# Patient Record
Sex: Female | Born: 1950
Health system: Southern US, Community
[De-identification: ages and names within clinical notes are randomized; demographics above are authoritative.]

## PROBLEM LIST (undated history)

## (undated) DIAGNOSIS — Z801 Family history of malignant neoplasm of trachea, bronchus and lung: Secondary | ICD-10-CM

## (undated) DIAGNOSIS — I509 Heart failure, unspecified: Secondary | ICD-10-CM

## (undated) DIAGNOSIS — J189 Pneumonia, unspecified organism: Secondary | ICD-10-CM

## (undated) DIAGNOSIS — J479 Bronchiectasis, uncomplicated: Secondary | ICD-10-CM

## (undated) DIAGNOSIS — Z803 Family history of malignant neoplasm of breast: Secondary | ICD-10-CM

## (undated) DIAGNOSIS — Z1371 Encounter for nonprocreative screening for genetic disease carrier status: Secondary | ICD-10-CM

## (undated) DIAGNOSIS — K5900 Constipation, unspecified: Secondary | ICD-10-CM

## (undated) DIAGNOSIS — Z800 Family history of malignant neoplasm of digestive organs: Secondary | ICD-10-CM

## (undated) DIAGNOSIS — K219 Gastro-esophageal reflux disease without esophagitis: Secondary | ICD-10-CM

## (undated) DIAGNOSIS — Z8489 Family history of other specified conditions: Secondary | ICD-10-CM

## (undated) DIAGNOSIS — C50919 Malignant neoplasm of unspecified site of unspecified female breast: Secondary | ICD-10-CM

## (undated) DIAGNOSIS — D649 Anemia, unspecified: Secondary | ICD-10-CM

## (undated) DIAGNOSIS — A64 Unspecified sexually transmitted disease: Secondary | ICD-10-CM

## (undated) DIAGNOSIS — M810 Age-related osteoporosis without current pathological fracture: Secondary | ICD-10-CM

## (undated) DIAGNOSIS — J439 Emphysema, unspecified: Secondary | ICD-10-CM

## (undated) DIAGNOSIS — J449 Chronic obstructive pulmonary disease, unspecified: Secondary | ICD-10-CM

## (undated) DIAGNOSIS — A310 Pulmonary mycobacterial infection: Secondary | ICD-10-CM

## (undated) DIAGNOSIS — I10 Essential (primary) hypertension: Secondary | ICD-10-CM

## (undated) DIAGNOSIS — C3490 Malignant neoplasm of unspecified part of unspecified bronchus or lung: Secondary | ICD-10-CM

## (undated) DIAGNOSIS — Z85118 Personal history of other malignant neoplasm of bronchus and lung: Secondary | ICD-10-CM

## (undated) HISTORY — DX: Encounter for nonprocreative screening for genetic disease carrier status: Z13.71

## (undated) HISTORY — PX: TONSILLECTOMY: SUR1361

## (undated) HISTORY — DX: Personal history of other malignant neoplasm of bronchus and lung: Z85.118

## (undated) HISTORY — DX: Anemia, unspecified: D64.9

## (undated) HISTORY — DX: Malignant neoplasm of unspecified part of unspecified bronchus or lung: C34.90

## (undated) HISTORY — DX: Pulmonary mycobacterial infection: A31.0

## (undated) HISTORY — PX: BUNIONECTOMY: SHX129

## (undated) HISTORY — DX: Gastro-esophageal reflux disease without esophagitis: K21.9

## (undated) HISTORY — DX: Malignant neoplasm of unspecified site of unspecified female breast: C50.919

## (undated) HISTORY — DX: Chronic obstructive pulmonary disease, unspecified: J44.9

## (undated) HISTORY — DX: Emphysema, unspecified: J43.9

## (undated) HISTORY — DX: Family history of malignant neoplasm of digestive organs: Z80.0

## (undated) HISTORY — PX: BREAST BIOPSY: SHX20

## (undated) HISTORY — DX: Unspecified sexually transmitted disease: A64

## (undated) HISTORY — DX: Family history of malignant neoplasm of trachea, bronchus and lung: Z80.1

## (undated) HISTORY — DX: Family history of malignant neoplasm of breast: Z80.3

## (undated) HISTORY — DX: Age-related osteoporosis without current pathological fracture: M81.0

## (undated) HISTORY — PX: COLONOSCOPY: SHX174

---
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
## 1982-07-11 HISTORY — PX: TUBAL LIGATION: SHX77

## 1999-06-07 ENCOUNTER — Other Ambulatory Visit: Admission: RE | Admit: 1999-06-07 | Discharge: 1999-06-07 | Payer: Self-pay | Admitting: Obstetrics and Gynecology

## 2000-06-08 ENCOUNTER — Other Ambulatory Visit: Admission: RE | Admit: 2000-06-08 | Discharge: 2000-06-08 | Payer: Self-pay | Admitting: Obstetrics and Gynecology

## 2001-06-04 ENCOUNTER — Other Ambulatory Visit: Admission: RE | Admit: 2001-06-04 | Discharge: 2001-06-04 | Payer: Self-pay | Admitting: Obstetrics and Gynecology

## 2002-05-06 ENCOUNTER — Encounter: Admission: RE | Admit: 2002-05-06 | Discharge: 2002-05-06 | Payer: Self-pay | Admitting: Internal Medicine

## 2002-05-06 ENCOUNTER — Encounter: Payer: Self-pay | Admitting: Internal Medicine

## 2002-06-10 ENCOUNTER — Other Ambulatory Visit: Admission: RE | Admit: 2002-06-10 | Discharge: 2002-06-10 | Payer: Self-pay | Admitting: Obstetrics and Gynecology

## 2002-09-13 ENCOUNTER — Encounter: Payer: Self-pay | Admitting: Internal Medicine

## 2002-09-13 ENCOUNTER — Encounter: Admission: RE | Admit: 2002-09-13 | Discharge: 2002-09-13 | Payer: Self-pay | Admitting: Internal Medicine

## 2002-09-19 ENCOUNTER — Encounter: Payer: Self-pay | Admitting: Internal Medicine

## 2002-09-19 ENCOUNTER — Encounter (HOSPITAL_COMMUNITY): Admission: RE | Admit: 2002-09-19 | Discharge: 2002-12-18 | Payer: Self-pay | Admitting: Internal Medicine

## 2002-09-20 ENCOUNTER — Encounter: Payer: Self-pay | Admitting: Internal Medicine

## 2003-03-19 ENCOUNTER — Encounter: Admission: RE | Admit: 2003-03-19 | Discharge: 2003-03-19 | Payer: Self-pay | Admitting: Internal Medicine

## 2003-03-19 ENCOUNTER — Encounter: Payer: Self-pay | Admitting: Internal Medicine

## 2003-04-08 ENCOUNTER — Encounter: Admission: RE | Admit: 2003-04-08 | Discharge: 2003-04-08 | Payer: Self-pay | Admitting: Internal Medicine

## 2003-04-08 ENCOUNTER — Encounter: Payer: Self-pay | Admitting: Internal Medicine

## 2003-06-12 ENCOUNTER — Other Ambulatory Visit: Admission: RE | Admit: 2003-06-12 | Discharge: 2003-06-12 | Payer: Self-pay | Admitting: Obstetrics and Gynecology

## 2003-09-10 ENCOUNTER — Encounter: Admission: RE | Admit: 2003-09-10 | Discharge: 2003-09-10 | Payer: Self-pay | Admitting: Internal Medicine

## 2004-06-17 ENCOUNTER — Other Ambulatory Visit: Admission: RE | Admit: 2004-06-17 | Discharge: 2004-06-17 | Payer: Self-pay | Admitting: *Deleted

## 2004-09-17 ENCOUNTER — Encounter: Admission: RE | Admit: 2004-09-17 | Discharge: 2004-09-17 | Payer: Self-pay | Admitting: Internal Medicine

## 2005-04-25 ENCOUNTER — Encounter: Admission: RE | Admit: 2005-04-25 | Discharge: 2005-04-25 | Payer: Self-pay | Admitting: Internal Medicine

## 2005-04-27 ENCOUNTER — Encounter: Admission: RE | Admit: 2005-04-27 | Discharge: 2005-04-27 | Payer: Self-pay | Admitting: Internal Medicine

## 2005-05-06 ENCOUNTER — Ambulatory Visit (HOSPITAL_COMMUNITY): Admission: RE | Admit: 2005-05-06 | Discharge: 2005-05-06 | Payer: Self-pay | Admitting: Internal Medicine

## 2005-06-06 DIAGNOSIS — C3490 Malignant neoplasm of unspecified part of unspecified bronchus or lung: Secondary | ICD-10-CM

## 2005-06-06 HISTORY — DX: Malignant neoplasm of unspecified part of unspecified bronchus or lung: C34.90

## 2005-06-06 HISTORY — PX: LOBECTOMY: SHX5089

## 2005-07-01 ENCOUNTER — Encounter: Admission: RE | Admit: 2005-07-01 | Discharge: 2005-07-01 | Payer: Self-pay | Admitting: Internal Medicine

## 2005-07-06 ENCOUNTER — Other Ambulatory Visit: Admission: RE | Admit: 2005-07-06 | Discharge: 2005-07-06 | Payer: Self-pay | Admitting: Obstetrics and Gynecology

## 2005-07-22 ENCOUNTER — Ambulatory Visit: Payer: Self-pay | Admitting: Oncology

## 2005-07-27 ENCOUNTER — Encounter: Admission: RE | Admit: 2005-07-27 | Discharge: 2005-07-27 | Payer: Self-pay | Admitting: Oncology

## 2005-08-23 ENCOUNTER — Encounter: Admission: RE | Admit: 2005-08-23 | Discharge: 2005-08-23 | Payer: Self-pay | Admitting: Internal Medicine

## 2005-09-20 ENCOUNTER — Encounter: Admission: RE | Admit: 2005-09-20 | Discharge: 2005-09-20 | Payer: Self-pay | Admitting: Internal Medicine

## 2005-10-31 ENCOUNTER — Ambulatory Visit: Payer: Self-pay | Admitting: Oncology

## 2005-11-03 ENCOUNTER — Ambulatory Visit (HOSPITAL_COMMUNITY): Admission: RE | Admit: 2005-11-03 | Discharge: 2005-11-03 | Payer: Self-pay | Admitting: Oncology

## 2006-01-25 ENCOUNTER — Ambulatory Visit: Payer: Self-pay | Admitting: Oncology

## 2006-01-26 LAB — CBC WITH DIFFERENTIAL (CANCER CENTER ONLY)
BASO#: 0 10*3/uL (ref 0.0–0.2)
BASO%: 0.4 % (ref 0.0–2.0)
EOS%: 1.3 % (ref 0.0–7.0)
HCT: 38.3 % (ref 34.8–46.6)
HGB: 12.9 g/dL (ref 11.6–15.9)
LYMPH#: 1 10*3/uL (ref 0.9–3.3)
LYMPH%: 22.1 % (ref 14.0–48.0)
MCH: 30.3 pg (ref 26.0–34.0)
MCHC: 33.6 g/dL (ref 32.0–36.0)
MCV: 90 fL (ref 81–101)
MONO%: 5.8 % (ref 0.0–13.0)
NEUT%: 70.4 % (ref 39.6–80.0)
RDW: 12.1 % (ref 10.5–14.6)

## 2006-01-26 LAB — COMPREHENSIVE METABOLIC PANEL WITH GFR
ALT: 17 U/L (ref 0–40)
AST: 22 U/L (ref 0–37)
Albumin: 4.4 g/dL (ref 3.5–5.2)
Alkaline Phosphatase: 50 U/L (ref 39–117)
BUN: 14 mg/dL (ref 6–23)
CO2: 26 meq/L (ref 19–32)
Calcium: 9.8 mg/dL (ref 8.4–10.5)
Chloride: 101 meq/L (ref 96–112)
Creatinine, Ser: 0.69 mg/dL (ref 0.40–1.20)
Glucose, Bld: 100 mg/dL — ABNORMAL HIGH (ref 70–99)
Potassium: 4.3 meq/L (ref 3.5–5.3)
Sodium: 136 meq/L (ref 135–145)
Total Bilirubin: 0.5 mg/dL (ref 0.3–1.2)
Total Protein: 6.8 g/dL (ref 6.0–8.3)

## 2006-01-26 LAB — LACTATE DEHYDROGENASE: LDH: 144 U/L (ref 94–250)

## 2006-04-20 ENCOUNTER — Encounter: Admission: RE | Admit: 2006-04-20 | Discharge: 2006-04-20 | Payer: Self-pay | Admitting: Oncology

## 2006-06-22 ENCOUNTER — Encounter: Admission: RE | Admit: 2006-06-22 | Discharge: 2006-06-22 | Payer: Self-pay | Admitting: Internal Medicine

## 2006-07-06 ENCOUNTER — Encounter: Admission: RE | Admit: 2006-07-06 | Discharge: 2006-07-06 | Payer: Self-pay | Admitting: Internal Medicine

## 2006-07-14 ENCOUNTER — Other Ambulatory Visit: Admission: RE | Admit: 2006-07-14 | Discharge: 2006-07-14 | Payer: Self-pay | Admitting: Obstetrics & Gynecology

## 2006-07-20 ENCOUNTER — Encounter: Admission: RE | Admit: 2006-07-20 | Discharge: 2006-07-20 | Payer: Self-pay | Admitting: Internal Medicine

## 2006-07-25 ENCOUNTER — Ambulatory Visit: Payer: Self-pay | Admitting: Oncology

## 2006-08-17 ENCOUNTER — Encounter: Admission: RE | Admit: 2006-08-17 | Discharge: 2006-08-17 | Payer: Self-pay | Admitting: Internal Medicine

## 2006-09-08 ENCOUNTER — Ambulatory Visit: Payer: Self-pay | Admitting: Oncology

## 2007-02-23 ENCOUNTER — Encounter: Admission: RE | Admit: 2007-02-23 | Discharge: 2007-02-23 | Payer: Self-pay | Admitting: Internal Medicine

## 2007-03-05 ENCOUNTER — Ambulatory Visit: Payer: Self-pay | Admitting: Oncology

## 2007-03-06 LAB — COMPREHENSIVE METABOLIC PANEL
ALT: 15 U/L (ref 0–35)
AST: 23 U/L (ref 0–37)
BUN: 15 mg/dL (ref 6–23)
CO2: 26 mEq/L (ref 19–32)
Creatinine, Ser: 0.68 mg/dL (ref 0.40–1.20)
Total Bilirubin: 0.6 mg/dL (ref 0.3–1.2)

## 2007-03-06 LAB — CBC WITH DIFFERENTIAL (CANCER CENTER ONLY)
BASO#: 0 10*3/uL (ref 0.0–0.2)
BASO%: 0.4 % (ref 0.0–2.0)
EOS%: 6.7 % (ref 0.0–7.0)
LYMPH#: 1.5 10*3/uL (ref 0.9–3.3)
MCH: 30.9 pg (ref 26.0–34.0)
MCHC: 34.5 g/dL (ref 32.0–36.0)
MONO%: 8.2 % (ref 0.0–13.0)
NEUT#: 1.6 10*3/uL (ref 1.5–6.5)
Platelets: 246 10*3/uL (ref 145–400)
RDW: 11.3 % (ref 10.5–14.6)

## 2007-03-08 ENCOUNTER — Ambulatory Visit (HOSPITAL_COMMUNITY): Admission: RE | Admit: 2007-03-08 | Discharge: 2007-03-08 | Payer: Self-pay | Admitting: Oncology

## 2007-03-16 ENCOUNTER — Ambulatory Visit (HOSPITAL_COMMUNITY): Admission: RE | Admit: 2007-03-16 | Discharge: 2007-03-16 | Payer: Self-pay | Admitting: Oncology

## 2007-04-26 ENCOUNTER — Encounter: Admission: RE | Admit: 2007-04-26 | Discharge: 2007-04-26 | Payer: Self-pay | Admitting: Obstetrics and Gynecology

## 2007-07-17 ENCOUNTER — Other Ambulatory Visit: Admission: RE | Admit: 2007-07-17 | Discharge: 2007-07-17 | Payer: Self-pay | Admitting: Obstetrics and Gynecology

## 2008-03-13 ENCOUNTER — Ambulatory Visit: Payer: Self-pay | Admitting: Oncology

## 2008-03-18 LAB — CBC WITH DIFFERENTIAL (CANCER CENTER ONLY)
BASO#: 0 10*3/uL (ref 0.0–0.2)
BASO%: 0.2 % (ref 0.0–2.0)
HCT: 35.7 % (ref 34.8–46.6)
HGB: 12.3 g/dL (ref 11.6–15.9)
LYMPH%: 31.9 % (ref 14.0–48.0)
MCV: 91 fL (ref 81–101)
MONO#: 0.3 10*3/uL (ref 0.1–0.9)
NEUT%: 51.7 % (ref 39.6–80.0)
RDW: 11.5 % (ref 10.5–14.6)
WBC: 3.6 10*3/uL — ABNORMAL LOW (ref 3.9–10.0)

## 2008-03-18 LAB — COMPREHENSIVE METABOLIC PANEL
BUN: 18 mg/dL (ref 6–23)
CO2: 29 mEq/L (ref 19–32)
Calcium: 9.9 mg/dL (ref 8.4–10.5)
Chloride: 101 mEq/L (ref 96–112)
Creatinine, Ser: 0.66 mg/dL (ref 0.40–1.20)
Glucose, Bld: 97 mg/dL (ref 70–99)
Total Bilirubin: 0.5 mg/dL (ref 0.3–1.2)

## 2008-03-18 LAB — LACTATE DEHYDROGENASE: LDH: 154 U/L (ref 94–250)

## 2008-03-20 ENCOUNTER — Ambulatory Visit (HOSPITAL_COMMUNITY): Admission: RE | Admit: 2008-03-20 | Discharge: 2008-03-20 | Payer: Self-pay | Admitting: Oncology

## 2008-04-28 ENCOUNTER — Encounter: Admission: RE | Admit: 2008-04-28 | Discharge: 2008-04-28 | Payer: Self-pay | Admitting: Obstetrics and Gynecology

## 2008-05-08 ENCOUNTER — Ambulatory Visit: Payer: Self-pay | Admitting: Internal Medicine

## 2008-06-26 ENCOUNTER — Ambulatory Visit: Payer: Self-pay | Admitting: Internal Medicine

## 2008-08-05 ENCOUNTER — Other Ambulatory Visit: Admission: RE | Admit: 2008-08-05 | Discharge: 2008-08-05 | Payer: Self-pay | Admitting: Obstetrics & Gynecology

## 2008-09-18 ENCOUNTER — Ambulatory Visit: Payer: Self-pay | Admitting: Internal Medicine

## 2009-04-29 ENCOUNTER — Encounter: Admission: RE | Admit: 2009-04-29 | Discharge: 2009-04-29 | Payer: Self-pay | Admitting: Obstetrics and Gynecology

## 2009-05-06 ENCOUNTER — Ambulatory Visit: Payer: Self-pay | Admitting: Internal Medicine

## 2009-06-22 ENCOUNTER — Encounter: Admission: RE | Admit: 2009-06-22 | Discharge: 2009-06-22 | Payer: Self-pay | Admitting: Internal Medicine

## 2009-07-06 ENCOUNTER — Encounter: Admission: RE | Admit: 2009-07-06 | Discharge: 2009-07-06 | Payer: Self-pay | Admitting: Internal Medicine

## 2009-07-06 ENCOUNTER — Ambulatory Visit: Payer: Self-pay | Admitting: Internal Medicine

## 2009-07-11 DIAGNOSIS — Z1371 Encounter for nonprocreative screening for genetic disease carrier status: Secondary | ICD-10-CM

## 2009-07-11 HISTORY — DX: Encounter for nonprocreative screening for genetic disease carrier status: Z13.71

## 2009-08-11 DIAGNOSIS — C50919 Malignant neoplasm of unspecified site of unspecified female breast: Secondary | ICD-10-CM

## 2009-08-11 HISTORY — DX: Malignant neoplasm of unspecified site of unspecified female breast: C50.919

## 2009-09-02 ENCOUNTER — Encounter: Admission: RE | Admit: 2009-09-02 | Discharge: 2009-09-02 | Payer: Self-pay | Admitting: Obstetrics & Gynecology

## 2009-09-09 ENCOUNTER — Encounter: Admission: RE | Admit: 2009-09-09 | Discharge: 2009-09-09 | Payer: Self-pay | Admitting: Obstetrics & Gynecology

## 2009-09-17 ENCOUNTER — Encounter: Admission: RE | Admit: 2009-09-17 | Discharge: 2009-09-17 | Payer: Self-pay | Admitting: Surgery

## 2009-09-25 ENCOUNTER — Ambulatory Visit: Payer: Self-pay | Admitting: Oncology

## 2009-09-29 ENCOUNTER — Ambulatory Visit: Payer: Self-pay | Admitting: Internal Medicine

## 2009-09-30 LAB — CBC WITH DIFFERENTIAL (CANCER CENTER ONLY)
BASO#: 0 10*3/uL (ref 0.0–0.2)
BASO%: 0.3 % (ref 0.0–2.0)
EOS%: 5.8 % (ref 0.0–7.0)
Eosinophils Absolute: 0.2 10*3/uL (ref 0.0–0.5)
MCH: 31.3 pg (ref 26.0–34.0)
MCV: 90 fL (ref 81–101)
Platelets: 228 10*3/uL (ref 145–400)
RDW: 11.8 % (ref 10.5–14.6)

## 2009-09-30 LAB — CMP (CANCER CENTER ONLY)
AST: 26 U/L (ref 11–38)
BUN, Bld: 12 mg/dL (ref 7–22)
CO2: 28 mEq/L (ref 18–33)
Calcium: 9.8 mg/dL (ref 8.0–10.3)
Chloride: 100 mEq/L (ref 98–108)
Sodium: 135 mEq/L (ref 128–145)

## 2009-10-12 ENCOUNTER — Encounter: Admission: RE | Admit: 2009-10-12 | Discharge: 2009-10-12 | Payer: Self-pay | Admitting: Surgery

## 2009-10-12 ENCOUNTER — Ambulatory Visit (HOSPITAL_BASED_OUTPATIENT_CLINIC_OR_DEPARTMENT_OTHER): Admission: RE | Admit: 2009-10-12 | Discharge: 2009-10-12 | Payer: Self-pay | Admitting: Surgery

## 2009-10-12 HISTORY — PX: BREAST LUMPECTOMY: SHX2

## 2009-10-16 ENCOUNTER — Ambulatory Visit: Admission: RE | Admit: 2009-10-16 | Discharge: 2010-01-14 | Payer: Self-pay | Admitting: Radiation Oncology

## 2009-10-28 ENCOUNTER — Ambulatory Visit: Payer: Self-pay | Admitting: Oncology

## 2009-11-03 LAB — CMP (CANCER CENTER ONLY)
Calcium: 9.6 mg/dL (ref 8.0–10.3)
Creat: 0.7 mg/dl (ref 0.6–1.2)
Potassium: 4.6 mEq/L (ref 3.3–4.7)
Sodium: 137 mEq/L (ref 128–145)
Total Bilirubin: 0.7 mg/dl (ref 0.20–1.60)

## 2009-11-03 LAB — CBC WITH DIFFERENTIAL (CANCER CENTER ONLY)
BASO#: 0 10*3/uL (ref 0.0–0.2)
BASO%: 0.5 % (ref 0.0–2.0)
HGB: 12.1 g/dL (ref 11.6–15.9)
LYMPH#: 1.3 10*3/uL (ref 0.9–3.3)
LYMPH%: 34.2 % (ref 14.0–48.0)
MCHC: 34.8 g/dL (ref 32.0–36.0)
MCV: 90 fL (ref 81–101)
MONO#: 0.2 10*3/uL (ref 0.1–0.9)
MONO%: 5.8 % (ref 0.0–13.0)
NEUT#: 1.9 10*3/uL (ref 1.5–6.5)
NEUT%: 52.5 % (ref 39.6–80.0)
RDW: 12.2 % (ref 10.5–14.6)

## 2009-11-20 ENCOUNTER — Ambulatory Visit: Payer: Self-pay | Admitting: Internal Medicine

## 2010-01-14 ENCOUNTER — Ambulatory Visit: Admission: RE | Admit: 2010-01-14 | Discharge: 2010-01-31 | Payer: Self-pay | Admitting: Radiation Oncology

## 2010-01-29 ENCOUNTER — Ambulatory Visit: Payer: Self-pay | Admitting: Oncology

## 2010-02-03 LAB — CMP (CANCER CENTER ONLY)
ALT(SGPT): 22 U/L (ref 10–47)
AST: 25 U/L (ref 11–38)
Alkaline Phosphatase: 67 U/L (ref 26–84)
BUN, Bld: 14 mg/dL (ref 7–22)
Creat: 0.8 mg/dl (ref 0.6–1.2)
Potassium: 4.7 mEq/L (ref 3.3–4.7)

## 2010-02-03 LAB — CBC WITH DIFFERENTIAL (CANCER CENTER ONLY)
BASO%: 0.6 % (ref 0.0–2.0)
EOS%: 2.9 % (ref 0.0–7.0)
HCT: 35.1 % (ref 34.8–46.6)
HGB: 12.2 g/dL (ref 11.6–15.9)
LYMPH#: 0.7 10*3/uL — ABNORMAL LOW (ref 0.9–3.3)
MCH: 31.8 pg (ref 26.0–34.0)
MCV: 91 fL (ref 81–101)
MONO%: 9.6 % (ref 0.0–13.0)
NEUT#: 1.9 10*3/uL (ref 1.5–6.5)
NEUT%: 62.6 % (ref 39.6–80.0)
RBC: 3.85 10*6/uL (ref 3.70–5.32)
RDW: 12.1 % (ref 10.5–14.6)
WBC: 3 10*3/uL — ABNORMAL LOW (ref 3.9–10.0)

## 2010-02-22 ENCOUNTER — Ambulatory Visit: Payer: Self-pay | Admitting: Internal Medicine

## 2010-03-17 ENCOUNTER — Ambulatory Visit: Payer: Self-pay | Admitting: Oncology

## 2010-03-23 LAB — MANUAL DIFFERENTIAL (CHCC SATELLITE)
ALC: 0.7 10*3/uL (ref 0.6–2.2)
ANC (CHCC HP manual diff): 1.3 10*3/uL — ABNORMAL LOW (ref 1.5–6.7)
Band Neutrophils: 2 % (ref 0–10)
LYMPH: 26 % (ref 14–48)
Metamyelocytes: 1 % — ABNORMAL HIGH (ref 0–0)
SEG: 48 % (ref 40–75)

## 2010-03-23 LAB — CBC WITH DIFFERENTIAL (CANCER CENTER ONLY)
HCT: 38.3 % (ref 34.8–46.6)
HGB: 12.9 g/dL (ref 11.6–15.9)
MCH: 31.4 pg (ref 26.0–34.0)
MCHC: 33.8 g/dL (ref 32.0–36.0)
MCV: 93 fL (ref 81–101)
Platelets: 223 10*3/uL (ref 145–400)
WBC: 2.6 10*3/uL — ABNORMAL LOW (ref 3.9–10.0)

## 2010-03-23 LAB — CMP (CANCER CENTER ONLY)
ALT(SGPT): 26 U/L (ref 10–47)
Albumin: 4 g/dL (ref 3.3–5.5)
CO2: 29 mEq/L (ref 18–33)
Calcium: 9.7 mg/dL (ref 8.0–10.3)
Chloride: 99 mEq/L (ref 98–108)
Creat: 0.7 mg/dl (ref 0.6–1.2)
Glucose, Bld: 95 mg/dL (ref 73–118)
Sodium: 137 mEq/L (ref 128–145)

## 2010-04-15 ENCOUNTER — Ambulatory Visit: Payer: Self-pay | Admitting: Internal Medicine

## 2010-06-10 ENCOUNTER — Ambulatory Visit: Payer: Self-pay | Admitting: Internal Medicine

## 2010-06-10 ENCOUNTER — Encounter: Admission: RE | Admit: 2010-06-10 | Discharge: 2010-06-10 | Payer: Self-pay | Admitting: Internal Medicine

## 2010-06-28 ENCOUNTER — Encounter
Admission: RE | Admit: 2010-06-28 | Discharge: 2010-06-28 | Payer: Self-pay | Source: Home / Self Care | Attending: Internal Medicine | Admitting: Internal Medicine

## 2010-07-21 ENCOUNTER — Encounter: Payer: Self-pay | Admitting: Internal Medicine

## 2010-07-31 ENCOUNTER — Other Ambulatory Visit: Payer: Self-pay | Admitting: Internal Medicine

## 2010-07-31 DIAGNOSIS — Z9889 Other specified postprocedural states: Secondary | ICD-10-CM

## 2010-08-01 ENCOUNTER — Encounter: Payer: Self-pay | Admitting: Oncology

## 2010-08-01 ENCOUNTER — Encounter: Payer: Self-pay | Admitting: Internal Medicine

## 2010-08-12 NOTE — Letter (Signed)
Summary: Colonoscopy Letter   Gastroenterology  [address] [place], [place] [address]   Phone: [phone]  Fax: [phone]      July 21, 2010 MRN: [id number]   [facility] [address] [place], [place]  [address]   Dear Anna Valentine,   According to your medical record, it is time for you to schedule a Colonoscopy. The American Cancer Society recommends this procedure as a method to detect early colon cancer. Patients with a family history of colon cancer, or a personal history of colon polyps or inflammatory bowel disease are at increased risk.  This letter has been generated based on the recommendations made at the time of your procedure. If you feel that in your particular situation this may no longer apply, please contact our office.  Please call our office at [phone] to schedule this appointment or to update your records at your earliest convenience.  Thank you for cooperating with [place] to provide you with the very best care possible.   Sincerely,  Hedwig Morton. Juanda Chance, M.D.  [facility]astro[facility]ology [facility] [phone]

## 2010-08-26 ENCOUNTER — Encounter (INDEPENDENT_AMBULATORY_CARE_PROVIDER_SITE_OTHER): Payer: Self-pay | Admitting: *Deleted

## 2010-08-26 ENCOUNTER — Other Ambulatory Visit: Payer: Self-pay | Admitting: Internal Medicine

## 2010-08-26 ENCOUNTER — Ambulatory Visit: Payer: BC Managed Care – PPO | Attending: Radiation Oncology | Admitting: Radiation Oncology

## 2010-08-27 ENCOUNTER — Ambulatory Visit: Payer: Self-pay | Admitting: Internal Medicine

## 2010-08-27 ENCOUNTER — Encounter: Payer: Self-pay | Admitting: Internal Medicine

## 2010-08-30 ENCOUNTER — Ambulatory Visit (INDEPENDENT_AMBULATORY_CARE_PROVIDER_SITE_OTHER): Payer: BC Managed Care – PPO | Admitting: Internal Medicine

## 2010-08-30 DIAGNOSIS — E785 Hyperlipidemia, unspecified: Secondary | ICD-10-CM

## 2010-08-30 DIAGNOSIS — J069 Acute upper respiratory infection, unspecified: Secondary | ICD-10-CM

## 2010-09-01 NOTE — Letter (Signed)
Summary: Miralax Instructions  [facility]  [address]   [place], [place] [address]   Phone: [phone]  Fax: [phone]       [facility]    Nov 15, 1950    MRN: [id number]       Procedure Day Dorna Bloom: Friday, 09-10-10     Arrival Time: 7:30 a.m.     Procedure Time: 8:00 a.m.     Location of Procedure:                    x   Endoscopy Center (4th Floor)   PREPARATION FOR COLONOSCOPY WITH MIRALAX  Starting 5 days prior to your procedure 09-05-10 do not eat nuts, seeds, popcorn, corn, beans, peas,  salads, or any raw vegetables.  Do not take any fiber supplements (e.g. Metamucil, Citrucel, and Benefiber). ____________________________________________________________________________________________________   THE DAY BEFORE YOUR PROCEDURE         DATE: 09-09-10 DAY: Thursday  1   Drink clear liquids the entire day-NO SOLID FOOD  2   Do not drink anything colored red or purple.  Avoid juices with pulp.  No orange juice.  3   Drink at least 64 oz. (8 glasses) of fluid/clear liquids during the day to prevent dehydration and help the prep work efficiently.  CLEAR LIQUIDS INCLUDE: Water Jello Ice Popsicles Tea (sugar ok, no milk/cream) Powdered fruit flavored drinks Coffee (sugar ok, no milk/cream) Gatorade Juice: apple, white grape, white cranberry  Lemonade Clear bullion, consomm, broth Carbonated beverages (any kind) Strained chicken noodle soup Hard Candy  4   Mix the entire bottle of Miralax with 64 oz. of Gatorade/Powerade in the morning and put in the refrigerator to chill.  5   At 3:00 pm take 2 Dulcolax/Bisacodyl tablets.  6   At 4:30 pm take one Reglan/Metoclopramide tablet.  7  Starting at 5:00 pm drink one 8 oz glass of the Miralax mixture every 15-20 minutes until you have finished drinking the entire 64 oz.  You should finish drinking prep around 7:30 or 8:00 pm.  8   If you are nauseated, you may take the 2nd Reglan/Metoclopramide  tablet at 6:30 pm.        9    At 8:00 pm take 2 more DULCOLAX/Bisacodyl tablets.   THE DAY OF YOUR PROCEDURE      DATE:  09-10-10 DAY: Friday  You may drink clear liquids until 6:00 a.m. (2 HOURS BEFORE PROCEDURE).   MEDICATION INSTRUCTIONS  Unless otherwise instructed, you should take regular prescription medications with a small sip of water as early as possible the morning of your procedure.         OTHER INSTRUCTIONS  You will need a responsible adult at least 59 years of age to accompany you and drive you home.   This person must remain in the waiting room during your procedure.  Wear loose fitting clothing that is easily removed.  Leave jewelry and other valuables at home.  However, you may wish to bring a book to read or an iPod/MP3 player to listen to music as you wait for your procedure to start.  Remove all body piercing jewelry and leave at home.  Total time from sign-in until discharge is approximately 2-3 hours.  You should go home directly after your procedure and rest.  You can resume normal activities the day after your procedure.  The day of your procedure you should not:   Drive   Make legal decisions   Operate  machinery   Drink alcohol   Return to work  You will receive specific instructions about eating, activities and medications before you leave.   The above instructions have been reviewed and explained to me by   Durwin Glaze RN  August 27, 2010 2:23 PM     I fully understand and can verbalize these instructions _____________________________ Date _______

## 2010-09-01 NOTE — Miscellaneous (Signed)
Summary: lec pv/kco  Clinical Lists Changes  Medications: Added new medication of MIRALAX   POWD (POLYETHYLENE GLYCOL 3350) As per prep  instructions. - Signed Added new medication of REGLAN 10 MG  TABS (METOCLOPRAMIDE HCL) As per prep instructions. - Signed Added new medication of DULCOLAX 5 MG  TBEC (BISACODYL) Day before procedure take 2 at 3pm and 2 at 8pm. - Signed Rx of MIRALAX   POWD (POLYETHYLENE GLYCOL 3350) As per prep  instructions.;  #255gm x 0;  Signed;  Entered by: Durwin Glaze RN;  Authorized by: Hart Carwin MD;  Method used: Electronically to [facility] [address] [place], [place], [place]  [address], Ph: [phone], Fax: [phone] Rx of REGLAN 10 MG  TABS (METOCLOPRAMIDE HCL) As per prep instructions.;  #2 x 0;  Signed;  Entered by: Durwin Glaze RN;  Authorized by: Hart Carwin MD;  Method used: Electronically to [facility] [address] [place], [place], [place]  [address], Ph: [phone], Fax: [phone] Rx of DULCOLAX 5 MG  TBEC (BISACODYL) Day before procedure take 2 at 3pm and 2 at 8pm.;  #4 x 0;  Signed;  Entered by: Durwin Glaze RN;  Authorized by: Hart Carwin MD;  Method used: Electronically to [facility] [address], [place], [place], [place]  [address], Ph: [phone], Fax: [phone] Allergies: Added new allergy or adverse reaction of * CLARITHROMYCIN Observations: Added new observation of NKA: F (08/27/2010 14:03)    Prescriptions: DULCOLAX 5 MG  TBEC (BISACODYL) Day before procedure take 2 at 3pm and 2 at 8pm.  #4 x 0   Entered by:   Durwin Glaze RN   Authorized by:   Hart Carwin MD   Signed by:   Durwin Glaze RN on 08/27/2010   Method used:   Electronically to        [facility] ([facility])       [address]       [place], [place]  [address]       Ph: [phone]       Fax: [phone]   RxID:   [id number] REGLAN 10 MG  TABS  (METOCLOPRAMIDE HCL) As per prep instructions.  #2 x 0   Entered by:   Durwin Glaze RN   Authorized by:   Hart Carwin MD   Signed by:   Durwin Glaze RN on 08/27/2010   Method used:   Electronically to        [facility] ([facility])       [address]       [place], [place]  [address]       Ph: [phone]       Fax: [phone]   RxID:   [id number] MIRALAX   POWD (POLYETHYLENE GLYCOL 3350) As per prep  instructions.  #255gm x 0   Entered by:   Durwin Glaze RN   Authorized by:   Hart Carwin MD   Signed by:   Durwin Glaze RN on 08/27/2010   Method used:   Electronically to        [facility] ([facility])       [address]       [place], [place]  [address]       Ph: [phone]  Fax: [phone]   RxID:   [id number]

## 2010-09-06 ENCOUNTER — Ambulatory Visit
Admission: RE | Admit: 2010-09-06 | Discharge: 2010-09-06 | Disposition: A | Discharge status: Home / Self Care | Payer: BC Managed Care – PPO | Source: Ambulatory Visit | Attending: Internal Medicine | Admitting: Internal Medicine

## 2010-09-06 ENCOUNTER — Other Ambulatory Visit: Payer: Self-pay | Admitting: Internal Medicine

## 2010-09-06 DIAGNOSIS — Z9889 Other specified postprocedural states: Secondary | ICD-10-CM

## 2010-09-10 ENCOUNTER — Other Ambulatory Visit (AMBULATORY_SURGERY_CENTER): Payer: BC Managed Care – PPO | Admitting: Internal Medicine

## 2010-09-10 ENCOUNTER — Encounter: Payer: Self-pay | Admitting: Internal Medicine

## 2010-09-10 DIAGNOSIS — Z1211 Encounter for screening for malignant neoplasm of colon: Secondary | ICD-10-CM

## 2010-09-10 DIAGNOSIS — K5730 Diverticulosis of large intestine without perforation or abscess without bleeding: Secondary | ICD-10-CM

## 2010-09-10 DIAGNOSIS — Z800 Family history of malignant neoplasm of digestive organs: Secondary | ICD-10-CM

## 2010-09-16 NOTE — Procedures (Addendum)
Summary: Colonoscopy  Patient: [name] Note: All result statuses are Final unless otherwise noted.  Tests: (1) Colonoscopy (COL)   COL Colonoscopy           DONE      Endoscopy Center     [address]     [place], [place]  [address]          COLONOSCOPY PROCEDURE REPORT          PATIENT:  Anna Valentine, Anna Valentine  MR#:  [id number]     BIRTHDATE:  13-Jul-1950, 60 yrs. old  GENDER:  female     ENDOSCOPIST:  Hedwig Morton. Juanda Chance, MD     REF. BY:  Sharlet Salina, M.D.     PROCEDURE DATE:  09/10/2010     PROCEDURE:  Colonoscopy 78469     ASA CLASS:  Class II     INDICATIONS:  family history of colon cancer father with colon     cancer,     personal hx of lung cancer and breast cancer     last colon 2005 ( 1995, 1999)     MEDICATIONS:   Versed 10 mg, Fentanyl 100 mcg, Benadryl 12.5 mg          DESCRIPTION OF PROCEDURE:   After the risks benefits and     alternatives of the procedure were thoroughly explained, informed     consent was obtained.  Digital rectal exam was performed and     revealed no rectal masses.   The LB [id number]-[id number] endoscope     was introduced through the anus and advanced to the cecum, which     was identified by both the appendix and ileocecal valve, without     limitations.  The quality of the prep was good, using MiraLax.     The instrument was then slowly withdrawn as the colon was fully     examined.     <<PROCEDUREIMAGES>>          FINDINGS:  Mild diverticulosis was found. minimal diverticulosis     This was otherwise a normal examination of the colon (see image4,     image3, image2, and image1).   Retroflexed views in the rectum     revealed no abnormalities.    The scope was then withdrawn from     the patient and the procedure completed.          COMPLICATIONS:  None     ENDOSCOPIC IMPRESSION:     1) Mild diverticulosis     2) Otherwise normal examination     3) Normal colonoscopy     RECOMMENDATIONS:     1) high fiber diet     REPEAT  EXAM:  In 5 year(s) for.          ______________________________     Hedwig Morton. Juanda Chance, MD          CC:          n.     eSIGNED:   Hedwig Morton. Pantelis Elgersma at 09/10/2010 08:44 AM          Clarisa Fling, [id number]  Note: An exclamation mark (!) indicates a result that was not dispersed into the flowsheet. Document Creation Date: 09/10/2010 8:44 AM _______________________________________________________________________  (1) Order result status: Final Collection or observation date-time: 09/10/2010 08:36 Requested date-time:  Receipt date-time:  Reported date-time:  Referring Physician:   Ordering Physician: Lina Sar [phone]) Specimen Source:  Source: Launa Grill Order Number: [id number] Lab site:  Appended Document: Colonoscopy    Clinical Lists Changes  Observations: Added new observation of COLONNXTDUE: 09/2015 (09/10/2010 12:16)

## 2010-09-21 ENCOUNTER — Other Ambulatory Visit: Payer: Self-pay | Admitting: Surgery

## 2010-09-21 DIAGNOSIS — R922 Inconclusive mammogram: Secondary | ICD-10-CM

## 2010-09-21 DIAGNOSIS — Z9889 Other specified postprocedural states: Secondary | ICD-10-CM

## 2010-09-29 LAB — POCT HEMOGLOBIN-HEMACUE: Hemoglobin: 13.4 g/dL (ref 12.0–15.0)

## 2010-10-01 ENCOUNTER — Other Ambulatory Visit: Payer: BC Managed Care – PPO

## 2010-10-02 ENCOUNTER — Ambulatory Visit
Admission: RE | Admit: 2010-10-02 | Discharge: 2010-10-02 | Disposition: A | Discharge status: Home / Self Care | Payer: BC Managed Care – PPO | Source: Ambulatory Visit | Attending: Surgery | Admitting: Surgery

## 2010-10-02 DIAGNOSIS — R922 Inconclusive mammogram: Secondary | ICD-10-CM

## 2010-10-02 DIAGNOSIS — Z9889 Other specified postprocedural states: Secondary | ICD-10-CM

## 2010-10-02 MED ORDER — GADOBENATE DIMEGLUMINE 529 MG/ML IV SOLN
11.0000 mL | Freq: Once | INTRAVENOUS | Status: AC | PRN
  Administered 2010-10-02: 11 mL via INTRAVENOUS

## 2010-10-04 LAB — DIFFERENTIAL
Eosinophils Absolute: 0.3 10*3/uL (ref 0.0–0.7)
Lymphs Abs: 1.3 10*3/uL (ref 0.7–4.0)
Monocytes Relative: 10 % (ref 3–12)
Neutrophils Relative %: 50 % (ref 43–77)

## 2010-10-04 LAB — BASIC METABOLIC PANEL
Chloride: 102 mEq/L (ref 96–112)
GFR calc Af Amer: >60 mL/min (ref >60)
Potassium: 3.7 mEq/L (ref 3.5–5.1)
Sodium: 136 mEq/L (ref 135–145)

## 2010-10-04 LAB — CBC
HCT: 36.9 % (ref 36.0–46.0)
Hemoglobin: 12.4 g/dL (ref 12.0–15.0)
MCV: 94.3 fL (ref 78.0–100.0)
RBC: 3.91 MIL/uL (ref 3.87–5.11)
WBC: 4.1 10*3/uL (ref 4.0–10.5)

## 2010-10-22 ENCOUNTER — Ambulatory Visit (INDEPENDENT_AMBULATORY_CARE_PROVIDER_SITE_OTHER): Payer: BC Managed Care – PPO | Admitting: Internal Medicine

## 2010-10-22 DIAGNOSIS — Z85118 Personal history of other malignant neoplasm of bronchus and lung: Secondary | ICD-10-CM

## 2010-10-22 DIAGNOSIS — J479 Bronchiectasis, uncomplicated: Secondary | ICD-10-CM

## 2010-11-15 ENCOUNTER — Encounter: Payer: Self-pay | Admitting: Pulmonary Disease

## 2010-11-15 ENCOUNTER — Ambulatory Visit (INDEPENDENT_AMBULATORY_CARE_PROVIDER_SITE_OTHER): Payer: BC Managed Care – PPO | Admitting: Pulmonary Disease

## 2010-11-15 ENCOUNTER — Other Ambulatory Visit: Payer: BC Managed Care – PPO

## 2010-11-15 DIAGNOSIS — R05 Cough: Secondary | ICD-10-CM | POA: Insufficient documentation

## 2010-11-15 DIAGNOSIS — J479 Bronchiectasis, uncomplicated: Secondary | ICD-10-CM

## 2010-11-15 DIAGNOSIS — R059 Cough, unspecified: Secondary | ICD-10-CM

## 2010-11-15 DIAGNOSIS — C50919 Malignant neoplasm of unspecified site of unspecified female breast: Secondary | ICD-10-CM

## 2010-11-15 NOTE — Progress Notes (Signed)
  Subjective:    Patient ID: Anna Valentine, female    DOB: 04/04/1951, 60 y.o.   MRN: [id number]  HPI The pt is a 60 y/o female who I have been asked to see for an abnormal ct chest.  She has a h/o stage 1 lung cancer that has been followed at [facility], and a recent ct chest showed bronchiectasis on right with scarring and pleural thickening bilat.  There were also centrilobular nodules and tree in bud opacities suggestive of bacterial or atypical colonization.  The pt describes a cough that started last fall after a "cold".  She has been treated with multiple rounds of abx, with only some improvement.  The cough is primarily dry, with occasionally discolored mucus.  She describes "rattling in throat" at times.  She has been having a lot of postnasal drip, but denies reflux symptoms.  She denies dyspnea issues, stating that she has no problem with ADL's, and has stable tolerance with her running.  It is interesting that she lived in [place], and her lung biopsy in 2006 at the time of her lobectomy also showed necrotizing granulomatous inflammation.  She denies any h/o histoplamosis or TB exposure.  Her PPD has been negative in the distant past.  She denies any early family h/o emphysema.    Review of Systems  Constitutional: Negative for fever and unexpected weight change.  HENT: Positive for sneezing. Negative for ear pain, nosebleeds, congestion, sore throat, rhinorrhea, trouble swallowing, dental problem, postnasal drip and sinus pressure.   Eyes: Negative for redness and itching.  Respiratory: Positive for cough and shortness of breath. Negative for chest tightness and wheezing.   Cardiovascular: Negative for palpitations and leg swelling.  Gastrointestinal: Negative for nausea and vomiting.  Genitourinary: Negative for dysuria.  Musculoskeletal: Negative for joint swelling.  Skin: Negative for rash.  Neurological: Negative for headaches.  Hematological: Does not bruise/bleed easily.    Psychiatric/Behavioral: Negative for dysphoric mood. The patient is not nervous/anxious.        Objective:   Physical Exam Constitutional:  Well developed, no acute distress  HENT:  Nares patent without discharge  Oropharynx without exudate, palate and uvula are normal  Eyes:  Perrla, eomi, no scleral icterus  Neck:  No JVD, no TMG  Cardiovascular:  Normal rate, regular rhythm, no rubs or gallops.  No murmurs        Intact distal pulses  Pulmonary :  Decreased bs on right, no stridor or respiratory distress   No rales, rhonchi, or wheezing  Abdominal:  Soft, nondistended, bowel sounds present.  No tenderness noted.   Musculoskeletal:  No lower extremity edema noted.  Lymph Nodes:  No cervical lymphadenopathy noted  Skin:  No cyanosis noted  Neurologic:  Alert, appropriate, moves all 4 extremities without obvious deficit.         Assessment & Plan:

## 2010-11-15 NOTE — Patient Instructions (Signed)
Will schedule for breathing studies, and will see you back same day for review Take chlorpheniramine 8mg  otc each night at bedtime Qnasal 2 sprays each nostril each am Avoid throat clearing and overuse of voice

## 2010-11-16 ENCOUNTER — Encounter (INDEPENDENT_AMBULATORY_CARE_PROVIDER_SITE_OTHER): Payer: Self-pay | Admitting: Surgery

## 2010-11-21 ENCOUNTER — Encounter: Payer: Self-pay | Admitting: Pulmonary Disease

## 2010-11-21 DIAGNOSIS — Z853 Personal history of malignant neoplasm of breast: Secondary | ICD-10-CM | POA: Insufficient documentation

## 2010-11-21 NOTE — Assessment & Plan Note (Signed)
The pt has bronchiectasis on her recent and past ct chest, but really does not have any chronic cough or mucus/congestion until most recently.  She denies recurrent pulmonary infections.  It is unclear if her current cough is even related to this finding, but will need to keep in mind.  The tree in bud nodularity is usually associated with chronic bacterial or atypical mycobacterial colonization.   I think she needs to have pfts to r/o airflow obstruction that is frequently associated with bronchiectasis, and will also check AAT given her chronic lung disease.  Will treat her cough as an upper airway issue for now, but will readdress once her pfts are done.

## 2010-11-21 NOTE — Assessment & Plan Note (Signed)
A lot of her symptoms currently suggest an upper airway cough > lower airway source.  She is having a lot of postnasal drip, and would like to treat for this aggressively while we are waiting on her to get full pft's.  She is agreeable to this approach.

## 2010-11-23 ENCOUNTER — Other Ambulatory Visit: Payer: Self-pay | Admitting: Oncology

## 2010-11-23 ENCOUNTER — Encounter (HOSPITAL_BASED_OUTPATIENT_CLINIC_OR_DEPARTMENT_OTHER): Payer: BC Managed Care – PPO | Admitting: Oncology

## 2010-11-23 DIAGNOSIS — C50219 Malignant neoplasm of upper-inner quadrant of unspecified female breast: Secondary | ICD-10-CM

## 2010-11-23 DIAGNOSIS — C3490 Malignant neoplasm of unspecified part of unspecified bronchus or lung: Secondary | ICD-10-CM

## 2010-11-23 DIAGNOSIS — Z170 Estrogen receptor positive status [ER+]: Secondary | ICD-10-CM

## 2010-11-23 LAB — CBC WITH DIFFERENTIAL/PLATELET
BASO%: 0.4 % (ref 0.0–2.0)
HCT: 34.9 % (ref 34.8–46.6)
MCHC: 34 g/dL (ref 31.5–36.0)
MONO#: 0.4 10*3/uL (ref 0.1–0.9)
RBC: 3.88 10*6/uL (ref 3.70–5.45)
RDW: 13.6 % (ref 11.2–14.5)
WBC: 4.2 10*3/uL (ref 3.9–10.3)
lymph#: 0.7 10*3/uL — ABNORMAL LOW (ref 0.9–3.3)

## 2010-11-23 LAB — COMPREHENSIVE METABOLIC PANEL
ALT: 12 U/L (ref 0–35)
CO2: 25 mEq/L (ref 19–32)
Calcium: 9.5 mg/dL (ref 8.4–10.5)
Chloride: 100 mEq/L (ref 96–112)
Creatinine, Ser: 0.64 mg/dL (ref 0.40–1.20)
Total Protein: 6.5 g/dL (ref 6.0–8.3)

## 2010-11-24 ENCOUNTER — Telehealth: Payer: Self-pay | Admitting: Pulmonary Disease

## 2010-11-24 LAB — ALPHA-1 ANTITRYPSIN PHENOTYPE

## 2010-11-24 NOTE — Telephone Encounter (Signed)
Called and spoke with Brad from [facility] and was informed that the Alpha 1 is still pending. The results are not ready yet.

## 2010-11-24 NOTE — Telephone Encounter (Signed)
Pt aware results are not back yet and we would call her as soon as they are and KC reviews them. Pt states the technician advised her it would only take the most was 5 days to get results back. I advised her it can take up to 2 weeks to get these back. She verbalized understanding and nothing further was needed

## 2010-11-24 NOTE — Telephone Encounter (Signed)
Called, spoke with pt.  She is requesting results of alpha 1 test done on 11/15/10.  Looks like the results are still pending in epic.  Aundra Millet, will you pls call [facility] to see if the results are available yet?  Thank you!

## 2010-11-25 ENCOUNTER — Encounter: Payer: Self-pay | Admitting: Pulmonary Disease

## 2010-11-30 ENCOUNTER — Encounter: Payer: Self-pay | Admitting: Pulmonary Disease

## 2010-12-08 ENCOUNTER — Ambulatory Visit (INDEPENDENT_AMBULATORY_CARE_PROVIDER_SITE_OTHER): Payer: BC Managed Care – PPO | Admitting: Pulmonary Disease

## 2010-12-08 ENCOUNTER — Encounter: Payer: Self-pay | Admitting: Pulmonary Disease

## 2010-12-08 DIAGNOSIS — J479 Bronchiectasis, uncomplicated: Secondary | ICD-10-CM

## 2010-12-08 DIAGNOSIS — R05 Cough: Secondary | ICD-10-CM

## 2010-12-08 LAB — PULMONARY FUNCTION TEST

## 2010-12-08 NOTE — Assessment & Plan Note (Signed)
The pt continues to have a cough that sounds more upper airway in nature, and she is still noting ongoing postnasal drip despite nasal ICS and an antihistamine.  Will try her on astelin for a few weeks and see if she sees a difference.  I cannot exclude the possibility of MAC colonization contributing to her symptoms, but she is not coughing up any significant mucus or noting chest congestion.

## 2010-12-08 NOTE — Patient Instructions (Signed)
Stop Qnase nasal spray Start astepro 2 sprays each nostril am and afternoon for first week, then only in am thereafter Continue chlorpheniramine at bedtime, but the 4mg  tab. Please call me in 2 weeks to report back on response to med changes Don't forget to avoid throat clearing and to use hard candy if needed.

## 2010-12-08 NOTE — Progress Notes (Signed)
  Subjective:    Patient ID: Anna Valentine, female    DOB: April 03, 1951, 60 y.o.   MRN: [id number]  HPI The pt comes in today for review of her PFT's.  She was found to have no obstruction, no restriction, and her DLCO was normal as well.  She has known bronchiectasis, but denies any chest congestion or purulent mucus.  She does still have a dry cough that sounds more upper airway than anything else, and she is continuing to have postnasal drip despite a trial of nasal ICS.     Review of Systems  Constitutional: Negative for fever and unexpected weight change.  HENT: Negative for ear pain, nosebleeds, congestion, sore throat, rhinorrhea, sneezing, trouble swallowing, dental problem, postnasal drip and sinus pressure.   Eyes: Negative for redness and itching.  Respiratory: Positive for cough and shortness of breath. Negative for chest tightness and wheezing.   Cardiovascular: Negative for palpitations and leg swelling.  Gastrointestinal: Negative for nausea and vomiting.  Genitourinary: Negative for dysuria.  Musculoskeletal: Negative for joint swelling.  Skin: Negative for rash.  Neurological: Negative for headaches.  Hematological: Does not bruise/bleed easily.  Psychiatric/Behavioral: Negative for dysphoric mood. The patient is not nervous/anxious.        Objective:   Physical Exam Thin female in nad Chest totally clear to auscultation, no wheezing Cor with rrr LE without edema, no cyanosis Alert, oriented, moves all 4        Assessment & Plan:

## 2010-12-08 NOTE — Progress Notes (Signed)
PFT done today. 

## 2010-12-11 ENCOUNTER — Encounter: Payer: Self-pay | Admitting: Pulmonary Disease

## 2010-12-22 ENCOUNTER — Telehealth: Payer: Self-pay | Admitting: Pulmonary Disease

## 2010-12-22 NOTE — Telephone Encounter (Signed)
Spoke with pt. She is calling to give update on cough and drainage. She states that her symptoms are "slightly improved" but states not much difference at all. She states that is still taking chlortrimeton at hs and astepro in the am.  Would like further recs from [facility]. She is aware he is out of the office until 12/27/10. KC, pls advise thanks!

## 2010-12-27 NOTE — Telephone Encounter (Signed)
Pt needs ov to evaluate this.

## 2010-12-28 NOTE — Telephone Encounter (Signed)
Pt set to see KC on 01-10-11 at 3:15 pt aware. Carron Curie, CMA

## 2011-01-03 ENCOUNTER — Telehealth: Payer: Self-pay | Admitting: Internal Medicine

## 2011-01-03 NOTE — Telephone Encounter (Signed)
If she has had a recent tdap vaccine, then she is covered. Please check her chart.

## 2011-01-03 NOTE — Telephone Encounter (Signed)
Patient is covered.  Let patient know.

## 2011-01-06 ENCOUNTER — Encounter: Payer: Self-pay | Admitting: Pulmonary Disease

## 2011-01-10 ENCOUNTER — Ambulatory Visit (INDEPENDENT_AMBULATORY_CARE_PROVIDER_SITE_OTHER): Payer: BC Managed Care – PPO | Admitting: Pulmonary Disease

## 2011-01-10 ENCOUNTER — Encounter: Payer: Self-pay | Admitting: Pulmonary Disease

## 2011-01-10 DIAGNOSIS — R05 Cough: Secondary | ICD-10-CM

## 2011-01-10 DIAGNOSIS — R059 Cough, unspecified: Secondary | ICD-10-CM

## 2011-01-10 DIAGNOSIS — J479 Bronchiectasis, uncomplicated: Secondary | ICD-10-CM

## 2011-01-10 NOTE — Progress Notes (Signed)
  Subjective:    Patient ID: Anna Valentine, female    DOB: 02-14-1951, 60 y.o.   MRN: [id number]  HPI The pt comes in today for f/u of her ongoing cough.  She has known bronchiectasis, but has also been having significant postnasal drip.  She has not improved with nasal ICS or astepro, and continues to have postnasal drip.  Her cough is now productive of purulent appearing mucus.  She denies any purulence from her nares, or significant sinus pressure.  She denies chest congestion or worsening sob.    Review of Systems  Constitutional: Negative for fever and unexpected weight change.  HENT: Positive for postnasal drip. Negative for ear pain, nosebleeds, congestion, sore throat, rhinorrhea, sneezing, trouble swallowing, dental problem and sinus pressure.   Eyes: Negative for redness and itching.  Respiratory: Positive for cough. Negative for chest tightness, shortness of breath and wheezing.   Cardiovascular: Negative for palpitations and leg swelling.  Gastrointestinal: Negative for nausea and vomiting.  Genitourinary: Negative for dysuria.  Musculoskeletal: Negative for joint swelling.  Skin: Negative for rash.  Neurological: Negative for headaches.  Hematological: Does not bruise/bleed easily.  Psychiatric/Behavioral: Negative for dysphoric mood. The patient is not nervous/anxious.        Objective:   Physical Exam Thin female in nad Nares patent without discharge or purulence.  Deviated septum to left Op clear Chest totally clear to auscultation Cor with rrr LE without edema, no cyanosis  Alert and oriented, moves all 4        Assessment & Plan:

## 2011-01-10 NOTE — Patient Instructions (Signed)
Will set up for scan of your sinuses to evaluate for chronic sinusitis.  Will call you with results Please cough up sputum specimen in sterile cup, and bring to lab downstairs asap.

## 2011-01-13 NOTE — Assessment & Plan Note (Signed)
The pt has continued to have cough with purulent mucus, and it is unclear if this is coming from her upper or lower airway.  Pts with bronchiectasis often have chronic sinus disease, so will check scan of her sinuses.  Will also check sputum CS and AFB smear and culture (to evaluate for MAC).

## 2011-01-19 ENCOUNTER — Ambulatory Visit (INDEPENDENT_AMBULATORY_CARE_PROVIDER_SITE_OTHER)
Admission: RE | Admit: 2011-01-19 | Discharge: 2011-01-19 | Disposition: A | Discharge status: Home / Self Care | Payer: BC Managed Care – PPO | Source: Ambulatory Visit | Attending: Pulmonary Disease | Admitting: Pulmonary Disease

## 2011-01-19 DIAGNOSIS — R05 Cough: Secondary | ICD-10-CM

## 2011-01-31 ENCOUNTER — Encounter: Payer: Self-pay | Admitting: Internal Medicine

## 2011-01-31 ENCOUNTER — Ambulatory Visit (INDEPENDENT_AMBULATORY_CARE_PROVIDER_SITE_OTHER): Payer: BC Managed Care – PPO | Admitting: Internal Medicine

## 2011-01-31 VITALS — BP 136/84 | HR 76 | Temp 98.4°F | Ht 66.25 in | Wt 122.0 lb

## 2011-01-31 DIAGNOSIS — L237 Allergic contact dermatitis due to plants, except food: Secondary | ICD-10-CM

## 2011-01-31 DIAGNOSIS — L255 Unspecified contact dermatitis due to plants, except food: Secondary | ICD-10-CM

## 2011-01-31 NOTE — Patient Instructions (Signed)
Take Sterapred DS 10 mg 12  dosepak as directed

## 2011-01-31 NOTE — Progress Notes (Signed)
  Subjective:    Patient ID: Anna Valentine, female    DOB: 1951/02/09, 60 y.o.   MRN: [id number]  HPI patient in today with several day history of itchy vesicular lesions on the arms and legs after doing yard work consistent with contact dermatitis. Says lesions seem to be spreading more of the past couple of days.    Review of Systems     Objective:   Physical Exam linear erythematous vesicular papular lesions on arms and legs consistent with poison ivy        Assessment & Plan:  Poison ivy  Plan: Prescribed Sterapred DS 10 mg 12 day dosepak. Take with food as directed.

## 2011-02-02 ENCOUNTER — Telehealth: Payer: Self-pay | Admitting: Pulmonary Disease

## 2011-02-02 NOTE — Telephone Encounter (Signed)
Called and spoke with pt.  Pt wanted to give [facility] an FYI.  Pt states her cough has "quieted down" and she is unable to get sputum up for culture.  Pt states she wishes to just hold off for now since her cough has improved and she is feeling better.  Pt states she will call us back if her cough returns and she is able to get up mucus.  Will forward message to [facility] as an [place]

## 2011-02-02 NOTE — Telephone Encounter (Signed)
LM with family member for pt TCB 

## 2011-02-02 NOTE — Telephone Encounter (Signed)
Returning call.

## 2011-02-08 NOTE — Telephone Encounter (Signed)
Noted  

## 2011-02-28 ENCOUNTER — Other Ambulatory Visit: Payer: BC Managed Care – PPO | Admitting: Internal Medicine

## 2011-02-28 DIAGNOSIS — Z0000 Encounter for general adult medical examination without abnormal findings: Secondary | ICD-10-CM

## 2011-02-28 LAB — COMPREHENSIVE METABOLIC PANEL
AST: 23 U/L (ref 0–37)
Alkaline Phosphatase: 73 U/L (ref 39–117)
BUN: 21 mg/dL (ref 6–23)
Calcium: 9.3 mg/dL (ref 8.4–10.5)
Chloride: 106 mEq/L (ref 96–112)
Creat: 0.65 mg/dL (ref 0.50–1.10)
Glucose, Bld: 82 mg/dL (ref 70–99)

## 2011-02-28 LAB — CBC WITH DIFFERENTIAL/PLATELET
Basophils Relative: 0 % (ref 0–1)
Eosinophils Absolute: 0.1 10*3/uL (ref 0.0–0.7)
MCH: 30.6 pg (ref 26.0–34.0)
MCHC: 32.5 g/dL (ref 30.0–36.0)
Neutrophils Relative %: 62 % (ref 43–77)
Platelets: 220 10*3/uL (ref 150–400)

## 2011-03-01 ENCOUNTER — Ambulatory Visit (INDEPENDENT_AMBULATORY_CARE_PROVIDER_SITE_OTHER): Payer: BC Managed Care – PPO | Admitting: Internal Medicine

## 2011-03-01 ENCOUNTER — Encounter: Payer: Self-pay | Admitting: Internal Medicine

## 2011-03-01 VITALS — BP 140/82 | HR 76 | Temp 98.3°F | Ht 66.25 in | Wt 122.0 lb

## 2011-03-01 DIAGNOSIS — F411 Generalized anxiety disorder: Secondary | ICD-10-CM

## 2011-03-01 DIAGNOSIS — Z862 Personal history of diseases of the blood and blood-forming organs and certain disorders involving the immune mechanism: Secondary | ICD-10-CM

## 2011-03-01 DIAGNOSIS — Z800 Family history of malignant neoplasm of digestive organs: Secondary | ICD-10-CM

## 2011-03-01 DIAGNOSIS — Z85118 Personal history of other malignant neoplasm of bronchus and lung: Secondary | ICD-10-CM

## 2011-03-01 DIAGNOSIS — Z853 Personal history of malignant neoplasm of breast: Secondary | ICD-10-CM

## 2011-03-01 DIAGNOSIS — F419 Anxiety disorder, unspecified: Secondary | ICD-10-CM

## 2011-03-01 DIAGNOSIS — J479 Bronchiectasis, uncomplicated: Secondary | ICD-10-CM

## 2011-03-01 DIAGNOSIS — Z0000 Encounter for general adult medical examination without abnormal findings: Secondary | ICD-10-CM

## 2011-03-01 LAB — LIPID PANEL
HDL: 80 mg/dL (ref >39)
Total CHOL/HDL Ratio: 2.6 Ratio
Triglycerides: 47 mg/dL (ref <150)

## 2011-03-01 LAB — POCT URINALYSIS DIPSTICK
Blood, UA: NEGATIVE
Ketones, UA: NEGATIVE
Leukocytes, UA: NEGATIVE
Protein, UA: NEGATIVE
Spec Grav, UA: 1.005
pH, UA: 7

## 2011-03-01 LAB — VITAMIN D 25 HYDROXY (VIT D DEFICIENCY, FRACTURES): Vit D, 25-Hydroxy: 42 ng/mL (ref 30–89)

## 2011-03-11 DIAGNOSIS — Z800 Family history of malignant neoplasm of digestive organs: Secondary | ICD-10-CM | POA: Insufficient documentation

## 2011-03-11 DIAGNOSIS — Z862 Personal history of diseases of the blood and blood-forming organs and certain disorders involving the immune mechanism: Secondary | ICD-10-CM | POA: Insufficient documentation

## 2011-03-11 DIAGNOSIS — Z85118 Personal history of other malignant neoplasm of bronchus and lung: Secondary | ICD-10-CM | POA: Insufficient documentation

## 2011-03-11 DIAGNOSIS — F419 Anxiety disorder, unspecified: Secondary | ICD-10-CM | POA: Insufficient documentation

## 2011-03-11 NOTE — Progress Notes (Signed)
Subjective:    Patient ID: Anna Valentine, female    DOB: 05-17-1951, 60 y.o.   MRN: [id number]  HPI and pleasant 60 year old white female with complicated past medical history. In November 2006 she was diagnosed with a 1.2 cm poorly differentiated adenocarcinoma of the lung with negative lymph nodes. In February 2011 she was diagnosed with breast cancer and underwent lumpectomy of the left breast with a diagnosis of lobular carcinoma. Sentinel node biopsy showed one of 4 lymph nodes positive for invasive lobular carcinoma. She was clinical stage II. Tumor was ER positive, PR positive, HER-2/ neu negative. Was placed on Arava dextra five-year starting July 2011. She had radiation therapy completed July 2011. History of osteopenia. 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 History of anal fissure 1996, history of right lower lobe pneumonia December 2007. Last mammogram February 2012.  Bilateral tubal ligation 1984, left bunionectomy 1990, right bunionectomy 1992. Right lower lobectomy at [facility] November 2006. Pneumovax immunization 2008, influenza immunization October 2011, tetanus immunization 2004. Last colonoscopy 2012. GYN is Shirlyn Goltz, nurse practitioner. Has taken Actonel, Fosamax, and Evista in the past but has discontinued all of those. Biaxin causes hives. There is a family history of breast cancer in her mother and sister and family history of colorectal cancer. Father died of colon cancer at age 68. Mother died of metastatic lung cancer at age 77. One brother with died of prostate cancer. One brother in good health. Patient is married. 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 No children.  She smoked 1-1/2-2 packs of cigarettes a day for some 15 years but quit around 1991. No alcohol consumption. Completed 4 years of college.  Interestingly, mother had double mastectomies in her 53s but in December 1992 was found to have  lung cancer presumably a new cancer not related to breast cancer. Mother developed metastatic disease in her brain and passed away. One brother and one sister have hypertension. Mother also had hypertension    Review of Systems  Constitutional: Negative.   HENT: Negative.   Eyes: Negative.   Respiratory: Positive for chest tightness.   Cardiovascular: Negative.   Gastrointestinal: Negative.   Genitourinary: Negative.   Musculoskeletal: Negative.   Neurological: Negative.   Hematological: Negative.   Psychiatric/Behavioral:       Anxiety       Objective:   Physical Exam  Vitals reviewed. Constitutional: She is oriented to person, place, and time. She appears well-nourished.  HENT:  Head: Normocephalic.  Right Ear: External ear normal.  Left Ear: External ear normal.  Mouth/Throat: Oropharynx is clear and moist.  Eyes: Conjunctivae and EOM are normal. Pupils are equal, round, and reactive to light. No scleral icterus.  Neck: No JVD present. No thyromegaly present.  Cardiovascular: Normal rate, regular rhythm, normal heart sounds and intact distal pulses.   No murmur heard. Pulmonary/Chest: Effort normal and breath sounds normal. She has no wheezes. She has no rales.  Abdominal: Soft. Bowel sounds are normal. She exhibits no mass. There is no tenderness. There is no rebound.  Genitourinary:       deferred to GYN  Musculoskeletal: She exhibits no edema.  Lymphadenopathy:    She has no cervical adenopathy.  Neurological: She is alert and oriented to person, place, and time. She has normal reflexes. No cranial nerve deficit.  Skin: Skin is warm and dry. She is not diaphoretic.  Psychiatric: Her behavior is normal. Judgment and thought content  normal.          Assessment & Plan:  History of right lower lobe lobectomy 2006 secondary to 1.2 cm poorly differentiated adenocarcinoma T1N0  History of stage II invasive lobular breast cancer left breast 2011.  History of  osteopenia  Remote history of mature cell line neutropenia 1990s.  Family history of colon cancer with negative colonoscopy 2012  History of bronchiectasis based on CT March 2012  Anxiety  History of HSV infection on buttock  Plan return 1 year or as needed. Continue followup at [facility] for lung cancer. Sees a local oncologist for breast cancer followup.

## 2011-04-13 ENCOUNTER — Ambulatory Visit (INDEPENDENT_AMBULATORY_CARE_PROVIDER_SITE_OTHER): Payer: BC Managed Care – PPO | Admitting: Internal Medicine

## 2011-04-13 DIAGNOSIS — Z23 Encounter for immunization: Secondary | ICD-10-CM

## 2011-04-30 ENCOUNTER — Other Ambulatory Visit: Payer: Self-pay | Admitting: Oncology

## 2011-04-30 DIAGNOSIS — C50919 Malignant neoplasm of unspecified site of unspecified female breast: Secondary | ICD-10-CM

## 2011-05-17 ENCOUNTER — Other Ambulatory Visit (HOSPITAL_BASED_OUTPATIENT_CLINIC_OR_DEPARTMENT_OTHER): Payer: BC Managed Care – PPO | Admitting: Lab

## 2011-05-17 ENCOUNTER — Ambulatory Visit (HOSPITAL_BASED_OUTPATIENT_CLINIC_OR_DEPARTMENT_OTHER): Payer: BC Managed Care – PPO | Admitting: Oncology

## 2011-05-17 ENCOUNTER — Other Ambulatory Visit: Payer: Self-pay | Admitting: Oncology

## 2011-05-17 VITALS — BP 148/90 | HR 64 | Temp 97.7°F | Ht 66.0 in | Wt 124.8 lb

## 2011-05-17 DIAGNOSIS — C50919 Malignant neoplasm of unspecified site of unspecified female breast: Secondary | ICD-10-CM

## 2011-05-17 DIAGNOSIS — C50019 Malignant neoplasm of nipple and areola, unspecified female breast: Secondary | ICD-10-CM

## 2011-05-17 DIAGNOSIS — R5381 Other malaise: Secondary | ICD-10-CM

## 2011-05-17 DIAGNOSIS — Z170 Estrogen receptor positive status [ER+]: Secondary | ICD-10-CM

## 2011-05-17 DIAGNOSIS — M810 Age-related osteoporosis without current pathological fracture: Secondary | ICD-10-CM

## 2011-05-17 LAB — CBC WITH DIFFERENTIAL/PLATELET
Eosinophils Absolute: 0.1 10*3/uL (ref 0.0–0.5)
HGB: 12.4 g/dL (ref 11.6–15.9)
MONO#: 0.3 10*3/uL (ref 0.1–0.9)
NEUT#: 2.1 10*3/uL (ref 1.5–6.5)
RBC: 3.9 10*6/uL (ref 3.70–5.45)
RDW: 12.7 % (ref 11.2–14.5)
WBC: 3.5 10*3/uL — ABNORMAL LOW (ref 3.9–10.3)
lymph#: 1 10*3/uL (ref 0.9–3.3)

## 2011-05-17 LAB — COMPREHENSIVE METABOLIC PANEL
ALT: 15 U/L (ref 0–35)
AST: 24 U/L (ref 0–37)
Chloride: 101 mEq/L (ref 96–112)
Creatinine, Ser: 0.63 mg/dL (ref 0.50–1.10)
Sodium: 138 mEq/L (ref 135–145)
Total Bilirubin: 0.5 mg/dL (ref 0.3–1.2)
Total Protein: 6.8 g/dL (ref 6.0–8.3)

## 2011-05-17 NOTE — Progress Notes (Signed)
Hematology and Oncology Follow Up Visit  Anna Valentine [id number] 10/08/1950 60 y.o. 05/17/2011 11:50 AM  CC: Dr. Jamey Ripa, MJ Baxley  DIAGNOSIS: 60 year old female with #1 stage I lung cancer poorly differentiated grade 2 status post right lower lobe wedge resection with complete lobectomy and mediastinal node biopsy in November 2006 at [facility]. No evidence of recurrent disease.  #2 left invasive lobular carcinoma status post lumpectomy and sentinel node biopsy on 10/12/2009. The final pathology revealed a 0.8 cm invasive lobular carcinoma with one of 4 lymph nodes positive for metastatic disease. Clinical stage II. The tumor was ER positive PR +2 6714% and HER-2/neu negative.  #3 Oncotype DX score was low score and therefore the patient was received radiation therapy followed by adjuvant hormonal therapy consisting of Arimidex 1 mg daily.   PAST THERAPY:  #1 status post lumpectomy for stage II invasive lobular carcinoma in April 2000 leptin.  #2 status post radiation therapy completed on 01/28/2010.  #3 currently on Arimidex 1 mg daily starting on 02/01/2010. Patient is planned for 5 years of therapy  Current therapy: Arimidex 1 mg daily.  Interim History:  The patient is seen in followup today overall she seems to be doing well. She continues to see Dr. Eden Emms Baxley for her ongoing medical care. She was last seen by her and was complaining of some chest tightness. However patient has also seen of pulmonary medicine. Who are initially recommended doing some sputum cytologies for possibly atypical infections. However patient has not had coughing since and has of the time being not followed up with pulmonary. Today she does complain of having some fatigue she does not have cough. She does think that her cough and could possibly be due to allergies. Especially environmental. She does complain of some achiness since being on Arimidex. But this is very much tolerable. She denies  having any nausea vomiting fevers chills night sweats headaches she has noted chest pains no palpitations. She does complain of having some tenderness in the right breast. She is due for a mammogram in February 2013. She has not noticed any masses in this breast. She does complain of having some tenderness in the left breast as well this is the affected breast. She has also noted some increasing palpable masses around the scar of site. She however  has not noticed any dominant masses appear. Or nipple discharge. Remainder of the template review of systems as below.  Medications: I have reviewed the patient's current medications.  Allergies:  Allergies  Allergen Reactions  . Clarithromycin     REACTION: rash    Past Medical History, Surgical history, Social history, and Family History were reviewed and updated.  Review of Systems: Constitutional:  Negative for fever, chills, night sweats, anorexia, weight loss, pain. Cardiovascular: no chest pain or dyspnea on exertion Respiratory: positive for - cough and shortness of breath Neurological: no TIA or stroke symptoms Dermatological: negative ENT: negative Skin Gastrointestinal: no abdominal pain, change in bowel habits, or black or bloody stools Genito-Urinary: no dysuria, trouble voiding, or hematuria Hematological and Lymphatic: negative Breast: negative for breast lumps Musculoskeletal: negative Remaining ROS negative.  Physical Exam:  Blood pressure 148/90, pulse 64, temperature 97.7 F (36.5 C), temperature source Oral, height 5\' 6"  (1.676 m), weight 124 lb 12.8 oz (56.609 kg).  ECOG: 1   General appearance: alert, cooperative and appears stated age Head: Normocephalic, without obvious abnormality, atraumatic Neck: no adenopathy, no carotid bruit, no JVD, supple, symmetrical, trachea midline and  thyroid not enlarged, symmetric, no tenderness/mass/nodules Back: symmetric, no curvature. ROM normal. No CVA tenderness. Resp:  clear to auscultation bilaterally and normal percussion bilaterally Chest wall: no tenderness Breasts: normal appearance, no masses or tenderness, No nipple retraction or dimpling, No nipple discharge or bleeding, No axillary or supraclavicular adenopathy, Normal to palpation without dominant masses Cardio: regular rate and rhythm, S1, S2 normal, no murmur, click, rub or gallop GI: soft, non-tender; bowel sounds normal; no masses,  no organomegaly Extremities: extremities normal, atraumatic, no cyanosis or edema Lymph nodes: Cervical, supraclavicular, and axillary nodes normal. Neurologic: Alert and oriented X 3, normal strength and tone. Normal symmetric reflexes. Normal coordination and gait Sensory: normal Motor: grossly normal   Lab Results: Lab Results  Component Value Date   WBC 3.2* 02/28/2011   HGB 12.4 05/17/2011   HCT 36.4 05/17/2011   MCV 93.4 05/17/2011   PLT 238 05/17/2011     Chemistry      Component Value Date/Time   NA 143 02/28/2011 0910   NA 137 03/23/2010 1019   K 4.3 02/28/2011 0910   K 4.6 03/23/2010 1019   CL 106 02/28/2011 0910   CL 99 03/23/2010 1019   CO2 26 02/28/2011 0910   CO2 29 03/23/2010 1019   BUN 21 02/28/2011 0910   BUN 16 03/23/2010 1019   CREATININE 0.65 02/28/2011 0910   CREATININE 0.64 11/23/2010 1132      Component Value Date/Time   CALCIUM 9.3 02/28/2011 0910   CALCIUM 9.7 03/23/2010 1019   ALKPHOS 73 02/28/2011 0910   ALKPHOS 72 03/23/2010 1019   AST 23 02/28/2011 0910   AST 33 03/23/2010 1019   ALT 14 02/28/2011 0910   BILITOT 0.3 02/28/2011 0910   BILITOT 0.60 03/23/2010 1019       Radiological Studies:  No results found.   IMPRESSIONS AND PLAN: A 60 y.o. female with   #1 stage II invasive lobular carcinoma that was ER/PR positive HER-2/neu negative. Patient is status post radiation therapy. She went on to receive adjuvant hormonal therapy consisting of Arimidex 1 mg daily and overall she is tolerating it well. She is having some achiness.  We did discuss exercise including swimming and walking and yoga. And she continues to be very active and I do think that this is in her favor.  #2 history of lung cancer she is followed at [facility]. She is seeing them on a yearly basis. Overall she has done remarkably well.  #3 shortness of breath patient at this time does not have any shortness of breath although this is intermittent. She is trying to limit her exercise. She will continue to followup with pulmonary on a as needed basis.  #4 osteoporosis patient continues to be on Fosamax and tolerating it well.  #5 patient will be seen back in 6 months time for followup of course she can see [place] sooner if need arises.    Spent more than half the time coordinating care.    Drue Second, MD Medical/Oncology [facility] [phone] (beeper) [phone] (Office)  05/17/2011, 11:50 AM 11/6/201211:50 AM

## 2011-05-23 ENCOUNTER — Other Ambulatory Visit: Payer: Self-pay | Admitting: Oncology

## 2011-06-15 ENCOUNTER — Other Ambulatory Visit: Payer: Self-pay | Admitting: Dermatology

## 2011-08-23 ENCOUNTER — Other Ambulatory Visit: Payer: Self-pay | Admitting: Internal Medicine

## 2011-08-23 DIAGNOSIS — Z853 Personal history of malignant neoplasm of breast: Secondary | ICD-10-CM

## 2011-09-08 ENCOUNTER — Ambulatory Visit
Admission: RE | Admit: 2011-09-08 | Discharge: 2011-09-08 | Disposition: A | Discharge status: Home / Self Care | Payer: BC Managed Care – PPO | Source: Ambulatory Visit | Attending: Internal Medicine | Admitting: Internal Medicine

## 2011-09-08 DIAGNOSIS — Z853 Personal history of malignant neoplasm of breast: Secondary | ICD-10-CM

## 2011-09-29 ENCOUNTER — Encounter (INDEPENDENT_AMBULATORY_CARE_PROVIDER_SITE_OTHER): Payer: Self-pay | Admitting: General Surgery

## 2011-09-30 ENCOUNTER — Encounter (INDEPENDENT_AMBULATORY_CARE_PROVIDER_SITE_OTHER): Payer: Self-pay | Admitting: Surgery

## 2011-09-30 ENCOUNTER — Ambulatory Visit (INDEPENDENT_AMBULATORY_CARE_PROVIDER_SITE_OTHER): Payer: BC Managed Care – PPO | Admitting: Surgery

## 2011-09-30 VITALS — BP 138/86 | HR 76 | Temp 99.2°F | Resp 16 | Ht 66.0 in | Wt 123.0 lb

## 2011-09-30 DIAGNOSIS — Z853 Personal history of malignant neoplasm of breast: Secondary | ICD-10-CM

## 2011-09-30 NOTE — Progress Notes (Signed)
[name]       DOB: 1951/01/01           DATE: 09/30/2011       MRN: [id number]   [name] is a 61 y.o.Anna Kitchenfemale who presents for routine followup of her ILC, Stage II, left breast diagnosed in 2011 and treated with lumpectomy, SLN and radiation. She has no problems or concerns on either side.  PFSH: She has had no significant changes since the last visit here.  ROS: There have been no significant changes since the last visit here  EXAM: General: The patient is alert, oriented, generally healty appearing, NAD. Mood and affect are normal.  Breasts:  Right breast has some post surgical deformity and loss of tissue around the areola from prior biospy. The left shows usual post radiation changes, no dominant mass. Lumpectomy site firm, mild tender.   Lymphatics: She has no axillary or supraclavicular adenopathy on either side.  Extremities: Full ROM of the surgical side with no lymphedema noted.  Data Reviewed: Mammogram showed some new Ca++, likely fat necrosis, 6 month f/u films planned  Impression: Doing well, with no evidence of recurrent cancer or new cancer  Plan: Will continue to follow up on an annual basis here. I think she should have an MRI next year as she has very dense breasts and had a lobular ca.

## 2011-09-30 NOTE — Patient Instructions (Signed)
See me again next year. You should have an MRI next year after your mammogram

## 2011-10-14 ENCOUNTER — Ambulatory Visit (INDEPENDENT_AMBULATORY_CARE_PROVIDER_SITE_OTHER): Payer: BC Managed Care – PPO | Admitting: Internal Medicine

## 2011-10-14 ENCOUNTER — Encounter: Payer: Self-pay | Admitting: Internal Medicine

## 2011-10-14 VITALS — BP 134/88 | Temp 99.3°F | Wt 123.0 lb

## 2011-10-14 DIAGNOSIS — Z853 Personal history of malignant neoplasm of breast: Secondary | ICD-10-CM

## 2011-10-14 DIAGNOSIS — J069 Acute upper respiratory infection, unspecified: Secondary | ICD-10-CM

## 2011-10-14 DIAGNOSIS — Z85118 Personal history of other malignant neoplasm of bronchus and lung: Secondary | ICD-10-CM

## 2011-10-14 NOTE — Patient Instructions (Signed)
Take Levaquin daily for 10 days. Call if not better in 10 days or sooner if worse.

## 2011-10-14 NOTE — Progress Notes (Signed)
  Subjective:    Patient ID: Anna Valentine, female    DOB: [birth year], 61 y.o.   MRN: [id number]  HPI One-week history of URI symptoms. No fever. Cough and congestion. Sounds nasally congested. History of lung cancer. Was recently seen at [facility] and got a good report on CT scan.    Review of Systems     Objective:   Physical Exam HEENT exam: Pharynx is clear; TMs are slightly full but not red; neck is supple without significant adenopathy; chest clear        Assessment & Plan:  URI  History of lung cancer  History of breast cancer  Plan: Levaquin 500 milligrams daily for 10 days. Patient declines offer for cough medication.

## 2011-11-14 ENCOUNTER — Encounter: Payer: Self-pay | Admitting: Oncology

## 2011-11-14 ENCOUNTER — Other Ambulatory Visit: Payer: Self-pay | Admitting: Medical Oncology

## 2011-11-14 ENCOUNTER — Ambulatory Visit (HOSPITAL_BASED_OUTPATIENT_CLINIC_OR_DEPARTMENT_OTHER): Payer: BC Managed Care – PPO | Admitting: Oncology

## 2011-11-14 ENCOUNTER — Other Ambulatory Visit (HOSPITAL_BASED_OUTPATIENT_CLINIC_OR_DEPARTMENT_OTHER): Payer: BC Managed Care – PPO | Admitting: Lab

## 2011-11-14 ENCOUNTER — Telehealth: Payer: Self-pay | Admitting: *Deleted

## 2011-11-14 VITALS — BP 133/81 | HR 88 | Temp 98.8°F | Ht 66.0 in | Wt 124.0 lb

## 2011-11-14 DIAGNOSIS — C50919 Malignant neoplasm of unspecified site of unspecified female breast: Secondary | ICD-10-CM

## 2011-11-14 DIAGNOSIS — Z853 Personal history of malignant neoplasm of breast: Secondary | ICD-10-CM

## 2011-11-14 DIAGNOSIS — Z170 Estrogen receptor positive status [ER+]: Secondary | ICD-10-CM

## 2011-11-14 DIAGNOSIS — Z85118 Personal history of other malignant neoplasm of bronchus and lung: Secondary | ICD-10-CM

## 2011-11-14 DIAGNOSIS — M810 Age-related osteoporosis without current pathological fracture: Secondary | ICD-10-CM

## 2011-11-14 LAB — CBC WITH DIFFERENTIAL/PLATELET
Basophils Absolute: 0 10*3/uL (ref 0.0–0.1)
Eosinophils Absolute: 0.1 10*3/uL (ref 0.0–0.5)
HCT: 36.2 % (ref 34.8–46.6)
HGB: 12.1 g/dL (ref 11.6–15.9)
MCV: 92.8 fL (ref 79.5–101.0)
MONO%: 10.9 % (ref 0.0–14.0)
NEUT#: 3 10*3/uL (ref 1.5–6.5)
NEUT%: 69 % (ref 38.4–76.8)
RDW: 12.6 % (ref 11.2–14.5)
lymph#: 0.8 10*3/uL — ABNORMAL LOW (ref 0.9–3.3)

## 2011-11-14 LAB — COMPREHENSIVE METABOLIC PANEL
Albumin: 4.2 g/dL (ref 3.5–5.2)
CO2: 32 mEq/L (ref 19–32)
Calcium: 9.6 mg/dL (ref 8.4–10.5)
Glucose, Bld: 116 mg/dL — ABNORMAL HIGH (ref 70–99)
Potassium: 4.1 mEq/L (ref 3.5–5.3)
Sodium: 138 mEq/L (ref 135–145)
Total Protein: 7 g/dL (ref 6.0–8.3)

## 2011-11-14 NOTE — Progress Notes (Signed)
Hematology and Oncology Follow Up Visit  Anna Valentine [id number] 1951/01/15 61 y.o. 11/14/2011 12:09 PM  CC: Dr. Jamey Ripa, MJ Baxley  DIAGNOSIS: 61 year old female with #1 stage I lung cancer poorly differentiated grade 2 status post right lower lobe wedge resection with complete lobectomy and mediastinal node biopsy in November 2006 at [facility]. No evidence of recurrent disease.  #2 left invasive lobular carcinoma status post lumpectomy and sentinel node biopsy on 10/12/2009. The final pathology revealed a 0.8 cm invasive lobular carcinoma with one of 4 lymph nodes positive for metastatic disease. Clinical stage II. The tumor was ER positive PR +2 6714% and HER-2/neu negative.  #3 Oncotype DX score was low score and therefore the patient was received radiation therapy followed by adjuvant hormonal therapy consisting of Arimidex 1 mg daily.   PAST THERAPY:  #1 status post lumpectomy for stage II invasive lobular carcinoma in April 2000 leptin.  #2 status post radiation therapy completed on 01/28/2010.  #3 currently on Arimidex 1 mg daily starting on 02/01/2010. Patient is planned for 5 years of therapy  Current therapy: Arimidex 1 mg daily.  Interim History:  The patient is seen in followup today overall she seems to be doing well.Overall she seems to continue to do well. She was recently seen at [facility]. Her breathing is significantly improved and apparently her lung function is getting better. She denies any nausea vomiting fevers chills night sweats headaches shortness of breath chest pains palpitations. She occasionally has aches and pains. She is still running but not as much as she used to. She has no peripheral paresthesias. She continues to work full-time. She has no vaginal discharge no double vision or blurring vision no dizziness. Remainder of the 10 point review of systems is negative.  Medications: I have reviewed the patient's current medications.  Allergies:  Allergies    Allergen Reactions  . Clarithromycin     REACTION: rash    Past Medical History, Surgical history, Social history, and Family History were reviewed and updated.  Review of Systems: Constitutional:  Negative for fever, chills, night sweats, anorexia, weight loss, pain. Cardiovascular: no chest pain or dyspnea on exertion Respiratory: positive for - cough and shortness of breath Neurological: no TIA or stroke symptoms Dermatological: negative ENT: negative Skin Gastrointestinal: no abdominal pain, change in bowel habits, or black or bloody stools Genito-Urinary: no dysuria, trouble voiding, or hematuria Hematological and Lymphatic: negative Breast: negative for breast lumps Musculoskeletal: negative Remaining ROS negative.  Physical Exam:  Blood pressure 133/81, pulse 88, temperature 98.8 F (37.1 C), temperature source Oral, height 5\' 6"  (1.676 m), weight 124 lb (56.246 kg).  ECOG: 1   General appearance: alert, cooperative and appears stated age Head: Normocephalic, without obvious abnormality, atraumatic Neck: no adenopathy, no carotid bruit, no JVD, supple, symmetrical, trachea midline and thyroid not enlarged, symmetric, no tenderness/mass/nodules Back: symmetric, no curvature. ROM normal. No CVA tenderness. Resp: clear to auscultation bilaterally and normal percussion bilaterally Chest wall: no tenderness Breasts: normal appearance, no masses or tenderness, No nipple retraction or dimpling, No nipple discharge or bleeding, No axillary or supraclavicular adenopathy, Normal to palpation without dominant masses Cardio: regular rate and rhythm, S1, S2 normal, no murmur, click, rub or gallop GI: soft, non-tender; bowel sounds normal; no masses,  no organomegaly Extremities: extremities normal, atraumatic, no cyanosis or edema Lymph nodes: Cervical, supraclavicular, and axillary nodes normal. Neurologic: Alert and oriented X 3, normal strength and tone. Normal symmetric  reflexes. Normal coordination and gait Sensory:  normal Motor: grossly normal   Lab Results: Lab Results  Component Value Date   WBC 4.3 11/14/2011   HGB 12.1 11/14/2011   HCT 36.2 11/14/2011   MCV 92.8 11/14/2011   PLT 237 11/14/2011     Chemistry      Component Value Date/Time   NA 138 05/17/2011 1049   NA 138 05/17/2011 1049   NA 137 03/23/2010 1019   K 4.5 05/17/2011 1049   K 4.5 05/17/2011 1049   K 4.6 03/23/2010 1019   CL 101 05/17/2011 1049   CL 101 05/17/2011 1049   CL 99 03/23/2010 1019   CO2 29 05/17/2011 1049   CO2 29 05/17/2011 1049   CO2 29 03/23/2010 1019   BUN 13 05/17/2011 1049   BUN 13 05/17/2011 1049   BUN 16 03/23/2010 1019   CREATININE 0.63 05/17/2011 1049   CREATININE 0.63 05/17/2011 1049   CREATININE 0.65 02/28/2011 0910      Component Value Date/Time   CALCIUM 9.7 05/17/2011 1049   CALCIUM 9.7 05/17/2011 1049   CALCIUM 9.7 03/23/2010 1019   ALKPHOS 58 05/17/2011 1049   ALKPHOS 58 05/17/2011 1049   ALKPHOS 72 03/23/2010 1019   AST 24 05/17/2011 1049   AST 24 05/17/2011 1049   AST 33 03/23/2010 1019   ALT 15 05/17/2011 1049   ALT 15 05/17/2011 1049   BILITOT 0.5 05/17/2011 1049   BILITOT 0.5 05/17/2011 1049   BILITOT 0.60 03/23/2010 1019       Radiological Studies:  No results found.   IMPRESSIONS AND PLAN: A 61 y.o. female with   #1 stage II invasive lobular carcinoma that was ER/PR positive HER-2/neu negative. Patient is status post radiation therapy. She went on to receive adjuvant hormonal therapy consisting of Arimidex 1 mg daily and overall she is tolerating it well.  #2 history of lung cancer she is followed at [facility]. She is seeing them on a yearly basis. Overall she has done remarkably well.   #3 osteoporosis patient continues to be on Fosamax and tolerating it well.  #5 patient will be seen back in 6 months time for followup of course she can see [place] sooner if need arises.  Total visit time 30 min, >50 face to face encounter.   Drue Second,  MD Medical/Oncology [facility] [phone] (beeper) [phone] (Office)  11/14/2011, 12:09 PM 5/6/201312:09 PM

## 2011-11-14 NOTE — Telephone Encounter (Signed)
gave patient appointment for 05-14-2012 starting at 10:30am printed out calendar and gave to the patient

## 2011-11-14 NOTE — Patient Instructions (Signed)
1. Doing well.  2. I will see you back in 6 months. Please call with any problems or questions

## 2011-11-16 ENCOUNTER — Telehealth: Payer: Self-pay | Admitting: Medical Oncology

## 2011-11-16 NOTE — Telephone Encounter (Signed)
Message copied by Tylene Fantasia on Wed Nov 16, 2011 11:05 AM ------      Message from: Victorino December      Created: Wed Nov 16, 2011  8:14 AM       Please call patient: labs good

## 2011-11-16 NOTE — Telephone Encounter (Signed)
Per Dr. Welton Flakes, notified patient that labs looked good.  Patient expressed understanding.

## 2011-12-20 ENCOUNTER — Other Ambulatory Visit: Payer: Self-pay | Admitting: *Deleted

## 2011-12-20 DIAGNOSIS — C50219 Malignant neoplasm of upper-inner quadrant of unspecified female breast: Secondary | ICD-10-CM

## 2011-12-20 MED ORDER — ANASTROZOLE 1 MG PO TABS: 1.0000 mg | ORAL_TABLET | Freq: Every day | ORAL | Status: DC

## 2012-01-30 ENCOUNTER — Other Ambulatory Visit: Payer: Self-pay | Admitting: Internal Medicine

## 2012-01-30 DIAGNOSIS — R921 Mammographic calcification found on diagnostic imaging of breast: Secondary | ICD-10-CM

## 2012-02-23 ENCOUNTER — Ambulatory Visit
Admission: RE | Admit: 2012-02-23 | Discharge: 2012-02-23 | Disposition: A | Discharge status: Home / Self Care | Payer: BC Managed Care – PPO | Source: Ambulatory Visit | Attending: Internal Medicine | Admitting: Internal Medicine

## 2012-02-23 DIAGNOSIS — R921 Mammographic calcification found on diagnostic imaging of breast: Secondary | ICD-10-CM

## 2012-05-02 ENCOUNTER — Ambulatory Visit (INDEPENDENT_AMBULATORY_CARE_PROVIDER_SITE_OTHER): Payer: BC Managed Care – PPO | Admitting: Internal Medicine

## 2012-05-02 DIAGNOSIS — Z23 Encounter for immunization: Secondary | ICD-10-CM

## 2012-05-14 ENCOUNTER — Encounter: Payer: Self-pay | Admitting: Oncology

## 2012-05-14 ENCOUNTER — Ambulatory Visit (HOSPITAL_BASED_OUTPATIENT_CLINIC_OR_DEPARTMENT_OTHER): Payer: BC Managed Care – PPO | Admitting: Oncology

## 2012-05-14 ENCOUNTER — Other Ambulatory Visit (HOSPITAL_BASED_OUTPATIENT_CLINIC_OR_DEPARTMENT_OTHER): Payer: BC Managed Care – PPO | Admitting: Lab

## 2012-05-14 ENCOUNTER — Telehealth: Payer: Self-pay | Admitting: Oncology

## 2012-05-14 VITALS — BP 143/82 | HR 73 | Temp 98.2°F | Resp 20 | Ht 66.0 in | Wt 122.8 lb

## 2012-05-14 DIAGNOSIS — Z85118 Personal history of other malignant neoplasm of bronchus and lung: Secondary | ICD-10-CM

## 2012-05-14 DIAGNOSIS — C50919 Malignant neoplasm of unspecified site of unspecified female breast: Secondary | ICD-10-CM

## 2012-05-14 DIAGNOSIS — Z853 Personal history of malignant neoplasm of breast: Secondary | ICD-10-CM

## 2012-05-14 DIAGNOSIS — M810 Age-related osteoporosis without current pathological fracture: Secondary | ICD-10-CM

## 2012-05-14 DIAGNOSIS — Z170 Estrogen receptor positive status [ER+]: Secondary | ICD-10-CM

## 2012-05-14 LAB — CBC WITH DIFFERENTIAL/PLATELET
Eosinophils Absolute: 0.1 10*3/uL (ref 0.0–0.5)
MONO#: 0.4 10*3/uL (ref 0.1–0.9)
MONO%: 10.4 % (ref 0.0–14.0)
NEUT#: 2.5 10*3/uL (ref 1.5–6.5)
RBC: 4.08 10*6/uL (ref 3.70–5.45)
RDW: 13 % (ref 11.2–14.5)
WBC: 4.2 10*3/uL (ref 3.9–10.3)
lymph#: 1.2 10*3/uL (ref 0.9–3.3)

## 2012-05-14 NOTE — Progress Notes (Signed)
Hematology and Oncology Follow Up Visit  Anna Valentine [id number] 1950/12/19 61 y.o. 05/14/2012 11:45 AM  CC: Dr. Jamey Ripa,  MJ Baxley  DIAGNOSIS: 61 year old female with   #1 stage I lung cancer poorly differentiated grade 2 status post right lower lobe wedge resection with complete lobectomy and mediastinal node biopsy in November 2006 at [facility]. No evidence of recurrent disease.  #2 left invasive lobular carcinoma status post lumpectomy and sentinel node biopsy on 10/12/2009. The final pathology revealed a 0.8 cm invasive lobular carcinoma with one of 4 lymph nodes positive for metastatic disease. Clinical stage II. The tumor was ER positive PR +2 6714% and HER-2/neu negative.  #3 Oncotype DX score was low score and therefore the patient was received radiation therapy followed by adjuvant hormonal therapy consisting of Arimidex 1 mg daily.   PAST THERAPY:  #1 status post lumpectomy for stage II invasive lobular carcinoma in April 2000 leptin.  #2 status post radiation therapy completed on 01/28/2010.  #3 currently on Arimidex 1 mg daily starting on 02/01/2010. Patient is planned for 5 years of therapy  Current therapy: Arimidex 1 mg daily.  Interim History:  The patient is seen in followup today overall she seems to be doing well.Overall she seems to continue to do well. She was recently seen at [facility]. Her breathing is significantly improved and apparently her lung function is getting better. She denies any nausea vomiting fevers chills night sweats headaches shortness of breath chest pains palpitations. She occasionally has aches and pains. She is still running but not as much as she used to. She has no peripheral paresthesias. She continues to work full-time. She has no vaginal discharge no double vision or blurring vision no dizziness. Remainder of the 10 point review of systems is negative.  Medications: I have reviewed the patient's current medications.  Allergies:    Allergies  Allergen Reactions  . Clarithromycin     REACTION: rash    Past Medical History, Surgical history, Social history, and Family History were reviewed and updated.   Physical Exam:  Blood pressure 143/82, pulse 73, temperature 98.2 F (36.8 C), temperature source Oral, resp. rate 20, height 5\' 6"  (1.676 m), weight 122 lb 12.8 oz (55.702 kg).  ECOG: 1  HEENT exam EOMI PERRLA sclerae anicteric no conjunctival pallor oral mucosa is moist neck is supple lungs are clear bilaterally well-healed surgical scar on the right side from her surgery. Cardiovascular is regular rate rhythm abdomen is soft nontender nondistended bowel sounds are present no HSM extremities no edema neuro patient's alert oriented otherwise nonfocal right breast no masses or nipple discharge left breast well-healed excisional scar no masses no nodularity no nipple discharge.  Lab Results: Lab Results  Component Value Date   WBC 4.2 05/14/2012   HGB 12.8 05/14/2012   HCT 38.2 05/14/2012   MCV 93.6 05/14/2012   PLT 233 05/14/2012     Chemistry      Component Value Date/Time   NA 138 11/14/2011 1031   NA 137 03/23/2010 1019   K 4.1 11/14/2011 1031   K 4.6 03/23/2010 1019   CL 101 11/14/2011 1031   CL 99 03/23/2010 1019   CO2 32 11/14/2011 1031   CO2 29 03/23/2010 1019   BUN 13 11/14/2011 1031   BUN 16 03/23/2010 1019   CREATININE 0.70 11/14/2011 1031   CREATININE 0.65 02/28/2011 0910      Component Value Date/Time   CALCIUM 9.6 11/14/2011 1031   CALCIUM 9.7 03/23/2010 1019  ALKPHOS 74 11/14/2011 1031   ALKPHOS 72 03/23/2010 1019   AST 19 11/14/2011 1031   AST 33 03/23/2010 1019   ALT 9 11/14/2011 1031   BILITOT 0.5 11/14/2011 1031   BILITOT 0.60 03/23/2010 1019       Radiological Studies:  No results found.   IMPRESSIONS AND PLAN: A 61 y.o. female with   #1 stage II invasive lobular carcinoma that was ER/PR positive HER-2/neu negative. Patient is status post radiation therapy. She went on to receive adjuvant  hormonal therapy consisting of Arimidex 1 mg daily and overall she is tolerating it well.  #2 history of lung cancer she is followed at [facility]. She is seeing them on a yearly basis. Overall she has done remarkably well.   #3 osteoporosis patient continues to be on Fosamax and tolerating it well.  #5 patient will be seen back in 6 months time for followup of course she can see [place] sooner if need arises.  The length of time of the face-to-face encounter was 30   minutes. More than 50% of time was spent counseling and coordination of care.    Drue Second, MD Medical/Oncology [facility] [phone] (beeper) [phone] (Office)  05/14/2012, 11:45 AM 11/4/201311:45 AM

## 2012-05-14 NOTE — Telephone Encounter (Signed)
gve the pt her jan 2014 appt calendar °

## 2012-05-14 NOTE — Patient Instructions (Addendum)
Hold arimidex for now to see if the elbow pain improves. Call me with the results.  If you are better off the arimidex then we will get on tamoxifen  Tamoxifen oral tablet What is this medicine? TAMOXIFEN (ta MOX i fen) blocks the effects of estrogen. It is commonly used to treat breast cancer. It is also used to decrease the chance of breast cancer coming back in women who have received treatment for the disease. It may also help prevent breast cancer in women who have a high risk of developing breast cancer. This medicine may be used for other purposes; ask your health care provider or pharmacist if you have questions. What should I tell my health care provider before I take this medicine? They need to know if you have any of these conditions: -blood clots -blood disease -cataracts or impaired eyesight -endometriosis -high calcium levels -high cholesterol -irregular menstrual cycles -liver disease -stroke -uterine fibroids -an unusual or allergic reaction to tamoxifen, other medicines, foods, dyes, or preservatives -pregnant or trying to get pregnant -breast-feeding How should I use this medicine? Take this medicine by mouth with a glass of water. Follow the directions on the prescription label. You can take it with or without food. Take your medicine at regular intervals. Do not take your medicine more often than directed. Do not stop taking except on your doctor's advice. A special MedGuide will be given to you by the pharmacist with each prescription and refill. Be sure to read this information carefully each time. Talk to your pediatrician regarding the use of this medicine in children. While this drug may be prescribed for selected conditions, precautions do apply. Overdosage: If you think you have taken too much of this medicine contact a poison control center or emergency room at once. NOTE: This medicine is only for you. Do not share this medicine with others. What if I miss a  dose? If you miss a dose, take it as soon as you can. If it is almost time for your next dose, take only that dose. Do not take double or extra doses. What may interact with this medicine? -aminoglutethimide -bromocriptine -chemotherapy drugs -female hormones, like estrogens and birth control pills -letrozole -medroxyprogesterone -phenobarbital -rifampin -warfarin This list may not describe all possible interactions. Give your health care provider a list of all the medicines, herbs, non-prescription drugs, or dietary supplements you use. Also tell them if you smoke, drink alcohol, or use illegal drugs. Some items may interact with your medicine. What should I watch for while using this medicine? Visit your doctor or health care professional for regular checks on your progress. You will need regular pelvic exams, breast exams, and mammograms. If you are taking this medicine to reduce your risk of getting breast cancer, you should know that this medicine does not prevent all types of breast cancer. If breast cancer or other problems occur, there is no guarantee that it will be found at an early stage. Do not become pregnant while taking this medicine or for 2 months after stopping this medicine. Stop taking this medicine if you get pregnant or think you are pregnant and contact your doctor. This medicine may harm your unborn baby. Women who can possibly become pregnant should use birth control methods that do not use hormones during tamoxifen treatment and for 2 months after therapy has stopped. Talk with your health care provider for birth control advice. Do not breast feed while taking this medicine. What side effects may I notice  from receiving this medicine? Side effects that you should report to your doctor or health care professional as soon as possible: -changes in vision (blurred vision) -changes in your menstrual cycle -difficulty breathing or shortness of breath -difficulty walking or  talking -new breast lumps -numbness -pelvic pain or pressure -redness, blistering, peeling or loosening of the skin, including inside the mouth -skin rash or itching (hives) -sudden chest pain -swelling of lips, face, or tongue -swelling, pain or tenderness in your calf or leg -unusual bruising or bleeding -vaginal discharge that is bloody, brown, or rust -weakness -yellowing of the whites of the eyes or skin Side effects that usually do not require medical attention (report to your doctor or health care professional if they continue or are bothersome): -fatigue -hair loss, although uncommon and is usually mild -headache -hot flashes -impotence (in men) -nausea, vomiting (mild) -vaginal discharge (white or clear) This list may not describe all possible side effects. Call your doctor for medical advice about side effects. You may report side effects to FDA at 1-800-FDA-1088. Where should I keep my medicine? Keep out of the reach of children. Store at room temperature between 20 and 25 degrees C (68 and 77 degrees F). Protect from light. Keep container tightly closed. Throw away any unused medicine after the expiration date. NOTE: This sheet is a summary. It may not cover all possible information. If you have questions about this medicine, talk to your doctor, pharmacist, or health care provider.  2012, [employer]vier/[employer] Standard. (03/13/2008 12:01:56 PM)

## 2012-06-22 ENCOUNTER — Telehealth: Payer: Self-pay | Admitting: Medical Oncology

## 2012-06-22 DIAGNOSIS — C50219 Malignant neoplasm of upper-inner quadrant of unspecified female breast: Secondary | ICD-10-CM

## 2012-06-22 MED ORDER — ANASTROZOLE 1 MG PO TABS: 1.0000 mg | ORAL_TABLET | Freq: Every day | ORAL | Status: DC

## 2012-06-22 NOTE — Telephone Encounter (Signed)
Per MD, prescription ordered for Arimidex 1 mg daily #90/12 refills. Patient notified.

## 2012-06-22 NOTE — Telephone Encounter (Signed)
Restart on arimidex 1 mg daily #90/12 refills

## 2012-06-22 NOTE — Telephone Encounter (Signed)
Patient LVMOM stating she saw Dr. Welton Flakes 05/14/12 and was taken off of Arimidex because of elbow pain. Patient states "still having elbow pain, but not as much. If it's all the same to her I want to go back on Arimidex, and I would need a refill then as well." Will review with MD. F/U appt with lab/MD 07/26/12.

## 2012-06-29 ENCOUNTER — Ambulatory Visit (INDEPENDENT_AMBULATORY_CARE_PROVIDER_SITE_OTHER): Payer: BC Managed Care – PPO | Admitting: Internal Medicine

## 2012-06-29 ENCOUNTER — Encounter: Payer: Self-pay | Admitting: Internal Medicine

## 2012-06-29 VITALS — BP 136/82 | HR 80 | Temp 98.9°F | Wt 123.0 lb

## 2012-06-29 DIAGNOSIS — Z85118 Personal history of other malignant neoplasm of bronchus and lung: Secondary | ICD-10-CM

## 2012-06-29 DIAGNOSIS — H6690 Otitis media, unspecified, unspecified ear: Secondary | ICD-10-CM

## 2012-06-29 DIAGNOSIS — F419 Anxiety disorder, unspecified: Secondary | ICD-10-CM

## 2012-06-29 DIAGNOSIS — K219 Gastro-esophageal reflux disease without esophagitis: Secondary | ICD-10-CM

## 2012-06-29 DIAGNOSIS — F411 Generalized anxiety disorder: Secondary | ICD-10-CM

## 2012-06-29 DIAGNOSIS — H6592 Unspecified nonsuppurative otitis media, left ear: Secondary | ICD-10-CM

## 2012-06-29 DIAGNOSIS — H6590 Unspecified nonsuppurative otitis media, unspecified ear: Secondary | ICD-10-CM

## 2012-06-29 MED ORDER — METHYLPREDNISOLONE ACETATE 80 MG/ML IJ SUSP
80.0000 mg | Freq: Once | INTRAMUSCULAR | Status: AC
  Administered 2012-06-29: 80 mg via INTRAMUSCULAR

## 2012-06-29 NOTE — Progress Notes (Signed)
  Subjective:    Patient ID: Anna Valentine, female    DOB: 10/16/1950, 61 y.o.   MRN: [id number]  HPI For a couple of weeks, patient has had left ear pain when lying down at night. During the day she is fine. Has not really had a respiratory infection that she is aware of. No cough. She also has had some intermittent chest tightness. No wheezing. One episode occurred before eating on the Sunday around lunchtime and lasted several hours. Not associated with diaphoresis. No water brash. She thought it might be reflux. She took TUMS without relief. Had another episode a few days later. History of anxiety. History of lung cancer.    Review of Systems     Objective:   Physical Exam HEENT exam: Pharynx is clear. Left TM is full but not red. Right TM is clear. Neck is supple without adenopathy. Chest clear to auscultation.        Assessment & Plan:  Left serous otitis media  GE reflux-could be aggravated by Fosamax  History of anxiety  History of lung cancer  Plan: Patient should try H2 blocker or over-the-counter Prilosec for reflux instead of TUMS. Levaquin 500 milligrams daily for 7 days for serous otitis media.

## 2012-07-13 ENCOUNTER — Telehealth: Payer: Self-pay | Admitting: Medical Oncology

## 2012-07-13 NOTE — Telephone Encounter (Signed)
Pt LVMOM regarding need to keep MD appt scheduled 07/17/11. Pt states appt was originally made because of possible change of medication from arimidex to tamoxifen and since that did not happen does she need this appt? Per MD, pt can reschedule appt for May as follow up does not need to keep this appt. Pt expressed gratitude. Mssg sent to scheduling to reschedule patient for May office visit.

## 2012-07-14 ENCOUNTER — Telehealth: Payer: Self-pay | Admitting: Oncology

## 2012-07-14 NOTE — Telephone Encounter (Signed)
Advised the pt of her cancelled jan appts that have been moved out to may 2014

## 2012-07-14 NOTE — Patient Instructions (Addendum)
Take Levaquin 500 milligrams daily for 7 days. Take Zantac or Prilosec for reflux symptoms

## 2012-07-23 ENCOUNTER — Telehealth: Payer: Self-pay | Admitting: Internal Medicine

## 2012-07-23 ENCOUNTER — Telehealth (INDEPENDENT_AMBULATORY_CARE_PROVIDER_SITE_OTHER): Payer: Self-pay | Admitting: General Surgery

## 2012-07-23 NOTE — Telephone Encounter (Signed)
I think doing a 3-D mammogram will be appropriate instead of an MRI this time

## 2012-07-23 NOTE — Telephone Encounter (Signed)
Patient is due for her mammogram in February and she has been doing an MR every two years. She is asking if she should continue doing this or do the 3D Mammogram and that would be enough? I told her she should definitely consider the 3D Mammogram and I would ask Dr Jamey Ripa if we could do that in lieu of the MR of breast. Will contact patient back after speaking with Dr Jamey Ripa.

## 2012-07-23 NOTE — Telephone Encounter (Signed)
Message copied by Liliana Cline on Mon Jul 23, 2012 12:42 PM ------      Message from: Zacarias Pontes      Created: Mon Jul 23, 2012 10:56 AM      Contact: [phone]       pls give pt a call concerning MRI that needs to be scheduled please...thanks

## 2012-07-24 DIAGNOSIS — C3431 Malignant neoplasm of lower lobe, right bronchus or lung: Secondary | ICD-10-CM | POA: Insufficient documentation

## 2012-07-24 NOTE — Telephone Encounter (Signed)
Patient expressing concerns over swallowing issues. Appointment made for Friday 07/27/2012

## 2012-07-26 ENCOUNTER — Ambulatory Visit: Payer: BC Managed Care – PPO | Admitting: Oncology

## 2012-07-26 ENCOUNTER — Other Ambulatory Visit: Payer: BC Managed Care – PPO | Admitting: Lab

## 2012-07-27 ENCOUNTER — Ambulatory Visit (INDEPENDENT_AMBULATORY_CARE_PROVIDER_SITE_OTHER): Payer: BC Managed Care – PPO | Admitting: Internal Medicine

## 2012-07-27 ENCOUNTER — Encounter: Payer: Self-pay | Admitting: Internal Medicine

## 2012-07-27 VITALS — BP 130/92 | HR 80 | Temp 98.3°F | Wt 123.0 lb

## 2012-07-27 DIAGNOSIS — Z85118 Personal history of other malignant neoplasm of bronchus and lung: Secondary | ICD-10-CM

## 2012-07-27 DIAGNOSIS — R1310 Dysphagia, unspecified: Secondary | ICD-10-CM

## 2012-07-27 DIAGNOSIS — R0789 Other chest pain: Secondary | ICD-10-CM

## 2012-07-27 DIAGNOSIS — Z853 Personal history of malignant neoplasm of breast: Secondary | ICD-10-CM

## 2012-07-31 NOTE — Progress Notes (Signed)
  Subjective:    Patient ID: Anna Valentine, female    DOB: Jan 07, 1951, 62 y.o.   MRN: [id number]  HPI 62 year old white female with history of lung cancer and breast cancer. She had right lower lobe wedge resection with complete lobectomy and mediastinal node biopsy for stage I poorly differentiated lung cancer at [facility] November 2006 with no evidence of recurrent disease. Just had recent checkup at [facility] and CT scan was normal. History of invasive left lobular carcinoma of the breast status post lumpectomy and sentinel node biopsy April 2011. One of 4 nodes was positive for metastatic disease. Patient was treated with radiation therapy and is currently on Arimidex.  Patient says now for several weeks she has had episodes of dysphagia that is somewhat vague. She discussed it with physician at [facility] and thought it might be globus hystericus. He recommended a barium swallow. This will be done on Friday, January 24 at [facility]. She is a runner and runs 2 or 3 miles fairly easily. She does this without distress. Chest tightness comes on without exercise. No diaphoresis nausea or vomiting associated with chest tightness. She does have a history of anxiety and worries a great deal.  Nonsmoker. Social alcohol consumption. History of osteoporosis on Fosamax.    Review of Systems     Objective:   Physical Exam skin is warm and dry. Nodes none. HEENT exam unremarkable. Neck is supple without JVD thyromegaly or carotid bruits. Chest is clear to auscultation without rales or wheezing. Cardiac exam regular rate and rhythm normal S1 and S2. Abdomen no hepatosplenomegaly masses or tenderness. Extremities without edema.        Assessment & Plan:  Vague history of dysphagia. Food is not getting stuck in her esophagus. Feels like there is some pressure or constriction there. Could possibly be globus hystericus. Need to rule out esophageal ring. Does not appear to have active reflux symptoms.  Vague  history of chest tightness although she is able to run 2-3 miles without symptoms  History of primary lung cancer followed at [facility] with recent CT scan negative  History of primary breast cancer currently on Arimidex followed by Dr.Khan.  Plan: EKG shows no acute changes. She will have barium swallow on Friday, January 24 on Friday, January 24. Cardiology consultation. It is possible that Fosamax is causing esophagitis symptoms. Will have her stop Fosamax and see if symptoms improve.

## 2012-07-31 NOTE — Patient Instructions (Addendum)
Barium swallow has been ordered for Friday, January 24. Cardiology consultation will be obtained. Stop Fosamax and see if symptoms improve

## 2012-08-01 ENCOUNTER — Telehealth: Payer: Self-pay

## 2012-08-01 NOTE — Telephone Encounter (Signed)
Per Dr. Lenord Fellers, left message for patient at home to discontinue her Fosamax to see if symptoms improve.

## 2012-08-03 ENCOUNTER — Ambulatory Visit
Admission: RE | Admit: 2012-08-03 | Discharge: 2012-08-03 | Disposition: A | Discharge status: Home / Self Care | Payer: BC Managed Care – PPO | Source: Ambulatory Visit | Attending: Internal Medicine | Admitting: Internal Medicine

## 2012-08-03 ENCOUNTER — Telehealth: Payer: Self-pay

## 2012-08-03 DIAGNOSIS — R1310 Dysphagia, unspecified: Secondary | ICD-10-CM

## 2012-08-03 NOTE — Telephone Encounter (Signed)
Patient had Barium swallow today which showed moderate Gerd. Calling to tell her this, and she states the radiologist already reviewed it with her while she was there. She already has a rx for Protonix 40 mg  Which Dr. Lenord Fellers gave her in December.

## 2012-08-07 ENCOUNTER — Other Ambulatory Visit: Payer: Self-pay | Admitting: Internal Medicine

## 2012-08-07 DIAGNOSIS — Z853 Personal history of malignant neoplasm of breast: Secondary | ICD-10-CM

## 2012-08-07 NOTE — Progress Notes (Signed)
Patient informed, appointment scheduled for 09/03/2012

## 2012-08-08 ENCOUNTER — Telehealth (INDEPENDENT_AMBULATORY_CARE_PROVIDER_SITE_OTHER): Payer: Self-pay | Admitting: General Surgery

## 2012-08-08 NOTE — Telephone Encounter (Signed)
Spoke with patient and made her aware Dr Jamey Ripa recommends her just having the 3 D Mammogram. Patient will have that done and will keep follow up appt.

## 2012-08-08 NOTE — Telephone Encounter (Signed)
Message copied by Liliana Cline on Wed Aug 08, 2012  3:24 PM ------      Message from: Marin Shutter      Created: Wed Aug 08, 2012  3:00 PM      Regarding: Dr. Tomasita Crumble: [phone]       Pt is seeing Dr. Jamey Ripa on 3/25 and would need orders sent for an MRI.  She already has a mammo scheduled before this date.  Thx

## 2012-09-03 ENCOUNTER — Encounter: Payer: Self-pay | Admitting: Internal Medicine

## 2012-09-03 ENCOUNTER — Other Ambulatory Visit: Payer: Self-pay | Admitting: Internal Medicine

## 2012-09-03 ENCOUNTER — Ambulatory Visit (INDEPENDENT_AMBULATORY_CARE_PROVIDER_SITE_OTHER): Payer: BC Managed Care – PPO | Admitting: Internal Medicine

## 2012-09-03 VITALS — BP 136/84 | Temp 98.4°F | Wt 123.0 lb

## 2012-09-03 DIAGNOSIS — Z853 Personal history of malignant neoplasm of breast: Secondary | ICD-10-CM

## 2012-09-03 DIAGNOSIS — R1310 Dysphagia, unspecified: Secondary | ICD-10-CM

## 2012-09-03 DIAGNOSIS — F411 Generalized anxiety disorder: Secondary | ICD-10-CM

## 2012-09-03 DIAGNOSIS — K219 Gastro-esophageal reflux disease without esophagitis: Secondary | ICD-10-CM

## 2012-09-03 DIAGNOSIS — Z85118 Personal history of other malignant neoplasm of bronchus and lung: Secondary | ICD-10-CM

## 2012-09-03 LAB — CBC WITH DIFFERENTIAL/PLATELET
Eosinophils Relative: 2 % (ref 0–5)
HCT: 35.5 % — ABNORMAL LOW (ref 36.0–46.0)
Lymphocytes Relative: 25 % (ref 12–46)
Lymphs Abs: 1 10*3/uL (ref 0.7–4.0)
MCV: 89 fL (ref 78.0–100.0)
Platelets: 294 10*3/uL (ref 150–400)
RBC: 3.99 MIL/uL (ref 3.87–5.11)
WBC: 4.1 10*3/uL (ref 4.0–10.5)

## 2012-09-03 LAB — COMPREHENSIVE METABOLIC PANEL
ALT: 10 U/L (ref 0–35)
Albumin: 4.1 g/dL (ref 3.5–5.2)
CO2: 29 mEq/L (ref 19–32)
Calcium: 9.9 mg/dL (ref 8.4–10.5)
Chloride: 98 mEq/L (ref 96–112)
Creat: 0.68 mg/dL (ref 0.50–1.10)
Total Protein: 6.8 g/dL (ref 6.0–8.3)

## 2012-09-03 LAB — TSH: TSH: 1.792 u[IU]/mL (ref 0.350–4.500)

## 2012-09-03 NOTE — Progress Notes (Signed)
  Subjective:    Patient ID: Anna Valentine, female    DOB: 1951-03-15, 62 y.o.   MRN: [id number]  HPI Intermittent  swallowing issues  persistent since early December. History of lung cancer treated at [facility] without recurrence in 2006. Sense of something being in her throat. Sensation seems to come and go. Recent Barium swallow did not show stricture. However there was delay in the tablet passing from the esophagus into the stomach. There was also deviation of the esophagus and trachea to the right. This was thought to be due to scarring from cancer surgery based on previous CT scans. Mild to moderate reflux noted on barium swallow. Hx GERD. Had her stop Fosamax .  Stopping Fosamax did not help. Protonix does not seem to be helping. Dentist told her recently that left neck felt full. Had CT scan of chest at [facility]  which looked OK per report scanned into EPIC. She made staff at [facility] aware of the discomfort,  and they thought it might be globus hystericus. One or two episodes of chest pain since December. However is  running 2.5- 3 miles 2- 3 times a week. History of anxiety. Worries a lot about her health. Also is had breast cancer but strong family history of breast cancer. Says she cannot tolerate tight turtleneck around her neck at this point.    Review of Systems     Objective:   Physical Exam possible left thyroid nodule. No adenopathy. Chest clear to auscultation.        Assessment & Plan:  GE reflux-stopping Fosamax did not help. Change from Protonix 40 mg daily to Nexium 40 mg daily  Dysphagia-reflux is certainly a possibility for this feeling but also globus is a possibility. No stricture identified on recent barium swallow.  History of lung cancer  History of breast cancer  Anxiety  Possible left thyroid nodule-order ultrasound of thyroid.  Will have patient seen by ENT for further evaluation. We will have her larynx examined and her neck examined by ENT. Wonder if   tracheal deviation to right seen on CT be causing symptoms? May also need gastroenterology evaluation.

## 2012-09-04 ENCOUNTER — Telehealth: Payer: Self-pay

## 2012-09-04 NOTE — Patient Instructions (Addendum)
ENT appointment will be arranged. Lab work has been drawn including thyroid functions. Change from Protonix to Nexium daily. Ultrasound of thyroid to be done

## 2012-09-04 NOTE — Telephone Encounter (Signed)
Patient scheduled for an [place] of thyroid on 09/06/2012 at 10:30 am, and an appointment with Dr. Lucina Mellow on 09/11/2012 at 9:30 am. She has been informed of both of these

## 2012-09-06 ENCOUNTER — Ambulatory Visit
Admission: RE | Admit: 2012-09-06 | Discharge: 2012-09-06 | Disposition: A | Discharge status: Home / Self Care | Payer: BC Managed Care – PPO | Source: Ambulatory Visit | Attending: Internal Medicine | Admitting: Internal Medicine

## 2012-09-13 ENCOUNTER — Ambulatory Visit
Admission: RE | Admit: 2012-09-13 | Discharge: 2012-09-13 | Disposition: A | Discharge status: Home / Self Care | Payer: BC Managed Care – PPO | Source: Ambulatory Visit | Attending: Internal Medicine | Admitting: Internal Medicine

## 2012-09-13 DIAGNOSIS — Z853 Personal history of malignant neoplasm of breast: Secondary | ICD-10-CM

## 2012-10-02 ENCOUNTER — Ambulatory Visit (INDEPENDENT_AMBULATORY_CARE_PROVIDER_SITE_OTHER): Payer: BC Managed Care – PPO | Admitting: Surgery

## 2012-10-02 ENCOUNTER — Encounter (INDEPENDENT_AMBULATORY_CARE_PROVIDER_SITE_OTHER): Payer: Self-pay | Admitting: Surgery

## 2012-10-02 VITALS — BP 154/86 | HR 77 | Temp 97.0°F | Ht 66.0 in | Wt 121.2 lb

## 2012-10-02 DIAGNOSIS — Z853 Personal history of malignant neoplasm of breast: Secondary | ICD-10-CM

## 2012-10-02 NOTE — Progress Notes (Signed)
[name]       DOB: [birth year]           DATE: 10/02/2012       MRN: [id number]   Anna Valentine is a 62 y.o.Marland Kitchenfemale who presents for routine followup of her ILC, Stage II, left breast diagnosed in 2011 and treated with lumpectomy, SLN and radiation. She has no problems or concerns on either side.  PFSH: She has had no significant changes since the last visit here.  ROS: There have been no significant changes since the last visit here  EXAM: General: The patient is alert, oriented, generally healty appearing, NAD. Mood and affect are normal.  Breasts:  Right breast has some post surgical deformity and loss of tissue around the areola from prior biospy. The left shows usual post radiation changes, some telangiectasias, no dominant mass. Lumpectomy site firm, mild tender.   Lymphatics: She has no axillary or supraclavicular adenopathy on either side.  Extremities: Full ROM of the surgical side with no lymphedema noted.  Data Reviewed: Mammo 03-14 IMPRESSION:  Stable post lumpectomy changes of the left breast.  RECOMMENDATION:  Recommend continued annual bilateral diagnostic mammographic  evaluation.  I have discussed the findings and recommendations with the patient.  Results were also provided in writing at the conclusion of the  visit.  BI-RADS CATEGORY 2: Benign finding(s).  Original Report Authenticated By: Elberta Fortis, M.D.   Impression: Doing well, with no evidence of recurrent cancer or new cancer  Plan: She sees Dr Welton Flakes q 6 months and wants to do all F/U there which should be OK

## 2012-10-02 NOTE — Patient Instructions (Signed)
Continue annual mammograms and follow up with Dr Welton Flakes

## 2012-11-22 ENCOUNTER — Other Ambulatory Visit (HOSPITAL_BASED_OUTPATIENT_CLINIC_OR_DEPARTMENT_OTHER): Payer: BC Managed Care – PPO | Admitting: Lab

## 2012-11-22 ENCOUNTER — Ambulatory Visit (HOSPITAL_BASED_OUTPATIENT_CLINIC_OR_DEPARTMENT_OTHER): Payer: BC Managed Care – PPO | Admitting: Oncology

## 2012-11-22 ENCOUNTER — Telehealth: Payer: Self-pay | Admitting: Oncology

## 2012-11-22 ENCOUNTER — Encounter: Payer: Self-pay | Admitting: Oncology

## 2012-11-22 VITALS — BP 143/80 | HR 76 | Temp 98.0°F | Resp 20 | Ht 66.0 in | Wt 119.6 lb

## 2012-11-22 DIAGNOSIS — C50919 Malignant neoplasm of unspecified site of unspecified female breast: Secondary | ICD-10-CM

## 2012-11-22 DIAGNOSIS — Z853 Personal history of malignant neoplasm of breast: Secondary | ICD-10-CM

## 2012-11-22 DIAGNOSIS — Z170 Estrogen receptor positive status [ER+]: Secondary | ICD-10-CM

## 2012-11-22 DIAGNOSIS — M810 Age-related osteoporosis without current pathological fracture: Secondary | ICD-10-CM

## 2012-11-22 DIAGNOSIS — Z85118 Personal history of other malignant neoplasm of bronchus and lung: Secondary | ICD-10-CM

## 2012-11-22 LAB — COMPREHENSIVE METABOLIC PANEL (CC13)
ALT: 13 U/L (ref 0–55)
Alkaline Phosphatase: 73 U/L (ref 40–150)
Sodium: 141 mEq/L (ref 136–145)
Total Bilirubin: 0.33 mg/dL (ref 0.20–1.20)
Total Protein: 7.4 g/dL (ref 6.4–8.3)

## 2012-11-22 LAB — CBC WITH DIFFERENTIAL/PLATELET
BASO%: 0.4 % (ref 0.0–2.0)
EOS%: 2.6 % (ref 0.0–7.0)
LYMPH%: 23.2 % (ref 14.0–49.7)
MCH: 30.4 pg (ref 25.1–34.0)
MCHC: 33.6 g/dL (ref 31.5–36.0)
MCV: 90.4 fL (ref 79.5–101.0)
MONO%: 8 % (ref 0.0–14.0)
Platelets: 262 10*3/uL (ref 145–400)
RBC: 4.27 10*6/uL (ref 3.70–5.45)
WBC: 4.1 10*3/uL (ref 3.9–10.3)

## 2012-11-22 NOTE — Progress Notes (Signed)
Hematology and Oncology Follow Up Visit  Anna Valentine [id number] [birth year] 62 y.o. 11/22/2012 11:30 AM  CC: Dr. Jamey Ripa,  MJ Baxley  DIAGNOSIS: 62 year old female with   #1 stage I lung cancer poorly differentiated grade 2 status post right lower lobe wedge resection with complete lobectomy and mediastinal node biopsy in November 2006 at [facility]. No evidence of recurrent disease.  #2 left invasive lobular carcinoma status post lumpectomy and sentinel node biopsy on 10/12/2009. The final pathology revealed a 0.8 cm invasive lobular carcinoma with one of 4 lymph nodes positive for metastatic disease. Clinical stage II. The tumor was ER positive PR +2 6714% and HER-2/neu negative.  #3 Oncotype DX score was low score and therefore the patient was received radiation therapy followed by adjuvant hormonal therapy consisting of Arimidex 1 mg daily.   PAST THERAPY:  #1 status post lumpectomy for stage II invasive lobular carcinoma in April 2000 leptin.  #2 status post radiation therapy completed on 01/28/2010.  #3 currently on Arimidex 1 mg daily starting on 02/01/2010. Patient is planned for 5 years of therapy  Current therapy: Arimidex 1 mg daily.  Interim History:  The patient is seen in followup today overall she seems to be doing well. Her breathing is significantly improved and apparently her lung function is getting better. She denies any nausea vomiting fevers chills night sweats headaches shortness of breath chest pains palpitations. She occasionally has aches and pains. She is still running but not as much as she used to. She has no peripheral paresthesias. She continues to work full-time. She has no vaginal discharge no double vision or blurring vision no dizziness. Remainder of the 10 point review of systems is negative.  Medications: I have reviewed the patient's current medications.  Allergies:  Allergies  Allergen Reactions  . Clarithromycin     REACTION: rash     Past Medical History, Surgical history, Social history, and Family History were reviewed and updated.   Physical Exam:  Blood pressure 143/80, pulse 76, temperature 98 F (36.7 C), temperature source Oral, resp. rate 20, height 5\' 6"  (1.676 m), weight 119 lb 9.6 oz (54.25 kg).  ECOG: 1  HEENT exam EOMI PERRLA sclerae anicteric no conjunctival pallor oral mucosa is moist neck is supple lungs are clear bilaterally well-healed surgical scar on the right side from her surgery. Cardiovascular is regular rate rhythm abdomen is soft nontender nondistended bowel sounds are present no HSM extremities no edema neuro patient's alert oriented otherwise nonfocal right breast no masses or nipple discharge left breast well-healed excisional scar no masses no nodularity no nipple discharge.  Lab Results: Lab Results  Component Value Date   WBC 4.1 11/22/2012   HGB 13.0 11/22/2012   HCT 38.6 11/22/2012   MCV 90.4 11/22/2012   PLT 262 11/22/2012     Chemistry      Component Value Date/Time   NA 141 11/22/2012 1025   NA 134* 09/03/2012 1552   NA 137 03/23/2010 1019   K 4.5 11/22/2012 1025   K 4.6 09/03/2012 1552   K 4.6 03/23/2010 1019   CL 103 11/22/2012 1025   CL 98 09/03/2012 1552   CL 99 03/23/2010 1019   CO2 28 11/22/2012 1025   CO2 29 09/03/2012 1552   CO2 29 03/23/2010 1019   BUN 13.6 11/22/2012 1025   BUN 18 09/03/2012 1552   BUN 16 03/23/2010 1019   CREATININE 0.8 11/22/2012 1025   CREATININE 0.68 09/03/2012 1552   CREATININE 0.70  11/14/2011 1031      Component Value Date/Time   CALCIUM 10.0 11/22/2012 1025   CALCIUM 9.9 09/03/2012 1552   CALCIUM 9.7 03/23/2010 1019   ALKPHOS 73 11/22/2012 1025   ALKPHOS 65 09/03/2012 1552   ALKPHOS 72 03/23/2010 1019   AST 22 11/22/2012 1025   AST 18 09/03/2012 1552   AST 33 03/23/2010 1019   ALT 13 11/22/2012 1025   ALT 10 09/03/2012 1552   BILITOT 0.33 11/22/2012 1025   BILITOT 0.5 09/03/2012 1552   BILITOT 0.60 03/23/2010 1019       Radiological  Studies:  No results found.   IMPRESSIONS AND PLAN: A 62 y.o. female with   #1 stage II invasive lobular carcinoma that was ER/PR positive HER-2/neu negative. Patient is status post radiation therapy. She went on to receive adjuvant hormonal therapy consisting of Arimidex 1 mg daily and overall she is tolerating it well.  #2 history of lung cancer she is followed at [facility]. She is seeing them on a yearly basis. Overall she has done remarkably well.   #3 osteoporosis patient continues to be on Fosamax and tolerating it well.  #5 patient will be seen back in 6 months time for followup of course she can see [place] sooner if need arises.  The length of time of the face-to-face encounter was 30   minutes. More than 50% of time was spent counseling and coordination of care.    Drue Second, MD Medical/Oncology [facility] [phone] (beeper) [phone] (Office)  11/22/2012, 11:30 AM 5/15/201411:30 AM

## 2012-11-22 NOTE — Patient Instructions (Addendum)
You are doing terrific without any evidence of recurrent breast cancer  Continue Arimidex 1 mg daily  I will see you back in 6 months time.

## 2013-03-07 ENCOUNTER — Telehealth: Payer: Self-pay | Admitting: *Deleted

## 2013-03-07 NOTE — Telephone Encounter (Signed)
sw pt informed the pt that KK will be in [facility] on 05/22/13. gv appt for 05/20/13 w/labs@ 11:45am and ov@ 12:15p. Pt is aware...td

## 2013-04-10 ENCOUNTER — Ambulatory Visit (INDEPENDENT_AMBULATORY_CARE_PROVIDER_SITE_OTHER): Payer: BC Managed Care – PPO | Admitting: Internal Medicine

## 2013-04-10 DIAGNOSIS — Z23 Encounter for immunization: Secondary | ICD-10-CM

## 2013-04-11 ENCOUNTER — Ambulatory Visit: Payer: BC Managed Care – PPO | Admitting: Internal Medicine

## 2013-05-01 ENCOUNTER — Ambulatory Visit (INDEPENDENT_AMBULATORY_CARE_PROVIDER_SITE_OTHER): Payer: BC Managed Care – PPO | Admitting: Internal Medicine

## 2013-05-01 ENCOUNTER — Encounter: Payer: Self-pay | Admitting: Internal Medicine

## 2013-05-01 VITALS — BP 144/84 | HR 76 | Temp 97.9°F | Ht 66.0 in | Wt 122.0 lb

## 2013-05-01 DIAGNOSIS — Z112 Encounter for screening for other bacterial diseases: Secondary | ICD-10-CM

## 2013-05-01 DIAGNOSIS — J020 Streptococcal pharyngitis: Secondary | ICD-10-CM

## 2013-05-01 MED ORDER — LEVOFLOXACIN 500 MG PO TABS: 500.0000 mg | ORAL_TABLET | Freq: Every day | ORAL | Status: DC

## 2013-05-01 NOTE — Patient Instructions (Addendum)
Take Levaquin 500 mg daily for 7 days with one refill. If not better in 7 days, have prescription refilled.

## 2013-05-01 NOTE — Progress Notes (Signed)
  Subjective:    Patient ID: Anna Valentine, female    DOB: 29-Jan-1951, 62 y.o.   MRN: [id number]  HPI 5 day history of URI symptoms. Slight sore throat. A lot of postnasal drip. No fever or shaking chills.    Review of Systems     Objective:   Physical Exam Neck is supple without adenopathy. Pharynx is clear. Rapid strep screen is negative. TMs are clear. Skin is warm and dry. Chest clear without rales or wheezing.       Assessment & Plan:  Acute URI  History of lung cancer  Plan: Levaquin 500 milligrams daily for 7 days with one refill and if not better in 7 days have prescription refilled.

## 2013-05-14 ENCOUNTER — Ambulatory Visit (INDEPENDENT_AMBULATORY_CARE_PROVIDER_SITE_OTHER): Payer: BC Managed Care – PPO | Admitting: Internal Medicine

## 2013-05-14 ENCOUNTER — Encounter: Payer: Self-pay | Admitting: Internal Medicine

## 2013-05-14 VITALS — BP 142/82 | HR 76 | Temp 98.1°F | Ht 66.0 in | Wt 122.0 lb

## 2013-05-14 DIAGNOSIS — Z23 Encounter for immunization: Secondary | ICD-10-CM

## 2013-05-19 ENCOUNTER — Encounter: Payer: Self-pay | Admitting: Internal Medicine

## 2013-05-20 ENCOUNTER — Encounter: Payer: Self-pay | Admitting: Oncology

## 2013-05-20 ENCOUNTER — Ambulatory Visit (HOSPITAL_BASED_OUTPATIENT_CLINIC_OR_DEPARTMENT_OTHER): Payer: BC Managed Care – PPO | Admitting: Oncology

## 2013-05-20 ENCOUNTER — Other Ambulatory Visit (HOSPITAL_BASED_OUTPATIENT_CLINIC_OR_DEPARTMENT_OTHER): Payer: BC Managed Care – PPO

## 2013-05-20 ENCOUNTER — Telehealth: Payer: Self-pay | Admitting: *Deleted

## 2013-05-20 VITALS — BP 125/75 | HR 76 | Temp 98.3°F | Resp 18 | Ht 66.0 in | Wt 121.4 lb

## 2013-05-20 DIAGNOSIS — Z853 Personal history of malignant neoplasm of breast: Secondary | ICD-10-CM

## 2013-05-20 DIAGNOSIS — C50919 Malignant neoplasm of unspecified site of unspecified female breast: Secondary | ICD-10-CM

## 2013-05-20 DIAGNOSIS — Z85118 Personal history of other malignant neoplasm of bronchus and lung: Secondary | ICD-10-CM

## 2013-05-20 DIAGNOSIS — Z170 Estrogen receptor positive status [ER+]: Secondary | ICD-10-CM

## 2013-05-20 LAB — CBC WITH DIFFERENTIAL/PLATELET
BASO%: 0.5 % (ref 0.0–2.0)
Basophils Absolute: 0 10*3/uL (ref 0.0–0.1)
HCT: 34.3 % — ABNORMAL LOW (ref 34.8–46.6)
HGB: 11.3 g/dL — ABNORMAL LOW (ref 11.6–15.9)
LYMPH%: 16.8 % (ref 14.0–49.7)
MCH: 30.3 pg (ref 25.1–34.0)
MCHC: 33 g/dL (ref 31.5–36.0)
MONO#: 0.5 10*3/uL (ref 0.1–0.9)
NEUT%: 70.7 % (ref 38.4–76.8)
Platelets: 253 10*3/uL (ref 145–400)
WBC: 5 10*3/uL (ref 3.9–10.3)

## 2013-05-20 LAB — COMPREHENSIVE METABOLIC PANEL (CC13)
ALT: 16 U/L (ref 0–55)
Anion Gap: 10 mEq/L (ref 3–11)
BUN: 9.2 mg/dL (ref 7.0–26.0)
CO2: 25 mEq/L (ref 22–29)
Calcium: 9.8 mg/dL (ref 8.4–10.4)
Creatinine: 0.6 mg/dL (ref 0.6–1.1)
Total Bilirubin: 0.74 mg/dL (ref 0.20–1.20)

## 2013-05-20 NOTE — Progress Notes (Signed)
Hematology and Oncology Follow Up Visit  Anna Valentine [id number] [birth year] 62 y.o. 05/20/2013 12:54 PM  CC: Dr. Jamey Valentine,  Anna Valentine  DIAGNOSIS: 62 year old female with   #1 stage I lung cancer poorly differentiated grade 2 status post right lower lobe wedge resection with complete lobectomy and mediastinal node biopsy in November 2006 at [facility]. No evidence of recurrent disease.  #2 left invasive lobular carcinoma status post lumpectomy and sentinel node biopsy on 10/12/2009. The final pathology revealed a 0.8 cm invasive lobular carcinoma with one of 4 lymph nodes positive for metastatic disease. Clinical stage II. The tumor was ER positive PR +2 6714% and HER-2/neu negative.  #3 Oncotype DX score was low score and therefore the patient was received radiation therapy followed by adjuvant hormonal therapy consisting of Arimidex 1 mg daily.   PAST THERAPY:  #1 status post lumpectomy for stage II invasive lobular carcinoma in April 2000 leptin.  #2 status post radiation therapy completed on 01/28/2010.  #3 currently on Arimidex 1 mg daily starting on 02/01/2010. Patient is planned for 5 years of therapy  Current therapy: Arimidex 1 mg daily.  Interim History:  The patient is seen in followup today overall she seems to be doing well. She denies any nausea vomiting fevers chills night sweats headaches shortness of breath chest pains palpitations. She occasionally has aches and pains. She is still running but not as much as she used to. She has no peripheral paresthesias. She continues to work full-time. She has no vaginal discharge no double vision or blurring vision no dizziness. Remainder of the 10 point review of systems is negative.  Medications: I have reviewed the patient's current medications.  Allergies:  Allergies  Allergen Reactions  . Clarithromycin     REACTION: rash    Past Medical History, Surgical history, Social history, and Family History were reviewed  and updated.   Physical Exam:  Blood pressure 125/75, pulse 76, temperature 98.3 F (36.8 C), temperature source Oral, resp. rate 18, height 5\' 6"  (1.676 m), weight 121 lb 6.4 oz (55.067 kg).  ECOG: 1  HEENT exam EOMI PERRLA sclerae anicteric no conjunctival pallor oral mucosa is moist neck is supple lungs are clear bilaterally well-healed surgical scar on the right side from her surgery. Cardiovascular is regular rate rhythm abdomen is soft nontender nondistended bowel sounds are present no HSM extremities no edema neuro patient's alert oriented otherwise nonfocal right breast no masses or nipple discharge left breast well-healed excisional scar no masses no nodularity no nipple discharge.  Lab Results: Lab Results  Component Value Date   WBC 5.0 05/20/2013   HGB 11.3* 05/20/2013   HCT 34.3* 05/20/2013   MCV 91.7 05/20/2013   PLT 253 05/20/2013     Chemistry      Component Value Date/Time   NA 141 11/22/2012 1025   NA 134* 09/03/2012 1552   NA 137 03/23/2010 1019   K 4.5 11/22/2012 1025   K 4.6 09/03/2012 1552   K 4.6 03/23/2010 1019   CL 103 11/22/2012 1025   CL 98 09/03/2012 1552   CL 99 03/23/2010 1019   CO2 28 11/22/2012 1025   CO2 29 09/03/2012 1552   CO2 29 03/23/2010 1019   BUN 13.6 11/22/2012 1025   BUN 18 09/03/2012 1552   BUN 16 03/23/2010 1019   CREATININE 0.8 11/22/2012 1025   CREATININE 0.68 09/03/2012 1552   CREATININE 0.70 11/14/2011 1031      Component Value Date/Time   CALCIUM  10.0 11/22/2012 1025   CALCIUM 9.9 09/03/2012 1552   CALCIUM 9.7 03/23/2010 1019   ALKPHOS 73 11/22/2012 1025   ALKPHOS 65 09/03/2012 1552   ALKPHOS 72 03/23/2010 1019   AST 22 11/22/2012 1025   AST 18 09/03/2012 1552   AST 33 03/23/2010 1019   ALT 13 11/22/2012 1025   ALT 10 09/03/2012 1552   ALT 26 03/23/2010 1019   BILITOT 0.33 11/22/2012 1025   BILITOT 0.5 09/03/2012 1552   BILITOT 0.60 03/23/2010 1019       Radiological Studies:  No results found.   IMPRESSIONS AND PLAN: A 62 y.o. female  with   #1 stage II invasive lobular carcinoma that was ER/PR positive HER-2/neu negative. Patient is status post radiation therapy. She went on to receive adjuvant hormonal therapy consisting of Arimidex 1 mg daily and overall she is tolerating it well.  #2 history of lung cancer she is followed at [facility]. She is seeing them on a yearly basis. Overall she has done remarkably well.  #3 patient will be seen back in 6 months time for followup of course she can see [place] sooner if need arises.  The length of time of the face-to-face encounter was 30   minutes. More than 50% of time was spent counseling and coordination of care.    Anna Second, MD Medical/Oncology [facility] [phone] (beeper) [phone] (Office)  05/20/2013, 12:54 PM 11/10/201412:54 PM

## 2013-05-20 NOTE — Telephone Encounter (Signed)
appts made and printed...td 

## 2013-05-22 ENCOUNTER — Other Ambulatory Visit: Payer: BC Managed Care – PPO | Admitting: Lab

## 2013-05-22 ENCOUNTER — Ambulatory Visit: Payer: BC Managed Care – PPO | Admitting: Oncology

## 2013-05-26 NOTE — Progress Notes (Signed)
  Subjective:    Patient ID: [name], female    DOB: January 11, 1951, 62 y.o.   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 Patient has history of breast and lung cancer but is currently free of disease. No communicable diseases. Vision  screen : Left eye 20/40; Right eye 20/30. Hearing screen is normal.  History of GE reflux. Had swallowing issues with sense of something being in her throat early 2014. Had her stop Fosamax and take Nexium 40 mg daily in February 2014. Symptoms improved. Barium swallow did not show stricture.    Review of Systems     Objective:   Physical Exam Neck is supple without JVD, thyromegaly or carotid bruits. Chest clear to auscultation. Cardiac exam: regular rate and rhythm normal S1 and S2. Abdomen no hepatosplenomegaly masses or tenderness. Extremities without edema. Neuro no focal deficits.        Assessment & Plan:  Normal employment physical exam  Plan: Return as needed. Is followed at [facility] for lung cancer and at the [facility] for breast cancer

## 2013-05-26 NOTE — Patient Instructions (Signed)
Employment form signed. Return as needed.

## 2013-07-03 ENCOUNTER — Ambulatory Visit (INDEPENDENT_AMBULATORY_CARE_PROVIDER_SITE_OTHER): Payer: BC Managed Care – PPO | Admitting: Internal Medicine

## 2013-07-03 VITALS — BP 164/84 | HR 80 | Temp 98.4°F | Wt 121.0 lb

## 2013-07-03 DIAGNOSIS — J069 Acute upper respiratory infection, unspecified: Secondary | ICD-10-CM

## 2013-07-03 MED ORDER — HYDROCODONE-HOMATROPINE 5-1.5 MG/5ML PO SYRP: 5.0000 mL | ORAL_SOLUTION | Freq: Three times a day (TID) | ORAL | Status: DC | PRN

## 2013-07-03 MED ORDER — PROMETHAZINE HCL 25 MG PO TABS: 25.0000 mg | ORAL_TABLET | Freq: Three times a day (TID) | ORAL | Status: DC | PRN

## 2013-07-03 NOTE — Progress Notes (Signed)
   Subjective:    Patient ID: Anna Valentine, female    DOB: Apr 15, 1951, 62 y.o.   MRN: [id number]  HPI Patient has come down with an upper respiratory infection. Has started taking some leftover Levaquin from a previous prescription. She is concerned about possible pneumonia. Thinks she caught this at church. Remote history of lung cancer. Remote history of breast cancer. No fever or shaking chills. Has cough.    Review of Systems     Objective:   Physical Exam HEENT exam: Slightly injected pharynx without exudate. TMs are clear. Neck is supple without significant adenopathy. Chest clear.dictation without rales or wheezing.       Assessment & Plan:  Acute URI  Plan: Levaquin 500 milligrams daily for 7 days. Call if not better in 7-10 days. Hycodan 8 ounces 1 teaspoon by mouth Q8 hours when necessary cough.

## 2013-07-05 ENCOUNTER — Encounter: Payer: Self-pay | Admitting: Internal Medicine

## 2013-07-05 NOTE — Patient Instructions (Addendum)
Take Levaquin and Hycodan as directed. Call if not better in 7-10 days.

## 2013-07-16 ENCOUNTER — Ambulatory Visit
Admission: RE | Admit: 2013-07-16 | Discharge: 2013-07-16 | Disposition: A | Discharge status: Home / Self Care | Payer: BC Managed Care – PPO | Source: Ambulatory Visit | Attending: Internal Medicine | Admitting: Internal Medicine

## 2013-07-16 ENCOUNTER — Other Ambulatory Visit: Payer: Self-pay

## 2013-07-16 ENCOUNTER — Telehealth: Payer: Self-pay | Admitting: Internal Medicine

## 2013-07-16 DIAGNOSIS — IMO0001 Reserved for inherently not codable concepts without codable children: Secondary | ICD-10-CM

## 2013-07-16 DIAGNOSIS — R05 Cough: Secondary | ICD-10-CM

## 2013-07-16 DIAGNOSIS — R059 Cough, unspecified: Secondary | ICD-10-CM

## 2013-07-16 NOTE — Telephone Encounter (Signed)
Likely viral. She should get CXR and come in tomorrow.

## 2013-07-16 NOTE — Telephone Encounter (Signed)
Spoke with patient; advised to go have chest x-ray either this evening or in the morning at [facility] (across the street from our office) before her appointment.  Patient will see Dr. Renold Genta on 07/17/13 @ 1045.  Patient verbalized understanding of instructions.

## 2013-07-17 ENCOUNTER — Ambulatory Visit (INDEPENDENT_AMBULATORY_CARE_PROVIDER_SITE_OTHER): Payer: BC Managed Care – PPO | Admitting: Internal Medicine

## 2013-07-17 ENCOUNTER — Encounter: Payer: Self-pay | Admitting: Internal Medicine

## 2013-07-17 VITALS — BP 128/84 | HR 76 | Temp 98.9°F | Wt 121.0 lb

## 2013-07-17 DIAGNOSIS — J069 Acute upper respiratory infection, unspecified: Secondary | ICD-10-CM

## 2013-08-12 ENCOUNTER — Other Ambulatory Visit: Payer: Self-pay | Admitting: Oncology

## 2013-08-12 DIAGNOSIS — Z853 Personal history of malignant neoplasm of breast: Secondary | ICD-10-CM

## 2013-08-26 ENCOUNTER — Other Ambulatory Visit: Payer: Self-pay | Admitting: Oncology

## 2013-08-30 ENCOUNTER — Telehealth: Payer: Self-pay | Admitting: Oncology

## 2013-08-30 NOTE — Telephone Encounter (Signed)
, °

## 2013-09-08 NOTE — Progress Notes (Signed)
   Subjective:    Patient ID: Anna Valentine, female    DOB: May 26, 1951, 63 y.o.   MRN: [id number]  HPI Patient is concerned because she's had recent URI. Had cough with some slight blood-tinged sputum. Nothing very serious. She was a bit alarmed with her history of lung cancer. Seems to be getting better.    Review of Systems     Objective:   Physical Exam Neck is supple without JVD thyromegaly or carotid bruits. No adenopathy. Chest clear to auscultation without rales or wheezing.       Assessment & Plan:  Acute URI which is improving  Plan: Patient reassured blood-tinged sputum at this point is probably not an issue. Call if symptoms persist.

## 2013-09-08 NOTE — Patient Instructions (Addendum)
Call if URI symptoms persist.

## 2013-09-11 ENCOUNTER — Encounter: Payer: Self-pay | Admitting: *Deleted

## 2013-09-12 ENCOUNTER — Encounter: Payer: Self-pay | Admitting: Nurse Practitioner

## 2013-09-12 ENCOUNTER — Ambulatory Visit (INDEPENDENT_AMBULATORY_CARE_PROVIDER_SITE_OTHER): Payer: BC Managed Care – PPO | Admitting: Nurse Practitioner

## 2013-09-12 VITALS — BP 162/96 | HR 64 | Resp 16 | Ht 66.0 in | Wt 119.0 lb

## 2013-09-12 DIAGNOSIS — C50219 Malignant neoplasm of upper-inner quadrant of unspecified female breast: Secondary | ICD-10-CM

## 2013-09-12 DIAGNOSIS — M8580 Other specified disorders of bone density and structure, unspecified site: Secondary | ICD-10-CM

## 2013-09-12 DIAGNOSIS — Z01419 Encounter for gynecological examination (general) (routine) without abnormal findings: Secondary | ICD-10-CM

## 2013-09-12 DIAGNOSIS — M949 Disorder of cartilage, unspecified: Secondary | ICD-10-CM

## 2013-09-12 DIAGNOSIS — B009 Herpesviral infection, unspecified: Secondary | ICD-10-CM

## 2013-09-12 DIAGNOSIS — E559 Vitamin D deficiency, unspecified: Secondary | ICD-10-CM

## 2013-09-12 DIAGNOSIS — N952 Postmenopausal atrophic vaginitis: Secondary | ICD-10-CM

## 2013-09-12 DIAGNOSIS — M899 Disorder of bone, unspecified: Secondary | ICD-10-CM

## 2013-09-12 DIAGNOSIS — C50212 Malignant neoplasm of upper-inner quadrant of left female breast: Secondary | ICD-10-CM

## 2013-09-12 DIAGNOSIS — Z0000 Encounter for general adult medical examination without abnormal findings: Secondary | ICD-10-CM

## 2013-09-12 DIAGNOSIS — C3430 Malignant neoplasm of lower lobe, unspecified bronchus or lung: Secondary | ICD-10-CM

## 2013-09-12 LAB — POCT URINALYSIS DIPSTICK
Bilirubin, UA: NEGATIVE
Glucose, UA: NEGATIVE
Ketones, UA: NEGATIVE
Leukocytes, UA: NEGATIVE
NITRITE UA: NEGATIVE
PH UA: 8
Protein, UA: NEGATIVE
RBC UA: NEGATIVE
Urobilinogen, UA: NEGATIVE

## 2013-09-12 LAB — HEMOGLOBIN, FINGERSTICK: Hemoglobin, fingerstick: 12.6 g/dL (ref 12.0–16.0)

## 2013-09-12 MED ORDER — ACYCLOVIR 400 MG PO TABS: 400.0000 mg | ORAL_TABLET | Freq: Two times a day (BID) | ORAL | Status: DC

## 2013-09-12 MED ORDER — ERGOCALCIFEROL 1.25 MG (50000 UT) PO CAPS: 50000.0000 [IU] | ORAL_CAPSULE | ORAL | Status: DC

## 2013-09-12 NOTE — Patient Instructions (Signed)

## 2013-09-12 NOTE — Progress Notes (Signed)
Patient ID: Anna Valentine, female   DOB: [birth year], 63 y.o.   MRN: [id number] 63 y.o. [id number] Married Caucasian Fe here for annual exam.  No new health concerns. Her last CXR this October was normal.  Her husband now has a retinal bleed and vision in one eye is limited.  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  No LMP recorded. Patient is postmenopausal.          Sexually active: yes  The current method of family planning is post menopausal status.    Exercising: yes  Gym/ health club routine includes mod to heavy weightlifting and running 4-5 times per week, walking dogs daily. Smoker:  no  Health Maintenance: Pap:  09/08/11, WNL, neg HR HPV MMG:  09/13/12, Bi-Rads 2: benign findings Colonoscopy:  09/10/10, repeat in 5 years BMD:  02/21/12, osteoporosis TDaP:  05/14/13 Labs:  HB:  12.6 Urine:  negative    reports that she quit smoking about 26 years ago. She has never used smokeless tobacco. She reports that she drinks about 2.0 ounces of alcohol per week. She reports that she does not use illicit drugs.  Past Medical History  Diagnosis Date  . Osteoporosis   . STD (sexually transmitted disease)     HSV  . GERD (gastroesophageal reflux disease)   . Breast cancer 08/2009    stage 2, rx with lumpectomy and xrt  . Lung cancer 06/06/05    stage 1 poorly differentiated adenocarcinoma, s/p right lower lobectomy.  Marland Kitchen BRCA negative 2011    BRCA I/ II negative    Past Surgical History  Procedure Laterality Date  . Lobectomy  06/06/2005    Right  . Tonsillectomy    . Tubal ligation  1984  . Bunionectomy    . Breast lumpectomy  10/2009    Left lumpectomy and radiation, stage II, ER/PR+, Her 2 nu negative    Current Outpatient Prescriptions  Medication Sig Dispense Refill  . acyclovir (ZOVIRAX) 400 MG tablet Take 1 tablet (400 mg total) by mouth 2 (two) times daily.  180 tablet  3  . anastrozole (ARIMIDEX) 1 MG tablet Take one tablet by mouth one time  daily  90 tablet  11  . aspirin 81 MG tablet Take 81 mg by mouth daily.      . Calcium Carbonate (CALCIUM 600 PO) Take 600 mg by mouth daily.        . ergocalciferol (VITAMIN D2) 50000 UNITS capsule Take 1 capsule (50,000 Units total) by mouth every 30 (thirty) days.  30 capsule  0  . omeprazole (PRILOSEC) 40 MG capsule       . Psyllium (METAMUCIL PO) Take by mouth daily.         No current facility-administered medications for this visit.    Family History  Problem Relation Age of Onset  . Allergies Mother   . Asthma Mother   . Breast cancer Mother   . Lung cancer Mother   . Breast cancer Mother     breast  . Colon cancer Father   . Colon cancer Father     colon  . Prostate cancer Brother   . Prostate cancer Brother     prostate  . Breast cancer Sister   . Breast cancer Sister     breast diagnosed at 69 with recurrence age 49 neg.  BRCA  . Breast cancer Maternal Grandmother   . Breast cancer Maternal Grandmother  breast    ROS:  Pertinent items are noted in HPI.  Otherwise, a comprehensive ROS was negative.  Exam:   BP 146/94  Pulse 64  Resp 16  Ht '5\' 6"'  (1.676 m)  Wt 119 lb (53.978 kg)  BMI 19.22 kg/m2 Height: '5\' 6"'  (167.6 cm)  Ht Readings from Last 3 Encounters:  09/12/13 '5\' 6"'  (1.676 m)  05/20/13 '5\' 6"'  (1.676 m)  05/14/13 '5\' 6"'  (1.676 m)    General appearance: alert, cooperative and appears stated age Head: Normocephalic, without obvious abnormality, atraumatic Neck: no adenopathy, supple, symmetrical, trachea midline and thyroid normal to inspection and palpation Lungs: clear to auscultation bilaterally Breasts: normal appearance, no masses or tenderness, surgical scar on the right without mass, surgical and radiation changes on the left. Heart: regular rate and rhythm Abdomen: soft, non-tender; no masses,  no organomegaly Extremities: extremities normal, atraumatic, no cyanosis or edema Skin: Skin color, texture, turgor normal. No rashes or  lesions Lymph nodes: Cervical, supraclavicular, and axillary nodes normal. No abnormal inguinal nodes palpated Neurologic: Grossly normal   Pelvic: External genitalia:  no lesions              Urethra:  normal appearing urethra with no masses, tenderness or lesions              Bartholin's and Skene's: normal                 Vagina: very atrophic appearing vagina with pale color and discharge, no lesions              Cervix: anteverted              Pap taken: no Bimanual Exam:  Uterus:  normal size, contour, position, consistency, mobility, non-tender              Adnexa: no mass, fullness, tenderness               Rectovaginal: Confirms               Anus:  normal sphincter tone, no lesions  A:  Well Woman with normal exam  Postmenopausal no HRT  S/P left breast cancer with lumpectomy and radiation 10/2009  S/P RLL cancer with thoracotomy 05/2005  [facility] of breast and colon cancer - she is BRCA neg  History of atrophic vaginitis  History of HSV, GERD, Osteopenia with Vit D deficiency  P:   Pap smear as per guidelines   Mammogram is due 09/2013  Refill Zovirax and Vit D for a year and will follow with labs.  Counseled on breast self exam, mammography screening, adequate intake of calcium and vitamin D, diet and exercise return annually or prn  An After Visit Summary was printed and given to the patient.

## 2013-09-13 DIAGNOSIS — E559 Vitamin D deficiency, unspecified: Secondary | ICD-10-CM | POA: Insufficient documentation

## 2013-09-13 DIAGNOSIS — M8580 Other specified disorders of bone density and structure, unspecified site: Secondary | ICD-10-CM

## 2013-09-13 DIAGNOSIS — N952 Postmenopausal atrophic vaginitis: Secondary | ICD-10-CM | POA: Insufficient documentation

## 2013-09-13 HISTORY — DX: Other specified disorders of bone density and structure, unspecified site: M85.80

## 2013-09-13 HISTORY — DX: Vitamin D deficiency, unspecified: E55.9

## 2013-09-13 LAB — VITAMIN D 25 HYDROXY (VIT D DEFICIENCY, FRACTURES): Vit D, 25-Hydroxy: 72 ng/mL (ref 30–89)

## 2013-09-16 ENCOUNTER — Ambulatory Visit
Admission: RE | Admit: 2013-09-16 | Discharge: 2013-09-16 | Disposition: A | Discharge status: Home / Self Care | Payer: Self-pay | Source: Ambulatory Visit | Attending: Oncology | Admitting: Oncology

## 2013-09-16 DIAGNOSIS — Z853 Personal history of malignant neoplasm of breast: Secondary | ICD-10-CM

## 2013-09-17 NOTE — Progress Notes (Signed)
Encounter reviewed by Dr. Brook Silva.  

## 2013-10-02 ENCOUNTER — Telehealth: Payer: Self-pay | Admitting: *Deleted

## 2013-10-02 NOTE — Telephone Encounter (Signed)
Received voice message from patient wanting to know if its OK to get the Shingles vaccine? Per Dr. Humphrey Rolls, its OK to get the vaccine. Patient verbalized understanding.

## 2013-11-14 ENCOUNTER — Ambulatory Visit (INDEPENDENT_AMBULATORY_CARE_PROVIDER_SITE_OTHER): Payer: BC Managed Care – PPO | Admitting: Internal Medicine

## 2013-11-14 ENCOUNTER — Encounter: Payer: Self-pay | Admitting: Internal Medicine

## 2013-11-14 VITALS — BP 150/94 | HR 88 | Temp 100.0°F | Wt 120.0 lb

## 2013-11-14 DIAGNOSIS — J209 Acute bronchitis, unspecified: Secondary | ICD-10-CM | POA: Diagnosis not present

## 2013-11-14 MED ORDER — LEVOFLOXACIN 500 MG PO TABS: 500.0000 mg | ORAL_TABLET | Freq: Every day | ORAL | Status: DC

## 2013-11-14 NOTE — Patient Instructions (Addendum)
Take Levaquin as directed for 10 days. Call if not better in 7-10 days

## 2013-11-14 NOTE — Progress Notes (Signed)
   Subjective:    Patient ID: Anna Valentine, female    DOB: 1951/03/10, 63 y.o.   MRN: [id number]  HPI Has come down with respiratory infection. Initially started as sore throat. Has developed into low-grade fever, fatigue and malaise. Has a cough with discolored sputum. No respiratory distress. History of bronchiectasis and lung cancer.    Review of Systems     Objective:   Physical Exam TMs are clear. Pharynx is clear. Neck is supple without adenopathy. Chest clear without rales.        Assessment & Plan:  Bronchitis  Plan: Levaquin 500 mg daily with food for 10 days. Patient has Hycodan as needed for cough.

## 2013-11-28 ENCOUNTER — Telehealth: Payer: Self-pay | Admitting: Adult Health

## 2013-12-02 ENCOUNTER — Other Ambulatory Visit: Payer: BC Managed Care – PPO

## 2013-12-02 ENCOUNTER — Ambulatory Visit: Payer: BC Managed Care – PPO | Admitting: Oncology

## 2013-12-04 ENCOUNTER — Ambulatory Visit: Payer: BC Managed Care – PPO | Admitting: Adult Health

## 2013-12-04 ENCOUNTER — Other Ambulatory Visit: Payer: BC Managed Care – PPO

## 2013-12-19 ENCOUNTER — Telehealth: Payer: Self-pay | Admitting: Oncology

## 2013-12-19 ENCOUNTER — Encounter: Payer: Self-pay | Admitting: Adult Health

## 2013-12-19 ENCOUNTER — Other Ambulatory Visit (HOSPITAL_BASED_OUTPATIENT_CLINIC_OR_DEPARTMENT_OTHER): Payer: BC Managed Care – PPO

## 2013-12-19 ENCOUNTER — Ambulatory Visit (HOSPITAL_BASED_OUTPATIENT_CLINIC_OR_DEPARTMENT_OTHER): Payer: BC Managed Care – PPO | Admitting: Adult Health

## 2013-12-19 VITALS — BP 149/87 | HR 80 | Temp 98.9°F | Resp 18 | Ht 66.0 in | Wt 118.9 lb

## 2013-12-19 DIAGNOSIS — Z79811 Long term (current) use of aromatase inhibitors: Secondary | ICD-10-CM

## 2013-12-19 DIAGNOSIS — M810 Age-related osteoporosis without current pathological fracture: Secondary | ICD-10-CM

## 2013-12-19 DIAGNOSIS — E2839 Other primary ovarian failure: Secondary | ICD-10-CM

## 2013-12-19 DIAGNOSIS — C773 Secondary and unspecified malignant neoplasm of axilla and upper limb lymph nodes: Secondary | ICD-10-CM

## 2013-12-19 DIAGNOSIS — Z85118 Personal history of other malignant neoplasm of bronchus and lung: Secondary | ICD-10-CM

## 2013-12-19 DIAGNOSIS — C50919 Malignant neoplasm of unspecified site of unspecified female breast: Secondary | ICD-10-CM

## 2013-12-19 DIAGNOSIS — Z853 Personal history of malignant neoplasm of breast: Secondary | ICD-10-CM

## 2013-12-19 LAB — CBC WITH DIFFERENTIAL/PLATELET
BASO%: 0.3 % (ref 0.0–2.0)
Basophils Absolute: 0 10*3/uL (ref 0.0–0.1)
EOS%: 1.8 % (ref 0.0–7.0)
Eosinophils Absolute: 0.1 10*3/uL (ref 0.0–0.5)
HCT: 36.1 % (ref 34.8–46.6)
HGB: 11.9 g/dL (ref 11.6–15.9)
LYMPH#: 1.1 10*3/uL (ref 0.9–3.3)
LYMPH%: 27.5 % (ref 14.0–49.7)
MCH: 29.7 pg (ref 25.1–34.0)
MCHC: 33 g/dL (ref 31.5–36.0)
MCV: 90 fL (ref 79.5–101.0)
MONO#: 0.5 10*3/uL (ref 0.1–0.9)
MONO%: 11.6 % (ref 0.0–14.0)
NEUT%: 58.8 % (ref 38.4–76.8)
NEUTROS ABS: 2.3 10*3/uL (ref 1.5–6.5)
Platelets: 252 10*3/uL (ref 145–400)
RBC: 4.01 10*6/uL (ref 3.70–5.45)
RDW: 13 % (ref 11.2–14.5)
WBC: 4 10*3/uL (ref 3.9–10.3)

## 2013-12-19 LAB — COMPREHENSIVE METABOLIC PANEL (CC13)
ALBUMIN: 3.6 g/dL (ref 3.5–5.0)
ALT: 8 U/L (ref 0–55)
AST: 18 U/L (ref 5–34)
Alkaline Phosphatase: 76 U/L (ref 40–150)
Anion Gap: 8 mEq/L (ref 3–11)
BUN: 13.2 mg/dL (ref 7.0–26.0)
CALCIUM: 9 mg/dL (ref 8.4–10.4)
CHLORIDE: 104 meq/L (ref 98–109)
CO2: 26 meq/L (ref 22–29)
Creatinine: 0.7 mg/dL (ref 0.6–1.1)
GLUCOSE: 95 mg/dL (ref 70–140)
Potassium: 4.1 mEq/L (ref 3.5–5.1)
Sodium: 138 mEq/L (ref 136–145)
Total Bilirubin: 0.6 mg/dL (ref 0.20–1.20)
Total Protein: 7 g/dL (ref 6.4–8.3)

## 2013-12-19 MED ORDER — ANASTROZOLE 1 MG PO TABS: 1.0000 mg | ORAL_TABLET | Freq: Every day | ORAL | Status: DC

## 2013-12-19 NOTE — Progress Notes (Signed)
Hematology and Oncology Follow Up Visit  Anna Valentine [id number] [birth year] 63 y.o. 12/20/2013 9:47 AM  CC: Dr. Margot Chimes,  [facility]  DIAGNOSIS: 63 year old female with   #1 stage I lung cancer poorly differentiated grade 2 status post right lower lobe wedge resection with complete lobectomy and mediastinal node biopsy in November 2006 at [facility]. No evidence of recurrent disease.  #2 left invasive lobular carcinoma status post lumpectomy and sentinel node biopsy on 10/12/2009. The final pathology revealed a 0.8 cm invasive lobular carcinoma with one of 4 lymph nodes positive for metastatic disease. Clinical stage II. The tumor was ER positive PR +2 6714% and HER-2/neu negative.  #3 Oncotype DX score was low score and therefore the patient was received radiation therapy followed by adjuvant hormonal therapy consisting of Arimidex 1 mg daily.   PAST THERAPY:  #1 status post lumpectomy for stage II invasive lobular carcinoma in April 2007.  #2 status post radiation therapy completed on 01/28/2010.  #3 currently on Arimidex 1 mg daily starting on 02/01/2010. Patient is planned for 5 years of therapy  Current therapy: Arimidex 1 mg daily.  Interim History:  Anna Valentine is here today for follow up of her h/o brest cancer.  She is taking Arimidex daily and is tolerating it well for the most part.  About 2 years ago, she did get some stiffness in her left third digit that required a cortisone injection from a hand specialist.  She did stop Arimidex for about 2 months and has since restarted it without any difficulty.  She does experience dry eyes at night and uses eye gtts PRN.  She does have a h/o lung cancer and sees Dr. [name] at [facility].  Her last appointment with him was one year ago in January.  She sees him every 18 months, and her next appointment is in July, 2015.  She does have a CT chest prior to her appointment with him.  She denies hot flashes, fevers, night sweats, joint aches,  vaginal dryness, new pain, or any further concerns.  We reviewed her health maintenance below.    Medications:  Current Outpatient Prescriptions  Medication Sig Dispense Refill  . acyclovir (ZOVIRAX) 400 MG tablet Take 1 tablet (400 mg total) by mouth 2 (two) times daily.  180 tablet  3  . anastrozole (ARIMIDEX) 1 MG tablet Take 1 tablet (1 mg total) by mouth daily.  30 tablet  11  . aspirin 81 MG tablet Take 81 mg by mouth daily.      . Calcium Carbonate (CALCIUM 600 PO) Take 600 mg by mouth daily.        . cholecalciferol (VITAMIN D) 1000 UNITS tablet Take 1,000 Units by mouth daily.      Marland Kitchen levofloxacin (LEVAQUIN) 500 MG tablet Take 1 tablet (500 mg total) by mouth daily.  10 tablet  0  . omeprazole (PRILOSEC) 40 MG capsule       . Psyllium (METAMUCIL PO) Take by mouth daily.         No current facility-administered medications for this visit.     Allergies:  Allergies  Allergen Reactions  . Clarithromycin     REACTION: rash    Past Medical History, Surgical history, Social history, and Family History were reviewed and updated.  Review of Systems: A 10 point review of systems was conducted and is otherwise negative except for what is noted above.    Health Maintenance  Mammogram: 09/2013 Colonoscopy:2012, repeat in 5 years recommended  Bone Density Scan: 02/2012, osteoporosis Pap Smear: 2014 Eye Exam: 05/2013 Vitamin D Level: 09/2013 Lipid Panel: unsure   Physical Exam:  Blood pressure 149/87, pulse 80, temperature 98.9 F (37.2 C), temperature source Oral, resp. rate 18, height _0  (1.676 m), weight 118 lb 14.4 oz (53.933 kg). GENERAL: Patient is a well appearing female in no acute distress HEENT:  Sclerae anicteric.  Oropharynx clear and moist. No ulcerations or evidence of oropharyngeal candidiasis. Neck is supple.  NODES:  No cervical, supraclavicular, or axillary lymphadenopathy palpated.  BREAST EXAM:  Deferred. LUNGS:  Clear to auscultation bilaterally.  No  wheezes or rhonchi. HEART:  Regular rate and rhythm. No murmur appreciated. ABDOMEN:  Soft, nontender.  Positive, normoactive bowel sounds. No organomegaly palpated. MSK:  No focal spinal tenderness to palpation. Full range of motion bilaterally in the upper extremities. EXTREMITIES:  No peripheral edema.   SKIN:  Clear with no obvious rashes or skin changes. No nail dyscrasia. NEURO:  Nonfocal. Well oriented.  Appropriate affect.   ECOG: 1  HEENT exam EOMI PERRLA sclerae anicteric no conjunctival pallor oral mucosa is moist neck is supple lungs are clear bilaterally well-healed surgical scar on the right side from her surgery. Cardiovascular is regular rate rhythm abdomen is soft nontender nondistended bowel sounds are present no HSM extremities no edema neuro patient's alert oriented otherwise nonfocal right breast no masses or nipple discharge left breast well-healed excisional scar no masses no nodularity no nipple discharge.  Lab Results: Lab Results  Component Value Date   WBC 4.0 12/19/2013   HGB 11.9 12/19/2013   HCT 36.1 12/19/2013   MCV 90.0 12/19/2013   PLT 252 12/19/2013     Chemistry      Component Value Date/Time   NA 138 12/19/2013 1346   NA 134* 09/03/2012 1552   NA 137 03/23/2010 1019   K 4.1 12/19/2013 1346   K 4.6 09/03/2012 1552   K 4.6 03/23/2010 1019   CL 103 11/22/2012 1025   CL 98 09/03/2012 1552   CL 99 03/23/2010 1019   CO2 26 12/19/2013 1346   CO2 29 09/03/2012 1552   CO2 29 03/23/2010 1019   BUN 13.2 12/19/2013 1346   BUN 18 09/03/2012 1552   BUN 16 03/23/2010 1019   CREATININE 0.7 12/19/2013 1346   CREATININE 0.68 09/03/2012 1552   CREATININE 0.70 11/14/2011 1031      Component Value Date/Time   CALCIUM 9.0 12/19/2013 1346   CALCIUM 9.9 09/03/2012 1552   CALCIUM 9.7 03/23/2010 1019   ALKPHOS 76 12/19/2013 1346   ALKPHOS 65 09/03/2012 1552   ALKPHOS 72 03/23/2010 1019   AST 18 12/19/2013 1346   AST 18 09/03/2012 1552   AST 33 03/23/2010 1019   ALT 8 12/19/2013 1346    ALT 10 09/03/2012 1552   ALT 26 03/23/2010 1019   BILITOT 0.60 12/19/2013 1346   BILITOT 0.5 09/03/2012 1552   BILITOT 0.60 03/23/2010 1019       Radiological Studies:  No results found.   assessment AND PLAN: A 63 y.o. female with   #1 stage II invasive lobular carcinoma that was ER/PR positive HER-2/neu negative. Patient is status post radiation therapy.  She has been taking Arimidex daily since July, 2011 and is tolerating it well.  Her CBC is stable today.  I reviewed this with her in detail.  A CMP is pending.    #2 history of lung cancer she is followed at [facility] and has repeat f/u  in July, 2015.    #3 Osteoporosis.  Bone density in August 2013 is concerning for early osteoporosis.  She is taking calcium, vitamin d and performing weight bearing exercises.  I ordered a repeat bone density today and we did discuss the possibiity of bisphosphanate therapy in the future.    Anna Valentine will return in 6 months for labs and evaluation.   She knows to call us in the interim for any questions or concerns.  We can certainly see her sooner if needed.  I spent 25 minutes counseling the patient face to face.  The total time spent in the appointment was 30 minutes.  Minette Headland, [place] [phone] 6/12/20159:47 AM

## 2013-12-19 NOTE — Telephone Encounter (Signed)
, °

## 2013-12-19 NOTE — Patient Instructions (Signed)
You are doing well.  You have no sign of recurrence.  Continue taking Arimidex and Calcium/Vitamin D/performing weight bearing exercises.  I recommend a healthy diet, exercise, and monthly self breast exams.  We will see you back in 6 months.  Please call us if you have any questions or concerns.    Breast Self-Awareness Practicing breast self-awareness may pick up problems early, prevent significant medical complications, and possibly save your life. By practicing breast self-awareness, you can become familiar with how your breasts look and feel and if your breasts are changing. This allows you to notice changes early. It can also offer you some reassurance that your breast health is good. One way to learn what is normal for your breasts and whether your breasts are changing is to do a breast self-exam. If you find a lump or something that was not present in the past, it is best to contact your caregiver right away. Other findings that should be evaluated by your caregiver include nipple discharge, especially if it is bloody; skin changes or reddening; areas where the skin seems to be pulled in (retracted); or new lumps and bumps. Breast pain is seldom associated with cancer (malignancy), but should also be evaluated by a caregiver. HOW TO PERFORM A BREAST SELF-EXAM The best time to examine your breasts is 5 7 days after your menstrual period is over. During menstruation, the breasts are lumpier, and it may be more difficult to pick up changes. If you do not menstruate, have reached menopause, or had your uterus removed (hysterectomy), you should examine your breasts at regular intervals, such as monthly. If you are breastfeeding, examine your breasts after a feeding or after using a breast pump. Breast implants do not decrease the risk for lumps or tumors, so continue to perform breast self-exams as recommended. Talk to your caregiver about how to determine the difference between the implant and breast  tissue. Also, talk about the amount of pressure you should use during the exam. Over time, you will become more familiar with the variations of your breasts and more comfortable with the exam. A breast self-exam requires you to remove all your clothes above the waist. 1. Look at your breasts and nipples. Stand in front of a mirror in a room with good lighting. With your hands on your hips, push your hands firmly downward. Look for a difference in shape, contour, and size from one breast to the other (asymmetry). Asymmetry includes puckers, dips, or bumps. Also, look for skin changes, such as reddened or scaly areas on the breasts. Look for nipple changes, such as discharge, dimpling, repositioning, or redness. 2. Carefully feel your breasts. This is best done either in the shower or tub while using soapy water or when flat on your back. Place the arm (on the side of the breast you are examining) above your head. Use the pads (not the fingertips) of your three middle fingers on your opposite hand to feel your breasts. Start in the underarm area and use  inch (2 cm) overlapping circles to feel your breast. Use 3 different levels of pressure (light, medium, and firm pressure) at each circle before moving to the next circle. The light pressure is needed to feel the tissue closest to the skin. The medium pressure will help to feel breast tissue a little deeper, while the firm pressure is needed to feel the tissue close to the ribs. Continue the overlapping circles, moving downward over the breast until you feel your ribs  below your breast. Then, move one finger-width towards the center of the body. Continue to use the  inch (2 cm) overlapping circles to feel your breast as you move slowly up toward the collar bone (clavicle) near the base of the neck. Continue the up and down exam using all 3 pressures until you reach the middle of the chest. Do this with each breast, carefully feeling for lumps or changes. 3.   Keep a written record with breast changes or normal findings for each breast. By writing this information down, you do not need to depend only on memory for size, tenderness, or location. Write down where you are in your menstrual cycle, if you are still menstruating. Breast tissue can have some lumps or thick tissue. However, see your caregiver if you find anything that concerns you.  SEEK MEDICAL CARE IF:  You see a change in shape, contour, or size of your breasts or nipples.   You see skin changes, such as reddened or scaly areas on the breasts or nipples.   You have an unusual discharge from your nipples.   You feel a new lump or unusually thick areas.  Document Released: 06/27/2005 Document Revised: 06/13/2012 Document Reviewed: 10/12/2011 [facility] Patient Information 2014 [employer].

## 2014-01-07 ENCOUNTER — Ambulatory Visit (INDEPENDENT_AMBULATORY_CARE_PROVIDER_SITE_OTHER): Payer: BC Managed Care – PPO | Admitting: Internal Medicine

## 2014-01-07 ENCOUNTER — Encounter: Payer: Self-pay | Admitting: Internal Medicine

## 2014-01-07 VITALS — BP 138/90 | HR 76 | Temp 99.1°F | Wt 118.0 lb

## 2014-01-07 DIAGNOSIS — Z8709 Personal history of other diseases of the respiratory system: Secondary | ICD-10-CM | POA: Diagnosis not present

## 2014-01-07 DIAGNOSIS — M545 Low back pain, unspecified: Secondary | ICD-10-CM | POA: Diagnosis not present

## 2014-01-07 DIAGNOSIS — R059 Cough, unspecified: Secondary | ICD-10-CM

## 2014-01-07 DIAGNOSIS — R05 Cough: Secondary | ICD-10-CM

## 2014-01-07 MED ORDER — HYDROCODONE-ACETAMINOPHEN 5-325 MG PO TABS: 1.0000 | ORAL_TABLET | Freq: Two times a day (BID) | ORAL | Status: DC

## 2014-01-07 MED ORDER — LEVOFLOXACIN 500 MG PO TABS: 500.0000 mg | ORAL_TABLET | Freq: Every day | ORAL | Status: DC

## 2014-01-07 MED ORDER — CYCLOBENZAPRINE HCL 10 MG PO TABS: 10.0000 mg | ORAL_TABLET | Freq: Every day | ORAL | Status: DC

## 2014-01-07 NOTE — Patient Instructions (Signed)
Take Levaquin 500 milligrams daily for 10 days for persistent cough and bronchiectasis. Take Flexeril 10 mg at bedtime for back pain. Hydrocodone/APAP as needed for pain
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
## 2014-04-23 ENCOUNTER — Ambulatory Visit (INDEPENDENT_AMBULATORY_CARE_PROVIDER_SITE_OTHER): Payer: BC Managed Care – PPO | Admitting: Internal Medicine

## 2014-04-23 DIAGNOSIS — Z23 Encounter for immunization: Secondary | ICD-10-CM

## 2014-05-12 ENCOUNTER — Encounter: Payer: Self-pay | Admitting: Internal Medicine

## 2014-06-03 ENCOUNTER — Encounter: Payer: Self-pay | Admitting: Internal Medicine

## 2014-06-03 ENCOUNTER — Ambulatory Visit (INDEPENDENT_AMBULATORY_CARE_PROVIDER_SITE_OTHER): Payer: BC Managed Care – PPO | Admitting: Internal Medicine

## 2014-06-03 VITALS — BP 122/82 | HR 90 | Temp 99.6°F | Ht 66.0 in | Wt 118.0 lb

## 2014-06-03 DIAGNOSIS — J029 Acute pharyngitis, unspecified: Secondary | ICD-10-CM

## 2014-06-03 LAB — POCT RAPID STREP A (OFFICE): RAPID STREP A SCREEN: NEGATIVE

## 2014-06-03 MED ORDER — LEVOFLOXACIN 500 MG PO TABS: 500.0000 mg | ORAL_TABLET | Freq: Every day | ORAL | Status: DC

## 2014-06-03 NOTE — Patient Instructions (Signed)
Take Levaquin daily as directed with food. Take Hycodan as needed for cough. Call if not better in 7-10 days or sooner if worse.

## 2014-06-03 NOTE — Progress Notes (Signed)
   Subjective:    Patient ID: Anna Valentine, female    DOB: 1951/05/11, 63 y.o.   MRN: [id number]  HPI Onset November 21 of sore throat and cough. Sputum has been  discolored. Low-grade fever. No shaking chills. Husband having medical issues with syncope, low white blood cell count, possible arrhythmia, stroke. This is been stressful for her. She's also been around a small child whose father had strep throat. She is concerned about this today. Has a bit of a sore throat still.    Review of Systems     Objective:   Physical Exam  Skin warm and dry. Nodes none. Pharynx injected. Rapid strep screen negative. Left TM full. Right TM clear. Neck is supple. Chest clear to auscultation.      Assessment & Plan:  Acute URI  Left serous otitis media  Plan: Levaquin 500 milligrams daily for 10 days. She has Hycodan left over from previous prescription to take for cough.

## 2014-06-09 ENCOUNTER — Telehealth: Payer: Self-pay | Admitting: Internal Medicine

## 2014-06-09 NOTE — Telephone Encounter (Signed)
She still has antibiotic left to finish 10 day course. We can prescribe 4 oz of Hycodan- offered at visit but she had some from previous prescription. Needs to give this a few more days finishing antibiotic.

## 2014-06-09 NOTE — Telephone Encounter (Signed)
The cough syrup that she already had is now gone and she is feeling better.  However, the cough is still really bad.  She is still pretty hoarse at times.  Cough is dry, hacky and is the worst at night.  Would you prescribe her more or do you need to see her first?    Hydromet is what she had.    Pharmacy:  [facility] @ [place].

## 2014-06-09 NOTE — Telephone Encounter (Signed)
Patient informed to give antibiotic more time to get better, if not she would need to be seen again.  Patient understands.

## 2014-06-16 ENCOUNTER — Other Ambulatory Visit (HOSPITAL_BASED_OUTPATIENT_CLINIC_OR_DEPARTMENT_OTHER): Payer: BC Managed Care – PPO

## 2014-06-16 ENCOUNTER — Ambulatory Visit (HOSPITAL_BASED_OUTPATIENT_CLINIC_OR_DEPARTMENT_OTHER): Payer: BC Managed Care – PPO | Admitting: Oncology

## 2014-06-16 ENCOUNTER — Telehealth: Payer: Self-pay | Admitting: Oncology

## 2014-06-16 VITALS — BP 150/59 | HR 70 | Temp 98.0°F | Resp 19 | Ht 66.0 in | Wt 119.2 lb

## 2014-06-16 DIAGNOSIS — Z170 Estrogen receptor positive status [ER+]: Secondary | ICD-10-CM

## 2014-06-16 DIAGNOSIS — C50412 Malignant neoplasm of upper-outer quadrant of left female breast: Secondary | ICD-10-CM

## 2014-06-16 DIAGNOSIS — Z853 Personal history of malignant neoplasm of breast: Secondary | ICD-10-CM

## 2014-06-16 DIAGNOSIS — C50212 Malignant neoplasm of upper-inner quadrant of left female breast: Secondary | ICD-10-CM

## 2014-06-16 DIAGNOSIS — M810 Age-related osteoporosis without current pathological fracture: Secondary | ICD-10-CM

## 2014-06-16 DIAGNOSIS — Z85118 Personal history of other malignant neoplasm of bronchus and lung: Secondary | ICD-10-CM

## 2014-06-16 LAB — CBC WITH DIFFERENTIAL/PLATELET
BASO%: 0.6 % (ref 0.0–2.0)
Basophils Absolute: 0 10*3/uL (ref 0.0–0.1)
EOS%: 1.9 % (ref 0.0–7.0)
Eosinophils Absolute: 0.1 10*3/uL (ref 0.0–0.5)
HEMATOCRIT: 34.8 % (ref 34.8–46.6)
HGB: 11.2 g/dL — ABNORMAL LOW (ref 11.6–15.9)
LYMPH%: 29 % (ref 14.0–49.7)
MCH: 29.6 pg (ref 25.1–34.0)
MCHC: 32.2 g/dL (ref 31.5–36.0)
MCV: 91.8 fL (ref 79.5–101.0)
MONO#: 0.3 10*3/uL (ref 0.1–0.9)
MONO%: 9.2 % (ref 0.0–14.0)
NEUT#: 2.1 10*3/uL (ref 1.5–6.5)
NEUT%: 59.3 % (ref 38.4–76.8)
PLATELETS: 460 10*3/uL — AB (ref 145–400)
RBC: 3.79 10*6/uL (ref 3.70–5.45)
RDW: 12.8 % (ref 11.2–14.5)
WBC: 3.6 10*3/uL — ABNORMAL LOW (ref 3.9–10.3)
lymph#: 1 10*3/uL (ref 0.9–3.3)

## 2014-06-16 LAB — COMPREHENSIVE METABOLIC PANEL (CC13)
ALT: 12 U/L (ref 0–55)
AST: 22 U/L (ref 5–34)
Albumin: 3.4 g/dL — ABNORMAL LOW (ref 3.5–5.0)
Alkaline Phosphatase: 77 U/L (ref 40–150)
Anion Gap: 8 mEq/L (ref 3–11)
BILIRUBIN TOTAL: 0.27 mg/dL (ref 0.20–1.20)
BUN: 14 mg/dL (ref 7.0–26.0)
CO2: 27 mEq/L (ref 22–29)
Calcium: 9.3 mg/dL (ref 8.4–10.4)
Chloride: 103 mEq/L (ref 98–109)
Creatinine: 0.7 mg/dL (ref 0.6–1.1)
EGFR: >90 mL/min/{1.73_m2} (ref >90)
Glucose: 86 mg/dl (ref 70–140)
Potassium: 4.3 mEq/L (ref 3.5–5.1)
Sodium: 138 mEq/L (ref 136–145)
Total Protein: 7 g/dL (ref 6.4–8.3)

## 2014-06-16 NOTE — Progress Notes (Signed)
Anna Valentine  [phone] Fax:([phone]     ID: Anna Valentine DOB: 1951-05-05  MR#: [id number]  QIH#:[id number]  Patient Care Team: [name], MD as PCP - General (Internal Medicine) PCP: [name], MD GYN: Silvestre Moment SU: Osborn Coho MD] [name] MD OTHER MD: [name] M.D.  CHIEF COMPLAINT: (1) Estrogen receptor positive breast cancer    (2) history of stage IA right lung adenocarcinoma status post resection November 2006   CURRENT TREATMENT: Anastrozole   BREAST CANCER HISTORY:  From Dr. [name] original intake note 07/26/2005: (Lung cancer presentation)  "The patient is a very pleasant 63 year old female without a significant past medical history, who states that in 04/2005 she felt a lump in the left neck.  It did not resolve spontaneously, so she was seen by Dr. [name], and a CT scan of the neck was performed on 04/25/05.  There was noted to be two lymph nodes on the left, and they were normal in size, but they were asymmetrical.  No other pathologic findings were noted, except for left internal jugular vein, which was atypically positioned.  However, because of these enlarged lymph nodes, a CT scan of the chest was also obtained by Dr. Renold Genta on 04/27/05, and the CT scan did reveal a poorly defined nodular opacity medially in the right upper lobe, measuring 6.5 mg anterior to posterior.  Within the right lower lobe there was noted to be a lobular nodular mass measuring 13 x 13 mm, and was felt to be worrisome for a primary lung carcinoma.  No other nodules or effusions were seen on the left lung.  On the mediastinal window images there was slight prominence of right hilar nodes, but no other evidence of mediastinal or hilar adenopathy.  A PET scan was thereafter obtained on 05/06/05.  The PET scan did reveal increased FDG activity within a small mass in the posterior superior right lower lobe, highly suspicious  for a malignancy.  No significant nodal FDG uptake was identified within the hilar regions or the mediastinum.  Also noted was abnormal FDG uptake in the right middle lobe, corresponding to the CT scan findings, and again suspicious for a malignancy.  There was no evidence of metastatic disease within the neck, abdomen or pelvis.  The patient was then referred to [facility], and was seen by Dr. [name].  The patient underwent a right lower lobe wedge resection with completion lobectomy and mediastinal lymph node biopsies.  The pathology revealed the following:  Right upper lobe wedge resection revealed pulmonary tissue with necrotizing granulomatous inflammation.  No evidence of malignancy.  Right middle lobe wedge revealed again pulmonary tissue with necrotizing granulomatous inflammation.  No evidence of malignancy.  The right pleural lobe (lobectomy) revealed an adenocarcinoma, poorly differentiated, grade 3, measuring 1.2 cm.  The visceral pleura was negative.  Chest wall negative.  Mediastinum negative.  All margins were negative, including the pleural and parenchymal margins.  There was no evidence of lymphovascular invasion.  In all lymph nodes sampled, including level 11, level 7, level 12, 4R and 2R lymph nodes negative for malignancy.  The patient was staged as T1 N0 M0, stage IA adenocarcinoma of the right lung.  Postoperatively the patient's course was complicated by the development of pleural effusion, requiring diuresis.  She had multiple x-rays performed.  Last x-ray performed on 07/07/05 revealed persistent right pleural effusion.  It was recommended that the patient [facility].  Dr. [name] Baxley kindly refers the patient to me today for medical oncology evaluation.  Clinically, the patient states that she has recovered well.  She is once again walking.  She was a runner, and she is trying to get back in shape."  From [name]'s 09/30/2009 note: (Breast cancer  presentation):  "She tells me that most recently she had her yearly screening mammogram performed that revealed an abnormality.  She also on exam was noted to have a palpable lump in the upper left breast as well.  Because of the abnormality, patient on September 02, 2009 had a digital diagnostic mammogram of the left breast and ultrasound of the left breast.  The mammogram revealed a 7.0 mm area slightly increased density in the upper left breast.  There were no suspicious calcifications noted.  The breast was heterogeneously dense.  Patient then went onto have an ultrasound of the breast performed and again a 7.0 mm irregular hypoechoic shadowing mass was noted in the 12 o'clock position of the left breast 5.0 cm from the nipple.  There were no enlarged or abnormal left axillary lymph nodes identified.  Patient went onto have a core biopsy performed on 09/02/2009 [phone]).  The needle core biopsy of the 12 o'clock mass revealed an invasive mammary carcinoma, low to intermediate grade.  It was lobular carcinoma.  Confirmatory immunohistochemical stains revealed the tumor to be strongly positive for cytokeratin AE1-AE3 with an infiltrative pattern of growth and the tumor was negative for E-cadherin stain confirming a lobular phenotype.  The tumor was estrogen receptor positive at 98%, progesterone receptor positive 6%, proliferation marker Ki-67 by MIB was 14%.  The tumor did not express HER-2/neu by CISH with a signal of 1.04.  Patient was seen by Dr. Neldon Mc on 09/08/2009 for discussion of surgical options.  His recommendation was a lumpectomy.  Patient also had bilateral MRI of the breasts performed, which revealed a 0.8 cm mildly enlarged area of mass-like enhancement at the 12 o'clock location corresponding to the biopsy-proven breast cancer.  There was no evidence of lymphadenopathy."  Her subsequent history is detailed below  INTERVAL HISTORY: Anna Valentine returns today for follow-up of her  breast cancer. She is establishing herself on my service today. She continues on anastrozole. She tells me hot flashes and vaginal dryness are not a major concern. She never developed the arthralgias and myalgias that some patients can experience on this drug. Since her last visit here she visited Dr. [name] at [facility] (01/21/2014). Chest CT that day showed no interval change  REVIEW OF SYSTEMS: A detailed review of systems today was entirely noncontributory. She runs several times a week for exercise.  PAST MEDICAL HISTORY: Past Medical History  Diagnosis Date  . Osteoporosis   . STD (sexually transmitted disease)     HSV  . GERD (gastroesophageal reflux disease)   . Breast cancer 08/2009    stage 2, rx with lumpectomy and xrt  . Lung cancer 06/06/05    stage 1 poorly differentiated adenocarcinoma, s/p right lower lobectomy.  Marland Kitchen BRCA negative 2011    BRCA I/ II negative    PAST SURGICAL HISTORY: Past Surgical History  Procedure Laterality Date  . Lobectomy  06/06/2005    Right  . Tonsillectomy    . Tubal ligation  1984  . Bunionectomy    . Breast lumpectomy  10/2009    Left lumpectomy and radiation, stage II, ER/PR+, Her 2 nu negative    FAMILY HISTORY  Family History  Problem Relation Age of Onset  . Allergies Mother   . Asthma Mother   . Breast cancer Mother   . Lung cancer Mother   . Breast cancer Mother     breast  . Colon cancer Father   . Colon cancer Father     colon  . Prostate cancer Brother   . Prostate cancer Brother     prostate  . Breast cancer Sister   . Breast cancer Sister     breast diagnosed at 63 with recurrence age 44 neg.  BRCA  . Breast cancer Maternal Grandmother   . Breast cancer Maternal Grandmother     breast    GYNECOLOGIC HISTORY:  No LMP recorded. Patient is postmenopausal. Menarche age 30, the patient is GX P0. She went through the change of life in approximately age 18. She did not take hormone replacement.  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 It's just the 2 of them at home. The patient is a Tourist information centre manager    ADVANCED DIRECTIVES: In place   HEALTH MAINTENANCE: History  Substance Use Topics  . Smoking status: Former Smoker -- 2.00 packs/day for 18 years    Quit date: 07/12/1987  . Smokeless tobacco: Never Used  . Alcohol Use: 2.0 oz/week    4 drink(s) per week    Allergies  Allergen Reactions  . Clarithromycin     REACTION: rash    Current Outpatient Prescriptions  Medication Sig Dispense Refill  . acyclovir (ZOVIRAX) 400 MG tablet Take 1 tablet (400 mg total) by mouth 2 (two) times daily. 180 tablet 3  . albuterol (PROAIR HFA) 108 (90 BASE) MCG/ACT inhaler     . alendronate (FOSAMAX) 70 MG tablet     . anastrozole (ARIMIDEX) 1 MG tablet Take 1 tablet (1 mg total) by mouth daily. 30 tablet 11  . aspirin 81 MG tablet Take 81 mg by mouth daily.    . Calcium Carbonate (CALCIUM 600 PO) Take 600 mg by mouth daily.      . cholecalciferol (VITAMIN D) 1000 UNITS tablet Take 1,000 Units by mouth daily.    . cyclobenzaprine (FLEXERIL) 10 MG tablet Take 1 tablet (10 mg total) by mouth at bedtime. 30 tablet 0  . HYDROcodone-acetaminophen (NORCO) 5-325 MG per tablet Take 1 tablet by mouth 2 (two) times daily. 30 tablet 0  . levofloxacin (LEVAQUIN) 500 MG tablet Take 1 tablet (500 mg total) by mouth daily. 10 tablet 0  . omeprazole (PRILOSEC) 40 MG capsule     . Psyllium (METAMUCIL PO) Take by mouth daily.       No current facility-administered medications for this visit.    OBJECTIVE: Middle-aged white woman who appears well Filed Vitals:   06/16/14 1110  BP: 150/59  Pulse: 70  Temp: 98 F (36.7 C)  Resp: 19     Body mass index is 19.25 kg/(m^2).    ECOG FS:0 - Asymptomatic  Ocular: Sclerae unicteric, pupils equal, round and reactive to light Ear-nose-throat: Oropharynx clear, teeth in good repair Lymphatic: No cervical or supraclavicular  adenopathy Lungs no rales or rhonchi, good excursion bilaterally Heart regular rate and rhythm, no murmur appreciated Abd soft, nontender, positive bowel sounds MSK no focal spinal tenderness, no joint edema Neuro: non-focal, well-oriented, appropriate affect Breasts: The right breast is unremarkable. The left breast is status post lumpectomy and radiation. There is no evidence of local recurrence. The left axilla  is benign.   LAB RESULTS:  CMP     Component Value Date/Time   NA 138 06/16/2014 1100   NA 134* 09/03/2012 1552   NA 137 03/23/2010 1019   K 4.3 06/16/2014 1100   K 4.6 09/03/2012 1552   K 4.6 03/23/2010 1019   CL 103 11/22/2012 1025   CL 98 09/03/2012 1552   CL 99 03/23/2010 1019   CO2 27 06/16/2014 1100   CO2 29 09/03/2012 1552   CO2 29 03/23/2010 1019   GLUCOSE 86 06/16/2014 1100   GLUCOSE 94 11/22/2012 1025   GLUCOSE 96 09/03/2012 1552   GLUCOSE 95 03/23/2010 1019   BUN 14.0 06/16/2014 1100   BUN 18 09/03/2012 1552   BUN 16 03/23/2010 1019   CREATININE 0.7 06/16/2014 1100   CREATININE 0.68 09/03/2012 1552   CREATININE 0.70 11/14/2011 1031   CALCIUM 9.3 06/16/2014 1100   CALCIUM 9.9 09/03/2012 1552   CALCIUM 9.7 03/23/2010 1019   PROT 7.0 06/16/2014 1100   PROT 6.8 09/03/2012 1552   PROT 6.9 03/23/2010 1019   ALBUMIN 3.4* 06/16/2014 1100   ALBUMIN 4.1 09/03/2012 1552   AST 22 06/16/2014 1100   AST 18 09/03/2012 1552   AST 33 03/23/2010 1019   ALT 12 06/16/2014 1100   ALT 10 09/03/2012 1552   ALT 26 03/23/2010 1019   ALKPHOS 77 06/16/2014 1100   ALKPHOS 65 09/03/2012 1552   ALKPHOS 72 03/23/2010 1019   BILITOT 0.27 06/16/2014 1100   BILITOT 0.5 09/03/2012 1552   BILITOT 0.60 03/23/2010 1019   GFRNONAA >60 10/07/2009 1435   GFRAA  10/07/2009 1435    >60        The eGFR has been calculated using the MDRD equation. This calculation has not been validated in all clinical situations. eGFR's persistently <60 mL/min signify possible Chronic  Kidney Disease.    INo results found for: SPEP, UPEP  Lab Results  Component Value Date   WBC 3.6* 06/16/2014   NEUTROABS 2.1 06/16/2014   HGB 11.2* 06/16/2014   HCT 34.8 06/16/2014   MCV 91.8 06/16/2014   PLT 460* 06/16/2014      Chemistry      Component Value Date/Time   NA 138 06/16/2014 1100   NA 134* 09/03/2012 1552   NA 137 03/23/2010 1019   K 4.3 06/16/2014 1100   K 4.6 09/03/2012 1552   K 4.6 03/23/2010 1019   CL 103 11/22/2012 1025   CL 98 09/03/2012 1552   CL 99 03/23/2010 1019   CO2 27 06/16/2014 1100   CO2 29 09/03/2012 1552   CO2 29 03/23/2010 1019   BUN 14.0 06/16/2014 1100   BUN 18 09/03/2012 1552   BUN 16 03/23/2010 1019   CREATININE 0.7 06/16/2014 1100   CREATININE 0.68 09/03/2012 1552   CREATININE 0.70 11/14/2011 1031      Component Value Date/Time   CALCIUM 9.3 06/16/2014 1100   CALCIUM 9.9 09/03/2012 1552   CALCIUM 9.7 03/23/2010 1019   ALKPHOS 77 06/16/2014 1100   ALKPHOS 65 09/03/2012 1552   ALKPHOS 72 03/23/2010 1019   AST 22 06/16/2014 1100   AST 18 09/03/2012 1552   AST 33 03/23/2010 1019   ALT 12 06/16/2014 1100   ALT 10 09/03/2012 1552   ALT 26 03/23/2010 1019   BILITOT 0.27 06/16/2014 1100   BILITOT 0.5 09/03/2012 1552   BILITOT 0.60 03/23/2010 1019       Lab Results  Component Value Date   LABCA2  18 09/30/2009    No components found for: [id number]  No results for input(s): INR in the last 168 hours.  Urinalysis    Component Value Date/Time   BILIRUBINUR neg 09/12/2013 1012   PROTEINUR neg 09/12/2013 1012   UROBILINOGEN negative 09/12/2013 1012   NITRITE neg 09/12/2013 1012   LEUKOCYTESUR Negative 09/12/2013 1012    STUDIES: No results found.  Results of [facility] chest CT from 01/21/2014 reviewed with the patient  ASSESSMENT: 63 y.o. Anna Valentine, Anna Valentine woman status post right upper lobe wedge resection, middle lobe wedge resection, lower lobectomy and mediastinal lymph node dissection 06/06/2005 for a 1.2 cm grade 3  adenocarcinoma, pT1 pN1, stage Ia  (1) status post left breast upper outer quadrant biopsy 09/02/2009 for an invasive lobular carcinoma (E-cadherin negative) estrogen receptor 98% positive, progesterone receptor 6% positive, with an MIB-1 of 14% and no HER-2 amplification. [S [id number]]  (2) status post left lumpectomy and sentinel lymph node sampling 10/12/2009 for a pT1b pN1, stage IIA invasive lobular breast cancer, with negative margins.  (3) Oncotype DX score of 16 predicts a risk of outside the breast recurrence of 10% if the patient's only systemic treatment is tamoxifen for 5 years. It also predicts no benefit from adjuvant chemotherapy  (4) completed adjuvant radiation therapy 01/28/2010, receiving 5040 cGy to the left breast, with a boost to the upper inner aspect of the breast (to a cumulative dose of 6300 cGy); the axillary and supraclavicular regions received 4500 cGy  (5) started anastrozole July 2011  (6) bone density 02/21/2012 at [facility] showed osteoporosis with a T score of -2.5; on alendronate  PLAN: I spent well over an hour reviewing the data on Ailynn, which is detailed above. In brief: She underwent resection of an early stage poorly differentiated adenocarcinoma November 2006, with no evidence of recurrence since. The tumor is almost certainly cured.  In April 2011 she underwent left breast surgery for a stage IIA invasive ductal carcinoma, with a low Oncotype score. Appropriately she chose to forego radiation and has been on anastrozole for 4-1/2 years.  We discussed the fact that anastrozole works better than tamoxifen compared head-to-head. Accordingly if the risk of recurrence after 5 years of tamoxifen was calculated to be 10%, the risk of recurrence after 5 years of anastrozole is more likely 7%.  Anna Valentine will complete 5 years of anastrozole therapy in July 2016. At this point we do not have category 1 data showing that prolonging aromatase inhibitors beyond 5  years is helpful. In the absence of such data in my practice generally is to stop anastrozole at 5 years.  She is slightly overdue for repeat bone density and that also is being scheduled.  Some patients would like to be released from follow-up at that point and others to continue NA survivorship clinic. Anna Valentine was very clear she would like to "get free" of cancer follow-up. Accordingly she will be released to her primary care physician after her July visit here.  The patient has a good understanding of the overall plan. She agrees with it. She knows the goal of treatment in her case is cure. She will call with any problems that may develop before her next visit here.  Chauncey Cruel, MD   06/16/2014 4:27 PM

## 2014-06-16 NOTE — Telephone Encounter (Signed)
per pof to sch pt appt-gave pt copy of sch °

## 2014-06-17 ENCOUNTER — Telehealth: Payer: Self-pay | Admitting: Oncology

## 2014-06-17 NOTE — Telephone Encounter (Signed)
pe pof to sch pt appt-made appt @ [facility]-11/11/2014 @3 -cld pt & left message of appt times & date & location

## 2014-06-18 ENCOUNTER — Telehealth: Payer: Self-pay | Admitting: Internal Medicine

## 2014-06-18 NOTE — Telephone Encounter (Signed)
Patient called - has sore & very red throat. [facility] no available appts & suggested Urgent Care.  Patient rqst a call back.  [phone]

## 2014-06-19 ENCOUNTER — Encounter: Payer: Self-pay | Admitting: Internal Medicine

## 2014-06-19 ENCOUNTER — Ambulatory Visit (INDEPENDENT_AMBULATORY_CARE_PROVIDER_SITE_OTHER): Payer: BC Managed Care – PPO | Admitting: Internal Medicine

## 2014-06-19 VITALS — BP 130/78 | HR 75 | Temp 98.4°F | Wt 120.0 lb

## 2014-06-19 DIAGNOSIS — J029 Acute pharyngitis, unspecified: Secondary | ICD-10-CM

## 2014-06-19 LAB — POCT RAPID STREP A (OFFICE): Rapid Strep A Screen: NEGATIVE

## 2014-06-19 MED ORDER — HYDROCOD POLST-CHLORPHEN POLST 10-8 MG/5ML PO LQCR: 5.0000 mL | Freq: Two times a day (BID) | ORAL | Status: DC | PRN

## 2014-06-19 MED ORDER — AZITHROMYCIN 250 MG PO TABS: ORAL_TABLET | ORAL | Status: DC

## 2014-06-19 NOTE — Patient Instructions (Signed)
Take Zithromax as directed and tussionex as directed.

## 2014-06-19 NOTE — Telephone Encounter (Signed)
Ask pt to come at 4:45 pm

## 2014-06-19 NOTE — Telephone Encounter (Signed)
Patient called again - Still experiencing scratchy throat - states she will no go to urgent care - advise still no available appts found.  I did have a 2:45 cancel appt for tomorrow.  Can I schedule her.  Advised I would call her back.

## 2014-06-20 ENCOUNTER — Ambulatory Visit: Payer: BC Managed Care – PPO | Admitting: Internal Medicine

## 2014-06-23 ENCOUNTER — Ambulatory Visit: Payer: BC Managed Care – PPO | Admitting: Internal Medicine

## 2014-06-24 ENCOUNTER — Encounter: Payer: Self-pay | Admitting: Internal Medicine

## 2014-06-24 NOTE — Progress Notes (Signed)
   Subjective:    Patient ID: Anna Valentine, female    DOB: [birth year], 63 y.o.   MRN: [id number]  HPI  Patient in today with scratchy throat. Husband has seen hematologist/ oncologist. Possibly has myelodysplasia. Has not been feeling well. Is to have a bone marrow biopsy in the near future. Patient has come down with scratchy throat. 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 I think this is where she is getting exposed to recurrent respiratory infections. She has a remote history of lung cancer treated at [facility] and has done well.    Review of Systems     Objective:   Physical Exam  Skin: warm and dry. Pharynx injected. Rapid strep screen negative. TMs are slightly full bilaterally. Neck supple. Chest clear.       Assessment & Plan:  Pharyngitis  Plan: Zithromax Z-PAK take 2 tablets day one followed by 1 tablet days 2 through 5. Tussionex 1 teaspoon by mouth every 12 hours when necessary cough or sore throat pain. Call if not better in 7-10 days or sooner if worse.

## 2014-09-02 ENCOUNTER — Other Ambulatory Visit: Payer: Self-pay | Admitting: Oncology

## 2014-09-02 DIAGNOSIS — Z9889 Other specified postprocedural states: Secondary | ICD-10-CM

## 2014-09-02 DIAGNOSIS — Z853 Personal history of malignant neoplasm of breast: Secondary | ICD-10-CM

## 2014-09-15 ENCOUNTER — Telehealth: Payer: Self-pay | Admitting: Nurse Practitioner

## 2014-09-15 ENCOUNTER — Ambulatory Visit: Payer: BC Managed Care – PPO | Admitting: Nurse Practitioner

## 2014-09-15 NOTE — Telephone Encounter (Signed)
Patient canceled appointment today due to insurance issues.

## 2014-09-18 ENCOUNTER — Other Ambulatory Visit: Payer: Self-pay | Admitting: Obstetrics & Gynecology

## 2014-09-18 DIAGNOSIS — Z9889 Other specified postprocedural states: Secondary | ICD-10-CM

## 2014-09-18 DIAGNOSIS — Z853 Personal history of malignant neoplasm of breast: Secondary | ICD-10-CM

## 2014-09-19 ENCOUNTER — Ambulatory Visit
Admission: RE | Admit: 2014-09-19 | Discharge: 2014-09-19 | Disposition: A | Discharge status: Home / Self Care | Payer: 59 | Source: Ambulatory Visit | Attending: Oncology | Admitting: Oncology

## 2014-09-19 DIAGNOSIS — Z9889 Other specified postprocedural states: Secondary | ICD-10-CM

## 2014-09-19 DIAGNOSIS — Z853 Personal history of malignant neoplasm of breast: Secondary | ICD-10-CM

## 2014-09-29 ENCOUNTER — Other Ambulatory Visit: Payer: 59 | Admitting: Internal Medicine

## 2014-09-29 DIAGNOSIS — Z0000 Encounter for general adult medical examination without abnormal findings: Secondary | ICD-10-CM

## 2014-09-29 DIAGNOSIS — Z13220 Encounter for screening for lipoid disorders: Secondary | ICD-10-CM

## 2014-09-29 DIAGNOSIS — Z130 Encounter for screening for diseases of the blood and blood-forming organs and certain disorders involving the immune mechanism: Secondary | ICD-10-CM

## 2014-09-29 DIAGNOSIS — Z1321 Encounter for screening for nutritional disorder: Secondary | ICD-10-CM

## 2014-09-29 DIAGNOSIS — Z1329 Encounter for screening for other suspected endocrine disorder: Secondary | ICD-10-CM

## 2014-09-29 LAB — CBC WITH DIFFERENTIAL/PLATELET
Basophils Absolute: 0 10*3/uL (ref 0.0–0.1)
Basophils Relative: 0 % (ref 0–1)
EOS ABS: 0.1 10*3/uL (ref 0.0–0.7)
EOS PCT: 2 % (ref 0–5)
HCT: 39.4 % (ref 36.0–46.0)
HEMOGLOBIN: 13.1 g/dL (ref 12.0–15.0)
LYMPHS ABS: 0.7 10*3/uL (ref 0.7–4.0)
Lymphocytes Relative: 19 % (ref 12–46)
MCH: 30.1 pg (ref 26.0–34.0)
MCHC: 33.2 g/dL (ref 30.0–36.0)
MCV: 90.6 fL (ref 78.0–100.0)
MPV: 9.1 fL (ref 8.6–12.4)
Monocytes Absolute: 0.4 10*3/uL (ref 0.1–1.0)
Monocytes Relative: 12 % (ref 3–12)
Neutro Abs: 2.3 10*3/uL (ref 1.7–7.7)
Neutrophils Relative %: 67 % (ref 43–77)
Platelets: 267 10*3/uL (ref 150–400)
RBC: 4.35 MIL/uL (ref 3.87–5.11)
RDW: 13.5 % (ref 11.5–15.5)
WBC: 3.5 10*3/uL — ABNORMAL LOW (ref 4.0–10.5)

## 2014-09-29 LAB — COMPLETE METABOLIC PANEL WITH GFR
ALT: 10 U/L (ref 0–35)
AST: 21 U/L (ref 0–37)
Albumin: 4.1 g/dL (ref 3.5–5.2)
Alkaline Phosphatase: 86 U/L (ref 39–117)
BILIRUBIN TOTAL: 0.4 mg/dL (ref 0.2–1.2)
BUN: 19 mg/dL (ref 6–23)
CALCIUM: 9.2 mg/dL (ref 8.4–10.5)
CO2: 27 mEq/L (ref 19–32)
CREATININE: 0.77 mg/dL (ref 0.50–1.10)
Chloride: 103 mEq/L (ref 96–112)
GFR, EST NON AFRICAN AMERICAN: 82 mL/min
GLUCOSE: 104 mg/dL — AB (ref 70–99)
Potassium: 5.1 mEq/L (ref 3.5–5.3)
Sodium: 139 mEq/L (ref 135–145)
Total Protein: 6.9 g/dL (ref 6.0–8.3)

## 2014-09-29 LAB — LIPID PANEL
CHOLESTEROL: 205 mg/dL — AB (ref 0–200)
HDL: 90 mg/dL (ref >=46)
LDL Cholesterol: 105 mg/dL — ABNORMAL HIGH (ref 0–99)
TRIGLYCERIDES: 52 mg/dL (ref <150)
Total CHOL/HDL Ratio: 2.3 Ratio
VLDL: 10 mg/dL (ref 0–40)

## 2014-09-29 LAB — TSH: TSH: 1.91 u[IU]/mL (ref 0.350–4.500)

## 2014-09-30 LAB — VITAMIN D 25 HYDROXY (VIT D DEFICIENCY, FRACTURES): Vit D, 25-Hydroxy: 45 ng/mL (ref 30–100)

## 2014-10-06 ENCOUNTER — Ambulatory Visit (INDEPENDENT_AMBULATORY_CARE_PROVIDER_SITE_OTHER): Payer: 59 | Admitting: Internal Medicine

## 2014-10-06 ENCOUNTER — Other Ambulatory Visit (HOSPITAL_COMMUNITY)
Admission: RE | Admit: 2014-10-06 | Discharge: 2014-10-06 | Disposition: A | Discharge status: Home / Self Care | Payer: 59 | Source: Ambulatory Visit | Attending: Internal Medicine | Admitting: Internal Medicine

## 2014-10-06 ENCOUNTER — Encounter: Payer: Self-pay | Admitting: Internal Medicine

## 2014-10-06 VITALS — BP 142/82 | HR 84 | Temp 98.0°F | Ht 66.0 in | Wt 123.0 lb

## 2014-10-06 DIAGNOSIS — Z800 Family history of malignant neoplasm of digestive organs: Secondary | ICD-10-CM | POA: Diagnosis not present

## 2014-10-06 DIAGNOSIS — Z01419 Encounter for gynecological examination (general) (routine) without abnormal findings: Secondary | ICD-10-CM | POA: Diagnosis not present

## 2014-10-06 DIAGNOSIS — M8580 Other specified disorders of bone density and structure, unspecified site: Secondary | ICD-10-CM | POA: Diagnosis not present

## 2014-10-06 DIAGNOSIS — Z87891 Personal history of nicotine dependence: Secondary | ICD-10-CM

## 2014-10-06 DIAGNOSIS — B009 Herpesviral infection, unspecified: Secondary | ICD-10-CM | POA: Diagnosis not present

## 2014-10-06 DIAGNOSIS — Z862 Personal history of diseases of the blood and blood-forming organs and certain disorders involving the immune mechanism: Secondary | ICD-10-CM

## 2014-10-06 DIAGNOSIS — Z853 Personal history of malignant neoplasm of breast: Secondary | ICD-10-CM

## 2014-10-06 DIAGNOSIS — Z720 Tobacco use: Secondary | ICD-10-CM

## 2014-10-06 DIAGNOSIS — Z0000 Encounter for general adult medical examination without abnormal findings: Secondary | ICD-10-CM

## 2014-10-06 DIAGNOSIS — R030 Elevated blood-pressure reading, without diagnosis of hypertension: Secondary | ICD-10-CM

## 2014-10-06 DIAGNOSIS — F411 Generalized anxiety disorder: Secondary | ICD-10-CM | POA: Diagnosis not present

## 2014-10-06 DIAGNOSIS — Z8709 Personal history of other diseases of the respiratory system: Secondary | ICD-10-CM | POA: Diagnosis not present

## 2014-10-06 DIAGNOSIS — Z85118 Personal history of other malignant neoplasm of bronchus and lung: Secondary | ICD-10-CM | POA: Diagnosis not present

## 2014-10-06 DIAGNOSIS — IMO0001 Reserved for inherently not codable concepts without codable children: Secondary | ICD-10-CM

## 2014-10-06 MED ORDER — ACYCLOVIR 400 MG PO TABS: 400.0000 mg | ORAL_TABLET | Freq: Two times a day (BID) | ORAL | Status: DC

## 2014-10-06 NOTE — Progress Notes (Signed)
Subjective:    Patient ID: Anna Valentine, female    DOB: 1950-09-25, 64 y.o.   MRN: [id number]  HPI 64 year old Female in today for physical examination. She has a history of breast and lung cancer. Is followed by Dr. Jana Hakim for breast cancer and at [facility] for lung cancer.  In 2006, she was diagnosed with a 1.2 cm poorly differentiated adenocarcinoma of the lung with negative lymph nodes. In February 2011, she was diagnosed with breast cancer and underwent lumpectomy of the left chest with a diagnosis of lobular carcinoma. Sentinel node biopsy showed one out of 4 lymph nodes positive for invasive lobular carcinoma. She was clinical Stage II. Tumor was ER positive, PR positive, Her-2/neu negative. Was placed on a room and x-ray 5 years starting in July 2011. Radiation therapy completed July 2011.  History of osteopenia. 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 History of HSV on Botox. History of anal fissure 1996. History of right lower lobe pneumonia December 2007.  Bilateral tubal ligation 1984, left bunionectomy 1990, right bunionectomy 1992. Right lower lobectomy at [facility] November 2006. Pneumovax immunization 2008. Influenza immunization October 2011. Tetanus immunization 2004. Last colonoscopy 2012. GYN is [facility], nurse practitioner but patient wants to have Pap smear done here because of insurance reasons. Patient has taken Actonel Fosamax and Evista in the past but has discontinued all those.  Biaxin causes hives.  Social history: She is married. No children. She smoked 1-1/2-2 packs of cigarettes daily for some 15 years but quit around 1991. No alcohol consumption. Completed 4 years of college. 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 His health is not good. One brother in good health. One brother died of prostate cancer. Father died of colon cancer at age 64. Mother died of metastatic lung cancer at age 66. There is a  family history of breast cancer in her mother and sister. Interestingly, her mother had double mastectomies in her 15s but in December 1992 was found to have lung cancer presumably a new cancer not related to breast cancer. Mother developed metastatic disease in brain and passed away. She has one brother and one sister who have hypertension. Mother also had hypertension.        Review of Systems  Constitutional: Negative.   HENT: Negative.   Eyes: Negative.   Respiratory: Negative.   Cardiovascular: Negative.        Has noted elevated blood pressure at home at times  Gastrointestinal: Negative.   Endocrine: Negative.   Genitourinary:       Vaginal dryness. Not sexually active.  Neurological: Negative.   Hematological: Negative.   Psychiatric/Behavioral:       Anxiety       Objective:   Physical Exam  Constitutional: She is oriented to person, place, and time. She appears well-developed and well-nourished. No distress.  HENT:  Head: Normocephalic and atraumatic.  Right Ear: External ear normal.  Left Ear: External ear normal.  Mouth/Throat: Oropharynx is clear and moist. No oropharyngeal exudate.  Eyes: Conjunctivae and EOM are normal. Pupils are equal, round, and reactive to light. Right eye exhibits no discharge. Left eye exhibits no discharge. No scleral icterus.  Neck: Neck supple. No JVD present. No tracheal deviation present.  Cardiovascular: Normal rate, regular rhythm, normal heart sounds and intact distal pulses.   No murmur heard. Pulmonary/Chest: Effort normal and breath sounds normal. No respiratory distress. She has no wheezes. She has no rales.  Breast normal female without masses  Abdominal: Soft. Bowel sounds are normal. She exhibits no distension and no mass. There is no tenderness. There is no rebound and no guarding.  Genitourinary:  Pap attempted. She has a lot of vaginal atrophy and discomfort. Last Pap 2013 by Helene Shoe  Musculoskeletal: Normal range  of motion. She exhibits no edema.  Lymphadenopathy:    She has no cervical adenopathy.  Neurological: She is alert and oriented to person, place, and time. She has normal reflexes. She displays normal reflexes. No cranial nerve deficit. Coordination normal.  Skin: Skin is warm and dry. No rash noted. She is not diaphoretic.  Psychiatric: She has a normal mood and affect. Her behavior is normal. Judgment and thought content normal.  Vitals reviewed.         Assessment & Plan:  History of breast cancer-now on Arimidex. Followed by oncologist. Says she will discontinue Arimidex in May. Was told recently she has small calcification on mammogram and study would be repeated in 6 months. She is concerned about this, but radiologist told her calcification was too small to biopsy. I think six-month follow-up is reasonable. Stage II left breast invasive lobular adenocarcinoma  History of lung cancer-followed at [place] lower lobe lobectomy 2006 secondary to 1.2 cm poorly differentiated adenocarcinoma T1 N0  Elevated blood pressure- to monitor- f/u 4 weeks  Osteoporosis-to get bone density study in the near future. Order has been placed.  History of anxiety related to her health due to strong family history of cancer

## 2014-10-06 NOTE — Patient Instructions (Signed)
Continue same medications. Return in 4 weeks for office visit with nurse and blood pressure check. Have bone density study.

## 2014-10-09 ENCOUNTER — Telehealth: Payer: Self-pay | Admitting: *Deleted

## 2014-10-09 LAB — CYTOLOGY - PAP

## 2014-10-09 NOTE — Telephone Encounter (Signed)
Left message with PAP results

## 2014-11-03 ENCOUNTER — Encounter: Payer: Self-pay | Admitting: Internal Medicine

## 2014-11-03 ENCOUNTER — Ambulatory Visit (INDEPENDENT_AMBULATORY_CARE_PROVIDER_SITE_OTHER): Payer: 59 | Admitting: Internal Medicine

## 2014-11-03 VITALS — BP 126/78 | HR 93 | Temp 98.2°F | Wt 121.0 lb

## 2014-11-03 DIAGNOSIS — Z658 Other specified problems related to psychosocial circumstances: Secondary | ICD-10-CM | POA: Diagnosis not present

## 2014-11-03 DIAGNOSIS — I10 Essential (primary) hypertension: Secondary | ICD-10-CM

## 2014-11-03 DIAGNOSIS — F439 Reaction to severe stress, unspecified: Secondary | ICD-10-CM

## 2014-11-03 DIAGNOSIS — IMO0001 Reserved for inherently not codable concepts without codable children: Secondary | ICD-10-CM

## 2014-11-03 DIAGNOSIS — R030 Elevated blood-pressure reading, without diagnosis of hypertension: Secondary | ICD-10-CM | POA: Diagnosis not present

## 2014-11-03 DIAGNOSIS — R0989 Other specified symptoms and signs involving the circulatory and respiratory systems: Secondary | ICD-10-CM

## 2014-11-03 MED ORDER — ALPRAZOLAM 0.25 MG PO TABS: 0.2500 mg | ORAL_TABLET | Freq: Two times a day (BID) | ORAL | Status: DC | PRN

## 2014-11-03 NOTE — Progress Notes (Signed)
   Subjective:    Patient ID: Anna Valentine, female    DOB: [birth year], 64 y.o.   MRN: [id number]  HPI Here today to follow-up on elevated blood pressure. Husband has been diagnosed with myelodysplasia but no treatment is required at the present time. He's also having some memory issues and she is concerned about that. He is to see a neurologist in the near future. She brings in multiple blood pressure readings over the past 3 weeks or so. In the mornings she eats breakfast goes out and walks the dog and comes back takes her blood pressure. She surprised that it's elevated but explained her that was after exercise. Highest reading has been 110 systolically. Lowest reading in the mornings 315 systolically.  In the afternoons blood pressure seems to be better. Readings seem to run from 945-859 systolically. Diastolics are within normal limits.    Review of Systems     Objective:   Physical Exam  Not examined. Spent 15 minutes speaking with patient about these issues and situational stress with her husband. I think she has an element of labile hypertension that is reflected here in the office today.      Assessment & Plan:  Labile hypertension  Office hypertension  Plan: Gave her a small quantity of Xanax. She should take one half to one tablet one hour before coming to office or going to other medical offices. She has a history of breast cancer. Expects to come off of Arimidex soon. continue to follow blood pressures at home and call if persistently elevated.

## 2014-11-03 NOTE — Patient Instructions (Signed)
Takes Xanax an hour before coming to medical office for blood pressure check

## 2014-11-07 ENCOUNTER — Telehealth: Payer: Self-pay

## 2014-11-07 NOTE — Telephone Encounter (Signed)
Pt had labs drawn on 3/21 with PCP. Her appt 5/10 is a 6 month f/u appt. Pt feels the labs on 3/21 are current enough and will not come for lab appt scheduled 5/3. Forwarding to Dr Jana Hakim for [employer].

## 2014-11-11 ENCOUNTER — Other Ambulatory Visit: Payer: BC Managed Care – PPO

## 2014-11-18 ENCOUNTER — Ambulatory Visit (HOSPITAL_BASED_OUTPATIENT_CLINIC_OR_DEPARTMENT_OTHER): Payer: 59 | Admitting: Oncology

## 2014-11-18 ENCOUNTER — Telehealth: Payer: Self-pay | Admitting: Oncology

## 2014-11-18 VITALS — BP 158/81 | HR 125 | Temp 98.0°F | Resp 18 | Ht 66.0 in | Wt 122.5 lb

## 2014-11-18 DIAGNOSIS — N649 Disorder of breast, unspecified: Secondary | ICD-10-CM | POA: Diagnosis not present

## 2014-11-18 DIAGNOSIS — Z803 Family history of malignant neoplasm of breast: Secondary | ICD-10-CM | POA: Diagnosis not present

## 2014-11-18 DIAGNOSIS — M810 Age-related osteoporosis without current pathological fracture: Secondary | ICD-10-CM | POA: Diagnosis not present

## 2014-11-18 DIAGNOSIS — C50812 Malignant neoplasm of overlapping sites of left female breast: Secondary | ICD-10-CM

## 2014-11-18 DIAGNOSIS — C50212 Malignant neoplasm of upper-inner quadrant of left female breast: Secondary | ICD-10-CM

## 2014-11-18 NOTE — Telephone Encounter (Signed)
Gave patient avs report and appointment for October 2016. Message to [facility] for patient to be seen in by [facility] October 2017.

## 2014-11-18 NOTE — Progress Notes (Signed)
[place]  [phone] [phone]) [phone]     ID: Anna Valentine DOB: 1951/03/25  MR#: [id number]  NKN#:[id number]  Patient Care Team: [name], MD as PCP - General (Internal Medicine) PCP: [name], MD GYN: Silvestre Moment SU: Osborn Coho MD] [name] MD OTHER MD: [name] M.D.  CHIEF COMPLAINT: (1) Estrogen receptor positive breast cancer   (2) history of stage IA right lung adenocarcinoma resected November 2006   CURRENT TREATMENT: Anastrozole, to be completed July 2016   BREAST CANCER HISTORY:  From Dr. [name] original intake note 07/26/2005: (Lung cancer presentation)  "The patient is a very pleasant 64 year old female without a significant past medical history, who states that in 04/2005 she felt a lump in the left neck.  It did not resolve spontaneously, so she was seen by Dr. [name] [facility], and a CT scan of the neck was performed on 04/25/05.  There was noted to be two lymph nodes on the left, and they were normal in size, but they were asymmetrical.  No other pathologic findings were noted, except for left internal jugular vein, which was atypically positioned.  However, because of these enlarged lymph nodes, a CT scan of the chest was also obtained by Dr. Renold Genta on 04/27/05, and the CT scan did reveal a poorly defined nodular opacity medially in the right upper lobe, measuring 6.5 mg anterior to posterior.  Within the right lower lobe there was noted to be a lobular nodular mass measuring 13 x 13 mm, and was felt to be worrisome for a primary lung carcinoma.  No other nodules or effusions were seen on the left lung.  On the mediastinal window images there was slight prominence of right hilar nodes, but no other evidence of mediastinal or hilar adenopathy.  A PET scan was thereafter obtained on 05/06/05.  The PET scan did reveal increased FDG activity within a small mass in the posterior superior right lower lobe, highly  suspicious for a malignancy.  No significant nodal FDG uptake was identified within the hilar regions or the mediastinum.  Also noted was abnormal FDG uptake in the right middle lobe, corresponding to the CT scan findings, and again suspicious for a malignancy.  There was no evidence of metastatic disease within the neck, abdomen or pelvis.  The patient was then referred to [facility], and was seen by Dr. [name].  The patient underwent a right lower lobe wedge resection with completion lobectomy and mediastinal lymph node biopsies.  The pathology revealed the following:  Right upper lobe wedge resection revealed pulmonary tissue with necrotizing granulomatous inflammation.  No evidence of malignancy.  Right middle lobe wedge revealed again pulmonary tissue with necrotizing granulomatous inflammation.  No evidence of malignancy.  The right pleural lobe (lobectomy) revealed an adenocarcinoma, poorly differentiated, grade 3, measuring 1.2 cm.  The visceral pleura was negative.  Chest wall negative.  Mediastinum negative.  All margins were negative, including the pleural and parenchymal margins.  There was no evidence of lymphovascular invasion.  In all lymph nodes sampled, including level 11, level 7, level 12, 4R and 2R lymph nodes negative for malignancy.  The patient was staged as T1 N0 M0, stage IA adenocarcinoma of the right lung.  Postoperatively the patient's course was complicated by the development of pleural effusion, requiring diuresis.  She had multiple x-rays performed.  Last x-ray performed on 07/07/05 revealed persistent right pleural effusion.  It was recommended that the patient have followups  at [facility].  Dr. [name] Baxley kindly refers the patient to me today for medical oncology evaluation.  Clinically, the patient states that she has recovered well.  She is once again walking.  She was a runner, and she is trying to get back in shape."  From [name]'s 09/30/2009 note: (Breast  cancer presentation):  "She tells me that most recently she had her yearly screening mammogram performed that revealed an abnormality.  She also on exam was noted to have a palpable lump in the upper left breast as well.  Because of the abnormality, patient on September 02, 2009 had a digital diagnostic mammogram of the left breast and ultrasound of the left breast.  The mammogram revealed a 7.0 mm area slightly increased density in the upper left breast.  There were no suspicious calcifications noted.  The breast was heterogeneously dense.  Patient then went onto have an ultrasound of the breast performed and again a 7.0 mm irregular hypoechoic shadowing mass was noted in the 12 o'clock position of the left breast 5.0 cm from the nipple.  There were no enlarged or abnormal left axillary lymph nodes identified.  Patient went onto have a core biopsy performed on 09/02/2009 [phone]).  The needle core biopsy of the 12 o'clock mass revealed an invasive mammary carcinoma, low to intermediate grade.  It was lobular carcinoma.  Confirmatory immunohistochemical stains revealed the tumor to be strongly positive for cytokeratin AE1-AE3 with an infiltrative pattern of growth and the tumor was negative for E-cadherin stain confirming a lobular phenotype.  The tumor was estrogen receptor positive at 98%, progesterone receptor positive 6%, proliferation marker Ki-67 by MIB was 14%.  The tumor did not express HER-2/neu by CISH with a signal of 1.04.  Patient was seen by Dr. Neldon Mc on 09/08/2009 for discussion of surgical options.  His recommendation was a lumpectomy.  Patient also had bilateral MRI of the breasts performed, which revealed a 0.8 cm mildly enlarged area of mass-like enhancement at the 12 o'clock location corresponding to the biopsy-proven breast cancer.  There was no evidence of lymphadenopathy."  Her subsequent history is detailed below  INTERVAL HISTORY: Anna Valentine returns today for follow-up of  her breast cancer. She and her husband are in the process of retiring. This is a bit stressful, but she is already beginning to develop new activities including baby sitting for a neighbor. She is "loping", not running, and more walking than loping but she is planning to "speeded up a bit" now that she will have more time for herself. She continues on anastrozole, with no significant side effects. She also obtains it at a good price.  REVIEW OF SYSTEMS: Asked what her "worst problem" this, she has no worse problem and in fact a detailed review of systems today was noncontributory except as noted  PAST MEDICAL HISTORY: Past Medical History  Diagnosis Date  . Osteoporosis   . STD (sexually transmitted disease)     HSV  . GERD (gastroesophageal reflux disease)   . Breast cancer 08/2009    stage 2, rx with lumpectomy and xrt  . Lung cancer 06/06/05    stage 1 poorly differentiated adenocarcinoma, s/p right lower lobectomy.  Marland Kitchen BRCA negative 2011    BRCA I/ II negative    PAST SURGICAL HISTORY: Past Surgical History  Procedure Laterality Date  . Lobectomy  06/06/2005    Right  . Tonsillectomy    . Tubal ligation  1984  . Bunionectomy    .  Breast lumpectomy  10/2009    Left lumpectomy and radiation, stage II, ER/PR+, Her 2 nu negative    FAMILY HISTORY Family History  Problem Relation Age of Onset  . Allergies Mother   . Asthma Mother   . Breast cancer Mother   . Lung cancer Mother   . Breast cancer Mother     breast  . Colon cancer Father   . Colon cancer Father     colon  . Prostate cancer Brother   . Prostate cancer Brother     prostate  . Breast cancer Sister   . Breast cancer Sister     breast diagnosed at 81 with recurrence age 37 neg.  BRCA  . Breast cancer Maternal Grandmother   . Breast cancer Maternal Grandmother     breast    GYNECOLOGIC HISTORY:  No LMP recorded. Patient is postmenopausal. Menarche age 82, the patient is GX P0. She went through the change  of life in approximately age 63. She did not take hormone replacement.  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 It's just the 2 of them at home. The patient is a Tourist information centre manager    ADVANCED DIRECTIVES: In place   HEALTH MAINTENANCE: History  Substance Use Topics  . Smoking status: Former Smoker -- 2.00 packs/day for 18 years    Quit date: 07/12/1987  . Smokeless tobacco: Never Used  . Alcohol Use: 2.0 oz/week    4 drink(s) per week    Allergies  Allergen Reactions  . Clarithromycin     REACTION: rash    Current Outpatient Prescriptions  Medication Sig Dispense Refill  . acyclovir (ZOVIRAX) 400 MG tablet Take 1 tablet (400 mg total) by mouth 2 (two) times daily. 90 tablet 3  . albuterol (PROAIR HFA) 108 (90 BASE) MCG/ACT inhaler     . ALPRAZolam (XANAX) 0.25 MG tablet Take 1 tablet (0.25 mg total) by mouth 2 (two) times daily as needed for anxiety. 30 tablet 0  . anastrozole (ARIMIDEX) 1 MG tablet Take 1 tablet (1 mg total) by mouth daily. 30 tablet 11  . aspirin 81 MG tablet Take 81 mg by mouth daily.    . Calcium Carbonate (CALCIUM 600 PO) Take 600 mg by mouth daily.      . cholecalciferol (VITAMIN D) 1000 UNITS tablet Take 1,000 Units by mouth daily.    . cyclobenzaprine (FLEXERIL) 10 MG tablet Take 1 tablet (10 mg total) by mouth at bedtime. (Patient not taking: Reported on 10/06/2014) 30 tablet 0  . omeprazole (PRILOSEC) 40 MG capsule     . Psyllium (METAMUCIL PO) Take by mouth daily.       No current facility-administered medications for this visit.    OBJECTIVE: Middle-aged white woman in no acute distress Filed Vitals:   11/18/14 1518  BP: 158/81  Pulse: 125  Temp: 98 F (36.7 C)  Resp: 18     Body mass index is 19.78 kg/(m^2).    ECOG FS:0 - Asymptomatic  Sclerae unicteric, pupils round and equal Oropharynx clear, dentition in good repair No cervical or supraclavicular adenopathy Lungs no rales or  rhonchi, good excursion bilaterally Heart regular rate and rhythm Abd soft, nontender, positive bowel sounds MSK no focal spinal tenderness, no upper extremity lymphedema Neuro: nonfocal, well oriented, positive affect Breasts: I do not palpate any suspicious masses in the right breast. The right axilla is benign. I do not palpate any suspicious masses in  the left breast, which is status post lumpectomy and radiation. There is no evidence of local recurrence. The left axilla is benign    LAB RESULTS:  CMP     Component Value Date/Time   NA 139 09/29/2014 0923   NA 138 06/16/2014 1100   NA 137 03/23/2010 1019   K 5.1 09/29/2014 0923   K 4.3 06/16/2014 1100   K 4.6 03/23/2010 1019   CL 103 09/29/2014 0923   CL 103 11/22/2012 1025   CL 99 03/23/2010 1019   CO2 27 09/29/2014 0923   CO2 27 06/16/2014 1100   CO2 29 03/23/2010 1019   GLUCOSE 104* 09/29/2014 0923   GLUCOSE 86 06/16/2014 1100   GLUCOSE 94 11/22/2012 1025   GLUCOSE 95 03/23/2010 1019   BUN 19 09/29/2014 0923   BUN 14.0 06/16/2014 1100   BUN 16 03/23/2010 1019   CREATININE 0.77 09/29/2014 0923   CREATININE 0.7 06/16/2014 1100   CREATININE 0.70 11/14/2011 1031   CALCIUM 9.2 09/29/2014 0923   CALCIUM 9.3 06/16/2014 1100   CALCIUM 9.7 03/23/2010 1019   PROT 6.9 09/29/2014 0923   PROT 7.0 06/16/2014 1100   PROT 6.9 03/23/2010 1019   ALBUMIN 4.1 09/29/2014 0923   ALBUMIN 3.4* 06/16/2014 1100   AST 21 09/29/2014 0923   AST 22 06/16/2014 1100   AST 33 03/23/2010 1019   ALT 10 09/29/2014 0923   ALT 12 06/16/2014 1100   ALT 26 03/23/2010 1019   ALKPHOS 86 09/29/2014 0923   ALKPHOS 77 06/16/2014 1100   ALKPHOS 72 03/23/2010 1019   BILITOT 0.4 09/29/2014 0923   BILITOT 0.27 06/16/2014 1100   BILITOT 0.60 03/23/2010 1019   GFRNONAA 82 09/29/2014 0923   GFRNONAA >60 10/07/2009 1435   GFRAA >89 09/29/2014 0923   GFRAA  10/07/2009 1435    >60        The eGFR has been calculated using the MDRD equation. This  calculation has not been validated in all clinical situations. eGFR's persistently <60 mL/min signify possible Chronic Kidney Disease.    INo results found for: SPEP, UPEP  Lab Results  Component Value Date   WBC 3.5* 09/29/2014   NEUTROABS 2.3 09/29/2014   HGB 13.1 09/29/2014   HCT 39.4 09/29/2014   MCV 90.6 09/29/2014   PLT 267 09/29/2014      Chemistry      Component Value Date/Time   NA 139 09/29/2014 0923   NA 138 06/16/2014 1100   NA 137 03/23/2010 1019   K 5.1 09/29/2014 0923   K 4.3 06/16/2014 1100   K 4.6 03/23/2010 1019   CL 103 09/29/2014 0923   CL 103 11/22/2012 1025   CL 99 03/23/2010 1019   CO2 27 09/29/2014 0923   CO2 27 06/16/2014 1100   CO2 29 03/23/2010 1019   BUN 19 09/29/2014 0923   BUN 14.0 06/16/2014 1100   BUN 16 03/23/2010 1019   CREATININE 0.77 09/29/2014 0923   CREATININE 0.7 06/16/2014 1100   CREATININE 0.70 11/14/2011 1031      Component Value Date/Time   CALCIUM 9.2 09/29/2014 0923   CALCIUM 9.3 06/16/2014 1100   CALCIUM 9.7 03/23/2010 1019   ALKPHOS 86 09/29/2014 0923   ALKPHOS 77 06/16/2014 1100   ALKPHOS 72 03/23/2010 1019   AST 21 09/29/2014 0923   AST 22 06/16/2014 1100   AST 33 03/23/2010 1019   ALT 10 09/29/2014 0923   ALT 12 06/16/2014 1100   ALT 26 03/23/2010 1019  BILITOT 0.4 09/29/2014 0923   BILITOT 0.27 06/16/2014 1100   BILITOT 0.60 03/23/2010 1019       Lab Results  Component Value Date   LABCA2 18 09/30/2009    No components found for: [id number]  No results for input(s): INR in the last 168 hours.  Urinalysis    Component Value Date/Time   BILIRUBINUR neg 09/12/2013 1012   PROTEINUR neg 09/12/2013 1012   UROBILINOGEN negative 09/12/2013 1012   NITRITE neg 09/12/2013 1012   LEUKOCYTESUR Negative 09/12/2013 1012    STUDIES: CLINICAL DATA: 64 year old female with history of left breast cancer post lumpectomy in 2011 followed by radiation therapy.  EXAM: DIGITAL DIAGNOSTIC BILATERAL  MAMMOGRAM WITH 3D TOMOSYNTHESIS AND CAD  COMPARISON: Previous exams.  ACR Breast Density Category c: The breast tissue is heterogeneously dense, which may obscure small masses.  FINDINGS: No suspicious masses or calcifications are seen in the left breast. Postsurgical changes are present in the superior posterior left breast related to prior lumpectomy. A spot compression magnification view of the lumpectomy site in the left breast was performed with no mammographic evidence of locally recurrent malignancy. Calcifications are seen in the upper-outer right breast on the initial tomosynthesis images which appear coarse and likely early dystrophic on the additional spot compression magnification views.  Mammographic images were processed with CAD.  IMPRESSION: Probably benign right breast calcifications.  RECOMMENDATION: Diagnostic mammography with magnification views of the right breast in 6 months to demonstrate stability of the probably benign right breast calcifications.  I have discussed the findings and recommendations with the patient. Results were also provided in writing at the conclusion of the visit. If applicable, a reminder letter will be sent to the patient regarding the next appointment.  BI-RADS CATEGORY 3: Probably benign.   Electronically Signed  By: Everlean Alstrom M.D.  On: 09/19/2014 12:34    CT Chest without contrast  Indication: 162.9 Malignant neoplasm of bronchus and lung, unspecified site, fu  Compare: 07/24/2012  Technique:  Volumetric non-contrast chest CT acquisition was performed from the lower neck to the adrenal glands.  1.25 mm axial, 5 mm axial, 3 mm coronal, and 3 mm sagittal reconstructions were performed. 3D axial maximum intensity projection images (MIPS) were reconstructed to facilitate lung nodule detection.  Findings: Visualized thyroid glands within normal limits.  There is no axillary lymphadenopathy.  The  thoracic aorta is normal size. Scattered coronary calcifications and no pericardial effusion.  There is a small amount of right-sided pleural fluid that is stable. Bones are within normal limits. Status post right lower lobe resection with stable areas of architectural distortion and bronchial wall thickening and bronchiectasis as well as some scattered nodules and some associated paraseptal emphysema. Stable hyperinflation of the left lung.  Impression: 1. Overall no significant interval change  Electronically Signed by:  Tereasa Coop, MD Electronically Signed on:  01/21/2014 10:40 AM    ASSESSMENT: 64 y.o. [place], [place] woman status post right upper lobe wedge resection, middle lobe wedge resection, lower lobectomy and mediastinal lymph node dissection 06/06/2005 for a 1.2 cm grade 3 adenocarcinoma, pT1 pN1, stage Ia  (a) followed at [facility] with every other year chest CT, next due July 2017  (1) status post left breast upper outer quadrant biopsy 09/02/2009 for an invasive lobular carcinoma (E-cadherin negative) estrogen receptor 98% positive, progesterone receptor 6% positive, with an MIB-1 of 14% and no HER-2 amplification. [[id number]]  (2) status post left lumpectomy and sentinel lymph node sampling 10/12/2009 for a pT1b pN1,  stage IIA invasive lobular breast cancer, with negative margins.  (3) Oncotype DX score of 16 predicts a risk of outside the breast recurrence of 10% if the patient's only systemic treatment is tamoxifen for 5 years. It also predicts no benefit from adjuvant chemotherapy  (4) completed adjuvant radiation therapy 01/28/2010, receiving 5040 cGy to the left breast, with a boost to the upper inner aspect of the breast (to a cumulative dose of 6300 cGy); the axillary and supraclavicular regions received 4500 cGy  (5) started anastrozole July 2011, completes 5 years July 2016  (6) bone density 02/21/2012 at [facility] showed osteoporosis with a T score of -2.5; on  alendronate  PLAN: Elzie is doing fine from a breast cancer point of view. We discussed the small area of calcificationin the contralateral, right breast, which are felt most likely to be dystrophic (benign). Close follow-up is appropriate and she will have the next right mammogram in September. Of course this is frightening to her because both her mother and sister had recurrence in the contralateral breast right at the 5 year mark from the original breast cancer diagnosis. Perhaps for that reason she would like to continue to be followed here and today we discussed the survivorship clinic we are developing. She is very interested in that program.  Accordingly she will see me this October if there is an abnormality noted on the mammography in September. Otherwise she will cancel the October 2016 visit and, instead of October 2017 (she usually sees Dr. Renold Genta in March and will see Dr. Otilio Connors at [facility] July 2017. Accordingly a visit here and October spaces are physician's optimally).  Tequilla has a good understanding of this plan. She agrees with it. She will call with any problems that may develop before the next visit here The patient has a good understanding of the overall plan. She agrees with it. She knows the goal of treatment in her case is cure. She will call with any problems that may develop before her next visit here.  Chauncey Cruel, MD   11/18/2014 3:40 PM

## 2015-01-29 ENCOUNTER — Encounter: Payer: Self-pay | Admitting: Internal Medicine

## 2015-01-29 ENCOUNTER — Ambulatory Visit (INDEPENDENT_AMBULATORY_CARE_PROVIDER_SITE_OTHER): Payer: 59 | Admitting: Internal Medicine

## 2015-01-29 VITALS — BP 150/98 | HR 91 | Temp 98.4°F | Wt 120.0 lb

## 2015-01-29 DIAGNOSIS — F411 Generalized anxiety disorder: Secondary | ICD-10-CM

## 2015-01-29 DIAGNOSIS — H6502 Acute serous otitis media, left ear: Secondary | ICD-10-CM

## 2015-01-29 DIAGNOSIS — IMO0001 Reserved for inherently not codable concepts without codable children: Secondary | ICD-10-CM

## 2015-01-29 DIAGNOSIS — H8112 Benign paroxysmal vertigo, left ear: Secondary | ICD-10-CM

## 2015-01-29 DIAGNOSIS — R030 Elevated blood-pressure reading, without diagnosis of hypertension: Secondary | ICD-10-CM

## 2015-01-29 MED ORDER — AZITHROMYCIN 250 MG PO TABS: ORAL_TABLET | ORAL | Status: DC

## 2015-01-29 MED ORDER — PROMETHAZINE HCL 25 MG/ML IJ SOLN
25.0000 mg | Freq: Once | INTRAMUSCULAR | Status: AC
  Administered 2015-01-29: 25 mg via INTRAMUSCULAR

## 2015-01-29 NOTE — Patient Instructions (Addendum)
Take Zithromax Z-PAK as directed. Phenergan 25 mg IM given in office. Tomorrow if vertigo is persistent try Xanax instead of Bonine.

## 2015-01-29 NOTE — Progress Notes (Signed)
   Subjective:    Patient ID: Anna Valentine, female    DOB: October 01, 1950, 64 y.o.   MRN: [id number]  HPI  Onset July 18 of vertigo. Seem to be positional in nature. No recent URI. Seems to be worse with turning her head to the left. She looked on YouTube  and  tried to perform an Epley maneuver but she was so dizzy that she cannot really do that. Unable to drive due to dizziness. This has her frightened. History of anxiety. No headache. No fever or shaking chills.  No sore throat.    Review of Systems     Objective:   Physical Exam PERRLA. Funduscopic exam is benign. Left TM is full but not red Pharynx is clear. Neck is supple without adenopathy. Chest clear to auscultation. Cardiac exam regular rate and rhythm normal S1 and S2. Cranial nerves II through XII grossly intact. Gait is normal. Muscle strength in the upper and lower extremities normal. Deep tendon reflexes normal. No nystagmus demonstrated today       Assessment & Plan:  Benign positional vertigo  Acute left serous otitis media  Anxiety  Plan: May try Bonine for vertigo symptoms. Phenergan 25 mg IM given in office. Zithromax Z-PAK take as directed for ear infection. If not better tomorrow, try Xanax instead of Bonine for vertigo. Patient reassured.

## 2015-02-13 ENCOUNTER — Other Ambulatory Visit: Payer: Self-pay | Admitting: Oncology

## 2015-02-13 ENCOUNTER — Telehealth: Payer: Self-pay | Admitting: Internal Medicine

## 2015-02-13 DIAGNOSIS — H8110 Benign paroxysmal vertigo, unspecified ear: Secondary | ICD-10-CM

## 2015-02-13 DIAGNOSIS — R921 Mammographic calcification found on diagnostic imaging of breast: Secondary | ICD-10-CM

## 2015-02-13 NOTE — Telephone Encounter (Signed)
Patient has seen Dr Erik Obey in the past I will put in referral for her to see him

## 2015-02-13 NOTE — Telephone Encounter (Signed)
This can take some time to resolve. Send to ENT

## 2015-02-13 NOTE — Telephone Encounter (Signed)
She is still having particular times of vertigo.  When she gets up out of bed, the room is moving in circles.  She is trying to get up very slowly.  It seemed to be the left side before.  She is now seemingly having trouble with both sides as well as when looking up; states really not predictable.  Wants to know if you want to see her again or what you feel is best to do at this point?    Please advise.

## 2015-02-13 NOTE — Telephone Encounter (Signed)
Referral placed for Dr Erik Obey patient notified

## 2015-03-25 ENCOUNTER — Ambulatory Visit
Admission: RE | Admit: 2015-03-25 | Discharge: 2015-03-25 | Disposition: A | Discharge status: Home / Self Care | Payer: 59 | Source: Ambulatory Visit | Attending: Oncology | Admitting: Oncology

## 2015-03-25 DIAGNOSIS — R921 Mammographic calcification found on diagnostic imaging of breast: Secondary | ICD-10-CM

## 2015-03-26 ENCOUNTER — Telehealth: Payer: Self-pay | Admitting: Oncology

## 2015-03-26 NOTE — Telephone Encounter (Signed)
Returned Advertising account executive. Patient moved appointment from November to May, patient request due to [facility] being good. Patient confirmed appointment for 11/23/15.

## 2015-05-06 ENCOUNTER — Ambulatory Visit (INDEPENDENT_AMBULATORY_CARE_PROVIDER_SITE_OTHER): Payer: 59 | Admitting: Internal Medicine

## 2015-05-06 VITALS — BP 160/84 | HR 92

## 2015-05-06 DIAGNOSIS — Z23 Encounter for immunization: Secondary | ICD-10-CM | POA: Diagnosis not present

## 2015-05-14 ENCOUNTER — Ambulatory Visit: Payer: 59 | Admitting: Oncology

## 2015-08-06 ENCOUNTER — Other Ambulatory Visit: Payer: Self-pay | Admitting: Internal Medicine

## 2015-08-14 ENCOUNTER — Encounter: Payer: Self-pay | Admitting: Internal Medicine

## 2015-08-19 ENCOUNTER — Other Ambulatory Visit: Payer: Self-pay | Admitting: Oncology

## 2015-08-19 DIAGNOSIS — R921 Mammographic calcification found on diagnostic imaging of breast: Secondary | ICD-10-CM

## 2015-08-19 DIAGNOSIS — Z853 Personal history of malignant neoplasm of breast: Secondary | ICD-10-CM

## 2015-09-02 ENCOUNTER — Encounter: Payer: Self-pay | Admitting: Gastroenterology

## 2015-09-09 ENCOUNTER — Telehealth: Payer: Self-pay | Admitting: *Deleted

## 2015-09-09 NOTE — Telephone Encounter (Signed)
Patient completed recommended breast imaging due 03/2015  Recommendation:  Follow-up right breast diagnostic mammogram, with magnification views, in 6 months.  This may be performed in conjunction with patient's routine left breast screening mammogram schedule which would also be in 6 months.  Last MMG and Results:  Probably benign coarse calcifications within the upper right breast, increasingly coarse since the previous exam, for which an additional follow-up right breast diagnostic mammogram in 6 months is recommended to ensure stability.  Patient completed recommended breast imaging on 03/25/15.  6 month follow up, bilateral diagnostic scheduled for 09/23/15.  OK to remove from current recall?

## 2015-09-11 NOTE — Telephone Encounter (Signed)
Yes, out of recall.

## 2015-09-23 ENCOUNTER — Ambulatory Visit
Admission: RE | Admit: 2015-09-23 | Discharge: 2015-09-23 | Disposition: A | Discharge status: Home / Self Care | Payer: BLUE CROSS/BLUE SHIELD | Source: Ambulatory Visit | Attending: Oncology | Admitting: Oncology

## 2015-09-23 DIAGNOSIS — Z853 Personal history of malignant neoplasm of breast: Secondary | ICD-10-CM

## 2015-09-23 DIAGNOSIS — R921 Mammographic calcification found on diagnostic imaging of breast: Secondary | ICD-10-CM

## 2015-10-05 NOTE — Telephone Encounter (Signed)
Recall completed.  Closing encounter.

## 2015-10-15 ENCOUNTER — Other Ambulatory Visit: Payer: Self-pay | Admitting: Internal Medicine

## 2015-11-12 ENCOUNTER — Telehealth: Payer: Self-pay | Admitting: Oncology

## 2015-11-12 NOTE — Telephone Encounter (Signed)
PAL - moved lab/fu from 5/15 to 6/22. Left message for patient and mailed scheduled.

## 2015-11-23 ENCOUNTER — Other Ambulatory Visit: Payer: 59

## 2015-11-23 ENCOUNTER — Ambulatory Visit: Payer: 59 | Admitting: Oncology

## 2015-12-30 ENCOUNTER — Other Ambulatory Visit: Payer: Self-pay | Admitting: *Deleted

## 2015-12-30 DIAGNOSIS — C50212 Malignant neoplasm of upper-inner quadrant of left female breast: Secondary | ICD-10-CM

## 2015-12-31 ENCOUNTER — Telehealth: Payer: Self-pay | Admitting: Oncology

## 2015-12-31 ENCOUNTER — Ambulatory Visit (HOSPITAL_BASED_OUTPATIENT_CLINIC_OR_DEPARTMENT_OTHER): Payer: BLUE CROSS/BLUE SHIELD | Admitting: Oncology

## 2015-12-31 ENCOUNTER — Other Ambulatory Visit (HOSPITAL_BASED_OUTPATIENT_CLINIC_OR_DEPARTMENT_OTHER): Payer: BLUE CROSS/BLUE SHIELD

## 2015-12-31 VITALS — BP 160/83 | HR 75 | Temp 98.1°F | Resp 18 | Ht 66.0 in | Wt 118.4 lb

## 2015-12-31 DIAGNOSIS — Z853 Personal history of malignant neoplasm of breast: Secondary | ICD-10-CM

## 2015-12-31 DIAGNOSIS — M810 Age-related osteoporosis without current pathological fracture: Secondary | ICD-10-CM

## 2015-12-31 DIAGNOSIS — C50212 Malignant neoplasm of upper-inner quadrant of left female breast: Secondary | ICD-10-CM

## 2015-12-31 LAB — CBC WITH DIFFERENTIAL/PLATELET
BASO%: 0.2 % (ref 0.0–2.0)
Basophils Absolute: 0 10*3/uL (ref 0.0–0.1)
EOS%: 1.3 % (ref 0.0–7.0)
Eosinophils Absolute: 0.1 10*3/uL (ref 0.0–0.5)
HCT: 36.7 % (ref 34.8–46.6)
HEMOGLOBIN: 11.7 g/dL (ref 11.6–15.9)
LYMPH%: 18.2 % (ref 14.0–49.7)
MCH: 30 pg (ref 25.1–34.0)
MCHC: 31.9 g/dL (ref 31.5–36.0)
MCV: 94.1 fL (ref 79.5–101.0)
MONO#: 0.5 10*3/uL (ref 0.1–0.9)
MONO%: 10.3 % (ref 0.0–14.0)
NEUT#: 3.2 10*3/uL (ref 1.5–6.5)
NEUT%: 70 % (ref 38.4–76.8)
PLATELETS: 232 10*3/uL (ref 145–400)
RBC: 3.9 10*6/uL (ref 3.70–5.45)
RDW: 13 % (ref 11.2–14.5)
WBC: 4.6 10*3/uL (ref 3.9–10.3)
lymph#: 0.8 10*3/uL — ABNORMAL LOW (ref 0.9–3.3)

## 2015-12-31 LAB — COMPREHENSIVE METABOLIC PANEL
ALBUMIN: 3.6 g/dL (ref 3.5–5.0)
ALT: 12 U/L (ref 0–55)
AST: 17 U/L (ref 5–34)
Alkaline Phosphatase: 75 U/L (ref 40–150)
Anion Gap: 8 mEq/L (ref 3–11)
BILIRUBIN TOTAL: 0.53 mg/dL (ref 0.20–1.20)
BUN: 15.9 mg/dL (ref 7.0–26.0)
CALCIUM: 9.3 mg/dL (ref 8.4–10.4)
CHLORIDE: 103 meq/L (ref 98–109)
CO2: 28 mEq/L (ref 22–29)
CREATININE: 0.7 mg/dL (ref 0.6–1.1)
EGFR: >90 mL/min/{1.73_m2} (ref >90)
Glucose: 96 mg/dl (ref 70–140)
Potassium: 4.2 mEq/L (ref 3.5–5.1)
Sodium: 139 mEq/L (ref 136–145)
TOTAL PROTEIN: 7 g/dL (ref 6.4–8.3)

## 2015-12-31 NOTE — Progress Notes (Signed)
Anna Valentine  Telephone:([phone] [phone]) [phone]     ID: Anna Valentine DOB: [birth year]  MR#: [id number]  GYB#:[id number]  Patient Care Team: [name], MD as PCP - General (Internal Medicine) PCP: [name], MD GYN: Silvestre Moment SU: Osborn Coho MD] [name] MD OTHER MD: [name] M.D.  CHIEF COMPLAINT: (1) Estrogen receptor positive breast cancer   (2) history of stage IA right lung adenocarcinoma resected November 2006   CURRENT TREATMENT: observation   BREAST CANCER HISTORY:  From Dr. [name] original intake note 07/26/2005: (Lung cancer presentation)  "The patient is a very pleasant 65 year old female without a significant past medical history, who states that in 04/2005 she felt a lump in the left neck.  It did not resolve spontaneously, so she was seen by Dr. [name] [facility], and a CT scan of the neck was performed on 04/25/05.  There was noted to be two lymph nodes on the left, and they were normal in size, but they were asymmetrical.  No other pathologic findings were noted, except for left internal jugular vein, which was atypically positioned.  However, because of these enlarged lymph nodes, a CT scan of the chest was also obtained by Dr. Renold Genta on 04/27/05, and the CT scan did reveal a poorly defined nodular opacity medially in the right upper lobe, measuring 6.5 mg anterior to posterior.  Within the right lower lobe there was noted to be a lobular nodular mass measuring 13 x 13 mm, and was felt to be worrisome for a primary lung carcinoma.  No other nodules or effusions were seen on the left lung.  On the mediastinal window images there was slight prominence of right hilar nodes, but no other evidence of mediastinal or hilar adenopathy.  A PET scan was thereafter obtained on 05/06/05.  The PET scan did reveal increased FDG activity within a small mass in the posterior superior right lower lobe, highly suspicious for a  malignancy.  No significant nodal FDG uptake was identified within the hilar regions or the mediastinum.  Also noted was abnormal FDG uptake in the right middle lobe, corresponding to the CT scan findings, and again suspicious for a malignancy.  There was no evidence of metastatic disease within the neck, abdomen or pelvis.  The patient was then referred to [facility], and was seen by Dr. [name].  The patient underwent a right lower lobe wedge resection with completion lobectomy and mediastinal lymph node biopsies.  The pathology revealed the following:  Right upper lobe wedge resection revealed pulmonary tissue with necrotizing granulomatous inflammation.  No evidence of malignancy.  Right middle lobe wedge revealed again pulmonary tissue with necrotizing granulomatous inflammation.  No evidence of malignancy.  The right pleural lobe (lobectomy) revealed an adenocarcinoma, poorly differentiated, grade 3, measuring 1.2 cm.  The visceral pleura was negative.  Chest wall negative.  Mediastinum negative.  All margins were negative, including the pleural and parenchymal margins.  There was no evidence of lymphovascular invasion.  In all lymph nodes sampled, including level 11, level 7, level 12, 4R and 2R lymph nodes negative for malignancy.  The patient was staged as T1 N0 M0, stage IA adenocarcinoma of the right lung.  Postoperatively the patient's course was complicated by the development of pleural effusion, requiring diuresis.  She had multiple x-rays performed.  Last x-ray performed on 07/07/05 revealed persistent right pleural effusion.  It was recommended that the patient have followups at [facility].  Dr.  [name] Baxley kindly refers the patient to me today for medical oncology evaluation.  Clinically, the patient states that she has recovered well.  She is once again walking.  She was a runner, and she is trying to get back in shape."  From [name]'s 09/30/2009 note: (Breast cancer  presentation):  "She tells me that most recently she had her yearly screening mammogram performed that revealed an abnormality.  She also on exam was noted to have a palpable lump in the upper left breast as well.  Because of the abnormality, patient on September 02, 2009 had a digital diagnostic mammogram of the left breast and ultrasound of the left breast.  The mammogram revealed a 7.0 mm area slightly increased density in the upper left breast.  There were no suspicious calcifications noted.  The breast was heterogeneously dense.  Patient then went onto have an ultrasound of the breast performed and again a 7.0 mm irregular hypoechoic shadowing mass was noted in the 12 o'clock position of the left breast 5.0 cm from the nipple.  There were no enlarged or abnormal left axillary lymph nodes identified.  Patient went onto have a core biopsy performed on 09/02/2009 [phone]).  The needle core biopsy of the 12 o'clock mass revealed an invasive mammary carcinoma, low to intermediate grade.  It was lobular carcinoma.  Confirmatory immunohistochemical stains revealed the tumor to be strongly positive for cytokeratin AE1-AE3 with an infiltrative pattern of growth and the tumor was negative for E-cadherin stain confirming a lobular phenotype.  The tumor was estrogen receptor positive at 98%, progesterone receptor positive 6%, proliferation marker Ki-67 by MIB was 14%.  The tumor did not express HER-2/neu by CISH with a signal of 1.04.  Patient was seen by Dr. Neldon Mc on 09/08/2009 for discussion of surgical options.  His recommendation was a lumpectomy.  Patient also had bilateral MRI of the breasts performed, which revealed a 0.8 cm mildly enlarged area of mass-like enhancement at the 12 o'clock location corresponding to the biopsy-proven breast cancer.  There was no evidence of lymphadenopathy."  Her subsequent history is detailed below  INTERVAL HISTORY: [name] returns today for follow-up of her  estrogen receptor positive breast cancer. She has been off anastrozole since June 2016 she did not notice any change after going off that medication. She tolerated it quite well for course.  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 She helps a friend out with her business. 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 Overall the detailed review of systems today was negative    PAST MEDICAL HISTORY: Past Medical History  Diagnosis Date  . Osteoporosis   . STD (sexually transmitted disease)     HSV  . GERD (gastroesophageal reflux disease)   . Breast cancer ([place]) 08/2009    stage 2, rx with lumpectomy and xrt  . Lung cancer ([place]) 06/06/05    stage 1 poorly differentiated adenocarcinoma, s/p right lower lobectomy.  Marland Kitchen BRCA negative 2011    BRCA I/ II negative    PAST SURGICAL HISTORY: Past Surgical History  Procedure Laterality Date  . Lobectomy  06/06/2005    Right  . Tonsillectomy    . Tubal ligation  1984  . Bunionectomy    . Breast lumpectomy  10/2009    Left lumpectomy and radiation, stage II, ER/PR+, Her 2 nu negative  FAMILY HISTORY Family History  Problem Relation Age of Onset  . Allergies Mother   . Asthma Mother   . Lung cancer Mother   . Breast cancer Mother   . Colon cancer Father   . Prostate cancer Brother   . Breast cancer Sister 76    recurrence age 35 neg.  BRCA  . Breast cancer Maternal Grandmother     GYNECOLOGIC HISTORY:  No LMP recorded. Patient is postmenopausal. Menarche age 67, the patient is GX P0. She went through the change of life in approximately age 40. She did not take hormone replacement.  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 It's just the 2 of them at home. The patient is a Tourist information centre manager    ADVANCED DIRECTIVES: In place   HEALTH MAINTENANCE: Social  History  Substance Use Topics  . Smoking status: Former Smoker -- 2.00 packs/day for 18 years    Quit date: 07/12/1987  . Smokeless tobacco: Never Used  . Alcohol Use: 2.0 oz/week    4 drink(s) per week    Allergies  Allergen Reactions  . Clarithromycin     REACTION: rash    Current Outpatient Prescriptions  Medication Sig Dispense Refill  . acyclovir (ZOVIRAX) 400 MG tablet TAKE 1 TABLET BY MOUTH TWICE DAILY 90 tablet 1  . aspirin 81 MG tablet Take 81 mg by mouth daily.    . Calcium Carbonate (CALCIUM 600 PO) Take 600 mg by mouth daily.      . cholecalciferol (VITAMIN D) 1000 UNITS tablet Take 1,000 Units by mouth daily.    . Psyllium (METAMUCIL PO) Take by mouth daily.       No current facility-administered medications for this visit.    OBJECTIVE: Middle-aged white woman iwho appears well Filed Vitals:   12/31/15 1218  BP: 160/83  Pulse: 75  Temp: 98.1 F (36.7 C)  Resp: 18     Body mass index is 19.12 kg/(m^2).    ECOG FS:0 - Asymptomatic  Sclerae unicteric, EOMs intact Oropharynx clear, dentition in good repair No cervical or supraclavicular adenopathy Lungs no rales or rhonchi Heart regular rate and rhythm Abd soft, nontender, positive bowel sounds MSK no focal spinal tenderness, no upper extremity lymphedema Neuro: nonfocal, well oriented, appropriate affect Breasts: I do not palpate any suspicious masses in either breast. Both axillae are benign.   LAB RESULTS:  CMP     Component Value Date/Time   NA 139 09/29/2014 0923   NA 138 06/16/2014 1100   NA 137 03/23/2010 1019   K 5.1 09/29/2014 0923   K 4.3 06/16/2014 1100   K 4.6 03/23/2010 1019   CL 103 09/29/2014 0923   CL 103 11/22/2012 1025   CL 99 03/23/2010 1019   CO2 27 09/29/2014 0923   CO2 27 06/16/2014 1100   CO2 29 03/23/2010 1019   GLUCOSE 104* 09/29/2014 0923   GLUCOSE 86 06/16/2014 1100   GLUCOSE 94 11/22/2012 1025   GLUCOSE 95 03/23/2010 1019   BUN 19 09/29/2014 0923   BUN 14.0  06/16/2014 1100   BUN 16 03/23/2010 1019   CREATININE 0.77 09/29/2014 0923   CREATININE 0.7 06/16/2014 1100   CREATININE 0.70 11/14/2011 1031   CALCIUM 9.2 09/29/2014 0923   CALCIUM 9.3 06/16/2014 1100   CALCIUM 9.7 03/23/2010 1019   PROT 6.9 09/29/2014 0923   PROT 7.0 06/16/2014 1100   PROT 6.9 03/23/2010 1019   ALBUMIN 4.1 09/29/2014 0923  ALBUMIN 3.4* 06/16/2014 1100   ALBUMIN 4.0 03/23/2010 1019   AST 21 09/29/2014 0923   AST 22 06/16/2014 1100   AST 33 03/23/2010 1019   ALT 10 09/29/2014 0923   ALT 12 06/16/2014 1100   ALT 26 03/23/2010 1019   ALKPHOS 86 09/29/2014 0923   ALKPHOS 77 06/16/2014 1100   ALKPHOS 72 03/23/2010 1019   BILITOT 0.4 09/29/2014 0923   BILITOT 0.27 06/16/2014 1100   BILITOT 0.60 03/23/2010 1019   GFRNONAA 82 09/29/2014 0923   GFRNONAA >60 10/07/2009 1435   GFRAA >89 09/29/2014 0923   GFRAA  10/07/2009 1435    >60        The eGFR has been calculated using the MDRD equation. This calculation has not been validated in all clinical situations. eGFR's persistently <60 mL/min signify possible Chronic Kidney Disease.    INo results found for: SPEP, UPEP  Lab Results  Component Value Date   WBC 4.6 12/31/2015   NEUTROABS 3.2 12/31/2015   HGB 11.7 12/31/2015   HCT 36.7 12/31/2015   MCV 94.1 12/31/2015   PLT 232 12/31/2015      Chemistry      Component Value Date/Time   NA 139 09/29/2014 0923   NA 138 06/16/2014 1100   NA 137 03/23/2010 1019   K 5.1 09/29/2014 0923   K 4.3 06/16/2014 1100   K 4.6 03/23/2010 1019   CL 103 09/29/2014 0923   CL 103 11/22/2012 1025   CL 99 03/23/2010 1019   CO2 27 09/29/2014 0923   CO2 27 06/16/2014 1100   CO2 29 03/23/2010 1019   BUN 19 09/29/2014 0923   BUN 14.0 06/16/2014 1100   BUN 16 03/23/2010 1019   CREATININE 0.77 09/29/2014 0923   CREATININE 0.7 06/16/2014 1100   CREATININE 0.70 11/14/2011 1031      Component Value Date/Time   CALCIUM 9.2 09/29/2014 0923   CALCIUM 9.3 06/16/2014 1100    CALCIUM 9.7 03/23/2010 1019   ALKPHOS 86 09/29/2014 0923   ALKPHOS 77 06/16/2014 1100   ALKPHOS 72 03/23/2010 1019   AST 21 09/29/2014 0923   AST 22 06/16/2014 1100   AST 33 03/23/2010 1019   ALT 10 09/29/2014 0923   ALT 12 06/16/2014 1100   ALT 26 03/23/2010 1019   BILITOT 0.4 09/29/2014 0923   BILITOT 0.27 06/16/2014 1100   BILITOT 0.60 03/23/2010 1019       Lab Results  Component Value Date   LABCA2 18 09/30/2009    No components found for: [id number]  No results for input(s): INR in the last 168 hours.  Urinalysis    Component Value Date/Time   BILIRUBINUR neg 09/12/2013 1012   PROTEINUR neg 09/12/2013 1012   UROBILINOGEN negative 09/12/2013 1012   NITRITE neg 09/12/2013 1012   LEUKOCYTESUR Negative 09/12/2013 1012    STUDIES: CLINICAL DATA: History of left lumpectomy 2011. 1 year followup for right breast calcifications.  EXAM: DIGITAL DIAGNOSTIC BILATERAL MAMMOGRAM WITH 3D TOMOSYNTHESIS AND CAD  COMPARISON: Previous exam(s).  ACR Breast Density Category c: The breast tissue is heterogeneously dense, which may obscure small masses.  FINDINGS: Postoperative changes are seen in the left breast. No suspicious mass, distortion, or microcalcifications are identified to suggest presence of malignancy. Magnified views of right breast calcifications show benign, coarse calcifications without suspicious morphology.  Mammographic images were processed with CAD.  IMPRESSION: 1. Expected postoperative changes in the left breast. 2. Benign right breast calcifications.  RECOMMENDATION: Diagnostic mammogram is suggested  in 1 year. (Code:[id number]-B-01Y)  I have discussed the findings and recommendations with the patient. Results were also provided in writing at the conclusion of the visit. If applicable, a reminder letter will be sent to the patient regarding the next appointment.  BI-RADS CATEGORY 2: Benign.   Electronically Signed  By:  Nolon Nations M.D.  On: 09/23/2015 11:51       ASSESSMENT: 65 y.o. [place], [place] woman status post right upper lobe wedge resection, middle lobe wedge resection, lower lobectomy and mediastinal lymph node dissection 06/06/2005 for a 1.2 cm grade 3 adenocarcinoma, pT1 pN1, stage Ia  (a) followed at [facility] with every other year chest CT, next due July 2017  (1) status post left breast upper outer quadrant biopsy 09/02/2009 for an invasive lobular carcinoma (E-cadherin negative) estrogen receptor 98% positive, progesterone receptor 6% positive, with an MIB-1 of 14% and no HER-2 amplification. [[id number] [id number]]  (2) status post left lumpectomy and sentinel lymph node sampling 10/12/2009 for a pT1b pN1, stage IIA invasive lobular breast cancer, with negative margins.  (3) Oncotype DX score of 16 predicts a risk of outside the breast recurrence of 10% if the patient's only systemic treatment is tamoxifen for 5 years. It also predicts no benefit from adjuvant chemotherapy  (4) completed adjuvant radiation therapy 01/28/2010, receiving 5040 cGy to the left breast, with a boost to the upper inner aspect of the breast (to a cumulative dose of 6300 cGy); the axillary and supraclavicular regions received 4500 cGy  (5) started anastrozole July 2011, completedI 5 years July 2016  (6) bone density 02/21/2012 at [facility] showed osteoporosis with a T score of -2.5; on alendronate  PLAN: Mellonie is now 6 years out from definitive surgery for her breast cancer with no evidence of disease recurrence. This is very favorable.  We are following with observation. He feels reassured by the one to your visit here, and that these enough to arrange for. More importantly, she knows that we are available to her at any point if she has any questions or concerns.  She is due for repeat chest CT at [facility] in July. We have access to those results through care everywhere.  She will see me again a year from now. We will repeat  lab work a week before the visit. She knows to call for any other problems that may develop before then. Chauncey Cruel, MD   12/31/2015 12:42 PM

## 2015-12-31 NOTE — Telephone Encounter (Signed)
appt made and avs printed °

## 2016-02-03 ENCOUNTER — Telehealth: Payer: Self-pay | Admitting: Internal Medicine

## 2016-02-03 NOTE — Telephone Encounter (Signed)
She went for a check up at [facility] for her lungs.  It came back as questionable.  She had a PET scan yesterday.  The right lung is an issue.  They are unsure if it is cancer or infection.  So, they're going to schedule a biopsy and she will then follow up with the doctor to see what the plan will be.    She is unsure of what type of infection it could be if this is infection.  The doctor told her infection vs cancer is 50/50.  So, she just wanted to make you aware of the situation.    She has a CT scan that she can send to you if would like to see that, but she really just wanted to make you aware of what's going on.  She is just not sure what this infection could be if this is not cancer....she's pretty perplexed about that.  She just wanted to touch base with you in the interim.

## 2016-02-03 NOTE — Telephone Encounter (Signed)
I appreciate the info and hope all is well. It would not help for me to have a copy of the CT scan. She really is in good hands at [facility]. I hope for the best.

## 2016-02-10 ENCOUNTER — Other Ambulatory Visit: Payer: Self-pay | Admitting: Internal Medicine

## 2016-04-08 ENCOUNTER — Telehealth: Payer: Self-pay | Admitting: Internal Medicine

## 2016-04-08 ENCOUNTER — Ambulatory Visit (INDEPENDENT_AMBULATORY_CARE_PROVIDER_SITE_OTHER): Payer: BLUE CROSS/BLUE SHIELD | Admitting: Internal Medicine

## 2016-04-08 DIAGNOSIS — Z23 Encounter for immunization: Secondary | ICD-10-CM | POA: Diagnosis not present

## 2016-04-08 NOTE — Telephone Encounter (Signed)
Patient wanted to make you aware that she's still undergoing testing with the situation regarding her lungs at [facility].  However, they are still trying to determine whether she has cancer or whether this is an infection.  They are leaning towards an infection.  She wanted to bring you up to speed at the present time.    Just an FYI as of today.

## 2016-05-30 ENCOUNTER — Encounter: Payer: Self-pay | Admitting: Internal Medicine

## 2016-05-30 ENCOUNTER — Ambulatory Visit (INDEPENDENT_AMBULATORY_CARE_PROVIDER_SITE_OTHER): Payer: BLUE CROSS/BLUE SHIELD | Admitting: Internal Medicine

## 2016-05-30 VITALS — BP 140/80 | HR 87 | Temp 98.9°F | Ht 66.0 in | Wt 119.0 lb

## 2016-05-30 DIAGNOSIS — Z85118 Personal history of other malignant neoplasm of bronchus and lung: Secondary | ICD-10-CM | POA: Diagnosis not present

## 2016-05-30 DIAGNOSIS — R9389 Abnormal findings on diagnostic imaging of other specified body structures: Secondary | ICD-10-CM

## 2016-05-30 DIAGNOSIS — R509 Fever, unspecified: Secondary | ICD-10-CM | POA: Diagnosis not present

## 2016-05-30 DIAGNOSIS — J069 Acute upper respiratory infection, unspecified: Secondary | ICD-10-CM | POA: Diagnosis not present

## 2016-05-30 DIAGNOSIS — R938 Abnormal findings on diagnostic imaging of other specified body structures: Secondary | ICD-10-CM | POA: Diagnosis not present

## 2016-05-30 LAB — POCT RAPID STREP A (OFFICE): Rapid Strep A Screen: NEGATIVE

## 2016-05-30 MED ORDER — AZITHROMYCIN 250 MG PO TABS: ORAL_TABLET | ORAL | 0 refills | Status: DC

## 2016-05-30 MED ORDER — HYDROCOD POLST-CPM POLST ER 10-8 MG/5ML PO SUER: 5.0000 mL | Freq: Two times a day (BID) | ORAL | 0 refills | Status: DC | PRN

## 2016-05-30 NOTE — Patient Instructions (Signed)
Bring productive sputum back in sterile cup for evaluation. Zithromax Z-PAK take as directed. Tussionex 1 teaspoon by mouth every 12 hours when necessary cough. Rest and drink plenty of fluids.

## 2016-05-30 NOTE — Progress Notes (Signed)
   Subjective:    Patient ID: Anna Valentine, female    DOB: 02-27-1951, 65 y.o.   MRN: [id number]  HPI   65 year old White Female onset last week with sore throat subsequently developed cough. Says she had fever. Has begun to bring up some discolored sputum that she describes as green. She's been followed at [facility] recently and had a PET scan with abnormality noted in right upper lobe. Apparently is soft tissue mass measuring 56 x 22 mm and an area previously seen with paraseptal emphysema. Initially was set up for CT guided biopsy but radiology suggested close follow-up with CT imaging.  She has another CT planned in the next couple of weeks.  History of right upper lobe wedge resection in 2006, middle lobe wedge resection and lower lobectomy as well as mediastinal lymph node dissection. Pathology revealed 1.2 cm poorly differentiated adenocarcinoma stage TI N0. Prior to recent visit, was last seen July 2015 at [facility].  CT in August seen to indicate patient had decreased size of a groundglass opacity in right lateral upper lobe compared to July. However there is an irregular soft tissue mass in the posterior aspect of the right upper lobe unchanged. This area had low-grade activity on PET scan. It was postulated she may have an atypical mycobacterial infection. Neoplasm could not be excluded.      Review of Systems as above     Objective:   Physical Exam  Skin warm and dry. Nodes none. Neck is supple. Chest clear to auscultation without rales or wheezing. Rapid strep screen is negative. Pharynx is clear and TMs are clear      Assessment & Plan:  Acute URI with productive sputum  Abnormal chest CT with history of lung cancer  Plan: I have given her a sterile cup to produce sputum and bring back to the office for Gram stain, aerobic culture AFB smear and culture. She's been treated with Zithromax Z-PAK take 2 tablets by mouth day 1 followed by 1 tablet days 2 through 5. At  her request Tussionex 1 teaspoon by mouth every 12 hours when necessary cough.

## 2016-06-07 ENCOUNTER — Telehealth: Payer: Self-pay | Admitting: Internal Medicine

## 2016-06-07 NOTE — Telephone Encounter (Signed)
I would keep all of my care at [facility]. Can they not get her one there?

## 2016-06-07 NOTE — Telephone Encounter (Signed)
Patient wanted to report there was no sign of cancer in her lungs, but evidence of infection.  Also she needs a referral for a pulmonologist and wanted to know which one Dr. Renold Genta recommends.

## 2016-06-07 NOTE — Telephone Encounter (Signed)
Advised patient to stay with [facility] for her care.  She will ask who they recommend.

## 2016-07-11 HISTORY — PX: COLONOSCOPY: SHX174

## 2016-07-15 ENCOUNTER — Encounter: Payer: Self-pay | Admitting: Internal Medicine

## 2016-07-19 ENCOUNTER — Other Ambulatory Visit: Payer: Self-pay | Admitting: Internal Medicine

## 2016-07-19 NOTE — Telephone Encounter (Signed)
Refill Valtrex. She sent message about Pulmonary referral. I asked her to get [facility] to do it. Please call her and see if got done. Still not sure why she is not staying with [facility] with her history.

## 2016-08-17 ENCOUNTER — Other Ambulatory Visit: Payer: Self-pay | Admitting: Oncology

## 2016-08-17 DIAGNOSIS — Z853 Personal history of malignant neoplasm of breast: Secondary | ICD-10-CM

## 2016-09-01 ENCOUNTER — Encounter: Payer: Self-pay | Admitting: Internal Medicine

## 2016-09-01 ENCOUNTER — Ambulatory Visit (INDEPENDENT_AMBULATORY_CARE_PROVIDER_SITE_OTHER): Payer: Medicare Other | Admitting: Internal Medicine

## 2016-09-01 VITALS — BP 150/90 | Temp 99.3°F | Ht 66.0 in | Wt 120.0 lb

## 2016-09-01 DIAGNOSIS — T281XXA Burn of esophagus, initial encounter: Secondary | ICD-10-CM | POA: Diagnosis not present

## 2016-09-01 DIAGNOSIS — R938 Abnormal findings on diagnostic imaging of other specified body structures: Secondary | ICD-10-CM | POA: Diagnosis not present

## 2016-09-01 DIAGNOSIS — R9389 Abnormal findings on diagnostic imaging of other specified body structures: Secondary | ICD-10-CM

## 2016-09-01 DIAGNOSIS — Z85118 Personal history of other malignant neoplasm of bronchus and lung: Secondary | ICD-10-CM | POA: Diagnosis not present

## 2016-09-01 NOTE — Patient Instructions (Signed)
Zantac 150 mg twice daily or at least 7-10 days. Advance diet slowly. For now just stay with soft foods and liquids.
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
## 2016-09-09 ENCOUNTER — Encounter: Payer: Self-pay | Admitting: Emergency Medicine

## 2016-09-09 ENCOUNTER — Ambulatory Visit (INDEPENDENT_AMBULATORY_CARE_PROVIDER_SITE_OTHER): Payer: Medicare Other | Admitting: Emergency Medicine

## 2016-09-09 DIAGNOSIS — Z85118 Personal history of other malignant neoplasm of bronchus and lung: Secondary | ICD-10-CM

## 2016-09-09 DIAGNOSIS — J479 Bronchiectasis, uncomplicated: Secondary | ICD-10-CM

## 2016-09-09 NOTE — Progress Notes (Signed)
Subjective:    Patient ID: Anna Valentine, female    DOB: 1951/07/01, 66 y.o.   MRN: [id number]  HPI 66 year old former smoker with a history of non-small cell lung cancer status post lobectomy and right upper and middle lobe wedge resections. She also has a history of breast cancer post lumpectomy. 's been followed with serial CT scans of the chest at [facility] read on a scan 01/26/16 she was found to have a right upper lobe posterior soft tissue component with associated bronchiectasis and airway distortion, an inferior ground glass area. This prompted PET scans 02/04/16 and 8/28/17that I have reviewed showed decrease in size of an ill-defined hypermetabolic groundglass opacity and centrilobular nodularity in the lateral right upper lobe. There was a stable irregular soft tissue lesion in the RUL that was not hypermetabolic. Only a repeat CT scan of the chest on 06/08/16 shows continued bronchiectasis, increase in mucous plugging and right lower lobe consolidation. Stable left apical GGI as well. She has felt well. She does occasionally cough, can bring up green phlegm. Does not cough every day. She has experienced a single episode of hemoptysis in 11/2015.     Review of Systems  Constitutional: Negative.  Negative for fever and unexpected weight change.  HENT: Negative.  Negative for congestion, dental problem, ear pain, nosebleeds, postnasal drip, rhinorrhea, sinus pressure, sneezing, sore throat and trouble swallowing.   Eyes: Negative.  Negative for redness and itching.  Respiratory: Negative.  Negative for cough, chest tightness, shortness of breath and wheezing.   Cardiovascular: Negative.  Negative for palpitations and leg swelling.  Gastrointestinal: Negative.  Negative for nausea and vomiting.  Genitourinary: Negative.  Negative for dysuria.  Musculoskeletal: Negative.  Negative for joint swelling.  Skin: Negative.  Negative for rash.  Neurological: Negative.  Negative for  headaches.  Hematological: Negative.  Does not bruise/bleed easily.  Psychiatric/Behavioral: Negative.  Negative for dysphoric mood. The patient is not nervous/anxious.    Past Medical History:  Diagnosis Date  . BRCA negative 2011   BRCA I/ II negative  . Breast cancer ([place]) 08/2009   stage 2, rx with lumpectomy and xrt  . GERD (gastroesophageal reflux disease)   . Lung cancer ([place]) 06/06/05   stage 1 poorly differentiated adenocarcinoma, s/p right lower lobectomy.  . Osteoporosis   . STD (sexually transmitted disease)    HSV     Family History  Problem Relation Age of Onset  . Allergies Mother   . Asthma Mother   . Lung cancer Mother   . Breast cancer Mother   . Colon cancer Father   . Prostate cancer Brother   . Breast cancer Sister 23    recurrence age 16 neg.  BRCA  . Breast cancer Maternal Grandmother      Social History   Social History  . Marital status: Married    Spouse name: N/A  . Number of children: 0  . Years of education: N/A   Occupational History  . OFFICE MANAGER Physicist, medical   Social History Main Topics  . Smoking status: Former Smoker    Packs/day: 2.00    Years: 18.00    Quit date: 07/12/1987  . Smokeless tobacco: Never Used  . Alcohol use 2.0 oz/week    4 drink(s) per week  . Drug use: No  . Sexual activity: Yes   Other Topics Concern  . Not on file   Social History Narrative  . No narrative on file  Allergies  Allergen Reactions  . Clarithromycin     REACTION: rash     Outpatient Medications Prior to Visit  Medication Sig Dispense Refill  . acyclovir (ZOVIRAX) 400 MG tablet TAKE 1 TABLET BY MOUTH TWICE DAILY 90 tablet 0  . aspirin 81 MG tablet Take 81 mg by mouth daily.    . cholecalciferol (VITAMIN D) 1000 UNITS tablet Take 1,000 Units by mouth daily.    . Psyllium (METAMUCIL PO) Take by mouth daily.      . vitamin C (ASCORBIC ACID) 500 MG tablet Take 500 mg by mouth daily.    . Calcium Carbonate (CALCIUM 600 PO) Take  600 mg by mouth daily.      . chlorpheniramine-HYDROcodone (TUSSIONEX PENNKINETIC ER) 10-8 MG/5ML SUER Take 5 mLs by mouth every 12 (twelve) hours as needed for cough. (Patient not taking: Reported on 09/09/2016) 140 mL 0   No facility-administered medications prior to visit.         Objective:   Physical Exam Vitals:   09/09/16 1557  BP: (!) 142/80  Pulse: 78  SpO2: 99%  Weight: 122 lb (55.3 kg)  Height: '5\' 6"'  (1.676 m)  Gen: Pleasant, thin woman, well-nourished, in no distress,  normal affect  ENT: No lesions,  mouth clear,  oropharynx clear, no postnasal drip  Neck: No JVD, no TMG, no carotid bruits  Lungs: No use of accessory muscles, clear without rales or rhonchi  Cardiovascular: RRR, heart sounds normal, no murmur or gallops, no peripheral edema  Musculoskeletal: No deformities, no cyanosis or clubbing  Neuro: alert, non focal  Skin: Warm, no lesions or rashes       Assessment & Plan:  Bronchiectasis without acute exacerbation (HCC) Progression of bronchiectasis and areas of groundglass inflammation noted on her serial imaging from July 2017 to November 2017. PET scan in August did not show significant hypermetabolism. I suspect that she is colonized with Mycobacterium. We will obtain a sputum sample. If inadequate then we will likely perform bronchoscopy to get good culture data. Serial imaging as planned  History of lung cancer Status post right lower lobectomy 2006, wedge resections. No current evidence for recurrence based on imaging as described. Certain she will continue to need surveillance.  Baltazar Apo, MD, PhD 09/09/2016, 5:28 PM [facility] [phone] or if no answer [phone]

## 2016-09-09 NOTE — Assessment & Plan Note (Signed)
Status post right lower lobectomy 2006, wedge resections. No current evidence for recurrence based on imaging as described. Certain she will continue to need surveillance.

## 2016-09-09 NOTE — Patient Instructions (Signed)
We will obtain a sputum sample to send for culture information.  We may decide to pursue a bronchoscopy in the future We will follow your CT scan of the chest  Follow with Dr Lamonte Sakai in 1 month or next available to review your culture information.

## 2016-09-09 NOTE — Assessment & Plan Note (Signed)
Progression of bronchiectasis and areas of groundglass inflammation noted on her serial imaging from July 2017 to November 2017. PET scan in August did not show significant hypermetabolism. I suspect that she is colonized with Mycobacterium. We will obtain a sputum sample. If inadequate then we will likely perform bronchoscopy to get good culture data. Serial imaging as planned

## 2016-09-22 ENCOUNTER — Encounter: Payer: Self-pay | Admitting: Emergency Medicine

## 2016-09-23 ENCOUNTER — Ambulatory Visit
Admission: RE | Admit: 2016-09-23 | Discharge: 2016-09-23 | Disposition: A | Discharge status: Home / Self Care | Payer: Medicare Other | Source: Ambulatory Visit | Attending: Oncology | Admitting: Oncology

## 2016-09-23 DIAGNOSIS — R922 Inconclusive mammogram: Secondary | ICD-10-CM | POA: Diagnosis not present

## 2016-09-23 DIAGNOSIS — Z853 Personal history of malignant neoplasm of breast: Secondary | ICD-10-CM

## 2016-10-06 DIAGNOSIS — L720 Epidermal cyst: Secondary | ICD-10-CM | POA: Diagnosis not present

## 2016-10-06 DIAGNOSIS — L821 Other seborrheic keratosis: Secondary | ICD-10-CM | POA: Diagnosis not present

## 2016-10-14 DIAGNOSIS — L720 Epidermal cyst: Secondary | ICD-10-CM | POA: Diagnosis not present

## 2016-11-02 ENCOUNTER — Ambulatory Visit: Payer: Medicare Other | Admitting: Emergency Medicine

## 2016-11-05 ENCOUNTER — Ambulatory Visit (INDEPENDENT_AMBULATORY_CARE_PROVIDER_SITE_OTHER): Payer: 59

## 2016-11-05 ENCOUNTER — Ambulatory Visit (HOSPITAL_COMMUNITY)
Admission: EM | Admit: 2016-11-05 | Discharge: 2016-11-05 | Disposition: A | Discharge status: Home / Self Care | Payer: 59 | Attending: Internal Medicine | Admitting: Internal Medicine

## 2016-11-05 ENCOUNTER — Encounter (HOSPITAL_COMMUNITY): Payer: Self-pay | Admitting: Family Medicine

## 2016-11-05 DIAGNOSIS — S62363A Nondisplaced fracture of neck of third metacarpal bone, left hand, initial encounter for closed fracture: Secondary | ICD-10-CM

## 2016-11-05 DIAGNOSIS — W19XXXA Unspecified fall, initial encounter: Secondary | ICD-10-CM

## 2016-11-05 DIAGNOSIS — S62303A Unspecified fracture of third metacarpal bone, left hand, initial encounter for closed fracture: Secondary | ICD-10-CM | POA: Diagnosis not present

## 2016-11-05 MED ORDER — ACETAMINOPHEN 325 MG PO TABS
ORAL_TABLET | ORAL | Status: AC
  Filled 2016-11-05: qty 3

## 2016-11-05 MED ORDER — ACETAMINOPHEN 325 MG PO TABS
975.0000 mg | ORAL_TABLET | Freq: Once | ORAL | Status: AC
  Administered 2016-11-05: 975 mg via ORAL

## 2016-11-05 NOTE — ED Notes (Signed)
Ortho tech paged  

## 2016-11-05 NOTE — Progress Notes (Signed)
Orthopedic Tech Progress Note Patient Details:  Anna Valentine Dec 16, 1950 [id number]  Ortho Devices Type of Ortho Device: Ace wrap, Arm sling, Rad Gutter splint Ortho Device/Splint Location: lue Ortho Device/Splint Interventions: Application   Kiffany Schelling 11/05/2016, 1:56 PM

## 2016-11-05 NOTE — ED Triage Notes (Signed)
Pt here for left hand pain and swelling. sts she fell last night and braced herself with that hand.

## 2016-11-05 NOTE — ED Notes (Signed)
Ortho at bedside.

## 2016-11-05 NOTE — ED Provider Notes (Signed)
CSN: [id number]     Arrival date & time 11/05/16  1158 History   First MD Initiated Contact with Patient 11/05/16 1211     Chief Complaint  Patient presents with  . Hand Pain   (Consider location/radiation/quality/duration/timing/severity/associated sxs/prior Treatment) 66 year old female presents to clinic for evaluation of left hand pain. States she was cleaning a room last night when she tripped and fell forward any numbness hand. Denies any loss of consciousness, no blurred vision, headaches, nausea, loss of consciousness, or other systemic complaints. Pain and bruising's located at the base of the ring finger the left hand. Relieved by Motrin and worsened with movement.   The history is provided by the patient.  Fall  This is a new problem. The current episode started yesterday. The problem occurs constantly. The problem has been gradually worsening. Pertinent negatives include no chest pain, no abdominal pain, no headaches and no shortness of breath. Exacerbated by: movement. The symptoms are relieved by NSAIDs. The treatment provided mild relief.    Past Medical History:  Diagnosis Date  . BRCA negative 2011   BRCA I/ II negative  . Breast cancer ([place]) 08/2009   stage 2, rx with lumpectomy and xrt  . GERD (gastroesophageal reflux disease)   . Lung cancer ([place]) 06/06/05   stage 1 poorly differentiated adenocarcinoma, s/p right lower lobectomy.  . Osteoporosis   . STD (sexually transmitted disease)    HSV   Past Surgical History:  Procedure Laterality Date  . BREAST LUMPECTOMY  10/12/2009   Left lumpectomy and radiation, stage II, ER/PR+, Her 2 nu negative  . BUNIONECTOMY    . LOBECTOMY  06/06/2005   Right  . TONSILLECTOMY    . TUBAL LIGATION  1984   Family History  Problem Relation Age of Onset  . Allergies Mother   . Asthma Mother   . Lung cancer Mother   . Breast cancer Mother 89    Recurrance age 20  . Colon cancer Father   . Prostate cancer Brother   . Breast  cancer Sister 82    Recurrence age 23 BRCA negative  . Breast cancer Sister 8  . Breast cancer Maternal Grandmother 70   Social History  Substance Use Topics  . Smoking status: Former Smoker    Packs/day: 2.00    Years: 18.00    Quit date: 07/12/1987  . Smokeless tobacco: Never Used  . Alcohol use 2.0 oz/week    4 drink(s) per week   OB History    Gravida Para Term Preterm AB Living   2 0 0 0 2     SAB TAB Ectopic Multiple Live Births                 Review of Systems  Constitutional: Negative.   HENT: Negative.   Respiratory: Negative for cough and shortness of breath.   Cardiovascular: Negative for chest pain and palpitations.  Gastrointestinal: Negative for abdominal pain, nausea and vomiting.  Musculoskeletal: Positive for joint swelling. Negative for neck pain and neck stiffness.  Skin: Positive for color change. Negative for wound.  Neurological: Negative for dizziness, light-headedness and headaches.    Allergies  Clarithromycin  Home Medications   Prior to Admission medications   Medication Sig Start Date End Date Taking? Authorizing Provider  acyclovir (ZOVIRAX) 400 MG tablet TAKE 1 TABLET BY MOUTH TWICE DAILY 07/19/16   [name], MD  aspirin 81 MG tablet Take 81 mg by mouth daily.    Historical Provider,  MD  Calcium Carbonate (CALCIUM 600 PO) Take 600 mg by mouth daily.      Historical Provider, MD  chlorpheniramine-HYDROcodone (TUSSIONEX PENNKINETIC ER) 10-8 MG/5ML SUER Take 5 mLs by mouth every 12 (twelve) hours as needed for cough. Patient not taking: Reported on 09/09/2016 05/30/16   [name], MD  cholecalciferol (VITAMIN D) 1000 UNITS tablet Take 1,000 Units by mouth daily.    Historical Provider, MD  Psyllium (METAMUCIL PO) Take by mouth daily.      Historical Provider, MD  vitamin C (ASCORBIC ACID) 500 MG tablet Take 500 mg by mouth daily.    Historical Provider, MD   Meds Ordered and Administered this Visit   Medications  acetaminophen  (TYLENOL) tablet 975 mg (975 mg Oral Given 11/05/16 1233)    BP (!) 180/95   Pulse 85   Temp 98.3 F (36.8 C)   Resp 18   SpO2 100%  No data found.   Physical Exam  Constitutional: She is oriented to person, place, and time. She appears well-developed and well-nourished. No distress.  HENT:  Head: Normocephalic and atraumatic.  Right Ear: External ear normal.  Left Ear: External ear normal.  Musculoskeletal:       Left hand: She exhibits decreased range of motion, tenderness and swelling. She exhibits normal capillary refill, no deformity and no laceration. Normal sensation noted. Normal strength noted.       Hands: Neurological: She is alert and oriented to person, place, and time.  Skin: Skin is warm and dry. Capillary refill takes less than 2 seconds. No rash noted. She is not diaphoretic. No erythema.  Psychiatric: She has a normal mood and affect. Her behavior is normal.  Nursing note and vitals reviewed.   Urgent Care Course     Procedures (including critical care time)  Labs Review Labs Reviewed - No data to display  Imaging Review Dg Hand Complete Left  Result Date: 11/05/2016 CLINICAL DATA:  Pt tripped and fell forward yesterday and caught self with left hand. Pain on 3rd and 4th digit limited ROM, constant throbbing aching pain Occasional sharp pain with motion. Discoloration and swelling across 3rd and 4th MCP joints. EXAM: LEFT HAND - COMPLETE 3+ VIEW COMPARISON:  None. FINDINGS: Nondisplaced fracture of the head of the third metacarpal along the ulnar aspect. No other fracture or dislocation. Mild osteoarthritis of the first MCP joint. Intraosseous cyst in the distal half of the scaphoid. No soft tissue abnormality. IMPRESSION: 1. Nondisplaced fracture of the head of the third metacarpal along the ulnar aspect. Electronically Signed   By: Kathreen Devoid   On: 11/05/2016 12:36       MDM   1. Closed nondisplaced fracture of neck of third metacarpal bone of left  hand, initial encounter    Hand was placed in a radial gutter splint, counseling provided on over-the-counter use of medications for pain relief. Referral made to her hand orthopedist of choice.     Barnet Glasgow, NP 11/05/16 [phone]

## 2016-11-05 NOTE — Discharge Instructions (Signed)
You have a nondisplaced fracture of the head of the third metacarpal. We have placed your hand in the splint, and given you the contact information for hand specialist. Call his office Monday to schedule an appointment for follow-up care. For pain, recommend a combination of one 500 mg tablet of Tylenol, and two 200 mg tablets of ibuprofen every 6 hours as needed. May also use ice, and keep your hand elevated.

## 2016-11-08 DIAGNOSIS — M79642 Pain in left hand: Secondary | ICD-10-CM | POA: Diagnosis not present

## 2016-11-08 DIAGNOSIS — S62393A Other fracture of third metacarpal bone, left hand, initial encounter for closed fracture: Secondary | ICD-10-CM | POA: Diagnosis not present

## 2016-11-15 DIAGNOSIS — S62393D Other fracture of third metacarpal bone, left hand, subsequent encounter for fracture with routine healing: Secondary | ICD-10-CM | POA: Diagnosis not present

## 2016-11-16 ENCOUNTER — Telehealth: Payer: Self-pay | Admitting: Emergency Medicine

## 2016-11-16 ENCOUNTER — Ambulatory Visit (INDEPENDENT_AMBULATORY_CARE_PROVIDER_SITE_OTHER): Payer: Medicare Other | Admitting: Emergency Medicine

## 2016-11-16 ENCOUNTER — Encounter: Payer: Self-pay | Admitting: Emergency Medicine

## 2016-11-16 VITALS — BP 120/78 | HR 84 | Ht 66.0 in | Wt 119.0 lb

## 2016-11-16 DIAGNOSIS — J479 Bronchiectasis, uncomplicated: Secondary | ICD-10-CM | POA: Diagnosis not present

## 2016-11-16 NOTE — Assessment & Plan Note (Signed)
Clinically stable but with her history of groundglass infiltrate and prior lung cancer must consider possible slow-growing adenocarcinoma. I believe she needs a repeat CT scan of the chest now. If the groundglass changes then she may need bronchoscopy with biopsy. Bronchoscopy may actually be indicated for culture data given her bronchiectasis.

## 2016-11-16 NOTE — Progress Notes (Signed)
Subjective:    Patient ID: Anna Valentine, female    DOB: [birth year], 66 y.o.   MRN: [id number]  HPI 66 year old former smoker with a history of non-small cell lung cancer status post lobectomy and right upper and middle lobe wedge resections. She also has a history of breast cancer post lumpectomy. 's been followed with serial CT scans of the chest at [facility] read on a scan 01/26/16 she was found to have a right upper lobe posterior soft tissue component with associated bronchiectasis and airway distortion, an inferior ground glass area. This prompted PET scans 02/04/16 and 8/28/17that I have reviewed showed decrease in size of an ill-defined hypermetabolic groundglass opacity and centrilobular nodularity in the lateral right upper lobe. There was a stable irregular soft tissue lesion in the RUL that was not hypermetabolic. Only a repeat CT scan of the chest on 06/08/16 shows continued bronchiectasis, increase in mucous plugging and right lower lobe consolidation. Stable left apical GGI as well. She has felt well. She does occasionally cough, can bring up green phlegm. Does not cough every day. She has experienced a single episode of hemoptysis in 11/2015.    ROV 11/16/16 -- This follow-up visit for patient with a history of lung cancer status post wedge resections, bronchiectasis and suspected mycobacterial colonization although this has not been established. She reports that she has been doing well. Minimal mucous production.    Review of Systems  Constitutional: Negative.  Negative for fever and unexpected weight change.  HENT: Negative.  Negative for congestion, dental problem, ear pain, nosebleeds, postnasal drip, rhinorrhea, sinus pressure, sneezing, sore throat and trouble swallowing.   Eyes: Negative.  Negative for redness and itching.  Respiratory: Negative.  Negative for cough, chest tightness, shortness of breath and wheezing.   Cardiovascular: Negative.  Negative for palpitations  and leg swelling.  Gastrointestinal: Negative.  Negative for nausea and vomiting.  Genitourinary: Negative.  Negative for dysuria.  Musculoskeletal: Negative.  Negative for joint swelling.  Skin: Negative.  Negative for rash.  Neurological: Negative.  Negative for headaches.  Hematological: Negative.  Does not bruise/bleed easily.  Psychiatric/Behavioral: Negative.  Negative for dysphoric mood. The patient is not nervous/anxious.    Past Medical History:  Diagnosis Date  . BRCA negative 2011   BRCA I/ II negative  . Breast cancer ([place]) 08/2009   stage 2, rx with lumpectomy and xrt  . GERD (gastroesophageal reflux disease)   . Lung cancer ([place]) 06/06/05   stage 1 poorly differentiated adenocarcinoma, s/p right lower lobectomy.  . Osteoporosis   . STD (sexually transmitted disease)    HSV     Family History  Problem Relation Age of Onset  . Allergies Mother   . Asthma Mother   . Lung cancer Mother   . Breast cancer Mother 24    Recurrance age 51  . Colon cancer Father   . Prostate cancer Brother   . Breast cancer Sister 25    Recurrence age 17 BRCA negative  . Breast cancer Sister 45  . Breast cancer Maternal Grandmother 70     Social History   Social History  . Marital status: Married    Spouse name: N/A  . Number of children: 0  . Years of education: N/A   Occupational History  . OFFICE MANAGER Physicist, medical   Social History Main Topics  . Smoking status: Former Smoker    Packs/day: 2.00    Years: 18.00    Quit date: 07/12/1987  .  Smokeless tobacco: Never Used  . Alcohol use 2.0 oz/week    4 drink(s) per week  . Drug use: No  . Sexual activity: Yes   Other Topics Concern  . Not on file   Social History Narrative  . No narrative on file     Allergies  Allergen Reactions  . Clarithromycin     REACTION: rash     Outpatient Medications Prior to Visit  Medication Sig Dispense Refill  . acyclovir (ZOVIRAX) 400 MG tablet TAKE 1 TABLET BY MOUTH TWICE  DAILY 90 tablet 0  . aspirin 81 MG tablet Take 81 mg by mouth daily.    . Calcium Carbonate (CALCIUM 600 PO) Take 600 mg by mouth daily.      . cholecalciferol (VITAMIN D) 1000 UNITS tablet Take 1,000 Units by mouth daily.    . Psyllium (METAMUCIL PO) Take by mouth daily.      . vitamin C (ASCORBIC ACID) 500 MG tablet Take 500 mg by mouth daily.    . chlorpheniramine-HYDROcodone (TUSSIONEX PENNKINETIC ER) 10-8 MG/5ML SUER Take 5 mLs by mouth every 12 (twelve) hours as needed for cough. (Patient not taking: Reported on 09/09/2016) 140 mL 0   No facility-administered medications prior to visit.         Objective:   Physical Exam Vitals:   11/16/16 1612  BP: 120/78  Pulse: 84  SpO2: 98%  Weight: 119 lb (54 kg)  Height: _0  (1.676 m)  Gen: Pleasant, thin woman, well-nourished, in no distress,  normal affect  ENT: No lesions,  mouth clear,  oropharynx clear, no postnasal drip  Neck: No JVD, no TMG, no carotid bruits  Lungs: No use of accessory muscles, clear without rales or rhonchi  Cardiovascular: RRR, heart sounds normal, no murmur or gallops, no peripheral edema  Musculoskeletal: No deformities, no cyanosis or clubbing  Neuro: alert, non focal  Skin: Warm, no lesions or rashes       Assessment & Plan:  Bronchiectasis without acute exacerbation (HCC) Clinically stable but with her history of groundglass infiltrate and prior lung cancer must consider possible slow-growing adenocarcinoma. I believe she needs a repeat CT scan of the chest now. If the groundglass changes then she may need bronchoscopy with biopsy. Bronchoscopy may actually be indicated for culture data given her bronchiectasis.  Baltazar Apo, MD, PhD 11/16/2016, 5:40 PM [facility] [phone] or if no answer [phone]

## 2016-11-16 NOTE — Patient Instructions (Addendum)
We will arrange for a CT scan of your chest  We will hold off on bronchoscopy for now, revisit after your imaging.  Follow with Dr Lamonte Sakai next available after your CT scan to review

## 2016-11-17 NOTE — Telephone Encounter (Signed)
Please advise Anna Valentine where this patient can be worked in with [facility] sooner than 01/17/17(if able). Thanks.

## 2016-11-18 NOTE — Telephone Encounter (Signed)
Will route to [name] to advise on appt date and time she had in mind for patient according to note. Thanks.    Please advise so we can get patient scheduled in.

## 2016-11-18 NOTE — Telephone Encounter (Signed)
I was thinking 5/23 '@1'$ :30, is this ok RB?

## 2016-11-21 NOTE — Telephone Encounter (Signed)
Yes that is OK

## 2016-11-21 NOTE — Telephone Encounter (Signed)
Pt has been scheduled for 11/30/16 @ 1:30 per RB and Tamara. Pt is aware and voiced her understanding. Nothing further needed.

## 2016-11-25 ENCOUNTER — Ambulatory Visit (INDEPENDENT_AMBULATORY_CARE_PROVIDER_SITE_OTHER)
Admission: RE | Admit: 2016-11-25 | Discharge: 2016-11-25 | Disposition: A | Discharge status: Home / Self Care | Payer: Medicare Other | Source: Ambulatory Visit | Attending: Emergency Medicine | Admitting: Emergency Medicine

## 2016-11-25 DIAGNOSIS — J479 Bronchiectasis, uncomplicated: Secondary | ICD-10-CM

## 2016-11-25 DIAGNOSIS — R911 Solitary pulmonary nodule: Secondary | ICD-10-CM | POA: Diagnosis not present

## 2016-11-28 DIAGNOSIS — S62393D Other fracture of third metacarpal bone, left hand, subsequent encounter for fracture with routine healing: Secondary | ICD-10-CM | POA: Diagnosis not present

## 2016-11-28 DIAGNOSIS — Z4789 Encounter for other orthopedic aftercare: Secondary | ICD-10-CM | POA: Diagnosis not present

## 2016-11-28 DIAGNOSIS — M79642 Pain in left hand: Secondary | ICD-10-CM | POA: Diagnosis not present

## 2016-11-30 ENCOUNTER — Ambulatory Visit (INDEPENDENT_AMBULATORY_CARE_PROVIDER_SITE_OTHER): Payer: Medicare Other | Admitting: Emergency Medicine

## 2016-11-30 ENCOUNTER — Encounter: Payer: Self-pay | Admitting: Emergency Medicine

## 2016-11-30 DIAGNOSIS — R9389 Abnormal findings on diagnostic imaging of other specified body structures: Secondary | ICD-10-CM | POA: Insufficient documentation

## 2016-11-30 DIAGNOSIS — J479 Bronchiectasis, uncomplicated: Secondary | ICD-10-CM

## 2016-11-30 DIAGNOSIS — R938 Abnormal findings on diagnostic imaging of other specified body structures: Secondary | ICD-10-CM | POA: Diagnosis not present

## 2016-11-30 NOTE — Assessment & Plan Note (Signed)
CT scan from 11/26/15 shows stable changes left apex and right periphery, apex. We will plan to repeat her scan in December at [facility]. Follow-up afterwards to review.

## 2016-11-30 NOTE — Progress Notes (Signed)
Subjective:    Patient ID: Anna Valentine, female    DOB: 12-10-1950, 66 y.o.   MRN: [id number]  HPI 66 year old former smoker with a history of non-small cell lung cancer status post lobectomy and right upper and middle lobe wedge resections. She also has a history of breast cancer post lumpectomy. 's been followed with serial CT scans of the chest at [facility] read on a scan 01/26/16 she was found to have a right upper lobe posterior soft tissue component with associated bronchiectasis and airway distortion, an inferior ground glass area. This prompted PET scans 02/04/16 and 8/28/17that I have reviewed showed decrease in size of an ill-defined hypermetabolic groundglass opacity and centrilobular nodularity in the lateral right upper lobe. There was a stable irregular soft tissue lesion in the RUL that was not hypermetabolic. Only a repeat CT scan of the chest on 06/08/16 shows continued bronchiectasis, increase in mucous plugging and right lower lobe consolidation. Stable left apical GGI as well. She has felt well. She does occasionally cough, can bring up green phlegm. Does not cough every day. She has experienced a single episode of hemoptysis in 11/2015.    ROV 11/16/16 -- This follow-up visit for patient with a history of lung cancer status post wedge resections, bronchiectasis and suspected mycobacterial colonization although this has not been established. She reports that she has been doing well. Minimal mucous production.   ROV 11/30/16 -- This is a follow-up visit for patient with a history of bronchiectasis and possible chronic mycobacterial colonization (not proven). She also has a history of non-small cell lung cancers that have been treated with wedge resections in the past. We have been tracking some irregular soft tissue densities by CT scan and PET scan as detailed above. Most recent CT scan was 06/08/16. She underwent a repeat scan on 11/26/15 that I have personally reviewed. This shows  post surgical changes, widespread cylindrical and varicose bronchiectasis more on the right than on the left. Also some continued apical and peripheral pleural-based opacity that is stable compared with her prior examination.    Review of Systems  Constitutional: Negative.  Negative for fever and unexpected weight change.  HENT: Negative.  Negative for congestion, dental problem, ear pain, nosebleeds, postnasal drip, rhinorrhea, sinus pressure, sneezing, sore throat and trouble swallowing.   Eyes: Negative.  Negative for redness and itching.  Respiratory: Negative.  Negative for cough, chest tightness, shortness of breath and wheezing.   Cardiovascular: Negative.  Negative for palpitations and leg swelling.  Gastrointestinal: Negative.  Negative for nausea and vomiting.  Genitourinary: Negative.  Negative for dysuria.  Musculoskeletal: Negative.  Negative for joint swelling.  Skin: Negative.  Negative for rash.  Neurological: Negative.  Negative for headaches.  Hematological: Negative.  Does not bruise/bleed easily.  Psychiatric/Behavioral: Negative.  Negative for dysphoric mood. The patient is not nervous/anxious.    Past Medical History:  Diagnosis Date  . BRCA negative 2011   BRCA I/ II negative  . Breast cancer ([place]) 08/2009   stage 2, rx with lumpectomy and xrt  . GERD (gastroesophageal reflux disease)   . Lung cancer ([place]) 06/06/05   stage 1 poorly differentiated adenocarcinoma, s/p right lower lobectomy.  . Osteoporosis   . STD (sexually transmitted disease)    HSV     Family History  Problem Relation Age of Onset  . Allergies Mother   . Asthma Mother   . Lung cancer Mother   . Breast cancer Mother 73  Recurrance age 16  . Colon cancer Father   . Prostate cancer Brother   . Breast cancer Sister 30       Recurrence age 75 BRCA negative  . Breast cancer Sister 62  . Breast cancer Maternal Grandmother 70     Social History   Social History  . Marital status:  Married    Spouse name: N/A  . Number of children: 0  . Years of education: N/A   Occupational History  . OFFICE MANAGER Physicist, medical   Social History Main Topics  . Smoking status: Former Smoker    Packs/day: 2.00    Years: 18.00    Quit date: 07/12/1987  . Smokeless tobacco: Never Used  . Alcohol use 2.0 oz/week    4 drink(s) per week  . Drug use: No  . Sexual activity: Yes   Other Topics Concern  . Not on file   Social History Narrative  . No narrative on file     Allergies  Allergen Reactions  . Clarithromycin     REACTION: rash     Outpatient Medications Prior to Visit  Medication Sig Dispense Refill  . acyclovir (ZOVIRAX) 400 MG tablet TAKE 1 TABLET BY MOUTH TWICE DAILY 90 tablet 0  . aspirin 81 MG tablet Take 81 mg by mouth daily.    . Calcium Carbonate (CALCIUM 600 PO) Take 600 mg by mouth daily.      . cholecalciferol (VITAMIN D) 1000 UNITS tablet Take 1,000 Units by mouth daily.    . Psyllium (METAMUCIL PO) Take by mouth daily.      . vitamin C (ASCORBIC ACID) 500 MG tablet Take 500 mg by mouth daily.     No facility-administered medications prior to visit.         Objective:   Physical Exam Vitals:   11/30/16 1339  BP: 132/72  Pulse: 95  Resp: 18  SpO2: 100%  Weight: 115 lb 12.8 oz (52.5 kg)  Height: '5\' 6"'  (1.676 m)  Gen: Pleasant, thin woman, well-nourished, in no distress,  normal affect  ENT: No lesions,  mouth clear,  oropharynx clear, no postnasal drip  Neck: No JVD, no TMG, no carotid bruits  Lungs: No use of accessory muscles, clear without rales or rhonchi  Cardiovascular: RRR, heart sounds normal, no murmur or gallops, no peripheral edema  Musculoskeletal: No deformities, no cyanosis or clubbing  Neuro: alert, non focal  Skin: Warm, no lesions or rashes       Assessment & Plan:  Bronchiectasis without acute exacerbation (HCC) Clinically stable at this point. Minimal secretions. She will call if this changes.  Abnormal  CT of the chest CT scan from 11/26/15 shows stable changes left apex and right periphery, apex. We will plan to repeat her scan in December at [facility]. Follow-up afterwards to review.  Baltazar Apo, MD, PhD 11/30/2016, 2:01 PM [facility] [phone] or if no answer [phone]

## 2016-11-30 NOTE — Patient Instructions (Signed)
Agree with a repeat Ct scan of your chest (at [facility] as planned) to follow your bronchiectasis and pulmonary nodular changes.  Please call our office for any changes in your breathing, cough, mucous production.  Follow with Dr Lamonte Sakai in 6 months or sooner if you have any problems (after the Ct chest to review).

## 2016-11-30 NOTE — Assessment & Plan Note (Signed)
Clinically stable at this point. Minimal secretions. She will call if this changes.

## 2016-12-12 DIAGNOSIS — Z4789 Encounter for other orthopedic aftercare: Secondary | ICD-10-CM | POA: Diagnosis not present

## 2016-12-12 DIAGNOSIS — S62393D Other fracture of third metacarpal bone, left hand, subsequent encounter for fracture with routine healing: Secondary | ICD-10-CM | POA: Diagnosis not present

## 2016-12-22 ENCOUNTER — Other Ambulatory Visit (HOSPITAL_BASED_OUTPATIENT_CLINIC_OR_DEPARTMENT_OTHER): Payer: Medicare Other

## 2016-12-22 DIAGNOSIS — Z853 Personal history of malignant neoplasm of breast: Secondary | ICD-10-CM | POA: Diagnosis present

## 2016-12-22 DIAGNOSIS — C50212 Malignant neoplasm of upper-inner quadrant of left female breast: Secondary | ICD-10-CM

## 2016-12-22 LAB — COMPREHENSIVE METABOLIC PANEL
ALBUMIN: 3.8 g/dL (ref 3.5–5.0)
ALK PHOS: 81 U/L (ref 40–150)
ALT: 10 U/L (ref 0–55)
ANION GAP: 8 meq/L (ref 3–11)
AST: 18 U/L (ref 5–34)
BILIRUBIN TOTAL: 0.65 mg/dL (ref 0.20–1.20)
BUN: 13.2 mg/dL (ref 7.0–26.0)
CO2: 28 meq/L (ref 22–29)
Calcium: 9.7 mg/dL (ref 8.4–10.4)
Chloride: 103 mEq/L (ref 98–109)
Creatinine: 0.7 mg/dL (ref 0.6–1.1)
GLUCOSE: 92 mg/dL (ref 70–140)
POTASSIUM: 4.2 meq/L (ref 3.5–5.1)
SODIUM: 139 meq/L (ref 136–145)
TOTAL PROTEIN: 7 g/dL (ref 6.4–8.3)

## 2016-12-22 LAB — CBC WITH DIFFERENTIAL/PLATELET
BASO%: 0.5 % (ref 0.0–2.0)
BASOS ABS: 0 10*3/uL (ref 0.0–0.1)
EOS ABS: 0.1 10*3/uL (ref 0.0–0.5)
EOS%: 1.5 % (ref 0.0–7.0)
HCT: 35.8 % (ref 34.8–46.6)
HGB: 11.9 g/dL (ref 11.6–15.9)
LYMPH%: 18.1 % (ref 14.0–49.7)
MCH: 31 pg (ref 25.1–34.0)
MCHC: 33.3 g/dL (ref 31.5–36.0)
MCV: 93.2 fL (ref 79.5–101.0)
MONO#: 0.5 10*3/uL (ref 0.1–0.9)
MONO%: 10.9 % (ref 0.0–14.0)
NEUT#: 3.2 10*3/uL (ref 1.5–6.5)
NEUT%: 69 % (ref 38.4–76.8)
Platelets: 275 10*3/uL (ref 145–400)
RBC: 3.84 10*6/uL (ref 3.70–5.45)
RDW: 13 % (ref 11.2–14.5)
WBC: 4.6 10*3/uL (ref 3.9–10.3)
lymph#: 0.8 10*3/uL — ABNORMAL LOW (ref 0.9–3.3)

## 2016-12-26 DIAGNOSIS — S62393D Other fracture of third metacarpal bone, left hand, subsequent encounter for fracture with routine healing: Secondary | ICD-10-CM | POA: Diagnosis not present

## 2016-12-26 DIAGNOSIS — Z4789 Encounter for other orthopedic aftercare: Secondary | ICD-10-CM | POA: Diagnosis not present

## 2016-12-29 ENCOUNTER — Ambulatory Visit (HOSPITAL_BASED_OUTPATIENT_CLINIC_OR_DEPARTMENT_OTHER): Payer: Medicare Other | Admitting: Oncology

## 2016-12-29 ENCOUNTER — Encounter: Payer: Self-pay | Admitting: Oncology

## 2016-12-29 VITALS — BP 165/77 | HR 68 | Temp 98.0°F | Resp 18 | Ht 66.0 in | Wt 116.3 lb

## 2016-12-29 DIAGNOSIS — Z853 Personal history of malignant neoplasm of breast: Secondary | ICD-10-CM

## 2016-12-29 DIAGNOSIS — M810 Age-related osteoporosis without current pathological fracture: Secondary | ICD-10-CM

## 2016-12-29 DIAGNOSIS — Z170 Estrogen receptor positive status [ER+]: Principal | ICD-10-CM

## 2016-12-29 DIAGNOSIS — C50212 Malignant neoplasm of upper-inner quadrant of left female breast: Secondary | ICD-10-CM

## 2016-12-29 NOTE — Progress Notes (Signed)
Thanks very much for the referral. We will get her in for her exam and I will let you know the results. Richardson Landry

## 2016-12-29 NOTE — Progress Notes (Signed)
[place]  [phone] [phone]) [phone]     ID: Anna Valentine DOB: 08/13/1950  MR#: [id number]  BHA#:[id number]  Patient Care Team: [name], MD as PCP - General (Internal Medicine) Magrinat, Virgie Dad, MD as Consulting Physician (Oncology) Collene Gobble, MD as Consulting Physician (Pulmonary Disease) [name], MD as Referring Physician Armbruster, Renelda Loma, MD as Consulting Physician (Gastroenterology) PCP: [name], MD GYN: Silvestre Moment SU: Osborn Coho MD] [name] MD OTHER MD: [name] M.D.  CHIEF COMPLAINT: (1) Estrogen receptor positive breast cancer   (2) history of stage IA right lung adenocarcinoma resected November 2006   CURRENT TREATMENT: observation   BREAST CANCER HISTORY:  From Dr. [name] original intake note 07/26/2005: (Lung cancer presentation)  "The patient is a very pleasant 66 year old female without a significant past medical history, who states that in 04/2005 she felt a lump in the left neck.  It did not resolve spontaneously, so she was seen by Dr. [name] [facility], and a CT scan of the neck was performed on 04/25/05.  There was noted to be two lymph nodes on the left, and they were normal in size, but they were asymmetrical.  No other pathologic findings were noted, except for left internal jugular vein, which was atypically positioned.  However, because of these enlarged lymph nodes, a CT scan of the chest was also obtained by Dr. Renold Genta on 04/27/05, and the CT scan did reveal a poorly defined nodular opacity medially in the right upper lobe, measuring 6.5 mg anterior to posterior.  Within the right lower lobe there was noted to be a lobular nodular mass measuring 13 x 13 mm, and was felt to be worrisome for a primary lung carcinoma.  No other nodules or effusions were seen on the left lung.  On the mediastinal window images there was slight prominence of right hilar nodes, but no  other evidence of mediastinal or hilar adenopathy.  A PET scan was thereafter obtained on 05/06/05.  The PET scan did reveal increased FDG activity within a small mass in the posterior superior right lower lobe, highly suspicious for a malignancy.  No significant nodal FDG uptake was identified within the hilar regions or the mediastinum.  Also noted was abnormal FDG uptake in the right middle lobe, corresponding to the CT scan findings, and again suspicious for a malignancy.  There was no evidence of metastatic disease within the neck, abdomen or pelvis.  The patient was then referred to [facility], and was seen by Dr. [name].  The patient underwent a right lower lobe wedge resection with completion lobectomy and mediastinal lymph node biopsies.  The pathology revealed the following:  Right upper lobe wedge resection revealed pulmonary tissue with necrotizing granulomatous inflammation.  No evidence of malignancy.  Right middle lobe wedge revealed again pulmonary tissue with necrotizing granulomatous inflammation.  No evidence of malignancy.  The right pleural lobe (lobectomy) revealed an adenocarcinoma, poorly differentiated, grade 3, measuring 1.2 cm.  The visceral pleura was negative.  Chest wall negative.  Mediastinum negative.  All margins were negative, including the pleural and parenchymal margins.  There was no evidence of lymphovascular invasion.  In all lymph nodes sampled, including level 11, level 7, level 12, 4R and 2R lymph nodes negative for malignancy.  The patient was staged as T1 N0 M0, stage IA adenocarcinoma of the right lung.  Postoperatively the patient's course was complicated by the development of pleural effusion, requiring  diuresis.  She had multiple x-rays performed.  Last x-ray performed on 07/07/05 revealed persistent right pleural effusion.  It was recommended that the patient have followups at [facility].  Dr. [name] Baxley kindly refers the patient to me today for medical oncology  evaluation.  Clinically, the patient states that she has recovered well.  She is once again walking.  She was a runner, and she is trying to get back in shape."  From [name]'s 09/30/2009 note: (Breast cancer presentation):  "She tells me that most recently she had her yearly screening mammogram performed that revealed an abnormality.  She also on exam was noted to have a palpable lump in the upper left breast as well.  Because of the abnormality, patient on September 02, 2009 had a digital diagnostic mammogram of the left breast and ultrasound of the left breast.  The mammogram revealed a 7.0 mm area slightly increased density in the upper left breast.  There were no suspicious calcifications noted.  The breast was heterogeneously dense.  Patient then went onto have an ultrasound of the breast performed and again a 7.0 mm irregular hypoechoic shadowing mass was noted in the 12 o'clock position of the left breast 5.0 cm from the nipple.  There were no enlarged or abnormal left axillary lymph nodes identified.  Patient went onto have a core biopsy performed on 09/02/2009 [phone]).  The needle core biopsy of the 12 o'clock mass revealed an invasive mammary carcinoma, low to intermediate grade.  It was lobular carcinoma.  Confirmatory immunohistochemical stains revealed the tumor to be strongly positive for cytokeratin AE1-AE3 with an infiltrative pattern of growth and the tumor was negative for E-cadherin stain confirming a lobular phenotype.  The tumor was estrogen receptor positive at 98%, progesterone receptor positive 6%, proliferation marker Ki-67 by MIB was 14%.  The tumor did not express HER-2/neu by CISH with a signal of 1.04.  Patient was seen by Dr. Neldon Mc on 09/08/2009 for discussion of surgical options.  His recommendation was a lumpectomy.  Patient also had bilateral MRI of the breasts performed, which revealed a 0.8 cm mildly enlarged area of mass-like enhancement at the 12  o'clock location corresponding to the biopsy-proven breast cancer.  There was no evidence of lymphadenopathy."  Her subsequent history is detailed below  INTERVAL HISTORY: Anna Valentine returns today for follow-up of her estrogen receptor positive breast cancer. She is being followed by observation alone. She had her mammogram in March, which showed no evidence of malignancy. She has not noted any change in either breast.  Quite aside from the breast cancer she has significant bronchiectasis problems. She is now followed by Dr. Lamonte Sakai here as well as Dr. [name] at [facility]. She had several scans within the past year due to a concern regarding a mass. This is best summarized from Dr. Florentina Jenny 06/07/2016 note:  "Briefly she was seen in surveillance on 01/21/2016 at which time she was noted to have a new right upper lobe posterior soft tissue mass measuring 56 x 22 mm. A PET/CT on 02/02/2016 showed a multifocal hypermetabolic consolidation within the right lung as well as focal FDG uptake in the left sided nodules. She was set up for a CT guided biopsy but radiology felt that a close interval repeat scan was warranted prior to biopsy. She therefore returned on 03/03/2016 with a CT that was grossly unchanged with regards to the right upper lobe soft tissue mass and GGO and centrilobular nodularity in the right upper  lobe had decreased. "   She just had a repeat CT of the chest in May here in town which shows significant abnormalities related to her bronchiectasis and surgical history but no evidence of recurrent or metastatic cancer   REVIEW OF SYSTEMS: Quite aside from the breast and lung she had a fall in April. She heard a "pop" after her candidate ground while she tried to break the fall. Left  hand films showed a nondisplaced fracture of the head of the third metacarpal along the ulnar aspect. 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 Aside from this a detailed review of systems today was stable  PAST MEDICAL HISTORY: Past Medical History:  Diagnosis Date  . BRCA negative 2011   BRCA I/ II negative  . Breast cancer ([place]) 08/2009   stage 2, rx with lumpectomy and xrt  . GERD (gastroesophageal reflux disease)   . Lung cancer ([place]) 06/06/05   stage 1 poorly differentiated adenocarcinoma, s/p right lower lobectomy.  . Osteoporosis   . STD (sexually transmitted disease)    HSV    PAST SURGICAL HISTORY: Past Surgical History:  Procedure Laterality Date  . BREAST LUMPECTOMY  10/12/2009   Left lumpectomy and radiation, stage II, ER/PR+, Her 2 nu negative  . BUNIONECTOMY    . LOBECTOMY  06/06/2005   Right  . TONSILLECTOMY    . TUBAL LIGATION  1984    FAMILY HISTORY Family History  Problem Relation Age of Onset  . Allergies Mother   . Asthma Mother   . Lung cancer Mother   . Breast cancer Mother 74       Recurrance age 34  . Colon cancer Father   . Prostate cancer Brother   . Breast cancer Sister 46       Recurrence age 52 BRCA negative  . Breast cancer Sister 93  . Breast cancer Maternal Grandmother 69    GYNECOLOGIC HISTORY:  No LMP recorded. Patient is postmenopausal. Menarche age 21, the patient is GX P0. She went through the change of life in approximately age 26. She did not take hormone replacement.  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 It's just the 2 of them at home. She takes care of 28-year-old for a neighbor and is the primary caregiver for her husband who has had a couple of small strokes and has myelodysplasia. She helps a friend out with her business. She walks her 45 year old lab. 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 Smoking status: Former  Smoker    Packs/day: 2.00    Years: 18.00    Quit date: 07/12/1987  . Smokeless tobacco: Never Used  . Alcohol use 2.0 oz/week    4 drink(s) per week    Allergies  Allergen Reactions  . Clarithromycin     REACTION: rash    Current Outpatient Prescriptions  Medication Sig Dispense Refill  . acyclovir (ZOVIRAX) 400 MG tablet TAKE 1 TABLET BY MOUTH TWICE DAILY 90 tablet 0  . aspirin 81 MG tablet Take 81 mg by mouth daily.    . Calcium Carbonate (CALCIUM 600 PO) Take 600 mg by mouth daily.      Marland Kitchen  cholecalciferol (VITAMIN D) 1000 UNITS tablet Take 1,000 Units by mouth daily.    . Psyllium (METAMUCIL PO) Take by mouth daily.      . vitamin C (ASCORBIC ACID) 500 MG tablet Take 500 mg by mouth daily.     No current facility-administered medications for this visit.     OBJECTIVE: Middle-aged white womanIn no acute distress  Vitals:   12/29/16 1203  BP: (!) 165/77  Pulse: 68  Resp: 18  Temp: 98 F (36.7 C)     Body mass index is 18.77 kg/m.    ECOG FS:0 - Asymptomatic  Sclerae unicteric, pupils round and equal Oropharynx clear and moist No cervical or supraclavicular adenopathy Lungs no rales or rhonchi Heart regular rate and rhythm Abd soft, nontender, positive bowel sounds MSK no focal spinal tenderness, no upper extremity lymphedema Neuro: nonfocal, well oriented, appropriate affect Breasts: The right breast is benign. The left breast is status post lumpectomy and radiation. The cosmetic result is good. There are some telangiectasias relating to the radiation. There is no evidence of local recurrence. Both axillae are benign.  LAB RESULTS:  CMP     Component Value Date/Time   NA 139 12/22/2016 1141   K 4.2 12/22/2016 1141   CL 103 09/29/2014 0923   CL 103 11/22/2012 1025   CO2 28 12/22/2016 1141   GLUCOSE 92 12/22/2016 1141   GLUCOSE 94 11/22/2012 1025   BUN 13.2 12/22/2016 1141   CREATININE 0.7 12/22/2016 1141   CALCIUM 9.7 12/22/2016 1141   PROT 7.0 12/22/2016  1141   ALBUMIN 3.8 12/22/2016 1141   AST 18 12/22/2016 1141   ALT 10 12/22/2016 1141   ALKPHOS 81 12/22/2016 1141   BILITOT 0.65 12/22/2016 1141   GFRNONAA 82 09/29/2014 0923   GFRAA >89 09/29/2014 0923    INo results found for: SPEP, UPEP  Lab Results  Component Value Date   WBC 4.6 12/22/2016   NEUTROABS 3.2 12/22/2016   HGB 11.9 12/22/2016   HCT 35.8 12/22/2016   MCV 93.2 12/22/2016   PLT 275 12/22/2016      Chemistry      Component Value Date/Time   NA 139 12/22/2016 1141   K 4.2 12/22/2016 1141   CL 103 09/29/2014 0923   CL 103 11/22/2012 1025   CO2 28 12/22/2016 1141   BUN 13.2 12/22/2016 1141   CREATININE 0.7 12/22/2016 1141      Component Value Date/Time   CALCIUM 9.7 12/22/2016 1141   ALKPHOS 81 12/22/2016 1141   AST 18 12/22/2016 1141   ALT 10 12/22/2016 1141   BILITOT 0.65 12/22/2016 1141       Lab Results  Component Value Date   LABCA2 18 09/30/2009    No components found for: OEVOJ500  No results for input(s): INR in the last 168 hours.  Urinalysis    Component Value Date/Time   BILIRUBINUR neg 09/12/2013 1012   PROTEINUR neg 09/12/2013 1012   UROBILINOGEN negative 09/12/2013 1012   NITRITE neg 09/12/2013 1012   LEUKOCYTESUR Negative 09/12/2013 1012    STUDIES: Bilateral mammography with tomography at the [place] 09/23/2016 showed the breast density to be category C. There was no evidence of malignancy.  Also on 11/25/2016 she had a chest CT scan for follow-up of her pulmonary nodule and bronchiectasis. This was concurred with a CT scan from December 2011. The current scan showed significant right-sided volume loss in this patient status post right lower lobectomy remotely., aortic atherosclerosis with 3  vessel coronary artery disease and significant bronchiectasis. A lesion in the right kidney was felt to be most likely a cyst.  ASSESSMENT: 66 y.o. [place], [place] woman status post right upper lobe wedge resection, middle lobe wedge  resection, lower lobectomy and mediastinal lymph node dissection 06/06/2005 for a 1.2 cm grade 3 adenocarcinoma, pT1 pN1, stage Ia  (a) followed at [facility] with every other year chest CT, next due July 2017  (1) status post left breast upper outer quadrant biopsy 09/02/2009 for an invasive lobular carcinoma (E-cadherin negative) estrogen receptor 98% positive, progesterone receptor 6% positive, with an MIB-1 of 14% and no HER-2 amplification. [[id number]]  (2) status post left lumpectomy and sentinel lymph node sampling 10/12/2009 for a pT1b pN1, stage IIA invasive lobular breast cancer, with negative margins.  (3) Oncotype DX score of 16 predicts a risk of outside the breast recurrence of 10% if the patient's only systemic treatment is tamoxifen for 5 years. It also predicts no benefit from adjuvant chemotherapy  (4) completed adjuvant radiation therapy 01/28/2010, receiving 5040 cGy to the left breast, with a boost to the upper inner aspect of the breast (to a cumulative dose of 6300 cGy); the axillary and supraclavicular regions received 4500 cGy  (5) started anastrozole July 2011, completedI 5 years July 2016  (6) bone density 02/21/2012 at [facility] showed osteoporosis with a T score of -2.5; on alendronate  PLAN: Anna Valentine is now just over 7 years out from definitive surgery for her breast cancer with no evidence of disease recurrence. This is very favorable.  Her main problems of course her pulmonary but luckily at present she is entirely asymptomatic. She will be followed by Dr. Abigail Butts at [facility] in November and Dr. Lamonte Sakai again next May.  She is a bit behind on her health maintenance. She has been postponing the colonoscopy. I have gone ahead and re-referred her to GI. Hopeful she will get that done later this year.  She will have her next mammogram in March. She is going to continue to see me on a yearly basis chiefly for moral support. Of course I will be glad to see her before then if the  need arises. Chauncey Cruel, MD   12/29/2016 12:58 PM

## 2016-12-30 ENCOUNTER — Telehealth: Payer: Self-pay

## 2016-12-30 NOTE — Telephone Encounter (Signed)
-----   Message from Manus Gunning, MD sent at 12/29/2016  9:39 PM EDT ----- Regarding: colonoscopy Hi Almyra Free, Dr. Jana Hakim asked me to perform a screening colonoscopy on this patient. Would you mind calling her to get her scheduled? Can direct book, I don't see any issues that warrants a clinic visit otherwise. Thanks  ----- Message ----- From: Chauncey Cruel, MD Sent: 12/29/2016  12:28 PM To: Manus Gunning, MD

## 2016-12-30 NOTE — Telephone Encounter (Signed)
Called patient, left vm asking her to contact our office. We had received a referral for a direct book screening colonoscopy. Patient will need a pre-visit and may be scheduled at [facility].

## 2017-01-02 ENCOUNTER — Encounter: Payer: Self-pay | Admitting: Gastroenterology

## 2017-02-22 ENCOUNTER — Ambulatory Visit (AMBULATORY_SURGERY_CENTER): Payer: Self-pay | Admitting: *Deleted

## 2017-02-22 VITALS — Ht 66.0 in | Wt 115.2 lb

## 2017-02-22 DIAGNOSIS — Z800 Family history of malignant neoplasm of digestive organs: Secondary | ICD-10-CM

## 2017-02-22 MED ORDER — NA SULFATE-K SULFATE-MG SULF 17.5-3.13-1.6 GM/177ML PO SOLN: 1.0000 [IU] | Freq: Once | ORAL | 0 refills | Status: AC

## 2017-02-22 NOTE — Progress Notes (Signed)
No egg or soy allergy known to patient  No issues with past sedation with any surgeries  or procedures, no intubation problems  No diet pills per patient No home 02 use per patient  No blood thinners per patient  Pt denies issues with constipation  No A fib or A flutter  EMMI video sent to pt's e mail  Pt. declined 

## 2017-03-08 ENCOUNTER — Encounter: Payer: Medicare Other | Admitting: Gastroenterology

## 2017-03-20 ENCOUNTER — Encounter: Payer: Self-pay | Admitting: Gastroenterology

## 2017-03-20 ENCOUNTER — Ambulatory Visit (AMBULATORY_SURGERY_CENTER): Payer: Medicare Other | Admitting: Gastroenterology

## 2017-03-20 VITALS — BP 140/64 | HR 74 | Temp 98.0°F | Resp 18 | Ht 66.0 in | Wt 116.0 lb

## 2017-03-20 DIAGNOSIS — Z86010 Personal history of colonic polyps: Secondary | ICD-10-CM | POA: Diagnosis not present

## 2017-03-20 DIAGNOSIS — Z800 Family history of malignant neoplasm of digestive organs: Secondary | ICD-10-CM | POA: Diagnosis not present

## 2017-03-20 DIAGNOSIS — Z1212 Encounter for screening for malignant neoplasm of rectum: Secondary | ICD-10-CM | POA: Diagnosis not present

## 2017-03-20 DIAGNOSIS — Z1211 Encounter for screening for malignant neoplasm of colon: Secondary | ICD-10-CM

## 2017-03-20 MED ORDER — SODIUM CHLORIDE 0.9 % IV SOLN: 500.0000 mL | INTRAVENOUS | Status: DC

## 2017-03-20 NOTE — Progress Notes (Signed)
Report given to PACU, vss 

## 2017-03-20 NOTE — Patient Instructions (Signed)
YOU HAD AN ENDOSCOPIC PROCEDURE TODAY AT [facility]:   Refer to the procedure report that was given to you for any specific questions about what was found during the examination.  If the procedure report does not answer your questions, please call your gastroenterologist to clarify.  If you requested that your care partner not be given the details of your procedure findings, then the procedure report has been included in a sealed envelope for you to review at your convenience later.  YOU SHOULD EXPECT: Some feelings of bloating in the abdomen. Passage of more gas than usual.  Walking can help get rid of the air that was put into your GI tract during the procedure and reduce the bloating. If you had a lower endoscopy (such as a colonoscopy or flexible sigmoidoscopy) you may notice spotting of blood in your stool or on the toilet paper. If you underwent a bowel prep for your procedure, you may not have a normal bowel movement for a few days.  Please Note:  You might notice some irritation and congestion in your nose or some drainage.  This is from the oxygen used during your procedure.  There is no need for concern and it should clear up in a day or so.  SYMPTOMS TO REPORT IMMEDIATELY:   Following lower endoscopy (colonoscopy or flexible sigmoidoscopy):  Excessive amounts of blood in the stool  Significant tenderness or worsening of abdominal pains  Swelling of the abdomen that is new, acute  Fever of 100F or higher   For urgent or emergent issues, a gastroenterologist can be reached at any hour by calling [phone].   DIET:  We do recommend a small meal at first, but then you may proceed to your regular diet.  Drink plenty of fluids but you should avoid alcoholic beverages for 24 hours. Try to increase the fiber in your diet, and drink plenty of water.  ACTIVITY:  You should plan to take it easy for the rest of today and you should NOT DRIVE or use heavy machinery until  tomorrow (because of the sedation medicines used during the test).    FOLLOW UP: Our staff will call the number listed on your records the next business day following your procedure to check on you and address any questions or concerns that you may have regarding the information given to you following your procedure. If we do not reach you, we will leave a message.  However, if you are feeling well and you are not experiencing any problems, there is no need to return our call.  We will assume that you have returned to your regular daily activities without incident.  If any biopsies were taken you will be contacted by phone or by letter within the next 1-3 weeks.  Please call us at [phone] if you have not heard about the biopsies in 3 weeks.    SIGNATURES/CONFIDENTIALITY: You and/or your care partner have signed paperwork which will be entered into your electronic medical record.  These signatures attest to the fact that that the information above on your After Visit Summary has been reviewed and is understood.  Full responsibility of the confidentiality of this discharge information lies with you and/or your care-partner.  Read all of the handouts given to you by your recovery room nurse.

## 2017-03-20 NOTE — Progress Notes (Signed)
Pt's states no medical or surgical changes since previsit or office visit. 

## 2017-03-20 NOTE — Op Note (Signed)
[facility] Patient Name: Anna Valentine Procedure Date: 03/20/2017 8:54 AM MRN: [id number] Endoscopist: Remo Lipps P. Armbruster MD, MD Age: 66 Referring MD:  Date of Birth: August 07, 1950 Gender: Female Account #: [id number] Procedure:                Colonoscopy Indications:              Screening patient at increased risk: Family history                            of 1st-degree relative with colorectal cancer                            (father diagnosed age 49s) Medicines:                Monitored Anesthesia Care Procedure:                Pre-Anesthesia Assessment:                           - Prior to the procedure, a History and Physical                            was performed, and patient medications and                            allergies were reviewed. The patient's tolerance of                            previous anesthesia was also reviewed. The risks                            and benefits of the procedure and the sedation                            options and risks were discussed with the patient.                            All questions were answered, and informed consent                            was obtained. Prior Anticoagulants: The patient has                            taken no previous anticoagulant or antiplatelet                            agents. ASA Grade Assessment: II - A patient with                            mild systemic disease. After reviewing the risks                            and benefits, the patient was deemed in  satisfactory condition to undergo the procedure.                           After obtaining informed consent, the colonoscope                            was passed under direct vision. Throughout the                            procedure, the patient's blood pressure, pulse, and                            oxygen saturations were monitored continuously. The                            Colonoscope was introduced  through the anus and                            advanced to the the cecum, identified by                            appendiceal orifice and ileocecal valve. The                            colonoscopy was performed without difficulty. The                            patient tolerated the procedure well. The quality                            of the bowel preparation was good. The ileocecal                            valve, appendiceal orifice, and rectum were                            photographed. Scope In: 8:59:05 AM Scope Out: 9:18:15 AM Scope Withdrawal Time: 0 hours 15 minutes 30 seconds  Total Procedure Duration: 0 hours 19 minutes 10 seconds  Findings:                 The perianal and digital rectal examinations were                            normal.                           A single small angiodysplastic lesion was found in                            the cecum.                           A single medium-mouthed diverticulum was found in  the ascending colon.                           Internal hemorrhoids were found during retroflexion.                           Anal papilla(e) were hypertrophied.                           The exam was otherwise without abnormality. No                            polyps. Complications:            No immediate complications. Estimated blood loss:                            None. Estimated Blood Loss:     Estimated blood loss: none. Impression:               - A single colonic angiodysplastic lesion.                           - Diverticulosis in the ascending colon.                           - Internal hemorrhoids.                           - Anal papilla(e) were hypertrophied.                           - The examination was otherwise normal.                           - No specimens collected. Recommendation:           - Patient has a contact number available for                            emergencies. The signs and  symptoms of potential                            delayed complications were discussed with the                            patient. Return to normal activities tomorrow.                            Written discharge instructions were provided to the                            patient.                           - Resume previous diet.                           - Continue present medications.                           -  Repeat colonoscopy in 10 years for screening                            purposes. Remo Lipps P. Armbruster MD, MD 03/20/2017 9:22:37 AM This report has been signed electronically.

## 2017-03-21 ENCOUNTER — Telehealth: Payer: Self-pay

## 2017-03-21 NOTE — Telephone Encounter (Signed)
  Follow up Call-  Call back number 03/20/2017  Post procedure Call Back phone  # [phone]  Permission to leave phone message Yes  Some recent data might be hidden     Patient questions:  Do you have a fever, pain , or abdominal swelling? No. Pain Score  0 *  Have you tolerated food without any problems? Yes.    Have you been able to return to your normal activities? Yes.    Do you have any questions about your discharge instructions: Diet   No. Medications  No. Follow up visit  No.  Do you have questions or concerns about your Care? No.  Actions: * If pain score is 4 or above: No action needed, pain <4.

## 2017-03-21 NOTE — Telephone Encounter (Signed)
  Follow up Call-  Call back number 03/20/2017  Post procedure Call Back phone  # [phone]  Permission to leave phone message Yes  Some recent data might be hidden     Patient questions:  Do you have a fever, pain , or abdominal swelling? No. Pain Score  0 *  Have you tolerated food without any problems? Yes.    Have you been able to return to your normal activities? Yes.    Do you have any questions about your discharge instructions: Diet   No. Medications  No. Follow up visit  No.  Do you have questions or concerns about your Care? No.  Actions: * If pain score is 4 or above: No action needed, pain <4.

## 2017-04-18 ENCOUNTER — Other Ambulatory Visit: Payer: Self-pay | Admitting: Internal Medicine

## 2017-04-20 ENCOUNTER — Ambulatory Visit: Payer: Medicare Other | Admitting: Internal Medicine

## 2017-04-21 ENCOUNTER — Ambulatory Visit (INDEPENDENT_AMBULATORY_CARE_PROVIDER_SITE_OTHER): Payer: Medicare Other | Admitting: Internal Medicine

## 2017-04-21 DIAGNOSIS — Z23 Encounter for immunization: Secondary | ICD-10-CM

## 2017-04-21 NOTE — Progress Notes (Signed)
Flu vaccine given.

## 2017-04-21 NOTE — Patient Instructions (Addendum)
Flu vaccine given.

## 2017-06-06 DIAGNOSIS — Z902 Acquired absence of lung [part of]: Secondary | ICD-10-CM | POA: Diagnosis not present

## 2017-06-06 DIAGNOSIS — Z08 Encounter for follow-up examination after completed treatment for malignant neoplasm: Secondary | ICD-10-CM | POA: Diagnosis not present

## 2017-06-06 DIAGNOSIS — J479 Bronchiectasis, uncomplicated: Secondary | ICD-10-CM | POA: Diagnosis not present

## 2017-06-06 DIAGNOSIS — Z85118 Personal history of other malignant neoplasm of bronchus and lung: Secondary | ICD-10-CM | POA: Diagnosis not present

## 2017-06-06 DIAGNOSIS — Z87891 Personal history of nicotine dependence: Secondary | ICD-10-CM | POA: Diagnosis not present

## 2017-06-06 DIAGNOSIS — C3431 Malignant neoplasm of lower lobe, right bronchus or lung: Secondary | ICD-10-CM | POA: Diagnosis not present

## 2017-06-06 DIAGNOSIS — R918 Other nonspecific abnormal finding of lung field: Secondary | ICD-10-CM | POA: Diagnosis not present

## 2017-06-14 ENCOUNTER — Encounter: Payer: Self-pay | Admitting: Emergency Medicine

## 2017-06-14 ENCOUNTER — Ambulatory Visit (INDEPENDENT_AMBULATORY_CARE_PROVIDER_SITE_OTHER): Payer: Medicare Other | Admitting: Emergency Medicine

## 2017-06-14 VITALS — BP 120/86 | HR 87 | Ht 66.0 in | Wt 121.0 lb

## 2017-06-14 DIAGNOSIS — Z23 Encounter for immunization: Secondary | ICD-10-CM | POA: Diagnosis not present

## 2017-06-14 DIAGNOSIS — J479 Bronchiectasis, uncomplicated: Secondary | ICD-10-CM | POA: Diagnosis not present

## 2017-06-14 MED ORDER — PNEUMOCOCCAL VAC POLYVALENT 25 MCG/0.5ML IJ INJ: 0.5000 mL | INJECTION | INTRAMUSCULAR | Status: DC

## 2017-06-14 MED ORDER — PNEUMOCOCCAL VAC POLYVALENT 25 MCG/0.5ML IJ INJ
0.5000 mL | INJECTION | INTRAMUSCULAR | Status: AC
  Administered 2017-06-14: 0.5 mL via INTRAMUSCULAR

## 2017-06-14 NOTE — Assessment & Plan Note (Signed)
Bronchiectasis that appears to be stable by repeat CT scan done at [facility].  There is some waxing and waning scattered micronodular disease that I suspect is related to chronic mycobacterial disease although this is never been improved.  Given her clinical stability and radiographical stability I do not believe she needs to have bronchoscopy or cultures performed right now.  If she changes clinically, begins to exacerbate frequently or if her bronchiectasis progresses then certainly we would want culture information.  She has a repeat CT scan annually at [facility] is to follow her non-small cell lung cancer.  We will get copies of these films and see her in 1 year.  If she changes clinically then we will see her sooner.  She is due for the Pneumovax now that she is age 66.  We will plan to give her Prevnar 13 next year.

## 2017-06-14 NOTE — Patient Instructions (Signed)
Please get your annual CT scan as planned at [facility] in November 2019 Pneumovax today Flu shot up-to-date Please let us know if you experience an increase in the frequency of her cough, increased mucus, a change in the color of your mucus.  Follow with Dr Lamonte Sakai in December 2019 or sooner if you have any problems.

## 2017-06-14 NOTE — Progress Notes (Signed)
Subjective:    Patient ID: Anna Valentine, female    DOB: 1950-10-29, 66 y.o.   MRN: [id number]  HPI 66 year old former smoker with a history of non-small cell lung cancer status post lobectomy and right upper and middle lobe wedge resections. She also has a history of breast cancer post lumpectomy. 's been followed with serial CT scans of the chest at [facility] read on a scan 01/26/16 she was found to have a right upper lobe posterior soft tissue component with associated bronchiectasis and airway distortion, an inferior ground glass area. This prompted PET scans 02/04/16 and 8/28/17that I have reviewed showed decrease in size of an ill-defined hypermetabolic groundglass opacity and centrilobular nodularity in the lateral right upper lobe. There was a stable irregular soft tissue lesion in the RUL that was not hypermetabolic. Only a repeat CT scan of the chest on 06/08/16 shows continued bronchiectasis, increase in mucous plugging and right lower lobe consolidation. Stable left apical GGI as well. She has felt well. She does occasionally cough, can bring up green phlegm. Does not cough every day. She has experienced a single episode of hemoptysis in 11/2015.    ROV 11/16/16 -- This follow-up visit for patient with a history of lung cancer status post wedge resections, bronchiectasis and suspected mycobacterial colonization although this has not been established. She reports that she has been doing well. Minimal mucous production.   ROV 11/30/16 -- This is a follow-up visit for patient with a history of bronchiectasis and possible chronic mycobacterial colonization (not proven). She also has a history of non-small cell lung cancers that have been treated with wedge resections in the past. We have been tracking some irregular soft tissue densities by CT scan and PET scan as detailed above. Most recent CT scan was 06/08/16. She underwent a repeat scan on 11/25/16 that I have personally reviewed. This shows  post surgical changes, widespread cylindrical and varicose bronchiectasis more on the right than on the left. Also some continued apical and peripheral pleural-based opacity that is stable compared with her prior examination.   ROV 06/14/17 --66 year old woman with bronchiectasis and suspected chronic mycobacterial colonization (culture negative), history of non-small cell lung cancer that has been treated with wedge resection.  Following apical and peripheral pleural-based opacities by serial CT scan of the chest.  Most recent scan was done 06/06/17 that I have reviewed.  This shows continued extensive bronchiectasis with associated mucous plugging, some shifting areas of groundglass opacity, a new somewhat oblong and linear opacity in the lingula that looks consistent with mucoid impaction. She recalls having Pneumovax in the past x 1.    Review of Systems  Constitutional: Negative.  Negative for fever and unexpected weight change.  HENT: Negative.  Negative for congestion, dental problem, ear pain, nosebleeds, postnasal drip, rhinorrhea, sinus pressure, sneezing, sore throat and trouble swallowing.   Eyes: Negative.  Negative for redness and itching.  Respiratory: Negative.  Negative for cough, chest tightness, shortness of breath and wheezing.   Cardiovascular: Negative.  Negative for palpitations and leg swelling.  Gastrointestinal: Negative.  Negative for nausea and vomiting.  Genitourinary: Negative.  Negative for dysuria.  Musculoskeletal: Negative.  Negative for joint swelling.  Skin: Negative.  Negative for rash.  Neurological: Negative.  Negative for headaches.  Hematological: Negative.  Does not bruise/bleed easily.  Psychiatric/Behavioral: Negative.  Negative for dysphoric mood. The patient is not nervous/anxious.    Past Medical History:  Diagnosis Date  . Anemia   . BRCA  negative 2011   BRCA I/ II negative  . Breast cancer ([place]) 08/2009   stage 2, rx with lumpectomy and xrt  .  COPD (chronic obstructive pulmonary disease) (HCC)    pt.unsure of diagnosis status  . Emphysema of lung ([place])    pt. questions diagnosis  . GERD (gastroesophageal reflux disease)    not presently having symptoms  . Lung cancer ([place]) 06/06/05   stage 1 poorly differentiated adenocarcinoma, s/p right lower lobectomy.  . Osteoporosis   . STD (sexually transmitted disease)    HSV     Family History  Problem Relation Age of Onset  . Allergies Mother   . Asthma Mother   . Lung cancer Mother   . Breast cancer Mother 77       Recurrance age 66  . Colon cancer Father   . Prostate cancer Brother   . Breast cancer Sister 57       Recurrence age 67 BRCA negative  . Colon polyps Sister   . Breast cancer Sister 68  . Breast cancer Maternal Grandmother 18  . Esophageal cancer Neg Hx   . Rectal cancer Neg Hx   . Stomach cancer Neg Hx      Social History   Socioeconomic History  . Marital status: Married    Spouse name: Not on file  . Number of children: 0  . Years of education: Not on file  . Highest education level: Not on file  Social Needs  . Financial resource strain: Not on file  . Food insecurity - worry: Not on file  . Food insecurity - inability: Not on file  . Transportation needs - medical: Not on file  . Transportation needs - non-medical: Not on file  Occupational History  . Occupation: OFFICE MANAGER    Employer: [employer]  Tobacco Use  . Smoking status: Former Smoker    Packs/day: 2.00    Years: 18.00    Pack years: 36.00    Last attempt to quit: 07/12/1987    Years since quitting: 29.9  . Smokeless tobacco: Never Used  Substance and Sexual Activity  . Alcohol use: Yes    Alcohol/week: 2.0 oz    Types: 4 Standard drinks or equivalent per week    Comment: 2 per day  . Drug use: No  . Sexual activity: Yes  Other Topics Concern  . Not on file  Social History Narrative  . Not on file     Allergies  Allergen Reactions  . Clarithromycin      REACTION: rash     Outpatient Medications Prior to Visit  Medication Sig Dispense Refill  . acyclovir (ZOVIRAX) 400 MG tablet TAKE 1 TABLET BY MOUTH TWICE A DAY 90 tablet 2  . aspirin 81 MG chewable tablet Chew by mouth.    . Calcium Carbonate (CALCIUM 600 PO) Take 600 mg by mouth daily.      . cholecalciferol (VITAMIN D) 1000 UNITS tablet Take 1,000 Units by mouth daily.    . Psyllium (METAMUCIL PO) Take by mouth daily.      . vitamin C (ASCORBIC ACID) 500 MG tablet Take 500 mg by mouth daily.     No facility-administered medications prior to visit.         Objective:   Physical Exam Vitals:   06/14/17 1329  BP: 120/86  Pulse: 87  SpO2: 98%  Weight: 121 lb (54.9 kg)  Height: _0  (1.676 m)  Gen: Pleasant, thin woman, well-nourished, in  no distress,  normal affect  ENT: No lesions,  mouth clear,  oropharynx clear, no postnasal drip  Neck: No JVD, no TMG, no carotid bruits  Lungs: No use of accessory muscles, clear without rales or rhonchi  Cardiovascular: RRR, heart sounds normal, no murmur or gallops, no peripheral edema  Musculoskeletal: No deformities, no cyanosis or clubbing  Neuro: alert, non focal  Skin: Warm, no lesions or rashes       Assessment & Plan:  Bronchiectasis without acute exacerbation (HCC) Bronchiectasis that appears to be stable by repeat CT scan done at [facility].  There is some waxing and waning scattered micronodular disease that I suspect is related to chronic mycobacterial disease although this is never been improved.  Given her clinical stability and radiographical stability I do not believe she needs to have bronchoscopy or cultures performed right now.  If she changes clinically, begins to exacerbate frequently or if her bronchiectasis progresses then certainly we would want culture information.  She has a repeat CT scan annually at [facility] is to follow her non-small cell lung cancer.  We will get copies of these films and see her in 1 year.  If  she changes clinically then we will see her sooner.  She is due for the Pneumovax now that she is age 62.  We will plan to give her Prevnar 13 next year.  Baltazar Apo, MD, PhD 06/14/2017, 2:07 PM [facility] [phone] or if no answer [phone]

## 2017-06-14 NOTE — Addendum Note (Signed)
Addended by: Lorretta Harp on: 06/14/2017 02:16 PM   Modules accepted: Orders

## 2017-07-05 DIAGNOSIS — H35371 Puckering of macula, right eye: Secondary | ICD-10-CM | POA: Diagnosis not present

## 2017-07-05 DIAGNOSIS — H40013 Open angle with borderline findings, low risk, bilateral: Secondary | ICD-10-CM | POA: Diagnosis not present

## 2017-07-05 DIAGNOSIS — H40053 Ocular hypertension, bilateral: Secondary | ICD-10-CM | POA: Diagnosis not present

## 2017-07-05 DIAGNOSIS — H35341 Macular cyst, hole, or pseudohole, right eye: Secondary | ICD-10-CM | POA: Diagnosis not present

## 2017-07-12 DIAGNOSIS — H33101 Unspecified retinoschisis, right eye: Secondary | ICD-10-CM | POA: Diagnosis not present

## 2017-07-12 DIAGNOSIS — H2512 Age-related nuclear cataract, left eye: Secondary | ICD-10-CM | POA: Diagnosis not present

## 2017-07-12 DIAGNOSIS — H2511 Age-related nuclear cataract, right eye: Secondary | ICD-10-CM | POA: Diagnosis not present

## 2017-07-12 DIAGNOSIS — H35371 Puckering of macula, right eye: Secondary | ICD-10-CM | POA: Diagnosis not present

## 2017-07-25 DIAGNOSIS — L821 Other seborrheic keratosis: Secondary | ICD-10-CM | POA: Diagnosis not present

## 2017-07-25 DIAGNOSIS — L720 Epidermal cyst: Secondary | ICD-10-CM | POA: Diagnosis not present

## 2017-07-25 DIAGNOSIS — L820 Inflamed seborrheic keratosis: Secondary | ICD-10-CM | POA: Diagnosis not present

## 2017-08-18 ENCOUNTER — Other Ambulatory Visit: Payer: Self-pay | Admitting: Oncology

## 2017-08-18 DIAGNOSIS — Z1231 Encounter for screening mammogram for malignant neoplasm of breast: Secondary | ICD-10-CM

## 2017-08-31 ENCOUNTER — Other Ambulatory Visit: Payer: Self-pay | Admitting: Internal Medicine

## 2017-08-31 NOTE — Telephone Encounter (Signed)
Please call pt. Last CPE 2016. Needs one this Spring

## 2017-09-13 ENCOUNTER — Other Ambulatory Visit: Payer: Self-pay

## 2017-09-13 MED ORDER — ACYCLOVIR 400 MG PO TABS: 400.0000 mg | ORAL_TABLET | Freq: Two times a day (BID) | ORAL | 0 refills | Status: DC

## 2017-09-27 ENCOUNTER — Ambulatory Visit
Admission: RE | Admit: 2017-09-27 | Discharge: 2017-09-27 | Disposition: A | Discharge status: Home / Self Care | Payer: Medicare Other | Source: Ambulatory Visit | Attending: Oncology | Admitting: Oncology

## 2017-09-27 DIAGNOSIS — Z1231 Encounter for screening mammogram for malignant neoplasm of breast: Secondary | ICD-10-CM

## 2017-10-18 ENCOUNTER — Other Ambulatory Visit: Payer: Self-pay

## 2017-10-18 DIAGNOSIS — Z13220 Encounter for screening for lipoid disorders: Secondary | ICD-10-CM

## 2017-10-18 DIAGNOSIS — M8580 Other specified disorders of bone density and structure, unspecified site: Secondary | ICD-10-CM

## 2017-10-18 DIAGNOSIS — Z85118 Personal history of other malignant neoplasm of bronchus and lung: Secondary | ICD-10-CM

## 2017-10-18 DIAGNOSIS — Z1321 Encounter for screening for nutritional disorder: Secondary | ICD-10-CM

## 2017-10-18 DIAGNOSIS — Z1329 Encounter for screening for other suspected endocrine disorder: Secondary | ICD-10-CM

## 2017-10-18 DIAGNOSIS — Z0000 Encounter for general adult medical examination without abnormal findings: Secondary | ICD-10-CM

## 2017-10-21 ENCOUNTER — Other Ambulatory Visit: Payer: Self-pay | Admitting: Internal Medicine

## 2017-11-07 ENCOUNTER — Other Ambulatory Visit: Payer: Medicare Other | Admitting: Internal Medicine

## 2017-11-07 DIAGNOSIS — Z0000 Encounter for general adult medical examination without abnormal findings: Secondary | ICD-10-CM

## 2017-11-07 DIAGNOSIS — M8580 Other specified disorders of bone density and structure, unspecified site: Secondary | ICD-10-CM | POA: Diagnosis not present

## 2017-11-07 DIAGNOSIS — J479 Bronchiectasis, uncomplicated: Secondary | ICD-10-CM | POA: Diagnosis not present

## 2017-11-07 DIAGNOSIS — R5383 Other fatigue: Secondary | ICD-10-CM | POA: Diagnosis not present

## 2017-11-07 DIAGNOSIS — Z1329 Encounter for screening for other suspected endocrine disorder: Secondary | ICD-10-CM | POA: Diagnosis not present

## 2017-11-07 DIAGNOSIS — Z85118 Personal history of other malignant neoplasm of bronchus and lung: Secondary | ICD-10-CM | POA: Diagnosis not present

## 2017-11-07 DIAGNOSIS — E7800 Pure hypercholesterolemia, unspecified: Secondary | ICD-10-CM | POA: Diagnosis not present

## 2017-11-07 DIAGNOSIS — Z13220 Encounter for screening for lipoid disorders: Secondary | ICD-10-CM | POA: Diagnosis not present

## 2017-11-07 DIAGNOSIS — F411 Generalized anxiety disorder: Secondary | ICD-10-CM | POA: Diagnosis not present

## 2017-11-07 LAB — COMPLETE METABOLIC PANEL WITH GFR
AG Ratio: 1.4 (calc) (ref 1.0–2.5)
ALBUMIN MSPROF: 4.2 g/dL (ref 3.6–5.1)
ALKALINE PHOSPHATASE (APISO): 82 U/L (ref 33–130)
ALT: 8 U/L (ref 6–29)
AST: 18 U/L (ref 10–35)
BUN: 18 mg/dL (ref 7–25)
CALCIUM: 9.4 mg/dL (ref 8.6–10.4)
CO2: 30 mmol/L (ref 20–32)
CREATININE: 0.64 mg/dL (ref 0.50–0.99)
Chloride: 103 mmol/L (ref 98–110)
GFR, EST NON AFRICAN AMERICAN: 93 mL/min/{1.73_m2} (ref >=60)
GFR, Est African American: 108 mL/min/{1.73_m2} (ref >=60)
GLUCOSE: 89 mg/dL (ref 65–99)
Globulin: 2.9 g/dL (calc) (ref 1.9–3.7)
Potassium: 4.8 mmol/L (ref 3.5–5.3)
Sodium: 141 mmol/L (ref 135–146)
Total Bilirubin: 0.5 mg/dL (ref 0.2–1.2)
Total Protein: 7.1 g/dL (ref 6.1–8.1)

## 2017-11-07 LAB — CBC WITH DIFFERENTIAL/PLATELET
BASOS PCT: 0.5 %
Basophils Absolute: 19 cells/uL (ref 0–200)
Eosinophils Absolute: 70 cells/uL (ref 15–500)
Eosinophils Relative: 1.9 %
HEMATOCRIT: 37.1 % (ref 35.0–45.0)
Hemoglobin: 12.6 g/dL (ref 11.7–15.5)
LYMPHS ABS: 833 {cells}/uL — AB (ref 850–3900)
MCH: 31.7 pg (ref 27.0–33.0)
MCHC: 34 g/dL (ref 32.0–36.0)
MCV: 93.2 fL (ref 80.0–100.0)
MPV: 9.6 fL (ref 7.5–12.5)
Monocytes Relative: 9.6 %
NEUTROS PCT: 65.5 %
Neutro Abs: 2424 cells/uL (ref 1500–7800)
Platelets: 304 10*3/uL (ref 140–400)
RBC: 3.98 10*6/uL (ref 3.80–5.10)
RDW: 11.7 % (ref 11.0–15.0)
Total Lymphocyte: 22.5 %
WBC: 3.7 10*3/uL — AB (ref 3.8–10.8)
WBCMIX: 355 {cells}/uL (ref 200–950)

## 2017-11-07 LAB — LIPID PANEL
Cholesterol: 213 mg/dL — ABNORMAL HIGH (ref <200)
HDL: 73 mg/dL (ref >50)
LDL CHOLESTEROL (CALC): 125 mg/dL — AB
Non-HDL Cholesterol (Calc): 140 mg/dL (calc) — ABNORMAL HIGH (ref <130)
Total CHOL/HDL Ratio: 2.9 (calc) (ref <5.0)
Triglycerides: 56 mg/dL (ref <150)

## 2017-11-07 LAB — TSH: TSH: 1.8 m[IU]/L (ref 0.40–4.50)

## 2017-11-14 ENCOUNTER — Encounter: Payer: Self-pay | Admitting: Internal Medicine

## 2017-11-14 ENCOUNTER — Ambulatory Visit (INDEPENDENT_AMBULATORY_CARE_PROVIDER_SITE_OTHER): Payer: Medicare Other | Admitting: Internal Medicine

## 2017-11-14 VITALS — BP 150/90 | HR 88 | Wt 122.0 lb

## 2017-11-14 DIAGNOSIS — M8580 Other specified disorders of bone density and structure, unspecified site: Secondary | ICD-10-CM

## 2017-11-14 DIAGNOSIS — Z853 Personal history of malignant neoplasm of breast: Secondary | ICD-10-CM | POA: Diagnosis not present

## 2017-11-14 DIAGNOSIS — Z85118 Personal history of other malignant neoplasm of bronchus and lung: Secondary | ICD-10-CM

## 2017-11-14 DIAGNOSIS — E7800 Pure hypercholesterolemia, unspecified: Secondary | ICD-10-CM | POA: Diagnosis not present

## 2017-11-14 DIAGNOSIS — R079 Chest pain, unspecified: Secondary | ICD-10-CM | POA: Diagnosis not present

## 2017-11-14 DIAGNOSIS — Z1159 Encounter for screening for other viral diseases: Secondary | ICD-10-CM

## 2017-11-14 DIAGNOSIS — Z0000 Encounter for general adult medical examination without abnormal findings: Secondary | ICD-10-CM | POA: Diagnosis not present

## 2017-11-15 LAB — MEASLES/MUMPS/RUBELLA IMMUNITY
MUMPS IGG: 297 [AU]/ml
Rubella: 13.4 index

## 2017-11-28 ENCOUNTER — Encounter: Payer: Self-pay | Admitting: Cardiology

## 2017-11-28 ENCOUNTER — Encounter: Payer: Self-pay | Admitting: *Deleted

## 2017-11-28 ENCOUNTER — Ambulatory Visit (INDEPENDENT_AMBULATORY_CARE_PROVIDER_SITE_OTHER): Payer: Medicare Other | Admitting: Cardiology

## 2017-11-28 DIAGNOSIS — R079 Chest pain, unspecified: Secondary | ICD-10-CM | POA: Diagnosis not present

## 2017-11-28 NOTE — Patient Instructions (Signed)
Medication Instructions:  Your physician recommends that you continue on your current medications as directed. Please refer to the Current Medication list given to you today.  If you need a refill on your cardiac medications, please contact your pharmacy first.  Labwork: None ordered   Testing/Procedures: Your physician has requested that you have a lexiscan myoview. For further information please visit [email or web address]. Please follow instruction sheet, as given  Follow-Up: Your physician wants you to follow-up AS NEEDED with Dr. Radford Pax   Any Other Special Instructions Will Be Listed Below (If Applicable).   Thank you for choosing [place], RN  [phone]  If you need a refill on your cardiac medications before your next appointment, please call your pharmacy.

## 2017-11-28 NOTE — Progress Notes (Signed)
Cardiology Office Note    Date:  11/28/2017   ID:  Anna Valentine, DOB [birth year], MRN [id number]  PCP:  [name], MD  Cardiologist:  Fransico Him, MD   Chief Complaint  Patient presents with  . Chest Pain    History of Present Illness:  Anna Valentine is a 67 y.o. female who is being seen today for the evaluation of chest pain at the request of Baxley, Cresenciano Lick, MD.  This is a very pleasant 67 year old female with a history of COPD, GERD, stage I lung CA status post right lower lobe lobectomy has been having intermittent chest pain is now referred for further evaluation.  She had an EKG done on 11/15/2007 which showed normal sinus rhythm with no ST changes.  She has remote history of tobacco use but has no family history of coronary disease.  She states she saw her PCP the other week for routine physical and mentioned that she had an episode of chest pain.  She states she was at a [employer] study and was talking for a while and got very nervous while she was talking and all of a sudden felt warm all over and then got severe pressure across her chest with radiation to her arms bilaterally and up into her jaws.  This eventually subsided.  Several friends took her blood pressure which was okay.  She says she is actually think she had a panic attack.  Has had no other episodes since then.  She used to be an avid runner before she had her lung cancer and lobectomy.  She still though stays very active.  Past Medical History:  Diagnosis Date  . Anemia   . BRCA negative 2011   BRCA I/ II negative  . Breast cancer ([place]) 08/2009   stage 2, rx with lumpectomy and xrt  . COPD (chronic obstructive pulmonary disease) ([facility])    pt.unsure of diagnosis status  . Emphysema of lung ([place])    pt. questions diagnosis  . GERD (gastroesophageal reflux disease)    not presently having symptoms  . Lung cancer ([facility]) 06/06/05   stage 1 poorly differentiated adenocarcinoma, s/p right lower  lobectomy.  . Osteoporosis   . STD (sexually transmitted disease)    HSV    Past Surgical History:  Procedure Laterality Date  . BREAST LUMPECTOMY  10/12/2009   Left lumpectomy and radiation, stage II, ER/PR+, Her 2 nu negative  . BUNIONECTOMY    . COLONOSCOPY    . LOBECTOMY  06/06/2005   Right  . TONSILLECTOMY    . TUBAL LIGATION  1984    Current Medications: Current Meds  Medication Sig  . acyclovir (ZOVIRAX) 400 MG tablet TAKE 1 TABLET BY MOUTH TWICE A DAY  . aspirin 81 MG chewable tablet Chew by mouth.  . Calcium Carbonate (CALCIUM 600 PO) Take 600 mg by mouth daily.    . cholecalciferol (VITAMIN D) 1000 UNITS tablet Take 1,000 Units by mouth daily.  . Psyllium (METAMUCIL PO) Take by mouth daily.    . vitamin C (ASCORBIC ACID) 500 MG tablet Take 500 mg by mouth daily.    Allergies:   Clarithromycin   Social History   Socioeconomic History  . Marital status: Married    Spouse name: Not on file  . Number of children: 0  . Years of education: Not on file  . Highest education level: Not on file  Occupational History  . Occupation: OFFICE MANAGER  Employer: [employer]  . Financial resource strain: Not on file  . Food insecurity:    Worry: Not on file    Inability: Not on file  . Transportation needs:    Medical: Not on file    Non-medical: Not on file  Tobacco Use  . Smoking status: Former Smoker    Packs/day: 2.00    Years: 18.00    Pack years: 36.00    Last attempt to quit: 07/12/1987    Years since quitting: 30.4  . Smokeless tobacco: Never Used  Substance and Sexual Activity  . Alcohol use: Yes    Alcohol/week: 2.0 oz    Types: 4 Standard drinks or equivalent per week    Comment: 2 per day  . Drug use: No  . Sexual activity: Yes  Lifestyle  . Physical activity:    Days per week: Not on file    Minutes per session: Not on file  . Stress: Not on file  Relationships  . Social connections:    Talks on phone: Not on file    Gets  together: Not on file    Attends religious service: Not on file    Active member of club or organization: Not on file    Attends meetings of clubs or organizations: Not on file    Relationship status: Not on file  Other Topics Concern  . Not on file  Social History Narrative  . Not on file     Family History:  The patient's family history includes Allergies in her mother; Asthma in her mother; Breast cancer (age of onset: 66) in her mother, sister, and sister; Breast cancer (age of onset: 64) in her maternal grandmother; Colon cancer in her father; Colon polyps in her sister; Lung cancer in her mother; Prostate cancer in her brother.   ROS:   Please see the history of present illness.    ROS All other systems reviewed and are negative.  No flowsheet data found.     PHYSICAL EXAM:   VS:  BP (!) 162/90   Pulse 90   Ht '5\' 6"'$  (1.676 m)   Wt 120 lb (54.4 kg)   SpO2 98%   BMI 19.37 kg/m    GEN: Well nourished, well developed, in no acute distress  HEENT: normal  Neck: no JVD, carotid bruits, or masses Cardiac: RRR; no murmurs, rubs, or gallops,no edema.  Intact distal pulses bilaterally.  Respiratory:  clear to auscultation bilaterally, normal work of breathing GI: soft, nontender, nondistended, + BS MS: no deformity or atrophy  Skin: warm and dry, no rash Neuro:  Alert and Oriented x 3, Strength and sensation are intact Psych: euthymic mood, full affect  Wt Readings from Last 3 Encounters:  11/28/17 120 lb (54.4 kg)  11/14/17 122 lb (55.3 kg)  06/14/17 121 lb (54.9 kg)      Studies/Labs Reviewed:   EKG:  EKG is not ordered today.    Recent Labs: 11/07/2017: ALT 8; BUN 18; Creat 0.64; Hemoglobin 12.6; Platelets 304; Potassium 4.8; Sodium 141; TSH 1.80   Lipid Panel    Component Value Date/Time   CHOL 213 (H) 11/07/2017 0906   TRIG 56 11/07/2017 0906   HDL 73 11/07/2017 0906   CHOLHDL 2.9 11/07/2017 0906   VLDL 10 09/29/2014 0923   LDLCALC 125 (H) 11/07/2017  0906    Additional studies/ records that were reviewed today include:   Office notes from Dr. Renold Genta and EKG   ASSESSMENT:  1. Chest pain, unspecified type      PLAN:  In order of problems listed above:  1. Chest pain - chest pain occurred in the setting of an easy situation and currently related to a panic attack but has some of her symptoms including the pressure that radiated to her arms and her jaws is very concerning for underlying coronary ischemia.  She clearly has cardiac risk factors including history of tobacco use.  I recommended that we get a Lexiscan Myoview to rule out ischemia.  She says she does not think she can run on a treadmill.  EKG done in PCPs office was normal.    Medication Adjustments/Labs and Tests Ordered: Current medicines are reviewed at length with the patient today.  Concerns regarding medicines are outlined above.  Medication changes, Labs and Tests ordered today are listed in the Patient Instructions below.  There are no Patient Instructions on file for this visit.   Signed, Fransico Him, MD  11/28/2017 4:35 PM    [address], [place], [place]  [address] Phone: [phone]; Fax: [phone]

## 2017-12-06 ENCOUNTER — Other Ambulatory Visit: Payer: Self-pay | Admitting: Internal Medicine

## 2017-12-06 NOTE — Telephone Encounter (Signed)
Left detailed message.   

## 2017-12-06 NOTE — Telephone Encounter (Signed)
Patient is calling back regarding her Acyclovir.  States that for years she has taken this medication twice daily as prevention medication.  States that she has tried to cut back to just once daily.  And, then has just tried to stop it, but without fail, when she stops it, she will have an outbreak.  For this reason, she takes it every day, bid.    So, she is just need to get it refilled and she take 1 twice daily.    Pharmacy:  [facility] @ [address] [place])  Phone # [phone]  Thank you.

## 2017-12-06 NOTE — Telephone Encounter (Signed)
Please clarify how she is taking this. Generally for prevention it is one time daily and for outbreaks it is 500 mg twice daily for 5 days. This quantity does not make sense to me.

## 2017-12-07 NOTE — Telephone Encounter (Signed)
Verbal order by Dr. Renold Genta to dispense #90 Acyclovir, 0 Refill Take 1 tablet by mouth twice a day.   Ph[facility]:  [facility]  Dr. Renold Genta e[facility]scribed Rx to [facility] on 5/[facility].

## 2017-12-08 NOTE — Patient Instructions (Addendum)
She will be referred to Cardiology for evaluation of chest pain.  Continue diet and exercise efforts.  Return in 1 year or as needed.

## 2017-12-08 NOTE — Progress Notes (Signed)
Subjective:    Patient ID: Anna Valentine, female    DOB: Mar 29, 1951, 67 y.o.   MRN: [id number]  HPI 67 year old Female in today for Welcome to Medicare physical examination and evaluation of medical issues.  She has a history of breast and lung cancer.  In 2006 she was diagnosed with a 1.2 cm poorly differentiated adenocarcinoma of the lung with negative lymph nodes.  She underwent a right lower lobe wedge resection at [facility].  In February 2011 she was diagnosed with breast cancer and underwent lumpectomy of the left chest with a diagnosis of lobular carcinoma.  Sentinel node biopsy showed 1 out of 4 lymph nodes positive for invasive lobular carcinoma.  She was clinically stage II.  Tumor was ER positive, PR positive, HER-2/neu negative.  Radiation therapy was completed in July 2011.  History of osteopenia.  History of mature cell line neutropenia seen by Dr. Tressie Stalker in 1995.  History of anal fissure 1996.  History of right lower lobe pneumonia December 2007.  Bilateral tubal ligation 1984.  Left bunionectomy 1990.  Right bunionectomy 1992.  Tetanus immunization 2004.  Last colonoscopy 2012.  Has taken Actonel Fosamax and Evista in the past for osteopenia.  Biaxin causes hives.  Social history: She is married.  No children.  She is smoked 1-1/2 to 2 packs of cigarettes daily for some 15 years but quit around 1991.  No alcohol consumption.  He completed 4 years of college.  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  One brother died of prostate cancer.  Father died of colon cancer at age 9.  One brother in good health.  Mother died of metastatic lung cancer at age 35.  There is a family history of breast cancer in her mother and sister.  Interestingly, her mother had double mastectomies in her 7s but in December 1992 was found to have lung cancer presumably a new cancer not related to breast cancer.  Mother developed metastatic disease in the brain and passed away.  She has 1 brother  and 1 sister who have hypertension.  Mother had hypertension.   She will be traveling soon to [place] and wants to have measles mumps and rubella titers done.  Lab work reveals mild elevation of total cholesterol at 213 with an LDL cholesterol of 125.  BUN and creatinine are normal. Fasting glucose is normal.  Review of Systems no significant shortness of breath but recent episode of chest pain that was disturbing to her     Objective:   Physical Exam  Constitutional: She is oriented to person, place, and time. She appears well-developed and well-nourished.  HENT:  Head: Normocephalic.  Right Ear: External ear normal.  Left Ear: External ear normal.  Eyes: Pupils are equal, round, and reactive to light. EOM are normal.  Neck: Neck supple.  Cardiovascular: Normal rate, regular rhythm, normal heart sounds and intact distal pulses.  No murmur heard. Pulmonary/Chest: Effort normal and breath sounds normal. No respiratory distress. She has no wheezes. She has no rales.  Abdominal: Soft. She exhibits no distension and no mass. There is no tenderness. There is no rebound and no guarding.  Neurological: She is oriented to person, place, and time. She displays normal reflexes. No cranial nerve deficit. Coordination normal.  Skin: Skin is dry.  Psychiatric: She has a normal mood and affect. Her behavior is normal. Judgment and thought content normal.          Assessment & Plan:  Recent episode of  chest pain-refer to Cardiology  History of breast cancer  History of lung cancer-followed by pulmonologist  Pure hypercholesterolemia-watch diet  History of osteopenia  History of bronchiectasis  History of HSV type I treated with Zovirax twice daily  Patient requesting measles mumps rubella immunity checked because of travel  Plan: Referral to cardiology and return in 1 year or as needed.  Needs to take vitamin D on a regular basis.  Watch diet and exercise.  Subjective:   Patient  presents for Medicare Annual/Subsequent preventive examination.  Review Past Medical/Family/Social: See above   Risk Factors  Current exercise habits: Years ago she ran a great deal but now tries to walk regularly Dietary issues discussed: Low-fat low carbohydrate  Cardiac risk factors: Hyperlipidemia   Depression Screen  (Note: if answer to either of the following is "Yes", a more complete depression screening is indicated)   Over the past two weeks, have you felt down, depressed or hopeless? No  Over the past two weeks, have you felt little interest or pleasure in doing things? No Have you lost interest or pleasure in daily life? No Do you often feel hopeless? No Do you cry easily over simple problems? No   Activities of Daily Living  In your present state of health, do you have any difficulty performing the following activities?:   Driving? No  Managing money? No  Feeding yourself? No  Getting from bed to chair? No  Climbing a flight of stairs? No  Preparing food and eating?: No  Bathing or showering? No  Getting dressed: No  Getting to the toilet? No  Using the toilet:No  Moving around from place to place: No  In the past year have you fallen or had a near fall?:No  Are you sexually active?  he has Do you have more than one partner? No   Hearing Difficulties: No  Do you often ask people to speak up or repeat themselves? No  Do you experience ringing or noises in your ears? No  Do you have difficulty understanding soft or whispered voices? No  Do you feel that you have a problem with memory? No Do you often misplace items? No    Home Safety:  Do you have a smoke alarm at your residence? Yes Do you have grab bars in the bathroom?  No Do you have throw rugs in your house?  No   Cognitive Testing  Alert? Yes Normal Appearance?Yes  Oriented to person? Yes Place? Yes  Time? Yes  Recall of three objects? Yes  Can perform simple calculations? Yes  Displays  appropriate judgment?Yes  Can read the correct time from a watch face?Yes   List the Names of Other Physician/Practitioners you currently use:  See referral list for the physicians patient is currently seeing.     Review of Systems: See above   Objective:     General appearance: Appears stated age  Head: Normocephalic, without obvious abnormality, atraumatic  Eyes: conj clear, EOMi PEERLA  Ears: normal TM's and external ear canals both ears  Nose: Nares normal. Septum midline. Mucosa normal. No drainage or sinus tenderness.  Throat: lips, mucosa, and tongue normal; teeth and gums normal  Neck: no adenopathy, no carotid bruit, no JVD, supple, symmetrical, trachea midline and thyroid not enlarged, symmetric, no tenderness/mass/nodules  No CVA tenderness.  Lungs: clear to auscultation bilaterally  Breasts: normal appearance, no masses or tenderness Heart: regular rate and rhythm, S1, S2 normal, no murmur, click, rub or gallop  Abdomen:  soft, non-tender; bowel sounds normal; no masses, no organomegaly  Musculoskeletal: ROM normal in all joints, no crepitus, no deformity, Normal muscle strengthen. Back  is symmetric, no curvature. Skin: Skin color, texture, turgor normal. No rashes or lesions  Lymph nodes: Cervical, supraclavicular, and axillary nodes normal.  Neurologic: CN 2 -12 Normal, Normal symmetric reflexes. Normal coordination and gait  Psych: Alert & Oriented x 3, Mood appear stable.    Assessment:    Annual wellness medicare exam   Plan:    During the course of the visit the patient was educated and counseled about appropriate screening and preventive services including:   Recommend annual flu vaccine.  Has received Prevnar 13.  Recommend annual mammogram.       Patient Instructions (the written plan) was given to the patient.  Medicare Attestation  I have personally reviewed:  The patient's medical and social history  Their use of alcohol, tobacco or illicit  drugs  Their current medications and supplements  The patient's functional ability including ADLs,fall risks, home safety risks, cognitive, and hearing and visual impairment  Diet and physical activities  Evidence for depression or mood disorders  The patient's weight, height, BMI, and visual acuity have been recorded in the chart. I have made referrals, counseling, and provided education to the patient based on review of the above and I have provided the patient with a written personalized care plan for preventive services.      

## 2017-12-18 ENCOUNTER — Telehealth (HOSPITAL_COMMUNITY): Payer: Self-pay | Admitting: *Deleted

## 2017-12-18 NOTE — Telephone Encounter (Signed)
Patient given detailed instructions per Myocardial Perfusion Study Information Sheet for the test on 12/20/17. Patient notified to arrive 15 minutes early and that it is imperative to arrive on time for appointment to keep from having the test rescheduled.  If you need to cancel or reschedule your appointment, please call the office within 24 hours of your appointment. . Patient verbalized understanding. Kirstie Peri

## 2017-12-20 ENCOUNTER — Other Ambulatory Visit: Payer: Medicare Other

## 2017-12-20 ENCOUNTER — Ambulatory Visit (HOSPITAL_COMMUNITY): Payer: Medicare Other | Attending: Cardiology

## 2017-12-20 DIAGNOSIS — R079 Chest pain, unspecified: Secondary | ICD-10-CM | POA: Diagnosis not present

## 2017-12-20 LAB — MYOCARDIAL PERFUSION IMAGING
CHL CUP NUCLEAR SDS: 6
CHL CUP NUCLEAR SRS: 7
CHL CUP NUCLEAR SSS: 13
CHL CUP RESTING HR STRESS: 63 {beats}/min
LHR: 0.32
LV dias vol: 83 mL (ref 46–106)
LV sys vol: 20 mL
NUC STRESS TID: 0.87
Peak HR: 90 {beats}/min

## 2017-12-20 MED ORDER — TECHNETIUM TC 99M TETROFOSMIN IV KIT
10.1000 | PACK | Freq: Once | INTRAVENOUS | Status: AC | PRN
  Administered 2017-12-20: 10.1 via INTRAVENOUS
  Filled 2017-12-20: qty 11

## 2017-12-20 MED ORDER — REGADENOSON 0.4 MG/5ML IV SOLN
0.4000 mg | Freq: Once | INTRAVENOUS | Status: AC
  Administered 2017-12-20: 0.4 mg via INTRAVENOUS

## 2017-12-20 MED ORDER — TECHNETIUM TC 99M TETROFOSMIN IV KIT
30.7000 | PACK | Freq: Once | INTRAVENOUS | Status: AC | PRN
  Administered 2017-12-20: 30.7 via INTRAVENOUS
  Filled 2017-12-20: qty 31

## 2017-12-26 NOTE — Progress Notes (Signed)
[place]  [phone] Fax:([phone]     ID: RYNA BECKSTROM DOB: [birth year]  MR#: [id number]  RJJ#:[id number]  Patient Care Team: [name], MD as PCP - General (Internal Medicine) Sherriann Szuch, Virgie Dad, MD as Consulting Physician (Oncology) Collene Gobble, MD as Consulting Physician (Pulmonary Disease) [name], MD as Referring Physician Armbruster, Carlota Raspberry, MD as Consulting Physician (Gastroenterology) PCP: [name], MD GYN: Silvestre Moment SU: Osborn Coho MD] [name] MD OTHER MD: [name] M.D.  CHIEF COMPLAINT: (1) Estrogen receptor positive breast cancer   (2) history of stage IA right lung adenocarcinoma resected November 2006   CURRENT TREATMENT: observation   BREAST CANCER HISTORY:  From Dr. [name] original intake note 07/26/2005: (Lung cancer presentation)  "The patient is a very pleasant 67 year old female without a significant past medical history, who states that in 04/2005 she felt a lump in the left neck.  It did not resolve spontaneously, so she was seen by Dr. [name] [facility], and a CT scan of the neck was performed on 04/25/05.  There was noted to be two lymph nodes on the left, and they were normal in size, but they were asymmetrical.  No other pathologic findings were noted, except for left internal jugular vein, which was atypically positioned.  However, because of these enlarged lymph nodes, a CT scan of the chest was also obtained by Dr. Renold Genta on 04/27/05, and the CT scan did reveal a poorly defined nodular opacity medially in the right upper lobe, measuring 6.5 mg anterior to posterior.  Within the right lower lobe there was noted to be a lobular nodular mass measuring 13 x 13 mm, and was felt to be worrisome for a primary lung carcinoma.  No other nodules or effusions were seen on the left lung.  On the mediastinal window images there was slight prominence of right hilar nodes, but no other  evidence of mediastinal or hilar adenopathy.  A PET scan was thereafter obtained on 05/06/05.  The PET scan did reveal increased FDG activity within a small mass in the posterior superior right lower lobe, highly suspicious for a malignancy.  No significant nodal FDG uptake was identified within the hilar regions or the mediastinum.  Also noted was abnormal FDG uptake in the right middle lobe, corresponding to the CT scan findings, and again suspicious for a malignancy.  There was no evidence of metastatic disease within the neck, abdomen or pelvis.  The patient was then referred to [facility], and was seen by Dr. [name].  The patient underwent a right lower lobe wedge resection with completion lobectomy and mediastinal lymph node biopsies.  The pathology revealed the following:  Right upper lobe wedge resection revealed pulmonary tissue with necrotizing granulomatous inflammation.  No evidence of malignancy.  Right middle lobe wedge revealed again pulmonary tissue with necrotizing granulomatous inflammation.  No evidence of malignancy.  The right pleural lobe (lobectomy) revealed an adenocarcinoma, poorly differentiated, grade 3, measuring 1.2 cm.  The visceral pleura was negative.  Chest wall negative.  Mediastinum negative.  All margins were negative, including the pleural and parenchymal margins.  There was no evidence of lymphovascular invasion.  In all lymph nodes sampled, including level 11, level 7, level 12, 4R and 2R lymph nodes negative for malignancy.  The patient was staged as T1 N0 M0, stage IA adenocarcinoma of the right lung.  Postoperatively the patient's course was complicated by the development of pleural effusion, requiring  diuresis.  She had multiple x-rays performed.  Last x-ray performed on 07/07/05 revealed persistent right pleural effusion.  It was recommended that the patient have followups at [facility].  Dr. [name] Baxley kindly refers the patient to me today for medical oncology  evaluation.  Clinically, the patient states that she has recovered well.  She is once again walking.  She was a runner, and she is trying to get back in shape."  From [name]'s 09/30/2009 note: (Breast cancer presentation):  "She tells me that most recently she had her yearly screening mammogram performed that revealed an abnormality.  She also on exam was noted to have a palpable lump in the upper left breast as well.  Because of the abnormality, patient on September 02, 2009 had a digital diagnostic mammogram of the left breast and ultrasound of the left breast.  The mammogram revealed a 7.0 mm area slightly increased density in the upper left breast.  There were no suspicious calcifications noted.  The breast was heterogeneously dense.  Patient then went onto have an ultrasound of the breast performed and again a 7.0 mm irregular hypoechoic shadowing mass was noted in the 12 o'clock position of the left breast 5.0 cm from the nipple.  There were no enlarged or abnormal left axillary lymph nodes identified.  Patient went onto have a core biopsy performed on 09/02/2009 [id number]).  The needle core biopsy of the 12 o'clock mass revealed an invasive mammary carcinoma, low to intermediate grade.  It was lobular carcinoma.  Confirmatory immunohistochemical stains revealed the tumor to be strongly positive for cytokeratin AE1-AE3 with an infiltrative pattern of growth and the tumor was negative for E-cadherin stain confirming a lobular phenotype.  The tumor was estrogen receptor positive at 98%, progesterone receptor positive 6%, proliferation marker Ki-67 by MIB was 14%.  The tumor did not express HER-2/neu by CISH with a signal of 1.04.  Patient was seen by Dr. Neldon Mc on 09/08/2009 for discussion of surgical options.  His recommendation was a lumpectomy.  Patient also had bilateral MRI of the breasts performed, which revealed a 0.8 cm mildly enlarged area of mass-like enhancement at the 12  o'clock location corresponding to the biopsy-proven breast cancer.  There was no evidence of lymphadenopathy."  Her subsequent history is detailed below  INTERVAL HISTORY: Markiah returns today for follow-up of her estrogen receptor positive breast cancer. She is being followed by observation alone. The interval history is generally unremarkable. She denies any recent changes in her breasts.  Since her last visit, she underwent routine screening bilateral mammography with CAD and tomography on 09/27/2017 at [place] showing: breast density category C. There was no evidence of malignancy.   REVIEW OF SYSTEMS: Phala reports that her dog is 52 y.o. and she walks her twice per day for 20 minutes. She noticed that her dog is getting older, but she is glad she also has a cat. She used to run for exercise. She notes that her husband has some health issues and is declining, which is stressful. She denies unusual headaches, visual changes, nausea, vomiting, or dizziness. There has been no unusual cough, phlegm production, or pleurisy. This been no change in bowel or bladder habits. She denies unexplained fatigue or unexplained weight loss, bleeding, rash, or fever. A detailed review of systems was otherwise stable.   PAST MEDICAL HISTORY: Past Medical History:  Diagnosis Date  . Anemia   . BRCA negative 2011   BRCA I/ II  negative  . Breast cancer ([place]) 08/2009   stage 2, rx with lumpectomy and xrt  . COPD (chronic obstructive pulmonary disease) ([facility])    pt.unsure of diagnosis status  . Emphysema of lung ([place])    pt. questions diagnosis  . GERD (gastroesophageal reflux disease)    not presently having symptoms  . Lung cancer ([place]) 06/06/05   stage 1 poorly differentiated adenocarcinoma, s/p right lower lobectomy.  . Osteoporosis   . STD (sexually transmitted disease)    HSV    PAST SURGICAL HISTORY: Past Surgical History:  Procedure Laterality Date  . BREAST LUMPECTOMY   10/12/2009   Left lumpectomy and radiation, stage II, ER/PR+, Her 2 nu negative  . BUNIONECTOMY    . COLONOSCOPY    . LOBECTOMY  06/06/2005   Right  . TONSILLECTOMY    . TUBAL LIGATION  1984    FAMILY HISTORY Family History  Problem Relation Age of Onset  . Allergies Mother   . Asthma Mother   . Lung cancer Mother   . Breast cancer Mother 44       Recurrance age 55  . Colon cancer Father   . Prostate cancer Brother   . Breast cancer Sister 49       Recurrence age 44 BRCA negative  . Colon polyps Sister   . Breast cancer Sister 46  . Breast cancer Maternal Grandmother 30  . Esophageal cancer Neg Hx   . Rectal cancer Neg Hx   . Stomach cancer Neg Hx     GYNECOLOGIC HISTORY:  No LMP recorded. Patient is postmenopausal. Menarche age 80, the patient is GX P0. She went through the change of life in approximately age 36. She did not take hormone replacement.  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 It's just the 2 of them at home. She takes care of 82-year-old for a neighbor and is the primary caregiver for her husband who has had a couple of small strokes and has myelodysplasia. She helps a friend out with her business. She walks her 47 year old lab. 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 Smoking status: Former Smoker    Packs/day: 2.00    Years: 18.00    Pack years: 36.00    Last attempt to quit: 07/12/1987    Years since quitting: 30.4  . Smokeless tobacco: Never Used  Substance Use Topics  . Alcohol use: Yes    Alcohol/week: 2.4 oz    Types: 4 Standard drinks or equivalent per week    Comment: 2 per day  . Drug use: No    Allergies  Allergen Reactions  . Clarithromycin     REACTION: rash    Current Outpatient Medications  Medication Sig Dispense Refill  .  acyclovir (ZOVIRAX) 400 MG tablet TAKE 1 TABLET BY MOUTH TWICE A DAY 90 tablet 0  . aspirin 81 MG chewable tablet Chew by mouth.    . Calcium Carbonate (CALCIUM 600 PO) Take 600 mg by mouth daily.      . cholecalciferol (VITAMIN D) 1000 UNITS tablet Take 1,000 Units by mouth daily.    . Psyllium (METAMUCIL PO) Take by mouth daily.      . vitamin C (ASCORBIC ACID) 500 MG tablet Take 500 mg by mouth daily.     No  current facility-administered medications for this visit.     OBJECTIVE: Middle-aged white woman who appear stated age  74:   12/27/17 1155  BP: (!) 159/80  Pulse: 80  Resp: 18  Temp: 97.9 F (36.6 C)  SpO2: 100%     Body mass index is 19.68 kg/m.    ECOG FS:0 - Asymptomatic   Sclerae unicteric, EOMs intact Oropharynx clear and moist No cervical or supraclavicular adenopathy Lungs no rales or rhonchi Heart regular rate and rhythm Abd soft, nontender, positive bowel sounds MSK no focal spinal tenderness, no upper extremity lymphedema Neuro: nonfocal, well oriented, appropriate affect Breasts: The right is unremarkable. The left breast is status post lumpectomy followed by radiation. There is no evidence of local recurrence. Both axillae are benign.      LAB RESULTS:  CMP     Component Value Date/Time   NA 141 11/07/2017 0906   NA 139 12/22/2016 1141   K 4.8 11/07/2017 0906   K 4.2 12/22/2016 1141   CL 103 11/07/2017 0906   CL 103 11/22/2012 1025   CO2 30 11/07/2017 0906   CO2 28 12/22/2016 1141   GLUCOSE 89 11/07/2017 0906   GLUCOSE 92 12/22/2016 1141   GLUCOSE 94 11/22/2012 1025   BUN 18 11/07/2017 0906   BUN 13.2 12/22/2016 1141   CREATININE 0.64 11/07/2017 0906   CREATININE 0.7 12/22/2016 1141   CALCIUM 9.4 11/07/2017 0906   CALCIUM 9.7 12/22/2016 1141   PROT 7.1 11/07/2017 0906   PROT 7.0 12/22/2016 1141   ALBUMIN 3.8 12/22/2016 1141   AST 18 11/07/2017 0906   AST 18 12/22/2016 1141   ALT 8 11/07/2017 0906   ALT 10 12/22/2016 1141    ALKPHOS 81 12/22/2016 1141   BILITOT 0.5 11/07/2017 0906   BILITOT 0.65 12/22/2016 1141   GFRNONAA 93 11/07/2017 0906   GFRAA 108 11/07/2017 0906    INo results found for: SPEP, UPEP  Lab Results  Component Value Date   WBC 3.7 (L) 11/07/2017   NEUTROABS 2,424 11/07/2017   HGB 12.6 11/07/2017   HCT 37.1 11/07/2017   MCV 93.2 11/07/2017   PLT 304 11/07/2017      Chemistry      Component Value Date/Time   NA 141 11/07/2017 0906   NA 139 12/22/2016 1141   K 4.8 11/07/2017 0906   K 4.2 12/22/2016 1141   CL 103 11/07/2017 0906   CL 103 11/22/2012 1025   CO2 30 11/07/2017 0906   CO2 28 12/22/2016 1141   BUN 18 11/07/2017 0906   BUN 13.2 12/22/2016 1141   CREATININE 0.64 11/07/2017 0906   CREATININE 0.7 12/22/2016 1141      Component Value Date/Time   CALCIUM 9.4 11/07/2017 0906   CALCIUM 9.7 12/22/2016 1141   ALKPHOS 81 12/22/2016 1141   AST 18 11/07/2017 0906   AST 18 12/22/2016 1141   ALT 8 11/07/2017 0906   ALT 10 12/22/2016 1141   BILITOT 0.5 11/07/2017 0906   BILITOT 0.65 12/22/2016 1141       Lab Results  Component Value Date   LABCA2 18 09/30/2009    No components found for: [id number]  No results for input(s): INR in the last 168 hours.  Urinalysis    Component Value Date/Time   BILIRUBINUR neg 09/12/2013 1012   PROTEINUR neg 09/12/2013 1012   UROBILINOGEN negative 09/12/2013 1012   NITRITE neg 09/12/2013 1012   LEUKOCYTESUR Negative 09/12/2013 1012    STUDIES: Since her last visit,  she underwent routine screening bilateral mammography with CAD and tomography on 09/27/2017 at [place] showing: breast density category C. There was no evidence of malignancy.    ASSESSMENT: 67 y.o. [place], [place] woman status post right upper lobe wedge resection, middle lobe wedge resection, lower lobectomy and mediastinal lymph node dissection 06/06/2005 for a 1.2 cm grade 3 adenocarcinoma, pT1 pN1, stage Ia  (a) followed at [facility] with every other year chest  CT, next due July 2017  (1) status post left breast upper outer quadrant biopsy 09/02/2009 for an invasive lobular carcinoma (E-cadherin negative) estrogen receptor 98% positive, progesterone receptor 6% positive, with an MIB-1 of 14% and no HER-2 amplification. [[id number]]  (2) status post left lumpectomy and sentinel lymph node sampling 10/12/2009 for a pT1b pN1, stage IIA invasive lobular breast cancer, with negative margins.  (3) Oncotype DX score of 16 predicts a risk of outside the breast recurrence of 10% if the patient's only systemic treatment is tamoxifen for 5 years. It also predicts no benefit from adjuvant chemotherapy  (4) completed adjuvant radiation therapy 01/28/2010, receiving 5040 cGy to the left breast, with a boost to the upper inner aspect of the breast (to a cumulative dose of 6300 cGy); the axillary and supraclavicular regions received 4500 cGy  (5) started anastrozole July 2011, completedI 5 years July 2016  (6) bone density 02/21/2012 at [facility] showed osteoporosis with a T score of -2.5; on alendronate  PLAN: Shakena is now 8 years out for definitive surgery for her breast cancer with no evidence of recurrence. This is very favorable.   Her mammogram continues to show breast density category C. She will continues to do TOMO yearly.   She has had two episode of left nipple discharge and we discussed proceeding to MRI. Since this has not happened for several months, we decided to wait til it recurs and obtain the MRI then.   She is very concerned about her husband. Today I gave her an out of facility DNR form to take to her husband's doctor to get completed.   Her lung cancer continues to be [facility] at [facility] next visit in November.   She will return to see me in one year.   She knows to call for any problems that may develop before then.    Kaydan Wong, Virgie Dad, MD  12/27/17 12:15 PM Medical Oncology and Hematology [facility] [address] [place], [place] [address] Tel. [phone]    Fax. [phone]  Alice Rieger, am acting as scribe for Chauncey Cruel MD.  I, Lurline Del MD, have reviewed the above documentation for accuracy and completeness, and I agree with the above.

## 2017-12-27 ENCOUNTER — Telehealth: Payer: Self-pay | Admitting: Oncology

## 2017-12-27 ENCOUNTER — Inpatient Hospital Stay: Payer: Medicare Other | Attending: Oncology | Admitting: Oncology

## 2017-12-27 VITALS — BP 159/80 | HR 80 | Temp 97.9°F | Resp 18 | Ht 66.0 in | Wt 121.9 lb

## 2017-12-27 DIAGNOSIS — Z79899 Other long term (current) drug therapy: Secondary | ICD-10-CM | POA: Diagnosis not present

## 2017-12-27 DIAGNOSIS — Z853 Personal history of malignant neoplasm of breast: Secondary | ICD-10-CM | POA: Diagnosis not present

## 2017-12-27 DIAGNOSIS — M810 Age-related osteoporosis without current pathological fracture: Secondary | ICD-10-CM | POA: Insufficient documentation

## 2017-12-27 DIAGNOSIS — Z7982 Long term (current) use of aspirin: Secondary | ICD-10-CM | POA: Insufficient documentation

## 2017-12-27 DIAGNOSIS — Z923 Personal history of irradiation: Secondary | ICD-10-CM | POA: Insufficient documentation

## 2017-12-27 DIAGNOSIS — Z170 Estrogen receptor positive status [ER+]: Secondary | ICD-10-CM

## 2017-12-27 DIAGNOSIS — Z9221 Personal history of antineoplastic chemotherapy: Secondary | ICD-10-CM | POA: Diagnosis not present

## 2017-12-27 DIAGNOSIS — Z85118 Personal history of other malignant neoplasm of bronchus and lung: Secondary | ICD-10-CM | POA: Diagnosis not present

## 2017-12-27 DIAGNOSIS — Z87891 Personal history of nicotine dependence: Secondary | ICD-10-CM | POA: Insufficient documentation

## 2017-12-27 DIAGNOSIS — C50212 Malignant neoplasm of upper-inner quadrant of left female breast: Secondary | ICD-10-CM

## 2017-12-27 DIAGNOSIS — C3431 Malignant neoplasm of lower lobe, right bronchus or lung: Secondary | ICD-10-CM

## 2017-12-27 NOTE — Telephone Encounter (Signed)
Gave patient avs and calendar of upcoming June 2020 appointments.  °

## 2018-01-03 DIAGNOSIS — H04123 Dry eye syndrome of bilateral lacrimal glands: Secondary | ICD-10-CM | POA: Diagnosis not present

## 2018-01-03 DIAGNOSIS — H16223 Keratoconjunctivitis sicca, not specified as Sjogren's, bilateral: Secondary | ICD-10-CM | POA: Diagnosis not present

## 2018-01-03 DIAGNOSIS — H01003 Unspecified blepharitis right eye, unspecified eyelid: Secondary | ICD-10-CM | POA: Diagnosis not present

## 2018-01-03 DIAGNOSIS — H40013 Open angle with borderline findings, low risk, bilateral: Secondary | ICD-10-CM | POA: Diagnosis not present

## 2018-01-19 ENCOUNTER — Other Ambulatory Visit: Payer: Self-pay | Admitting: Internal Medicine

## 2018-01-19 ENCOUNTER — Telehealth: Payer: Self-pay | Admitting: *Deleted

## 2018-01-19 ENCOUNTER — Other Ambulatory Visit: Payer: Self-pay | Admitting: *Deleted

## 2018-01-19 DIAGNOSIS — C50212 Malignant neoplasm of upper-inner quadrant of left female breast: Secondary | ICD-10-CM

## 2018-01-19 DIAGNOSIS — Z170 Estrogen receptor positive status [ER+]: Principal | ICD-10-CM

## 2018-01-19 DIAGNOSIS — N63 Unspecified lump in unspecified breast: Secondary | ICD-10-CM

## 2018-01-19 NOTE — Telephone Encounter (Signed)
This RN spoke with pt per her call stating she has noticed a small marble size nodule in her left breast at approximately the 11 oclock position.  Anna Valentine states she can feel the above when she lays down with arm up by her head - sitting or standing the nodule is difficult to find.  Plan per above is to proceed with a dedicated study of area per patient's concern.

## 2018-01-22 ENCOUNTER — Other Ambulatory Visit: Payer: Self-pay | Admitting: Oncology

## 2018-01-22 DIAGNOSIS — Z170 Estrogen receptor positive status [ER+]: Principal | ICD-10-CM

## 2018-01-22 DIAGNOSIS — C50212 Malignant neoplasm of upper-inner quadrant of left female breast: Secondary | ICD-10-CM

## 2018-01-22 DIAGNOSIS — N63 Unspecified lump in unspecified breast: Secondary | ICD-10-CM

## 2018-01-25 ENCOUNTER — Other Ambulatory Visit: Payer: Self-pay | Admitting: Oncology

## 2018-01-25 ENCOUNTER — Ambulatory Visit
Admission: RE | Admit: 2018-01-25 | Discharge: 2018-01-25 | Disposition: A | Discharge status: Home / Self Care | Payer: Medicare Other | Source: Ambulatory Visit | Attending: Oncology | Admitting: Oncology

## 2018-01-25 DIAGNOSIS — R599 Enlarged lymph nodes, unspecified: Secondary | ICD-10-CM

## 2018-01-25 DIAGNOSIS — C50212 Malignant neoplasm of upper-inner quadrant of left female breast: Secondary | ICD-10-CM

## 2018-01-25 DIAGNOSIS — Z170 Estrogen receptor positive status [ER+]: Principal | ICD-10-CM

## 2018-01-25 DIAGNOSIS — N6489 Other specified disorders of breast: Secondary | ICD-10-CM | POA: Diagnosis not present

## 2018-01-25 DIAGNOSIS — N63 Unspecified lump in unspecified breast: Secondary | ICD-10-CM

## 2018-01-25 DIAGNOSIS — R922 Inconclusive mammogram: Secondary | ICD-10-CM | POA: Diagnosis not present

## 2018-01-26 ENCOUNTER — Encounter: Payer: Self-pay | Admitting: Internal Medicine

## 2018-01-29 ENCOUNTER — Ambulatory Visit
Admission: RE | Admit: 2018-01-29 | Discharge: 2018-01-29 | Disposition: A | Discharge status: Home / Self Care | Payer: Medicare Other | Source: Ambulatory Visit | Attending: Oncology | Admitting: Oncology

## 2018-01-29 ENCOUNTER — Other Ambulatory Visit: Payer: Self-pay | Admitting: Oncology

## 2018-01-29 DIAGNOSIS — N63 Unspecified lump in unspecified breast: Secondary | ICD-10-CM

## 2018-01-29 DIAGNOSIS — R599 Enlarged lymph nodes, unspecified: Secondary | ICD-10-CM

## 2018-01-29 DIAGNOSIS — C50212 Malignant neoplasm of upper-inner quadrant of left female breast: Secondary | ICD-10-CM

## 2018-01-29 DIAGNOSIS — Z170 Estrogen receptor positive status [ER+]: Principal | ICD-10-CM

## 2018-01-29 DIAGNOSIS — N6311 Unspecified lump in the right breast, upper outer quadrant: Secondary | ICD-10-CM | POA: Diagnosis not present

## 2018-01-29 DIAGNOSIS — C50411 Malignant neoplasm of upper-outer quadrant of right female breast: Secondary | ICD-10-CM | POA: Diagnosis not present

## 2018-01-29 DIAGNOSIS — R590 Localized enlarged lymph nodes: Secondary | ICD-10-CM | POA: Diagnosis not present

## 2018-01-31 ENCOUNTER — Telehealth: Payer: Self-pay | Admitting: Oncology

## 2018-01-31 ENCOUNTER — Other Ambulatory Visit: Payer: Self-pay | Admitting: Oncology

## 2018-01-31 NOTE — Telephone Encounter (Signed)
Tried to reach regarding 8/9 will send letter

## 2018-01-31 NOTE — Progress Notes (Signed)
Anna Valentine now has recurrent disease in the previously irradiated breast.  She is going to be seeing Dr. Donne Hazel on 0808.  She will see me 0809.  I am asking genetics to see if she was previously tested.  If not that really needs to be done at this point.

## 2018-02-01 ENCOUNTER — Telehealth: Payer: Self-pay | Admitting: *Deleted

## 2018-02-01 ENCOUNTER — Encounter: Payer: Self-pay | Admitting: Radiation Oncology

## 2018-02-01 DIAGNOSIS — C50411 Malignant neoplasm of upper-outer quadrant of right female breast: Secondary | ICD-10-CM | POA: Diagnosis not present

## 2018-02-01 NOTE — Telephone Encounter (Signed)
Spoke to pt and gave new appt time to see Dr. Jana Hakim on 02/02/18 at 8am. Confirmed appt date and time

## 2018-02-01 NOTE — Progress Notes (Signed)
Anna Valentine  [phone] [phone]     ID: Anna Valentine DOB: [birth year]  MR#: [id number]  JME#:[id number]  Patient Care Team: [name], MD as PCP - General (Internal Medicine) Otoniel Myhand, Virgie Dad, MD as Consulting Physician (Oncology) Collene Gobble, MD as Consulting Physician (Pulmonary Disease) [name], MD as Referring Physician Armbruster, Carlota Raspberry, MD as Consulting Physician (Gastroenterology) Kem Boroughs, [place] (Family Medicine) [name], MD as Consulting Physician (Radiation Oncology) Rolm Bookbinder, MD as Consulting Physician (General Surgery)  CHIEF COMPLAINT:  (1) Estrogen receptor positive left-sided breast cancer    (2) history of stage IA right lung adenocarcinoma resected November 2006    (3) estrogen receptor positive right-sided breast cancer   CURRENT TREATMENT: Awaiting definitive surgery  INTERVAL HISTORY: [name] returns today for follow-up of her new estrogen receptor positive breast cancer.    She had diagnostic bilateral mammography with CAD and tomography and right ultrasonography on 01/25/2018 at [place] showing: breast density category C. There was a suspicious mass at the 10 o'clock right upper outer position measuring 1.0 x 1.3 x 1.2 cm and located approximately 7 cm from the nipple. Sonographically, there was a single right axillary lymph node with mildly thickened cortex.   She underwent biopsy of this right breast area ([id number]) on 01/29/2018 showing: invasive ductal carcinoma, grade II. There was no evidence of carcinoma in one right axillary lymph node--this was concordant.. Prognostic indicators significant for: estrogen receptor, 95% positive with strong staining intensity and progesterone receptor, 0% negative. Proliferation marker Ki67 at 15%. HER2 amplified with ratios HER2/CEP17 signals 2.52 and average HER2 copies per cell 2.65  She talked to Dr. Donne Valentine on 02/02/2018.  She would prefer intensified screening verses mastectomy. She also notes that Dr. Sondra Valentine and Dr. Donne Valentine are concerned about the effects of doing a lumpectomy and radiation, because of her prior right lung lobectomy.  All these questions and options are addressed below   REVIEW OF SYSTEMS: Anna Valentine reports that she is a little stressed.  Her husband is having some health issues.  She walks her dog everyday for exercise. She denies unusual headaches, visual changes, nausea, vomiting, or dizziness. There has been no unusual cough, phlegm production, or pleurisy. This been no change in bowel or bladder habits. She denies unexplained fatigue or unexplained weight loss, bleeding, rash, or fever. A detailed review of systems was otherwise stable.    LUNG CANCER HISTORY:  From Dr. Dana Valentine original intake note 07/26/2005: (Lung cancer presentation)  "The patient is a very pleasant 67 year old female without a significant past medical history, who states that in 04/2005 she felt a lump in the left neck.  It did not resolve spontaneously, so she was seen by Dr. [name] [facility], and a CT scan of the neck was performed on 04/25/05.  There was noted to be two lymph nodes on the left, and they were normal in size, but they were asymmetrical.  No other pathologic findings were noted, except for left internal jugular vein, which was atypically positioned.  However, because of these enlarged lymph nodes, a CT scan of the chest was also obtained by Dr. Renold Genta on 04/27/05, and the CT scan did reveal a poorly defined nodular opacity medially in the right upper lobe, measuring 6.5 mg anterior to posterior.  Within the right lower lobe there was noted to be a lobular nodular mass measuring 13 x 13 mm, and was felt to be worrisome for a primary lung  carcinoma.  No other nodules or effusions were seen on the left lung.  On the mediastinal window images there was slight prominence of right hilar nodes, but no other  evidence of mediastinal or hilar adenopathy.  A PET scan was thereafter obtained on 05/06/05.  The PET scan did reveal increased FDG activity within a small mass in the posterior superior right lower lobe, highly suspicious for a malignancy.  No significant nodal FDG uptake was identified within the hilar regions or the mediastinum.  Also noted was abnormal FDG uptake in the right middle lobe, corresponding to the CT scan findings, and again suspicious for a malignancy.  There was no evidence of metastatic disease within the neck, abdomen or pelvis.  The patient was then referred to [facility], and was seen by Dr. [name].  The patient underwent a right lower lobe wedge resection with completion lobectomy and mediastinal lymph node biopsies.  The pathology revealed the following:  Right upper lobe wedge resection revealed pulmonary tissue with necrotizing granulomatous inflammation.  No evidence of malignancy.  Right middle lobe wedge revealed again pulmonary tissue with necrotizing granulomatous inflammation.  No evidence of malignancy.  The right pleural lobe (lobectomy) revealed an adenocarcinoma, poorly differentiated, grade 3, measuring 1.2 cm.  The visceral pleura was negative.  Chest wall negative.  Mediastinum negative.  All margins were negative, including the pleural and parenchymal margins.  There was no evidence of lymphovascular invasion.  In all lymph nodes sampled, including level 11, level 7, level 12, 4R and 2R lymph nodes negative for malignancy.  The patient was staged as T1 N0 M0, stage IA adenocarcinoma of the right lung.  Postoperatively the patient's course was complicated by the development of pleural effusion, requiring diuresis.  She had multiple x-rays performed.  Last x-ray performed on 07/07/05 revealed persistent right pleural effusion.  It was recommended that the patient have followups at [facility].  Dr. [name] Baxley kindly refers the patient to me today for medical oncology  evaluation.  Clinically, the patient states that she has recovered well.  She is once again walking.  She was a runner, and she is trying to get back in shape."  LEFT BREAST CANCER HISTORY:From [name]'s 09/30/2009 note:  "She tells me that most recently she had her yearly screening mammogram performed that revealed an abnormality.  She also on exam was noted to have a palpable lump in the upper left breast as well.  Because of the abnormality, patient on September 02, 2009 had a digital diagnostic mammogram of the left breast and ultrasound of the left breast.  The mammogram revealed a 7.0 mm area slightly increased density in the upper left breast.  There were no suspicious calcifications noted.  The breast was heterogeneously dense.  Patient then went onto have an ultrasound of the breast performed and again a 7.0 mm irregular hypoechoic shadowing mass was noted in the 12 o'clock position of the left breast 5.0 cm from the nipple.  There were no enlarged or abnormal left axillary lymph nodes identified.  Patient went onto have a core biopsy performed on 09/02/2009 [phone]).  The needle core biopsy of the 12 o'clock mass revealed an invasive mammary carcinoma, low to intermediate grade.  It was lobular carcinoma.  Confirmatory immunohistochemical stains revealed the tumor to be strongly positive for cytokeratin AE1-AE3 with an infiltrative pattern of growth and the tumor was negative for E-cadherin stain confirming a lobular phenotype.  The tumor was estrogen receptor positive at  98%, progesterone receptor positive 6%, proliferation marker Ki-67 by MIB was 14%.  The tumor did not express HER-2/neu by CISH with a signal of 1.04.  Patient was seen by Dr. Neldon Mc on 09/08/2009 for discussion of surgical options.  His recommendation was a lumpectomy.  Patient also had bilateral MRI of the breasts performed, which revealed a 0.8 cm mildly enlarged area of mass-like enhancement at the 12 o'clock  location corresponding to the biopsy-proven breast cancer.  There was no evidence of lymphadenopathy."   PAST MEDICAL HISTORY: Past Medical History:  Diagnosis Date  . Anemia   . BRCA negative 2011   BRCA I/ II negative  . Breast cancer ([place]) 08/2009   stage 2, rx with lumpectomy and xrt  . COPD (chronic obstructive pulmonary disease) (H[facility])    pt.unsure of diagnosis status  . Emphysema of lung ([place])    pt. questions diagnosis  . GERD (gastroesophageal reflux disease)    not presently having symptoms  . Lung cancer ([place]) 06/06/05   stage 1 poorly differentiated adenocarcinoma, s/p right lower lobectomy.  . Osteoporosis   . STD (sexually transmitted disease)    HSV    PAST SURGICAL HISTORY: Past Surgical History:  Procedure Laterality Date  . BREAST BIOPSY    . BREAST LUMPECTOMY  10/12/2009   Left lumpectomy and radiation, stage II, ER/PR+, Her 2 nu negative  . BUNIONECTOMY    . COLONOSCOPY    . LOBECTOMY  06/06/2005   Right  . TONSILLECTOMY    . TUBAL LIGATION  1984    FAMILY HISTORY Family History  Problem Relation Age of Onset  . Allergies Mother   . Asthma Mother   . Lung cancer Mother   . Breast cancer Mother 11       Recurrance age 40  . Colon cancer Father   . Prostate cancer Brother   . Breast cancer Sister 29       Recurrence age 56 BRCA negative  . Colon polyps Sister   . Breast cancer Sister 40  . Breast cancer Maternal Grandmother 79  . Esophageal cancer Neg Hx   . Rectal cancer Neg Hx   . Stomach cancer Neg Hx   The patient's maternal grandmother was diagnosed with cancer at age 47. Patient's mother was diagnosed at age 40. The patient's sister was diagnosed at age 35 with recurrence at 59 and is BRCA negative. A second sister was diagnosed at age 84.  GYNECOLOGIC HISTORY:  No LMP recorded. Patient is postmenopausal. Menarche age 19, the patient is GX P0. She went through the change of life in approximately age 74. She did not take hormone  replacement.  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 It's just the 2 of them at home. She takes care of 44-year-old for a neighbor and is the primary caregiver for her husband who has had a couple of small strokes and has myelodysplasia. She helps a friend out with her business. She walks her 70 year old lab. 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 Smoking status: Former Smoker    Packs/day: 2.00    Years: 18.00    Pack years: 36.00    Last attempt to quit: 07/12/1987    Years since quitting: 30.5  . Smokeless tobacco: Never Used  Substance Use  Topics  . Alcohol use: Yes    Alcohol/week: 2.4 oz    Types: 4 Standard drinks or equivalent per week    Comment: 2 per day  . Drug use: No    Allergies  Allergen Reactions  . Clarithromycin     REACTION: rash    Current Outpatient Medications  Medication Sig Dispense Refill  . acyclovir (ZOVIRAX) 400 MG tablet TAKE 1 TABLET BY MOUTH TWICE A DAY 90 tablet 0  . aspirin 81 MG chewable tablet Chew by mouth.    . Calcium Carbonate (CALCIUM 600 PO) Take 600 mg by mouth daily.      . cholecalciferol (VITAMIN D) 1000 UNITS tablet Take 1,000 Units by mouth daily.    . Psyllium (METAMUCIL PO) Take by mouth daily.      . vitamin C (ASCORBIC ACID) 500 MG tablet Take 500 mg by mouth daily.     No current facility-administered medications for this visit.     OBJECTIVE: Middle-aged white woman in no acute distress  Vitals:   02/02/18 0808  BP: (!) 147/79  Pulse: 75  Resp: 18  Temp: 97.7 F (36.5 C)  SpO2: 100%     Body mass index is 19.19 kg/m.    ECOG FS:0 - Asymptomatic   Sclerae unicteric, pupils round and equal Oropharynx clear and moist No cervical or supraclavicular adenopathy Lungs no rales or rhonchi Heart regular rate and  rhythm Abd soft, nontender, positive bowel sounds MSK no focal spinal tenderness, no upper extremity lymphedema Neuro: nonfocal, well oriented, appropriate affect Breasts: The right breast is status post recent biopsy.  There is a mild ecchymosis.  There is no palpable mass.  The left breast status post lumpectomy and radiation.  There is no evidence of local recurrence.  Both axillae are benign.  LAB RESULTS:  CMP     Component Value Date/Time   NA 141 11/07/2017 0906   NA 139 12/22/2016 1141   K 4.8 11/07/2017 0906   K 4.2 12/22/2016 1141   CL 103 11/07/2017 0906   CL 103 11/22/2012 1025   CO2 30 11/07/2017 0906   CO2 28 12/22/2016 1141   GLUCOSE 89 11/07/2017 0906   GLUCOSE 92 12/22/2016 1141   GLUCOSE 94 11/22/2012 1025   BUN 18 11/07/2017 0906   BUN 13.2 12/22/2016 1141   CREATININE 0.64 11/07/2017 0906   CREATININE 0.7 12/22/2016 1141   CALCIUM 9.4 11/07/2017 0906   CALCIUM 9.7 12/22/2016 1141   PROT 7.1 11/07/2017 0906   PROT 7.0 12/22/2016 1141   ALBUMIN 3.8 12/22/2016 1141   AST 18 11/07/2017 0906   AST 18 12/22/2016 1141   ALT 8 11/07/2017 0906   ALT 10 12/22/2016 1141   ALKPHOS 81 12/22/2016 1141   BILITOT 0.5 11/07/2017 0906   BILITOT 0.65 12/22/2016 1141   GFRNONAA 93 11/07/2017 0906   GFRAA 108 11/07/2017 0906    INo results found for: SPEP, UPEP  Lab Results  Component Value Date   WBC 3.7 (L) 11/07/2017   NEUTROABS 2,424 11/07/2017   HGB 12.6 11/07/2017   HCT 37.1 11/07/2017   MCV 93.2 11/07/2017   PLT 304 11/07/2017      Chemistry      Component Value Date/Time   NA 141 11/07/2017 0906   NA 139 12/22/2016 1141   K 4.8 11/07/2017 0906   K 4.2 12/22/2016 1141   CL 103 11/07/2017 0906   CL 103 11/22/2012 1025   CO2 30 11/07/2017 0906  CO2 28 12/22/2016 1141   BUN 18 11/07/2017 0906   BUN 13.2 12/22/2016 1141   CREATININE 0.64 11/07/2017 0906   CREATININE 0.7 12/22/2016 1141      Component Value Date/Time   CALCIUM 9.4 11/07/2017  0906   CALCIUM 9.7 12/22/2016 1141   ALKPHOS 81 12/22/2016 1141   AST 18 11/07/2017 0906   AST 18 12/22/2016 1141   ALT 8 11/07/2017 0906   ALT 10 12/22/2016 1141   BILITOT 0.5 11/07/2017 0906   BILITOT 0.65 12/22/2016 1141       Lab Results  Component Value Date   LABCA2 18 09/30/2009    No components found for: [id number]  No results for input(s): INR in the last 168 hours.  Urinalysis    Component Value Date/Time   BILIRUBINUR neg 09/12/2013 1012   PROTEINUR neg 09/12/2013 1012   UROBILINOGEN negative 09/12/2013 1012   NITRITE neg 09/12/2013 1012   LEUKOCYTESUR Negative 09/12/2013 1012    STUDIES: US Breast Ltd Uni Right Inc Axilla  Addendum Date: 02/01/2018   ADDENDUM REPORT: 02/01/2018 08:54 ADDENDUM: The sonographic measurements of the mass at the 10 o'clock position approximately 7 cm from the nipple are: 1.0 x 1.3 x 1.2 cm. Electronically Signed   By: Evangeline Dakin M.D.   On: 02/01/2018 08:54   Result Date: 02/01/2018 CLINICAL DATA:  Palpable abnormality in the RIGHT breast. The patient has a history of LEFT cancer, treated with lumpectomy and radiation therapy in 2011. Patient also has a history of lung cancer. The patient's maternal grandmother was diagnosed with cancer at age 79. Patient's mother was diagnosed at age 44. The patient's sister was diagnosed at age 59 with recurrence at 32 and is BRCA negative. A second sister was diagnosed at age 7. EXAM: DIGITAL DIAGNOSTIC RIGHT MAMMOGRAM WITH CAD AND TOMO ULTRASOUND RIGHT BREAST COMPARISON:  Previous exam(s). ACR Breast Density Category c: The breast tissue is heterogeneously dense, which may obscure small masses. FINDINGS: There is minimal asymmetry in the UPPER OUTER QUADRANT of the RIGHT breast, corresponding to the area of patient's concern. Mammographic images were processed with CAD. On physical exam, I palpate a discrete small mass in the UPPER-OUTER QUADRANT of the RIGHT breast corresponding to the area of  patient's concern. Targeted ultrasound is performed, showing an oval mass with irregular margins in the 10 o'clock location RIGHT breast 7 centimeters from nipple. Mass is mildly heterogeneous, vascular and associated posterior acoustic enhancement. Evaluation of the RIGHT axilla shows a single lymph node with mildly thickened cortex (4 millimeters) warranting tissue diagnosis. IMPRESSION: 1. Suspicious mass the 10 o'clock location of the RIGHT breast for which biopsy is recommended. 2. Mildly prominent RIGHT axillary lymph node warranting tissue diagnosis to exclude metastatic disease. RECOMMENDATION: 1. Ultrasound-guided core biopsy of mass in the 10 o'clock location of the RIGHT breast. 2. Ultrasound-guided core biopsy of RIGHT axillary lymph node. 3. Biopsies have been scheduled for the patient on 01/29/2018. I have discussed the findings and recommendations with the patient. Results were also provided in writing at the conclusion of the visit. If applicable, a reminder letter will be sent to the patient regarding the next appointment. BI-RADS CATEGORY  4: Suspicious. Electronically Signed: By: Nolon Nations M.D. On: 01/25/2018 16:45   Mm Diag Breast Tomo Uni Right  Addendum Date: 02/01/2018   ADDENDUM REPORT: 02/01/2018 08:54 ADDENDUM: The sonographic measurements of the mass at the 10 o'clock position approximately 7 cm from the nipple are: 1.0 x 1.3 x 1.2  cm. Electronically Signed   By: Evangeline Dakin M.D.   On: 02/01/2018 08:54   Result Date: 02/01/2018 CLINICAL DATA:  Palpable abnormality in the RIGHT breast. The patient has a history of LEFT cancer, treated with lumpectomy and radiation therapy in 2011. Patient also has a history of lung cancer. The patient's maternal grandmother was diagnosed with cancer at age 47. Patient's mother was diagnosed at age 54. The patient's sister was diagnosed at age 12 with recurrence at 59 and is BRCA negative. A second sister was diagnosed at age 52. EXAM:  DIGITAL DIAGNOSTIC RIGHT MAMMOGRAM WITH CAD AND TOMO ULTRASOUND RIGHT BREAST COMPARISON:  Previous exam(s). ACR Breast Density Category c: The breast tissue is heterogeneously dense, which may obscure small masses. FINDINGS: There is minimal asymmetry in the UPPER OUTER QUADRANT of the RIGHT breast, corresponding to the area of patient's concern. Mammographic images were processed with CAD. On physical exam, I palpate a discrete small mass in the UPPER-OUTER QUADRANT of the RIGHT breast corresponding to the area of patient's concern. Targeted ultrasound is performed, showing an oval mass with irregular margins in the 10 o'clock location RIGHT breast 7 centimeters from nipple. Mass is mildly heterogeneous, vascular and associated posterior acoustic enhancement. Evaluation of the RIGHT axilla shows a single lymph node with mildly thickened cortex (4 millimeters) warranting tissue diagnosis. IMPRESSION: 1. Suspicious mass the 10 o'clock location of the RIGHT breast for which biopsy is recommended. 2. Mildly prominent RIGHT axillary lymph node warranting tissue diagnosis to exclude metastatic disease. RECOMMENDATION: 1. Ultrasound-guided core biopsy of mass in the 10 o'clock location of the RIGHT breast. 2. Ultrasound-guided core biopsy of RIGHT axillary lymph node. 3. Biopsies have been scheduled for the patient on 01/29/2018. I have discussed the findings and recommendations with the patient. Results were also provided in writing at the conclusion of the visit. If applicable, a reminder letter will be sent to the patient regarding the next appointment. BI-RADS CATEGORY  4: Suspicious. Electronically Signed: By: Nolon Nations M.D. On: 01/25/2018 16:45   [place] Axillary Node Core Biopsy Right  Addendum Date: 01/30/2018   ADDENDUM REPORT: 01/30/2018 14:32 ADDENDUM: Pathology revealed GRADE II INVASIVE DUCTAL CARCINOMA of the Right breast, 10 o'clock. THERE IS NO EVIDENCE OF CARCINOMA of the inferior right axillary  node. This was found to be concordant by Dr. Curlene Dolphin. Pathology results were discussed with the patient by telephone. The patient reported doing well after the biopsies with tenderness at the sites. Post biopsy instructions and care were reviewed and questions were answered. The patient was encouraged to call [facility] for any additional concerns. Surgical consultation has been arranged with Dr. Rolm Bookbinder, per patient request, at [facility] on February 15, 2018. Dr. Gunnar Bulla Venida Tsukamoto was notified of biopsy results via EPIC message on January 30, 2018. Pathology results reported by Terie Purser, RN on 01/30/2018. Electronically Signed   By: Curlene Dolphin M.D.   On: 01/30/2018 14:32   Result Date: 01/30/2018 CLINICAL DATA:  Ultrasound-guided core needle biopsy was recommended a right axillary lymph node with thickened cortex. A right breast biopsy was also performed today and dictated separately. EXAM: [place] AXILLARY NODE CORE BIOPSY RIGHT COMPARISON:  Previous exam(s). FINDINGS: I met with the patient and we discussed the procedure of ultrasound-guided biopsy, including benefits and alternatives. We discussed the high likelihood of a successful procedure. We discussed the risks of the procedure, including infection, bleeding, tissue injury, clip migration, and inadequate sampling. Informed written  consent was given. The usual time-out protocol was performed immediately prior to the procedure. Using sterile technique and 1% Lidocaine as local anesthetic, under direct ultrasound visualization, a 14 gauge spring-loaded device was used to perform biopsy of a an inferior right axillary lymph node using a lateral approach. At the conclusion of the procedure a HydroMARK tissue marker clip was deployed into the biopsy cavity. Follow up 2 view mammogram was performed and dictated separately. IMPRESSION: Ultrasound guided biopsy of right axillary lymph node. No apparent  complications. Electronically Signed: By: Curlene Dolphin M.D. On: 01/29/2018 16:17   Mm Clip Placement Right  Result Date: 01/29/2018 CLINICAL DATA:  Biopsies were performed of a right breast mass at 10 o'clock position and a right axillary lymph node. EXAM: DIAGNOSTIC RIGHT MAMMOGRAM POST ULTRASOUND BIOPSY COMPARISON:  Previous exam(s). FINDINGS: Mammographic images were obtained following ultrasound guided biopsies of a right breast mass at 10 o'clock position and of a right axillary lymph node. Ribbon shaped biopsy clip is satisfactorily positioned within the biopsied mass. The right axillary lymph node biopsy clip is not included on the standard mammogram images. Dedicated focal spot compression views of the right axillary region are performed. The [facility] biopsy clip is not visualized mammographically. However, on the images for the ultrasound-guided biopsy of the right axilla, arrows were placed at site of the visualized biopsy clip. IMPRESSION: Satisfactory position of ribbon shaped biopsy clip in the biopsied right breast mass. The [facility] biopsy clip placed along the lateral edge of the biopsied right axillary lymph node is not visualized on the mammogram. This clip is best visualized by ultrasound given its position. Final Assessment: Post Procedure Mammograms for Marker Placement Electronically Signed   By: Curlene Dolphin M.D.   On: 01/29/2018 16:24   [place] Rt Breast Bx W Loc Dev 1st Lesion Img Bx Spec US Guide  Addendum Date: 01/30/2018   ADDENDUM REPORT: 01/30/2018 14:33 ADDENDUM: Pathology revealed GRADE II INVASIVE DUCTAL CARCINOMA of the Right breast, 10 o'clock. THERE IS NO EVIDENCE OF CARCINOMA of the inferior right axillary node. This was found to be concordant by Dr. Curlene Dolphin. Pathology results were discussed with the patient by telephone. The patient reported doing well after the biopsies with tenderness at the sites. Post biopsy instructions and care were reviewed and questions were  answered. The patient was encouraged to call [facility] for any additional concerns. Surgical consultation has been arranged with Dr. Rolm Bookbinder, per patient request, at [facility] on February 15, 2018. Dr. Gunnar Bulla Hildur Bayer was notified of biopsy results via EPIC message on January 30, 2018. Pathology results reported by Terie Purser, RN on 01/30/2018. Electronically Signed   By: Curlene Dolphin M.D.   On: 01/30/2018 14:33   Result Date: 01/30/2018 CLINICAL DATA:  67 year old patient presents for biopsy of a suspicious palpable mass in the 10 o'clock region of the right breast approximately 7 cm from the nipple. She has a personal history of left breast cancer and right lung cancer. EXAM: ULTRASOUND GUIDED RIGHT BREAST CORE NEEDLE BIOPSY COMPARISON:  Previous exam(s). FINDINGS: I met with the patient and we discussed the procedure of ultrasound-guided biopsy, including benefits and alternatives. We discussed the high likelihood of a successful procedure. We discussed the risks of the procedure, including infection, bleeding, tissue injury, clip migration, and inadequate sampling. Informed written consent was given. The usual time-out protocol was performed immediately prior to the procedure. Lesion quadrant: Upper outer quadrant Using sterile technique and 1%  Lidocaine as local anesthetic, under direct ultrasound visualization, a 12 gauge spring-loaded device was used to perform biopsy of an irregular palpable 1.3 cm mass at 10 o'clock position using a lateral approach. At the conclusion of the procedure a ribbon shaped tissue marker clip was deployed into the biopsy cavity. Follow up 2 view mammogram was performed and dictated separately. IMPRESSION: Ultrasound guided biopsy of the right breast. No apparent complications. Electronically Signed: By: Curlene Dolphin M.D. On: 01/29/2018 16:16    ASSESSMENT: 67 y.o. [place], [place] woman status post right upper lobe wedge resection,  middle lobe wedge resection, lower lobectomy and mediastinal lymph node dissection 06/06/2005 for a 1.2 cm grade 3 adenocarcinoma, pT1 pN1, stage Ia  (a) followed at [facility] with every other year chest CT, most recently 06/06/2017  LEFT BREAST CANCER: (1) status post left breast upper outer quadrant biopsy 09/02/2009 for an invasive lobular carcinoma (E-cadherin negative) estrogen receptor 98% positive, progesterone receptor 6% positive, with an MIB-1 of 14% and no HER-2 amplification. [[id number]]  (2) status post left lumpectomy and sentinel lymph node sampling 10/12/2009 for a pT1b pN1, stage IIA invasive lobular breast cancer, with negative margins.  (3) Oncotype DX score of 16 predicts a risk of outside the breast recurrence of 10% if the patient's only systemic treatment is tamoxifen for 5 years. It also predicts no benefit from adjuvant chemotherapy  (4) completed adjuvant radiation therapy 01/28/2010, receiving 5040 cGy to the left breast, with a boost to the upper inner aspect of the breast (to a cumulative dose of 6300 cGy); the axillary and supraclavicular regions received 4500 cGy  (5) started anastrozole July 2011, completedI 5 years July 2016  RIGHT BREAST CANCER: (6) status post right breast upper outer quadrant biopsy 01/29/2018 for a clinical  T1c N0, stage IA invasive ductal carcinoma, grade 2, estrogen receptor positive, progesterone receptor negative, HER-2 amplified, with an MIB-1 of 15%  (7) definitive surgery pending  (8) chemotherapy to consist of paclitaxel weekly x12 with trastuzumab to be given for 1 year  (a) echocardiogram pending  (9) adjuvant radiation as appropriate  (10) antiestrogens to follow at the completion of local treatment  (11) bone density 02/21/2012 at [facility] showed osteoporosis with a T score of -2.5; on alendronate  (12) repeat genetics testing scheduled for 02/08/2018  (12) genetics  PLAN: Randall has a new, contralateral breast cancer.   This 1 is ER positive but also HER-2 positive.  We discussed that this is very different from her prior.  She will definitely need not only trastuzumab but also some chemotherapy, and that would be paclitaxel given weekly x12.  Given that, even though these cancers tend to be more aggressive, nevertheless her prognosis will be excellent.  There are several questions.  We know that she is BRCA negative, but she needs broader testing and that has been scheduled for 0801.  We are not likely to have those results for a couple of weeks.  However even if she had a PALB 2 or similar mutation, she would still prefer not to have bilateral mastectomies but would choose intensified screening.  Accordingly there is no need to delay her surgery because of genetics questions.  She is going to meet with Dr. Dorathy Daft 02/08/2018 as well.  Dr. Donne Valentine is concerned that she may not be able to tolerate radiation on the right side.  That would suggest that she should have a mastectomy on the right, to avoid radiation.  However what she would like to  do if she cannot have radiation to the right, is go ahead and have a lumpectomy and simply omit the radiation.  I feel that this is safe in her case because she has such a good prognosis given the antiestrogen and anti-HER-2 treatment that she will receive.  Ensure she would accept a slightly higher risk of local recurrence in order to keep her breast.  In short, she is clear that she wants a lumpectomy and not unilateral or bilateral mastectomies.  We can do the chemotherapy before or after surgery.  That is usually a choice I will leave up to the surgeon and her.  At this point she seems to prefer to start with surgery and it will be up to Dr. Donne Valentine to discuss that further with her as I am comfortable either way  She is tentatively scheduled to return to see me 02/16/2018, but if she does have her surgery shortly before that we will postpone that visit  She knows to  call for any other issues that may develop before then.     Sankalp Ferrell, Virgie Dad, MD  02/02/18 8:46 AM Medical Oncology and Hematology [facility] [address] [place], [place] [address] Tel. [phone]    Fax. [phone]  Alice Rieger, am acting as scribe for Chauncey Cruel MD.  I, Lurline Del MD, have reviewed the above documentation for accuracy and completeness, and I agree with the above.

## 2018-02-02 ENCOUNTER — Other Ambulatory Visit: Payer: Self-pay | Admitting: General Surgery

## 2018-02-02 ENCOUNTER — Inpatient Hospital Stay: Payer: Medicare Other | Attending: Oncology | Admitting: Oncology

## 2018-02-02 ENCOUNTER — Telehealth: Payer: Self-pay | Admitting: Oncology

## 2018-02-02 VITALS — BP 147/79 | HR 75 | Temp 97.7°F | Resp 18 | Ht 66.0 in | Wt 118.9 lb

## 2018-02-02 DIAGNOSIS — Z79899 Other long term (current) drug therapy: Secondary | ICD-10-CM | POA: Diagnosis not present

## 2018-02-02 DIAGNOSIS — Z853 Personal history of malignant neoplasm of breast: Secondary | ICD-10-CM | POA: Insufficient documentation

## 2018-02-02 DIAGNOSIS — C3431 Malignant neoplasm of lower lobe, right bronchus or lung: Secondary | ICD-10-CM

## 2018-02-02 DIAGNOSIS — Z923 Personal history of irradiation: Secondary | ICD-10-CM | POA: Insufficient documentation

## 2018-02-02 DIAGNOSIS — C50411 Malignant neoplasm of upper-outer quadrant of right female breast: Secondary | ICD-10-CM | POA: Diagnosis not present

## 2018-02-02 DIAGNOSIS — Z87891 Personal history of nicotine dependence: Secondary | ICD-10-CM | POA: Diagnosis not present

## 2018-02-02 DIAGNOSIS — Z170 Estrogen receptor positive status [ER+]: Principal | ICD-10-CM

## 2018-02-02 DIAGNOSIS — Z9221 Personal history of antineoplastic chemotherapy: Secondary | ICD-10-CM | POA: Insufficient documentation

## 2018-02-02 DIAGNOSIS — Z85118 Personal history of other malignant neoplasm of bronchus and lung: Secondary | ICD-10-CM

## 2018-02-02 DIAGNOSIS — Z7982 Long term (current) use of aspirin: Secondary | ICD-10-CM | POA: Diagnosis not present

## 2018-02-02 DIAGNOSIS — C50212 Malignant neoplasm of upper-inner quadrant of left female breast: Secondary | ICD-10-CM

## 2018-02-02 NOTE — Telephone Encounter (Signed)
Gave patient avs and calendar of upcoming appts.  °

## 2018-02-05 ENCOUNTER — Other Ambulatory Visit: Payer: Self-pay | Admitting: General Surgery

## 2018-02-05 ENCOUNTER — Ambulatory Visit (INDEPENDENT_AMBULATORY_CARE_PROVIDER_SITE_OTHER): Payer: Medicare Other | Admitting: Internal Medicine

## 2018-02-05 ENCOUNTER — Encounter: Payer: Self-pay | Admitting: Internal Medicine

## 2018-02-05 VITALS — BP 140/80 | HR 80 | Ht 66.0 in | Wt 121.0 lb

## 2018-02-05 DIAGNOSIS — Z170 Estrogen receptor positive status [ER+]: Principal | ICD-10-CM

## 2018-02-05 DIAGNOSIS — C50411 Malignant neoplasm of upper-outer quadrant of right female breast: Secondary | ICD-10-CM

## 2018-02-05 DIAGNOSIS — C50911 Malignant neoplasm of unspecified site of right female breast: Secondary | ICD-10-CM | POA: Diagnosis not present

## 2018-02-05 NOTE — Patient Instructions (Signed)
Asked patient to call Dr. Jana Hakim for further discussion regarding upcoming treatment.

## 2018-02-05 NOTE — Progress Notes (Signed)
   Subjective:    Patient ID: Anna Valentine, female    DOB: Nov 01, 1950, 67 y.o.   MRN: [id number]  HPI Patient has a history of left breast cancer treated with lumpectomy radiation and chemotherapy in 2011.  She also has a history of right lung cancer in 2006.  There is a strong family history of  Breast cancer in patient's maternal grandmother and patient's mother.  Patient's sister was diagnosed with  cancer at age 5 and had a recurrence at age 61 and is BRCA negative.  A second sister was diagnosed at age 71.  Further genetic testing is to be done August 1.  She is Bracken negative  Patient had an abnormal diagnostic mammogram after finding a mass in her right breast.  There was a suspicious mass noted 7 cm from the nipple at the 10 o'Valentine position in the right breast.  She had an ultrasound guided core biopsy of this mass and it was ER positive, PR negative ,HER2 positive.  Pathology showed invasive ductal carcinoma with lymph node sample being negative.  She met with Dr. Jana Hakim July 26 and has tentatively been scheduled for surgery with Dr.Wakefield August 13.  She is in today with some questions.  She is wondering if she would be better off waiting for the genetic testing before having surgery.  It seems it might be a couple of weeks before the results will be back in testing is scheduled for August 1.   She tells me her husband has myelodysplastic syndrome and is apparently showing signs of early leukemia.   Pt will receive chemotherapy after surgery.  She is now thinking she might rather have bilateral mastectomies.  I think she should discuss this once again with Dr. Jana Hakim.  There is some concern she might not be able to have radiation to the right breast given her prior treatment 1.2 cm grade 3 adenocarcinoma lung cancer.  She thinks she would like to wait on the results of the genetic testing as well.    Review of Systems     Objective:   Physical Exam  Spent 20  minutes speaking with her about this issue and  encouraged her to speak with Dr. Jana Hakim further.      Assessment & Plan:  Right breast cancer-newly diagnosed earlier this month.  History of left breast cancer-invasive lobular carcinoma estrogen receptor 98% positive, progesterone receptor 6% positive HER-2 negative in 2011.  She underwent lumpectomy of the left breast.  Sentinel node biopsy showed 1 of 4 lymph nodes positive for invasive lobular carcinoma.  She was clinical stage II.  Had radiation treatment.  Treated with Arimidex for 5 years.  History of right upper lobe wedge resection and middle lobe wedge resection as well as lower lobectomy and  mediastinal lymph node dissection 2006 for a 1.2 cm grade 3 adenocarcinoma.  She is followed at [facility].  Strong family history of breast cancer in  mother, grandmother and sisters.  Plan: Patient now wishes to consider bilateral mastectomies.  She will call Dr. Jana Hakim and discuss further.  She is awaiting further genetic testing.

## 2018-02-05 NOTE — Progress Notes (Signed)
Location of Breast Cancer: suspicious mass at the 10 o'clock right upper outer position measuring 1.0 x 1.3 x 1.2 cm and located approximately 7 cm from the nipple    Histology per Pathology Report: 1. Breast, right, needle core biopsy, 10 o'clock - INVASIVE DUCTAL CARCINOMA - SEE COMMENT. 2. Lymph node, needle/core biopsy, inferior right, axillary - THERE IS NO EVIDENCE OF CARCINOMA IN 1 OF 1 LYMPH NODE (0/1). Microscopic Comment 1. The carcinoma appears at least grade II. A breast prognostic profile will be performed and the results reported separately.   Receptor Status: Estrogen Receptor: 95%, POSITIVE, STRONG STAINING INTENSITY Progesterone Receptor: 0%, NEGATIVE Proliferation Marker Ki67: 15%  Did patient present with symptoms or was this found on screening mammography?: She had diagnostic bilateral mammography with CAD and tomography and right ultrasonography on 01/25/2018 at [facility] showing: breast density category C. There was a suspicious mass at the 10 o'clock right upper outer position measuring 1.0 x 1.3 x 1.2 cm and located approximately 7 cm from the nipple. Sonographically, there was a single right axillary lymph node with mildly thickened cortex.     Past/Anticipated interventions by surgeon, if any: 01/29/18 Dr. Donne Hazel: She underwent biopsy of this right breast area ([id number]) on 01/29/2018 showing: invasive ductal carcinoma, grade II. There was no evidence of carcinoma in one right axillary lymph node--this was concordant  She talked to Dr. Donne Hazel on 02/02/2018. She would prefer intensified screening verses mastectomy. She also notes that Dr. Sondra Come and Dr. Donne Hazel are concerned about the effects of doing a lumpectomy and radiation, because of her prior right lung lobectomy.     Past/Anticipated interventions by medical oncology, if any: Chemotherapy Per Dr. Jana Hakim 7/26/19[name] has a new, contralateral breast cancer.  This 1 is ER positive but  also HER-2 positive.  We discussed that this is very different from her prior.  She will definitely need not only trastuzumab but also some chemotherapy, and that would be paclitaxel given weekly x12.  chemotherapy to consist of paclitaxel weekly x12 with trastuzumab to be given for 1 year    Lymphedema issues, if any: No  Pain issues, if any:  No  SAFETY ISSUES:  Prior radiation? Left breast 2011  Pacemaker/ICD? No  Possible current pregnancy? No  Is the patient on methotrexate? No  Current Complaints / other details:  Pt presents today for consult with Dr. Sondra Come for Radiation Oncology. Dr. Sondra Come was physician for pt for LEFT breast radiation in 2011. Pt is unaccompanied today. Pt offered SW consult for stressors including health of herself and husband, pt declined.     [name], RN 02/08/2018,1:56 PM

## 2018-02-07 DIAGNOSIS — M79671 Pain in right foot: Secondary | ICD-10-CM | POA: Diagnosis not present

## 2018-02-08 ENCOUNTER — Ambulatory Visit
Admission: RE | Admit: 2018-02-08 | Discharge: 2018-02-08 | Disposition: A | Discharge status: Home / Self Care | Payer: Medicare Other | Source: Ambulatory Visit | Attending: Radiation Oncology | Admitting: Radiation Oncology

## 2018-02-08 ENCOUNTER — Inpatient Hospital Stay: Payer: Medicare Other | Attending: Oncology | Admitting: Licensed Clinical Social Worker

## 2018-02-08 ENCOUNTER — Inpatient Hospital Stay: Payer: Medicare Other

## 2018-02-08 ENCOUNTER — Encounter: Payer: Self-pay | Admitting: Radiation Oncology

## 2018-02-08 ENCOUNTER — Encounter: Payer: Self-pay | Admitting: Licensed Clinical Social Worker

## 2018-02-08 ENCOUNTER — Other Ambulatory Visit: Payer: Self-pay

## 2018-02-08 VITALS — BP 158/83 | HR 79 | Temp 98.3°F | Resp 20 | Ht 66.0 in | Wt 120.6 lb

## 2018-02-08 DIAGNOSIS — Z7982 Long term (current) use of aspirin: Secondary | ICD-10-CM | POA: Diagnosis not present

## 2018-02-08 DIAGNOSIS — Z853 Personal history of malignant neoplasm of breast: Secondary | ICD-10-CM | POA: Diagnosis not present

## 2018-02-08 DIAGNOSIS — J449 Chronic obstructive pulmonary disease, unspecified: Secondary | ICD-10-CM | POA: Diagnosis not present

## 2018-02-08 DIAGNOSIS — Z800 Family history of malignant neoplasm of digestive organs: Secondary | ICD-10-CM | POA: Diagnosis not present

## 2018-02-08 DIAGNOSIS — J479 Bronchiectasis, uncomplicated: Secondary | ICD-10-CM | POA: Diagnosis not present

## 2018-02-08 DIAGNOSIS — Z87891 Personal history of nicotine dependence: Secondary | ICD-10-CM | POA: Insufficient documentation

## 2018-02-08 DIAGNOSIS — Z170 Estrogen receptor positive status [ER+]: Secondary | ICD-10-CM

## 2018-02-08 DIAGNOSIS — C50212 Malignant neoplasm of upper-inner quadrant of left female breast: Secondary | ICD-10-CM | POA: Diagnosis not present

## 2018-02-08 DIAGNOSIS — Z803 Family history of malignant neoplasm of breast: Secondary | ICD-10-CM | POA: Diagnosis not present

## 2018-02-08 DIAGNOSIS — Z85118 Personal history of other malignant neoplasm of bronchus and lung: Secondary | ICD-10-CM

## 2018-02-08 DIAGNOSIS — C50411 Malignant neoplasm of upper-outer quadrant of right female breast: Secondary | ICD-10-CM | POA: Diagnosis not present

## 2018-02-08 DIAGNOSIS — Z923 Personal history of irradiation: Secondary | ICD-10-CM | POA: Diagnosis not present

## 2018-02-08 DIAGNOSIS — Z801 Family history of malignant neoplasm of trachea, bronchus and lung: Secondary | ICD-10-CM

## 2018-02-08 NOTE — Progress Notes (Signed)
Radiation Oncology         [phone] ________________________________  Initial Outpatient Consultation  Name: Anna Valentine MRN: [id number]  Date: 02/08/2018  DOB: 1951-06-12  CC:[name], MD  Rolm Bookbinder, MD   REFERRING PHYSICIAN: Rolm Bookbinder, MD  DIAGNOSIS: Clinical Stage I Right Breast UOQ Invasive Ductal Carcinoma, Grade 2, ER+, PR-, Her2+  HISTORY OF PRESENT ILLNESS::Anna Valentine is a 67 y.o. female who presents to the clinic today with a new breast cancer diagnosis. The patient was seen by Dr. Jana Hakim on 12/27/17 for a routine follow up. Following that appointment, she reports palpating a lump in her right breast around the middle of July. A diagnostic mammogram performed on 01/25/18 revealed: suspicious mass the 10 o'clock location of the RIGHT breast for which biopsy is recommended; mildly prominent RIGHT axillary lymph node warranting tissue diagnosis to exclude metastatic disease. An addendum on 02/01/18 included the sonographic measurements of the mass at the 10 o'clock position approximately 7 cm from the nipple are: 1.0 x 1.3 x 1.2 cm. The following biopsy on 01/29/18 revealed invasive ductal carcinoma. She is scheduled to have a right breast lumpectomy on 02/20/18. Axillary lymph node biopsy was negative for malignancy.  She reports SOB only on exertion. She denies pain, headache, numbness, tingling, issues raising her arms.   PREVIOUS RADIATION THERAPY: Yes Stage II Invasive Lobular Carcinoma of the Left Breast; 12/01/09 - 01/28/10; Left Breast/ 50.4 Gy in 28 fractions; Axillary Supraclavicular Region/ 45 Gy; Boost 12.6  Gy to lumpectomy cavity  PAST MEDICAL HISTORY:  has a past medical history of Anemia, BRCA negative (2011), Breast cancer ([place]) (08/2009), COPD (chronic obstructive pulmonary disease) ([place]), Emphysema of lung ([place]), Family history of breast cancer, Family history of colon cancer, Family history of lung cancer, GERD (gastroesophageal reflux  disease), Lung cancer ([place]) (06/06/05), Osteoporosis, Personal history of lung cancer, and STD (sexually transmitted disease).    PAST SURGICAL HISTORY: Past Surgical History:  Procedure Laterality Date  . BREAST BIOPSY    . BREAST LUMPECTOMY  10/12/2009   Left lumpectomy and radiation, stage II, ER/PR+, Her 2 nu negative  . BUNIONECTOMY    . COLONOSCOPY    . LOBECTOMY  06/06/2005   Right  . TONSILLECTOMY    . TUBAL LIGATION  1984    FAMILY HISTORY: family history includes Allergies in her mother; Asthma in her mother; Breast cancer in her cousin; Breast cancer (age of onset: 63) in her sister; Breast cancer (age of onset: 15) in her sister; Breast cancer (age of onset: 8) in her mother; Breast cancer (age of onset: 6) in her maternal grandmother; Colon cancer in her maternal aunt; Colon cancer (age of onset: 29) in her father; Colon polyps in her sister; Leukemia in her maternal grandfather; Lung cancer in her maternal aunt and mother; Prostate cancer (age of onset: 36) in her brother; Prostate cancer (age of onset: 12) in her brother.  SOCIAL HISTORY:  reports that she quit smoking about 30 years ago. She has a 36.00 pack-year smoking history. She has never used smokeless tobacco. She reports that she drinks about 2.4 oz of alcohol per week. She reports that she does not use drugs.  ALLERGIES: Clarithromycin  MEDICATIONS:  Current Outpatient Medications  Medication Sig Dispense Refill  . acyclovir (ZOVIRAX) 400 MG tablet TAKE 1 TABLET BY MOUTH TWICE A DAY 90 tablet 0  . Calcium Carbonate (CALCIUM 600 PO) Take 600 mg by mouth daily.      Marland Kitchen  cholecalciferol (VITAMIN D) 1000 UNITS tablet Take 1,000 Units by mouth daily.    . Psyllium (METAMUCIL PO) Take by mouth daily.      . vitamin C (ASCORBIC ACID) 500 MG tablet Take 500 mg by mouth daily.    Marland Kitchen aspirin 81 MG chewable tablet Chew by mouth.     No current facility-administered medications for this encounter.     REVIEW OF SYSTEMS:  REVIEW OF SYSTEMS: A 10+ POINT REVIEW OF SYSTEMS WAS OBTAINED including neurology, dermatology, psychiatry, cardiac, respiratory, lymph, extremities, GI, GU, musculoskeletal, constitutional, reproductive, HEENT. All pertinent positives are noted in the HPI. All others are negative.   PHYSICAL EXAM:  height is _0  (1.676 m) and weight is 120 lb 9.6 oz (54.7 kg). Her oral temperature is 98.3 F (36.8 C). Her blood pressure is 158/83 (abnormal) and her pulse is 79. Her respiration is 20 and oxygen saturation is 100%.   General: Alert and oriented, in no acute distress HEENT: Head is normocephalic. Extraocular movements are intact. Oropharynx is clear. Neck: Neck is supple, no palpable cervical or supraclavicular lymphadenopathy. Heart: Regular in rate and rhythm with no murmurs, rubs, or gallops. Chest: Clear to auscultation bilaterally, with no rhonchi, wheezes, or rales. Patient has some scars along her right lateral chest from her prior lung cancer surgery. Abdomen: Soft, nontender, nondistended, with no rigidity or guarding. Extremities: No cyanosis or edema. Lymphatics: see Neck Exam Skin: No concerning lesions. Musculoskeletal: symmetric strength and muscle tone throughout. Neurologic: Cranial nerves II through XII are grossly intact. No obvious focalities. Speech is fluent. Coordination is intact. Psychiatric: Judgment and insight are intact. Affect is appropriate. Left breast: patient has tattoos in place from her previous radiation therapy. Well-healed lumpectomy scar in the UIQ. No dominate mass appreciated, no nipple discharge or bleeding. Excellent cosmetic result. Right breast: patient has some bruising in the UOQ from her recent biopsy. A firm nodule measuring approximately 1.5 x 1.5 cm is palpated in the UOQ. No nipple discharge or bleeding.    ECOG = 0  0 - Asymptomatic (Fully active, able to carry on all predisease activities without restriction)  1 - Symptomatic but  completely ambulatory (Restricted in physically strenuous activity but ambulatory and able to carry out work of a light or sedentary nature. For example, light housework, office work)  2 - Symptomatic, <50% in bed during the day (Ambulatory and capable of all self care but unable to carry out any work activities. Up and about more than 50% of waking hours)  3 - Symptomatic, >50% in bed, but not bedbound (Capable of only limited self-care, confined to bed or chair 50% or more of waking hours)  4 - Bedbound (Completely disabled. Cannot carry on any self-care. Totally confined to bed or chair)  5 - Death   Eustace Pen MM, Creech RH, Tormey DC, et al. [phone]). "Toxicity and response criteria of the [facility]". [place] Oncol. 5 (6): 649-55  LABORATORY DATA:  Lab Results  Component Value Date   WBC 3.7 (L) 11/07/2017   HGB 12.6 11/07/2017   HCT 37.1 11/07/2017   MCV 93.2 11/07/2017   PLT 304 11/07/2017   NEUTROABS 2,424 11/07/2017   Lab Results  Component Value Date   NA 141 11/07/2017   K 4.8 11/07/2017   CL 103 11/07/2017   CO2 30 11/07/2017   GLUCOSE 89 11/07/2017   CREATININE 0.64 11/07/2017   CALCIUM 9.4 11/07/2017      RADIOGRAPHY: US Breast  [place] Axilla  Addendum Date: 02/01/2018   ADDENDUM REPORT: 02/01/2018 08:54 ADDENDUM: The sonographic measurements of the mass at the 10 o'clock position approximately 7 cm from the nipple are: 1.0 x 1.3 x 1.2 cm. Electronically Signed   By: Evangeline Dakin M.D.   On: 02/01/2018 08:54   Result Date: 02/01/2018 CLINICAL DATA:  Palpable abnormality in the RIGHT breast. The patient has a history of LEFT cancer, treated with lumpectomy and radiation therapy in 2011. Patient also has a history of lung cancer. The patient's maternal grandmother was diagnosed with cancer at age 64. Patient's mother was diagnosed at age 90. The patient's sister was diagnosed at age 57 with recurrence at 65 and is BRCA negative. A  second sister was diagnosed at age 36. EXAM: DIGITAL DIAGNOSTIC RIGHT MAMMOGRAM WITH CAD AND TOMO ULTRASOUND RIGHT BREAST COMPARISON:  Previous exam(s). ACR Breast Density Category c: The breast tissue is heterogeneously dense, which may obscure small masses. FINDINGS: There is minimal asymmetry in the UPPER OUTER QUADRANT of the RIGHT breast, corresponding to the area of patient's concern. Mammographic images were processed with CAD. On physical exam, I palpate a discrete small mass in the UPPER-OUTER QUADRANT of the RIGHT breast corresponding to the area of patient's concern. Targeted ultrasound is performed, showing an oval mass with irregular margins in the 10 o'clock location RIGHT breast 7 centimeters from nipple. Mass is mildly heterogeneous, vascular and associated posterior acoustic enhancement. Evaluation of the RIGHT axilla shows a single lymph node with mildly thickened cortex (4 millimeters) warranting tissue diagnosis. IMPRESSION: 1. Suspicious mass the 10 o'clock location of the RIGHT breast for which biopsy is recommended. 2. Mildly prominent RIGHT axillary lymph node warranting tissue diagnosis to exclude metastatic disease. RECOMMENDATION: 1. Ultrasound-guided core biopsy of mass in the 10 o'clock location of the RIGHT breast. 2. Ultrasound-guided core biopsy of RIGHT axillary lymph node. 3. Biopsies have been scheduled for the patient on 01/29/2018. I have discussed the findings and recommendations with the patient. Results were also provided in writing at the conclusion of the visit. If applicable, a reminder letter will be sent to the patient regarding the next appointment. BI-RADS CATEGORY  4: Suspicious. Electronically Signed: By: Nolon Nations M.D. On: 01/25/2018 16:45   Mm Diag Breast Tomo Uni Right  Addendum Date: 02/01/2018   ADDENDUM REPORT: 02/01/2018 08:54 ADDENDUM: The sonographic measurements of the mass at the 10 o'clock position approximately 7 cm from the nipple are: 1.0 x  1.3 x 1.2 cm. Electronically Signed   By: Evangeline Dakin M.D.   On: 02/01/2018 08:54   Result Date: 02/01/2018 CLINICAL DATA:  Palpable abnormality in the RIGHT breast. The patient has a history of LEFT cancer, treated with lumpectomy and radiation therapy in 2011. Patient also has a history of lung cancer. The patient's maternal grandmother was diagnosed with cancer at age 18. Patient's mother was diagnosed at age 44. The patient's sister was diagnosed at age 55 with recurrence at 45 and is BRCA negative. A second sister was diagnosed at age 66. EXAM: DIGITAL DIAGNOSTIC RIGHT MAMMOGRAM WITH CAD AND TOMO ULTRASOUND RIGHT BREAST COMPARISON:  Previous exam(s). ACR Breast Density Category c: The breast tissue is heterogeneously dense, which may obscure small masses. FINDINGS: There is minimal asymmetry in the UPPER OUTER QUADRANT of the RIGHT breast, corresponding to the area of patient's concern. Mammographic images were processed with CAD. On physical exam, I palpate a discrete small mass in the UPPER-OUTER QUADRANT of the RIGHT  breast corresponding to the area of patient's concern. Targeted ultrasound is performed, showing an oval mass with irregular margins in the 10 o'clock location RIGHT breast 7 centimeters from nipple. Mass is mildly heterogeneous, vascular and associated posterior acoustic enhancement. Evaluation of the RIGHT axilla shows a single lymph node with mildly thickened cortex (4 millimeters) warranting tissue diagnosis. IMPRESSION: 1. Suspicious mass the 10 o'clock location of the RIGHT breast for which biopsy is recommended. 2. Mildly prominent RIGHT axillary lymph node warranting tissue diagnosis to exclude metastatic disease. RECOMMENDATION: 1. Ultrasound-guided core biopsy of mass in the 10 o'clock location of the RIGHT breast. 2. Ultrasound-guided core biopsy of RIGHT axillary lymph node. 3. Biopsies have been scheduled for the patient on 01/29/2018. I have discussed the findings and  recommendations with the patient. Results were also provided in writing at the conclusion of the visit. If applicable, a reminder letter will be sent to the patient regarding the next appointment. BI-RADS CATEGORY  4: Suspicious. Electronically Signed: By: Nolon Nations M.D. On: 01/25/2018 16:45   [place] Axillary Node Core Biopsy Right  Addendum Date: 01/30/2018   ADDENDUM REPORT: 01/30/2018 14:32 ADDENDUM: Pathology revealed GRADE II INVASIVE DUCTAL CARCINOMA of the Right breast, 10 o'clock. THERE IS NO EVIDENCE OF CARCINOMA of the inferior right axillary node. This was found to be concordant by Dr. Curlene Dolphin. Pathology results were discussed with the patient by telephone. The patient reported doing well after the biopsies with tenderness at the sites. Post biopsy instructions and care were reviewed and questions were answered. The patient was encouraged to call [facility] for any additional concerns. Surgical consultation has been arranged with Dr. Rolm Bookbinder, per patient request, at [facility] on February 15, 2018. Dr. Gunnar Bulla Magrinat was notified of biopsy results via EPIC message on January 30, 2018. Pathology results reported by Terie Purser, RN on 01/30/2018. Electronically Signed   By: Curlene Dolphin M.D.   On: 01/30/2018 14:32   Result Date: 01/30/2018 CLINICAL DATA:  Ultrasound-guided core needle biopsy was recommended a right axillary lymph node with thickened cortex. A right breast biopsy was also performed today and dictated separately. EXAM: [place] AXILLARY NODE CORE BIOPSY RIGHT COMPARISON:  Previous exam(s). FINDINGS: I met with the patient and we discussed the procedure of ultrasound-guided biopsy, including benefits and alternatives. We discussed the high likelihood of a successful procedure. We discussed the risks of the procedure, including infection, bleeding, tissue injury, clip migration, and inadequate sampling. Informed written consent was given.  The usual time-out protocol was performed immediately prior to the procedure. Using sterile technique and 1% Lidocaine as local anesthetic, under direct ultrasound visualization, a 14 gauge spring-loaded device was used to perform biopsy of a an inferior right axillary lymph node using a lateral approach. At the conclusion of the procedure a HydroMARK tissue marker clip was deployed into the biopsy cavity. Follow up 2 view mammogram was performed and dictated separately. IMPRESSION: Ultrasound guided biopsy of right axillary lymph node. No apparent complications. Electronically Signed: By: Curlene Dolphin M.D. On: 01/29/2018 16:17   Mm Clip Placement Right  Result Date: 01/29/2018 CLINICAL DATA:  Biopsies were performed of a right breast mass at 10 o'clock position and a right axillary lymph node. EXAM: DIAGNOSTIC RIGHT MAMMOGRAM POST ULTRASOUND BIOPSY COMPARISON:  Previous exam(s). FINDINGS: Mammographic images were obtained following ultrasound guided biopsies of a right breast mass at 10 o'clock position and of a right axillary lymph node. Ribbon shaped biopsy clip is  satisfactorily positioned within the biopsied mass. The right axillary lymph node biopsy clip is not included on the standard mammogram images. Dedicated focal spot compression views of the right axillary region are performed. [facility] biopsy clip is not visualized mammographically. However, on the images for the ultrasound-guided biopsy of the right axilla, arrows were placed at site of the visualized biopsy clip. IMPRESSION: Satisfactory position of ribbon shaped biopsy clip in the biopsied right breast mass. The [facility] biopsy clip placed along the lateral edge of the biopsied right axillary lymph node is not visualized on the mammogram. This clip is best visualized by ultrasound given its position. Final Assessment: Post Procedure Mammograms for Marker Placement Electronically Signed   By: Curlene Dolphin M.D.   On: 01/29/2018 16:24    [place] Rt Breast Bx W Loc Dev 1st Lesion Img Bx Spec US Guide  Addendum Date: 01/30/2018   ADDENDUM REPORT: 01/30/2018 14:33 ADDENDUM: Pathology revealed GRADE II INVASIVE DUCTAL CARCINOMA of the Right breast, 10 o'clock. THERE IS NO EVIDENCE OF CARCINOMA of the inferior right axillary node. This was found to be concordant by Dr. Curlene Dolphin. Pathology results were discussed with the patient by telephone. The patient reported doing well after the biopsies with tenderness at the sites. Post biopsy instructions and care were reviewed and questions were answered. The patient was encouraged to call [facility] for any additional concerns. Surgical consultation has been arranged with Dr. Rolm Bookbinder, per patient request, at [facility] on February 15, 2018. Dr. Gunnar Bulla Magrinat was notified of biopsy results via EPIC message on January 30, 2018. Pathology results reported by Terie Purser, RN on 01/30/2018. Electronically Signed   By: Curlene Dolphin M.D.   On: 01/30/2018 14:33   Result Date: 01/30/2018 CLINICAL DATA:  67 year old patient presents for biopsy of a suspicious palpable mass in the 10 o'clock region of the right breast approximately 7 cm from the nipple. She has a personal history of left breast cancer and right lung cancer. EXAM: ULTRASOUND GUIDED RIGHT BREAST CORE NEEDLE BIOPSY COMPARISON:  Previous exam(s). FINDINGS: I met with the patient and we discussed the procedure of ultrasound-guided biopsy, including benefits and alternatives. We discussed the high likelihood of a successful procedure. We discussed the risks of the procedure, including infection, bleeding, tissue injury, clip migration, and inadequate sampling. Informed written consent was given. The usual time-out protocol was performed immediately prior to the procedure. Lesion quadrant: Upper outer quadrant Using sterile technique and 1% Lidocaine as local anesthetic, under direct ultrasound visualization,  a 12 gauge spring-loaded device was used to perform biopsy of an irregular palpable 1.3 cm mass at 10 o'clock position using a lateral approach. At the conclusion of the procedure a ribbon shaped tissue marker clip was deployed into the biopsy cavity. Follow up 2 view mammogram was performed and dictated separately. IMPRESSION: Ultrasound guided biopsy of the right breast. No apparent complications. Electronically Signed: By: Curlene Dolphin M.D. On: 01/29/2018 16:16      IMPRESSION: Clinical Stage I Right Breast UOQ Invasive Ductal Carcinoma, Grade 2, ER+, PR-, Her2+ I have personally reviewed her information from her previous left breast radiation therapy, as it pertains to her new diagnosis. I have also reviewed her previous records concerning her lung cancer and surgery with right lower lobectomy. Patient does have bronchiectasis, most significant in the right lung. This, however, has been stable of the past few years. She is breathing quite well and has never required any supplemental  oxygen. Interestingly, the patient's heart has shifted significantly into the right chest with her right lower lobectomy (noted on recent chest CT scans). And with traditional tangent fields, the heart could potentially be treated. But with breath-hold/cardiac-sparing techniques, we should be able to avoid treatment of the heart with her anticipated radiation fields.  Patient has recently undergone additional genetic testing with results pending at this time. She did mention that she would likely consider bilateral mastectomy if her testing is positive for a pathogenic mutation, especially since this current problem is her third malignancy.  In summary, the patient would be a candidate for lumpectomy and adjuvant radiotherapy for her new right-sided breast cancer. She did meet with Dr. Jana Hakim, who mentioned that given her tumor characteristics, she might do well with just a lumpectomy alone.   PLAN: The patient will meet  with Dr. Donne Hazel early next week for further evaluation,  input from radiation oncology and medical oncology complete. Genetic testing results are pending at this time.      ------------------------------------------------  Blair Promise, PhD, MD  This document serves as a record of services personally performed by [name], MD. It was created on his behalf by Wilburn Mylar, a trained medical scribe. The creation of this record is based on the scribe's personal observations and the provider's statements to them. This document has been checked and approved by the attending provider.

## 2018-02-08 NOTE — Progress Notes (Signed)
REFERRING PROVIDER: Chauncey Cruel, MD [address] [place],  [address]  PRIMARY PROVIDER:  [name], MD  PRIMARY REASON FOR VISIT:  1. Malignant neoplasm of upper-inner quadrant of left breast in female, estrogen receptor positive ([facility])   2. Family history of breast cancer   3. Family history of lung cancer   4. Personal history of lung cancer   5. Family history of colon cancer      HISTORY OF PRESENT ILLNESS:   Anna Valentine, a 67 y.o. female, was seen for a [facility] cancer genetics consultation at the request of Dr. Jana Hakim due to a personal and family history of cancer.  Anna Valentine presents to clinic today to discuss the possibility of a hereditary predisposition to cancer, genetic testing, and to further clarify her future cancer risks, as well as potential cancer risks for family members.   In 2019, at the age of 79, Anna Valentine was diagnosed with invasive ductal carcinoma of the right breast, ER+, PR-, HER2+. Treatment is being determined.  In 2011, at the age of 44, Anna Valentine was diagnosed with invasive lobular carcinoma of the left breast, ER+, PR+, HER2-.This was treated with lumpectomy and radiation.  In 2006 at the age of 44, Anna Valentine was diagnosed with lung cancer. This was treated with lobectomy.   HORMONAL RISK FACTORS:  Menarche was at age 53.  OCP use for approximately 4 years.  Ovaries intact: yes.  Hysterectomy: no.  Menopausal status: postmenopausal.  HRT use: 0 years. Colonoscopy: yes; normal. Mammogram within the last year: yes. Number of breast biopsies: 2.  Past Medical History:  Diagnosis Date  . Anemia   . BRCA negative 2011   BRCA I/ II negative  . Breast cancer ([facility]) 08/2009   stage 2, rx with lumpectomy and xrt  . COPD (chronic obstructive pulmonary disease) ([facility])    pt.unsure of diagnosis status  . Emphysema of lung ([facility])    pt. questions diagnosis  . Family history of breast cancer   . Family history of colon  cancer   . Family history of lung cancer   . GERD (gastroesophageal reflux disease)    not presently having symptoms  . Lung cancer ([facility]) 06/06/05   stage 1 poorly differentiated adenocarcinoma, s/p right lower lobectomy.  . Osteoporosis   . Personal history of lung cancer   . STD (sexually transmitted disease)    HSV    Past Surgical History:  Procedure Laterality Date  . BREAST BIOPSY    . BREAST LUMPECTOMY  10/12/2009   Left lumpectomy and radiation, stage II, ER/PR+, Her 2 nu negative  . BUNIONECTOMY    . COLONOSCOPY    . LOBECTOMY  06/06/2005   Right  . TONSILLECTOMY    . TUBAL LIGATION  1984    Social History   Socioeconomic History  . Marital status: Married    Spouse name: Not on file  . Number of children: 0  . Years of education: Not on file  . Highest education level: Not on file  Occupational History  . Occupation: OFFICE MANAGER    Employer: [employer]  Social Needs  . Financial resource strain: Not on file  . Food insecurity:    Worry: Not on file    Inability: Not on file  . Transportation needs:    Medical: Not on file    Non-medical: Not on file  Tobacco Use  . Smoking status: Former Smoker    Packs/day: 2.00  Years: 18.00    Pack years: 36.00    Last attempt to quit: 07/12/1987    Years since quitting: 30.6  . Smokeless tobacco: Never Used  Substance and Sexual Activity  . Alcohol use: Yes    Alcohol/week: 2.4 oz    Types: 4 Standard drinks or equivalent per week    Comment: 2 per day  . Drug use: No  . Sexual activity: Yes  Lifestyle  . Physical activity:    Days per week: Not on file    Minutes per session: Not on file  . Stress: Not on file  Relationships  . Social connections:    Talks on phone: Not on file    Gets together: Not on file    Attends religious service: Not on file    Active member of club or organization: Not on file    Attends meetings of clubs or organizations: Not on file    Relationship status: Not on  file  Other Topics Concern  . Not on file  Social History Narrative  . Not on file     FAMILY HISTORY:  We obtained a detailed, 4-generation family history.  Significant diagnoses are listed below: Family History  Problem Relation Age of Onset  . Allergies Mother   . Asthma Mother   . Lung cancer Mother   . Breast cancer Mother 61       recurrence age 66  . Colon cancer Father 76  . Prostate cancer Brother 48  . Breast cancer Sister 52       Recurrence age 57 BRCA negative  . Colon polyps Sister   . Breast cancer Sister 57  . Prostate cancer Brother 63  . Breast cancer Maternal Grandmother 70  . Colon cancer Maternal Aunt   . Leukemia Maternal Grandfather   . Lung cancer Maternal Aunt   . Breast cancer Cousin   . Esophageal cancer Neg Hx   . Rectal cancer Neg Hx   . Stomach cancer Neg Hx    Anna Valentine does not have children. She has two sisters and two brothers. One sister was diagnosed with breast cancer at 52 with recurrence at 57, and is living at 68. The other sister was diagnosed with breast cancer at 57 and is living at 60. One brother was diagnosed with prostate cancer at 48 and died at 50. He also had a history of melanoma. Her other brother is still living at 70 with a history of prostate cancer at 63. Anna Valentine has three nieces and four nephews.   Anna Valentine's father was diagnosed with colon cancer at 76 and passed away at 76. He had a sister and two brothers, all are deceased. His sister had two daughters (Ms. Schewe's paternal cousins) who had breast cancer. Anna Valentine does not know how old her paternal grandparents were when they died or what they died from.   Anna Valentine's mother was diagnosed with breast cancer at 61 in one breast and at 66 in the other breast. She died at 78. She had two sisters and a brother. One of Anna Valentine's maternal aunts had lung cancer and died at 80, and the other maternal aunt had colon cancer diagnosed at 77 and died at 78. No cancer in her  maternal cousins. Her maternal grandfather had leukemia and died at 74. Her maternal grandmother had breast cancer at 70 and died at 72.   Anna Valentine is aware of previous family history of genetic testing for   hereditary cancer risks. In 2011, she had BRCA1/2 testing and was found to be negative. Her sister was also tested and found to be negative. Patient's maternal ancestors are of [place] descent, and paternal ancestors are of Irish/German/French descent. There is no reported Ashkenazi Jewish ancestry. There is no known consanguinity.  GENETIC COUNSELING ASSESSMENT: Anna Valentine is a 67 y.o. female with a personal and family history which is somewhat suggestive of a Hereditary Cancer Predisposition Syndrome. We, therefore, discussed and recommended the following at today's visit.   DISCUSSION: We reviewed the characteristics, features and inheritance patterns of hereditary cancer syndromes. We also discussed genetic testing, including the appropriate family members to test, the process of testing, insurance coverage and turn-around-time for results. We discussed the implications of a negative, positive and/or variant of uncertain significant result. In order to get genetic test results in a timely manner so that Anna Valentine can use these genetic test results for surgical decisions, we recommended Anna Valentine pursue genetic testing for the [facility] STAT Breast Cancer Panel. If this test is negative, we then recommend Anna Valentine pursue reflex genetic testing to the [facility] Common Hereditary Cancers gene panel.  The STAT Breast cancer panel offered by [facility] includes sequencing and rearrangement analysis for the following 9 genes:  ATM, BRCA1, BRCA2, CDH1, CHEK2, PALB2, PTEN, STK11 and TP53.    The Common Hereditary Cancer Panel offered by [facility] includes sequencing and/or deletion duplication testing of the following 47 genes: APC, ATM, AXIN2, BARD1, BMPR1A, BRCA1, BRCA2, BRIP1, CDH1, CDKN2A (p14ARF), CDKN2A  (p16INK4a), CKD4, CHEK2, CTNNA1, DICER1, EPCAM (Deletion/duplication testing only), GREM1 (promoter region deletion/duplication testing only), KIT, MEN1, MLH1, MSH2, MSH3, MSH6, MUTYH, NBN, NF1, NHTL1, PALB2, PDGFRA, PMS2, POLD1, POLE, PTEN, RAD50, RAD51C, RAD51D, SDHB, SDHC, SDHD, SMAD4, SMARCA4. STK11, TP53, TSC1, TSC2, and VHL.  The following genes were evaluated for sequence changes only: SDHA and HOXB13 c.251G>A variant only.  We discussed that only 5-10% of cancers are associated with a Hereditary cancer predisposition syndrome.  One of the most common hereditary cancer syndromes that increases breast cancer risk is called Hereditary Breast and Ovarian Cancer (HBOC) syndrome.  This syndrome is caused by mutations in the BRCA1 and BRCA2 genes, which she has already tested negative for.   There are also many other cancer predisposition syndromes caused by mutations in several other genes.   We discussed that if she is found to have a mutation in one of these genes, it may impact surgical decisions, and alter future medical management recommendations such as increased cancer screenings and consideration of risk reducing surgeries.  A positive result could also have implications for the patient's family members.  A Negative result would mean we were unable to identify a hereditary component to her cancer, but does not rule out the possibility of a hereditary basis for her cancer.  There could be mutations that are undetectable by current technology, or in genes not yet tested or identified to increase cancer risk.    We discussed the potential to find a Variant of Uncertain Significance or VUS.  These are variants that have not yet been identified as pathogenic or benign, and it is unknown if this variant is associated with increased cancer risk or if this is a normal finding.  Most VUS's are reclassified to benign or likely benign.   It should not be used to make medical management decisions. With time,  we suspect the lab will determine the significance of any VUS's identified if any.  Based on Anna Valentine's personal and family history of cancer, she meets medical criteria for genetic testing. Despite that she meets criteria, she may still have an out of pocket cost.   PLAN: After considering the risks, benefits, and limitations, Ms. Virts  provided informed consent to pursue genetic testing and the blood sample was sent to [facility] for analysis of the STAT Breast Cancer Panel with reflex to the Common Hereditary Cancers Panel. Results for the STAT panel should be available within approximately 5-10 days time, at which point they will be disclosed by telephone to Ms. Oblinger, as will any additional recommendations warranted by these results. Ms. Ketcher will receive a summary of her genetic counseling visit and a copy of her results once available. This information will also be available in Epic. We encouraged Ms. Greth to remain in contact with cancer genetics annually so that we can continuously update the family history and inform her of any changes in cancer genetics and testing that may be of benefit for her family. Ms. Lainez questions were answered to her satisfaction today. Our contact information was provided should additional questions or concerns arise.  Based on Ms. Harpenau's family history, we recommended her siblings have genetic counseling and testing. Ms. Rauls will let us know if we can be of any assistance in coordinating genetic counseling and/or testing for these family members.  Lastly, we encouraged Ms. Sheen to remain in contact with cancer genetics annually so that we can continuously update the family history and inform her of any changes in cancer genetics and testing that may be of benefit for this family.   Ms.  Bohnet questions were answered to her satisfaction today. Our contact information was provided should additional questions or concerns arise. Thank you for the  referral and allowing [place] to share in the care of your patient.   Epimenio Foot, MS Genetic Counselor [email or web address] Phone: [phone]  The patient was seen for a total of 35 minutes in face-to-face genetic counseling.  This patient was discussed with Drs. Magrinat, Lindi Adie and/or Burr Medico who agrees with the above.

## 2018-02-09 ENCOUNTER — Telehealth: Payer: Self-pay | Admitting: *Deleted

## 2018-02-09 NOTE — Telephone Encounter (Signed)
This RN spoke with pt prior to her schedule genetic appointment due to her concern for scheduled surgery on 8/12 "and the more I think about it I am leaning towards getting a mastectomy not a lumpectomy "  " I am ready to be done with this cancer "  This RN discussed above with pt - and to not cancel any surgical dates at this time - she will follow up with genetics and will await review by Dr Jannifer Rodney - though she is hoping to hear his opinion prior to scheduled follow up on 8/9.  She is aware MD is out of the office presently.  This note will be given to MD for review and possible contact regarding her concerns.

## 2018-02-11 ENCOUNTER — Other Ambulatory Visit: Payer: Self-pay | Admitting: Oncology

## 2018-02-12 ENCOUNTER — Other Ambulatory Visit (HOSPITAL_COMMUNITY): Payer: Medicare Other

## 2018-02-13 ENCOUNTER — Telehealth: Payer: Self-pay | Admitting: *Deleted

## 2018-02-13 DIAGNOSIS — C50411 Malignant neoplasm of upper-outer quadrant of right female breast: Secondary | ICD-10-CM | POA: Diagnosis not present

## 2018-02-13 NOTE — Telephone Encounter (Signed)
Pt informed Dr. Donne Hazel she did not need to keep appt with Dr. Jana Hakim on 8/9. Appt cx per pt request

## 2018-02-16 ENCOUNTER — Ambulatory Visit: Payer: Medicare Other | Admitting: Oncology

## 2018-02-19 ENCOUNTER — Inpatient Hospital Stay: Admission: RE | Admit: 2018-02-19 | Payer: Medicare Other | Source: Ambulatory Visit

## 2018-02-20 ENCOUNTER — Encounter (HOSPITAL_COMMUNITY): Payer: Medicare Other

## 2018-02-20 ENCOUNTER — Telehealth: Payer: Self-pay | Admitting: Licensed Clinical Social Worker

## 2018-02-20 ENCOUNTER — Encounter: Payer: Self-pay | Admitting: Licensed Clinical Social Worker

## 2018-02-20 DIAGNOSIS — Z1379 Encounter for other screening for genetic and chromosomal anomalies: Secondary | ICD-10-CM | POA: Insufficient documentation

## 2018-02-20 NOTE — Telephone Encounter (Signed)
Fascinating. Thanks.

## 2018-02-20 NOTE — Telephone Encounter (Signed)
Revealed positive genetic test result. 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 We are still awaiting the results from the Common Hereditary Cancers Panel.  We discussed that this result explains her cancer history. We also discussed that TP53 mutations are rare, affecting approximately 1 in 5000 individuals, but are highly penetrant mutations that significantly increase the risk for cancer.  These mutations are causative for Li-Fraumeni Syndrome (LFS).  The lifetime risk for cancer in women approaches 100%, primarily for the risk for breast cancer, and the lifetime risk for cancer in men is approximately 73%. Li-Fraumeni syndrome increases the risk for soft tissue sarcomas, osteosarcomas, pre-menopausal breast cancer in women, brain tumors, adrenocortical carcinoma, and leukemias.  These cancers can occur in childhood, and throughout adulthood, with approximately 50% of individuals being diagnosed with cancer by age 10 and 90% by age 5.  We discussed the importance of letting other family members know of this result.   Of note for her current cancer treatment: Because of the increased risk for cancer, avoiding risks that can increase that risk is recommended.  Therefore, limiting sun exposure and using sunscreen, and eliminating tobacco use and exposure to other known or suspected carcinogens is encouraged. There is evidence of increased sensitivity to ionizing radiation.  Therefore individuals should avoid or minimize exposure to diagnostic and therapeutic radiation whenever possible, to reduce the risk of secondary radiation-induced malignancies is encouraged.  I have scheduled [name] for a follow-up appointment with me on Thursday August 15th at 1 PM to discuss further.

## 2018-02-21 ENCOUNTER — Other Ambulatory Visit: Payer: Self-pay

## 2018-02-21 ENCOUNTER — Encounter (HOSPITAL_BASED_OUTPATIENT_CLINIC_OR_DEPARTMENT_OTHER): Payer: Self-pay | Admitting: *Deleted

## 2018-02-22 ENCOUNTER — Inpatient Hospital Stay (HOSPITAL_BASED_OUTPATIENT_CLINIC_OR_DEPARTMENT_OTHER): Payer: Medicare Other | Admitting: Licensed Clinical Social Worker

## 2018-02-22 DIAGNOSIS — C50411 Malignant neoplasm of upper-outer quadrant of right female breast: Secondary | ICD-10-CM

## 2018-02-22 DIAGNOSIS — Z803 Family history of malignant neoplasm of breast: Secondary | ICD-10-CM

## 2018-02-22 DIAGNOSIS — Z1379 Encounter for other screening for genetic and chromosomal anomalies: Secondary | ICD-10-CM

## 2018-02-22 DIAGNOSIS — Z801 Family history of malignant neoplasm of trachea, bronchus and lung: Secondary | ICD-10-CM

## 2018-02-22 DIAGNOSIS — Z85118 Personal history of other malignant neoplasm of bronchus and lung: Secondary | ICD-10-CM

## 2018-02-22 DIAGNOSIS — Z800 Family history of malignant neoplasm of digestive organs: Secondary | ICD-10-CM

## 2018-02-22 NOTE — Progress Notes (Signed)
GENETIC TEST RESULTS   Patient Name: Anna Valentine Patient Age: 67 y.o. Encounter Date: 02/22/2018  REFERRING PROVIDER: Chauncey Cruel, MD [address] [place], [place] [address]   [name] was seen in the [facility] clinic on 02/08/2018 due to a personal and family history of cancer and concern regarding a hereditary predisposition to cancer in the family. Please refer to the prior Genetics clinic note for more information regarding [name]'s medical and family histories and our assessment at the time.   FAMILY HISTORY:  We obtained a detailed, 4-generation family history.  Significant diagnoses are listed below: Family History  Problem Relation Age of Onset  . Allergies Mother   . Asthma Mother   . Lung cancer Mother   . Breast cancer Mother 69       recurrence age 55  . Colon cancer Father 81  . Prostate cancer Brother 51  . Breast cancer Sister 52       Recurrence age 45 BRCA negative  . Colon polyps Sister   . Breast cancer Sister 13  . Prostate cancer Brother 63  . Breast cancer Maternal Grandmother 51  . Colon cancer Maternal Aunt   . Leukemia Maternal Grandfather   . Lung cancer Maternal Aunt   . Breast cancer Cousin   . Esophageal cancer Neg Hx   . Rectal cancer Neg Hx   . Stomach cancer Neg Hx    [name] does not have children. She has two sisters and two brothers. One sister was diagnosed with breast cancer at 63 with recurrence at 60, and is living at 82. The other sister was diagnosed with breast cancer at 32 and is living at 27. One brother was diagnosed with prostate cancer at 77 and died at 50. He also had a history of melanoma. Her other brother is still living at 15 with a history of prostate cancer at 48. [name] has three nieces and four nephews.   [name] father was diagnosed with colon cancer at 25 and passed away at 8. He had a sister and two brothers, all are deceased. His sister had two daughters ([name] paternal  cousins) who had breast cancer. [name] does not know how old her paternal grandparents were when they died or what they died from.   [name] mother was diagnosed with breast cancer at 68 in one breast and at 23 in the other breast. She died at 62. She had two sisters and a brother. One of [name]'s maternal aunts had lung cancer and died at 84, and the other maternal aunt had colon cancer diagnosed at 61 and died at 77. No cancer in her maternal cousins. Her maternal grandfather had leukemia and died at 83. Her maternal grandmother had breast cancer at 41 and died at 93.   [name] is aware of previous family history of genetic testing for hereditary cancer risks. In 2011, she had BRCA1/2 testing and was found to be negative. Her sister was also tested and found to be negative. Patient's maternal ancestors are of [place] descent, and paternal ancestors are of Irish/German/French descent. There is no reported Ashkenazi Jewish ancestry. There is no known consanguinity.  GENETIC TESTING:  At the time of 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 STK11, TSC1, TSC2, and VHL.  The following genes were evaluated for sequence changes only: SDHA and HOXB13 c.251G>A variant only.    TP53: We discussed that TP53 mutations are rare, affecting approximately 1 in 5000  individuals, but are highly penetrant mutations that significantly increase the risk for cancer.  These mutations are causative for Li-Fraumeni Syndrome (LFS).  The lifetime risk for cancer in women approaches 100%, primarily for the risk for breast cancer, and the lifetime risk for cancer in men is approximately 73%. Li-Fraumeni syndrome increases the risk for soft tissue sarcomas, osteosarcomas, pre-menopausal breast cancer in women, brain tumors, adrenocortical carcinoma, and leukemias.  These cancers can occur in childhood, and throughout adulthood, with approximately 50% of individuals being diagnosed with cancer by age 104 and 90% by age 33. We discussed that [name]'s family is not a classic Li-Fraumeni family, however this result is still important to share with family members as we cannot predict how it will affect other individuals and it is still important for [name] to have additional screening.   THINGS TO AVOID: Because of the increased risk for cancer, avoiding risks that can increase that risk is recommended.  Therefore, limiting sun exposure and using sunscreen, and eliminating tobacco use and exposure to other known or suspected carcinogens is encouraged. There is evidence of increased sensitivity to ionizing radiation.  Therefore individuals should avoid or minimize exposure to diagnostic and therapeutic radiation whenever possible, to reduce the risk of secondary radiation-induced malignancies is encouraged.  CANCER SURVEILLANCE:The following is recommended for following individuals with LFS includes:  1. Children and adults should undergo comprehensive annual physical examination including careful skin and neurological exams with the focus on risks for rare early onset cancer and second malignancies in cancer survivors. 2. To reduce the risk for breast cancer, prophylactic bilateral mastectomy is the most effective option. However, for women who choose to keep their breasts, we  recommend yearly breast MRI, twice-yearly clinical breast exams through a high-risk clinic, and monthly self-breast exams. The use of mammography is controversial based on the radiation exposure.  When included, annual mammograms should alternate with breast MRI, with one modality every six months.    3. To reduce the risk for colon cancer, adults should consider colonoscopy every 2-5 years beginning no later than age 23 years.   4. Whole body MRI with brain MRI annually.  It may be helpful to participate in a Li-Fraumeni Syndrome clinic so that updates on screening and surveillance for the condition may be conveyed.  FAMILY MEMBERS: It is important that all of Ms. Scavone's relatives (both men and women) know of the presence of this gene mutation. Site-specific genetic testing can sort out who in the family is at risk and who is not.   Ms. Izard siblings have a 50% chance to have inherited this mutation. We recommend they have genetic testing for this same mutation, as identifying the presence of this mutation would allow them to also take advantage of risk-reducing measures. We also recommended her cousins on both side of the family have testing. Since her parents, aunts and uncles are no longer living, her cousins are the only family members who could possibly  show [place] which side of the family this is coming from.   PLAN:  1. These results will be made available to her care team, Dr. Jana Hakim, Dr. Donne Hazel and Dr. Sondra Come. She expressed uncertainty regarding what to do about her upcoming surgery. She said she was feeling at peace about doing the lumpectomy but now she feels like she is back at square one.   2. She also requested these results be shared with Dr. Renold Genta, Dr. Milus Banister and Dr. Havery Moros. She is unsure who she would like to follow her long-term for this indication. I gave her information from the Li-Fraumeni Syndrome Association and recommended that she find a Li-Fraumeni clinic that she can  participate in. I let her know that there are clinics in [place] and [place] that do whole body MRIs.  She said she would look into this, but just wants to get through her breast cancer first.    3. Lastly,  Ms. Siler plans to discuss these results with her family and will reach out to [place] if we can be of any assistance in coordinating genetic testing for any of her relatives.   SUPPORT AND RESOURCES: If Ms. Malachowski is interested in TP53-specific information and support, there is one group, Li-Fraumeni Syndrome Association (LFSA) (http://www.lfsassociation.org/) which some people have found useful. They provide opportunities to speak with other individuals from high-risk families. To locate genetic counselors in other cities, visit the website of the [employer] (ArtistMovie.se) and Secretary/administrator for a Social worker by zip code.  We encouraged Ms. Mandujano to remain in contact with [place] on an annual basis so we can update her personal and family histories, and let her know of advances in cancer genetics that may benefit the family. Our contact number was provided. Ms. Fonseca questions were answered to her satisfaction today, and she knows she is welcome to call anytime with additional questions.   Epimenio Foot, MS Genetic Counselor [email or web address] Phone: [phone]

## 2018-02-26 ENCOUNTER — Other Ambulatory Visit: Payer: Self-pay | Admitting: Oncology

## 2018-02-26 MED ORDER — TAMOXIFEN CITRATE 20 MG PO TABS: 20.0000 mg | ORAL_TABLET | Freq: Every day | ORAL | 12 refills | Status: DC

## 2018-02-26 NOTE — Progress Notes (Signed)
I called the patient and we discussed her genetic findings.  Because she likely now will need mastectomy and reconstruction there will be some surgical delay.  She took anastrozole before.  We are going to give tamoxifen a try.  I have called in the prescriptions.  If she has any problems from this we will go back to anastrozole.

## 2018-02-27 ENCOUNTER — Encounter: Payer: Self-pay | Admitting: Internal Medicine

## 2018-02-27 ENCOUNTER — Ambulatory Visit (INDEPENDENT_AMBULATORY_CARE_PROVIDER_SITE_OTHER): Payer: Medicare Other | Admitting: Internal Medicine

## 2018-02-27 VITALS — BP 140/80 | HR 76 | Temp 98.3°F | Ht 66.0 in | Wt 120.0 lb

## 2018-02-27 DIAGNOSIS — Z923 Personal history of irradiation: Secondary | ICD-10-CM | POA: Diagnosis not present

## 2018-02-27 DIAGNOSIS — Z853 Personal history of malignant neoplasm of breast: Secondary | ICD-10-CM | POA: Diagnosis not present

## 2018-02-27 DIAGNOSIS — C50211 Malignant neoplasm of upper-inner quadrant of right female breast: Secondary | ICD-10-CM | POA: Diagnosis not present

## 2018-02-27 DIAGNOSIS — Z170 Estrogen receptor positive status [ER+]: Secondary | ICD-10-CM | POA: Diagnosis not present

## 2018-02-27 DIAGNOSIS — C50911 Malignant neoplasm of unspecified site of right female breast: Secondary | ICD-10-CM | POA: Diagnosis not present

## 2018-02-27 NOTE — Progress Notes (Signed)
   Subjective:    Patient ID: Anna Valentine, female    DOB: 06/07/1951, 67 y.o.   MRN: [id number]  HPI  67 year old Female in today to discuss recent genetic testing done by Epimenio Foot at [facility].  Patient was found to have a TP53 mutation causative for Li-Fraumeni Syndrome. Has associated increase risk for soft tissue sarcomas, brain tumors, adrenocortical carcinoma, leukemia, premenopausal breast cancer. Recently dx with right breast invasive ductal carcinoma. Lymph node sampling negative. Has met with Dr. Donne Hazel and Dr. Leland Johns. Concern for her is whether to have bilateral mastectomies or not. Is not interested in reconstructive breast surgery. Has been told could have issues with infection left breast if has mastectomy because of previous radiation therapy. Concerned about how she will be monitored in the future and how radiation affects that monitoring ie ? increase risk for cancer development in the future. Is monitored at [facility] for hx lung cancer in remote past.  I feel she needs to meet with Dr. Jana Hakim and come to a decision about her treatment soon.  Spent 25 minutes speaking with her about this today.        Review of Systemssee above     Objective:   Physical Exam  Not examined see discussion above      Assessment & Plan:  History of left breat cancer s/p lumpectomy, radiation and chemotherapy 2011  Hx lung cancer- followed at [facility] dx right breast cancer(invasive ductal carcinoma with negative node sampling ER+ PR - HER2+)  ---has seen Dr. Donne Hazel and Dr. Jana Hakim. Options have been discussed with her but she seems to be struggling with current treatment options.  Situational stress with husband who apparently has myelodysplasia  Genetic mutation increasing risk of cancer TP53  Plan: Encouraged her to discuss further with Dr. Jana Hakim

## 2018-02-27 NOTE — Patient Instructions (Addendum)
Please call Dr. Jana Hakim for further discussion of genetic testing results and to get advive about breast cancer treatment

## 2018-02-28 ENCOUNTER — Inpatient Hospital Stay: Admission: RE | Admit: 2018-02-28 | Payer: Medicare Other | Source: Ambulatory Visit

## 2018-02-28 ENCOUNTER — Telehealth: Payer: Self-pay

## 2018-02-28 NOTE — Telephone Encounter (Signed)
-----  Message from Yetta Flock, MD sent at 02/28/2018  8:18 AM EDT ----- Mary Sella this patient needs a routine follow up visit to discuss frequency of colonoscopies based on her recent genetic testing. If you can help coordinate. Thanks  ----- Message ----- From: York Spaniel Sent: 02/22/2018   2:34 PM EDT To: Yetta Flock, MD  Genetic counseling results session. Positive for TP53. Ms. Chandran requested these results be sent to you.

## 2018-02-28 NOTE — Telephone Encounter (Signed)
Pt called back. Scheduled for this Friday, 8-23.

## 2018-02-28 NOTE — Telephone Encounter (Signed)
Thanks Jan 

## 2018-02-28 NOTE — Telephone Encounter (Signed)
Called pt and left message to call back to schedule appt.to discuss frequency of colons based on her recent genetic testing.

## 2018-03-01 ENCOUNTER — Ambulatory Visit (HOSPITAL_BASED_OUTPATIENT_CLINIC_OR_DEPARTMENT_OTHER): Admission: RE | Admit: 2018-03-01 | Payer: Medicare Other | Source: Ambulatory Visit | Admitting: General Surgery

## 2018-03-01 ENCOUNTER — Other Ambulatory Visit (HOSPITAL_COMMUNITY): Payer: Medicare Other

## 2018-03-01 SURGERY — BREAST LUMPECTOMY WITH RADIOACTIVE SEED AND SENTINEL LYMPH NODE BIOPSY
Anesthesia: General | Site: Breast | Laterality: Right

## 2018-03-02 ENCOUNTER — Encounter: Payer: Self-pay | Admitting: Gastroenterology

## 2018-03-02 ENCOUNTER — Other Ambulatory Visit: Payer: Self-pay | Admitting: Internal Medicine

## 2018-03-02 ENCOUNTER — Ambulatory Visit (INDEPENDENT_AMBULATORY_CARE_PROVIDER_SITE_OTHER): Payer: Medicare Other | Admitting: Gastroenterology

## 2018-03-02 VITALS — BP 118/80 | HR 72 | Ht 66.0 in | Wt 119.1 lb

## 2018-03-02 DIAGNOSIS — Z1501 Genetic susceptibility to malignant neoplasm of breast: Secondary | ICD-10-CM | POA: Diagnosis not present

## 2018-03-02 DIAGNOSIS — Z1502 Genetic susceptibility to malignant neoplasm of ovary: Secondary | ICD-10-CM | POA: Diagnosis not present

## 2018-03-02 DIAGNOSIS — Z1509 Genetic susceptibility to other malignant neoplasm: Secondary | ICD-10-CM | POA: Diagnosis not present

## 2018-03-02 NOTE — Progress Notes (Signed)
HPI :  67 year old female known to me from prior colonoscopy, here for follow-up visit in light of recent genetic testing. She is a history of lung cancer, history of left breast cancer in 2011, history of recent diagnosis of right breast cancer in July 2019. 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  Genetics recommended consideration for colonoscopy every 2-5 years starting at age 75.   She reports feeling well at baseline. She denies any blood in her stools. She denies any changes in her bowels habits. She denies any abdominal pains. She hasn't point with Dr. Donne Hazel next week to discuss bilateral mastectomy, and she will also require chemotherapy. She also has follow-up visit with Dr. Jana Hakim not scheduled. He denies any family history of gastric or pancreatic cancer. She's never had a prior upper endoscopy. Her father had colon cancer diagnosed in his 37s.  Prior colonoscopies: Colonoscopy 03/20/2017 - small cecal AVM, diverticulosis, no polyps Colonoscopy 09/10/2010 - normal Colonoscopy 07/30/2003 - normal   Past Medical History:  Diagnosis Date  . Anemia   . BRCA negative 2011   BRCA I/ II negative  . Breast cancer ([place]) 08/2009   stage 2, rx with lumpectomy and xrt  . COPD (chronic obstructive pulmonary disease) (HCC)    pt.unsure of diagnosis status  . Emphysema of lung ([place])    pt. questions diagnosis  . Family history of breast cancer   . Family history of colon cancer   . Family history of lung cancer   . GERD (gastroesophageal reflux disease)    not presently having symptoms  . Lung cancer ([place]) 06/06/05   stage 1 poorly differentiated adenocarcinoma, s/p right lower lobectomy.  . Osteoporosis   . Personal history of lung cancer   . STD (sexually transmitted disease)    HSV     Past Surgical History:  Procedure Laterality Date  . BREAST BIOPSY    . BREAST LUMPECTOMY  10/12/2009   Left  lumpectomy and radiation, stage II, ER/PR+, Her 2 nu negative  . BUNIONECTOMY    . COLONOSCOPY    . LOBECTOMY  06/06/2005   RLL  . TONSILLECTOMY    . TUBAL LIGATION  1984   Family History  Problem Relation Age of Onset  . Allergies Mother   . Asthma Mother   . Lung cancer Mother   . Breast cancer Mother 69       recurrence age 11  . Colon cancer Father 28  . Prostate cancer Brother 10  . Breast cancer Sister 39       Recurrence age 48 BRCA negative  . Colon polyps Sister   . Breast cancer Sister 36  . Prostate cancer Brother 61  . Breast cancer Maternal Grandmother 83  . Colon cancer Maternal Aunt   . Leukemia Maternal Grandfather   . Lung cancer Maternal Aunt   . Breast cancer Cousin   . Esophageal cancer Neg Hx   . Rectal cancer Neg Hx   . Stomach cancer Neg Hx    Social History   Tobacco Use  . Smoking status: Former Smoker    Packs/day: 2.00    Years: 18.00    Pack years: 36.00    Last attempt to quit: 07/12/1987    Years since quitting: 30.6  . Smokeless tobacco: Never Used  Substance Use Topics  . Alcohol use: Yes    Alcohol/week: 4.0 standard drinks  Types: 4 Standard drinks or equivalent per week    Comment: social  . Drug use: No   Current Outpatient Medications  Medication Sig Dispense Refill  . acyclovir (ZOVIRAX) 400 MG tablet TAKE 1 TABLET BY MOUTH TWICE A DAY 90 tablet 0  . Calcium Carbonate (CALCIUM 600 PO) Take 600 mg by mouth daily.      . cholecalciferol (VITAMIN D) 1000 UNITS tablet Take 1,000 Units by mouth daily.    . Psyllium (METAMUCIL PO) Take by mouth daily.      . tamoxifen (NOLVADEX) 20 MG tablet Take 1 tablet (20 mg total) by mouth daily. 90 tablet 12  . vitamin C (ASCORBIC ACID) 500 MG tablet Take 500 mg by mouth daily.     No current facility-administered medications for this visit.    Allergies  Allergen Reactions  . Clarithromycin     REACTION: rash     Review of Systems: All systems reviewed and negative except where  noted in HPI.    Lab Results  Component Value Date   WBC 3.7 (L) 11/07/2017   HGB 12.6 11/07/2017   HCT 37.1 11/07/2017   MCV 93.2 11/07/2017   PLT 304 11/07/2017    Lab Results  Component Value Date   CREATININE 0.64 11/07/2017   BUN 18 11/07/2017   NA 141 11/07/2017   K 4.8 11/07/2017   CL 103 11/07/2017   CO2 30 11/07/2017    Lab Results  Component Value Date   ALT 8 11/07/2017   AST 18 11/07/2017   ALKPHOS 81 12/22/2016   BILITOT 0.5 11/07/2017     Physical Exam: BP 118/80   Pulse 72   Ht _0  (1.676 m)   Wt 119 lb 2 oz (54 kg)   BMI 19.23 kg/m  Constitutional: Pleasant,well-developed, female in no acute distress. HEENT: Normocephalic and atraumatic. Conjunctivae are normal. No scleral icterus. Neck supple.  Cardiovascular: Normal rate, regular rhythm.  Pulmonary/chest: Effort normal and breath sounds normal. No wheezing, rales or rhonchi. Abdominal: Soft, nondistended, nontender.  There are no masses palpable. No hepatomegaly. Extremities: no edema Lymphadenopathy: No cervical adenopathy noted. Neurological: Alert and oriented to person place and time. Skin: Skin is warm and dry. No rashes noted. Psychiatric: Normal mood and affect. Behavior is normal.   ASSESSMENT AND PLAN: 67 year old female with a history of bilateral breast cancer as well as lung cancer, here for reassessment of the following issues:  TP53 mutation - she has a history of malignancies as outlined above, now with this gene mutation which can be associated with Li-Fraumeni syndrome. We discussed with this entailed. Guidelines recommend colonoscopy every 2-5 years for screening purposes given increased risk for colon cancer. She had a colonoscopy last year so I don't think we need to perform one for at least another year. Further she's had 3 colonoscopies since 2000, none of which have shown any polyps, she may not need colonoscopy every 2 years but can discuss with her over time how  frequent she wishes to have this done. Guidelines also recommend MRI of the brain annually as well as whole-body MRI to screen for other malignancies. She has not had a cross-sectional imaging of her abdomen, but has had imaging of her chest routinely. I think MRI of the abdomen and pelvis is reasonable however defer to her visit with Dr. Jana Hakim regarding evaluation for other malignancies. She agreed. I will see her back in the office in one year for reassessment, in the interim she will  be going through surgery and chemo for her breast cancer. All questions answered.   [place] Cellar, MD [facility]

## 2018-03-02 NOTE — Patient Instructions (Addendum)
If you are age 66 or older, your body mass index should be between 23-30. Your Body mass index is 19.23 kg/m. If this is out of the aforementioned range listed, please consider follow up with your Primary Care Provider.  If you are age 26 or younger, your body mass index should be between 19-25. Your Body mass index is 19.23 kg/m. If this is out of the aformentioned range listed, please consider follow up with your Primary Care Provider.   Please follow up with me in the office in 1 year.  Thank you for entrusting me with your care and for choosing [facility], Dr. Sequoyah Cellar

## 2018-03-05 ENCOUNTER — Telehealth: Payer: Self-pay | Admitting: Internal Medicine

## 2018-03-05 NOTE — Telephone Encounter (Signed)
Called patient to advise of the findings of review by Jeanell Sparrow on DOS 11/14/17.  Initial Preventative Exam was denied because it took place after patients first 12 months on Medicare Part B ($345).  Because Medicare didn't pay, neither would [employer].  So, an addendum was done to reflect it was not an initial preventative exam, and rebilled with a "G" code.   Lipid panel and thyroid panel are to be resubmitted by [facility] as well with hyperlipidemia and screening for thyroid.  Advised patient that lipid will probably be paid, and thyroid probably will not be paid.  Patient seemed grateful for the work that had been done on these issues.  She knows to wait for the new EOB's that will come to her.

## 2018-03-07 ENCOUNTER — Other Ambulatory Visit: Payer: Self-pay | Admitting: General Surgery

## 2018-03-07 DIAGNOSIS — C50411 Malignant neoplasm of upper-outer quadrant of right female breast: Secondary | ICD-10-CM | POA: Diagnosis not present

## 2018-03-07 DIAGNOSIS — Z1501 Genetic susceptibility to malignant neoplasm of breast: Secondary | ICD-10-CM | POA: Diagnosis not present

## 2018-03-07 DIAGNOSIS — Z170 Estrogen receptor positive status [ER+]: Principal | ICD-10-CM

## 2018-03-09 ENCOUNTER — Telehealth: Payer: Self-pay | Admitting: Oncology

## 2018-03-09 ENCOUNTER — Encounter: Payer: Self-pay | Admitting: *Deleted

## 2018-03-09 NOTE — Telephone Encounter (Signed)
lvm for patient to call back and r/s 9/6 appt per 8/30 sch message. -

## 2018-03-13 ENCOUNTER — Encounter (HOSPITAL_COMMUNITY): Payer: Self-pay

## 2018-03-14 ENCOUNTER — Encounter (HOSPITAL_COMMUNITY): Payer: Self-pay | Admitting: Anesthesiology

## 2018-03-14 NOTE — Anesthesia Preprocedure Evaluation (Addendum)
Anesthesia Evaluation  Patient identified by MRN, date of birth, ID band Patient awake    Reviewed: Allergy & Precautions, NPO status , Patient's Chart, lab work & pertinent test results  Airway Mallampati: II  TM Distance: >3 FB Neck ROM: Full    Dental  (+) Teeth Intact   Pulmonary COPD, former smoker,  Hx/o Lung Ca S/P RLLobectomy   breath sounds clear to auscultation + decreased breath sounds      Cardiovascular negative cardio ROS Normal cardiovascular exam Rhythm:Regular Rate:Normal     Neuro/Psych Anxiety negative neurological ROS     GI/Hepatic Neg liver ROS, GERD  Medicated,  Endo/Other  Right Breast Ca  Renal/GU negative Renal ROS     Musculoskeletal Osteoporosis   Abdominal   Peds  Hematology  (+) anemia ,   Anesthesia Other Findings   Reproductive/Obstetrics                            Anesthesia Physical Anesthesia Plan  ASA: III  Anesthesia Plan: General   Post-op Pain Management:  Regional for Post-op pain   Induction: Intravenous  PONV Risk Score and Plan:   Airway Management Planned: LMA  Additional Equipment:   Intra-op Plan:   Post-operative Plan: Extubation in OR  Informed Consent: I have reviewed the patients History and Physical, chart, labs and discussed the procedure including the risks, benefits and alternatives for the proposed anesthesia with the patient or authorized representative who has indicated his/her understanding and acceptance.   Dental advisory given  Plan Discussed with: CRNA and Surgeon  Anesthesia Plan Comments:        Anesthesia Quick Evaluation

## 2018-03-15 ENCOUNTER — Encounter (HOSPITAL_COMMUNITY)
Admission: RE | Admit: 2018-03-15 | Discharge: 2018-03-15 | Disposition: A | Discharge status: Home / Self Care | Payer: Medicare Other | Source: Ambulatory Visit | Attending: General Surgery | Admitting: General Surgery

## 2018-03-15 ENCOUNTER — Other Ambulatory Visit: Payer: Self-pay

## 2018-03-15 ENCOUNTER — Observation Stay (HOSPITAL_COMMUNITY): Payer: Medicare Other

## 2018-03-15 ENCOUNTER — Observation Stay (HOSPITAL_COMMUNITY)
Admission: RE | Admit: 2018-03-15 | Discharge: 2018-03-16 | Disposition: A | Discharge status: Home / Self Care | Payer: Medicare Other | Source: Ambulatory Visit | Attending: General Surgery | Admitting: General Surgery

## 2018-03-15 ENCOUNTER — Encounter (HOSPITAL_COMMUNITY)
Admission: RE | Disposition: A | Discharge status: Home / Self Care | Payer: Self-pay | Source: Ambulatory Visit | Attending: General Surgery

## 2018-03-15 ENCOUNTER — Ambulatory Visit (HOSPITAL_COMMUNITY): Payer: Medicare Other | Admitting: Certified Registered Nurse Anesthetist

## 2018-03-15 ENCOUNTER — Encounter (HOSPITAL_COMMUNITY): Payer: Self-pay | Admitting: *Deleted

## 2018-03-15 DIAGNOSIS — C50911 Malignant neoplasm of unspecified site of right female breast: Secondary | ICD-10-CM | POA: Diagnosis present

## 2018-03-15 DIAGNOSIS — Z419 Encounter for procedure for purposes other than remedying health state, unspecified: Secondary | ICD-10-CM

## 2018-03-15 DIAGNOSIS — Z170 Estrogen receptor positive status [ER+]: Secondary | ICD-10-CM | POA: Insufficient documentation

## 2018-03-15 DIAGNOSIS — Z1501 Genetic susceptibility to malignant neoplasm of breast: Secondary | ICD-10-CM | POA: Diagnosis not present

## 2018-03-15 DIAGNOSIS — Z4682 Encounter for fitting and adjustment of non-vascular catheter: Secondary | ICD-10-CM | POA: Diagnosis not present

## 2018-03-15 DIAGNOSIS — F419 Anxiety disorder, unspecified: Secondary | ICD-10-CM | POA: Diagnosis not present

## 2018-03-15 DIAGNOSIS — Z800 Family history of malignant neoplasm of digestive organs: Secondary | ICD-10-CM | POA: Insufficient documentation

## 2018-03-15 DIAGNOSIS — Z801 Family history of malignant neoplasm of trachea, bronchus and lung: Secondary | ICD-10-CM | POA: Insufficient documentation

## 2018-03-15 DIAGNOSIS — Z881 Allergy status to other antibiotic agents status: Secondary | ICD-10-CM | POA: Diagnosis not present

## 2018-03-15 DIAGNOSIS — Z85118 Personal history of other malignant neoplasm of bronchus and lung: Secondary | ICD-10-CM | POA: Diagnosis not present

## 2018-03-15 DIAGNOSIS — C50411 Malignant neoplasm of upper-outer quadrant of right female breast: Secondary | ICD-10-CM

## 2018-03-15 DIAGNOSIS — Z853 Personal history of malignant neoplasm of breast: Secondary | ICD-10-CM | POA: Diagnosis not present

## 2018-03-15 DIAGNOSIS — C50412 Malignant neoplasm of upper-outer quadrant of left female breast: Principal | ICD-10-CM | POA: Insufficient documentation

## 2018-03-15 DIAGNOSIS — Z923 Personal history of irradiation: Secondary | ICD-10-CM | POA: Insufficient documentation

## 2018-03-15 DIAGNOSIS — N6081 Other benign mammary dysplasias of right breast: Secondary | ICD-10-CM | POA: Diagnosis not present

## 2018-03-15 DIAGNOSIS — N6032 Fibrosclerosis of left breast: Secondary | ICD-10-CM | POA: Diagnosis not present

## 2018-03-15 DIAGNOSIS — Z95828 Presence of other vascular implants and grafts: Secondary | ICD-10-CM

## 2018-03-15 DIAGNOSIS — Z79899 Other long term (current) drug therapy: Secondary | ICD-10-CM | POA: Insufficient documentation

## 2018-03-15 DIAGNOSIS — M810 Age-related osteoporosis without current pathological fracture: Secondary | ICD-10-CM | POA: Diagnosis not present

## 2018-03-15 DIAGNOSIS — Z87891 Personal history of nicotine dependence: Secondary | ICD-10-CM | POA: Diagnosis not present

## 2018-03-15 DIAGNOSIS — Z803 Family history of malignant neoplasm of breast: Secondary | ICD-10-CM | POA: Insufficient documentation

## 2018-03-15 DIAGNOSIS — Z4001 Encounter for prophylactic removal of breast: Secondary | ICD-10-CM | POA: Insufficient documentation

## 2018-03-15 DIAGNOSIS — Z902 Acquired absence of lung [part of]: Secondary | ICD-10-CM | POA: Insufficient documentation

## 2018-03-15 DIAGNOSIS — G8918 Other acute postprocedural pain: Secondary | ICD-10-CM | POA: Diagnosis not present

## 2018-03-15 DIAGNOSIS — J439 Emphysema, unspecified: Secondary | ICD-10-CM | POA: Diagnosis not present

## 2018-03-15 DIAGNOSIS — N6011 Diffuse cystic mastopathy of right breast: Secondary | ICD-10-CM | POA: Insufficient documentation

## 2018-03-15 HISTORY — PX: PORTACATH PLACEMENT: SHX2246

## 2018-03-15 HISTORY — DX: Family history of other specified conditions: Z84.89

## 2018-03-15 HISTORY — PX: MASTECTOMY W/ SENTINEL NODE BIOPSY: SHX2001

## 2018-03-15 LAB — BASIC METABOLIC PANEL
Anion gap: 10 (ref 5–15)
BUN: 16 mg/dL (ref 8–23)
CALCIUM: 8.9 mg/dL (ref 8.9–10.3)
CO2: 25 mmol/L (ref 22–32)
CREATININE: 0.6 mg/dL (ref 0.44–1.00)
Chloride: 104 mmol/L (ref 98–111)
GFR calc non Af Amer: >60 mL/min (ref >60)
Glucose, Bld: 76 mg/dL (ref 70–99)
Potassium: 3.8 mmol/L (ref 3.5–5.1)
SODIUM: 139 mmol/L (ref 135–145)

## 2018-03-15 LAB — CBC
HCT: 37.2 % (ref 36.0–46.0)
Hemoglobin: 11.8 g/dL — ABNORMAL LOW (ref 12.0–15.0)
MCH: 30.6 pg (ref 26.0–34.0)
MCHC: 31.7 g/dL (ref 30.0–36.0)
MCV: 96.4 fL (ref 78.0–100.0)
PLATELETS: 241 10*3/uL (ref 150–400)
RBC: 3.86 MIL/uL — ABNORMAL LOW (ref 3.87–5.11)
RDW: 12.4 % (ref 11.5–15.5)
WBC: 4 10*3/uL (ref 4.0–10.5)

## 2018-03-15 SURGERY — MASTECTOMY WITH SENTINEL LYMPH NODE BIOPSY
Anesthesia: General | Site: Chest

## 2018-03-15 MED ORDER — PROPOFOL 10 MG/ML IV BOLUS
INTRAVENOUS | Status: DC | PRN
  Administered 2018-03-15: 150 mg via INTRAVENOUS

## 2018-03-15 MED ORDER — OXYCODONE HCL 5 MG PO TABS
5.0000 mg | ORAL_TABLET | ORAL | Status: DC | PRN
  Administered 2018-03-15 – 2018-03-16 (×3): 5 mg via ORAL
  Filled 2018-03-15 (×3): qty 1

## 2018-03-15 MED ORDER — MIDAZOLAM HCL 2 MG/2ML IJ SOLN
INTRAMUSCULAR | Status: DC | PRN
  Administered 2018-03-15 (×2): 1 mg via INTRAVENOUS

## 2018-03-15 MED ORDER — DEXAMETHASONE SODIUM PHOSPHATE 10 MG/ML IJ SOLN
INTRAMUSCULAR | Status: AC
  Filled 2018-03-15: qty 1

## 2018-03-15 MED ORDER — METHYLENE BLUE 0.5 % INJ SOLN
INTRAVENOUS | Status: AC
  Filled 2018-03-15: qty 10

## 2018-03-15 MED ORDER — SODIUM CHLORIDE 0.9 % IV SOLN
INTRAVENOUS | Status: DC | PRN
  Administered 2018-03-15: 08:00:00

## 2018-03-15 MED ORDER — PHENYLEPHRINE HCL 10 MG/ML IJ SOLN
INTRAMUSCULAR | Status: AC
  Filled 2018-03-15: qty 1

## 2018-03-15 MED ORDER — POLYETHYLENE GLYCOL 3350 17 G PO PACK: 17.0000 g | PACK | Freq: Every day | ORAL | Status: DC | PRN

## 2018-03-15 MED ORDER — MORPHINE SULFATE (PF) 2 MG/ML IV SOLN
1.0000 mg | INTRAVENOUS | Status: DC | PRN
  Administered 2018-03-15 – 2018-03-16 (×3): 1 mg via INTRAVENOUS
  Filled 2018-03-15 (×3): qty 1

## 2018-03-15 MED ORDER — PHENYLEPHRINE HCL 10 MG/ML IJ SOLN
INTRAMUSCULAR | Status: DC | PRN
  Administered 2018-03-15: 40 ug via INTRAVENOUS
  Administered 2018-03-15: 80 ug via INTRAVENOUS

## 2018-03-15 MED ORDER — MEPERIDINE HCL 50 MG/ML IJ SOLN: 6.2500 mg | INTRAMUSCULAR | Status: DC | PRN

## 2018-03-15 MED ORDER — ALUM & MAG HYDROXIDE-SIMETH 200-200-20 MG/5ML PO SUSP
30.0000 mL | Freq: Four times a day (QID) | ORAL | Status: DC | PRN
  Administered 2018-03-15: 30 mL via ORAL
  Filled 2018-03-15: qty 30

## 2018-03-15 MED ORDER — ACETAMINOPHEN 500 MG PO TABS
1000.0000 mg | ORAL_TABLET | Freq: Four times a day (QID) | ORAL | Status: DC
  Administered 2018-03-15 – 2018-03-16 (×3): 1000 mg via ORAL
  Filled 2018-03-15 (×3): qty 2

## 2018-03-15 MED ORDER — ONDANSETRON HCL 4 MG/2ML IJ SOLN: 4.0000 mg | Freq: Four times a day (QID) | INTRAMUSCULAR | Status: DC | PRN

## 2018-03-15 MED ORDER — FENTANYL CITRATE (PF) 250 MCG/5ML IJ SOLN
INTRAMUSCULAR | Status: AC
  Filled 2018-03-15: qty 5

## 2018-03-15 MED ORDER — HYDROMORPHONE HCL 1 MG/ML IJ SOLN
0.2500 mg | INTRAMUSCULAR | Status: DC | PRN
  Administered 2018-03-15 (×2): 0.5 mg via INTRAVENOUS

## 2018-03-15 MED ORDER — ONDANSETRON 4 MG PO TBDP: 4.0000 mg | ORAL_TABLET | Freq: Four times a day (QID) | ORAL | Status: DC | PRN

## 2018-03-15 MED ORDER — LIDOCAINE 2% (20 MG/ML) 5 ML SYRINGE
INTRAMUSCULAR | Status: AC
  Filled 2018-03-15: qty 5

## 2018-03-15 MED ORDER — HYDROMORPHONE HCL 1 MG/ML IJ SOLN
INTRAMUSCULAR | Status: AC
  Filled 2018-03-15: qty 1

## 2018-03-15 MED ORDER — SODIUM CHLORIDE 0.9 % IV SOLN
INTRAVENOUS | Status: DC
  Administered 2018-03-15: 13:00:00 via INTRAVENOUS

## 2018-03-15 MED ORDER — SODIUM CHLORIDE 0.9 % IV SOLN
INTRAVENOUS | Status: DC | PRN
  Administered 2018-03-15: 25 ug/min via INTRAVENOUS

## 2018-03-15 MED ORDER — HEPARIN SOD (PORK) LOCK FLUSH 100 UNIT/ML IV SOLN
INTRAVENOUS | Status: AC
  Filled 2018-03-15: qty 5

## 2018-03-15 MED ORDER — TECHNETIUM TC 99M SULFUR COLLOID FILTERED
1.0000 | Freq: Once | INTRAVENOUS | Status: AC | PRN
  Administered 2018-03-15: 1 via INTRADERMAL

## 2018-03-15 MED ORDER — ONDANSETRON HCL 4 MG/2ML IJ SOLN
INTRAMUSCULAR | Status: DC | PRN
  Administered 2018-03-15: 4 mg via INTRAVENOUS

## 2018-03-15 MED ORDER — METHOCARBAMOL 500 MG PO TABS
500.0000 mg | ORAL_TABLET | Freq: Four times a day (QID) | ORAL | Status: DC | PRN
  Administered 2018-03-15: 500 mg via ORAL
  Filled 2018-03-15: qty 1

## 2018-03-15 MED ORDER — MIDAZOLAM HCL 2 MG/2ML IJ SOLN
INTRAMUSCULAR | Status: AC
  Filled 2018-03-15: qty 2

## 2018-03-15 MED ORDER — ONDANSETRON HCL 4 MG/2ML IJ SOLN
INTRAMUSCULAR | Status: AC
  Filled 2018-03-15: qty 2

## 2018-03-15 MED ORDER — ENSURE PRE-SURGERY PO LIQD: 296.0000 mL | Freq: Once | ORAL | Status: DC

## 2018-03-15 MED ORDER — METHOCARBAMOL 500 MG PO TABS
ORAL_TABLET | ORAL | Status: AC
  Filled 2018-03-15: qty 1

## 2018-03-15 MED ORDER — METOCLOPRAMIDE HCL 5 MG/ML IJ SOLN: 10.0000 mg | Freq: Once | INTRAMUSCULAR | Status: DC | PRN

## 2018-03-15 MED ORDER — BUPIVACAINE-EPINEPHRINE (PF) 0.25% -1:200000 IJ SOLN
INTRAMUSCULAR | Status: DC | PRN
  Administered 2018-03-15 (×2): 25 mL via PERINEURAL

## 2018-03-15 MED ORDER — GABAPENTIN 100 MG PO CAPS
100.0000 mg | ORAL_CAPSULE | ORAL | Status: AC
  Administered 2018-03-15: 100 mg via ORAL
  Filled 2018-03-15: qty 1

## 2018-03-15 MED ORDER — SODIUM CHLORIDE 0.9 % IJ SOLN
INTRAMUSCULAR | Status: AC
  Filled 2018-03-15: qty 10

## 2018-03-15 MED ORDER — SIMETHICONE 80 MG PO CHEW: 40.0000 mg | CHEWABLE_TABLET | Freq: Four times a day (QID) | ORAL | Status: DC | PRN

## 2018-03-15 MED ORDER — SODIUM CHLORIDE 0.9 % IV SOLN
INTRAVENOUS | Status: AC
  Filled 2018-03-15: qty 1.2

## 2018-03-15 MED ORDER — PROPOFOL 10 MG/ML IV BOLUS
INTRAVENOUS | Status: AC
  Filled 2018-03-15: qty 20

## 2018-03-15 MED ORDER — BUPIVACAINE HCL (PF) 0.25 % IJ SOLN
INTRAMUSCULAR | Status: AC
  Filled 2018-03-15: qty 30

## 2018-03-15 MED ORDER — HEPARIN SOD (PORK) LOCK FLUSH 100 UNIT/ML IV SOLN
INTRAVENOUS | Status: DC | PRN
  Administered 2018-03-15: 4.2 [IU] via INTRAVENOUS

## 2018-03-15 MED ORDER — DEXAMETHASONE SODIUM PHOSPHATE 10 MG/ML IJ SOLN
INTRAMUSCULAR | Status: DC | PRN
  Administered 2018-03-15: 10 mg via INTRAVENOUS

## 2018-03-15 MED ORDER — ACETAMINOPHEN 500 MG PO TABS
1000.0000 mg | ORAL_TABLET | ORAL | Status: AC
  Administered 2018-03-15: 1000 mg via ORAL
  Filled 2018-03-15: qty 2

## 2018-03-15 MED ORDER — LACTATED RINGERS IV SOLN
INTRAVENOUS | Status: DC | PRN
  Administered 2018-03-15 (×2): via INTRAVENOUS

## 2018-03-15 MED ORDER — CEFAZOLIN SODIUM-DEXTROSE 2-4 GM/100ML-% IV SOLN
2.0000 g | INTRAVENOUS | Status: AC
  Administered 2018-03-15: 2 g via INTRAVENOUS
  Filled 2018-03-15: qty 100

## 2018-03-15 MED ORDER — BUPIVACAINE HCL 0.25 % IJ SOLN
INTRAMUSCULAR | Status: DC | PRN
  Administered 2018-03-15: 5 mL

## 2018-03-15 MED ORDER — 0.9 % SODIUM CHLORIDE (POUR BTL) OPTIME
TOPICAL | Status: DC | PRN
  Administered 2018-03-15: 1000 mL

## 2018-03-15 MED ORDER — KETOROLAC TROMETHAMINE 15 MG/ML IJ SOLN
15.0000 mg | INTRAMUSCULAR | Status: AC
  Administered 2018-03-15: 15 mg via INTRAVENOUS
  Filled 2018-03-15: qty 1

## 2018-03-15 MED ORDER — LIDOCAINE 2% (20 MG/ML) 5 ML SYRINGE
INTRAMUSCULAR | Status: DC | PRN
  Administered 2018-03-15: 40 mg via INTRAVENOUS

## 2018-03-15 MED ORDER — FENTANYL CITRATE (PF) 250 MCG/5ML IJ SOLN
INTRAMUSCULAR | Status: DC | PRN
  Administered 2018-03-15 (×2): 50 ug via INTRAVENOUS

## 2018-03-15 SURGICAL SUPPLY — 83 items
APPLIER CLIP 9.375 MED OPEN (MISCELLANEOUS) ×4
BAG DECANTER FOR FLEXI CONT (MISCELLANEOUS) ×4 IMPLANT
BINDER BREAST LRG (GAUZE/BANDAGES/DRESSINGS) IMPLANT
BINDER BREAST MEDIUM (GAUZE/BANDAGES/DRESSINGS) ×4 IMPLANT
BINDER BREAST XLRG (GAUZE/BANDAGES/DRESSINGS) IMPLANT
BIOPATCH RED 1 DISK 7.0 (GAUZE/BANDAGES/DRESSINGS) ×3 IMPLANT
BIOPATCH RED 1IN DISK 7.0MM (GAUZE/BANDAGES/DRESSINGS) ×1
CANISTER SUCT 3000ML PPV (MISCELLANEOUS) ×4 IMPLANT
CHLORAPREP W/TINT 10.5 ML (MISCELLANEOUS) ×4 IMPLANT
CHLORAPREP W/TINT 26ML (MISCELLANEOUS) ×4 IMPLANT
CLIP APPLIE 9.375 MED OPEN (MISCELLANEOUS) ×2 IMPLANT
CLOSURE WOUND 1/2 X4 (GAUZE/BANDAGES/DRESSINGS) ×1
CONT SPEC 4OZ CLIKSEAL STRL BL (MISCELLANEOUS) ×4 IMPLANT
COVER PROBE W GEL 5X96 (DRAPES) ×4 IMPLANT
COVER SURGICAL LIGHT HANDLE (MISCELLANEOUS) ×8 IMPLANT
COVER TRANSDUCER ULTRASND GEL (DRAPE) ×4 IMPLANT
CRADLE DONUT ADULT HEAD (MISCELLANEOUS) ×4 IMPLANT
DECANTER SPIKE VIAL GLASS SM (MISCELLANEOUS) ×4 IMPLANT
DERMABOND ADVANCED (GAUZE/BANDAGES/DRESSINGS) ×2
DERMABOND ADVANCED .7 DNX12 (GAUZE/BANDAGES/DRESSINGS) ×2 IMPLANT
DRAIN CHANNEL 19F RND (DRAIN) ×4 IMPLANT
DRAPE C-ARM 42X72 X-RAY (DRAPES) ×4 IMPLANT
DRAPE CHEST BREAST 15X10 FENES (DRAPES) ×8 IMPLANT
DRAPE LAPAROSCOPIC ABDOMINAL (DRAPES) ×4 IMPLANT
DRSG TEGADERM 2-3/8X2-3/4 SM (GAUZE/BANDAGES/DRESSINGS) ×4 IMPLANT
ELECT BLADE 4.0 EZ CLEAN MEGAD (MISCELLANEOUS) ×4
ELECT CAUTERY BLADE 6.4 (BLADE) ×4 IMPLANT
ELECT REM PT RETURN 9FT ADLT (ELECTROSURGICAL) ×4
ELECTRODE BLDE 4.0 EZ CLN MEGD (MISCELLANEOUS) ×2 IMPLANT
ELECTRODE REM PT RTRN 9FT ADLT (ELECTROSURGICAL) ×2 IMPLANT
EVACUATOR SILICONE 100CC (DRAIN) ×4 IMPLANT
GAUZE 4X4 16PLY RFD (DISPOSABLE) ×4 IMPLANT
GAUZE SPONGE 4X4 12PLY STRL (GAUZE/BANDAGES/DRESSINGS) ×4 IMPLANT
GEL ULTRASOUND 20GR AQUASONIC (MISCELLANEOUS) ×4 IMPLANT
GLOVE BIO SURGEON STRL SZ7 (GLOVE) ×4 IMPLANT
GLOVE BIOGEL PI IND STRL 7.5 (GLOVE) ×2 IMPLANT
GLOVE BIOGEL PI INDICATOR 7.5 (GLOVE) ×2
GOWN STRL REUS W/ TWL LRG LVL3 (GOWN DISPOSABLE) ×6 IMPLANT
GOWN STRL REUS W/TWL LRG LVL3 (GOWN DISPOSABLE) ×6
INTRODUCER COOK 11FR (CATHETERS) IMPLANT
KIT BASIN OR (CUSTOM PROCEDURE TRAY) ×4 IMPLANT
KIT PORT POWER 8FR ISP CVUE (Port) ×4 IMPLANT
KIT TURNOVER KIT B (KITS) ×4 IMPLANT
MARKER SKIN DUAL TIP RULER LAB (MISCELLANEOUS) ×4 IMPLANT
NEEDLE 18GX1X1/2 (RX/OR ONLY) (NEEDLE) IMPLANT
NEEDLE FILTER BLUNT 18X 1/2SAF (NEEDLE)
NEEDLE FILTER BLUNT 18X1 1/2 (NEEDLE) IMPLANT
NEEDLE HYPO 25GX1X1/2 BEV (NEEDLE) IMPLANT
NS IRRIG 1000ML POUR BTL (IV SOLUTION) ×4 IMPLANT
PACK GENERAL/GYN (CUSTOM PROCEDURE TRAY) ×4 IMPLANT
PAD ABD 8X10 STRL (GAUZE/BANDAGES/DRESSINGS) ×4 IMPLANT
PAD ARMBOARD 7.5X6 YLW CONV (MISCELLANEOUS) ×8 IMPLANT
PENCIL BUTTON HOLSTER BLD 10FT (ELECTRODE) IMPLANT
PENCIL SMOKE EVACUATOR (MISCELLANEOUS) ×4 IMPLANT
PIN SAFETY STERILE (MISCELLANEOUS) ×4 IMPLANT
PUNCH BIOPSY DERMAL 6MM STRL (MISCELLANEOUS) ×4 IMPLANT
SET INTRODUCER 12FR PACEMAKER (INTRODUCER) IMPLANT
SET SHEATH INTRODUCER 10FR (MISCELLANEOUS) IMPLANT
SHEATH COOK PEEL AWAY SET 9F (SHEATH) IMPLANT
SPECIMEN JAR X LARGE (MISCELLANEOUS) ×4 IMPLANT
STAPLER VISISTAT 35W (STAPLE) ×4 IMPLANT
STRIP CLOSURE SKIN 1/2X4 (GAUZE/BANDAGES/DRESSINGS) ×3 IMPLANT
SUT ETHILON 2 0 FS 18 (SUTURE) ×4 IMPLANT
SUT ETHILON 3 0 FSL (SUTURE) ×4 IMPLANT
SUT ETHILON 3 0 PS 1 (SUTURE) ×8 IMPLANT
SUT MNCRL AB 4-0 PS2 18 (SUTURE) ×4 IMPLANT
SUT MON AB 4-0 PC3 18 (SUTURE) IMPLANT
SUT PROLENE 2 0 SH DA (SUTURE) ×8 IMPLANT
SUT SILK 2 0 (SUTURE)
SUT SILK 2 0 SH (SUTURE) ×4 IMPLANT
SUT SILK 2-0 18XBRD TIE 12 (SUTURE) IMPLANT
SUT VIC AB 3-0 54X BRD REEL (SUTURE) ×2 IMPLANT
SUT VIC AB 3-0 BRD 54 (SUTURE) ×3
SUT VIC AB 3-0 SH 18 (SUTURE) ×4 IMPLANT
SUT VIC AB 3-0 SH 27 (SUTURE) ×2
SUT VIC AB 3-0 SH 27XBRD (SUTURE) ×2 IMPLANT
SUT VIC AB 3-0 SH 8-18 (SUTURE) ×12 IMPLANT
SYR 5ML LUER SLIP (SYRINGE) ×4 IMPLANT
SYR CONTROL 10ML LL (SYRINGE) IMPLANT
TOWEL OR 17X24 6PK STRL BLUE (TOWEL DISPOSABLE) ×4 IMPLANT
TOWEL OR 17X26 10 PK STRL BLUE (TOWEL DISPOSABLE) ×4 IMPLANT
TUBE CONNECTING 20'X1/4 (TUBING) ×1
TUBE CONNECTING 20X1/4 (TUBING) ×3 IMPLANT

## 2018-03-15 NOTE — Anesthesia Postprocedure Evaluation (Signed)
Anesthesia Post Note  Patient: Anna Valentine  Procedure(s) Performed: BILATERAL TOTAL MASTECTOMIES WITH RIGHT SENTINEL LYMPH NODE BIOPSY (Bilateral Breast) INSERTION PORT-A-CATH WITH [place] (N/A Chest)     Patient location during evaluation: PACU Anesthesia Type: General Level of consciousness: awake and alert and oriented Pain management: pain level controlled Vital Signs Assessment: post-procedure vital signs reviewed and stable Respiratory status: spontaneous breathing, nonlabored ventilation, respiratory function stable and patient connected to nasal cannula oxygen Cardiovascular status: blood pressure returned to baseline and stable Postop Assessment: no apparent nausea or vomiting Anesthetic complications: no    Last Vitals:  Vitals:   03/15/18 1100 03/15/18 1115  BP: (!) 146/83 (!) 152/80  Pulse: 65 68  Resp: 15 11  Temp:    SpO2: 97% 95%    Last Pain:  Vitals:   03/15/18 1115  TempSrc:   PainSc: Asleep                 Imri Lor A.

## 2018-03-15 NOTE — Transfer of Care (Signed)
Immediate Anesthesia Transfer of Care Note  Patient: Anna Valentine  Procedure(s) Performed: BILATERAL TOTAL MASTECTOMIES WITH RIGHT SENTINEL LYMPH NODE BIOPSY (Bilateral Breast) INSERTION PORT-A-CATH WITH [place] (N/A Chest)  Patient Location: PACU  Anesthesia Type:General  Level of Consciousness: drowsy  Airway & Oxygen Therapy: Patient Spontanous Breathing and Patient connected to nasal cannula oxygen  Post-op Assessment: Report given to RN and Post -op Vital signs reviewed and stable  Post vital signs: Reviewed and stable  Last Vitals:  Vitals Value Taken Time  BP 164/88 03/15/2018 10:16 AM  Temp    Pulse 71 03/15/2018 10:18 AM  Resp 10 03/15/2018 10:18 AM  SpO2 100 % 03/15/2018 10:18 AM  Vitals shown include unvalidated device data.  Last Pain:  Vitals:   03/15/18 0640  TempSrc:   PainSc: 0-No pain      Patients Stated Pain Goal: 5 (73/71/06 2694)  Complications: No apparent anesthesia complications

## 2018-03-15 NOTE — Interval H&P Note (Signed)
History and Physical Interval Note:  03/15/2018 7:19 AM  Anna Valentine  has presented today for surgery, with the diagnosis of RIGHT BREAST CANCER, P53 MUTATION  The various methods of treatment have been discussed with the patient and family. After consideration of risks, benefits and other options for treatment, the patient has consented to  Procedure(s): BILATERAL TOTAL MASTECTOMIES WITH RIGHT SENTINEL LYMPH NODE BIOPSY (Bilateral) INSERTION PORT-A-CATH WITH [place] (N/A) as a surgical intervention .  The patient's history has been reviewed, patient examined, no change in status, stable for surgery.  I have reviewed the patient's chart and labs.  Questions were answered to the patient's satisfaction.     Rolm Bookbinder

## 2018-03-15 NOTE — Anesthesia Procedure Notes (Addendum)
Procedure Name: LMA Insertion Date/Time: 03/15/2018 7:40 AM Performed by: Bryson Corona, CRNA Pre-anesthesia Checklist: Patient identified, Emergency Drugs available, Suction available and Patient being monitored Patient Re-evaluated:Patient Re-evaluated prior to induction Oxygen Delivery Method: Circle System Utilized Preoxygenation: Pre-oxygenation with 100% oxygen Induction Type: IV induction Ventilation: Mask ventilation without difficulty LMA: LMA inserted LMA Size: 4.0 Number of attempts: 1 Placement Confirmation: positive ETCO2 Tube secured with: Tape Dental Injury: Teeth and Oropharynx as per pre-operative assessment

## 2018-03-15 NOTE — H&P (Signed)
Anna Valentine is an 67 y.o. female.   Chief Complaint: breast cancer HPI: 13 yof referred by Dr Uvaldo Bristle for new right breast cancer. She has history in 2006 of right upper lobectomy for lung ca with no other treatment for which she is doing well. she in 2011 had [place] that was er/pr pos treated with lump/sn, oncotype was 16 and she had radiotherapy followed by five years of arimidex which she tolerated well. she has done well until recently she noted a right uoq breast mass. mm shows c density breasts. US showed a 1.3x1.2x1 cm mass in right uoq and a possible mildly prominent axillary node. biopsy of node is negative. biopsy of breast is her 2 pos, er pos, pr negative IDC at least grade II. no nipple dc. brca 1/2 negative in 2011. she has very significant fh in first degree relatives. only one sister had testing and that was prior to 2011.she is awaiting results of panel testing. she has seen rad onc and can receive radiotherapy. since last visit we cancelled surgery and waited for genetics. she has a p53 mutation on her genetics. she has now seen plastic surgery and does not desire reconstruction. she is here to discuss surgery   Past Medical History:  Diagnosis Date  . Anemia   . BRCA negative 2011   BRCA I/ II negative  . Breast cancer ([place]) 08/2009   stage 2, rx with lumpectomy and xrt  . COPD (chronic obstructive pulmonary disease) (HCC)    pt.unsure of diagnosis status  . Emphysema of lung ([place])    pt. questions diagnosis  . Family history of breast cancer   . Family history of colon cancer   . Family history of lung cancer   . GERD (gastroesophageal reflux disease)    not presently having symptoms  . Lung cancer ([place]) 06/06/05   stage 1 poorly differentiated adenocarcinoma, s/p right lower lobectomy.  . Osteoporosis   . Personal history of lung cancer   . STD (sexually transmitted disease)    HSV    Past Surgical History:  Procedure Laterality Date  . BREAST BIOPSY     . BREAST LUMPECTOMY  10/12/2009   Left lumpectomy and radiation, stage II, ER/PR+, Her 2 nu negative  . BUNIONECTOMY    . COLONOSCOPY    . LOBECTOMY  06/06/2005   RLL  . TONSILLECTOMY    . TUBAL LIGATION  1984    Family History  Problem Relation Age of Onset  . Allergies Mother   . Asthma Mother   . Lung cancer Mother   . Breast cancer Mother 8       recurrence age 17  . Colon cancer Father 29  . Prostate cancer Brother 63  . Breast cancer Sister 54       Recurrence age 43 BRCA negative  . Colon polyps Sister   . Breast cancer Sister 64  . Prostate cancer Brother 13  . Breast cancer Maternal Grandmother 30  . Colon cancer Maternal Aunt   . Leukemia Maternal Grandfather   . Lung cancer Maternal Aunt   . Breast cancer Cousin   . Esophageal cancer Neg Hx   . Rectal cancer Neg Hx   . Stomach cancer Neg Hx    Social History:  reports that she quit smoking about 30 years ago. She has a 36.00 pack-year smoking history. She has never used smokeless tobacco. She reports that she drinks about 4.0 standard drinks of alcohol per week.  She reports that she does not use drugs.  Allergies:  Allergies  Allergen Reactions  . Clarithromycin Rash    Medications Prior to Admission  Medication Sig Dispense Refill  . acyclovir (ZOVIRAX) 400 MG tablet TAKE 1 TABLET BY MOUTH TWICE A DAY 90 tablet 0  . calcium gluconate 500 MG tablet Take 500 mg by mouth daily.    . Carboxymeth-Glycerin-Polysorb (REFRESH OPTIVE ADVANCED OP) Place 1 drop into both eyes daily.    . cholecalciferol (VITAMIN D) 1000 UNITS tablet Take 1,000 Units by mouth daily.    . Psyllium (METAMUCIL PO) Take 5 mLs by mouth daily.     . tamoxifen (NOLVADEX) 20 MG tablet Take 1 tablet (20 mg total) by mouth daily. 90 tablet 12  . vitamin C (ASCORBIC ACID) 500 MG tablet Take 500 mg by mouth daily.      Results for orders placed or performed during the hospital encounter of 03/15/18 (from the past 48 hour(s))  CBC      Status: Abnormal   Collection Time: 03/15/18  6:50 AM  Result Value Ref Range   WBC 4.0 4.0 - 10.5 K/uL   RBC 3.86 (L) 3.87 - 5.11 MIL/uL   Hemoglobin 11.8 (L) 12.0 - 15.0 g/dL   HCT 37.2 36.0 - 46.0 %   MCV 96.4 78.0 - 100.0 fL   MCH 30.6 26.0 - 34.0 pg   MCHC 31.7 30.0 - 36.0 g/dL   RDW 12.4 11.5 - 15.5 %   Platelets 241 150 - 400 K/uL    Comment: Performed at [facility] [address] [place], [place] [address]   No results found.  ROS Negative  Blood pressure (!) 162/87, pulse 76, temperature (!) 97.4 F (36.3 C), temperature source Oral, resp. rate 20, weight 53.5 kg, SpO2 100 %. Physical Exam  Vitals Geni Bers Haggett; 03/07/2018 4:18 PM) 03/07/2018 4:18 PM Weight: 120.4 lb Height: 66in Height was reported by patient. Body Surface Area: 1.61 m Body Mass Index: 19.43 kg/m  Pain Level: 0/10 Temp.: 97.75F(Temporal)  Pulse: 67 (Regular)  P.OX: 96% (Room air) BP: 140/60 (Sitting, Left Arm, Standard) Physical Exam Rolm Bookbinder MD; 03/07/2018 9:55 PM) Breast Note: changes c/w radiotherapy on left, no mass cv rrr Lungs clear  Assessment/Plan BREAST CANCER OF UPPER-OUTER QUADRANT OF RIGHT FEMALE BREAST (C50.411) Story: due to p53 mutation we discussed options. we have elected to proceed with right mastectomy and sn biopsy with port placement. she would also like to proceed with left mastectomy and I think this is reasonable. we discussed surgery, overnight stay, recovery,drains. risks include but not limited to wound issues, wound separation, flap loss especially on the radiated side. we will proceed in near future. she does not desire reconstruction LI-FRAUMENI SYNDROME (Z15.01)   Rolm Bookbinder, MD 03/15/2018, 7:18 AM

## 2018-03-15 NOTE — Progress Notes (Signed)
Summar was scheduled for bilateral mastectomies today.  I have rescheduled her to see me mid October.

## 2018-03-15 NOTE — Anesthesia Procedure Notes (Signed)
Anesthesia Regional Block: Pectoralis block   Pre-Anesthetic Checklist: ,, timeout performed, Correct Patient, Correct Site, Correct Laterality, Correct Procedure, Correct Position, risks and benefits discussed, surgical consent, pre-op evaluation,  At surgeon's request and post-op pain management  Laterality: Left  Prep: chloraprep       Needles:  Injection technique: Single-shot  Needle Type: Echogenic Stimulator Needle     Needle Length: 9cm  Needle Gauge: 21   Needle insertion depth: 5 cm   Additional Needles:   Procedures:,,,, ultrasound used (permanent image in chart),,,,  Narrative:  Start time: 03/15/2018 7:10 AM End time: 03/15/2018 7:15 AM Injection made incrementally with aspirations every 5 mL.  Performed by: Personally  Anesthesiologist: Josephine Igo, MD  Additional Notes: Timeout performed. Patient sedated. Relevant anatomy ID'd using [place]. Incremental 2-89ml injection of LA with frequent aspiration. Patient tolerated procedure well. o      Left Pectoralis Block

## 2018-03-15 NOTE — Anesthesia Procedure Notes (Signed)
Anesthesia Regional Block: Pectoralis block   Pre-Anesthetic Checklist: ,, timeout performed, Correct Patient, Correct Site, Correct Laterality, Correct Procedure, Correct Position, risks and benefits discussed, surgical consent, pre-op evaluation,  At surgeon's request and post-op pain management  Laterality: Right  Prep: chloraprep       Needles:  Injection technique: Single-shot  Needle Type: Echogenic Stimulator Needle     Needle Length: 9cm  Needle Gauge: 21   Needle insertion depth: 5 cm   Additional Needles:   Procedures:,,,, ultrasound used (permanent image in chart),,,,  Narrative:  Start time: 03/15/2018 7:05 AM End time: 03/15/2018 7:10 AM Injection made incrementally with aspirations every 5 mL.  Performed by: Personally  Anesthesiologist: Josephine Igo, MD  Additional Notes: Timeout performed. Patient sedated. Relevant anatomy ID'd using [place]. Incremental 2-45ml injection of LA with frequent aspiration. Patient tolerated procedure well.        Right Pectoralis Block

## 2018-03-15 NOTE — Op Note (Signed)
Preop diagnosis: p53 mutation, right breast cancer clinical stage I Postop diagnosis: same as above Procedure: 1. Left risk reducing mastectomy 2. Right total mastectomy 3. Right axillary sentinel node biopsy 4. Right IJ port placement with ultrasound guidance Surgeon: Dr Serita Grammes Asst: Sharyn Dross EBL Drains: 19 Fr blake drain to both sides Specimens: 1. Left mastectomy short superior long lateral 2. Right mastectomy short superior long lateral 3. Right axillary sentinel nodes with highest count of 161 Complications none Sponge and needle count correct times two dispo to recovery stable  Indications: This is a 57 yof who has prior lung cancer and a left breast cancer. She has a newly diagnosed right breast cancer.  She was found to have a p53 mutation. Due to this we discussed all options and she elected to proceed with bilateral mastectomy, right axillary sn biopsy and a port placement due to her2 positivity.    Procedure: After informed consent obtained patient was taken to the OR.  SCDs were in place. She underwent a bilateral pectoral block.  Antibiotics were administered.  She was placed under general anesthesia without complication. She was prepped and draped in standard sterile surgical fashion. A timeout was performed.  I first did the left mastectomy. I made an elliptical incision encompassing the nac.  I then created flaps to clavicle, parasternal fold, latissimus and the im fold inferiorly. I then removed the breast tissue and the fascia from the muscle.  This was marked as above and passed off the table.  I placed a 19 Fr Blake drain and secured this with a 2-0 nylon.  I obtained hemostasis. I closed with 3-0 vicryl and 4-0 monocryl.  I placed glue and steristrips.  I then did punch biopsies of her superior and medial radiation tattoos she wished removed. I closed these with 3-0 nylong.  I then did the right mastectomy in the same fashion. After removal of the breast I  marked as above. I then identified the sentinel nodes after opening the axilla. The highest count was as above. There appeared to be several there. There was no background radioactivity.  I then obtained hemostasis. A 19 Fr Blake drain was placed and secured with a  2-0 nylon suture.  I closed with 3-0 vicryl, 4-0 monocryl and glue.    We then reprepped and draped. Another timeout was performed.  I then identified the internal jugular vein with ultrasound.  I was able to access the vein easily but the wire would not pass despite multiple efforts.  I then under ultrasound attempted to reaccess the vein which was very superficial and flat.  This time I went through vein due to its size and accessed the carotid. I removed the needle and held pressure.  Then I was able to access the vein with needle and a wire passed after moving higher.  I then confirmed the wire to be in position with fluoroscopy.  I then made a pocket below the clavicle.  I tunneled the line between the two sites.  I placed the dilator under fluoro.  The line was then passed and the peel away sheath removed.  I pulled the line back into the cava.  I then connected this to the port and sutured the port into place.  This aspirated blood and flushed. I placed heparin in it.  I then closed wound with 3-0 vicryl, 4-0 monocryl and glue She tolerated well and will be transferred to pacu stable.

## 2018-03-16 ENCOUNTER — Inpatient Hospital Stay: Payer: Medicare Other | Admitting: Oncology

## 2018-03-16 ENCOUNTER — Inpatient Hospital Stay: Payer: Medicare Other | Attending: Oncology

## 2018-03-16 ENCOUNTER — Other Ambulatory Visit: Payer: Self-pay | Admitting: Oncology

## 2018-03-16 ENCOUNTER — Encounter (HOSPITAL_COMMUNITY): Payer: Self-pay | Admitting: General Surgery

## 2018-03-16 DIAGNOSIS — Z4001 Encounter for prophylactic removal of breast: Secondary | ICD-10-CM | POA: Diagnosis not present

## 2018-03-16 DIAGNOSIS — C50412 Malignant neoplasm of upper-outer quadrant of left female breast: Secondary | ICD-10-CM | POA: Diagnosis not present

## 2018-03-16 DIAGNOSIS — N6081 Other benign mammary dysplasias of right breast: Secondary | ICD-10-CM | POA: Diagnosis not present

## 2018-03-16 DIAGNOSIS — J439 Emphysema, unspecified: Secondary | ICD-10-CM | POA: Diagnosis not present

## 2018-03-16 DIAGNOSIS — N6032 Fibrosclerosis of left breast: Secondary | ICD-10-CM | POA: Diagnosis not present

## 2018-03-16 DIAGNOSIS — N6011 Diffuse cystic mastopathy of right breast: Secondary | ICD-10-CM | POA: Diagnosis not present

## 2018-03-16 MED ORDER — OXYCODONE HCL 5 MG PO TABS: 5.0000 mg | ORAL_TABLET | ORAL | 0 refills | Status: DC | PRN

## 2018-03-16 MED ORDER — METHOCARBAMOL 500 MG PO TABS: 500.0000 mg | ORAL_TABLET | Freq: Four times a day (QID) | ORAL | 0 refills | Status: DC | PRN

## 2018-03-16 NOTE — Discharge Instructions (Signed)
[facility], [place] [phone]  MASTECTOMY: POST OP INSTRUCTIONS Take 400 mg of ibuprofen every 8 hours or 650 mg tylenol every 6 hours for next 72 hours then as needed. Use ice several times daily also. May shower 48 hours after surgery  Always review your discharge instruction sheet given to you by the facility where your surgery was performed. IF YOU HAVE DISABILITY OR FAMILY LEAVE FORMS, YOU MUST BRING THEM TO THE OFFICE FOR PROCESSING.   DO NOT GIVE THEM TO YOUR DOCTOR. A prescription for pain medication may be given to you upon discharge.  Take your pain medication as prescribed, if needed.  If narcotic pain medicine is not needed, then you may take acetaminophen (Tylenol), naprosyn (Alleve) or ibuprofen (Advil) as needed. 1. Take your usually prescribed medications unless otherwise directed. 2. If you need a refill on your pain medication, please contact your pharmacy.  They will contact our office to request authorization.  Prescriptions will not be filled after 5pm or on week-ends. 3. You should follow a light diet the first few days after arrival home, such as soup and crackers, etc.  Resume your normal diet the day after surgery. 4. Most patients will experience some swelling and bruising on the chest and underarm.  Ice packs will help.  Swelling and bruising can take several days to resolve. Wear the binder day and night until you return to the office.  5. It is common to experience some constipation if taking pain medication after surgery.  Increasing fluid intake and taking a stool softener (such as Colace) will usually help or prevent this problem from occurring.  A mild laxative (Milk of Magnesia or Miralax) should be taken according to package instructions if there are no bowel movements after 48 hours. 6. Unless discharge instructions indicate otherwise, leave your bandage dry and in place until your next appointment in 3-5 days.  You may take a limited sponge bath.   No tube baths or showers until the drains are removed.  You may have steri-strips (small skin tapes) in place directly over the incision.  These strips should be left on the skin for 7-10 days. If you have glue it will come off in next couple week.  Any sutures will be removed at an office visit 7. DRAINS:  If you have drains in place, it is important to keep a list of the amount of drainage produced each day in your drains.  Before leaving the hospital, you should be instructed on drain care.  Call our office if you have any questions about your drains. I will remove your drains when they put out less than 30 cc or ml for 2 consecutive days. 8. ACTIVITIES:  You may resume regular (light) daily activities beginning the next day--such as daily self-care, walking, climbing stairs--gradually increasing activities as tolerated.  You may have sexual intercourse when it is comfortable.  Refrain from any heavy lifting or straining until approved by your doctor. a. You may drive when you are no longer taking prescription pain medication, you can comfortably wear a seatbelt, and you can safely maneuver your car and apply brakes. b. RETURN TO WORK:  __________________________________________________________ 9. You should see your doctor in the office for a follow-up appointment approximately 3-5 days after your surgery.  Your doctors nurse will typically make your follow-up appointment when she calls you with your pathology report.  Expect your pathology report 3-4business days after surgery. 10. OTHER INSTRUCTIONS: ______________________________________________________________________________________________ ____________________________________________________________________________________________ WHEN TO CALL YOUR  DR Verdie Wilms: 1. Fever over 101.0 2. Nausea and/or vomiting 3. Extreme swelling or bruising 4. Continued bleeding from incision. 5. Increased pain, redness, or drainage from the incision. The  clinic staff is available to answer your questions during regular business hours.  Please dont hesitate to call and ask to speak to one of the nurses for clinical concerns.  If you have a medical emergency, go to the nearest emergency room or call 911.  A surgeon from [facility] is always on call at the hospital. [address] [place], [place], [place]  [address] ? [address], [place], [place]   [address] [phone] ? [phone] ? FAX [phone] Web site: [email or web address]

## 2018-03-16 NOTE — Discharge Summary (Signed)
Physician Discharge Summary  Patient ID: Anna Valentine MRN: [id number] DOB/AGE: 66/26/1952 66 y.o.  Admit date: 03/15/2018 Discharge date: 03/16/2018  Admission Diagnoses: Breast cancer p53 mutation  Discharge Diagnoses:  Active Problems:   Breast cancer, right [facility])   Discharged Condition: good  Hospital Course: 3 yof underwent bilateral total mastectomies with right axillary sn biopsy and port placement. Doing well the following am and ready for dc  Consults: None  Significant Diagnostic Studies: none  Treatments: surgery: bilateral total mastectomies, right ax sn biopsy, port placement  Discharge Exam: Blood pressure 125/74, pulse 72, temperature (!) 97.5 F (36.4 C), temperature source Oral, resp. rate 14, height '5\' 6"'  (1.676 m), weight 59.8 kg, SpO2 98 %. bilateral incisions without drainage, flaps viable, no hematoma, port fine  Disposition: Discharge disposition: 01-Home or Self Care        Allergies as of 03/16/2018      Reactions   Clarithromycin Rash      Medication List    STOP taking these medications   tamoxifen 20 MG tablet Commonly known as:  NOLVADEX     TAKE these medications   acyclovir 400 MG tablet Commonly known as:  ZOVIRAX TAKE 1 TABLET BY MOUTH TWICE A DAY   calcium gluconate 500 MG tablet Take 500 mg by mouth daily.   cholecalciferol 1000 units tablet Commonly known as:  VITAMIN D Take 1,000 Units by mouth daily.   METAMUCIL PO Take 5 mLs by mouth daily.   methocarbamol 500 MG tablet Commonly known as:  ROBAXIN Take 1 tablet (500 mg total) by mouth every 6 (six) hours as needed for muscle spasms.   oxyCODONE 5 MG immediate release tablet Commonly known as:  Oxy IR/ROXICODONE Take 1 tablet (5 mg total) by mouth every 4 (four) hours as needed for moderate pain.   REFRESH OPTIVE ADVANCED OP Place 1 drop into both eyes daily.   vitamin C 500 MG tablet Commonly known as:  ASCORBIC ACID Take 500 mg by mouth daily.       Follow-up Information    Rolm Bookbinder, MD In 1 week.   Specialty:  General Surgery Contact information: [address] [place] [address] [phone]           Signed: Rolm Bookbinder 03/16/2018, 7:04 AM

## 2018-03-16 NOTE — Progress Notes (Signed)
Patient was given discharge instructions, patient verbalized understanding. Education for JP drain was reviewed and patient as well as spouse verbalized understanding. Patient left unit in stable condition with nursing staff.

## 2018-03-19 ENCOUNTER — Telehealth: Payer: Self-pay | Admitting: Oncology

## 2018-03-19 NOTE — Telephone Encounter (Signed)
Appt scheduled patient notified per 9/6 sch msg

## 2018-03-20 ENCOUNTER — Encounter (HOSPITAL_COMMUNITY): Payer: Self-pay | Admitting: General Surgery

## 2018-04-04 ENCOUNTER — Encounter: Payer: Self-pay | Admitting: Physical Therapy

## 2018-04-04 ENCOUNTER — Ambulatory Visit: Payer: Medicare Other | Attending: General Surgery | Admitting: Physical Therapy

## 2018-04-04 DIAGNOSIS — R222 Localized swelling, mass and lump, trunk: Secondary | ICD-10-CM

## 2018-04-04 DIAGNOSIS — Z483 Aftercare following surgery for neoplasm: Secondary | ICD-10-CM | POA: Diagnosis not present

## 2018-04-04 DIAGNOSIS — M25611 Stiffness of right shoulder, not elsewhere classified: Secondary | ICD-10-CM | POA: Diagnosis not present

## 2018-04-04 DIAGNOSIS — M25612 Stiffness of left shoulder, not elsewhere classified: Secondary | ICD-10-CM | POA: Diagnosis not present

## 2018-04-04 NOTE — Therapy (Signed)
[place], [place], [address] Phone: [phone]   Fax:  [phone]  Physical Therapy Evaluation  Patient Details  Name: Anna Valentine MRN: [id number] Date of Birth: 07/18/1950 Referring Provider: Dr. Donne Hazel    Encounter Date: 04/04/2018  PT End of Session - 04/04/18 1630    Visit Number  1    Number of Visits  9    Date for PT Re-Evaluation  05/04/18    PT Start Time  1440    PT Stop Time  1525    PT Time Calculation (min)  45 min    Activity Tolerance  Patient tolerated treatment well    Behavior During Therapy  [facility] for tasks assessed/performed       Past Medical History:  Diagnosis Date  . Anemia   . BRCA negative 2011   BRCA I/ II negative  . Breast cancer ([place]) 08/2009   stage 2, rx with lumpectomy and xrt  . COPD (chronic obstructive pulmonary disease) ([facility])    pt.unsure of diagnosis status  . Emphysema of lung ([place])    pt. questions diagnosis  . Family history of adverse reaction to anesthesia    My Sister has nausea  . Family history of breast cancer   . Family history of colon cancer   . Family history of lung cancer   . GERD (gastroesophageal reflux disease)    not presently having symptoms  . Lung cancer ([place]) 06/06/05   stage 1 poorly differentiated adenocarcinoma, s/p right lower lobectomy.  . Osteoporosis   . Personal history of lung cancer   . STD (sexually transmitted disease)    HSV    Past Surgical History:  Procedure Laterality Date  . BREAST BIOPSY    . BREAST LUMPECTOMY  10/12/2009   Left lumpectomy and radiation, stage II, ER/PR+, Her 2 nu negative  . BUNIONECTOMY    . COLONOSCOPY    . LOBECTOMY  06/06/2005   RLL  . MASTECTOMY W/ SENTINEL NODE BIOPSY Bilateral 03/15/2018  . MASTECTOMY W/ SENTINEL NODE BIOPSY Bilateral 03/15/2018   Procedure: BILATERAL TOTAL MASTECTOMIES WITH RIGHT SENTINEL LYMPH NODE BIOPSY;  Surgeon: Rolm Bookbinder, MD;  Location:  [place];  Service: General;  Laterality: Bilateral;  . PORTACATH PLACEMENT N/A 03/15/2018   Procedure: INSERTION PORT-A-CATH WITH [place];  Surgeon: Rolm Bookbinder, MD;  Location: [place];  Service: General;  Laterality: N/A;  . TONSILLECTOMY    . TUBAL LIGATION  1984    There were no vitals filed for this visit.   Subjective Assessment - 04/04/18 1448    Subjective  Pt reports she is having "nerve" pain across both incisions .    Pertinent History   New right breast cancer diagnosed on 01/25/2018.  She underwent a bilateral mastectomy wit 6 lymph nodes removed on the right on 03/15/2018. 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  She also had a port placed on the right side at that time.   Pt had previous left breast cancer 2008 with sentinel node removal and radiation, lung cancer in 2006 She has history of frozen shoulder on the right that has resolved and the start of frozen shoulder on the left that resolved with radiation.  She has LFS .    Patient Stated Goals  to keep a good posture and get rid of the pain     Currently in Pain?  No/denies   at this moment  Pain Score  7    at times during the day    Pain Location  Chest    Pain Orientation  Right;Left    Pain Descriptors / Indicators  Burning    Pain Type  Surgical pain    Pain Onset  1 to 4 weeks ago    Pain Frequency  Intermittent    Aggravating Factors   anything tight around her chest or contact with anything     Pain Relieving Factors  having nothing on her skin          [facility] PT Assessment - 04/04/18 0001      Assessment   Medical Diagnosis  breast cancer     Referring Provider  Dr. Donne Hazel     Onset Date/Surgical Date  03/15/18      Precautions   Precautions  None    Precaution Comments  recent surgery       Restrictions   Weight Bearing Restrictions  No      Balance Screen   Has the patient fallen in the past 6 months  No    Has the patient had a decrease in activity  level because of a fear of falling?   No    Is the patient reluctant to leave their home because of a fear of falling?   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 Pt with visible puffy areas above both incisions at lateral chest near axilla. right axilla appears to have tissue tightness  Skin     Skin Integrity  no open areas     Quick DASH   27.27       Observation/Other Assessments-Edema    Edema  --   yes visible at proximal chest      Sensation   Light Touch  Not tested      Coordination   Gross Motor Movements are Fluid and Coordinated  Yes      Posture/Postural Control   Posture/Postural Control  Postural limitations    Postural Limitations  Rounded Shoulders;Forward head;Increased lumbar lordosis    Posture Comments  possibly scoliosis visible at lumbar area pt reports she has had it for a long time and it seemed to bother her more whn she was yoounger, it does not bother her any longer       AROM   Right Shoulder Flexion  140 Degrees    Right Shoulder ABduction  140 Degrees    Right Shoulder External Rotation  90 Degrees    Left Shoulder Flexion  130 Degrees    Left Shoulder ABduction  140 Degrees    Left Shoulder External Rotation  90 Degrees  Strength   Overall Strength Comments  limited by pain at both shoulders        Palpation   Palpation comment  pt has very tight adherent healing incisions at both sides of chest.  tighness in right axilla with possible axillary cording but no report of  pulling down into upper arm, elbow or forearm         LYMPHEDEMA/ONCOLOGY QUESTIONNAIRE - 04/04/18 1511      Type   Cancer Type  right breast, history of left breast cancer with radiation and lung cancer       Surgeries   Mastectomy Date  03/15/18    Number Lymph Nodes Removed  6      Treatment   Past Radiation Treatment  Yes    Date  07/11/06    Body Site  left upper chest       What other symptoms do you have   Are you Having Heaviness or Tightness  Yes    Are you having Pain  Yes    Are you having pitting edema  No    Is it Hard or Difficult finding clothes that fit  No    Do you have infections  No    Is there Decreased scar mobility  Yes    Stemmer Sign  No      Lymphedema Stage   Stage  --   no UE lymphedema      Right Upper Extremity Lymphedema   10 cm Proximal to Olecranon Process  23.5 cm    Olecranon Process  22 cm    10 cm Proximal to Ulnar Styloid Process  20.5 cm    Just Proximal to Ulnar Styloid Process  14 cm    Across Hand at [employer]  18 cm    At [place] of 2nd Digit  5.5 cm      Left Upper Extremity Lymphedema   10 cm Proximal to Olecranon Process  23 cm    Olecranon Process  22.5 cm    10 cm Proximal to Ulnar Styloid Process  20.5 cm    Just Proximal to Ulnar Styloid Process  14.5 cm    Across Hand at [employer]  17.5 cm    At [place] of 2nd Digit  5.5 cm          Quick Dash - 04/04/18 0001    Open a tight or new jar  Moderate difficulty    Do heavy household chores (wash walls, wash floors)  Mild difficulty    Carry a shopping bag or briefcase  No difficulty    Wash your back  No difficulty    Use a knife to cut food  No difficulty    Recreational activities in which you take some force or impact through your arm, shoulder, or hand (golf, hammering, tennis)   Unable    During the past week, to what extent has your arm, shoulder or hand problem interfered with your normal social activities with family, friends, neighbors, or groups?  Slightly    During the past week, to what extent has your arm, shoulder or hand problem limited your work or other regular daily activities  Modererately    Arm, shoulder, or hand pain.  Moderate    Tingling (pins and needles) in your arm, shoulder, or hand  None    Difficulty Sleeping  No difficulty    DASH Score  27.27 %  Objective measurements completed on examination: See above findings.      [facility] Adult PT Treatment/Exercise - 04/04/18 0001      Exercises   Exercises  Shoulder      Shoulder Exercises: Supine   Other Supine Exercises  supine dowel flexin and abduction              PT Education - 04/04/18 1629    Education Details  dowel rod exercises in supine     Person(s) Educated  Patient    Methods  Explanation;Demonstration;Handout    Comprehension  Verbalized understanding;Returned demonstration          PT Long Term Goals - 04/04/18 1643      PT LONG TERM GOAL #1   Title  Pt will report pain is reduced by 50%    Time  4    Period  Weeks    Status  New      PT LONG TERM GOAL #2   Title  Pt will have 150 degrees of bilateral shoulder abduction so that she can return to all household activities at home     Time  4    Period  Weeks    Status  New      PT LONG TERM GOAL #3   Title  Pt will reduce quick DASH score to < 10 indicating a functional improvment of UEs    Baseline  27.27 on 04/04/2018    Time  4    Period  Weeks    Status  New      PT LONG TERM GOAL #4   Title  Pt will vebalize understanding of lymphedema risk reduction practices     Time  4    Period  Weeks    Status  New             Plan - 04/04/18 1632    Clinical Impression Statement  67 yo female with past history of lung cancer, left breast cancer with sentinel node biopsy and radiation,  right frozen shoulder with complete resolution presents with bilateral mastectomy with 6 nodes removed from the right side . Her main complaint is of burning "nerve" pain across the both incisions.  She presents with fullness proximal to both incisions at lateral chest  and tightness in both shoulders with decreased range of motion and strength. Her healing incisions are tight to her chest     History and Personal Factors relevant to plan of care:  previous cancers, difficult to control pain     Clinical Presentation  Evolving    Clinical Presentation due to:  may be having chemotherapy , port has not been accessed yet     Clinical Decision Making  Moderate    Rehab Potential  Good    Clinical Impairments Affecting Rehab Potential  recent surgery    PT Frequency  2x / week    PT Duration  4 weeks    PT Treatment/Interventions  ADLs/Self Care Home Management;Manual techniques;Orthotic Fit/Training;Patient/family education;Manual lymph drainage;Compression bandaging;Scar mobilization;Passive range of motion    PT Next Visit Plan  manual lymph drainage to bilateral upper chest without use of either axillar nodes, ( send to back) progressive range of motion and strength Teach HEP     PT Home Exercise Plan  supine dowel rod exercise     Consulted and Agree with Plan of Care  Patient       Patient will benefit from skilled therapeutic intervention in order to improve  the following deficits and impairments:  Pain, Impaired sensation, Increased fascial restricitons, Decreased scar mobility, Postural dysfunction, Decreased range of motion, Decreased strength, Impaired UE functional use, Increased edema  Visit Diagnosis: Aftercare following surgery for neoplasm - Plan: PT plan of care cert/re-cert  Stiffness of right shoulder, not elsewhere classified - Plan: PT plan of care cert/re-cert  Stiffness of left shoulder, not elsewhere classified - Plan: PT plan of care cert/re-cert  Localized swelling,  mass and lump, trunk - Plan: PT plan of care cert/re-cert     Problem List Patient Active Problem List   Diagnosis Date Noted  . Breast cancer, right ([place]) 03/15/2018  . Genetic testing 02/20/2018  . Family history of breast cancer   . Family history of lung cancer   . Personal history of lung cancer   . Chest pain 11/28/2017  . Abnormal CT of the chest 11/30/2016  . Malignant neoplasm of upper-inner quadrant of left breast in female, estrogen receptor positive ([place]) 06/16/2014  . Osteopenia 09/13/2013  . Postmenopausal atrophic vaginitis 09/13/2013  . Unspecified vitamin D deficiency 09/13/2013  . Malignant neoplasm of lower lobe of right lung ([place]) 07/24/2012  . History of lung cancer 03/11/2011  . Anxiety 03/11/2011  . History of neutropenia 03/11/2011  . Family history of colon cancer 03/11/2011  . Cough 11/15/2010  . Bronchiectasis without acute exacerbation ([place]) 11/15/2010   Donato Heinz. Owens Shark PT  Norwood Levo 04/04/2018, 4:48 PM  [place], [place], [address] Phone: [phone]   Fax:  [phone]  Name: TANINA BARB MRN: [id number] Date of Birth: [birth year]

## 2018-04-04 NOTE — Patient Instructions (Signed)
SHOULDER: Flexion - Supine (Cane)        Cancer Rehab [phone]    Hold cane in both hands. Raise arms up overhead. Do not allow back to arch. Hold _5__ seconds. Do __5-10__ times; __1-2__ times a day.   SELF ASSISTED WITH OBJECT: Shoulder Abduction / Adduction - Supine    Hold cane with both hands. Move both arms from side to side, keep elbows straight.  Hold when stretch felt for __5__ seconds. Repeat __5-10__ times; __1-2__ times a day. Once this becomes easier progress to third picture bringing affected arm towards ear by staying out to side. Same hold for _5_seconds. Repeat  _5-10_ times, _1-2_ times/day.

## 2018-04-09 ENCOUNTER — Ambulatory Visit: Payer: Medicare Other

## 2018-04-09 DIAGNOSIS — Z483 Aftercare following surgery for neoplasm: Secondary | ICD-10-CM

## 2018-04-09 DIAGNOSIS — M25611 Stiffness of right shoulder, not elsewhere classified: Secondary | ICD-10-CM | POA: Diagnosis not present

## 2018-04-09 DIAGNOSIS — M25612 Stiffness of left shoulder, not elsewhere classified: Secondary | ICD-10-CM

## 2018-04-09 DIAGNOSIS — R222 Localized swelling, mass and lump, trunk: Secondary | ICD-10-CM

## 2018-04-09 NOTE — Therapy (Signed)
[place], [place], [address] Phone: [phone]   Fax:  [phone]  Physical Therapy Treatment  Patient Details  Name: Anna Valentine MRN: [id number] Date of Birth: Mar 18, 1951 Referring Provider (PT): Dr. Donne Hazel    Encounter Date: 04/09/2018  PT End of Session - 04/09/18 0937    Visit Number  2    Number of Visits  9    Date for PT Re-Evaluation  05/04/18    PT Start Time  3474    PT Stop Time  0937    PT Time Calculation (min)  50 min    Activity Tolerance  Patient tolerated treatment well    Behavior During Therapy  [facility] for tasks assessed/performed       Past Medical History:  Diagnosis Date  . Anemia   . BRCA negative 2011   BRCA I/ II negative  . Breast cancer ([place]) 08/2009   stage 2, rx with lumpectomy and xrt  . COPD (chronic obstructive pulmonary disease) (HCC)    pt.unsure of diagnosis status  . Emphysema of lung ([place])    pt. questions diagnosis  . Family history of adverse reaction to anesthesia    My Sister has nausea  . Family history of breast cancer   . Family history of colon cancer   . Family history of lung cancer   . GERD (gastroesophageal reflux disease)    not presently having symptoms  . Lung cancer ([place]) 06/06/05   stage 1 poorly differentiated adenocarcinoma, s/p right lower lobectomy.  . Osteoporosis   . Personal history of lung cancer   . STD (sexually transmitted disease)    HSV    Past Surgical History:  Procedure Laterality Date  . BREAST BIOPSY    . BREAST LUMPECTOMY  10/12/2009   Left lumpectomy and radiation, stage II, ER/PR+, Her 2 nu negative  . BUNIONECTOMY    . COLONOSCOPY    . LOBECTOMY  06/06/2005   RLL  . MASTECTOMY W/ SENTINEL NODE BIOPSY Bilateral 03/15/2018  . MASTECTOMY W/ SENTINEL NODE BIOPSY Bilateral 03/15/2018   Procedure: BILATERAL TOTAL MASTECTOMIES WITH RIGHT SENTINEL LYMPH NODE BIOPSY;  Surgeon: Rolm Bookbinder, MD;   Location: [place];  Service: General;  Laterality: Bilateral;  . PORTACATH PLACEMENT N/A 03/15/2018   Procedure: INSERTION PORT-A-CATH WITH [place];  Surgeon: Rolm Bookbinder, MD;  Location: [place];  Service: General;  Laterality: N/A;  . TONSILLECTOMY    . TUBAL LIGATION  1984    There were no vitals filed for this visit.  Subjective Assessment - 04/09/18 0849    Subjective  I've been doing the HEP she gave me last time and I can tell it's helping but the burning, nerve pain is about the same at my chest wall though.     Pertinent History   New right breast cancer diagnosed on 01/25/2018.  She underwent a bilateral mastectomy wit 6 lymph nodes removed on the right on 03/15/2018. 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  She also had a port placed on the right side at that time.   Pt had previous left breast cancer 2008 with sentinel node removal and radiation, lung cancer in 2006 She has history of frozen shoulder on the right that has resolved and the start of frozen shoulder on the left that resolved with radiation.  She has LFS .    Patient Stated Goals  to keep a good posture  and get rid of the pain     Currently in Pain?  No/denies                       [facility] Adult PT Treatment/Exercise - 04/09/18 0001      Manual Therapy   Manual Therapy  Myofascial release;Manual Lymphatic Drainage (MLD);Passive ROM    Myofascial Release  To bil axillary nodes during stretching    Manual Lymphatic Drainage (MLD)  In Supine: Short neck, 5 diaphragmatic breaths, Rt inguinal nodes then Rt axillo-inguinal anasotmosis and Rt upper chest redirecting to anastomosis then same on lt side.    Passive ROM  In Supine to bil shoulders into flexion, abduction and D2 to pts tolerance                  PT Long Term Goals - 04/04/18 1643      PT LONG TERM GOAL #1   Title  Pt will report pain is reduced by 50%    Time  4    Period  Weeks    Status  New       PT LONG TERM GOAL #2   Title  Pt will have 150 degrees of bilateral shoulder abduction so that she can return to all household activities at home     Time  4    Period  Weeks    Status  New      PT LONG TERM GOAL #3   Title  Pt will reduce quick DASH score to < 10 indicating a functional improvment of UEs    Baseline  27.27 on 04/04/2018    Time  4    Period  Weeks    Status  New      PT LONG TERM GOAL #4   Title  Pt will vebalize understanding of lymphedema risk reduction practices     Time  4    Period  Weeks    Status  New            Plan - 04/09/18 1224    Clinical Impression Statement  Pt tolerated first session of manual therapy well. Did not do myofascial release to incisions yet as pt isn't quite 4 weeks out from surgery. Focused instead on manual lymph drainage to bil inguinal nodes for chest swelling and bil shoulder P/ROM with myofscial release to axillae where tight. Pt reported feeling looser at end of session. Also instructed her during session on importance of erect posture throughout day and working in backward shoulder rolls and scapular rertaction often to help counteract tightness at chest caused from posturing since surgery.     Rehab Potential  Good    Clinical Impairments Affecting Rehab Potential  recent surgery    PT Frequency  2x / week    PT Duration  4 weeks    PT Treatment/Interventions  ADLs/Self Care Home Management;Manual techniques;Orthotic Fit/Training;Patient/family education;Manual lymph drainage;Compression bandaging;Scar mobilization;Passive range of motion    PT Next Visit Plan  manual lymph drainage to bilateral upper chest without use of either axillar nodes, ( send to bil inguinal nodes)) progressive range of motion and strength. Teach HEP     Consulted and Agree with Plan of Care  Patient       Patient will benefit from skilled therapeutic intervention in order to improve the following deficits and impairments:  Pain, Impaired  sensation, Increased fascial restricitons, Decreased scar mobility, Postural dysfunction, Decreased range of motion, Decreased strength, Impaired  UE functional use, Increased edema  Visit Diagnosis: Aftercare following surgery for neoplasm  Stiffness of right shoulder, not elsewhere classified  Stiffness of left shoulder, not elsewhere classified  Localized swelling, mass and lump, trunk     Problem List Patient Active Problem List   Diagnosis Date Noted  . Breast cancer, right ([place]) 03/15/2018  . Genetic testing 02/20/2018  . Family history of breast cancer   . Family history of lung cancer   . Personal history of lung cancer   . Chest pain 11/28/2017  . Abnormal CT of the chest 11/30/2016  . Malignant neoplasm of upper-inner quadrant of left breast in female, estrogen receptor positive ([place]) 06/16/2014  . Osteopenia 09/13/2013  . Postmenopausal atrophic vaginitis 09/13/2013  . Unspecified vitamin D deficiency 09/13/2013  . Malignant neoplasm of lower lobe of right lung ([place]) 07/24/2012  . History of lung cancer 03/11/2011  . Anxiety 03/11/2011  . History of neutropenia 03/11/2011  . Family history of colon cancer 03/11/2011  . Cough 11/15/2010  . Bronchiectasis without acute exacerbation ([place]) 11/15/2010    Otelia Limes, PTA 04/09/2018, 12:29 PM  [place], [place], [address] Phone: [phone]   Fax:  [phone]  Name: Anna Valentine MRN: [id number] Date of Birth: 24-Oct-1950

## 2018-04-11 ENCOUNTER — Other Ambulatory Visit: Payer: Self-pay | Admitting: Internal Medicine

## 2018-04-12 ENCOUNTER — Ambulatory Visit: Payer: Medicare Other | Attending: General Surgery | Admitting: Physical Therapy

## 2018-04-12 ENCOUNTER — Encounter: Payer: Self-pay | Admitting: Physical Therapy

## 2018-04-12 ENCOUNTER — Other Ambulatory Visit: Payer: Self-pay

## 2018-04-12 DIAGNOSIS — Z483 Aftercare following surgery for neoplasm: Secondary | ICD-10-CM | POA: Diagnosis not present

## 2018-04-12 DIAGNOSIS — R222 Localized swelling, mass and lump, trunk: Secondary | ICD-10-CM | POA: Diagnosis not present

## 2018-04-12 DIAGNOSIS — M25612 Stiffness of left shoulder, not elsewhere classified: Secondary | ICD-10-CM | POA: Diagnosis not present

## 2018-04-12 DIAGNOSIS — M25611 Stiffness of right shoulder, not elsewhere classified: Secondary | ICD-10-CM | POA: Insufficient documentation

## 2018-04-12 NOTE — Therapy (Signed)
[place], [place], [address] Phone: [phone]   Fax:  [phone]  Physical Therapy Treatment  Patient Details  Name: Anna Valentine MRN: [id number] Date of Birth: Jan 22, 1951 Referring Provider (PT): Dr. Donne Hazel    Encounter Date: 04/12/2018  PT End of Session - 04/12/18 1149    Visit Number  3    Number of Visits  9    Date for PT Re-Evaluation  05/04/18    PT Start Time  8299    PT Stop Time  1104    PT Time Calculation (min)  49 min    Activity Tolerance  Patient tolerated treatment well    Behavior During Therapy  [facility] for tasks assessed/performed       Past Medical History:  Diagnosis Date  . Anemia   . BRCA negative 2011   BRCA I/ II negative  . Breast cancer ([place]) 08/2009   stage 2, rx with lumpectomy and xrt  . COPD (chronic obstructive pulmonary disease) (HCC)    pt.unsure of diagnosis status  . Emphysema of lung ([place])    pt. questions diagnosis  . Family history of adverse reaction to anesthesia    My Sister has nausea  . Family history of breast cancer   . Family history of colon cancer   . Family history of lung cancer   . GERD (gastroesophageal reflux disease)    not presently having symptoms  . Lung cancer ([place]) 06/06/05   stage 1 poorly differentiated adenocarcinoma, s/p right lower lobectomy.  . Osteoporosis   . Personal history of lung cancer   . STD (sexually transmitted disease)    HSV    Past Surgical History:  Procedure Laterality Date  . BREAST BIOPSY    . BREAST LUMPECTOMY  10/12/2009   Left lumpectomy and radiation, stage II, ER/PR+, Her 2 nu negative  . BUNIONECTOMY    . COLONOSCOPY    . LOBECTOMY  06/06/2005   RLL  . MASTECTOMY W/ SENTINEL NODE BIOPSY Bilateral 03/15/2018  . MASTECTOMY W/ SENTINEL NODE BIOPSY Bilateral 03/15/2018   Procedure: BILATERAL TOTAL MASTECTOMIES WITH RIGHT SENTINEL LYMPH NODE BIOPSY;  Surgeon: Rolm Bookbinder, MD;   Location: [place];  Service: General;  Laterality: Bilateral;  . PORTACATH PLACEMENT N/A 03/15/2018   Procedure: INSERTION PORT-A-CATH WITH [place];  Surgeon: Rolm Bookbinder, MD;  Location: [place];  Service: General;  Laterality: N/A;  . TONSILLECTOMY    . TUBAL LIGATION  1984    There were no vitals filed for this visit.  Subjective Assessment - 04/12/18 1020    Subjective  I feel like either taking some xanax or doing physical therapy is making my nerve pain better.    Pertinent History   New right breast cancer diagnosed on 01/25/2018.  She underwent a bilateral mastectomy wit 6 lymph nodes removed on the right on 03/15/2018. 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  She also had a port placed on the right side at that time.   Pt had previous left breast cancer 2008 with sentinel node removal and radiation, lung cancer in 2006 She has history of frozen shoulder on the right that has resolved and the start of frozen shoulder on the left that resolved with radiation.  She has LFS .    Patient Stated Goals  to keep a good posture and get rid of the pain     Currently in Pain?  Yes    Pain Score  4     Pain Location  Chest    Pain Orientation  Left;Right    Pain Descriptors / Indicators  Burning    Pain Type  Neuropathic pain;Surgical pain    Pain Onset  1 to 4 weeks ago    Pain Frequency  Intermittent    Aggravating Factors   unknown    Pain Relieving Factors  Holding pillow against chest                       [facility] Adult PT Treatment/Exercise - 04/12/18 0001      Exercises   Exercises  Shoulder      Shoulder Exercises: Standing   Other Standing Exercises  Standing with back against wall while sliding arms into abduction as tolerated; verbal cues with all shoulder exercises for erect posture and to prevent comensation with lower back.      Shoulder Exercises: Pulleys   Flexion  2 minutes    Flexion Limitations  Limited by pain at end  ROM    ABduction  2 minutes    ABduction Limitations  Limited by pain at end ROM      Manual Therapy   Manual Therapy  Myofascial release;Passive ROM;Neural Stretch    Myofascial Release  To chest and incision supine to tolerance    Passive ROM  In Supine to bil shoulders into flexion, abduction and D2 to pts tolerance    Neural Stretch  To right UE in supine             PT Education - 04/12/18 1144    Education Details  Closed chain shoulder stretch    Person(s) Educated  Patient    Methods  Explanation;Demonstration;Handout    Comprehension  Returned demonstration;Verbalized understanding          PT Long Term Goals - 04/04/18 1643      PT LONG TERM GOAL #1   Title  Pt will report pain is reduced by 50%    Time  4    Period  Weeks    Status  New      PT LONG TERM GOAL #2   Title  Pt will have 150 degrees of bilateral shoulder abduction so that she can return to all household activities at home     Time  4    Period  Weeks    Status  New      PT LONG TERM GOAL #3   Title  Pt will reduce quick DASH score to < 10 indicating a functional improvment of UEs    Baseline  27.27 on 04/04/2018    Time  4    Period  Weeks    Status  New      PT LONG TERM GOAL #4   Title  Pt will vebalize understanding of lymphedema risk reduction practices     Time  4    Period  Weeks    Status  New            Plan - 04/12/18 1150    Clinical Impression Statement  Patient with increased ROM in bil shoulders by end of session. Some improvement in hypersensitivity on chest after PT appplied pressure with myofacial release to incision sites. Much discussion today about how to improve scar tissue mobility and pt was educated on using coconut lotion/oil to reduce tightness (during manual therapy). She had good tolerance of exercises and will  benefit from continued PT to improve ROM.    Rehab Potential  Excellent    PT Frequency  2x / week    PT Duration  4 weeks    PT  Treatment/Interventions  ADLs/Self Care Home Management;Manual techniques;Orthotic Fit/Training;Patient/family education;Manual lymph drainage;Compression bandaging;Scar mobilization;Passive range of motion    PT Next Visit Plan  Review all HEP given in last 2 visits; continue PROM bil shoulders and bil chest scar massage/myofascial release    PT Home Exercise Plan  Closed chain shoulder flexion and abduction    Consulted and Agree with Plan of Care  Patient       Patient will benefit from skilled therapeutic intervention in order to improve the following deficits and impairments:  Pain, Impaired sensation, Increased fascial restricitons, Decreased scar mobility, Postural dysfunction, Decreased range of motion, Decreased strength, Impaired UE functional use, Increased edema  Visit Diagnosis: Aftercare following surgery for neoplasm  Stiffness of right shoulder, not elsewhere classified  Stiffness of left shoulder, not elsewhere classified  Localized swelling, mass and lump, trunk     Problem List Patient Active Problem List   Diagnosis Date Noted  . Breast cancer, right ([place]) 03/15/2018  . Genetic testing 02/20/2018  . Family history of breast cancer   . Family history of lung cancer   . Personal history of lung cancer   . Chest pain 11/28/2017  . Abnormal CT of the chest 11/30/2016  . Malignant neoplasm of upper-inner quadrant of left breast in female, estrogen receptor positive ([place]) 06/16/2014  . Osteopenia 09/13/2013  . Postmenopausal atrophic vaginitis 09/13/2013  . Unspecified vitamin D deficiency 09/13/2013  . Malignant neoplasm of lower lobe of right lung ([place]) 07/24/2012  . History of lung cancer 03/11/2011  . Anxiety 03/11/2011  . History of neutropenia 03/11/2011  . Family history of colon cancer 03/11/2011  . Cough 11/15/2010  . Bronchiectasis without acute exacerbation ([place]) 11/15/2010    Annia Friendly, PT 04/12/18 12:01 PM   [place], [place], [address] Phone: [phone]   Fax:  [phone]  Name: Anna Valentine MRN: [id number] Date of Birth: 04/29/1951

## 2018-04-12 NOTE — Patient Instructions (Signed)
Closed Chain: Shoulder Abduction / Adduction - on Wall    One hand on wall, step to side and return. Stepping causes shoulder to abduct and adduct. Step _5__ times, each side, holding 10 seconds, _2__ times per day.  http://ss.exer.us/267   Copyright  VHI. All rights reserved.  Closed Chain: Shoulder Flexion / Extension - on Wall    Hands on wall, step backward. Return. Stepping causes shoulder flexion and extension Do _3__ times, each foot, holding 10 seconds_3__ times per day.  http://ss.exer.us/265   Copyright  VHI. All rights reserved.

## 2018-04-15 ENCOUNTER — Encounter (HOSPITAL_COMMUNITY): Payer: Self-pay | Admitting: Emergency Medicine

## 2018-04-15 ENCOUNTER — Other Ambulatory Visit: Payer: Self-pay

## 2018-04-15 ENCOUNTER — Ambulatory Visit (INDEPENDENT_AMBULATORY_CARE_PROVIDER_SITE_OTHER): Payer: Medicare Other

## 2018-04-15 ENCOUNTER — Ambulatory Visit (HOSPITAL_COMMUNITY)
Admission: EM | Admit: 2018-04-15 | Discharge: 2018-04-15 | Disposition: A | Discharge status: Home / Self Care | Payer: Medicare Other | Attending: Internal Medicine | Admitting: Internal Medicine

## 2018-04-15 DIAGNOSIS — S82892A Other fracture of left lower leg, initial encounter for closed fracture: Secondary | ICD-10-CM

## 2018-04-15 DIAGNOSIS — M7989 Other specified soft tissue disorders: Secondary | ICD-10-CM | POA: Diagnosis not present

## 2018-04-15 DIAGNOSIS — S99912A Unspecified injury of left ankle, initial encounter: Secondary | ICD-10-CM | POA: Diagnosis not present

## 2018-04-15 DIAGNOSIS — M25572 Pain in left ankle and joints of left foot: Secondary | ICD-10-CM | POA: Diagnosis not present

## 2018-04-15 NOTE — ED Triage Notes (Signed)
The patient presented to the [facility] with a complaint of left ankle pain and swelling secondary to falling off of the toilet yesterday.

## 2018-04-15 NOTE — ED Provider Notes (Signed)
[place]    CSN: [id number] Arrival date & time: 04/15/18  1043     History   Chief Complaint Chief Complaint  Patient presents with  . Ankle Pain    HPI Anna Valentine is a 67 y.o. female history of recent vasectomy and breast cancer, COPD, osteoporosis presenting today for evaluation of left ankle pain.  Patient was standing up from the toilet yesterday, she felt her leg gave out and believes that she rolled her ankle.  Felt a slight pop.  But has had pain and swelling since.  Pain with weightbearing and limping.  Denies numbness or tingling.  HPI  Past Medical History:  Diagnosis Date  . Anemia   . BRCA negative 2011   BRCA I/ II negative  . Breast cancer ([place]) 08/2009   stage 2, rx with lumpectomy and xrt  . COPD (chronic obstructive pulmonary disease) (HCC)    pt.unsure of diagnosis status  . Emphysema of lung ([place])    pt. questions diagnosis  . Family history of adverse reaction to anesthesia    My Sister has nausea  . Family history of breast cancer   . Family history of colon cancer   . Family history of lung cancer   . GERD (gastroesophageal reflux disease)    not presently having symptoms  . Lung cancer ([place]) 06/06/05   stage 1 poorly differentiated adenocarcinoma, s/p right lower lobectomy.  . Osteoporosis   . Personal history of lung cancer   . STD (sexually transmitted disease)    HSV    Patient Active Problem List   Diagnosis Date Noted  . Breast cancer, right ([place]) 03/15/2018  . Genetic testing 02/20/2018  . Family history of breast cancer   . Family history of lung cancer   . Personal history of lung cancer   . Chest pain 11/28/2017  . Abnormal CT of the chest 11/30/2016  . Malignant neoplasm of upper-inner quadrant of left breast in female, estrogen receptor positive ([place]) 06/16/2014  . Osteopenia 09/13/2013  . Postmenopausal atrophic vaginitis 09/13/2013  . Unspecified vitamin D deficiency 09/13/2013  . Malignant neoplasm of  lower lobe of right lung ([place]) 07/24/2012  . History of lung cancer 03/11/2011  . Anxiety 03/11/2011  . History of neutropenia 03/11/2011  . Family history of colon cancer 03/11/2011  . Cough 11/15/2010  . Bronchiectasis without acute exacerbation ([place]) 11/15/2010    Past Surgical History:  Procedure Laterality Date  . BREAST BIOPSY    . BREAST LUMPECTOMY  10/12/2009   Left lumpectomy and radiation, stage II, ER/PR+, Her 2 nu negative  . BUNIONECTOMY    . COLONOSCOPY    . LOBECTOMY  06/06/2005   RLL  . MASTECTOMY W/ SENTINEL NODE BIOPSY Bilateral 03/15/2018  . MASTECTOMY W/ SENTINEL NODE BIOPSY Bilateral 03/15/2018   Procedure: BILATERAL TOTAL MASTECTOMIES WITH RIGHT SENTINEL LYMPH NODE BIOPSY;  Surgeon: Rolm Bookbinder, MD;  Location: [facility];  Service: General;  Laterality: Bilateral;  . PORTACATH PLACEMENT N/A 03/15/2018   Procedure: INSERTION PORT-A-CATH WITH [place];  Surgeon: Rolm Bookbinder, MD;  Location: [facility];  Service: General;  Laterality: N/A;  . TONSILLECTOMY    . TUBAL LIGATION  1984    OB History    Gravida  2   Para  0   Term  0   Preterm  0   AB  2   Living        SAB      TAB  Ectopic      Multiple      Live Births               Home Medications    Prior to Admission medications   Medication Sig Start Date End Date Taking? Authorizing Provider  acyclovir (ZOVIRAX) 400 MG tablet TAKE 1 TABLET BY MOUTH TWICE A DAY 04/11/18   [name], MD  calcium gluconate 500 MG tablet Take 500 mg by mouth daily.    [provider]  Carboxymeth-Glycerin-Polysorb (REFRESH OPTIVE ADVANCED OP) Place 1 drop into both eyes daily.    [provider]  cholecalciferol (VITAMIN D) 1000 UNITS tablet Take 1,000 Units by mouth daily.    [provider]  oxyCODONE (OXY IR/ROXICODONE) 5 MG immediate release tablet Take 1 tablet (5 mg total) by mouth every 4 (four) hours as needed for moderate pain. 03/16/18   Rolm Bookbinder, MD    Psyllium (METAMUCIL PO) Take 5 mLs by mouth daily.     [provider]  vitamin C (ASCORBIC ACID) 500 MG tablet Take 500 mg by mouth daily.    [provider]    Family History Family History  Problem Relation Age of Onset  . Allergies Mother   . Asthma Mother   . Lung cancer Mother   . Breast cancer Mother 20       recurrence age 57  . Colon cancer Father 47  . Prostate cancer Brother 23  . Breast cancer Sister 30       Recurrence age 51 BRCA negative  . Colon polyps Sister   . Breast cancer Sister 29  . Prostate cancer Brother 46  . Breast cancer Maternal Grandmother 15  . Colon cancer Maternal Aunt   . Leukemia Maternal Grandfather   . Lung cancer Maternal Aunt   . Breast cancer Cousin   . Esophageal cancer Neg Hx   . Rectal cancer Neg Hx   . Stomach cancer Neg Hx     Social History Social History   Tobacco Use  . Smoking status: Former Smoker    Packs/day: 2.00    Years: 18.00    Pack years: 36.00    Last attempt to quit: 07/12/1987    Years since quitting: 30.7  . Smokeless tobacco: Never Used  Substance Use Topics  . Alcohol use: Yes    Alcohol/week: 4.0 standard drinks    Types: 4 Standard drinks or equivalent per week    Comment: social  . Drug use: No     Allergies   Clarithromycin   Review of Systems Review of Systems  Constitutional: Negative for fatigue and fever.  Eyes: Negative for visual disturbance.  Respiratory: Negative for shortness of breath.   Cardiovascular: Negative for chest pain.  Gastrointestinal: Negative for abdominal pain, nausea and vomiting.  Musculoskeletal: Positive for arthralgias, gait problem, joint swelling and myalgias.  Skin: Positive for color change. Negative for rash and wound.  Neurological: Negative for dizziness, weakness, light-headedness and headaches.     Physical Exam Triage Vital Signs ED Triage Vitals [04/15/18 1122]  Enc Vitals Group     BP (!) 147/88     Pulse Rate 78      Resp 16     Temp 97.9 F (36.6 C)     Temp Source Oral     SpO2 100 %     Weight      Height      Head Circumference  Peak Flow      Pain Score 4     Pain Loc      Pain Edu?      Excl. in [place]?    No data found.  Updated Vital Signs BP (!) 147/88 (BP Location: Left Arm)   Pulse 78   Temp 97.9 F (36.6 C) (Oral)   Resp 16   SpO2 100%   Visual Acuity Right Eye Distance:   Left Eye Distance:   Bilateral Distance:    Right Eye Near:   Left Eye Near:    Bilateral Near:     Physical Exam  Constitutional: She is oriented to person, place, and time. She appears well-developed and well-nourished.  No acute distress  HENT:  Head: Normocephalic and atraumatic.  Nose: Nose normal.  Eyes: Conjunctivae are normal.  Neck: Neck supple.  Cardiovascular: Normal rate.  Pulmonary/Chest: Effort normal. No respiratory distress.  Abdominal: She exhibits no distension.  Musculoskeletal: Normal range of motion. She exhibits edema and tenderness.  Moderate swelling and bruising to lateral malleolus of left ankle, tenderness to palpation overlying this area, extending to anterior tarsal, does not extend over metatarsals Dorsalis pedis 1+, cap refill less than 2 seconds, toes do feel cold.  Neurological: She is alert and oriented to person, place, and time.  Skin: Skin is warm and dry.  Psychiatric: She has a normal mood and affect.  Nursing note and vitals reviewed.    UC Treatments / Results  Labs (all labs ordered are listed, but only abnormal results are displayed) Labs Reviewed - No data to display  EKG None  Radiology Dg Ankle Complete Left  Result Date: 04/15/2018 CLINICAL DATA:  Felipa Evener a pop in LEFT ankle when stood up from using the restroom yesterday, fall, lateral LEFT ankle pain EXAM: LEFT ANKLE COMPLETE - 3+ VIEW COMPARISON:  None FINDINGS: Lateral soft tissue swelling. Mild osseous demineralization. Ankle joint space preserved. Rounded ossicle identified adjacent  to tip of lateral malleolus, appears corticated and old. An additional linear calcific density is seen at the lateral joint line which could represent a tiny avulsion fragment. No additional fracture, dislocation, or bone destruction. IMPRESSION: Questionable tiny avulsion fragment at lateral joint line. Lateral soft tissue swelling. Electronically Signed   By: Lavonia Dana M.D.   On: 04/15/2018 11:40    Procedures Procedures (including critical care time)  Medications Ordered in UC Medications - No data to display  Initial Impression / Assessment and Plan / UC Course  I have reviewed the triage vital signs and the nursing notes.  Pertinent labs & imaging results that were available during my care of the patient were reviewed by me and considered in my medical decision making (see chart for details).     Questionable avulsion fracture on x-ray, given swelling will treat as such with cam walker, deferring crutches as patient recently had double mastectomy.  Follow-up with orthopedics.  Anti-inflammatories for pain and swelling, ice and elevation.  Recommending to stay off foot as much as possible.Discussed strict return precautions. Patient verbalized understanding and is agreeable with plan.  Final Clinical Impressions(s) / UC Diagnoses   Final diagnoses:  Closed avulsion fracture of left ankle, initial encounter     Discharge Instructions     Small fracture off the end of your ankle, please follow-up with [facility] orthopedics  Please wear a boot to help immobilize her ankle over the next 3 to 4 weeks Use anti-inflammatories for pain/swelling. You may take up to 800 mg  Ibuprofen every 8 hours with food. You may supplement Ibuprofen with Tylenol [phone] mg every 8 hours.  You may remove your foot from the boot and ice and elevate it when at home   ED Prescriptions    None     Controlled Substance Prescriptions [place] Controlled Substance Registry consulted? Not Applicable    Janith Lima, [place] 04/15/18 1219

## 2018-04-15 NOTE — Discharge Instructions (Signed)
Small fracture off the end of your ankle, please follow-up with [facility] orthopedics  Please wear a boot to help immobilize her ankle over the next 3 to 4 weeks Use anti-inflammatories for pain/swelling. You may take up to 800 mg Ibuprofen every 8 hours with food. You may supplement Ibuprofen with Tylenol [phone] mg every 8 hours.  You may remove your foot from the boot and ice and elevate it when at home

## 2018-04-16 ENCOUNTER — Encounter: Payer: Self-pay | Admitting: Physical Therapy

## 2018-04-16 ENCOUNTER — Ambulatory Visit: Payer: Medicare Other | Admitting: Physical Therapy

## 2018-04-16 DIAGNOSIS — Z483 Aftercare following surgery for neoplasm: Secondary | ICD-10-CM

## 2018-04-16 DIAGNOSIS — M25611 Stiffness of right shoulder, not elsewhere classified: Secondary | ICD-10-CM | POA: Diagnosis not present

## 2018-04-16 DIAGNOSIS — M25612 Stiffness of left shoulder, not elsewhere classified: Secondary | ICD-10-CM | POA: Diagnosis not present

## 2018-04-16 DIAGNOSIS — R222 Localized swelling, mass and lump, trunk: Secondary | ICD-10-CM | POA: Diagnosis not present

## 2018-04-16 NOTE — Therapy (Signed)
[place], [place], [address] Phone: [phone]   Fax:  [phone]  Physical Therapy Treatment  Patient Details  Name: Anna Valentine MRN: [id number] Date of Birth: [birth year] Referring Provider (PT): Dr. Donne Hazel    Encounter Date: 04/16/2018  PT End of Session - 04/16/18 1119    Visit Number  4    Number of Visits  9    Date for PT Re-Evaluation  05/04/18    PT Start Time  0932    PT Stop Time  1015    PT Time Calculation (min)  43 min    Activity Tolerance  Patient tolerated treatment well    Behavior During Therapy  [facility] for tasks assessed/performed       Past Medical History:  Diagnosis Date  . Anemia   . BRCA negative 2011   BRCA I/ II negative  . Breast cancer ([place]) 08/2009   stage 2, rx with lumpectomy and xrt  . COPD (chronic obstructive pulmonary disease) (HCC)    pt.unsure of diagnosis status  . Emphysema of lung ([place])    pt. questions diagnosis  . Family history of adverse reaction to anesthesia    My Sister has nausea  . Family history of breast cancer   . Family history of colon cancer   . Family history of lung cancer   . GERD (gastroesophageal reflux disease)    not presently having symptoms  . Lung cancer ([place]) 06/06/05   stage 1 poorly differentiated adenocarcinoma, s/p right lower lobectomy.  . Osteoporosis   . Personal history of lung cancer   . STD (sexually transmitted disease)    HSV    Past Surgical History:  Procedure Laterality Date  . BREAST BIOPSY    . BREAST LUMPECTOMY  10/12/2009   Left lumpectomy and radiation, stage II, ER/PR+, Her 2 nu negative  . BUNIONECTOMY    . COLONOSCOPY    . LOBECTOMY  06/06/2005   RLL  . MASTECTOMY W/ SENTINEL NODE BIOPSY Bilateral 03/15/2018  . MASTECTOMY W/ SENTINEL NODE BIOPSY Bilateral 03/15/2018   Procedure: BILATERAL TOTAL MASTECTOMIES WITH RIGHT SENTINEL LYMPH NODE BIOPSY;  Surgeon: Rolm Bookbinder, MD;   Location: [facility];  Service: General;  Laterality: Bilateral;  . PORTACATH PLACEMENT N/A 03/15/2018   Procedure: INSERTION PORT-A-CATH WITH [place];  Surgeon: Rolm Bookbinder, MD;  Location: [facility];  Service: General;  Laterality: N/A;  . TONSILLECTOMY    . TUBAL LIGATION  1984    There were no vitals filed for this visit.  Subjective Assessment - 04/16/18 0933    Subjective  I now have a closed avulsion fracture on my left ankle because my leg gave way when I stood up from the toilet.     Pertinent History   New right breast cancer diagnosed on 01/25/2018.  She underwent a bilateral mastectomy wit 6 lymph nodes removed on the right on 03/15/2018. 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  She also had a port placed on the right side at that time.   Pt had previous left breast cancer 2008 with sentinel node removal and radiation, lung cancer in 2006 She has history of frozen shoulder on the right that has resolved and the start of frozen shoulder on the left that resolved with radiation.  She has LFS .    Patient Stated Goals  to keep a good posture and get rid of the pain  Currently in Pain?  No/denies    Pain Score  0-No pain                       [facility] Adult PT Treatment/Exercise - 04/16/18 0001      Exercises   Exercises  Shoulder      Shoulder Exercises: Supine   Other Supine Exercises  Educated pt to interlace hands behind head and push elbows back towards mat until a stretch is felt      Shoulder Exercises: Seated   Other Seated Exercises  Seated shoulder flexion and abduction, demonstrated seated bilateral shoulder flexion and abduction so pt can do these since she recently broke her ankle and had pt demonstrate      Shoulder Exercises: Pulleys   Flexion  2 minutes    ABduction  2 minutes      Manual Therapy   Manual Therapy  Myofascial release;Passive ROM;Neural Stretch    Myofascial Release  To chest and incision supine to  tolerance bilaterally, left scar more tight than right and more tender    Passive ROM  In Supine to bil shoulders into flexion, abduction and D2 to pts tolerance    Neural Stretch  To right UE in supine                  PT Long Term Goals - 04/04/18 1643      PT LONG TERM GOAL #1   Title  Pt will report pain is reduced by 50%    Time  4    Period  Weeks    Status  New      PT LONG TERM GOAL #2   Title  Pt will have 150 degrees of bilateral shoulder abduction so that she can return to all household activities at home     Time  4    Period  Weeks    Status  New      PT LONG TERM GOAL #3   Title  Pt will reduce quick DASH score to < 10 indicating a functional improvment of UEs    Baseline  27.27 on 04/04/2018    Time  4    Period  Weeks    Status  New      PT LONG TERM GOAL #4   Title  Pt will vebalize understanding of lymphedema risk reduction practices     Time  4    Period  Weeks    Status  New            Plan - 04/16/18 1120    Clinical Impression Statement  Pt fell and broke her left ankle so educated pt today in modified shoulder stretches while seated in chair so she does not have to stand. Pt was able to demonstrate these correctly. Continued with manual therapy to increase ROM in bilateral shoulders. She still has increased tightness across bilateral pecs limiting ROM.     Rehab Potential  Excellent    Clinical Impairments Affecting Rehab Potential  recent surgery    PT Frequency  2x / week    PT Duration  4 weeks    PT Treatment/Interventions  ADLs/Self Care Home Management;Manual techniques;Orthotic Fit/Training;Patient/family education;Manual lymph drainage;Compression bandaging;Scar mobilization;Passive range of motion    PT Next Visit Plan  continue PROM to bilateral shoulders, add ball up wall, continue scar massage/myofacial release to chest    PT Home Exercise Plan  seated bilateral shoulder flexion and abduction on  wall    Consulted and Agree  with Plan of Care  Patient       Patient will benefit from skilled therapeutic intervention in order to improve the following deficits and impairments:  Pain, Impaired sensation, Increased fascial restricitons, Decreased scar mobility, Postural dysfunction, Decreased range of motion, Decreased strength, Impaired UE functional use, Increased edema  Visit Diagnosis: Stiffness of right shoulder, not elsewhere classified  Stiffness of left shoulder, not elsewhere classified  Aftercare following surgery for neoplasm     Problem List Patient Active Problem List   Diagnosis Date Noted  . Breast cancer, right ([place]) 03/15/2018  . Genetic testing 02/20/2018  . Family history of breast cancer   . Family history of lung cancer   . Personal history of lung cancer   . Chest pain 11/28/2017  . Abnormal CT of the chest 11/30/2016  . Malignant neoplasm of upper-inner quadrant of left breast in female, estrogen receptor positive ([place]) 06/16/2014  . Osteopenia 09/13/2013  . Postmenopausal atrophic vaginitis 09/13/2013  . Unspecified vitamin D deficiency 09/13/2013  . Malignant neoplasm of lower lobe of right lung ([place]) 07/24/2012  . History of lung cancer 03/11/2011  . Anxiety 03/11/2011  . History of neutropenia 03/11/2011  . Family history of colon cancer 03/11/2011  . Cough 11/15/2010  . Bronchiectasis without acute exacerbation ([place]) 11/15/2010    Allyson Sabal [facility] 04/16/2018, [place], [place], [address] Phone: [phone]   Fax:  [phone]  Name: Anna Valentine MRN: [id number] Date of Birth: 24-May-1951  Manus Gunning, PT 04/16/18 11:22 AM

## 2018-04-17 DIAGNOSIS — H5319 Other subjective visual disturbances: Secondary | ICD-10-CM | POA: Diagnosis not present

## 2018-04-17 DIAGNOSIS — H43393 Other vitreous opacities, bilateral: Secondary | ICD-10-CM | POA: Diagnosis not present

## 2018-04-17 DIAGNOSIS — H43813 Vitreous degeneration, bilateral: Secondary | ICD-10-CM | POA: Diagnosis not present

## 2018-04-18 ENCOUNTER — Ambulatory Visit: Payer: Medicare Other

## 2018-04-18 DIAGNOSIS — Z483 Aftercare following surgery for neoplasm: Secondary | ICD-10-CM

## 2018-04-18 DIAGNOSIS — M25611 Stiffness of right shoulder, not elsewhere classified: Secondary | ICD-10-CM

## 2018-04-18 DIAGNOSIS — R222 Localized swelling, mass and lump, trunk: Secondary | ICD-10-CM | POA: Diagnosis not present

## 2018-04-18 DIAGNOSIS — M25612 Stiffness of left shoulder, not elsewhere classified: Secondary | ICD-10-CM

## 2018-04-18 NOTE — Therapy (Signed)
[place], [place], [address] Phone: [phone]   Fax:  [phone]  Physical Therapy Treatment  Patient Details  Name: Anna Valentine MRN: [id number] Date of Birth: September 24, 1950 Referring Provider (PT): Dr. Donne Hazel    Encounter Date: 04/18/2018  PT End of Session - 04/18/18 0932    Visit Number  5    Number of Visits  9    Date for PT Re-Evaluation  05/04/18    PT Start Time  0850    PT Stop Time  0933    PT Time Calculation (min)  43 min    Activity Tolerance  Patient tolerated treatment well    Behavior During Therapy  [facility] for tasks assessed/performed       Past Medical History:  Diagnosis Date  . Anemia   . BRCA negative 2011   BRCA I/ II negative  . Breast cancer ([place]) 08/2009   stage 2, rx with lumpectomy and xrt  . COPD (chronic obstructive pulmonary disease) (HCC)    pt.unsure of diagnosis status  . Emphysema of lung ([place])    pt. questions diagnosis  . Family history of adverse reaction to anesthesia    My Sister has nausea  . Family history of breast cancer   . Family history of colon cancer   . Family history of lung cancer   . GERD (gastroesophageal reflux disease)    not presently having symptoms  . Lung cancer ([place]) 06/06/05   stage 1 poorly differentiated adenocarcinoma, s/p right lower lobectomy.  . Osteoporosis   . Personal history of lung cancer   . STD (sexually transmitted disease)    HSV    Past Surgical History:  Procedure Laterality Date  . BREAST BIOPSY    . BREAST LUMPECTOMY  10/12/2009   Left lumpectomy and radiation, stage II, ER/PR+, Her 2 nu negative  . BUNIONECTOMY    . COLONOSCOPY    . LOBECTOMY  06/06/2005   RLL  . MASTECTOMY W/ SENTINEL NODE BIOPSY Bilateral 03/15/2018  . MASTECTOMY W/ SENTINEL NODE BIOPSY Bilateral 03/15/2018   Procedure: BILATERAL TOTAL MASTECTOMIES WITH RIGHT SENTINEL LYMPH NODE BIOPSY;  Surgeon: Rolm Bookbinder, MD;   Location: [place];  Service: General;  Laterality: Bilateral;  . PORTACATH PLACEMENT N/A 03/15/2018   Procedure: INSERTION PORT-A-CATH WITH [place];  Surgeon: Rolm Bookbinder, MD;  Location: [facility];  Service: General;  Laterality: N/A;  . TONSILLECTOMY    . TUBAL LIGATION  1984    There were no vitals filed for this visit.  Subjective Assessment - 04/18/18 0853    Subjective  The ABC class was good the other day. I don't have any questions about lymphedema except I am interested in the knitted knockers bc I got my prosthetics and new bra and they are heavy and my chest still sensitive.    Pertinent History   New right breast cancer diagnosed on 01/25/2018.  She underwent a bilateral mastectomy wit 6 lymph nodes removed on the right on 03/15/2018. 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  She also had a port placed on the right side at that time.   Pt had previous left breast cancer 2008 with sentinel node removal and radiation, lung cancer in 2006 She has history of frozen shoulder on the right that has resolved and the start of frozen shoulder on the left that resolved with radiation.  She has LFS .  Patient Stated Goals  to keep a good posture and get rid of the pain     Currently in Pain?  No/denies                       [facility] Adult PT Treatment/Exercise - 04/18/18 0001      Manual Therapy   Myofascial Release  To chest and incision supine to tolerance bilaterally, left scar more tight than right and more tender    Passive ROM  In Supine to bil shoulders into flexion, abduction and D2 to pts tolerance    Neural Stretch  To right UE in supine                  PT Long Term Goals - 04/04/18 1643      PT LONG TERM GOAL #1   Title  Pt will report pain is reduced by 50%    Time  4    Period  Weeks    Status  New      PT LONG TERM GOAL #2   Title  Pt will have 150 degrees of bilateral shoulder abduction so that she can return  to all household activities at home     Time  4    Period  Weeks    Status  New      PT LONG TERM GOAL #3   Title  Pt will reduce quick DASH score to < 10 indicating a functional improvment of UEs    Baseline  27.27 on 04/04/2018    Time  4    Period  Weeks    Status  New      PT LONG TERM GOAL #4   Title  Pt will vebalize understanding of lymphedema risk reduction practices     Time  4    Period  Weeks    Status  New            Plan - 04/18/18 6269    Clinical Impression Statement  Continued with focus to manual therapy which pt tolerated well. Her end P/ROM improves well towards end of session. She sees orthopedist next week and is hopeful to not have to wear boot anymore.     Rehab Potential  Excellent    Clinical Impairments Affecting Rehab Potential  recent surgery    PT Frequency  2x / week    PT Duration  4 weeks    PT Treatment/Interventions  ADLs/Self Care Home Management;Manual techniques;Orthotic Fit/Training;Patient/family education;Manual lymph drainage;Compression bandaging;Scar mobilization;Passive range of motion    PT Next Visit Plan  Reassess ROM; continue PROM to bilateral shoulders, add ball up wall, continue scar massage/myofacial release to chest    Consulted and Agree with Plan of Care  Patient       Patient will benefit from skilled therapeutic intervention in order to improve the following deficits and impairments:  Pain, Impaired sensation, Increased fascial restricitons, Decreased scar mobility, Postural dysfunction, Decreased range of motion, Decreased strength, Impaired UE functional use, Increased edema  Visit Diagnosis: Stiffness of right shoulder, not elsewhere classified  Stiffness of left shoulder, not elsewhere classified  Aftercare following surgery for neoplasm  Localized swelling, mass and lump, trunk     Problem List Patient Active Problem List   Diagnosis Date Noted  . Breast cancer, right ([place]) 03/15/2018  . Genetic  testing 02/20/2018  . Family history of breast cancer   . Family history of lung cancer   . Personal  history of lung cancer   . Chest pain 11/28/2017  . Abnormal CT of the chest 11/30/2016  . 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 Osteopenia 09/13/2013  . Postmenopausal atrophic vaginitis 09/13/2013  . Unspecified vitamin D deficiency 09/13/2013  . Malignant neoplasm of lower lobe of right lung ([place]) 07/24/2012  . History of lung cancer 03/11/2011  . Anxiety 03/11/2011  . History of neutropenia 03/11/2011  . Family history of colon cancer 03/11/2011  . Cough 11/15/2010  . Bronchiectasis without acute exacerbation ([place]) 11/15/2010    Otelia Limes, PTA 04/18/2018, 9:35 AM  [place], [place], [address] Phone: [phone]   Fax:  [phone]  Name: JURY CASERTA MRN: [id number] Date of Birth: 05-28-1951

## 2018-04-18 NOTE — Progress Notes (Signed)
[place]  [phone] Fax:([phone]) [phone]     ID: Anna [facility] DOB: 08/22/1950  MR#: [id number]  IEP#:[id number]  Patient Care Team: Anna Showers, MD as PCP - General (Internal Medicine) Valentine, Anna Dad, MD as Consulting Physician (Oncology) Anna Gobble, MD as Consulting Physician (Pulmonary Disease) Anna Paterson, MD as Referring Physician Valentine, Anna Raspberry, MD as Consulting Physician (Gastroenterology) Kem [facility], Anna (Family Medicine) Anna Pray, MD as Consulting Physician (Radiation Oncology) Anna Bookbinder, MD as Consulting Physician (General Surgery)  CHIEF COMPLAINT:  (1) Estrogen receptor positive left-sided breast cancer    (2) history of stage IA right lung adenocarcinoma resected November 2006    (3) triple- positive right-sided breast cancer   CURRENT TREATMENT: Adjuvant chemo/immunotherapy  INTERVAL HISTORY: Anna [facility] returns today for follow-up of her new estrogen receptor positive breast cancer.  Since her last visit, she underwent bilateral mastectomies with sentinel lymph node sampling on 03/15/2018 with pathology showing ([id number]): On the left, no malignancy identified. On the right, invasive ductal carcinoma grade II spanning 1.5 cm. Ductal carcinoma in situ, high grade. Margins are negative. 6 right axillary sentinel lymph nodes are negative for carcinoma.   Her case was represented in the multidisciplinary breast cancer clinic on 03/28/2018.  Since she had no lymph node involvement and had mastectomy she would not need adjuvant radiation.  Adjuvant chemotherapy followed by aromatase inhibitor was recommended.  She was seen in the emergency room on 04/15/2018 with a possible left ankle avulsion fracture. She was on her commode, and she notes that she got up and her leg gave out. She is following up with Dr. Stann [facility] at [facility].     REVIEW OF SYSTEMS: Anna [facility] reports that she had burning in  both breast, and she was given gabapentin and pain medicine, with no relief. She also took xanax for about 4-5 days, which she felt worked better. She is doing physical therapy, and this can bring back the burning feeling to some extent.. She rates her pain as a 3/10 during physical therapy. She is glad that she is making progress. She notes that today is a good day and she is not having much pain. She notes that her husband is tired from his treatments. She denies unusual headaches, visual changes, nausea, vomiting, or dizziness. There has been no unusual cough, phlegm production, or pleurisy. There has been no change in bowel or bladder habits. She denies unexplained fatigue or unexplained weight loss, bleeding, rash, or fever. A detailed review of systems was otherwise stable.    LUNG CANCER HISTORY:  From Dr. Dana Valentine original intake note 07/26/2005: (Lung cancer presentation)  "The patient is a very pleasant 67 year old female without a significant past medical history, who states that in 04/2005 she felt a lump in the left neck.  It did not resolve spontaneously, so she was seen by Dr. Tommie Valentine [facility], and a CT scan of the neck was performed on 04/25/05.  There was noted to be two lymph nodes on the left, and they were normal in size, but they were asymmetrical.  No other pathologic findings were noted, except for left internal jugular vein, which was atypically positioned.  However, because of these enlarged lymph nodes, a CT scan of the chest was also obtained by Dr. Renold Valentine on 04/27/05, and the CT scan did reveal a poorly defined nodular opacity medially in the right upper lobe, measuring 6.5 mg anterior to posterior.  Within the right lower lobe there was  noted to be a lobular nodular mass measuring 13 x 13 mm, and was felt to be worrisome for a primary lung carcinoma.  No other nodules or effusions were seen on the left lung.  On the mediastinal window images there was slight prominence of  right hilar nodes, but no other evidence of mediastinal or hilar adenopathy.  A PET scan was thereafter obtained on 05/06/05.  The PET scan did reveal increased FDG activity within a small mass in the posterior superior right lower lobe, highly suspicious for a malignancy.  No significant nodal FDG uptake was identified within the hilar regions or the mediastinum.  Also noted was abnormal FDG uptake in the right middle lobe, corresponding to the CT scan findings, and again suspicious for a malignancy.  There was no evidence of metastatic disease within the neck, abdomen or pelvis.  The patient was then referred to [facility], and was seen by [facility].  The patient underwent a right lower lobe wedge resection with completion lobectomy and mediastinal lymph node biopsies.  The pathology revealed the following:  Right upper lobe wedge resection revealed pulmonary tissue with necrotizing granulomatous inflammation.  No evidence of malignancy.  Right middle lobe wedge revealed again pulmonary tissue with necrotizing granulomatous inflammation.  No evidence of malignancy.  The right pleural lobe (lobectomy) revealed an adenocarcinoma, poorly differentiated, grade 3, measuring 1.2 cm.  The visceral pleura was negative.  Chest wall negative.  Mediastinum negative.  All margins were negative, including the pleural and parenchymal margins.  There was no evidence of lymphovascular invasion.  In all lymph nodes sampled, including level 11, level 7, level 12, 4R and 2R lymph nodes negative for malignancy.  The patient was staged as T1 N0 M0, stage IA adenocarcinoma of the right lung.  Postoperatively the patient's course was complicated by the development of pleural effusion, requiring diuresis.  She had multiple x-rays performed.  Last x-ray performed on 07/07/05 revealed persistent right pleural effusion.  It was recommended that the patient have followups at [facility].  Dr. Tommie Valentine Baxley kindly refers the patient to me  today for medical oncology evaluation.  Clinically, the patient states that she has recovered well.  She is once again walking.  She was a runner, and she is trying to get back in shape."  LEFT BREAST CANCER HISTORY:From [name]'s 09/30/2009 note:  "She tells me that most recently she had her yearly screening mammogram performed that revealed an abnormality.  She also on exam was noted to have a palpable lump in the upper left breast as well.  Because of the abnormality, patient on September 02, 2009 had a digital diagnostic mammogram of the left breast and ultrasound of the left breast.  The mammogram revealed a 7.0 mm area slightly increased density in the upper left breast.  There were no suspicious calcifications noted.  The breast was heterogeneously dense.  Patient then went onto have an ultrasound of the breast performed and again a 7.0 mm irregular hypoechoic shadowing mass was noted in the 12 o'clock position of the left breast 5.0 cm from the nipple.  There were no enlarged or abnormal left axillary lymph nodes identified.  Patient went onto have a core biopsy performed on 09/02/2009 [phone]).  The needle core biopsy of the 12 o'clock mass revealed an invasive mammary carcinoma, low to intermediate grade.  It was lobular carcinoma.  Confirmatory immunohistochemical stains revealed the tumor to be strongly positive for cytokeratin AE1-AE3 with an infiltrative pattern  of growth and the tumor was negative for E-cadherin stain confirming a lobular phenotype.  The tumor was estrogen receptor positive at 98%, progesterone receptor positive 6%, proliferation marker Ki-67 by MIB was 14%.  The tumor did not express HER-2/neu by CISH with a signal of 1.04.  Patient was seen by Dr. Neldon Mc on 09/08/2009 for discussion of surgical options.  His recommendation was a lumpectomy.  Patient also had bilateral MRI of the breasts performed, which revealed a 0.8 cm mildly enlarged area of mass-like  enhancement at the 12 o'clock location corresponding to the biopsy-proven breast cancer.  There was no evidence of lymphadenopathy."   PAST MEDICAL HISTORY: Past Medical History:  Diagnosis Date  . Anemia   . BRCA negative 2011   BRCA I/ II negative  . Breast cancer ([facility]) 08/2009   stage 2, rx with lumpectomy and xrt  . COPD (chronic obstructive pulmonary disease) ([facility])    pt.unsure of diagnosis status  . Emphysema of lung ([facility])    pt. questions diagnosis  . Family history of adverse reaction to anesthesia    My Sister has nausea  . Family history of breast cancer   . Family history of colon cancer   . Family history of lung cancer   . GERD (gastroesophageal reflux disease)    not presently having symptoms  . Lung cancer ([facility]) 06/06/05   stage 1 poorly differentiated adenocarcinoma, s/p right lower lobectomy.  . Osteoporosis   . Personal history of lung cancer   . STD (sexually transmitted disease)    HSV    PAST SURGICAL HISTORY: Past Surgical History:  Procedure Laterality Date  . BREAST BIOPSY    . BREAST LUMPECTOMY  10/12/2009   Left lumpectomy and radiation, stage II, ER/PR+, Her 2 nu negative  . BUNIONECTOMY    . COLONOSCOPY    . LOBECTOMY  06/06/2005   RLL  . MASTECTOMY W/ SENTINEL NODE BIOPSY Bilateral 03/15/2018  . MASTECTOMY W/ SENTINEL NODE BIOPSY Bilateral 03/15/2018   Procedure: BILATERAL TOTAL MASTECTOMIES WITH RIGHT SENTINEL LYMPH NODE BIOPSY;  Surgeon: Anna Bookbinder, MD;  Location: [facility];  Service: General;  Laterality: Bilateral;  . PORTACATH PLACEMENT N/A 03/15/2018   Procedure: INSERTION PORT-A-CATH WITH [place];  Surgeon: Anna Bookbinder, MD;  Location: [facility];  Service: General;  Laterality: N/A;  . TONSILLECTOMY    . TUBAL LIGATION  1984    FAMILY HISTORY Family History  Problem Relation Age of Onset  . Allergies Mother   . Asthma Mother   . Lung cancer Mother   . Breast cancer Mother 98       recurrence age 66  . Colon cancer Father 84  .  Prostate cancer Brother 58  . Breast cancer Sister 71       Recurrence age 27 BRCA negative  . Colon polyps Sister   . Breast cancer Sister 41  . Prostate cancer Brother 49  . Breast cancer Maternal Grandmother 48  . Colon cancer Maternal Aunt   . Leukemia Maternal Grandfather   . Lung cancer Maternal Aunt   . Breast cancer Cousin   . Esophageal cancer Neg Hx   . Rectal cancer Neg Hx   . Stomach cancer Neg Hx   The patient's maternal grandmother was diagnosed with cancer at age 106. Patient's mother was diagnosed at age 36. The patient's sister was diagnosed at age 56 with recurrence at 44 and is BRCA negative. A second sister was diagnosed at age 57.  GYNECOLOGIC  HISTORY:  No LMP recorded. Patient is postmenopausal. Menarche age 11, the patient is GX P0. She went through the change of life in approximately age 80. She did not take hormone replacement.  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 It's just the 2 of them at home. She takes care of 62-year-old for a neighbor and is the primary caregiver for her husband who has had a couple of small strokes and has myelodysplasia. She helps a friend out with her business. She walks her 37 year old lab. 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 Smoking status: Former Smoker    Packs/day: 2.00    Years: 18.00    Pack years: 36.00    Last attempt to quit: 07/12/1987    Years since quitting: 30.7  . Smokeless tobacco: Never Used  Substance Use Topics  . Alcohol use: Yes    Alcohol/week: 4.0 standard drinks    Types: 4 Standard drinks or equivalent per week    Comment: social  . Drug use: No    Allergies  Allergen Reactions  . Clarithromycin Rash    Current Outpatient Medications  Medication Sig Dispense Refill  . acyclovir (ZOVIRAX)  400 MG tablet TAKE 1 TABLET BY MOUTH TWICE A DAY 90 tablet 3  . calcium gluconate 500 MG tablet Take 500 mg by mouth daily.    . Carboxymeth-Glycerin-Polysorb (REFRESH OPTIVE ADVANCED OP) Place 1 drop into both eyes daily.    . cholecalciferol (VITAMIN D) 1000 UNITS tablet Take 1,000 Units by mouth daily.    Marland Kitchen oxyCODONE (OXY IR/ROXICODONE) 5 MG immediate release tablet Take 1 tablet (5 mg total) by mouth every 4 (four) hours as needed for moderate pain. 10 tablet 0  . Psyllium (METAMUCIL PO) Take 5 mLs by mouth daily.     . vitamin C (ASCORBIC ACID) 500 MG tablet Take 500 mg by mouth daily.     No current facility-administered medications for this visit.     OBJECTIVE: Middle-aged white woman who appears well  Vitals:   04/20/18 1200  BP: (!) 150/83  Pulse: 80  Resp: 18  Temp: 98 F (36.7 C)  SpO2: 100%     Body mass index is 19.14 kg/m.    ECOG FS:1 - Symptomatic but completely ambulatory   Sclerae unicteric, EOMs intact Oropharynx clear and moist No cervical or supraclavicular adenopathy Lungs no rales or rhonchi Heart regular rate and rhythm Abd soft, nontender, positive bowel sounds MSK no focal spinal tenderness, no upper extremity lymphedema Neuro: nonfocal, well oriented, appropriate affect Breasts: Status post bilateral mastectomies.  The incisions are healing nicely without dehiscence or erythema.  There is minimal swelling in the superior medial aspect of the left incision, possibly a small fluid collection.  Both axillae are benign.  LAB RESULTS:  CMP     Component Value Date/Time   NA 139 04/20/2018 1123   NA 139 12/22/2016 1141   K 4.3 04/20/2018 1123   K 4.2 12/22/2016 1141   CL 103 04/20/2018 1123   CL 103 11/22/2012 1025   CO2 27 04/20/2018 1123   CO2 28 12/22/2016 1141   GLUCOSE 95 04/20/2018 1123   GLUCOSE 92 12/22/2016 1141   GLUCOSE 94 11/22/2012 1025   BUN 16 04/20/2018 1123   BUN 13.2 12/22/2016 1141  CREATININE 0.73 04/20/2018 1123    CREATININE 0.64 11/07/2017 0906   CREATININE 0.7 12/22/2016 1141   CALCIUM 9.9 04/20/2018 1123   CALCIUM 9.7 12/22/2016 1141   PROT 7.8 04/20/2018 1123   PROT 7.0 12/22/2016 1141   ALBUMIN 3.8 04/20/2018 1123   ALBUMIN 3.8 12/22/2016 1141   AST 16 04/20/2018 1123   AST 18 12/22/2016 1141   ALT 9 04/20/2018 1123   ALT 10 12/22/2016 1141   ALKPHOS 99 04/20/2018 1123   ALKPHOS 81 12/22/2016 1141   BILITOT 0.6 04/20/2018 1123   BILITOT 0.65 12/22/2016 1141   GFRNONAA >60 04/20/2018 1123   GFRNONAA 93 11/07/2017 0906   GFRAA >60 04/20/2018 1123   GFRAA 108 11/07/2017 0906    INo results found for: SPEP, UPEP  Lab Results  Component Value Date   WBC 4.7 04/20/2018   NEUTROABS 3.0 04/20/2018   HGB 11.7 (L) 04/20/2018   HCT 36.8 04/20/2018   MCV 93.9 04/20/2018   PLT 242 04/20/2018      Chemistry      Component Value Date/Time   NA 139 04/20/2018 1123   NA 139 12/22/2016 1141   K 4.3 04/20/2018 1123   K 4.2 12/22/2016 1141   CL 103 04/20/2018 1123   CL 103 11/22/2012 1025   CO2 27 04/20/2018 1123   CO2 28 12/22/2016 1141   BUN 16 04/20/2018 1123   BUN 13.2 12/22/2016 1141   CREATININE 0.73 04/20/2018 1123   CREATININE 0.64 11/07/2017 0906   CREATININE 0.7 12/22/2016 1141      Component Value Date/Time   CALCIUM 9.9 04/20/2018 1123   CALCIUM 9.7 12/22/2016 1141   ALKPHOS 99 04/20/2018 1123   ALKPHOS 81 12/22/2016 1141   AST 16 04/20/2018 1123   AST 18 12/22/2016 1141   ALT 9 04/20/2018 1123   ALT 10 12/22/2016 1141   BILITOT 0.6 04/20/2018 1123   BILITOT 0.65 12/22/2016 1141       Lab Results  Component Value Date   LABCA2 18 09/30/2009    No components found for: [id number]  No results for input(s): INR in the last 168 hours.  Urinalysis    Component Value Date/Time   BILIRUBINUR neg 09/12/2013 1012   PROTEINUR neg 09/12/2013 1012   UROBILINOGEN negative 09/12/2013 1012   NITRITE neg 09/12/2013 1012   LEUKOCYTESUR Negative 09/12/2013 1012     STUDIES: Dg Ankle Complete Left  Result Date: 04/15/2018 CLINICAL DATA:  Heard a pop in LEFT ankle when stood up from using the restroom yesterday, fall, lateral LEFT ankle pain EXAM: LEFT ANKLE COMPLETE - 3+ VIEW COMPARISON:  None FINDINGS: Lateral soft tissue swelling. Mild osseous demineralization. Ankle joint space preserved. Rounded ossicle identified adjacent to tip of lateral malleolus, appears corticated and old. An additional linear calcific density is seen at the lateral joint line which could represent a tiny avulsion fragment. No additional fracture, dislocation, or bone destruction. IMPRESSION: Questionable tiny avulsion fragment at lateral joint line. Lateral soft tissue swelling. Electronically Signed   By: Lavonia Anna M.D.   On: 04/15/2018 11:40    ASSESSMENT: 67 y.o. [place], [place] woman status post right upper lobe wedge resection, middle lobe wedge resection, lower lobectomy and mediastinal lymph node dissection 06/06/2005 for a 1.2 cm grade 3 adenocarcinoma, pT1 pN1, stage Ia  (a) followed [facility] with every other year chest CT, most recently 06/06/2017  LEFT BREAST CANCER: (1) status post left breast upper outer quadrant biopsy 09/02/2009 for an invasive lobular  carcinoma (E-cadherin negative) estrogen receptor 98% positive, progesterone receptor 6% positive, with an MIB-1 of 14% and no HER-2 amplification. [[id number]]  (2) status post left lumpectomy and sentinel lymph node sampling 10/12/2009 for a pT1b pN1, stage IIA invasive lobular breast cancer, with negative margins.  (3) Oncotype DX score of 16 predicts a risk of outside the breast recurrence of 10% if the patient's only systemic treatment is tamoxifen for 5 years. It also predicts no benefit from adjuvant chemotherapy  (4) completed adjuvant radiation therapy 01/28/2010, receiving 5040 cGy to the left breast, with a boost to the upper inner aspect of the breast (to a cumulative dose of 6300 cGy); the axillary and  supraclavicular regions received 4500 cGy  (5) started anastrozole July 2011, completedI 5 years July 2016  RIGHT BREAST CANCER: (6) status post right breast upper outer quadrant biopsy 01/29/2018 for a clinical  T1c N0, stage IA invasive ductal carcinoma, grade 2, estrogen receptor positive, progesterone receptor negative, HER-2 amplified, with an MIB-1 of 15%  (7) status post bilateral mastectomy with right sentinel lymph node sampling 03/15/2018, showing  (a) on the left, no malignancy noted  (b) on the right, a pT1c pN0,stage IA invasive ductal carcinoma, grade 2, with negative margins.  (c) a total of 6 lymph nodes removed from the right axilla, none from the left  (8) chemotherapy to consist of paclitaxel weekly x12 with trastuzumab to be given for 1 year  (a) myocardial perfusion study 12/20/2017 showed an ejection fraction of 75% (hyperdynamic  (9) adjuvant radiation not indicated  (10) antiestrogens to follow at the completion of local treatment  (11) bone density 02/21/2012 at 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 There were no deleterious mutations in: APC, ATM, AXIN2, BARD1, BMPR1A, BRCA1, BRCA2, BRIP1, CDH1, CDKN2A (p14ARF), CDKN2A (p16INK4a), CKD4, CHEK2, CTNNA1, DICER1, EPCAM (Deletion/duplication testing only), GREM1 (promoter region deletion/duplication testing only), KIT, MEN1, MLH1, MSH2, MSH3, MSH6, MUTYH, NBN, NF1, NHTL1, PALB2, PDGFRA, PMS2, POLD1, POLE, PTEN, RAD50, RAD51C, RAD51D, SDHB, SDHC, SDHD, SMAD4, SMARCA4. STK11, TSC1, TSC2, and VHL.  The following genes were evaluated for sequence changes only: SDHA and HOXB13 c.251G>A variant only.  (a) see "cancer surveillance" below for details of long term Maylon Peppers related screening studies  PLAN: Caty did very well with  her surgery.  She is benefiting from physical therapy.  She is still having some hyperesthesia in the anterior chest and hopefully this will subside with time.  She finds that benzodiazepines are helpful in that regard  We discussed pain management in general and specifically the use of a nonsteroidal together with Tylenol preference to narcotics  We spent most of today's visit discussing paclitaxel and is possible toxicity side effects and complications.  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  She will start that and trastuzumab next week.  And we will use her June 2019 stress study ejection fraction as baseline.  She will need a repeat echocardiogram in January.  Her husband is being treated under the care of Dr. Marin Olp in [facility] and she may wish to receive her treatments there if that would be more convenient.  What we are going to do for now is to give her an "off" week on the week that her husband receives his for days.  That means she will have 2 cycles of  chemo initially and then skip the third week, repeated then the fourth fifth and 6 weeks and then skip the seventh week and so on.  Once she completes her chemotherapy we will change her trastuzumab treatments to every 3 weeks and we will institute long-term screening for other possible cancers.  She is already working closely with Dr. Havery Moros in that regard  I wrote her a wig prescription and we discussed optimal use of bras and prostheses while she is still recovering from her surgery  She will see me again 05/03/2018 with her second dose of chemotherapy.  She knows to call for any other issues that may develop before then.   Valentine, Anna Dad, MD  04/20/18 12:38 PM Medical Oncology and Hematology [facility] [address] [place], [place] [address] Tel. [phone]    Fax. [phone]  Alice Rieger, am acting as scribe for Chauncey Cruel MD.  I, Lurline Del MD, have reviewed the above  documentation for accuracy and completeness, and I agree with the above.    ADDENDUM: TP53 mutations Li-Fraumeni syndrome increases the risk for soft tissue sarcomas, osteosarcomas, pre-menopausal breast cancer in women, brain tumors, adrenocortical carcinoma, and leukemias. These cancers can occur in childhood, and throughout adulthood, with approximately 50% of individuals being diagnosed with cancer by age 97 and 90% by age 81.We discussed that Ms. Dalporto's family is not a classic Li-Fraumeni family, however this result is still important to share with family members as we cannot predict how it will affect other individuals and it is still important for Ms. Quach to have additional screening.   THINGS TO AVOID: Because of the increased risk for cancer, avoiding risks that can increase that risk is recommended. Therefore, limiting sun exposure and using sunscreen, and eliminating tobacco use and exposure to other known or suspected carcinogens is encouraged. There is evidence of increased sensitivity to ionizing radiation. Therefore individuals should avoid or minimize exposure to diagnostic and therapeutic radiation whenever possible, to reduce the risk of secondary radiation-induced malignancies is encouraged.  CANCER SURVEILLANCE:The following is recommended for following individuals with LFS includes:  1. Children and adults should undergo comprehensive annual physical examination including careful skin and neurological exams with the focus on risks for rare early onset cancer and second malignancies in cancer survivors. 2. To reduce the risk for breast cancer, prophylactic bilateral mastectomy is the most effective option.  3. To reduce the risk for colon cancer, adults should consider colonoscopy every 2-5 years beginning no later than age 82 years.  4. Whole body MRI with brain MRI annually.  It may be helpful to participate in a Li-Fraumeni Syndrome clinic so that updates on screening  and surveillance for the condition may be conveyed.

## 2018-04-20 ENCOUNTER — Inpatient Hospital Stay: Payer: Medicare Other | Attending: Oncology | Admitting: Oncology

## 2018-04-20 ENCOUNTER — Inpatient Hospital Stay: Payer: Medicare Other

## 2018-04-20 ENCOUNTER — Telehealth: Payer: Self-pay

## 2018-04-20 VITALS — BP 150/83 | HR 80 | Temp 98.0°F | Resp 18 | Ht 66.0 in | Wt 118.6 lb

## 2018-04-20 DIAGNOSIS — Z923 Personal history of irradiation: Secondary | ICD-10-CM | POA: Insufficient documentation

## 2018-04-20 DIAGNOSIS — Z85118 Personal history of other malignant neoplasm of bronchus and lung: Secondary | ICD-10-CM | POA: Insufficient documentation

## 2018-04-20 DIAGNOSIS — Z87891 Personal history of nicotine dependence: Secondary | ICD-10-CM

## 2018-04-20 DIAGNOSIS — Z79899 Other long term (current) drug therapy: Secondary | ICD-10-CM | POA: Insufficient documentation

## 2018-04-20 DIAGNOSIS — Z5112 Encounter for antineoplastic immunotherapy: Secondary | ICD-10-CM | POA: Diagnosis not present

## 2018-04-20 DIAGNOSIS — Z5111 Encounter for antineoplastic chemotherapy: Secondary | ICD-10-CM | POA: Diagnosis not present

## 2018-04-20 DIAGNOSIS — C50212 Malignant neoplasm of upper-inner quadrant of left female breast: Secondary | ICD-10-CM

## 2018-04-20 DIAGNOSIS — C3431 Malignant neoplasm of lower lobe, right bronchus or lung: Secondary | ICD-10-CM

## 2018-04-20 DIAGNOSIS — Z452 Encounter for adjustment and management of vascular access device: Secondary | ICD-10-CM | POA: Insufficient documentation

## 2018-04-20 DIAGNOSIS — R203 Hyperesthesia: Secondary | ICD-10-CM | POA: Insufficient documentation

## 2018-04-20 DIAGNOSIS — Z23 Encounter for immunization: Secondary | ICD-10-CM | POA: Insufficient documentation

## 2018-04-20 DIAGNOSIS — Z170 Estrogen receptor positive status [ER+]: Secondary | ICD-10-CM | POA: Insufficient documentation

## 2018-04-20 LAB — CBC WITH DIFFERENTIAL/PLATELET
Abs Immature Granulocytes: 0.01 10*3/uL (ref 0.00–0.07)
Basophils Absolute: 0 10*3/uL (ref 0.0–0.1)
Basophils Relative: 0 %
EOS ABS: 0.1 10*3/uL (ref 0.0–0.5)
EOS PCT: 2 %
HEMATOCRIT: 36.8 % (ref 36.0–46.0)
HEMOGLOBIN: 11.7 g/dL — AB (ref 12.0–15.0)
Immature Granulocytes: 0 %
LYMPHS PCT: 22 %
Lymphs Abs: 1.1 10*3/uL (ref 0.7–4.0)
MCH: 29.8 pg (ref 26.0–34.0)
MCHC: 31.8 g/dL (ref 30.0–36.0)
MCV: 93.9 fL (ref 80.0–100.0)
MONO ABS: 0.5 10*3/uL (ref 0.1–1.0)
Monocytes Relative: 11 %
Neutro Abs: 3 10*3/uL (ref 1.7–7.7)
Neutrophils Relative %: 65 %
Platelets: 242 10*3/uL (ref 150–400)
RBC: 3.92 MIL/uL (ref 3.87–5.11)
RDW: 13.1 % (ref 11.5–15.5)
WBC: 4.7 10*3/uL (ref 4.0–10.5)
nRBC: 0 % (ref 0.0–0.2)

## 2018-04-20 LAB — COMPREHENSIVE METABOLIC PANEL
ALT: 9 U/L (ref 0–44)
AST: 16 U/L (ref 15–41)
Albumin: 3.8 g/dL (ref 3.5–5.0)
Alkaline Phosphatase: 99 U/L (ref 38–126)
Anion gap: 9 (ref 5–15)
BILIRUBIN TOTAL: 0.6 mg/dL (ref 0.3–1.2)
BUN: 16 mg/dL (ref 8–23)
CALCIUM: 9.9 mg/dL (ref 8.9–10.3)
CO2: 27 mmol/L (ref 22–32)
CREATININE: 0.73 mg/dL (ref 0.44–1.00)
Chloride: 103 mmol/L (ref 98–111)
GFR calc non Af Amer: >60 mL/min (ref >60)
Glucose, Bld: 95 mg/dL (ref 70–99)
Potassium: 4.3 mmol/L (ref 3.5–5.1)
Sodium: 139 mmol/L (ref 135–145)
TOTAL PROTEIN: 7.8 g/dL (ref 6.5–8.1)

## 2018-04-20 MED ORDER — PROCHLORPERAZINE MALEATE 10 MG PO TABS: 10.0000 mg | ORAL_TABLET | Freq: Four times a day (QID) | ORAL | 1 refills | Status: DC | PRN

## 2018-04-20 MED ORDER — LIDOCAINE-PRILOCAINE 2.5-2.5 % EX CREA: TOPICAL_CREAM | CUTANEOUS | 3 refills | Status: DC

## 2018-04-20 MED ORDER — INFLUENZA VAC SPLIT QUAD 0.5 ML IM SUSY
PREFILLED_SYRINGE | INTRAMUSCULAR | Status: AC
  Filled 2018-04-20: qty 0.5

## 2018-04-20 MED ORDER — INFLUENZA VAC SPLIT QUAD 0.5 ML IM SUSY
0.5000 mL | PREFILLED_SYRINGE | Freq: Once | INTRAMUSCULAR | Status: AC
  Administered 2018-04-20: 0.5 mL via INTRAMUSCULAR

## 2018-04-20 NOTE — Progress Notes (Signed)
START ON PATHWAY REGIMEN - Breast   Paclitaxel Weekly + Trastuzumab Weekly:   Administer weekly:     Paclitaxel      Trastuzumab-xxxx      Trastuzumab-xxxx   **Always confirm dose/schedule in your pharmacy ordering system**  Trastuzumab (Maintenance - NO Loading Dose):   A cycle is every 21 days:     Trastuzumab-xxxx   **Always confirm dose/schedule in your pharmacy ordering system**  Patient Characteristics: Postoperative without Neoadjuvant Therapy (Pathologic Staging), Invasive Disease, Adjuvant Therapy, HER2 Positive, ER Positive, Node Negative, pT1c, pN0/N1mi Therapeutic Status: Postoperative without Neoadjuvant Therapy (Pathologic Staging) AJCC Grade: G2 AJCC N Category: pN0 AJCC M Category: cM0 ER Status: Positive (+) AJCC 8 Stage Grouping: IA HER2 Status: Positive (+) Oncotype Dx Recurrence Score: Not Appropriate AJCC T Category: pT1c PR Status: Positive (+) Intent of Therapy: Curative Intent, Discussed with Patient 

## 2018-04-20 NOTE — Telephone Encounter (Signed)
Per 10/11 los Printed avs and calender of upcoming appointment. Patient expressed that she spoke with [facility] concerning having some appointments cancel due to husband is a patient of [facility]. So I scheduled 7 weeks based on protocol. Per care plan is scheduled with taxol/hercptin and scheduled based on the care plan.

## 2018-04-23 ENCOUNTER — Telehealth: Payer: Self-pay

## 2018-04-23 ENCOUNTER — Inpatient Hospital Stay: Payer: Medicare Other

## 2018-04-23 ENCOUNTER — Other Ambulatory Visit: Payer: Medicare Other

## 2018-04-23 NOTE — Telephone Encounter (Signed)
Spoke with patient concerning readjusting her schedule due to duration of infusion was lesser than originally scheduled. Per 10/14 follow up

## 2018-04-24 ENCOUNTER — Ambulatory Visit: Payer: Medicare Other

## 2018-04-24 DIAGNOSIS — R222 Localized swelling, mass and lump, trunk: Secondary | ICD-10-CM | POA: Diagnosis not present

## 2018-04-24 DIAGNOSIS — Z483 Aftercare following surgery for neoplasm: Secondary | ICD-10-CM | POA: Diagnosis not present

## 2018-04-24 DIAGNOSIS — M25612 Stiffness of left shoulder, not elsewhere classified: Secondary | ICD-10-CM

## 2018-04-24 DIAGNOSIS — M25611 Stiffness of right shoulder, not elsewhere classified: Secondary | ICD-10-CM

## 2018-04-24 NOTE — Therapy (Signed)
[place], [place], [address] Phone: [phone]   Fax:  [phone]  Physical Therapy Treatment  Patient Details  Name: Anna Valentine MRN: [id number] Date of Birth: 01-21-1951 Referring Provider (PT): Dr. Donne Hazel    Encounter Date: 04/24/2018  PT End of Session - 04/24/18 1104    Visit Number  6    Number of Visits  9    Date for PT Re-Evaluation  05/04/18    PT Start Time  1021    PT Stop Time  1107    PT Time Calculation (min)  46 min    Activity Tolerance  Patient tolerated treatment well    Behavior During Therapy  [facility] for tasks assessed/performed       Past Medical History:  Diagnosis Date  . Anemia   . BRCA negative 2011   BRCA I/ II negative  . Breast cancer ([place]) 08/2009   stage 2, rx with lumpectomy and xrt  . COPD (chronic obstructive pulmonary disease) (HCC)    pt.unsure of diagnosis status  . Emphysema of lung ([place])    pt. questions diagnosis  . Family history of adverse reaction to anesthesia    My Sister has nausea  . Family history of breast cancer   . Family history of colon cancer   . Family history of lung cancer   . GERD (gastroesophageal reflux disease)    not presently having symptoms  . Lung cancer ([place]) 06/06/05   stage 1 poorly differentiated adenocarcinoma, s/p right lower lobectomy.  . Osteoporosis   . Personal history of lung cancer   . STD (sexually transmitted disease)    HSV    Past Surgical History:  Procedure Laterality Date  . BREAST BIOPSY    . BREAST LUMPECTOMY  10/12/2009   Left lumpectomy and radiation, stage II, ER/PR+, Her 2 nu negative  . BUNIONECTOMY    . COLONOSCOPY    . LOBECTOMY  06/06/2005   RLL  . MASTECTOMY W/ SENTINEL NODE BIOPSY Bilateral 03/15/2018  . MASTECTOMY W/ SENTINEL NODE BIOPSY Bilateral 03/15/2018   Procedure: BILATERAL TOTAL MASTECTOMIES WITH RIGHT SENTINEL LYMPH NODE BIOPSY;  Surgeon: Rolm Bookbinder, MD;   Location: [place];  Service: General;  Laterality: Bilateral;  . PORTACATH PLACEMENT N/A 03/15/2018   Procedure: INSERTION PORT-A-CATH WITH [place];  Surgeon: Rolm Bookbinder, MD;  Location: [place];  Service: General;  Laterality: N/A;  . TONSILLECTOMY    . TUBAL LIGATION  1984    There were no vitals filed for this visit.  Subjective Assessment - 04/24/18 1025    Subjective  I feel like my ROM is getting better in my Rt shoulder, and definite imrpovement with Lt. I see the orthopedist tomorrow and hope to get my boot off! I start chemo Friday.     Pertinent History   New right breast cancer diagnosed on 01/25/2018.  She underwent a bilateral mastectomy wit 6 lymph nodes removed on the right on 03/15/2018. 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  She also had a port placed on the right side at that time.   Pt had previous left breast cancer 2008 with sentinel node removal and radiation, lung cancer in 2006 She has history of frozen shoulder on the right that has resolved and the start of frozen shoulder on the left that resolved with radiation.  She has LFS .    Patient Stated Goals  to keep  a good posture and get rid of the pain     Currently in Pain?  No/denies         [facility] PT Assessment - 04/24/18 0001      AROM   Right Shoulder Flexion  154 Degrees    Right Shoulder ABduction  155 Degrees    Left Shoulder Flexion  152 Degrees    Left Shoulder ABduction  149 Degrees                   [facility] Adult PT Treatment/Exercise - 04/24/18 0001      Shoulder Exercises: Supine   Horizontal ABduction  Strengthening;Both;10 reps;Theraband    Theraband Level (Shoulder Horizontal ABduction)  Level 2 (Red)    External Rotation  Strengthening;Both;10 reps;Theraband    Theraband Level (Shoulder External Rotation)  Level 2 (Red)    Flexion  Strengthening;Both;10 reps;Theraband   Narrow and Wide Grip, 10 times each   Theraband Level (Shoulder Flexion)   Level 2 (Red)    Diagonals  Strengthening;Both;Theraband;5 reps   5 reps only due to time   Theraband Level (Shoulder Diagonals)  Level 2 (Red)      Manual Therapy   Myofascial Release  To chest and incision supine to tolerance bilaterally, left scar more tight than right and more tender    Passive ROM  In Supine to bil shoulders into flexion, abduction and D2 to pts tolerance    Neural Stretch  To right UE in supine             PT Education - 04/24/18 1143    Education Details  Supine scapular series with red theraband    Person(s) Educated  Patient    Methods  Explanation;Demonstration;Handout    Comprehension  Verbalized understanding;Returned demonstration          PT Long Term Goals - 04/24/18 1150      PT LONG TERM GOAL #1   Title  Pt will report pain is reduced by 50%    Baseline  Pt reports pain mcu improved but did not rate today-04/24/18    Status  Partially Met      PT LONG TERM GOAL #2   Title  Pt will have 150 degrees of bilateral shoulder abduction so that she can return to all household activities at home     Baseline  Rt 155 and Lt 149 degrees-04/24/18    Status  Partially Met      PT LONG TERM GOAL #3   Title  Pt will reduce quick DASH score to < 10 indicating a functional improvment of UEs    Baseline  27.27 on 04/04/2018    Status  On-going      PT LONG TERM GOAL #4   Title  Pt will vebalize understanding of lymphedema risk reduction practices     Baseline  Pt attended ABC class and has no further questions regarding lymphedmea risk reduction-04/24/18    Status  Achieved            Plan - 04/24/18 1145    Clinical Impression Statement  Continued with focus to manual therapy for end P/ROM progression but also added supine scapular series for HEP progression. Pt tolerated these very well and reported feeling good at end of session. Her A/ROM has improved well also since last measured and pt is progressing well towards meeting her goals.      Rehab Potential  Excellent    Clinical Impairments Affecting Rehab Potential  recent surgery    PT Frequency  2x / week    PT Duration  4 weeks    PT Treatment/Interventions  ADLs/Self Care Home Management;Manual techniques;Orthotic Fit/Training;Patient/family education;Manual lymph drainage;Compression bandaging;Scar mobilization;Passive range of motion    PT Next Visit Plan  Continue PROM to bilateral shoulders, add ball up wall (if doesn't have to wear orthopedic boot) and pulleys, continue scar massage/myofacial release to chest; review supine scapular series prn and progress    Consulted and Agree with Plan of Care  Patient       Patient will benefit from skilled therapeutic intervention in order to improve the following deficits and impairments:  Pain, Impaired sensation, Increased fascial restricitons, Decreased scar mobility, Postural dysfunction, Decreased range of motion, Decreased strength, Impaired UE functional use, Increased edema  Visit Diagnosis: Stiffness of right shoulder, not elsewhere classified  Stiffness of left shoulder, not elsewhere classified  Aftercare following surgery for neoplasm  Localized swelling, mass and lump, trunk     Problem List Patient Active Problem List   Diagnosis Date Noted  . Breast cancer, right ([place]) 03/15/2018  . Genetic testing 02/20/2018  . Family history of breast cancer   . Family history of lung cancer   . Personal history of lung cancer   . Chest pain 11/28/2017  . Abnormal CT of the chest 11/30/2016  . Malignant neoplasm of upper-inner quadrant of left breast in female, estrogen receptor positive ([place]) 06/16/2014  . Osteopenia 09/13/2013  . Postmenopausal atrophic vaginitis 09/13/2013  . Unspecified vitamin D deficiency 09/13/2013  . Malignant neoplasm of lower lobe of right lung ([place]) 07/24/2012  . History of lung cancer 03/11/2011  . Anxiety 03/11/2011  . History of neutropenia 03/11/2011  . Family history of colon  cancer 03/11/2011  . Cough 11/15/2010  . Bronchiectasis without acute exacerbation ([place]) 11/15/2010    Otelia Limes, PTA 04/24/2018, 12:00 PM  [place], [place], [address] Phone: [phone]   Fax:  [phone]  Name: Anna Valentine MRN: [id number] Date of Birth: 1950/10/01

## 2018-04-24 NOTE — Patient Instructions (Signed)
Over Head Pull: Narrow and Wide Grip   Cancer Rehab [phone]   On back, knees bent, feet flat, band across thighs, elbows straight but relaxed. Pull hands apart (start). Keeping elbows straight, bring arms up and over head, hands toward floor. Keep pull steady on band. Hold momentarily. Return slowly, keeping pull steady, back to start. Then do same with a wider grip on the band (past shoulder width) Repeat _10__ times. Band color __red____   Side Pull: Double Arm   On back, knees bent, feet flat. Arms perpendicular to body, shoulder level, elbows straight but relaxed. Pull arms out to sides, elbows straight. Resistance band comes across collarbones, hands toward floor. Hold momentarily. Slowly return to starting position. Repeat 10__ times. Band color _red____   Sword   On back, knees bent, feet flat, left hand on left hip, right hand above left. Pull right arm DIAGONALLY (hip to shoulder) across chest. Bring right arm along head toward floor. Hold momentarily. Slowly return to starting position. Repeat _10__ times. Do with left arm. Band color _red_____   Shoulder Rotation: Double Arm   On back, knees bent, feet flat, elbows tucked at sides, bent 90, hands palms up. Pull hands apart and down toward floor, keeping elbows near sides. Hold momentarily. Slowly return to starting position. Repeat _10__ times. Band color __red____    

## 2018-04-24 NOTE — Progress Notes (Signed)
PA for Emla cream has been approved.

## 2018-04-25 DIAGNOSIS — S82832A Other fracture of upper and lower end of left fibula, initial encounter for closed fracture: Secondary | ICD-10-CM | POA: Diagnosis not present

## 2018-04-26 ENCOUNTER — Inpatient Hospital Stay: Payer: Medicare Other

## 2018-04-26 ENCOUNTER — Ambulatory Visit: Payer: Medicare Other

## 2018-04-26 ENCOUNTER — Encounter: Payer: Self-pay | Admitting: Oncology

## 2018-04-26 DIAGNOSIS — M25611 Stiffness of right shoulder, not elsewhere classified: Secondary | ICD-10-CM | POA: Diagnosis not present

## 2018-04-26 DIAGNOSIS — M25612 Stiffness of left shoulder, not elsewhere classified: Secondary | ICD-10-CM

## 2018-04-26 DIAGNOSIS — Z95828 Presence of other vascular implants and grafts: Secondary | ICD-10-CM

## 2018-04-26 DIAGNOSIS — C50212 Malignant neoplasm of upper-inner quadrant of left female breast: Secondary | ICD-10-CM | POA: Diagnosis not present

## 2018-04-26 DIAGNOSIS — R222 Localized swelling, mass and lump, trunk: Secondary | ICD-10-CM | POA: Diagnosis not present

## 2018-04-26 DIAGNOSIS — Z79899 Other long term (current) drug therapy: Secondary | ICD-10-CM | POA: Diagnosis not present

## 2018-04-26 DIAGNOSIS — R203 Hyperesthesia: Secondary | ICD-10-CM | POA: Diagnosis not present

## 2018-04-26 DIAGNOSIS — Z5111 Encounter for antineoplastic chemotherapy: Secondary | ICD-10-CM | POA: Diagnosis not present

## 2018-04-26 DIAGNOSIS — Z5112 Encounter for antineoplastic immunotherapy: Secondary | ICD-10-CM | POA: Diagnosis not present

## 2018-04-26 DIAGNOSIS — Z170 Estrogen receptor positive status [ER+]: Secondary | ICD-10-CM | POA: Diagnosis not present

## 2018-04-26 DIAGNOSIS — Z483 Aftercare following surgery for neoplasm: Secondary | ICD-10-CM

## 2018-04-26 LAB — CBC WITH DIFFERENTIAL/PLATELET
Abs Immature Granulocytes: 0.01 10*3/uL (ref 0.00–0.07)
BASOS PCT: 1 %
Basophils Absolute: 0 10*3/uL (ref 0.0–0.1)
EOS PCT: 4 %
Eosinophils Absolute: 0.2 10*3/uL (ref 0.0–0.5)
HEMATOCRIT: 33.7 % — AB (ref 36.0–46.0)
Hemoglobin: 11 g/dL — ABNORMAL LOW (ref 12.0–15.0)
Immature Granulocytes: 0 %
Lymphocytes Relative: 26 %
Lymphs Abs: 1 10*3/uL (ref 0.7–4.0)
MCH: 30.1 pg (ref 26.0–34.0)
MCHC: 32.6 g/dL (ref 30.0–36.0)
MCV: 92.3 fL (ref 80.0–100.0)
MONO ABS: 0.3 10*3/uL (ref 0.1–1.0)
MONOS PCT: 8 %
Neutro Abs: 2.4 10*3/uL (ref 1.7–7.7)
Neutrophils Relative %: 61 %
PLATELETS: 244 10*3/uL (ref 150–400)
RBC: 3.65 MIL/uL — AB (ref 3.87–5.11)
RDW: 12.9 % (ref 11.5–15.5)
WBC: 3.9 10*3/uL — AB (ref 4.0–10.5)
nRBC: 0 % (ref 0.0–0.2)

## 2018-04-26 LAB — COMPREHENSIVE METABOLIC PANEL
ALT: 11 U/L (ref 0–44)
AST: 19 U/L (ref 15–41)
Albumin: 3.7 g/dL (ref 3.5–5.0)
Alkaline Phosphatase: 93 U/L (ref 38–126)
Anion gap: 10 (ref 5–15)
BUN: 11 mg/dL (ref 8–23)
CHLORIDE: 107 mmol/L (ref 98–111)
CO2: 26 mmol/L (ref 22–32)
Calcium: 9.3 mg/dL (ref 8.9–10.3)
Creatinine, Ser: 0.62 mg/dL (ref 0.44–1.00)
GFR calc Af Amer: >60 mL/min (ref >60)
GLUCOSE: 88 mg/dL (ref 70–99)
POTASSIUM: 4.2 mmol/L (ref 3.5–5.1)
Sodium: 143 mmol/L (ref 135–145)
Total Bilirubin: 0.3 mg/dL (ref 0.3–1.2)
Total Protein: 7.2 g/dL (ref 6.5–8.1)

## 2018-04-26 MED ORDER — SODIUM CHLORIDE 0.9% FLUSH
10.0000 mL | Freq: Once | INTRAVENOUS | Status: AC
  Administered 2018-04-26: 10 mL
  Filled 2018-04-26: qty 10

## 2018-04-26 MED ORDER — HEPARIN SOD (PORK) LOCK FLUSH 100 UNIT/ML IV SOLN
500.0000 [IU] | Freq: Once | INTRAVENOUS | Status: AC
  Administered 2018-04-26: 500 [IU]
  Filled 2018-04-26: qty 5

## 2018-04-26 NOTE — Progress Notes (Signed)
Called pt to introduce myself as her Arboriculturist.  Pt has 2 insurances so copay assistance shouldn't be needed.  I offered the [employer] grant to help w/ personal bills and transportation but she declined.  She doesn't need assistance at this time.  I will give her my card on 04/27/18 to contact me in the future if her situation changes.

## 2018-04-26 NOTE — Therapy (Signed)
[place], [place], [address] Phone: [phone]   Fax:  [phone]  Physical Therapy Treatment  Patient Details  Name: Anna Valentine MRN: [id number] Date of Birth: [birth year] Referring Provider (PT): Dr. Donne Hazel    Encounter Date: 04/26/2018  PT End of Session - 04/26/18 1125    Visit Number  7    Number of Visits  9    Date for PT Re-Evaluation  05/04/18    PT Start Time  1020    PT Stop Time  1109    PT Time Calculation (min)  49 min    Activity Tolerance  Patient tolerated treatment well    Behavior During Therapy  [facility] for tasks assessed/performed       Past Medical History:  Diagnosis Date  . Anemia   . BRCA negative 2011   BRCA I/ II negative  . Breast cancer ([place]) 08/2009   stage 2, rx with lumpectomy and xrt  . COPD (chronic obstructive pulmonary disease) (HCC)    pt.unsure of diagnosis status  . Emphysema of lung ([place])    pt. questions diagnosis  . Family history of adverse reaction to anesthesia    My Sister has nausea  . Family history of breast cancer   . Family history of colon cancer   . Family history of lung cancer   . GERD (gastroesophageal reflux disease)    not presently having symptoms  . Lung cancer ([place]) 06/06/05   stage 1 poorly differentiated adenocarcinoma, s/p right lower lobectomy.  . Osteoporosis   . Personal history of lung cancer   . STD (sexually transmitted disease)    HSV    Past Surgical History:  Procedure Laterality Date  . BREAST BIOPSY    . BREAST LUMPECTOMY  10/12/2009   Left lumpectomy and radiation, stage II, ER/PR+, Her 2 nu negative  . BUNIONECTOMY    . COLONOSCOPY    . LOBECTOMY  06/06/2005   RLL  . MASTECTOMY W/ SENTINEL NODE BIOPSY Bilateral 03/15/2018  . MASTECTOMY W/ SENTINEL NODE BIOPSY Bilateral 03/15/2018   Procedure: BILATERAL TOTAL MASTECTOMIES WITH RIGHT SENTINEL LYMPH NODE BIOPSY;  Surgeon: Rolm Bookbinder, MD;   Location: [place];  Service: General;  Laterality: Bilateral;  . PORTACATH PLACEMENT N/A 03/15/2018   Procedure: INSERTION PORT-A-CATH WITH [place];  Surgeon: Rolm Bookbinder, MD;  Location: [place];  Service: General;  Laterality: N/A;  . TONSILLECTOMY    . TUBAL LIGATION  1984    There were no vitals filed for this visit.  Subjective Assessment - 04/26/18 1024    Subjective  I saw the orthopedist yesterday and I don't have to wear the boot anymore! I did the new theraband exercises yesterday and they are going fine.     Pertinent History   New right breast cancer diagnosed on 01/25/2018.  She underwent a bilateral mastectomy wit 6 lymph nodes removed on the right on 03/15/2018. 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  She also had a port placed on the right side at that time.   Pt had previous left breast cancer 2008 with sentinel node removal and radiation, lung cancer in 2006 She has history of frozen shoulder on the right that has resolved and the start of frozen shoulder on the left that resolved with radiation.  She has LFS .    Patient Stated Goals  to keep a good posture and get rid of  the pain     Currently in Pain?  No/denies                       [facility] Adult PT Treatment/Exercise - 04/26/18 0001      Shoulder Exercises: Pulleys   ABduction  1 minute    ABduction Limitations  Just to instrct pt briefly per her request      Shoulder Exercises: Therapy Ball   Flexion  Both;10 reps   1 lb each wrist; forward lean into end of stretch   Flexion Limitations  Also instructed pt with demonstration how to perfrom this in doorway as she reports not wanting to get another ball at home. She returned good demo.     ABduction  Right;Left;5 reps   1 lb each wrist; same side lean into end of stretch     Shoulder Exercises: Stretch   Corner Stretch  3 reps;20 seconds   In doorway   Wall Stretch - ABduction  5 reps   5 sec; "snow angel" with  back against wall and core engaged   Other Shoulder Stretches  Neural tension stretch 3x for 5 sec, also instructed how to "floss"      Manual Therapy   Myofascial Release  To axilla, chest and incision supine to tolerance bilaterally, left scar more tight than right and more tender    Passive ROM  In Supine to bil shoulders into flexion, abduction and D2 to pts tolerance    Neural Stretch  To right UE in supine             PT Education - 04/26/18 1143    Education Details  Varying bil UE and neural stretches    Person(s) Educated  Patient    Methods  Explanation;Demonstration;Handout    Comprehension  Verbalized understanding;Returned demonstration          PT Long Term Goals - 04/24/18 1150      PT LONG TERM GOAL #1   Title  Pt will report pain is reduced by 50%    Baseline  Pt reports pain mcu improved but did not rate today-04/24/18    Status  Partially Met      PT LONG TERM GOAL #2   Title  Pt will have 150 degrees of bilateral shoulder abduction so that she can return to all household activities at home     Baseline  Rt 155 and Lt 149 degrees-04/24/18    Status  Partially Met      PT LONG TERM GOAL #3   Title  Pt will reduce quick DASH score to < 10 indicating a functional improvment of UEs    Baseline  27.27 on 04/04/2018    Status  On-going      PT LONG TERM GOAL #4   Title  Pt will vebalize understanding of lymphedema risk reduction practices     Baseline  Pt attended ABC class and has no further questions regarding lymphedmea risk reduction-04/24/18    Status  Achieved            Plan - 04/26/18 1134    Clinical Impression Statement  Began with focus on manual therapy for end ROM to bil shoulders as pt is still limited with this though is improving, also including myofascial release to bil axillae and upper quadrants. Also progressed pt to include AA/ROM and new stretches, and added these to her HEP.       Rehab Potential  Excellent  Clinical  Impairments Affecting Rehab Potential  recent surgery    PT Frequency  2x / week    PT Duration  4 weeks    PT Treatment/Interventions  ADLs/Self Care Home Management;Manual techniques;Orthotic Fit/Training;Patient/family education;Manual lymph drainage;Compression bandaging;Scar mobilization;Passive range of motion    PT Next Visit Plan  Continue PROM to bilateral shoulders, cont AA/ROM and review HEP, continue scar massage/myofacial release to chest; review supine scapular series prn and progress    Consulted and Agree with Plan of Care  Patient       Patient will benefit from skilled therapeutic intervention in order to improve the following deficits and impairments:  Pain, Impaired sensation, Increased fascial restricitons, Decreased scar mobility, Postural dysfunction, Decreased range of motion, Decreased strength, Impaired UE functional use, Increased edema  Visit Diagnosis: Stiffness of right shoulder, not elsewhere classified  Stiffness of left shoulder, not elsewhere classified  Aftercare following surgery for neoplasm  Localized swelling, mass and lump, trunk     Problem List Patient Active Problem List   Diagnosis Date Noted  . Port-A-Cath in place 04/26/2018  . Breast cancer, right ([place]) 03/15/2018  . Genetic testing 02/20/2018  . Family history of breast cancer   . Family history of lung cancer   . Personal history of lung cancer   . Chest pain 11/28/2017  . Abnormal CT of the chest 11/30/2016  . Malignant neoplasm of upper-inner quadrant of left breast in female, estrogen receptor positive ([place]) 06/16/2014  . Osteopenia 09/13/2013  . Postmenopausal atrophic vaginitis 09/13/2013  . Unspecified vitamin D deficiency 09/13/2013  . Malignant neoplasm of lower lobe of right lung ([place]) 07/24/2012  . History of lung cancer 03/11/2011  . Anxiety 03/11/2011  . History of neutropenia 03/11/2011  . Family history of colon cancer 03/11/2011  . Cough 11/15/2010  .  Bronchiectasis without acute exacerbation ([place]) 11/15/2010    Otelia Limes, PTA 04/26/2018, 11:44 AM  [place], [place], [address] Phone: [phone]   Fax:  [phone]  Name: Anna Valentine MRN: [id number] Date of Birth: 1951-02-17

## 2018-04-26 NOTE — Patient Instructions (Signed)
Upper Limb Neural Tension: Median I    Stand with left palm flat on wall, fingers up. Straighten elbow and Hold __5__ seconds.  Do __5__ times per session. Do __2-3__ sessions per day. Can also "floss" nerve up to 5 times.  Can also do modified downward dog on wall keeping abdominals tight, preventing arch in back. Shift hips back until stretch felt in armpits.  Hold 5 sec, repeat 5 times.   With back against wall do a "snow angel" keeping back of arms and hands on wall. Hold 5 sec when stretch felt, repeat 5-10 times.    CHEST: Doorway, Bilateral - Standing    Standing in doorway with one foot in front of other, place hands on wall with elbows bent at shoulder height. Lean forward. Hold _20-30__ seconds. 3-5___ reps per set, _2-3__ sets per day.    Cancer Rehab [phone]

## 2018-04-27 ENCOUNTER — Other Ambulatory Visit: Payer: Self-pay | Admitting: Oncology

## 2018-04-27 ENCOUNTER — Inpatient Hospital Stay: Payer: Medicare Other

## 2018-04-27 VITALS — BP 143/83 | HR 79 | Temp 97.5°F | Resp 17 | Wt 117.0 lb

## 2018-04-27 DIAGNOSIS — R203 Hyperesthesia: Secondary | ICD-10-CM | POA: Diagnosis not present

## 2018-04-27 DIAGNOSIS — Z5111 Encounter for antineoplastic chemotherapy: Secondary | ICD-10-CM | POA: Diagnosis not present

## 2018-04-27 DIAGNOSIS — Z5112 Encounter for antineoplastic immunotherapy: Secondary | ICD-10-CM | POA: Diagnosis not present

## 2018-04-27 DIAGNOSIS — Z170 Estrogen receptor positive status [ER+]: Principal | ICD-10-CM

## 2018-04-27 DIAGNOSIS — Z79899 Other long term (current) drug therapy: Secondary | ICD-10-CM | POA: Diagnosis not present

## 2018-04-27 DIAGNOSIS — C50212 Malignant neoplasm of upper-inner quadrant of left female breast: Secondary | ICD-10-CM

## 2018-04-27 MED ORDER — DIPHENHYDRAMINE HCL 50 MG/ML IJ SOLN
25.0000 mg | Freq: Once | INTRAMUSCULAR | Status: AC
  Administered 2018-04-27: 25 mg via INTRAVENOUS

## 2018-04-27 MED ORDER — SODIUM CHLORIDE 0.9 % IV SOLN
20.0000 mg | Freq: Once | INTRAVENOUS | Status: AC
  Administered 2018-04-27: 20 mg via INTRAVENOUS
  Filled 2018-04-27: qty 2

## 2018-04-27 MED ORDER — EPINEPHRINE PF 1 MG/10ML IJ SOSY
0.2500 mg | PREFILLED_SYRINGE | Freq: Once | INTRAMUSCULAR | Status: AC | PRN
  Administered 2018-04-27: 0.25 mg via INTRAVENOUS
  Filled 2018-04-27: qty 10

## 2018-04-27 MED ORDER — DIPHENHYDRAMINE HCL 50 MG/ML IJ SOLN
INTRAMUSCULAR | Status: AC
  Filled 2018-04-27: qty 1

## 2018-04-27 MED ORDER — SODIUM CHLORIDE 0.9 % IV SOLN
80.0000 mg/m2 | Freq: Once | INTRAVENOUS | Status: AC
  Administered 2018-04-27: 126 mg via INTRAVENOUS
  Filled 2018-04-27: qty 21

## 2018-04-27 MED ORDER — ACETAMINOPHEN 325 MG PO TABS
ORAL_TABLET | ORAL | Status: AC
  Filled 2018-04-27: qty 2

## 2018-04-27 MED ORDER — TRASTUZUMAB CHEMO 150 MG IV SOLR
4.0000 mg/kg | Freq: Once | INTRAVENOUS | Status: AC
  Administered 2018-04-27: 210 mg via INTRAVENOUS
  Filled 2018-04-27: qty 10

## 2018-04-27 MED ORDER — PROCHLORPERAZINE EDISYLATE 10 MG/2ML IJ SOLN
INTRAMUSCULAR | Status: AC
  Filled 2018-04-27: qty 2

## 2018-04-27 MED ORDER — METHYLPREDNISOLONE SODIUM SUCC 125 MG IJ SOLR
125.0000 mg | Freq: Once | INTRAMUSCULAR | Status: AC | PRN
  Administered 2018-04-27: 125 mg via INTRAVENOUS

## 2018-04-27 MED ORDER — PROCHLORPERAZINE EDISYLATE 10 MG/2ML IJ SOLN
10.0000 mg | Freq: Once | INTRAMUSCULAR | Status: AC
  Administered 2018-04-27: 10 mg via INTRAVENOUS
  Filled 2018-04-27: qty 2

## 2018-04-27 MED ORDER — SODIUM CHLORIDE 0.9% FLUSH
10.0000 mL | INTRAVENOUS | Status: DC | PRN
  Administered 2018-04-27: 10 mL
  Filled 2018-04-27: qty 10

## 2018-04-27 MED ORDER — FAMOTIDINE IN NACL 20-0.9 MG/50ML-% IV SOLN
INTRAVENOUS | Status: AC
  Filled 2018-04-27: qty 50

## 2018-04-27 MED ORDER — FAMOTIDINE IN NACL 20-0.9 MG/50ML-% IV SOLN
20.0000 mg | Freq: Once | INTRAVENOUS | Status: AC
  Administered 2018-04-27: 20 mg via INTRAVENOUS

## 2018-04-27 MED ORDER — ACETAMINOPHEN 325 MG PO TABS
650.0000 mg | ORAL_TABLET | Freq: Once | ORAL | Status: AC
  Administered 2018-04-27: 650 mg via ORAL

## 2018-04-27 MED ORDER — SODIUM CHLORIDE 0.9 % IV SOLN
Freq: Once | INTRAVENOUS | Status: AC
  Administered 2018-04-27: 09:00:00 via INTRAVENOUS
  Filled 2018-04-27: qty 250

## 2018-04-27 MED ORDER — HEPARIN SOD (PORK) LOCK FLUSH 100 UNIT/ML IV SOLN
500.0000 [IU] | Freq: Once | INTRAVENOUS | Status: AC | PRN
  Administered 2018-04-27: 500 [IU]
  Filled 2018-04-27: qty 5

## 2018-04-27 NOTE — Progress Notes (Signed)
DISCONTINUE ON PATHWAY REGIMEN - Breast   Paclitaxel Weekly + Trastuzumab Weekly:   Administer weekly:     Paclitaxel      Trastuzumab-xxxx      Trastuzumab-xxxx   **Always confirm dose/schedule in your pharmacy ordering system**  Trastuzumab (Maintenance - NO Loading Dose):   A cycle is every 21 days:     Trastuzumab-xxxx   **Always confirm dose/schedule in your pharmacy ordering system**  REASON: Toxicities / Adverse Event PRIOR TREATMENT: [id number]: Weekly Paclitaxel + Trastuzumab x 12 Weeks, Followed by Trastuzumab Maintenance q21 Days x 13 Cycles TREATMENT RESPONSE: Unable to Evaluate  START OFF PATHWAY REGIMEN - Breast   [id number]:Nab-Paclitaxel (Abraxane(R)) 100 mg/m2 D1,8,15 q28 days:   A cycle is every 28 days (3 weeks on and 1 week off):     Nab-paclitaxel (protein bound)   **Always confirm dose/schedule in your pharmacy ordering system**  Patient Characteristics: Postoperative without Neoadjuvant Therapy (Pathologic Staging), Invasive Disease, Adjuvant Therapy, HER2 Positive, ER Positive, Node Negative, pT1c, pN0/N54m Therapeutic Status: Postoperative without Neoadjuvant Therapy (Pathologic Staging) AJCC Grade: G2 AJCC N Category: pN0 AJCC M Category: cM0 ER Status: Positive (+) AJCC 8 Stage Grouping: IA HER2 Status: Positive (+) Oncotype Dx Recurrence Score: Not Appropriate AJCC T Category: pT1c PR Status: Positive (+) Intent of Therapy: Curative Intent, Discussed with Patient

## 2018-04-27 NOTE — Patient Instructions (Addendum)
[facility] Discharge Instructions for Patients Receiving Chemotherapy  Today you received the following chemotherapy agents Taxol and Herceptin  To help prevent nausea and vomiting after your treatment, we encourage you to take your nausea medication as prescribed.   If you develop nausea and vomiting that is not controlled by your nausea medication, call the clinic.   BELOW ARE SYMPTOMS THAT SHOULD BE REPORTED IMMEDIATELY:  *FEVER GREATER THAN 100.5 F  *CHILLS WITH OR WITHOUT FEVER  NAUSEA AND VOMITING THAT IS NOT CONTROLLED WITH YOUR NAUSEA MEDICATION  *UNUSUAL SHORTNESS OF BREATH  *UNUSUAL BRUISING OR BLEEDING  TENDERNESS IN MOUTH AND THROAT WITH OR WITHOUT PRESENCE OF ULCERS  *URINARY PROBLEMS  *BOWEL PROBLEMS  UNUSUAL RASH Items with * indicate a potential emergency and should be followed up as soon as possible.  Feel free to call the clinic should you have any questions or concerns. The clinic phone number is [phone].  Please show the [facility] at check-in to the Emergency Department and triage nurse.  Trastuzumab injection for infusion(herceptin) What is this medicine? TRASTUZUMAB (tras TOO zoo mab) is a monoclonal antibody. It is used to treat breast cancer and stomach cancer. This medicine may be used for other purposes; ask your health care provider or pharmacist if you have questions. COMMON BRAND NAME(S): Herceptin What should I tell my health care provider before I take this medicine? They need to know if you have any of these conditions: -heart disease -heart failure -lung or breathing disease, like asthma -an unusual or allergic reaction to trastuzumab, benzyl alcohol, or other medications, foods, dyes, or preservatives -pregnant or trying to get pregnant -breast-feeding How should I use this medicine? This drug is given as an infusion into a vein. It is administered in a hospital or clinic by a specially trained health care  professional. Talk to your pediatrician regarding the use of this medicine in children. This medicine is not approved for use in children. Overdosage: If you think you have taken too much of this medicine contact a poison control center or emergency room at once. NOTE: This medicine is only for you. Do not share this medicine with others. What if I miss a dose? It is important not to miss a dose. Call your doctor or health care professional if you are unable to keep an appointment. What may interact with this medicine? This medicine may interact with the following medications: -certain types of chemotherapy, such as daunorubicin, doxorubicin, epirubicin, and idarubicin This list may not describe all possible interactions. Give your health care provider a list of all the medicines, herbs, non-prescription drugs, or dietary supplements you use. Also tell them if you smoke, drink alcohol, or use illegal drugs. Some items may interact with your medicine. What should I watch for while using this medicine? Visit your doctor for checks on your progress. Report any side effects. Continue your course of treatment even though you feel ill unless your doctor tells you to stop. Call your doctor or health care professional for advice if you get a fever, chills or sore throat, or other symptoms of a cold or flu. Do not treat yourself. Try to avoid being around people who are sick. You may experience fever, chills and shaking during your first infusion. These effects are usually mild and can be treated with other medicines. Report any side effects during the infusion to your health care professional. Fever and chills usually do not happen with later infusions. Do not become  pregnant while taking this medicine or for 7 months after stopping it. Women should inform their doctor if they wish to become pregnant or think they might be pregnant. Women of child-bearing potential will need to have a negative pregnancy test  before starting this medicine. There is a potential for serious side effects to an unborn child. Talk to your health care professional or pharmacist for more information. Do not breast-feed an infant while taking this medicine or for 7 months after stopping it. Women must use effective birth control with this medicine. What side effects may I notice from receiving this medicine? Side effects that you should report to your doctor or health care professional as soon as possible: -allergic reactions like skin rash, itching or hives, swelling of the face, lips, or tongue -chest pain or palpitations -cough -dizziness -feeling faint or lightheaded, falls -fever -general ill feeling or flu-like symptoms -signs of worsening heart failure like breathing problems; swelling in your legs and feet -unusually weak or tired Side effects that usually do not require medical attention (report to your doctor or health care professional if they continue or are bothersome): -bone pain -changes in taste -diarrhea -joint pain -nausea/vomiting -weight loss This list may not describe all possible side effects. Call your doctor for medical advice about side effects. You may report side effects to FDA at 1-800-FDA-1088. Where should I keep my medicine? This drug is given in a hospital or clinic and will not be stored at home. NOTE: This sheet is a summary. It may not cover all possible information. If you have questions about this medicine, talk to your doctor, pharmacist, or health care provider.  2018 Elsevier/Gold Standard (2016-06-21 14:37:52)   Paclitaxel injection(taxol) What is this medicine? PACLITAXEL (PAK li TAX el) is a chemotherapy drug. It targets fast dividing cells, like cancer cells, and causes these cells to die. This medicine is used to treat ovarian cancer, breast cancer, and other cancers. This medicine may be used for other purposes; ask your health care provider or pharmacist if you have  questions. COMMON BRAND NAME(S): Onxol, Taxol What should I tell my health care provider before I take this medicine? They need to know if you have any of these conditions: -blood disorders -irregular heartbeat -infection (especially a virus infection such as chickenpox, cold sores, or herpes) -liver disease -previous or ongoing radiation therapy -an unusual or allergic reaction to paclitaxel, alcohol, polyoxyethylated castor oil, other chemotherapy agents, other medicines, foods, dyes, or preservatives -pregnant or trying to get pregnant -breast-feeding How should I use this medicine? This drug is given as an infusion into a vein. It is administered in a hospital or clinic by a specially trained health care professional. Talk to your pediatrician regarding the use of this medicine in children. Special care may be needed. Overdosage: If you think you have taken too much of this medicine contact a poison control center or emergency room at once. NOTE: This medicine is only for you. Do not share this medicine with others. What if I miss a dose? It is important not to miss your dose. Call your doctor or health care professional if you are unable to keep an appointment. What may interact with this medicine? Do not take this medicine with any of the following medications: -disulfiram -metronidazole This medicine may also interact with the following medications: -cyclosporine -diazepam -ketoconazole -medicines to increase blood counts like filgrastim, pegfilgrastim, sargramostim -other chemotherapy drugs like cisplatin, doxorubicin, epirubicin, etoposide, teniposide, vincristine -quinidine -testosterone -vaccines -verapamil  Talk to your doctor or health care professional before taking any of these medicines: -acetaminophen -aspirin -ibuprofen -ketoprofen -naproxen This list may not describe all possible interactions. Give your health care provider a list of all the medicines, herbs,  non-prescription drugs, or dietary supplements you use. Also tell them if you smoke, drink alcohol, or use illegal drugs. Some items may interact with your medicine. What should I watch for while using this medicine? Your condition will be monitored carefully while you are receiving this medicine. You will need important blood work done while you are taking this medicine. This medicine can cause serious allergic reactions. To reduce your risk you will need to take other medicine(s) before treatment with this medicine. If you experience allergic reactions like skin rash, itching or hives, swelling of the face, lips, or tongue, tell your doctor or health care professional right away. In some cases, you may be given additional medicines to help with side effects. Follow all directions for their use. This drug may make you feel generally unwell. This is not uncommon, as chemotherapy can affect healthy cells as well as cancer cells. Report any side effects. Continue your course of treatment even though you feel ill unless your doctor tells you to stop. Call your doctor or health care professional for advice if you get a fever, chills or sore throat, or other symptoms of a cold or flu. Do not treat yourself. This drug decreases your body's ability to fight infections. Try to avoid being around people who are sick. This medicine may increase your risk to bruise or bleed. Call your doctor or health care professional if you notice any unusual bleeding. Be careful brushing and flossing your teeth or using a toothpick because you may get an infection or bleed more easily. If you have any dental work done, tell your dentist you are receiving this medicine. Avoid taking products that contain aspirin, acetaminophen, ibuprofen, naproxen, or ketoprofen unless instructed by your doctor. These medicines may hide a fever. Do not become pregnant while taking this medicine. Women should inform their doctor if they wish to  become pregnant or think they might be pregnant. There is a potential for serious side effects to an unborn child. Talk to your health care professional or pharmacist for more information. Do not breast-feed an infant while taking this medicine. Men are advised not to father a child while receiving this medicine. This product may contain alcohol. Ask your pharmacist or healthcare provider if this medicine contains alcohol. Be sure to tell all healthcare providers you are taking this medicine. Certain medicines, like metronidazole and disulfiram, can cause an unpleasant reaction when taken with alcohol. The reaction includes flushing, headache, nausea, vomiting, sweating, and increased thirst. The reaction can last from 30 minutes to several hours. What side effects may I notice from receiving this medicine? Side effects that you should report to your doctor or health care professional as soon as possible: -allergic reactions like skin rash, itching or hives, swelling of the face, lips, or tongue -low blood counts - This drug may decrease the number of white blood cells, red blood cells and platelets. You may be at increased risk for infections and bleeding. -signs of infection - fever or chills, cough, sore throat, pain or difficulty passing urine -signs of decreased platelets or bleeding - bruising, pinpoint red spots on the skin, black, tarry stools, nosebleeds -signs of decreased red blood cells - unusually weak or tired, fainting spells, lightheadedness -breathing problems -chest pain -high  or low blood pressure -mouth sores -nausea and vomiting -pain, swelling, redness or irritation at the injection site -pain, tingling, numbness in the hands or feet -slow or irregular heartbeat -swelling of the ankle, feet, hands Side effects that usually do not require medical attention (report to your doctor or health care professional if they continue or are bothersome): -bone pain -complete hair loss  including hair on your head, underarms, pubic hair, eyebrows, and eyelashes -changes in the color of fingernails -diarrhea -loosening of the fingernails -loss of appetite -muscle or joint pain -red flush to skin -sweating This list may not describe all possible side effects. Call your doctor for medical advice about side effects. You may report side effects to FDA at 1-800-FDA-1088. Where should I keep my medicine? This drug is given in a hospital or clinic and will not be stored at home. NOTE: This sheet is a summary. It may not cover all possible information. If you have questions about this medicine, talk to your doctor, pharmacist, or health care provider.  2018 Elsevier/Gold Standard (2015-04-28 19:58:00)   Nanoparticle Albumin-Bound Paclitaxel injection (Abraxane) What is this medicine? NANOPARTICLE ALBUMIN-BOUND PACLITAXEL (Na no PAHR ti kuhl al BYOO muhn-bound PAK li TAX el) is a chemotherapy drug. It targets fast dividing cells, like cancer cells, and causes these cells to die. This medicine is used to treat advanced breast cancer and advanced lung cancer. This medicine may be used for other purposes; ask your health care provider or pharmacist if you have questions. COMMON BRAND NAME(S): Abraxane What should I tell my health care provider before I take this medicine? They need to know if you have any of these conditions: -kidney disease -liver disease -low blood counts, like low platelets, red blood cells, or white blood cells -recent or ongoing radiation therapy -an unusual or allergic reaction to paclitaxel, albumin, other chemotherapy, other medicines, foods, dyes, or preservatives -pregnant or trying to get pregnant -breast-feeding How should I use this medicine? This drug is given as an infusion into a vein. It is administered in a hospital or clinic by a specially trained health care professional. Talk to your pediatrician regarding the use of this medicine in  children. Special care may be needed. Overdosage: If you think you have taken too much of this medicine contact a poison control center or emergency room at once. NOTE: This medicine is only for you. Do not share this medicine with others. What if I miss a dose? It is important not to miss your dose. Call your doctor or health care professional if you are unable to keep an appointment. What may interact with this medicine? -cyclosporine -diazepam -ketoconazole -medicines to increase blood counts like filgrastim, pegfilgrastim, sargramostim -other chemotherapy drugs like cisplatin, doxorubicin, epirubicin, etoposide, teniposide, vincristine -quinidine -testosterone -vaccines -verapamil Talk to your doctor or health care professional before taking any of these medicines: -acetaminophen -aspirin -ibuprofen -ketoprofen -naproxen This list may not describe all possible interactions. Give your health care provider a list of all the medicines, herbs, non-prescription drugs, or dietary supplements you use. Also tell them if you smoke, drink alcohol, or use illegal drugs. Some items may interact with your medicine. What should I watch for while using this medicine? Your condition will be monitored carefully while you are receiving this medicine. You will need important blood work done while you are taking this medicine. This medicine can cause serious allergic reactions. If you experience allergic reactions like skin rash, itching or hives, swelling of the face, lips,  or tongue, tell your doctor or health care professional right away. In some cases, you may be given additional medicines to help with side effects. Follow all directions for their use. This drug may make you feel generally unwell. This is not uncommon, as chemotherapy can affect healthy cells as well as cancer cells. Report any side effects. Continue your course of treatment even though you feel ill unless your doctor tells you to  stop. Call your doctor or health care professional for advice if you get a fever, chills or sore throat, or other symptoms of a cold or flu. Do not treat yourself. This drug decreases your body's ability to fight infections. Try to avoid being around people who are sick. This medicine may increase your risk to bruise or bleed. Call your doctor or health care professional if you notice any unusual bleeding. Be careful brushing and flossing your teeth or using a toothpick because you may get an infection or bleed more easily. If you have any dental work done, tell your dentist you are receiving this medicine. Avoid taking products that contain aspirin, acetaminophen, ibuprofen, naproxen, or ketoprofen unless instructed by your doctor. These medicines may hide a fever. Do not become pregnant while taking this medicine. Women should inform their doctor if they wish to become pregnant or think they might be pregnant. There is a potential for serious side effects to an unborn child. Talk to your health care professional or pharmacist for more information. Do not breast-feed an infant while taking this medicine. Men are advised not to father a child while receiving this medicine. What side effects may I notice from receiving this medicine? Side effects that you should report to your doctor or health care professional as soon as possible: -allergic reactions like skin rash, itching or hives, swelling of the face, lips, or tongue -low blood counts - This drug may decrease the number of white blood cells, red blood cells and platelets. You may be at increased risk for infections and bleeding. -signs of infection - fever or chills, cough, sore throat, pain or difficulty passing urine -signs of decreased platelets or bleeding - bruising, pinpoint red spots on the skin, black, tarry stools, nosebleeds -signs of decreased red blood cells - unusually weak or tired, fainting spells, lightheadedness -breathing  problems -changes in vision -chest pain -high or low blood pressure -mouth sores -nausea and vomiting -pain, swelling, redness or irritation at the injection site -pain, tingling, numbness in the hands or feet -slow or irregular heartbeat -swelling of the ankle, feet, hands Side effects that usually do not require medical attention (report to your doctor or health care professional if they continue or are bothersome): -aches, pains -changes in the color of fingernails -diarrhea -hair loss -loss of appetite This list may not describe all possible side effects. Call your doctor for medical advice about side effects. You may report side effects to FDA at 1-800-FDA-1088. Where should I keep my medicine? This drug is given in a hospital or clinic and will not be stored at home. NOTE: This sheet is a summary. It may not cover all possible information. If you have questions about this medicine, talk to your doctor, pharmacist, or health care provider.  2018 Elsevier/Gold Standard (2015-04-29 10:05:20)

## 2018-04-27 NOTE — Progress Notes (Unsigned)
Anna Valentine was not able to tolerate Taxol today and we were not able to move forward with the Herceptin.  I am changing her to Abraxane with the next treatment and we will start Herceptin at that time.  The orders have been written.

## 2018-04-27 NOTE — Progress Notes (Signed)
Per Dr Jana Hakim ok to tx today using nuclear stress test EF 75%  dated 12/20/2017.  @1226  during 1st 15 min of Taxol infusion pt c/o "pain in R shoulder", "just not feeling right" face and neck were flushed, pt quickly "could not breathe" eyes were tearing. Taxol infusion stopped, NS hung to gravity, Hypersensitivity protocol started Dr Jana Hakim called to tx area. Epi Pen administered 1st, solumedrol 125 also administered according to [facility]. VS charted as listed in flowsheets.  @1230  Dr Lindi Adie arrived in tx area, @1234  Dr Jana Hakim arrived to tx area   Per Dr Jana Hakim ok to re-challenge pt in @30  min.   Per Dr Jana Hakim to keep Taxol infusion at 1st rate of 54mL/hr for "what we can get in today". Per Dr Jana Hakim pt will be receiving Abraxane instead for next tx.   Per Dr Jana Hakim ok to stop Taxol infusion at 3:30pm today pt to receive Abraxane next infusion instead.   VSS at d/c

## 2018-05-01 ENCOUNTER — Ambulatory Visit: Payer: Medicare Other

## 2018-05-01 DIAGNOSIS — Z483 Aftercare following surgery for neoplasm: Secondary | ICD-10-CM

## 2018-05-01 DIAGNOSIS — R222 Localized swelling, mass and lump, trunk: Secondary | ICD-10-CM | POA: Diagnosis not present

## 2018-05-01 DIAGNOSIS — M25612 Stiffness of left shoulder, not elsewhere classified: Secondary | ICD-10-CM | POA: Diagnosis not present

## 2018-05-01 DIAGNOSIS — M25611 Stiffness of right shoulder, not elsewhere classified: Secondary | ICD-10-CM

## 2018-05-01 NOTE — Therapy (Signed)
[place], [place], [address] Phone: [phone]   Fax:  [phone]  Physical Therapy Treatment  Patient Details  Name: Anna Valentine MRN: [id number] Date of Birth: December 27, 1950 Referring Provider (PT): Dr. Donne Hazel    Encounter Date: 05/01/2018  PT End of Session - 05/01/18 1107    Visit Number  8    Number of Visits  9    Date for PT Re-Evaluation  05/04/18    PT Start Time  1018    PT Stop Time  1105    PT Time Calculation (min)  47 min    Activity Tolerance  Patient tolerated treatment well    Behavior During Therapy  [facility] for tasks assessed/performed       Past Medical History:  Diagnosis Date  . Anemia   . BRCA negative 2011   BRCA I/ II negative  . Breast cancer ([place]) 08/2009   stage 2, rx with lumpectomy and xrt  . COPD (chronic obstructive pulmonary disease) (HCC)    pt.unsure of diagnosis status  . Emphysema of lung ([place])    pt. questions diagnosis  . Family history of adverse reaction to anesthesia    My Sister has nausea  . Family history of breast cancer   . Family history of colon cancer   . Family history of lung cancer   . GERD (gastroesophageal reflux disease)    not presently having symptoms  . Lung cancer ([place]) 06/06/05   stage 1 poorly differentiated adenocarcinoma, s/p right lower lobectomy.  . Osteoporosis   . Personal history of lung cancer   . STD (sexually transmitted disease)    HSV    Past Surgical History:  Procedure Laterality Date  . BREAST BIOPSY    . BREAST LUMPECTOMY  10/12/2009   Left lumpectomy and radiation, stage II, ER/PR+, Her 2 nu negative  . BUNIONECTOMY    . COLONOSCOPY    . LOBECTOMY  06/06/2005   RLL  . MASTECTOMY W/ SENTINEL NODE BIOPSY Bilateral 03/15/2018  . MASTECTOMY W/ SENTINEL NODE BIOPSY Bilateral 03/15/2018   Procedure: BILATERAL TOTAL MASTECTOMIES WITH RIGHT SENTINEL LYMPH NODE BIOPSY;  Surgeon: Rolm Bookbinder, MD;   Location: [facility];  Service: General;  Laterality: Bilateral;  . PORTACATH PLACEMENT N/A 03/15/2018   Procedure: INSERTION PORT-A-CATH WITH [place];  Surgeon: Rolm Bookbinder, MD;  Location: [facility];  Service: General;  Laterality: N/A;  . TONSILLECTOMY    . TUBAL LIGATION  1984    There were no vitals filed for this visit.  Subjective Assessment - 05/01/18 1021    Subjective  I had my first chemo Friday and I ended up going into anaphylactic shock after a few mins and I couldn't breathe. So they are going to change what they give me next time. I had some dull, achy pain across the back of my shoulders yesterday I think from the meds but I feel better today.     Pertinent History   New right breast cancer diagnosed on 01/25/2018.  She underwent a bilateral mastectomy wit 6 lymph nodes removed on the right on 03/15/2018. 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  She also had a port placed on the right side at that time.   Pt had previous left breast cancer 2008 with sentinel node removal and radiation, lung cancer in 2006 She has history of frozen shoulder on the right that has resolved and the start  of frozen shoulder on the left that resolved with radiation.  She has LFS .    Patient Stated Goals  to keep a good posture and get rid of the pain     Currently in Pain?  No/denies                       [facility] Adult PT Treatment/Exercise - 05/01/18 0001      Shoulder Exercises: Standing   Other Standing Exercises  Bil UE 3 way raises with 1 lb with VCs and demonstration for correc technique x10 each with back against wall/core engaged, nad head/shoulders against wall      Shoulder Exercises: Therapy Ball   Flexion  Both;10 reps   1 lb on wrist; forward lean into end of stretch   Flexion Limitations  Also practiced this in doorway to replicate what pt will       Manual Therapy   Myofascial Release  To axilla, chest and incision supine to tolerance  bilaterally, left scar more tight than right and more tender    Passive ROM  In Supine to bil shoulders into flexion, abduction and D2 to pts tolerance    Neural Stretch  To right UE in supine                  PT Long Term Goals - 04/24/18 1150      PT LONG TERM GOAL #1   Title  Pt will report pain is reduced by 50%    Baseline  Pt reports pain mcu improved but did not rate today-04/24/18    Status  Partially Met      PT LONG TERM GOAL #2   Title  Pt will have 150 degrees of bilateral shoulder abduction so that she can return to all household activities at home     Baseline  Rt 155 and Lt 149 degrees-04/24/18    Status  Partially Met      PT LONG TERM GOAL #3   Title  Pt will reduce quick DASH score to < 10 indicating a functional improvment of UEs    Baseline  27.27 on 04/04/2018    Status  On-going      PT LONG TERM GOAL #4   Title  Pt will vebalize understanding of lymphedema risk reduction practices     Baseline  Pt attended ABC class and has no further questions regarding lymphedmea risk reduction-04/24/18    Status  Achieved            Plan - 05/01/18 1149    Clinical Impression Statement  Continued with end P/ROM stretching and manual therapy at bil incisions. Pt is limited Lt>Rt now with end ROM stretching and when she performs AA/ROM reports noting more tightness at Lt as well. She did well with weights for ball up wall and also with progression to standing 3 way raises. Pt will benefit from a few more sessions of therapy to finalize her HEP and to help her attain increased end ROM.     Rehab Potential  Excellent    Clinical Impairments Affecting Rehab Potential  recent surgery    PT Frequency  2x / week    PT Duration  4 weeks    PT Treatment/Interventions  ADLs/Self Care Home Management;Manual techniques;Orthotic Fit/Training;Patient/family education;Manual lymph drainage;Compression bandaging;Scar mobilization;Passive range of motion    PT Next Visit  Plan  Remeasure A/ROM, cont end ROM stretching Lt>Rt shoulders, and finalize HEP working towards  D/C in next few sessions.Marland KitchenMarland KitchenStrength ABC Program??    Consulted and Agree with Plan of Care  Patient       Patient will benefit from skilled therapeutic intervention in order to improve the following deficits and impairments:  Pain, Impaired sensation, Increased fascial restricitons, Decreased scar mobility, Postural dysfunction, Decreased range of motion, Decreased strength, Impaired UE functional use, Increased edema  Visit Diagnosis: Stiffness of right shoulder, not elsewhere classified  Stiffness of left shoulder, not elsewhere classified  Aftercare following surgery for neoplasm  Localized swelling, mass and lump, trunk     Problem List Patient Active Problem List   Diagnosis Date Noted  . Port-A-Cath in place 04/26/2018  . Breast cancer, right ([place]) 03/15/2018  . Genetic testing 02/20/2018  . Family history of breast cancer   . Family history of lung cancer   . Personal history of lung cancer   . Chest pain 11/28/2017  . Abnormal CT of the chest 11/30/2016  . Malignant neoplasm of upper-inner quadrant of left breast in female, estrogen receptor positive ([place]) 06/16/2014  . Osteopenia 09/13/2013  . Postmenopausal atrophic vaginitis 09/13/2013  . Unspecified vitamin D deficiency 09/13/2013  . Malignant neoplasm of lower lobe of right lung ([place]) 07/24/2012  . History of lung cancer 03/11/2011  . Anxiety 03/11/2011  . History of neutropenia 03/11/2011  . Family history of colon cancer 03/11/2011  . Cough 11/15/2010  . Bronchiectasis without acute exacerbation ([place]) 11/15/2010    Otelia Limes, PTA 05/01/2018, 12:17 PM  [place], [place], [address] Phone: [phone]   Fax:  [phone]  Name: Anna Valentine MRN: [id number] Date of Birth: [birth year]

## 2018-05-03 ENCOUNTER — Inpatient Hospital Stay: Payer: Medicare Other

## 2018-05-03 ENCOUNTER — Ambulatory Visit: Payer: Medicare Other

## 2018-05-03 ENCOUNTER — Inpatient Hospital Stay (HOSPITAL_BASED_OUTPATIENT_CLINIC_OR_DEPARTMENT_OTHER): Payer: Medicare Other | Admitting: Oncology

## 2018-05-03 VITALS — BP 161/83 | HR 81 | Temp 97.4°F | Resp 18 | Ht 66.0 in | Wt 115.4 lb

## 2018-05-03 DIAGNOSIS — Z79899 Other long term (current) drug therapy: Secondary | ICD-10-CM

## 2018-05-03 DIAGNOSIS — M25612 Stiffness of left shoulder, not elsewhere classified: Secondary | ICD-10-CM | POA: Diagnosis not present

## 2018-05-03 DIAGNOSIS — C50212 Malignant neoplasm of upper-inner quadrant of left female breast: Secondary | ICD-10-CM

## 2018-05-03 DIAGNOSIS — Z87891 Personal history of nicotine dependence: Secondary | ICD-10-CM | POA: Diagnosis not present

## 2018-05-03 DIAGNOSIS — R222 Localized swelling, mass and lump, trunk: Secondary | ICD-10-CM | POA: Diagnosis not present

## 2018-05-03 DIAGNOSIS — Z923 Personal history of irradiation: Secondary | ICD-10-CM

## 2018-05-03 DIAGNOSIS — Z85118 Personal history of other malignant neoplasm of bronchus and lung: Secondary | ICD-10-CM

## 2018-05-03 DIAGNOSIS — Z483 Aftercare following surgery for neoplasm: Secondary | ICD-10-CM

## 2018-05-03 DIAGNOSIS — Z170 Estrogen receptor positive status [ER+]: Secondary | ICD-10-CM

## 2018-05-03 DIAGNOSIS — Z5111 Encounter for antineoplastic chemotherapy: Secondary | ICD-10-CM | POA: Diagnosis not present

## 2018-05-03 DIAGNOSIS — R203 Hyperesthesia: Secondary | ICD-10-CM | POA: Diagnosis not present

## 2018-05-03 DIAGNOSIS — M25611 Stiffness of right shoulder, not elsewhere classified: Secondary | ICD-10-CM | POA: Diagnosis not present

## 2018-05-03 DIAGNOSIS — C3431 Malignant neoplasm of lower lobe, right bronchus or lung: Secondary | ICD-10-CM

## 2018-05-03 DIAGNOSIS — Z95828 Presence of other vascular implants and grafts: Secondary | ICD-10-CM

## 2018-05-03 DIAGNOSIS — Z5112 Encounter for antineoplastic immunotherapy: Secondary | ICD-10-CM | POA: Diagnosis not present

## 2018-05-03 LAB — CBC WITH DIFFERENTIAL/PLATELET
Abs Immature Granulocytes: 0.03 10*3/uL (ref 0.00–0.07)
BASOS PCT: 1 %
Basophils Absolute: 0 10*3/uL (ref 0.0–0.1)
EOS ABS: 0.1 10*3/uL (ref 0.0–0.5)
EOS PCT: 2 %
HCT: 33 % — ABNORMAL LOW (ref 36.0–46.0)
Hemoglobin: 10.5 g/dL — ABNORMAL LOW (ref 12.0–15.0)
Immature Granulocytes: 1 %
Lymphocytes Relative: 24 %
Lymphs Abs: 0.8 10*3/uL (ref 0.7–4.0)
MCH: 30 pg (ref 26.0–34.0)
MCHC: 31.8 g/dL (ref 30.0–36.0)
MCV: 94.3 fL (ref 80.0–100.0)
MONO ABS: 0.4 10*3/uL (ref 0.1–1.0)
Monocytes Relative: 11 %
Neutro Abs: 2.1 10*3/uL (ref 1.7–7.7)
Neutrophils Relative %: 61 %
PLATELETS: 252 10*3/uL (ref 150–400)
RBC: 3.5 MIL/uL — AB (ref 3.87–5.11)
RDW: 13.4 % (ref 11.5–15.5)
WBC: 3.3 10*3/uL — ABNORMAL LOW (ref 4.0–10.5)
nRBC: 0 % (ref 0.0–0.2)

## 2018-05-03 LAB — COMPREHENSIVE METABOLIC PANEL
ALT: 19 U/L (ref 0–44)
AST: 22 U/L (ref 15–41)
Albumin: 3.7 g/dL (ref 3.5–5.0)
Alkaline Phosphatase: 87 U/L (ref 38–126)
Anion gap: 10 (ref 5–15)
BUN: 14 mg/dL (ref 8–23)
CO2: 25 mmol/L (ref 22–32)
Calcium: 9.3 mg/dL (ref 8.9–10.3)
Chloride: 104 mmol/L (ref 98–111)
Creatinine, Ser: 0.64 mg/dL (ref 0.44–1.00)
GFR calc non Af Amer: >60 mL/min (ref >60)
Glucose, Bld: 81 mg/dL (ref 70–99)
Potassium: 4 mmol/L (ref 3.5–5.1)
SODIUM: 139 mmol/L (ref 135–145)
Total Bilirubin: 0.6 mg/dL (ref 0.3–1.2)
Total Protein: 6.9 g/dL (ref 6.5–8.1)

## 2018-05-03 MED ORDER — PROCHLORPERAZINE MALEATE 10 MG PO TABS
ORAL_TABLET | ORAL | Status: AC
  Filled 2018-05-03: qty 1

## 2018-05-03 MED ORDER — ACETAMINOPHEN 325 MG PO TABS
ORAL_TABLET | ORAL | Status: AC
  Filled 2018-05-03: qty 2

## 2018-05-03 MED ORDER — DIPHENHYDRAMINE HCL 25 MG PO CAPS
25.0000 mg | ORAL_CAPSULE | Freq: Once | ORAL | Status: AC
  Administered 2018-05-03: 25 mg via ORAL

## 2018-05-03 MED ORDER — SODIUM CHLORIDE 0.9% FLUSH
10.0000 mL | INTRAVENOUS | Status: DC | PRN
  Administered 2018-05-03: 10 mL
  Filled 2018-05-03: qty 10

## 2018-05-03 MED ORDER — DIPHENHYDRAMINE HCL 25 MG PO CAPS
ORAL_CAPSULE | ORAL | Status: AC
  Filled 2018-05-03: qty 1

## 2018-05-03 MED ORDER — PROCHLORPERAZINE MALEATE 10 MG PO TABS
10.0000 mg | ORAL_TABLET | Freq: Once | ORAL | Status: AC
  Administered 2018-05-03: 10 mg via ORAL

## 2018-05-03 MED ORDER — SODIUM CHLORIDE 0.9 % IV SOLN
Freq: Once | INTRAVENOUS | Status: AC
  Administered 2018-05-03: 14:00:00 via INTRAVENOUS
  Filled 2018-05-03: qty 250

## 2018-05-03 MED ORDER — HEPARIN SOD (PORK) LOCK FLUSH 100 UNIT/ML IV SOLN
500.0000 [IU] | Freq: Once | INTRAVENOUS | Status: AC | PRN
  Administered 2018-05-03: 500 [IU]
  Filled 2018-05-03: qty 5

## 2018-05-03 MED ORDER — ACETAMINOPHEN 325 MG PO TABS
650.0000 mg | ORAL_TABLET | Freq: Once | ORAL | Status: AC
  Administered 2018-05-03: 650 mg via ORAL

## 2018-05-03 MED ORDER — PACLITAXEL PROTEIN-BOUND CHEMO INJECTION 100 MG
100.0000 mg/m2 | Freq: Once | INTRAVENOUS | Status: AC
  Administered 2018-05-03: 150 mg via INTRAVENOUS
  Filled 2018-05-03: qty 30

## 2018-05-03 MED ORDER — SODIUM CHLORIDE 0.9% FLUSH
10.0000 mL | Freq: Once | INTRAVENOUS | Status: AC
  Administered 2018-05-03: 10 mL
  Filled 2018-05-03: qty 10

## 2018-05-03 MED ORDER — TRASTUZUMAB CHEMO 150 MG IV SOLR
2.0000 mg/kg | Freq: Once | INTRAVENOUS | Status: AC
  Administered 2018-05-03: 105 mg via INTRAVENOUS
  Filled 2018-05-03: qty 5

## 2018-05-03 NOTE — Patient Instructions (Signed)
3 Way Raises:      Starting Position:  Leaning against wall, walk feet a few inches away from the wall and make tummy tight (tuck hips underneath you) Press back/shoulders/head against wall as much as possible. Keep thumbs up to ceiling, elbows straight and shoulders relaxed/down throughout.  1. Lift arms in front to shoulder height 2. Lift arms a little wider into a "V" to shoulder height 3. Lift arms out to sides in a "T" to shoulder height  Perform 10 times in each direction. Hold 1-2 lbs to start with and work up to 2-3 sets of 10/day. Perform 3-4 times/week. Increase weight as able, decreasing to 1 set of 10 each time when you increase weights, then slowly working your way back up to 2-3 sets each time.     Can also lay on floor over towel roll along mid spine between shoulder blades. Then "snow angel" arms until stretch at chest felt. Hold 2-5 mins Then can open/close arms at chest level 10 times.  Can also make a "V" with hands starting together over groin and then opening overhead 10 times   Cancer Rehab [phone]

## 2018-05-03 NOTE — Patient Instructions (Addendum)
[facility] Discharge Instructions for Patients Receiving Chemotherapy  Today you received the following chemotherapy agents:  Herceptin and Abraxane.  To help prevent nausea and vomiting after your treatment, we encourage you to take your nausea medication as directed.   If you develop nausea and vomiting that is not controlled by your nausea medication, call the clinic.   BELOW ARE SYMPTOMS THAT SHOULD BE REPORTED IMMEDIATELY:  *FEVER GREATER THAN 100.5 F  *CHILLS WITH OR WITHOUT FEVER  NAUSEA AND VOMITING THAT IS NOT CONTROLLED WITH YOUR NAUSEA MEDICATION  *UNUSUAL SHORTNESS OF BREATH  *UNUSUAL BRUISING OR BLEEDING  TENDERNESS IN MOUTH AND THROAT WITH OR WITHOUT PRESENCE OF ULCERS  *URINARY PROBLEMS  *BOWEL PROBLEMS  UNUSUAL RASH Items with * indicate a potential emergency and should be followed up as soon as possible.  Feel free to call the clinic should you have any questions or concerns. The clinic phone number is [phone].  Please show the [facility] at check-in to the Emergency Department and triage nurse.  Nanoparticle Albumin-Bound Paclitaxel injection What is this medicine? NANOPARTICLE ALBUMIN-BOUND PACLITAXEL (Na no PAHR ti kuhl al BYOO muhn-bound PAK li TAX el) is a chemotherapy drug. It targets fast dividing cells, like cancer cells, and causes these cells to die. This medicine is used to treat advanced breast cancer and advanced lung cancer. This medicine may be used for other purposes; ask your health care provider or pharmacist if you have questions. COMMON BRAND NAME(S): Abraxane What should I tell my health care provider before I take this medicine? They need to know if you have any of these conditions: -kidney disease -liver disease -low blood counts, like low platelets, red blood cells, or white blood cells -recent or ongoing radiation therapy -an unusual or allergic reaction to paclitaxel, albumin, other chemotherapy, other  medicines, foods, dyes, or preservatives -pregnant or trying to get pregnant -breast-feeding How should I use this medicine? This drug is given as an infusion into a vein. It is administered in a hospital or clinic by a specially trained health care professional. Talk to your pediatrician regarding the use of this medicine in children. Special care may be needed. Overdosage: If you think you have taken too much of this medicine contact a poison control center or emergency room at once. NOTE: This medicine is only for you. Do not share this medicine with others. What if I miss a dose? It is important not to miss your dose. Call your doctor or health care professional if you are unable to keep an appointment. What may interact with this medicine? -cyclosporine -diazepam -ketoconazole -medicines to increase blood counts like filgrastim, pegfilgrastim, sargramostim -other chemotherapy drugs like cisplatin, doxorubicin, epirubicin, etoposide, teniposide, vincristine -quinidine -testosterone -vaccines -verapamil Talk to your doctor or health care professional before taking any of these medicines: -acetaminophen -aspirin -ibuprofen -ketoprofen -naproxen This list may not describe all possible interactions. Give your health care provider a list of all the medicines, herbs, non-prescription drugs, or dietary supplements you use. Also tell them if you smoke, drink alcohol, or use illegal drugs. Some items may interact with your medicine. What should I watch for while using this medicine? Your condition will be monitored carefully while you are receiving this medicine. You will need important blood work done while you are taking this medicine. This medicine can cause serious allergic reactions. If you experience allergic reactions like skin rash, itching or hives, swelling of the face, lips, or tongue, tell your doctor or  health care professional right away. In some cases, you may be given  additional medicines to help with side effects. Follow all directions for their use. This drug may make you feel generally unwell. This is not uncommon, as chemotherapy can affect healthy cells as well as cancer cells. Report any side effects. Continue your course of treatment even though you feel ill unless your doctor tells you to stop. Call your doctor or health care professional for advice if you get a fever, chills or sore throat, or other symptoms of a cold or flu. Do not treat yourself. This drug decreases your body's ability to fight infections. Try to avoid being around people who are sick. This medicine may increase your risk to bruise or bleed. Call your doctor or health care professional if you notice any unusual bleeding. Be careful brushing and flossing your teeth or using a toothpick because you may get an infection or bleed more easily. If you have any dental work done, tell your dentist you are receiving this medicine. Avoid taking products that contain aspirin, acetaminophen, ibuprofen, naproxen, or ketoprofen unless instructed by your doctor. These medicines may hide a fever. Do not become pregnant while taking this medicine. Women should inform their doctor if they wish to become pregnant or think they might be pregnant. There is a potential for serious side effects to an unborn child. Talk to your health care professional or pharmacist for more information. Do not breast-feed an infant while taking this medicine. Men are advised not to father a child while receiving this medicine. What side effects may I notice from receiving this medicine? Side effects that you should report to your doctor or health care professional as soon as possible: -allergic reactions like skin rash, itching or hives, swelling of the face, lips, or tongue -low blood counts - This drug may decrease the number of white blood cells, red blood cells and platelets. You may be at increased risk for infections and  bleeding. -signs of infection - fever or chills, cough, sore throat, pain or difficulty passing urine -signs of decreased platelets or bleeding - bruising, pinpoint red spots on the skin, black, tarry stools, nosebleeds -signs of decreased red blood cells - unusually weak or tired, fainting spells, lightheadedness -breathing problems -changes in vision -chest pain -high or low blood pressure -mouth sores -nausea and vomiting -pain, swelling, redness or irritation at the injection site -pain, tingling, numbness in the hands or feet -slow or irregular heartbeat -swelling of the ankle, feet, hands Side effects that usually do not require medical attention (report to your doctor or health care professional if they continue or are bothersome): -aches, pains -changes in the color of fingernails -diarrhea -hair loss -loss of appetite This list may not describe all possible side effects. Call your doctor for medical advice about side effects. You may report side effects to FDA at 1-800-FDA-1088. Where should I keep my medicine? This drug is given in a hospital or clinic and will not be stored at home. NOTE: This sheet is a summary. It may not cover all possible information. If you have questions about this medicine, talk to your doctor, pharmacist, or health care provider.  2018 Elsevier/Gold Standard (2015-04-29 10:05:20)

## 2018-05-03 NOTE — Therapy (Signed)
[place], [place], [address] Phone: [phone]   Fax:  [phone]  Physical Therapy Treatment  Patient Details  Name: Anna Valentine MRN: [id number] Date of Birth: Oct 11, 1950 Referring Provider (PT): Dr. Donne Hazel    Encounter Date: 05/03/2018  PT End of Session - 05/03/18 1012    Visit Number  9    Number of Visits  9    Date for PT Re-Evaluation  05/04/18    PT Start Time  0852    PT Stop Time  0936    PT Time Calculation (min)  44 min    Activity Tolerance  Patient tolerated treatment well    Behavior During Therapy  [facility] for tasks assessed/performed       Past Medical History:  Diagnosis Date  . Anemia   . BRCA negative 2011   BRCA I/ II negative  . Breast cancer ([place]) 08/2009   stage 2, rx with lumpectomy and xrt  . COPD (chronic obstructive pulmonary disease) (H[facility])    pt.unsure of diagnosis status  . Emphysema of lung ([place])    pt. questions diagnosis  . Family history of adverse reaction to anesthesia    My Sister has nausea  . Family history of breast cancer   . Family history of colon cancer   . Family history of lung cancer   . GERD (gastroesophageal reflux disease)    not presently having symptoms  . Lung cancer ([place]) 06/06/05   stage 1 poorly differentiated adenocarcinoma, s/p right lower lobectomy.  . Osteoporosis   . Personal history of lung cancer   . STD (sexually transmitted disease)    HSV    Past Surgical History:  Procedure Laterality Date  . BREAST BIOPSY    . BREAST LUMPECTOMY  10/12/2009   Left lumpectomy and radiation, stage II, ER/PR+, Her 2 nu negative  . BUNIONECTOMY    . COLONOSCOPY    . LOBECTOMY  06/06/2005   RLL  . MASTECTOMY W/ SENTINEL NODE BIOPSY Bilateral 03/15/2018  . MASTECTOMY W/ SENTINEL NODE BIOPSY Bilateral 03/15/2018   Procedure: BILATERAL TOTAL MASTECTOMIES WITH RIGHT SENTINEL LYMPH NODE BIOPSY;  Surgeon: Rolm Bookbinder, MD;   Location: [facility];  Service: General;  Laterality: Bilateral;  . PORTACATH PLACEMENT N/A 03/15/2018   Procedure: INSERTION PORT-A-CATH WITH [place];  Surgeon: Rolm Bookbinder, MD;  Location: [facility];  Service: General;  Laterality: N/A;  . TONSILLECTOMY    . TUBAL LIGATION  1984    There were no vitals filed for this visit.  Subjective Assessment - 05/03/18 0855    Subjective  My back fels all better today, I think it was just a side effecgt from the chemo. I want to review the 3 way raises we did last time.     Pertinent History   New right breast cancer diagnosed on 01/25/2018.  She underwent a bilateral mastectomy wit 6 lymph nodes removed on the right on 03/15/2018. 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  She also had a port placed on the right side at that time.   Pt had previous left breast cancer 2008 with sentinel node removal and radiation, lung cancer in 2006 She has history of frozen shoulder on the right that has resolved and the start of frozen shoulder on the left that resolved with radiation.  She has LFS .    Patient Stated Goals  to keep a good posture and  get rid of the pain     Currently in Pain?  No/denies                       [facility] Adult PT Treatment/Exercise - 05/03/18 0001      Shoulder Exercises: Standing   Other Standing Exercises  Bil UE 3 way raises with 1 lb with VCs and demonstration for correc technique x10 each with back against wall/core engaged, and head/shoulders against wall, demo for correct UE positioning throughout today    Other Standing Exercises  Wall Push Ups 10x with demo to keep elbows adducted to decrease scapular strain, then instructed how to progress incline to progress difficulty; Neural tension stretch on wall with flossing bil UE 10 times each side      Shoulder Exercises: Therapy Ball   Flexion  Both;10 reps   1 lb on each wrist; forward lean into end of stretch   ABduction  Right;Left;10  reps   1 lb each wrist; same side lean into end of stretch   ABduction Limitations  VCs to decrease scapular compensation and engage core to decrease strain pt reported feeling in low back      Manual Therapy   Myofascial Release  To axilla, chest and incision supine to tolerance bilaterally    Passive ROM  In Supine to bil shoulders into flexion, abduction and D2 to pts tolerance and pt over towel roll along T-spine between scapulae    Neural Stretch  To right UE in supine             PT Education - 05/03/18 1011    Education Details  Standing bil UE 3 way raises and supine over towel roll for pect stretches    Person(s) Educated  Patient    Methods  Explanation;Demonstration;Handout    Comprehension  Verbalized understanding;Returned demonstration          PT Long Term Goals - 05/03/18 0856      PT LONG TERM GOAL #1   Title  Pt will report pain is reduced by 50%    Baseline  Pt reports pain mcu improved but did not rate today-04/24/18; 95% improvement-05/03/18    Status  Achieved      PT LONG TERM GOAL #2   Title  Pt will have 150 degrees of bilateral shoulder abduction so that she can return to all household activities at home     Baseline  Rt 155 and Lt 149 degrees-04/24/18; Rt 164 and Lt 160 degrees-05/03/18    Status  Achieved      PT LONG TERM GOAL #4   Title  Pt will vebalize understanding of lymphedema risk reduction practices     Baseline  Pt attended ABC class and has no further questions regarding lymphedmea risk reduction-04/24/18    Status  Achieved            Plan - 05/03/18 1012    Clinical Impression Statement  Practiced bil UE 3 way raises again in gym and pt performed these with good technique so added to HEP along with supine over towel roll. Overall pt has progressed very well with goals and met most at this time. Her biggest issue is still very tight at end Bil UE ROM so discussed further therapy with pt and she would like to continue 1x/wk  for 4 weeks to focus on regaining improved end ROM and decreasing her pect and axillary tightness, and progressing HEP prn.  Rehab Potential  Excellent    Clinical Impairments Affecting Rehab Potential  recent surgery    PT Frequency  2x / week    PT Duration  4 weeks    PT Treatment/Interventions  ADLs/Self Care Home Management;Manual techniques;Orthotic Fit/Training;Patient/family education;Manual lymph drainage;Compression bandaging;Scar mobilization;Passive range of motion    PT Next Visit Plan  Renew at next visit for 1x/wk focusing on end ROM and progressing HEP prn.     Consulted and Agree with Plan of Care  Patient       Patient will benefit from skilled therapeutic intervention in order to improve the following deficits and impairments:  Pain, Impaired sensation, Increased fascial restricitons, Decreased scar mobility, Postural dysfunction, Decreased range of motion, Decreased strength, Impaired UE functional use, Increased edema  Visit Diagnosis: Stiffness of right shoulder, not elsewhere classified  Stiffness of left shoulder, not elsewhere classified  Aftercare following surgery for neoplasm  Localized swelling, mass and lump, trunk     Problem List Patient Active Problem List   Diagnosis Date Noted  . Port-A-Cath in place 04/26/2018  . Breast cancer, right ([place]) 03/15/2018  . Genetic testing 02/20/2018  . Family history of breast cancer   . Family history of lung cancer   . Personal history of lung cancer   . Chest pain 11/28/2017  . Abnormal CT of the chest 11/30/2016  . Malignant neoplasm of upper-inner quadrant of left breast in female, estrogen receptor positive ([place]) 06/16/2014  . Osteopenia 09/13/2013  . Postmenopausal atrophic vaginitis 09/13/2013  . Unspecified vitamin D deficiency 09/13/2013  . Malignant neoplasm of lower lobe of right lung ([place]) 07/24/2012  . History of lung cancer 03/11/2011  . Anxiety 03/11/2011  . History of neutropenia  03/11/2011  . Family history of colon cancer 03/11/2011  . Cough 11/15/2010  . Bronchiectasis without acute exacerbation ([place]) 11/15/2010    Otelia Limes, PTA 05/03/2018, 10:19 AM  [place], [place], [address] Phone: [phone]   Fax:  [phone]  Name: Anna Valentine MRN: [id number] Date of Birth: 1951/03/22

## 2018-05-03 NOTE — Progress Notes (Signed)
Anna Valentine  [phone] [phone]     ID: Anna Valentine DOB: Nov 13, 1950  MR#: [id number]  PYP#:[id number]  Patient Care Team: [name], MD as PCP - General (Internal Medicine) Tyquisha Sharps, Virgie Dad, MD as Consulting Physician (Oncology) Collene Gobble, MD as Consulting Physician (Pulmonary Disease) [name], MD as Referring Physician Armbruster, Carlota Raspberry, MD as Consulting Physician (Gastroenterology) Kem Boroughs, [place] (Family Medicine) [name], MD as Consulting Physician (Radiation Oncology) Rolm Bookbinder, MD as Consulting Physician (General Surgery)  CHIEF COMPLAINT:  (1) Estrogen receptor positive left-sided breast cancer    (2) history of stage IA right lung adenocarcinoma resected November 2006    (3) triple- positive right-sided breast cancer   CURRENT TREATMENT: Adjuvant chemo/immunotherapy  INTERVAL HISTORY: Anna Valentine returns today for follow-up of her new triple-positive breast cancer accompanied by her husband.  Today is day 1 cycle 2 of 12 planned cycles of Taxol and Herceptin.  She had a reaction to Taxol last week so we have changed her treatment to Abraxane.  She had minimal nausea on days 2 and 3 posttreatment, with no vomiting.   REVIEW OF SYSTEMS: Anna Valentine did well with her treatment after leaving the cancer center last week.  She had a little pain in the upper back, but she also was exercising.  She has a little bit discomfort associated with the left chest wall scar again only noticeable when she exercises.  The area of minimal dehiscence on the right is almost completely resolved.  There have been no fevers, rash, headaches, visual changes, nausea, vomiting, cough, phlegm production, pleurisy, or change in bowel or bladder habits.  A detailed review of systems today was otherwise stable.   LUNG CANCER HISTORY:  From Dr. [name] original intake note 07/26/2005: (Lung cancer presentation)  "The  patient is a very pleasant 67 year old female without a significant past medical history, who states that in 04/2005 she felt a lump in the left neck.  It did not resolve spontaneously, so she was seen by Dr. [name] [facility], and a CT scan of the neck was performed on 04/25/05.  There was noted to be two lymph nodes on the left, and they were normal in size, but they were asymmetrical.  No other pathologic findings were noted, except for left internal jugular vein, which was atypically positioned.  However, because of these enlarged lymph nodes, a CT scan of the chest was also obtained by Dr. Renold Genta on 04/27/05, and the CT scan did reveal a poorly defined nodular opacity medially in the right upper lobe, measuring 6.5 mg anterior to posterior.  Within the right lower lobe there was noted to be a lobular nodular mass measuring 13 x 13 mm, and was felt to be worrisome for a primary lung carcinoma.  No other nodules or effusions were seen on the left lung.  On the mediastinal window images there was slight prominence of right hilar nodes, but no other evidence of mediastinal or hilar adenopathy.  A PET scan was thereafter obtained on 05/06/05.  The PET scan did reveal increased FDG activity within a small mass in the posterior superior right lower lobe, highly suspicious for a malignancy.  No significant nodal FDG uptake was identified within the hilar regions or the mediastinum.  Also noted was abnormal FDG uptake in the right middle lobe, corresponding to the CT scan findings, and again suspicious for a malignancy.  There was no evidence of metastatic disease within the neck, abdomen  or pelvis.  The patient was then referred to [facility], and was seen by Dr. [name].  The patient underwent a right lower lobe wedge resection with completion lobectomy and mediastinal lymph node biopsies.  The pathology revealed the following:  Right upper lobe wedge resection revealed pulmonary tissue with necrotizing granulomatous  inflammation.  No evidence of malignancy.  Right middle lobe wedge revealed again pulmonary tissue with necrotizing granulomatous inflammation.  No evidence of malignancy.  The right pleural lobe (lobectomy) revealed an adenocarcinoma, poorly differentiated, grade 3, measuring 1.2 cm.  The visceral pleura was negative.  Chest wall negative.  Mediastinum negative.  All margins were negative, including the pleural and parenchymal margins.  There was no evidence of lymphovascular invasion.  In all lymph nodes sampled, including level 11, level 7, level 12, 4R and 2R lymph nodes negative for malignancy.  The patient was staged as T1 N0 M0, stage IA adenocarcinoma of the right lung.  Postoperatively the patient's course was complicated by the development of pleural effusion, requiring diuresis.  She had multiple x-rays performed.  Last x-ray performed on 07/07/05 revealed persistent right pleural effusion.  It was recommended that the patient have followups at [facility].  Dr. [name] Baxley kindly refers the patient to me today for medical oncology evaluation.  Clinically, the patient states that she has recovered well.  She is once again walking.  She was a runner, and she is trying to get back in shape."  LEFT BREAST CANCER HISTORY:From [name]'s 09/30/2009 note:  "She tells me that most recently she had her yearly screening mammogram performed that revealed an abnormality.  She also on exam was noted to have a palpable lump in the upper left breast as well.  Because of the abnormality, patient on September 02, 2009 had a digital diagnostic mammogram of the left breast and ultrasound of the left breast.  The mammogram revealed a 7.0 mm area slightly increased density in the upper left breast.  There were no suspicious calcifications noted.  The breast was heterogeneously dense.  Patient then went onto have an ultrasound of the breast performed and again a 7.0 mm irregular hypoechoic shadowing mass  was noted in the 12 o'clock position of the left breast 5.0 cm from the nipple.  There were no enlarged or abnormal left axillary lymph nodes identified.  Patient went onto have a core biopsy performed on 09/02/2009 [phone]).  The needle core biopsy of the 12 o'clock mass revealed an invasive mammary carcinoma, low to intermediate grade.  It was lobular carcinoma.  Confirmatory immunohistochemical stains revealed the tumor to be strongly positive for cytokeratin AE1-AE3 with an infiltrative pattern of growth and the tumor was negative for E-cadherin stain confirming a lobular phenotype.  The tumor was estrogen receptor positive at 98%, progesterone receptor positive 6%, proliferation marker Ki-67 by MIB was 14%.  The tumor did not express HER-2/neu by CISH with a signal of 1.04.  Patient was seen by Dr. Neldon Mc on 09/08/2009 for discussion of surgical options.  His recommendation was a lumpectomy.  Patient also had bilateral MRI of the breasts performed, which revealed a 0.8 cm mildly enlarged area of mass-like enhancement at the 12 o'clock location corresponding to the biopsy-proven breast cancer.  There was no evidence of lymphadenopathy."   PAST MEDICAL HISTORY: Past Medical History:  Diagnosis Date  . Anemia   . BRCA negative 2011   BRCA I/ II negative  . Breast cancer ([place]) 08/2009   stage  2, rx with lumpectomy and xrt  . COPD (chronic obstructive pulmonary disease) ([facility])    pt.unsure of diagnosis status  . Emphysema of lung ([place])    pt. questions diagnosis  . Family history of adverse reaction to anesthesia    My Sister has nausea  . Family history of breast cancer   . Family history of colon cancer   . Family history of lung cancer   . GERD (gastroesophageal reflux disease)    not presently having symptoms  . Lung cancer ([place]) 06/06/05   stage 1 poorly differentiated adenocarcinoma, s/p right lower lobectomy.  . Osteoporosis   . Personal history of lung cancer   . STD  (sexually transmitted disease)    HSV    PAST SURGICAL HISTORY: Past Surgical History:  Procedure Laterality Date  . BREAST BIOPSY    . BREAST LUMPECTOMY  10/12/2009   Left lumpectomy and radiation, stage II, ER/PR+, Her 2 nu negative  . BUNIONECTOMY    . COLONOSCOPY    . LOBECTOMY  06/06/2005   RLL  . MASTECTOMY W/ SENTINEL NODE BIOPSY Bilateral 03/15/2018  . MASTECTOMY W/ SENTINEL NODE BIOPSY Bilateral 03/15/2018   Procedure: BILATERAL TOTAL MASTECTOMIES WITH RIGHT SENTINEL LYMPH NODE BIOPSY;  Surgeon: Rolm Bookbinder, MD;  Location: [place];  Service: General;  Laterality: Bilateral;  . PORTACATH PLACEMENT N/A 03/15/2018   Procedure: INSERTION PORT-A-CATH WITH [place];  Surgeon: Rolm Bookbinder, MD;  Location: [place];  Service: General;  Laterality: N/A;  . TONSILLECTOMY    . TUBAL LIGATION  1984    FAMILY HISTORY Family History  Problem Relation Age of Onset  . Allergies Mother   . Asthma Mother   . Lung cancer Mother   . Breast cancer Mother 43       recurrence age 89  . Colon cancer Father 30  . Prostate cancer Brother 43  . Breast cancer Sister 44       Recurrence age 39 BRCA negative  . Colon polyps Sister   . Breast cancer Sister 54  . Prostate cancer Brother 34  . Breast cancer Maternal Grandmother 68  . Colon cancer Maternal Aunt   . Leukemia Maternal Grandfather   . Lung cancer Maternal Aunt   . Breast cancer Cousin   . Esophageal cancer Neg Hx   . Rectal cancer Neg Hx   . Stomach cancer Neg Hx   The patient's maternal grandmother was diagnosed with cancer at age 82. Patient's mother was diagnosed at age 52. The patient's sister was diagnosed at age 35 with recurrence at 57 and is BRCA negative. A second sister was diagnosed at age 57.  GYNECOLOGIC HISTORY:  No LMP recorded. Patient is postmenopausal. Menarche age 34, the patient is GX P0. She went through the change of life in approximately age 74. She did not take hormone replacement.  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 It's just the 2 of them at home. She takes care of 68-year-old for a neighbor and is the primary caregiver for her husband who has had a couple of small strokes and has myelodysplasia. She helps a friend out with her business. She walks her 43 year old lab. 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 Smoking status: Former Smoker    Packs/day: 2.00  Years: 18.00    Pack years: 36.00    Last attempt to quit: 07/12/1987    Years since quitting: 30.8  . Smokeless tobacco: Never Used  Substance Use Topics  . Alcohol use: Yes    Alcohol/week: 4.0 standard drinks    Types: 4 Standard drinks or equivalent per week    Comment: social  . Drug use: No    Allergies  Allergen Reactions  . Clarithromycin Rash    Current Outpatient Medications  Medication Sig Dispense Refill  . acyclovir (ZOVIRAX) 400 MG tablet TAKE 1 TABLET BY MOUTH TWICE A DAY 90 tablet 3  . calcium gluconate 500 MG tablet Take 500 mg by mouth daily.    . Carboxymeth-Glycerin-Polysorb (REFRESH OPTIVE ADVANCED OP) Place 1 drop into both eyes daily.    . cholecalciferol (VITAMIN D) 1000 UNITS tablet Take 1,000 Units by mouth daily.    Marland Kitchen oxyCODONE (OXY IR/ROXICODONE) 5 MG immediate release tablet Take 1 tablet (5 mg total) by mouth every 4 (four) hours as needed for moderate pain. 10 tablet 0  . Psyllium (METAMUCIL PO) Take 5 mLs by mouth daily.     . vitamin C (ASCORBIC ACID) 500 MG tablet Take 500 mg by mouth daily.     No current facility-administered medications for this visit.     OBJECTIVE: Middle-aged white woman no acute distress  Vitals:   05/03/18 1215  BP: (!) 161/83  Pulse: 81  Resp: 18  Temp: (!) 97.4 F (36.3 C)  SpO2: 98%     Body mass index is 18.63 kg/m.    ECOG FS:1 - Symptomatic  but completely ambulatory   Sclerae unicteric, pupils round and equal Oropharynx clear and moist No cervical or supraclavicular adenopathy Lungs no rales or rhonchi Heart regular rate and rhythm Abd soft, nontender, positive bowel sounds MSK no focal spinal tenderness, no upper extremity lymphedema Neuro: nonfocal, well oriented, appropriate affect Breasts: Is post bilateral mastectomies.  No evidence of residual or recurrent disease.  Both axillae are benign.  LAB RESULTS:  CMP     Component Value Date/Time   NA 143 04/26/2018 0809   NA 139 12/22/2016 1141   K 4.2 04/26/2018 0809   K 4.2 12/22/2016 1141   CL 107 04/26/2018 0809   CL 103 11/22/2012 1025   CO2 26 04/26/2018 0809   CO2 28 12/22/2016 1141   GLUCOSE 88 04/26/2018 0809   GLUCOSE 92 12/22/2016 1141   GLUCOSE 94 11/22/2012 1025   BUN 11 04/26/2018 0809   BUN 13.2 12/22/2016 1141   CREATININE 0.62 04/26/2018 0809   CREATININE 0.64 11/07/2017 0906   CREATININE 0.7 12/22/2016 1141   CALCIUM 9.3 04/26/2018 0809   CALCIUM 9.7 12/22/2016 1141   PROT 7.2 04/26/2018 0809   PROT 7.0 12/22/2016 1141   ALBUMIN 3.7 04/26/2018 0809   ALBUMIN 3.8 12/22/2016 1141   AST 19 04/26/2018 0809   AST 18 12/22/2016 1141   ALT 11 04/26/2018 0809   ALT 10 12/22/2016 1141   ALKPHOS 93 04/26/2018 0809   ALKPHOS 81 12/22/2016 1141   BILITOT 0.3 04/26/2018 0809   BILITOT 0.65 12/22/2016 1141   GFRNONAA >60 04/26/2018 0809   GFRNONAA 93 11/07/2017 0906   GFRAA >60 04/26/2018 0809   GFRAA 108 11/07/2017 0906    INo results found for: SPEP, UPEP  Lab Results  Component Value Date   WBC 3.3 (L) 05/03/2018   NEUTROABS 2.1 05/03/2018   HGB 10.5 (L) 05/03/2018   HCT 33.0 (  L) 05/03/2018   MCV 94.3 05/03/2018   PLT 252 05/03/2018      Chemistry      Component Value Date/Time   NA 143 04/26/2018 0809   NA 139 12/22/2016 1141   K 4.2 04/26/2018 0809   K 4.2 12/22/2016 1141   CL 107 04/26/2018 0809   CL 103 11/22/2012 1025     CO2 26 04/26/2018 0809   CO2 28 12/22/2016 1141   BUN 11 04/26/2018 0809   BUN 13.2 12/22/2016 1141   CREATININE 0.62 04/26/2018 0809   CREATININE 0.64 11/07/2017 0906   CREATININE 0.7 12/22/2016 1141      Component Value Date/Time   CALCIUM 9.3 04/26/2018 0809   CALCIUM 9.7 12/22/2016 1141   ALKPHOS 93 04/26/2018 0809   ALKPHOS 81 12/22/2016 1141   AST 19 04/26/2018 0809   AST 18 12/22/2016 1141   ALT 11 04/26/2018 0809   ALT 10 12/22/2016 1141   BILITOT 0.3 04/26/2018 0809   BILITOT 0.65 12/22/2016 1141       Lab Results  Component Value Date   LABCA2 18 09/30/2009    No components found for: [id number]  No results for input(s): INR in the last 168 hours.  Urinalysis    Component Value Date/Time   BILIRUBINUR neg 09/12/2013 1012   PROTEINUR neg 09/12/2013 1012   UROBILINOGEN negative 09/12/2013 1012   NITRITE neg 09/12/2013 1012   LEUKOCYTESUR Negative 09/12/2013 1012    STUDIES: Dg Ankle Complete Left  Result Date: 04/15/2018 CLINICAL DATA:  Heard a pop in LEFT ankle when stood up from using the restroom yesterday, fall, lateral LEFT ankle pain EXAM: LEFT ANKLE COMPLETE - 3+ VIEW COMPARISON:  None FINDINGS: Lateral soft tissue swelling. Mild osseous demineralization. Ankle joint space preserved. Rounded ossicle identified adjacent to tip of lateral malleolus, appears corticated and old. An additional linear calcific density is seen at the lateral joint line which could represent a tiny avulsion fragment. No additional fracture, dislocation, or bone destruction. IMPRESSION: Questionable tiny avulsion fragment at lateral joint line. Lateral soft tissue swelling. Electronically Signed   By: Lavonia Dana M.D.   On: 04/15/2018 11:40    ASSESSMENT: 67 y.o. [place], [place] woman status post right upper lobe wedge resection, middle lobe wedge resection, lower lobectomy and mediastinal lymph node dissection 06/06/2005 for a 1.2 cm grade 3 adenocarcinoma, pT1 pN1, stage Ia  (a)  followed at [facility] with every other year chest CT, most recently 06/06/2017  LEFT BREAST CANCER: (1) status post left breast upper outer quadrant biopsy 09/02/2009 for an invasive lobular carcinoma (E-cadherin negative) estrogen receptor 98% positive, progesterone receptor 6% positive, with an MIB-1 of 14% and no HER-2 amplification. [[id number]]  (2) status post left lumpectomy and sentinel lymph node sampling 10/12/2009 for a pT1b pN1, stage IIA invasive lobular breast cancer, with negative margins.  (3) Oncotype DX score of 16 predicts a risk of outside the breast recurrence of 10% if the patient's only systemic treatment is tamoxifen for 5 years. It also predicts no benefit from adjuvant chemotherapy  (4) completed adjuvant radiation therapy 01/28/2010, receiving 5040 cGy to the left breast, with a boost to the upper inner aspect of the breast (to a cumulative dose of 6300 cGy); the axillary and supraclavicular regions received 4500 cGy  (5) started anastrozole July 2011, completedI 5 years July 2016  RIGHT BREAST CANCER: (6) status post right breast upper outer quadrant biopsy 01/29/2018 for a clinical  T1c N0, stage IA invasive ductal  carcinoma, grade 2, estrogen receptor positive, progesterone receptor negative, HER-2 amplified, with an MIB-1 of 15%  (7) status post bilateral mastectomy with right sentinel lymph node sampling 03/15/2018, showing  (a) on the left, no malignancy noted  (b) on the right, a pT1c pN0,stage IA invasive ductal carcinoma, grade 2, with negative margins.  (c) a total of 6 lymph nodes removed from the right axilla, none from the left  (8) chemotherapy consisting of paclitaxel weekly x12 started 04/26/2018, with trastuzumab to be given for 1 year  (a) myocardial perfusion study 12/20/2017 showed an ejection fraction of 75% (hyperdynamic  (b) paclitaxel switched to Abraxane with the dose #2 because of initial reaction to Taxol  (9) adjuvant radiation not  indicated  (10) antiestrogens to follow at the completion of local treatment  (11) bone density 02/21/2012 at [facility] showed osteoporosis with a T score of -2.5; on alendronate  (12) repeat genetics testing 02/08/2018 offered through [facility] Common Hereditary Cancers Panel and 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 There were no deleterious mutations in: APC, ATM, AXIN2, BARD1, BMPR1A, BRCA1, BRCA2, BRIP1, CDH1, CDKN2A (p14ARF), CDKN2A (p16INK4a), CKD4, CHEK2, CTNNA1, DICER1, EPCAM (Deletion/duplication testing only), GREM1 (promoter region deletion/duplication testing only), KIT, MEN1, MLH1, MSH2, MSH3, MSH6, MUTYH, NBN, NF1, NHTL1, PALB2, PDGFRA, PMS2, POLD1, POLE, PTEN, RAD50, RAD51C, RAD51D, SDHB, SDHC, SDHD, SMAD4, SMARCA4. STK11, TSC1, TSC2, and VHL.  The following genes were evaluated for sequence changes only: SDHA and HOXB13 c.251G>A variant only.  (a) see "cancer surveillance" below for details of long term Maylon Peppers related screening studies  PLAN: Anna Valentine had no further problems from her initial treatment after leaving the cancer center.  We have switched her to Abraxane, which she should tolerate better.  We again discussed the possible toxicity side effects complications of this agent.    She will use Compazine tonight and tomorrow morning and then as needed.  If she has any side effects from the Compazine we will switch her to Zofran.  She will receive the trastuzumab weekly for the.  Of time that she takes the Abraxane after which we will switch her to every 3 weeks.  I have encouraged her to continue to exercise as tolerated.  She will see me again 05/17/2018 and every 2 weeks until she completes the chemotherapy treatments.  She knows to call for any other issues that may develop before then.  Nicolette Gieske, Virgie Dad, MD  05/03/18 12:38 PM Medical Oncology and Hematology [facility] [address] [place], [place] [address] Tel.  [phone]    Fax. [phone]  I, Wilburn Mylar, am acting as scribe for Chauncey Cruel MD.  See med    ADDENDUM: TP53 mutations Li-Fraumeni syndrome increases the risk for soft tissue sarcomas, osteosarcomas, pre-menopausal breast cancer in women, brain tumors, adrenocortical carcinoma, and leukemias. These cancers can occur in childhood, and throughout adulthood, with approximately 50% of individuals being diagnosed with cancer by age 74 and 90% by age 18.We discussed that Ms. Dilone's family is not a classic Li-Fraumeni family, however this result is still important to share with family members as we cannot predict how it will affect other individuals and it is still important for Ms. Harbach to have additional screening.   THINGS TO AVOID: Because of the increased risk for cancer, avoiding risks that can increase that risk is recommended. Therefore, limiting sun exposure and using sunscreen, and eliminating tobacco use and exposure to other known or suspected carcinogens is encouraged.  There is evidence of increased sensitivity to ionizing radiation. Therefore individuals should avoid or minimize exposure to diagnostic and therapeutic radiation whenever possible, to reduce the risk of secondary radiation-induced malignancies is encouraged.  CANCER SURVEILLANCE:The following is recommended for following individuals with LFS includes:  1. Children and adults should undergo comprehensive annual physical examination including careful skin and neurological exams with the focus on risks for rare early onset cancer and second malignancies in cancer survivors. 2. To reduce the risk for breast cancer, prophylactic bilateral mastectomy is the most effective option.  3. To reduce the risk for colon cancer, adults should consider colonoscopy every 2-5 years beginning no later than age 3 years.  4. Whole body MRI with brain MRI annually.  It may be helpful to participate in a Li-Fraumeni  Syndrome clinic so that updates on screening and surveillance for the condition may be conveyed.

## 2018-05-10 ENCOUNTER — Ambulatory Visit: Payer: Medicare Other

## 2018-05-10 ENCOUNTER — Inpatient Hospital Stay: Payer: Medicare Other

## 2018-05-10 VITALS — BP 152/81 | HR 73 | Temp 98.3°F | Resp 18

## 2018-05-10 DIAGNOSIS — Z170 Estrogen receptor positive status [ER+]: Secondary | ICD-10-CM | POA: Diagnosis not present

## 2018-05-10 DIAGNOSIS — C50212 Malignant neoplasm of upper-inner quadrant of left female breast: Secondary | ICD-10-CM

## 2018-05-10 DIAGNOSIS — Z5111 Encounter for antineoplastic chemotherapy: Secondary | ICD-10-CM | POA: Diagnosis not present

## 2018-05-10 DIAGNOSIS — R203 Hyperesthesia: Secondary | ICD-10-CM | POA: Diagnosis not present

## 2018-05-10 DIAGNOSIS — Z95828 Presence of other vascular implants and grafts: Secondary | ICD-10-CM

## 2018-05-10 DIAGNOSIS — Z79899 Other long term (current) drug therapy: Secondary | ICD-10-CM | POA: Diagnosis not present

## 2018-05-10 DIAGNOSIS — Z5112 Encounter for antineoplastic immunotherapy: Secondary | ICD-10-CM | POA: Diagnosis not present

## 2018-05-10 LAB — CBC WITH DIFFERENTIAL/PLATELET
ABS IMMATURE GRANULOCYTES: 0.02 10*3/uL (ref 0.00–0.07)
Basophils Absolute: 0 10*3/uL (ref 0.0–0.1)
Basophils Relative: 0 %
Eosinophils Absolute: 0.1 10*3/uL (ref 0.0–0.5)
Eosinophils Relative: 4 %
HEMATOCRIT: 31.8 % — AB (ref 36.0–46.0)
HEMOGLOBIN: 10.1 g/dL — AB (ref 12.0–15.0)
Immature Granulocytes: 1 %
Lymphocytes Relative: 31 %
Lymphs Abs: 0.8 10*3/uL (ref 0.7–4.0)
MCH: 30.1 pg (ref 26.0–34.0)
MCHC: 31.8 g/dL (ref 30.0–36.0)
MCV: 94.9 fL (ref 80.0–100.0)
MONO ABS: 0.3 10*3/uL (ref 0.1–1.0)
MONOS PCT: 10 %
NEUTROS ABS: 1.3 10*3/uL — AB (ref 1.7–7.7)
Neutrophils Relative %: 54 %
Platelets: 247 10*3/uL (ref 150–400)
RBC: 3.35 MIL/uL — ABNORMAL LOW (ref 3.87–5.11)
RDW: 13.6 % (ref 11.5–15.5)
WBC: 2.4 10*3/uL — ABNORMAL LOW (ref 4.0–10.5)
nRBC: 0 % (ref 0.0–0.2)

## 2018-05-10 LAB — COMPREHENSIVE METABOLIC PANEL
ALK PHOS: 85 U/L (ref 38–126)
ALT: 20 U/L (ref 0–44)
AST: 24 U/L (ref 15–41)
Albumin: 3.6 g/dL (ref 3.5–5.0)
Anion gap: 7 (ref 5–15)
BILIRUBIN TOTAL: 0.4 mg/dL (ref 0.3–1.2)
BUN: 16 mg/dL (ref 8–23)
CALCIUM: 9.8 mg/dL (ref 8.9–10.3)
CO2: 28 mmol/L (ref 22–32)
CREATININE: 0.67 mg/dL (ref 0.44–1.00)
Chloride: 106 mmol/L (ref 98–111)
Glucose, Bld: 90 mg/dL (ref 70–99)
Potassium: 4.3 mmol/L (ref 3.5–5.1)
Sodium: 141 mmol/L (ref 135–145)
TOTAL PROTEIN: 6.8 g/dL (ref 6.5–8.1)

## 2018-05-10 MED ORDER — DIPHENHYDRAMINE HCL 25 MG PO CAPS
25.0000 mg | ORAL_CAPSULE | Freq: Once | ORAL | Status: AC
  Administered 2018-05-10: 25 mg via ORAL

## 2018-05-10 MED ORDER — SODIUM CHLORIDE 0.9 % IV SOLN
Freq: Once | INTRAVENOUS | Status: AC
  Administered 2018-05-10: 14:00:00 via INTRAVENOUS
  Filled 2018-05-10: qty 250

## 2018-05-10 MED ORDER — ACETAMINOPHEN 325 MG PO TABS
650.0000 mg | ORAL_TABLET | Freq: Once | ORAL | Status: AC
  Administered 2018-05-10: 650 mg via ORAL

## 2018-05-10 MED ORDER — ACETAMINOPHEN 325 MG PO TABS
ORAL_TABLET | ORAL | Status: AC
  Filled 2018-05-10: qty 2

## 2018-05-10 MED ORDER — DIPHENHYDRAMINE HCL 25 MG PO CAPS
ORAL_CAPSULE | ORAL | Status: AC
  Filled 2018-05-10: qty 2

## 2018-05-10 MED ORDER — PACLITAXEL PROTEIN-BOUND CHEMO INJECTION 100 MG
100.0000 mg/m2 | Freq: Once | INTRAVENOUS | Status: AC
  Administered 2018-05-10: 150 mg via INTRAVENOUS
  Filled 2018-05-10: qty 30

## 2018-05-10 MED ORDER — HEPARIN SOD (PORK) LOCK FLUSH 100 UNIT/ML IV SOLN
500.0000 [IU] | Freq: Once | INTRAVENOUS | Status: AC | PRN
  Administered 2018-05-10: 500 [IU]
  Filled 2018-05-10: qty 5

## 2018-05-10 MED ORDER — PROCHLORPERAZINE MALEATE 10 MG PO TABS
10.0000 mg | ORAL_TABLET | Freq: Once | ORAL | Status: AC
  Administered 2018-05-10: 10 mg via ORAL

## 2018-05-10 MED ORDER — PROCHLORPERAZINE MALEATE 10 MG PO TABS
ORAL_TABLET | ORAL | Status: AC
  Filled 2018-05-10: qty 1

## 2018-05-10 MED ORDER — SODIUM CHLORIDE 0.9% FLUSH
10.0000 mL | Freq: Once | INTRAVENOUS | Status: AC
  Administered 2018-05-10: 10 mL
  Filled 2018-05-10: qty 10

## 2018-05-10 MED ORDER — TRASTUZUMAB CHEMO 150 MG IV SOLR
2.0000 mg/kg | Freq: Once | INTRAVENOUS | Status: AC
  Administered 2018-05-10: 105 mg via INTRAVENOUS
  Filled 2018-05-10: qty 5

## 2018-05-10 MED ORDER — SODIUM CHLORIDE 0.9% FLUSH
10.0000 mL | INTRAVENOUS | Status: DC | PRN
  Administered 2018-05-10: 10 mL
  Filled 2018-05-10: qty 10

## 2018-05-10 NOTE — Progress Notes (Signed)
Per Dr. Jana Hakim ok to tx with ANC 1.3. Ok to tx with stress test results on 12/29/17. Val, RN to help pt schedule another ECHO soon.

## 2018-05-10 NOTE — Patient Instructions (Signed)
[facility] Discharge Instructions for Patients Receiving Chemotherapy  Today you received the following chemotherapy agents:  trastuzumab (Herceptin) and paclitaxel-protein bound (Abraxane).  To help prevent nausea and vomiting after your treatment, we encourage you to take your nausea medication as directed.   If you develop nausea and vomiting that is not controlled by your nausea medication, call the clinic.   BELOW ARE SYMPTOMS THAT SHOULD BE REPORTED IMMEDIATELY:  *FEVER GREATER THAN 100.5 F  *CHILLS WITH OR WITHOUT FEVER  NAUSEA AND VOMITING THAT IS NOT CONTROLLED WITH YOUR NAUSEA MEDICATION  *UNUSUAL SHORTNESS OF BREATH  *UNUSUAL BRUISING OR BLEEDING  TENDERNESS IN MOUTH AND THROAT WITH OR WITHOUT PRESENCE OF ULCERS  *URINARY PROBLEMS  *BOWEL PROBLEMS  UNUSUAL RASH Items with * indicate a potential emergency and should be followed up as soon as possible.  Feel free to call the clinic should you have any questions or concerns. The clinic phone number is [phone].  Please show the [facility] at check-in to the Emergency Department and triage nurse.

## 2018-05-11 ENCOUNTER — Ambulatory Visit: Payer: Medicare Other

## 2018-05-11 ENCOUNTER — Other Ambulatory Visit: Payer: Medicare Other

## 2018-05-16 ENCOUNTER — Ambulatory Visit: Payer: Medicare Other | Attending: General Surgery

## 2018-05-16 DIAGNOSIS — Z483 Aftercare following surgery for neoplasm: Secondary | ICD-10-CM | POA: Diagnosis not present

## 2018-05-16 DIAGNOSIS — R222 Localized swelling, mass and lump, trunk: Secondary | ICD-10-CM | POA: Insufficient documentation

## 2018-05-16 DIAGNOSIS — M25611 Stiffness of right shoulder, not elsewhere classified: Secondary | ICD-10-CM | POA: Insufficient documentation

## 2018-05-16 DIAGNOSIS — M25612 Stiffness of left shoulder, not elsewhere classified: Secondary | ICD-10-CM | POA: Diagnosis not present

## 2018-05-16 NOTE — Therapy (Signed)
[place], [place], [address] Phone: [phone]   Fax:  [phone]  Physical Therapy Treatment  Patient Details  Name: Anna Valentine MRN: [id number] Date of Birth: 1950-11-02 Referring Provider (PT): Dr. Donne Hazel    Encounter Date: 05/16/2018  PT End of Session - 05/16/18 1110    Visit Number  10    Number of Visits  13    Date for PT Re-Evaluation  06/13/18    PT Start Time  1018    PT Stop Time  1110    PT Time Calculation (min)  52 min    Activity Tolerance  Patient tolerated treatment well    Behavior During Therapy  [facility] for tasks assessed/performed       Past Medical History:  Diagnosis Date  . Anemia   . BRCA negative 2011   BRCA I/ II negative  . Breast cancer ([place]) 08/2009   stage 2, rx with lumpectomy and xrt  . COPD (chronic obstructive pulmonary disease) (HCC)    pt.unsure of diagnosis status  . Emphysema of lung ([place])    pt. questions diagnosis  . Family history of adverse reaction to anesthesia    My Sister has nausea  . Family history of breast cancer   . Family history of colon cancer   . Family history of lung cancer   . GERD (gastroesophageal reflux disease)    not presently having symptoms  . Lung cancer ([place]) 06/06/05   stage 1 poorly differentiated adenocarcinoma, s/p right lower lobectomy.  . Osteoporosis   . Personal history of lung cancer   . STD (sexually transmitted disease)    HSV    Past Surgical History:  Procedure Laterality Date  . BREAST BIOPSY    . BREAST LUMPECTOMY  10/12/2009   Left lumpectomy and radiation, stage II, ER/PR+, Her 2 nu negative  . BUNIONECTOMY    . COLONOSCOPY    . LOBECTOMY  06/06/2005   RLL  . MASTECTOMY W/ SENTINEL NODE BIOPSY Bilateral 03/15/2018  . MASTECTOMY W/ SENTINEL NODE BIOPSY Bilateral 03/15/2018   Procedure: BILATERAL TOTAL MASTECTOMIES WITH RIGHT SENTINEL LYMPH NODE BIOPSY;  Surgeon: Rolm Bookbinder, MD;   Location: [facility] [facility];  Service: General;  Laterality: Bilateral;  . PORTACATH PLACEMENT N/A 03/15/2018   Procedure: INSERTION PORT-A-CATH WITH [place];  Surgeon: Rolm Bookbinder, MD;  Location: [facility];  Service: General;  Laterality: N/A;  . TONSILLECTOMY    . TUBAL LIGATION  1984    There were no vitals filed for this visit.  Subjective Assessment - 05/16/18 1023    Subjective  Everything is going really well with my HEP except  Inotic brief, intermittent sharp pains at bil sides of my upper chest wall where my sensation is still not great. And the normal tightness is still there at the end of my ROM. I'd still like to come 1x/wk to focus on that.     Pertinent History   New right breast cancer diagnosed on 01/25/2018.  She underwent a bilateral mastectomy wit 6 lymph nodes removed on the right on 03/15/2018. 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  She also had a port placed on the right side at that time.   Pt had previous left breast cancer 2008 with sentinel node removal and radiation, lung cancer in 2006 She has history of frozen shoulder on the right that has resolved and the start of frozen shoulder on  the left that resolved with radiation.  She has LFS .    Patient Stated Goals  to keep a good posture and get rid of the pain     Currently in Pain?  No/denies         [facility] PT Assessment - 05/16/18 0001      AROM   Right Shoulder Flexion  163 Degrees    Right Shoulder ABduction  171 Degrees    Left Shoulder Flexion  143 Degrees    Left Shoulder ABduction  169 Degrees             Quick Dash - 05/16/18 0001    Open a tight or new jar  Mild difficulty    Do heavy household chores (wash walls, wash floors)  Mild difficulty    Carry a shopping bag or briefcase  No difficulty    Wash your back  No difficulty    Use a knife to cut food  No difficulty    Recreational activities in which you take some force or impact through your arm, shoulder, or  hand (golf, hammering, tennis)  Moderate difficulty    During the past week, to what extent has your arm, shoulder or hand problem interfered with your normal social activities with family, friends, neighbors, or groups?  Not at all    During the past week, to what extent has your arm, shoulder or hand problem limited your work or other regular daily activities  Not at all    Arm, shoulder, or hand pain.  Mild    Tingling (pins and needles) in your arm, shoulder, or hand  None    Difficulty Sleeping  Mild difficulty    DASH Score  13.64 %             [facility] Adult PT Treatment/Exercise - 05/16/18 0001      Manual Therapy   Manual therapy comments  Issued chip pack for pt to wear in her sports bra at night and when she's around the house at area of swelling at Lt chest pocket of swelling, also small piece of peach medi dotted foam (small dots) to try at same area    Myofascial Release  To axilla, chest and incision supine to tolerance bilaterally, but being mindful of new opening noted at mid Rt incision where pt did have a scar.     Manual Lymphatic Drainage (MLD)  Briefly to Lt chest wall superior to incision at area of swelling.     Passive ROM  In Supine to bil shoulders into flexion, abduction and D2 to pts tolerance and pt over towel roll along T-spine between scapulae     Neural Stretch  --                   PT Long Term Goals - 05/16/18 1123      PT LONG TERM GOAL #1   Title  Pt will report pain is reduced by 50%    Baseline  Pt reports pain mcu improved but did not rate today-04/24/18; 95% improvement-05/03/18    Status  Achieved      PT LONG TERM GOAL #2   Title  Pt will have 150 degrees of bilateral shoulder abduction so that she can return to all household activities at home     Baseline  Rt 155 and Lt 149 degrees-04/24/18; Rt 164 and Lt 160 degrees-05/03/18; Rt 171 and Lt 169- 05/16/18    Status  Achieved  PT LONG TERM GOAL #3   Title  Pt will reduce  quick DASH score to < 10 indicating a functional improvment of UEs    Baseline  27.27 on 04/04/2018; 13.64- 05/16/18    Status  Partially Met      PT LONG TERM GOAL #4   Title  Pt will verbalize understanding of lymphedema risk reduction practices     Baseline  Pt attended ABC class and has no further questions regarding lymphedmea risk reduction-04/24/18    Status  Achieved      PT LONG TERM GOAL #5   Title  Pt to report perception of bil axillary tightness improved by 80% to allow increased ease with reaching for ADLs    Baseline  40% -05/16/18    Time  4    Period  Weeks    Status  New            Plan - 05/16/18 1111    Clinical Impression Statement  Pt reports doing very well with HEP for strengthening and stretching. Also has started incorporating plank and push ups on inclined surface (counter or chair) as was instructed here. Focused today on manual therapy to help pt attain maximal end ROM. Will renew today for 1x/wk to focus on end ROM as pt, though improved, is still limited and demonstrates tight tissue at bil shoulders and upper quadrant areas. Opening noted at Rt incision where pt did have a scar for awhile. See picture in note. Also showed pt with mirror and instructed her to keep area clean and dry and watch for signs of infection, otherwise, this is probable to be slow healing. Pt verbalized understanding.     Rehab Potential  Excellent    Clinical Impairments Affecting Rehab Potential  recent surgery    PT Frequency  1x / week    PT Duration  4 weeks    PT Treatment/Interventions  ADLs/Self Care Home Management;Manual techniques;Orthotic Fit/Training;Patient/family education;Manual lymph drainage;Compression bandaging;Scar mobilization;Passive range of motion    PT Next Visit Plan  Focus on end ROM to bil shoulders and manual therapy to promote this to bil upper quadrants. Assess Rt mid mastectomy incision where opening noted today, be mindful of this area when stretching.      Consulted and Agree with Plan of Care  Patient       Patient will benefit from skilled therapeutic intervention in order to improve the following deficits and impairments:  Pain, Impaired sensation, Increased fascial restricitons, Decreased scar mobility, Postural dysfunction, Decreased range of motion, Decreased strength, Impaired UE functional use, Increased edema  Visit Diagnosis: Stiffness of right shoulder, not elsewhere classified  Stiffness of left shoulder, not elsewhere classified  Aftercare following surgery for neoplasm  Localized swelling, mass and lump, trunk     Problem List Patient Active Problem List   Diagnosis Date Noted  . Port-A-Cath in place 04/26/2018  . Breast cancer, right ([place]) 03/15/2018  . Genetic testing 02/20/2018  . Family history of breast cancer   . Family history of lung cancer   . Personal history of lung cancer   . Chest pain 11/28/2017  . Abnormal CT of the chest 11/30/2016  . Malignant neoplasm of upper-inner quadrant of left breast in female, estrogen receptor positive ([place]) 06/16/2014  . Osteopenia 09/13/2013  . Postmenopausal atrophic vaginitis 09/13/2013  . Unspecified vitamin D deficiency 09/13/2013  . Malignant neoplasm of lower lobe of right lung ([place]) 07/24/2012  . History of lung cancer 03/11/2011  .  Anxiety 03/11/2011  . History of neutropenia 03/11/2011  . Family history of colon cancer 03/11/2011  . Cough 11/15/2010  . Bronchiectasis without acute exacerbation ([place]) 11/15/2010    Otelia Limes, PTA 05/16/2018, 11:49 AM  [place], [place], [address] Phone: [phone]   Fax:  [phone]  Name: Anna Valentine MRN: [id number] Date of Birth: 06/22/1951

## 2018-05-17 ENCOUNTER — Other Ambulatory Visit: Payer: Self-pay | Admitting: Oncology

## 2018-05-17 ENCOUNTER — Inpatient Hospital Stay: Payer: Medicare Other

## 2018-05-17 ENCOUNTER — Inpatient Hospital Stay: Payer: Medicare Other | Attending: Oncology

## 2018-05-17 ENCOUNTER — Telehealth: Payer: Self-pay | Admitting: Oncology

## 2018-05-17 VITALS — BP 152/80 | HR 71 | Temp 98.3°F | Resp 18

## 2018-05-17 DIAGNOSIS — Z170 Estrogen receptor positive status [ER+]: Secondary | ICD-10-CM | POA: Diagnosis not present

## 2018-05-17 DIAGNOSIS — Z95828 Presence of other vascular implants and grafts: Secondary | ICD-10-CM

## 2018-05-17 DIAGNOSIS — C50212 Malignant neoplasm of upper-inner quadrant of left female breast: Secondary | ICD-10-CM | POA: Insufficient documentation

## 2018-05-17 DIAGNOSIS — Z87891 Personal history of nicotine dependence: Secondary | ICD-10-CM | POA: Insufficient documentation

## 2018-05-17 DIAGNOSIS — Z5111 Encounter for antineoplastic chemotherapy: Secondary | ICD-10-CM | POA: Diagnosis not present

## 2018-05-17 DIAGNOSIS — Z803 Family history of malignant neoplasm of breast: Secondary | ICD-10-CM | POA: Diagnosis not present

## 2018-05-17 DIAGNOSIS — L309 Dermatitis, unspecified: Secondary | ICD-10-CM | POA: Insufficient documentation

## 2018-05-17 DIAGNOSIS — Z5112 Encounter for antineoplastic immunotherapy: Secondary | ICD-10-CM | POA: Insufficient documentation

## 2018-05-17 LAB — CBC WITH DIFFERENTIAL/PLATELET
Abs Immature Granulocytes: 0.01 10*3/uL (ref 0.00–0.07)
BASOS PCT: 0 %
Basophils Absolute: 0 10*3/uL (ref 0.0–0.1)
EOS PCT: 4 %
Eosinophils Absolute: 0.1 10*3/uL (ref 0.0–0.5)
HCT: 31.2 % — ABNORMAL LOW (ref 36.0–46.0)
Hemoglobin: 10.2 g/dL — ABNORMAL LOW (ref 12.0–15.0)
Immature Granulocytes: 0 %
Lymphocytes Relative: 39 %
Lymphs Abs: 0.9 10*3/uL (ref 0.7–4.0)
MCH: 30.7 pg (ref 26.0–34.0)
MCHC: 32.7 g/dL (ref 30.0–36.0)
MCV: 94 fL (ref 80.0–100.0)
MONOS PCT: 11 %
Monocytes Absolute: 0.2 10*3/uL (ref 0.1–1.0)
NEUTROS PCT: 46 %
Neutro Abs: 1.1 10*3/uL — ABNORMAL LOW (ref 1.7–7.7)
PLATELETS: 241 10*3/uL (ref 150–400)
RBC: 3.32 MIL/uL — ABNORMAL LOW (ref 3.87–5.11)
RDW: 13.8 % (ref 11.5–15.5)
WBC: 2.3 10*3/uL — ABNORMAL LOW (ref 4.0–10.5)
nRBC: 0 % (ref 0.0–0.2)

## 2018-05-17 LAB — COMPREHENSIVE METABOLIC PANEL
ALBUMIN: 3.5 g/dL (ref 3.5–5.0)
ALK PHOS: 80 U/L (ref 38–126)
ALT: 21 U/L (ref 0–44)
AST: 23 U/L (ref 15–41)
Anion gap: 7 (ref 5–15)
BILIRUBIN TOTAL: 0.3 mg/dL (ref 0.3–1.2)
BUN: 17 mg/dL (ref 8–23)
CALCIUM: 8.7 mg/dL — AB (ref 8.9–10.3)
CO2: 26 mmol/L (ref 22–32)
Chloride: 108 mmol/L (ref 98–111)
Creatinine, Ser: 0.62 mg/dL (ref 0.44–1.00)
GFR calc Af Amer: >60 mL/min (ref >60)
GFR calc non Af Amer: >60 mL/min (ref >60)
GLUCOSE: 82 mg/dL (ref 70–99)
POTASSIUM: 3.8 mmol/L (ref 3.5–5.1)
Sodium: 141 mmol/L (ref 135–145)
TOTAL PROTEIN: 6.3 g/dL — AB (ref 6.5–8.1)

## 2018-05-17 MED ORDER — TRASTUZUMAB CHEMO 150 MG IV SOLR
2.0000 mg/kg | Freq: Once | INTRAVENOUS | Status: AC
  Administered 2018-05-17: 105 mg via INTRAVENOUS
  Filled 2018-05-17: qty 5

## 2018-05-17 MED ORDER — HEPARIN SOD (PORK) LOCK FLUSH 100 UNIT/ML IV SOLN
500.0000 [IU] | Freq: Once | INTRAVENOUS | Status: AC | PRN
  Administered 2018-05-17: 500 [IU]
  Filled 2018-05-17: qty 5

## 2018-05-17 MED ORDER — DIPHENHYDRAMINE HCL 25 MG PO CAPS
25.0000 mg | ORAL_CAPSULE | Freq: Once | ORAL | Status: AC
  Administered 2018-05-17: 25 mg via ORAL

## 2018-05-17 MED ORDER — SODIUM CHLORIDE 0.9 % IV SOLN
Freq: Once | INTRAVENOUS | Status: AC
  Administered 2018-05-17: 13:00:00 via INTRAVENOUS
  Filled 2018-05-17: qty 250

## 2018-05-17 MED ORDER — DIPHENHYDRAMINE HCL 25 MG PO CAPS
ORAL_CAPSULE | ORAL | Status: AC
  Filled 2018-05-17: qty 1

## 2018-05-17 MED ORDER — PROCHLORPERAZINE MALEATE 10 MG PO TABS
10.0000 mg | ORAL_TABLET | Freq: Once | ORAL | Status: AC
  Administered 2018-05-17: 10 mg via ORAL

## 2018-05-17 MED ORDER — ACETAMINOPHEN 325 MG PO TABS
ORAL_TABLET | ORAL | Status: AC
  Filled 2018-05-17: qty 2

## 2018-05-17 MED ORDER — PROCHLORPERAZINE MALEATE 10 MG PO TABS
ORAL_TABLET | ORAL | Status: AC
  Filled 2018-05-17: qty 1

## 2018-05-17 MED ORDER — SODIUM CHLORIDE 0.9% FLUSH
10.0000 mL | Freq: Once | INTRAVENOUS | Status: AC
  Administered 2018-05-17: 10 mL
  Filled 2018-05-17: qty 10

## 2018-05-17 MED ORDER — ACETAMINOPHEN 325 MG PO TABS
650.0000 mg | ORAL_TABLET | Freq: Once | ORAL | Status: AC
  Administered 2018-05-17: 650 mg via ORAL

## 2018-05-17 MED ORDER — PACLITAXEL PROTEIN-BOUND CHEMO INJECTION 100 MG
100.0000 mg/m2 | Freq: Once | INTRAVENOUS | Status: AC
  Administered 2018-05-17: 150 mg via INTRAVENOUS
  Filled 2018-05-17: qty 30

## 2018-05-17 MED ORDER — SODIUM CHLORIDE 0.9% FLUSH
10.0000 mL | INTRAVENOUS | Status: DC | PRN
  Administered 2018-05-17: 10 mL
  Filled 2018-05-17: qty 10

## 2018-05-17 NOTE — Patient Instructions (Signed)
[facility] Discharge Instructions for Patients Receiving Chemotherapy  Today you received the following chemotherapy agents:  trastuzumab (Herceptin) and paclitaxel-protein bound (Abraxane).  To help prevent nausea and vomiting after your treatment, we encourage you to take your nausea medication as directed.   If you develop nausea and vomiting that is not controlled by your nausea medication, call the clinic.   BELOW ARE SYMPTOMS THAT SHOULD BE REPORTED IMMEDIATELY:  *FEVER GREATER THAN 100.5 F  *CHILLS WITH OR WITHOUT FEVER  NAUSEA AND VOMITING THAT IS NOT CONTROLLED WITH YOUR NAUSEA MEDICATION  *UNUSUAL SHORTNESS OF BREATH  *UNUSUAL BRUISING OR BLEEDING  TENDERNESS IN MOUTH AND THROAT WITH OR WITHOUT PRESENCE OF ULCERS  *URINARY PROBLEMS  *BOWEL PROBLEMS  UNUSUAL RASH Items with * indicate a potential emergency and should be followed up as soon as possible.  Feel free to call the clinic should you have any questions or concerns. The clinic phone number is [phone].  Please show the [facility] at check-in to the Emergency Department and triage nurse.

## 2018-05-17 NOTE — Progress Notes (Signed)
I stopped by today in infusion to see how Anna Valentine was doing.  She is having no peripheral neuropathy.  She is tolerating the Abraxane with mild fatigue and minimal nausea, which she takes care of with Compazine.  She takes it for about 2 days intermittently after each dose.  She wanted me to look at her incision which has one small spot approximately a millimeter of apparent dehiscence.  This may be a stitch trying to come out.  This requires only observation  We are a little behind on her echocardiograms.  We will ask Dr. Aundra Dubin to follow-up.  I went ahead and entered her last for treatments together with visits to make sure we are not developing neuropathy towards the end of therapy

## 2018-05-17 NOTE — Telephone Encounter (Signed)
Scheduled appt per 11/7 sch message - pt to get an updated schedule in treatment area.

## 2018-05-17 NOTE — Progress Notes (Signed)
Per Dr. Jana Hakim, ok to treat with ANC 1.1 and EF from stress test on 12/20/17.  Echo will be scheduled.

## 2018-05-17 NOTE — Patient Instructions (Signed)
Implanted Port Home Guide An implanted port is a type of central line that is placed under the skin. Central lines are used to provide IV access when treatment or nutrition needs to be given through a person's veins. Implanted ports are used for long-term IV access. An implanted port may be placed because:  You need IV medicine that would be irritating to the small veins in your hands or arms.  You need long-term IV medicines, such as antibiotics.  You need IV nutrition for a long period.  You need frequent blood draws for lab tests.  You need dialysis.  Implanted ports are usually placed in the chest area, but they can also be placed in the upper arm, the abdomen, or the leg. An implanted port has two main parts:  Reservoir. The reservoir is round and will appear as a small, raised area under your skin. The reservoir is the part where a needle is inserted to give medicines or draw blood.  Catheter. The catheter is a thin, flexible tube that extends from the reservoir. The catheter is placed into a large vein. Medicine that is inserted into the reservoir goes into the catheter and then into the vein.  How will I care for my incision site? Do not get the incision site wet. Bathe or shower as directed by your health care provider. How is my port accessed? Special steps must be taken to access the port:  Before the port is accessed, a numbing cream can be placed on the skin. This helps numb the skin over the port site.  Your health care provider uses a sterile technique to access the port. ? Your health care provider must put on a mask and sterile gloves. ? The skin over your port is cleaned carefully with an antiseptic and allowed to dry. ? The port is gently pinched between sterile gloves, and a needle is inserted into the port.  Only "non-coring" port needles should be used to access the port. Once the port is accessed, a blood return should be checked. This helps ensure that the port  is in the vein and is not clogged.  If your port needs to remain accessed for a constant infusion, a clear (transparent) bandage will be placed over the needle site. The bandage and needle will need to be changed every week, or as directed by your health care provider.  Keep the bandage covering the needle clean and dry. Do not get it wet. Follow your health care provider's instructions on how to take a shower or bath while the port is accessed.  If your port does not need to stay accessed, no bandage is needed over the port.  What is flushing? Flushing helps keep the port from getting clogged. Follow your health care provider's instructions on how and when to flush the port. Ports are usually flushed with saline solution or a medicine called heparin. The need for flushing will depend on how the port is used.  If the port is used for intermittent medicines or blood draws, the port will need to be flushed: ? After medicines have been given. ? After blood has been drawn. ? As part of routine maintenance.  If a constant infusion is running, the port may not need to be flushed.  How long will my port stay implanted? The port can stay in for as long as your health care provider thinks it is needed. When it is time for the port to come out, surgery will be   done to remove it. The procedure is similar to the one performed when the port was put in. When should I seek immediate medical care? When you have an implanted port, you should seek immediate medical care if:  You notice a bad smell coming from the incision site.  You have swelling, redness, or drainage at the incision site.  You have more swelling or pain at the port site or the surrounding area.  You have a fever that is not controlled with medicine.  This information is not intended to replace advice given to you by your health care provider. Make sure you discuss any questions you have with your health care provider. Document  Released: 06/27/2005 Document Revised: 12/03/2015 Document Reviewed: 03/04/2013 Elsevier Interactive Patient Education  2017 Elsevier Inc.  

## 2018-05-18 DIAGNOSIS — H43393 Other vitreous opacities, bilateral: Secondary | ICD-10-CM | POA: Diagnosis not present

## 2018-05-18 DIAGNOSIS — H43813 Vitreous degeneration, bilateral: Secondary | ICD-10-CM | POA: Diagnosis not present

## 2018-05-18 DIAGNOSIS — H40013 Open angle with borderline findings, low risk, bilateral: Secondary | ICD-10-CM | POA: Diagnosis not present

## 2018-05-18 DIAGNOSIS — H40051 Ocular hypertension, right eye: Secondary | ICD-10-CM | POA: Diagnosis not present

## 2018-05-21 ENCOUNTER — Other Ambulatory Visit: Payer: Self-pay | Admitting: *Deleted

## 2018-05-21 ENCOUNTER — Encounter: Payer: Medicare Other | Admitting: Physical Therapy

## 2018-05-22 ENCOUNTER — Other Ambulatory Visit: Payer: Self-pay | Admitting: Oncology

## 2018-05-23 ENCOUNTER — Ambulatory Visit: Payer: Medicare Other

## 2018-05-23 DIAGNOSIS — M25611 Stiffness of right shoulder, not elsewhere classified: Secondary | ICD-10-CM | POA: Diagnosis not present

## 2018-05-23 DIAGNOSIS — R222 Localized swelling, mass and lump, trunk: Secondary | ICD-10-CM | POA: Diagnosis not present

## 2018-05-23 DIAGNOSIS — Z483 Aftercare following surgery for neoplasm: Secondary | ICD-10-CM

## 2018-05-23 DIAGNOSIS — M25612 Stiffness of left shoulder, not elsewhere classified: Secondary | ICD-10-CM | POA: Diagnosis not present

## 2018-05-23 NOTE — Therapy (Signed)
[place], [place], [address] Phone: [phone]   Fax:  [phone]  Physical Therapy Treatment  Patient Details  Name: Anna Valentine MRN: [id number] Date of Birth: 22-Jun-1951 Referring Provider (PT): Dr. Donne Hazel    Encounter Date: 05/23/2018  PT End of Session - 05/23/18 1023    Visit Number  11    Number of Visits  13    Date for PT Re-Evaluation  06/13/18    PT Start Time  0934    PT Stop Time  1023    PT Time Calculation (min)  49 min    Activity Tolerance  Patient tolerated treatment well    Behavior During Therapy  [facility] for tasks assessed/performed       Past Medical History:  Diagnosis Date  . Anemia   . BRCA negative 2011   BRCA I/ II negative  . Breast cancer ([facility]) 08/2009   stage 2, rx with lumpectomy and xrt  . COPD (chronic obstructive pulmonary disease) (HCC)    pt.unsure of diagnosis status  . Emphysema of lung ([place])    pt. questions diagnosis  . Family history of adverse reaction to anesthesia    My Sister has nausea  . Family history of breast cancer   . Family history of colon cancer   . Family history of lung cancer   . GERD (gastroesophageal reflux disease)    not presently having symptoms  . Lung cancer ([place]) 06/06/05   stage 1 poorly differentiated adenocarcinoma, s/p right lower lobectomy.  . Osteoporosis   . Personal history of lung cancer   . STD (sexually transmitted disease)    HSV    Past Surgical History:  Procedure Laterality Date  . BREAST BIOPSY    . BREAST LUMPECTOMY  10/12/2009   Left lumpectomy and radiation, stage II, ER/PR+, Her 2 nu negative  . BUNIONECTOMY    . COLONOSCOPY    . LOBECTOMY  06/06/2005   RLL  . MASTECTOMY W/ SENTINEL NODE BIOPSY Bilateral 03/15/2018  . MASTECTOMY W/ SENTINEL NODE BIOPSY Bilateral 03/15/2018   Procedure: BILATERAL TOTAL MASTECTOMIES WITH RIGHT SENTINEL LYMPH NODE BIOPSY;  Surgeon: Rolm Bookbinder, MD;   Location: [facility];  Service: General;  Laterality: Bilateral;  . PORTACATH PLACEMENT N/A 03/15/2018   Procedure: INSERTION PORT-A-CATH WITH [place];  Surgeon: Rolm Bookbinder, MD;  Location: [facility];  Service: General;  Laterality: N/A;  . TONSILLECTOMY    . TUBAL LIGATION  1984    There were no vitals filed for this visit.  Subjective Assessment - 05/23/18 0939    Subjective  I've been wearing the chip pack and Ithink it helped some but I also think the swelling at my Lt chest is my tissue from old radiation.     Pertinent History   New right breast cancer diagnosed on 01/25/2018.  She underwent a bilateral mastectomy wit 6 lymph nodes removed on the right on 03/15/2018. 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  She also had a port placed on the right side at that time.   Pt had previous left breast cancer 2008 with sentinel node removal and radiation, lung cancer in 2006 She has history of frozen shoulder on the right that has resolved and the start of frozen shoulder on the left that resolved with radiation.  She has LFS .    Patient Stated Goals  to keep a good posture and get rid  of the pain     Currently in Pain?  No/denies         [facility] PT Assessment - 05/23/18 0001      AROM   Right Shoulder Flexion  166 Degrees    Right Shoulder ABduction  172 Degrees    Left Shoulder Flexion  155 Degrees    Left Shoulder ABduction  172 Degrees                   [facility] Adult PT Treatment/Exercise - 05/23/18 0001      Manual Therapy   Myofascial Release  To axilla, chest and incision supine to tolerance bilaterally, but being mindful of new opening noted at mid Rt incision where pt did have a scar.     Manual Lymphatic Drainage (MLD)  Briefly to Lt chest wall superior to incision at area of swelling.     Passive ROM  In Supine to bil shoulders into flexion, abduction and D2 to pts tolerance and pt over towel roll along T-spine between scapulae                    PT Long Term Goals - 05/16/18 1123      PT LONG TERM GOAL #1   Title  Pt will report pain is reduced by 50%    Baseline  Pt reports pain mcu improved but did not rate today-04/24/18; 95% improvement-05/03/18    Status  Achieved      PT LONG TERM GOAL #2   Title  Pt will have 150 degrees of bilateral shoulder abduction so that she can return to all household activities at home     Baseline  Rt 155 and Lt 149 degrees-04/24/18; Rt 164 and Lt 160 degrees-05/03/18; Rt 171 and Lt 169- 05/16/18    Status  Achieved      PT LONG TERM GOAL #3   Title  Pt will reduce quick DASH score to < 10 indicating a functional improvment of UEs    Baseline  27.27 on 04/04/2018; 13.64- 05/16/18    Status  Partially Met      PT LONG TERM GOAL #4   Title  Pt will verbalize understanding of lymphedema risk reduction practices     Baseline  Pt attended ABC class and has no further questions regarding lymphedmea risk reduction-04/24/18    Status  Achieved      PT LONG TERM GOAL #5   Title  Pt to report perception of bil axillary tightness improved by 80% to allow increased ease with reaching for ADLs    Baseline  40% -05/16/18    Time  4    Period  Weeks    Status  New            Plan - 05/23/18 1024    Clinical Impression Statement  Pt continues to show improvement with her A/ROM measurements and reports tightness is improving with ADLs and reaching. The opening at her Rt chest has since been assessed by Dr. Jana Hakim and he was not concerned and just instructed her to keep area clean and dry and to avoid hydrogen peroxide. Today area is beginning to scab over and under layer of dermis is no linger visible. Did not perform myofascial release to Rt chest, focused on axilla. She reports has progressed to 3 lb weights at home going by our "low and slow" progression and reports no problems with this. It will be most benefical to pt to cont focusing  on decreasing her end ROM tightness at  bil shoulders/axillae for last 2 sessions as she is independent with HEP for strength.     Rehab Potential  Excellent    Clinical Impairments Affecting Rehab Potential  recent surgery    PT Frequency  1x / week    PT Duration  4 weeks    PT Treatment/Interventions  ADLs/Self Care Home Management;Manual techniques;Orthotic Fit/Training;Patient/family education;Manual lymph drainage;Compression bandaging;Scar mobilization;Passive range of motion    PT Next Visit Plan  Focus on end ROM to bil shoulders and manual therapy to promote this to bil upper quadrants. Assess Rt mid mastectomy incision where scabbed over today being mindful of area with myofascial release.     Consulted and Agree with Plan of Care  Patient       Patient will benefit from skilled therapeutic intervention in order to improve the following deficits and impairments:  Pain, Impaired sensation, Increased fascial restricitons, Decreased scar mobility, Postural dysfunction, Decreased range of motion, Decreased strength, Impaired UE functional use, Increased edema  Visit Diagnosis: Stiffness of right shoulder, not elsewhere classified  Stiffness of left shoulder, not elsewhere classified  Aftercare following surgery for neoplasm  Localized swelling, mass and lump, trunk     Problem List Patient Active Problem List   Diagnosis Date Noted  . Port-A-Cath in place 04/26/2018  . Breast cancer, right ([place]) 03/15/2018  . Genetic testing 02/20/2018  . Family history of breast cancer   . Family history of lung cancer   . Personal history of lung cancer   . Chest pain 11/28/2017  . Abnormal CT of the chest 11/30/2016  . Malignant neoplasm of upper-inner quadrant of left breast in female, estrogen receptor positive ([place]) 06/16/2014  . Osteopenia 09/13/2013  . Postmenopausal atrophic vaginitis 09/13/2013  . Unspecified vitamin D deficiency 09/13/2013  . Malignant neoplasm of lower lobe of right lung ([place]) 07/24/2012  .  History of lung cancer 03/11/2011  . Anxiety 03/11/2011  . History of neutropenia 03/11/2011  . Family history of colon cancer 03/11/2011  . Cough 11/15/2010  . Bronchiectasis without acute exacerbation ([place]) 11/15/2010    Otelia Limes, PTA 05/23/2018, 11:18 AM  [place], [place], [address] Phone: [phone]   Fax:  [phone]  Name: Anna Valentine MRN: [id number] Date of Birth: 06-Jun-1951

## 2018-05-24 ENCOUNTER — Inpatient Hospital Stay: Payer: Medicare Other

## 2018-05-24 ENCOUNTER — Other Ambulatory Visit: Payer: Self-pay | Admitting: Oncology

## 2018-05-24 ENCOUNTER — Telehealth: Payer: Self-pay | Admitting: *Deleted

## 2018-05-24 VITALS — BP 153/84 | HR 71 | Temp 98.0°F | Resp 16 | Ht 66.0 in | Wt 117.0 lb

## 2018-05-24 DIAGNOSIS — Z5111 Encounter for antineoplastic chemotherapy: Secondary | ICD-10-CM | POA: Diagnosis not present

## 2018-05-24 DIAGNOSIS — C50212 Malignant neoplasm of upper-inner quadrant of left female breast: Secondary | ICD-10-CM | POA: Diagnosis not present

## 2018-05-24 DIAGNOSIS — Z5112 Encounter for antineoplastic immunotherapy: Secondary | ICD-10-CM | POA: Diagnosis not present

## 2018-05-24 DIAGNOSIS — Z170 Estrogen receptor positive status [ER+]: Secondary | ICD-10-CM | POA: Diagnosis not present

## 2018-05-24 DIAGNOSIS — L309 Dermatitis, unspecified: Secondary | ICD-10-CM | POA: Diagnosis not present

## 2018-05-24 DIAGNOSIS — Z87891 Personal history of nicotine dependence: Secondary | ICD-10-CM | POA: Diagnosis not present

## 2018-05-24 LAB — COMPREHENSIVE METABOLIC PANEL
ALBUMIN: 3.7 g/dL (ref 3.5–5.0)
ALK PHOS: 92 U/L (ref 38–126)
ALT: 19 U/L (ref 0–44)
ANION GAP: 7 (ref 5–15)
AST: 22 U/L (ref 15–41)
BUN: 11 mg/dL (ref 8–23)
CHLORIDE: 104 mmol/L (ref 98–111)
CO2: 28 mmol/L (ref 22–32)
CREATININE: 0.66 mg/dL (ref 0.44–1.00)
Calcium: 9.2 mg/dL (ref 8.9–10.3)
GFR calc Af Amer: >60 mL/min (ref >60)
GFR calc non Af Amer: >60 mL/min (ref >60)
GLUCOSE: 95 mg/dL (ref 70–99)
Potassium: 4.2 mmol/L (ref 3.5–5.1)
Sodium: 139 mmol/L (ref 135–145)
Total Bilirubin: 0.4 mg/dL (ref 0.3–1.2)
Total Protein: 6.7 g/dL (ref 6.5–8.1)

## 2018-05-24 LAB — CBC WITH DIFFERENTIAL/PLATELET
ABS IMMATURE GRANULOCYTES: 0.03 10*3/uL (ref 0.00–0.07)
BASOS ABS: 0 10*3/uL (ref 0.0–0.1)
Basophils Relative: 0 %
EOS ABS: 0.2 10*3/uL (ref 0.0–0.5)
Eosinophils Relative: 7 %
HEMATOCRIT: 30.7 % — AB (ref 36.0–46.0)
HEMOGLOBIN: 9.8 g/dL — AB (ref 12.0–15.0)
IMMATURE GRANULOCYTES: 1 %
LYMPHS ABS: 0.8 10*3/uL (ref 0.7–4.0)
LYMPHS PCT: 28 %
MCH: 30.6 pg (ref 26.0–34.0)
MCHC: 31.9 g/dL (ref 30.0–36.0)
MCV: 95.9 fL (ref 80.0–100.0)
MONOS PCT: 12 %
Monocytes Absolute: 0.3 10*3/uL (ref 0.1–1.0)
NEUTROS PCT: 52 %
Neutro Abs: 1.4 10*3/uL — ABNORMAL LOW (ref 1.7–7.7)
Platelets: 242 10*3/uL (ref 150–400)
RBC: 3.2 MIL/uL — ABNORMAL LOW (ref 3.87–5.11)
RDW: 14.2 % (ref 11.5–15.5)
WBC: 2.7 10*3/uL — ABNORMAL LOW (ref 4.0–10.5)
nRBC: 0 % (ref 0.0–0.2)

## 2018-05-24 MED ORDER — PACLITAXEL PROTEIN-BOUND CHEMO INJECTION 100 MG: 100.0000 mg/m2 | Freq: Once | Status: DC

## 2018-05-24 MED ORDER — ACETAMINOPHEN 325 MG PO TABS
ORAL_TABLET | ORAL | Status: AC
  Filled 2018-05-24: qty 2

## 2018-05-24 MED ORDER — SODIUM CHLORIDE 0.9% FLUSH
10.0000 mL | INTRAVENOUS | Status: DC | PRN
  Administered 2018-05-24: 10 mL
  Filled 2018-05-24: qty 10

## 2018-05-24 MED ORDER — ACETAMINOPHEN 325 MG PO TABS
650.0000 mg | ORAL_TABLET | Freq: Once | ORAL | Status: AC
  Administered 2018-05-24: 650 mg via ORAL

## 2018-05-24 MED ORDER — PACLITAXEL PROTEIN-BOUND CHEMO INJECTION 100 MG
100.0000 mg/m2 | Freq: Once | INTRAVENOUS | Status: AC
  Administered 2018-05-24: 150 mg via INTRAVENOUS
  Filled 2018-05-24: qty 30

## 2018-05-24 MED ORDER — TRASTUZUMAB CHEMO 150 MG IV SOLR
2.0000 mg/kg | Freq: Once | INTRAVENOUS | Status: AC
  Administered 2018-05-24: 105 mg via INTRAVENOUS
  Filled 2018-05-24: qty 5

## 2018-05-24 MED ORDER — SODIUM CHLORIDE 0.9 % IV SOLN
Freq: Once | INTRAVENOUS | Status: AC
  Administered 2018-05-24: 12:00:00 via INTRAVENOUS
  Filled 2018-05-24: qty 250

## 2018-05-24 MED ORDER — DIPHENHYDRAMINE HCL 25 MG PO CAPS
25.0000 mg | ORAL_CAPSULE | Freq: Once | ORAL | Status: AC
  Administered 2018-05-24: 25 mg via ORAL

## 2018-05-24 MED ORDER — PROCHLORPERAZINE MALEATE 10 MG PO TABS
10.0000 mg | ORAL_TABLET | Freq: Once | ORAL | Status: AC
  Administered 2018-05-24: 10 mg via ORAL

## 2018-05-24 MED ORDER — HEPARIN SOD (PORK) LOCK FLUSH 100 UNIT/ML IV SOLN
500.0000 [IU] | Freq: Once | INTRAVENOUS | Status: AC | PRN
  Administered 2018-05-24: 500 [IU]
  Filled 2018-05-24: qty 5

## 2018-05-24 MED ORDER — DIPHENHYDRAMINE HCL 25 MG PO CAPS
ORAL_CAPSULE | ORAL | Status: AC
  Filled 2018-05-24: qty 1

## 2018-05-24 MED ORDER — PROCHLORPERAZINE MALEATE 10 MG PO TABS
ORAL_TABLET | ORAL | Status: AC
  Filled 2018-05-24: qty 1

## 2018-05-24 NOTE — Progress Notes (Signed)
MD stopped by infusion to see patient last week - 05/17/18. Ok for next MD visit to be in December.  Hardie Pulley, PharmD, BCPS, BCOP

## 2018-05-24 NOTE — Telephone Encounter (Signed)
Ok to treat with ANC 1.4 - weekly herceptin and abraxane per MD review.

## 2018-05-24 NOTE — Patient Instructions (Addendum)
[facility] Discharge Instructions for Patients Receiving Chemotherapy  Today you received the following chemotherapy agents Trastuzumab (Herceptin) & Paclitaxel (Abraxane).  To help prevent nausea and vomiting after your treatment, we encourage you to take your nausea medication as prescribed.   If you develop nausea and vomiting that is not controlled by your nausea medication, call the clinic.   BELOW ARE SYMPTOMS THAT SHOULD BE REPORTED IMMEDIATELY:  *FEVER GREATER THAN 100.5 F  *CHILLS WITH OR WITHOUT FEVER  NAUSEA AND VOMITING THAT IS NOT CONTROLLED WITH YOUR NAUSEA MEDICATION  *UNUSUAL SHORTNESS OF BREATH  *UNUSUAL BRUISING OR BLEEDING  TENDERNESS IN MOUTH AND THROAT WITH OR WITHOUT PRESENCE OF ULCERS  *URINARY PROBLEMS  *BOWEL PROBLEMS  UNUSUAL RASH Items with * indicate a potential emergency and should be followed up as soon as possible.  Feel free to call the clinic should you have any questions or concerns. The clinic phone number is [phone].  Please show the [facility] at check-in to the Emergency Department and triage nurse.

## 2018-05-28 ENCOUNTER — Telehealth: Payer: Self-pay | Admitting: *Deleted

## 2018-05-28 NOTE — Telephone Encounter (Signed)
Pt called & states that she talked with Val RN last week about skin itching & was told to try benadryl cream & cortisone cream which has helped but she states it's not a rash, not hives but she has more areas that have popped up on her elbow, legs, & back.  This started after treatment on 05/17/18.  She reports they are red & round & one is crusty & one near elbow may be pustule.  She would like someone to look at these.  She will come for treatment again 11/20 & will have someone look at these then.  She knows to tell RN so that Dr Olivia Mackie, or Sandi Mealy can view.

## 2018-05-30 ENCOUNTER — Ambulatory Visit: Payer: Medicare Other

## 2018-05-30 DIAGNOSIS — Z483 Aftercare following surgery for neoplasm: Secondary | ICD-10-CM | POA: Diagnosis not present

## 2018-05-30 DIAGNOSIS — M25611 Stiffness of right shoulder, not elsewhere classified: Secondary | ICD-10-CM

## 2018-05-30 DIAGNOSIS — M25612 Stiffness of left shoulder, not elsewhere classified: Secondary | ICD-10-CM | POA: Diagnosis not present

## 2018-05-30 DIAGNOSIS — R222 Localized swelling, mass and lump, trunk: Secondary | ICD-10-CM | POA: Diagnosis not present

## 2018-05-30 NOTE — Therapy (Signed)
[place], [place], [address] Phone: [phone]   Fax:  [phone]  Physical Therapy Treatment  Patient Details  Name: Anna Valentine MRN: [id number] Date of Birth: 01-24-1951 Referring Provider (PT): Dr. Donne Hazel    Encounter Date: 05/30/2018  PT End of Session - 05/30/18 1015    Visit Number  12    Number of Visits  13    Date for PT Re-Evaluation  06/13/18    PT Start Time  0936    PT Stop Time  1015    PT Time Calculation (min)  39 min    Activity Tolerance  Patient tolerated treatment well    Behavior During Therapy  [facility] for tasks assessed/performed       Past Medical History:  Diagnosis Date  . Anemia   . BRCA negative 2011   BRCA I/ II negative  . Breast cancer ([place]) 08/2009   stage 2, rx with lumpectomy and xrt  . COPD (chronic obstructive pulmonary disease) (HCC)    pt.unsure of diagnosis status  . Emphysema of lung ([place])    pt. questions diagnosis  . Family history of adverse reaction to anesthesia    My Sister has nausea  . Family history of breast cancer   . Family history of colon cancer   . Family history of lung cancer   . GERD (gastroesophageal reflux disease)    not presently having symptoms  . Lung cancer ([place]) 06/06/05   stage 1 poorly differentiated adenocarcinoma, s/p right lower lobectomy.  . Osteoporosis   . Personal history of lung cancer   . STD (sexually transmitted disease)    HSV    Past Surgical History:  Procedure Laterality Date  . BREAST BIOPSY    . BREAST LUMPECTOMY  10/12/2009   Left lumpectomy and radiation, stage II, ER/PR+, Her 2 nu negative  . BUNIONECTOMY    . COLONOSCOPY    . LOBECTOMY  06/06/2005   RLL  . MASTECTOMY W/ SENTINEL NODE BIOPSY Bilateral 03/15/2018  . MASTECTOMY W/ SENTINEL NODE BIOPSY Bilateral 03/15/2018   Procedure: BILATERAL TOTAL MASTECTOMIES WITH RIGHT SENTINEL LYMPH NODE BIOPSY;  Surgeon: Rolm Bookbinder, MD;   Location: [place];  Service: General;  Laterality: Bilateral;  . PORTACATH PLACEMENT N/A 03/15/2018   Procedure: INSERTION PORT-A-CATH WITH [place];  Surgeon: Rolm Bookbinder, MD;  Location: [place];  Service: General;  Laterality: N/A;  . TONSILLECTOMY    . TUBAL LIGATION  1984    There were no vitals filed for this visit.  Subjective Assessment - 05/30/18 1015    Subjective  I think the swelling at my Lt chest is going down and softening a little. My end ROM tightness is still present though seems to be improving. It really helps when you stretch me.    Pertinent History   New right breast cancer diagnosed on 01/25/2018.  She underwent a bilateral mastectomy wit 6 lymph nodes removed on the right on 03/15/2018. 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  She also had a port placed on the right side at that time.   Pt had previous left breast cancer 2008 with sentinel node removal and radiation, lung cancer in 2006 She has history of frozen shoulder on the right that has resolved and the start of frozen shoulder on the left that resolved with radiation.  She has LFS .    Patient Stated Goals  to keep  a good posture and get rid of the pain     Currently in Pain?  No/denies                       [facility] Adult PT Treatment/Exercise - 05/30/18 0001      Manual Therapy   Myofascial Release  To axilla, chest and incision in supine to tolerance bilaterally, Rt mastectomy incision appears to have healed well since last treatment, no opening noted today    Passive ROM  In Supine to bil shoulders into flexion, abduction and D2 to pts tolerance                   PT Long Term Goals - 05/16/18 1123      PT LONG TERM GOAL #1   Title  Pt will report pain is reduced by 50%    Baseline  Pt reports pain mcu improved but did not rate today-04/24/18; 95% improvement-05/03/18    Status  Achieved      PT LONG TERM GOAL #2   Title  Pt will have 150  degrees of bilateral shoulder abduction so that she can return to all household activities at home     Baseline  Rt 155 and Lt 149 degrees-04/24/18; Rt 164 and Lt 160 degrees-05/03/18; Rt 171 and Lt 169- 05/16/18    Status  Achieved      PT LONG TERM GOAL #3   Title  Pt will reduce quick DASH score to < 10 indicating a functional improvment of UEs    Baseline  27.27 on 04/04/2018; 13.64- 05/16/18    Status  Partially Met      PT LONG TERM GOAL #4   Title  Pt will verbalize understanding of lymphedema risk reduction practices     Baseline  Pt attended ABC class and has no further questions regarding lymphedmea risk reduction-04/24/18    Status  Achieved      PT LONG TERM GOAL #5   Title  Pt to report perception of bil axillary tightness improved by 80% to allow increased ease with reaching for ADLs    Baseline  40% -05/16/18    Time  4    Period  Weeks    Status  New            Plan - 05/30/18 1034    Clinical Impression Statement  Pt continues to demonstrate tightness at bil end ROM of axillae at pectoralis insertion, with Lt>Rt. She continues to benefit from manual therapy as she reports this decreases her overall feelings of tightness across her chest and at end ROM's of bil shoulders. In addition, she is doing well with independence of HEP as well reporting this is aiding her overall recovery. Pt will be ready for D/C in next 1-2 visits.     Rehab Potential  Excellent    Clinical Impairments Affecting Rehab Potential  recent surgery    PT Frequency  1x / week    PT Duration  4 weeks    PT Treatment/Interventions  ADLs/Self Care Home Management;Manual techniques;Orthotic Fit/Training;Patient/family education;Manual lymph drainage;Compression bandaging;Scar mobilization;Passive range of motion    PT Next Visit Plan  D/C in next 1-2 visits. Reassess goals and ROM next and see how pt is doing with HEP, any questions (?) in anticipation of upcoming D/C. Cont manual therapy focusing on  improving bil pectoralis tissue tightness at her end shoulder ROM's    Consulted and Agree with Plan of Care  Patient       Patient will benefit from skilled therapeutic intervention in order to improve the following deficits and impairments:  Pain, Impaired sensation, Increased fascial restricitons, Decreased scar mobility, Postural dysfunction, Decreased range of motion, Decreased strength, Impaired UE functional use, Increased edema  Visit Diagnosis: Stiffness of right shoulder, not elsewhere classified  Stiffness of left shoulder, not elsewhere classified  Aftercare following surgery for neoplasm  Localized swelling, mass and lump, trunk     Problem List Patient Active Problem List   Diagnosis Date Noted  . Port-A-Cath in place 04/26/2018  . Breast cancer, right ([place]) 03/15/2018  . Genetic testing 02/20/2018  . Family history of breast cancer   . Family history of lung cancer   . Personal history of lung cancer   . Chest pain 11/28/2017  . Abnormal CT of the chest 11/30/2016  . Malignant neoplasm of upper-inner quadrant of left breast in female, estrogen receptor positive ([place]) 06/16/2014  . Osteopenia 09/13/2013  . Postmenopausal atrophic vaginitis 09/13/2013  . Unspecified vitamin D deficiency 09/13/2013  . Malignant neoplasm of lower lobe of right lung ([place]) 07/24/2012  . History of lung cancer 03/11/2011  . Anxiety 03/11/2011  . History of neutropenia 03/11/2011  . Family history of colon cancer 03/11/2011  . Cough 11/15/2010  . Bronchiectasis without acute exacerbation ([place]) 11/15/2010    Otelia Limes, PTA 05/30/2018, 10:40 AM  [place], [place], [address] Phone: [phone]   Fax:  [phone]  Name: Anna Valentine MRN: [id number] Date of Birth: [birth year]

## 2018-05-31 ENCOUNTER — Inpatient Hospital Stay (HOSPITAL_BASED_OUTPATIENT_CLINIC_OR_DEPARTMENT_OTHER): Payer: Medicare Other | Admitting: Medical

## 2018-05-31 ENCOUNTER — Inpatient Hospital Stay: Payer: Medicare Other

## 2018-05-31 ENCOUNTER — Other Ambulatory Visit: Payer: Self-pay | Admitting: Oncology

## 2018-05-31 ENCOUNTER — Other Ambulatory Visit: Payer: Self-pay | Admitting: Medical

## 2018-05-31 ENCOUNTER — Other Ambulatory Visit: Payer: Medicare Other

## 2018-05-31 ENCOUNTER — Ambulatory Visit: Payer: Medicare Other

## 2018-05-31 VITALS — BP 165/84 | HR 79 | Temp 97.7°F | Resp 14

## 2018-05-31 DIAGNOSIS — Z87891 Personal history of nicotine dependence: Secondary | ICD-10-CM

## 2018-05-31 DIAGNOSIS — Z95828 Presence of other vascular implants and grafts: Secondary | ICD-10-CM

## 2018-05-31 DIAGNOSIS — C50212 Malignant neoplasm of upper-inner quadrant of left female breast: Secondary | ICD-10-CM

## 2018-05-31 DIAGNOSIS — Z170 Estrogen receptor positive status [ER+]: Secondary | ICD-10-CM

## 2018-05-31 DIAGNOSIS — L309 Dermatitis, unspecified: Secondary | ICD-10-CM

## 2018-05-31 DIAGNOSIS — Z803 Family history of malignant neoplasm of breast: Secondary | ICD-10-CM

## 2018-05-31 DIAGNOSIS — Z5111 Encounter for antineoplastic chemotherapy: Secondary | ICD-10-CM | POA: Diagnosis not present

## 2018-05-31 DIAGNOSIS — Z5112 Encounter for antineoplastic immunotherapy: Secondary | ICD-10-CM | POA: Diagnosis not present

## 2018-05-31 LAB — COMPREHENSIVE METABOLIC PANEL
ALBUMIN: 3.8 g/dL (ref 3.5–5.0)
ALT: 16 U/L (ref 0–44)
ANION GAP: 9 (ref 5–15)
AST: 23 U/L (ref 15–41)
Alkaline Phosphatase: 84 U/L (ref 38–126)
BUN: 16 mg/dL (ref 8–23)
CHLORIDE: 105 mmol/L (ref 98–111)
CO2: 26 mmol/L (ref 22–32)
Calcium: 9.3 mg/dL (ref 8.9–10.3)
Creatinine, Ser: 0.7 mg/dL (ref 0.44–1.00)
GFR calc non Af Amer: >60 mL/min (ref >60)
Glucose, Bld: 91 mg/dL (ref 70–99)
POTASSIUM: 4.2 mmol/L (ref 3.5–5.1)
SODIUM: 140 mmol/L (ref 135–145)
Total Bilirubin: 0.3 mg/dL (ref 0.3–1.2)
Total Protein: 6.8 g/dL (ref 6.5–8.1)

## 2018-05-31 LAB — CBC WITH DIFFERENTIAL/PLATELET
Abs Immature Granulocytes: 0.03 10*3/uL (ref 0.00–0.07)
BASOS ABS: 0 10*3/uL (ref 0.0–0.1)
BASOS PCT: 0 %
Eosinophils Absolute: 0.1 10*3/uL (ref 0.0–0.5)
Eosinophils Relative: 6 %
HCT: 30.5 % — ABNORMAL LOW (ref 36.0–46.0)
Hemoglobin: 9.8 g/dL — ABNORMAL LOW (ref 12.0–15.0)
Immature Granulocytes: 1 %
Lymphocytes Relative: 34 %
Lymphs Abs: 0.8 10*3/uL (ref 0.7–4.0)
MCH: 30.6 pg (ref 26.0–34.0)
MCHC: 32.1 g/dL (ref 30.0–36.0)
MCV: 95.3 fL (ref 80.0–100.0)
MONO ABS: 0.3 10*3/uL (ref 0.1–1.0)
Monocytes Relative: 12 %
NRBC: 0 % (ref 0.0–0.2)
Neutro Abs: 1.1 10*3/uL — ABNORMAL LOW (ref 1.7–7.7)
Neutrophils Relative %: 47 %
PLATELETS: 255 10*3/uL (ref 150–400)
RBC: 3.2 MIL/uL — AB (ref 3.87–5.11)
RDW: 14.4 % (ref 11.5–15.5)
WBC: 2.3 10*3/uL — AB (ref 4.0–10.5)

## 2018-05-31 MED ORDER — PROCHLORPERAZINE MALEATE 10 MG PO TABS
ORAL_TABLET | ORAL | Status: AC
  Filled 2018-05-31: qty 1

## 2018-05-31 MED ORDER — PACLITAXEL PROTEIN-BOUND CHEMO INJECTION 100 MG
100.0000 mg/m2 | Freq: Once | INTRAVENOUS | Status: AC
  Administered 2018-05-31: 150 mg via INTRAVENOUS
  Filled 2018-05-31: qty 30

## 2018-05-31 MED ORDER — SODIUM CHLORIDE 0.9% FLUSH
10.0000 mL | Freq: Once | INTRAVENOUS | Status: AC
  Administered 2018-05-31: 10 mL
  Filled 2018-05-31: qty 10

## 2018-05-31 MED ORDER — TRASTUZUMAB CHEMO 150 MG IV SOLR
2.0000 mg/kg | Freq: Once | INTRAVENOUS | Status: AC
  Administered 2018-05-31: 105 mg via INTRAVENOUS
  Filled 2018-05-31: qty 5

## 2018-05-31 MED ORDER — HEPARIN SOD (PORK) LOCK FLUSH 100 UNIT/ML IV SOLN
500.0000 [IU] | Freq: Once | INTRAVENOUS | Status: AC | PRN
  Administered 2018-05-31: 500 [IU]
  Filled 2018-05-31: qty 5

## 2018-05-31 MED ORDER — ACETAMINOPHEN 325 MG PO TABS
650.0000 mg | ORAL_TABLET | Freq: Once | ORAL | Status: AC
  Administered 2018-05-31: 650 mg via ORAL

## 2018-05-31 MED ORDER — ACETAMINOPHEN 325 MG PO TABS
ORAL_TABLET | ORAL | Status: AC
  Filled 2018-05-31: qty 2

## 2018-05-31 MED ORDER — PREDNISONE 5 MG PO TABS: ORAL_TABLET | ORAL | 0 refills | Status: DC

## 2018-05-31 MED ORDER — PROCHLORPERAZINE MALEATE 10 MG PO TABS
10.0000 mg | ORAL_TABLET | Freq: Once | ORAL | Status: AC
  Administered 2018-05-31: 10 mg via ORAL

## 2018-05-31 MED ORDER — DIPHENHYDRAMINE HCL 25 MG PO CAPS
ORAL_CAPSULE | ORAL | Status: AC
  Filled 2018-05-31: qty 1

## 2018-05-31 MED ORDER — DIPHENHYDRAMINE HCL 25 MG PO CAPS
25.0000 mg | ORAL_CAPSULE | Freq: Once | ORAL | Status: AC
  Administered 2018-05-31: 25 mg via ORAL

## 2018-05-31 MED ORDER — SODIUM CHLORIDE 0.9% FLUSH
10.0000 mL | INTRAVENOUS | Status: DC | PRN
  Administered 2018-05-31: 10 mL
  Filled 2018-05-31: qty 10

## 2018-05-31 MED ORDER — SODIUM CHLORIDE 0.9 % IV SOLN
Freq: Once | INTRAVENOUS | Status: AC
  Administered 2018-05-31: 13:00:00 via INTRAVENOUS
  Filled 2018-05-31: qty 250

## 2018-05-31 MED ORDER — TRIAMCINOLONE ACETONIDE 0.025 % EX CREA: 1.0000 "application " | TOPICAL_CREAM | Freq: Four times a day (QID) | CUTANEOUS | 2 refills | Status: DC

## 2018-05-31 MED FILL — predniSONE 5 MG TABS: 5 | 7 days supply | Qty: 28 | Fill #0

## 2018-05-31 MED FILL — TRIAMCINOLONE 0.025% CREAM: 0.025 | 20 days supply | Qty: 80 | Fill #0

## 2018-05-31 NOTE — Patient Instructions (Signed)
[facility] Discharge Instructions for Patients Receiving Chemotherapy  Today you received the following chemotherapy agents Trastuzumab (Herceptin) & Paclitaxel (Abraxane).  To help prevent nausea and vomiting after your treatment, we encourage you to take your nausea medication as prescribed.   If you develop nausea and vomiting that is not controlled by your nausea medication, call the clinic.   BELOW ARE SYMPTOMS THAT SHOULD BE REPORTED IMMEDIATELY:  *FEVER GREATER THAN 100.5 F  *CHILLS WITH OR WITHOUT FEVER  NAUSEA AND VOMITING THAT IS NOT CONTROLLED WITH YOUR NAUSEA MEDICATION  *UNUSUAL SHORTNESS OF BREATH  *UNUSUAL BRUISING OR BLEEDING  TENDERNESS IN MOUTH AND THROAT WITH OR WITHOUT PRESENCE OF ULCERS  *URINARY PROBLEMS  *BOWEL PROBLEMS  UNUSUAL RASH Items with * indicate a potential emergency and should be followed up as soon as possible.  Feel free to call the clinic should you have any questions or concerns. The clinic phone number is [phone].  Please show the [facility] at check-in to the Emergency Department and triage nurse.

## 2018-05-31 NOTE — Progress Notes (Signed)
Pt. Reports that her rash has increased since her last visit, but the ointment that was recommended from Val has removed any  itching, which has also kept the skin intact. There is a new red patch on her left leg, the size of a quarter. She reports that most of the spots result in some sort of scab before they reside.

## 2018-05-31 NOTE — Progress Notes (Signed)
Per Dr. Jana Hakim, ok to treat with ANC 1.1.  Sandi Mealy, PA, consulted to assess patient in regards to her rash.   Per Dr Jana Hakim, [facility] to treat with EF from 12/2017.  Val, RN will schedule echo.

## 2018-06-05 DIAGNOSIS — A319 Mycobacterial infection, unspecified: Secondary | ICD-10-CM | POA: Diagnosis not present

## 2018-06-05 DIAGNOSIS — Z923 Personal history of irradiation: Secondary | ICD-10-CM | POA: Diagnosis not present

## 2018-06-05 DIAGNOSIS — Z08 Encounter for follow-up examination after completed treatment for malignant neoplasm: Secondary | ICD-10-CM | POA: Diagnosis not present

## 2018-06-05 DIAGNOSIS — Z87891 Personal history of nicotine dependence: Secondary | ICD-10-CM | POA: Diagnosis not present

## 2018-06-05 DIAGNOSIS — Z902 Acquired absence of lung [part of]: Secondary | ICD-10-CM | POA: Diagnosis not present

## 2018-06-05 DIAGNOSIS — J479 Bronchiectasis, uncomplicated: Secondary | ICD-10-CM | POA: Diagnosis not present

## 2018-06-05 DIAGNOSIS — Z85118 Personal history of other malignant neoplasm of bronchus and lung: Secondary | ICD-10-CM | POA: Diagnosis not present

## 2018-06-05 DIAGNOSIS — Z9013 Acquired absence of bilateral breasts and nipples: Secondary | ICD-10-CM | POA: Diagnosis not present

## 2018-06-05 DIAGNOSIS — C50911 Malignant neoplasm of unspecified site of right female breast: Secondary | ICD-10-CM | POA: Diagnosis not present

## 2018-06-05 DIAGNOSIS — R918 Other nonspecific abnormal finding of lung field: Secondary | ICD-10-CM | POA: Diagnosis not present

## 2018-06-05 DIAGNOSIS — C3431 Malignant neoplasm of lower lobe, right bronchus or lung: Secondary | ICD-10-CM | POA: Diagnosis not present

## 2018-06-05 NOTE — Progress Notes (Signed)
Symptoms Management Clinic Progress Note   Anna Valentine [id number] June 17, 1951 67 y.o.  Anna Valentine is managed by Dr. Jana Hakim  Actively treated with chemotherapy/immunotherapy/hormonal therapy: yes  Current Therapy: Abraxane and Herceptin  Last Treated: 05/31/2018 (cycle 2, day 8)  Assessment: Plan:    Eczema, unspecified type  Malignant neoplasm of upper-inner quadrant of left breast in female, estrogen receptor positive ([place])   Eczema: Patient has multiple irregular scaling lesions over her upper and lower extremities and trunk.  She was given a prescription for triamcinolone cream and a prednisone taper.  ER positive malignant neoplasm of the left breast: The patient continues to be followed by Dr. Jana Hakim and is receiving cycle 2, day 8 of Abraxane and Herceptin today.  She will return as scheduled.  Please see After Visit Summary for patient specific instructions.  Future Appointments  Date Time Provider [place]  06/08/2018  9:30 AM [facility] LAB 2 [facility] None  06/08/2018  9:45 AM [facility] [place] FLUSH [facility] None  06/08/2018 10:45 AM [facility] INFUSION [facility] None  06/11/2018  8:45 AM Debbe Bales, Valerie A, PTA [facility] None  06/14/2018  9:30 AM [facility] LAB 2 [facility] None  06/14/2018  9:45 AM [facility] [place] FLUSH [facility] None  06/14/2018 11:00 AM [facility] INFUSION [facility] None  06/18/2018  9:30 AM Collie Siad A, PTA [facility] None  06/19/2018  2:00 PM [place] ECHO 1-BUZZ [facility]  06/19/2018  3:00 PM Larey Dresser, MD [facility] None  06/21/2018  8:15 AM [facility] LAB 3 [facility] None  06/21/2018  8:30 AM [facility] [place] FLUSH [facility] None  06/21/2018  9:00 AM Magrinat, Virgie Dad, MD [facility] None  06/21/2018  9:30 AM [facility] INFUSION [facility] None  06/28/2018 12:15 PM [facility] LAB 1 [facility] None  06/28/2018 12:30 PM [facility] [place] FLUSH [facility] None  06/28/2018  1:00 PM Magrinat, Virgie Dad,  MD [facility] None  06/28/2018  1:45 PM [facility] INFUSION [facility] None  07/06/2018 10:15 AM [facility] LAB 2 [facility] None  07/06/2018 10:30 AM [facility] [place] FLUSH [facility] None  07/06/2018 11:00 AM Causey, Charlestine Massed, NP [facility] None  07/06/2018 12:00 PM [facility] INFUSION [facility] None  12/26/2018 11:30 AM [facility] LAB 4 [facility] None  12/26/2018 12:00 PM Magrinat, Virgie Dad, MD [facility] None    No orders of the defined types were placed in this encounter.      Subjective:   Patient ID:  Anna Valentine is a 67 y.o. (DOB 16-May-1951) female.  Chief Complaint: No chief complaint on file.   HPI Anna Valentine is a 67 year old female with a history of an ER positive malignant neoplasm of the left breast who is treated by Dr. Jana Hakim with adjuvant chemotherapy/immunotherapy of Abraxane and Herceptin.  She is receiving cycle 2, day 8 of therapy today.  She reports that she had a rash over her upper and lower extremities and torso for which she contacted Dr. Jana Hakim and was prescribed a topical cream.  She reports that her itching has resolved however she continues to have multiple areas of irregular lesions of varying size over the upper and lower extremities and torso.  The areas appear dry and are scaling.  She denies fevers, chills, sweats, chest tightness, or difficulty swallowing.  Medications: I have reviewed the patient's current medications.  Allergies:  Allergies  Allergen Reactions  . Clarithromycin Rash    Past Medical History:  Diagnosis Date  . Anemia   . BRCA negative 2011   BRCA I/ II negative  . Breast  cancer ([place]) 08/2009   stage 2, rx with lumpectomy and xrt  . COPD (chronic obstructive pulmonary disease) (HCC)    pt.unsure of diagnosis status  . Emphysema of lung ([place])    pt. questions diagnosis  . Family history of adverse reaction to anesthesia    My Sister has nausea  . Family history of breast cancer   . Family  history of colon cancer   . Family history of lung cancer   . GERD (gastroesophageal reflux disease)    not presently having symptoms  . Lung cancer ([place]) 06/06/05   stage 1 poorly differentiated adenocarcinoma, s/p right lower lobectomy.  . Osteoporosis   . Personal history of lung cancer   . STD (sexually transmitted disease)    HSV    Past Surgical History:  Procedure Laterality Date  . BREAST BIOPSY    . BREAST LUMPECTOMY  10/12/2009   Left lumpectomy and radiation, stage II, ER/PR+, Her 2 nu negative  . BUNIONECTOMY    . COLONOSCOPY    . LOBECTOMY  06/06/2005   RLL  . MASTECTOMY W/ SENTINEL NODE BIOPSY Bilateral 03/15/2018  . MASTECTOMY W/ SENTINEL NODE BIOPSY Bilateral 03/15/2018   Procedure: BILATERAL TOTAL MASTECTOMIES WITH RIGHT SENTINEL LYMPH NODE BIOPSY;  Surgeon: Rolm Bookbinder, MD;  Location: [facility];  Service: General;  Laterality: Bilateral;  . PORTACATH PLACEMENT N/A 03/15/2018   Procedure: INSERTION PORT-A-CATH WITH [place];  Surgeon: Rolm Bookbinder, MD;  Location: [facility];  Service: General;  Laterality: N/A;  . TONSILLECTOMY    . TUBAL LIGATION  1984    Family History  Problem Relation Age of Onset  . Allergies Mother   . Asthma Mother   . Lung cancer Mother   . Breast cancer Mother 19       recurrence age 54  . Colon cancer Father 36  . Prostate cancer Brother 19  . Breast cancer Sister 76       Recurrence age 20 BRCA negative  . Colon polyps Sister   . Breast cancer Sister 53  . Prostate cancer Brother 69  . Breast cancer Maternal Grandmother 80  . Colon cancer Maternal Aunt   . Leukemia Maternal Grandfather   . Lung cancer Maternal Aunt   . Breast cancer Cousin   . Esophageal cancer Neg Hx   . Rectal cancer Neg Hx   . Stomach cancer Neg Hx     Social History   Socioeconomic History  . Marital status: Married    Spouse name: Not on file  . Number of children: 0  . Years of education: Not on file  . Highest education level: Not on file    Occupational History  . Occupation: OFFICE MANAGER    Employer: [employer]  Social Needs  . Financial resource strain: Not on file  . Food insecurity:    Worry: Not on file    Inability: Not on file  . Transportation needs:    Medical: Not on file    Non-medical: Not on file  Tobacco Use  . Smoking status: Former Smoker    Packs/day: 2.00    Years: 18.00    Pack years: 36.00    Last attempt to quit: 07/12/1987    Years since quitting: 30.9  . Smokeless tobacco: Never Used  Substance and Sexual Activity  . Alcohol use: Yes    Alcohol/week: 4.0 standard drinks    Types: 4 Standard drinks or equivalent per week    Comment: social  .  Drug use: No  . Sexual activity: Yes  Lifestyle  . Physical activity:    Days per week: Not on file    Minutes per session: Not on file  . Stress: Not on file  Relationships  . Social connections:    Talks on phone: Not on file    Gets together: Not on file    Attends religious service: Not on file    Active member of club or organization: Not on file    Attends meetings of clubs or organizations: Not on file    Relationship status: Not on file  . Intimate partner violence:    Fear of current or ex partner: Not on file    Emotionally abused: Not on file    Physically abused: Not on file    Forced sexual activity: Not on file  Other Topics Concern  . Not on file  Social History Narrative  . Not on file    Past Medical History, Surgical history, Social history, and Family history were reviewed and updated as appropriate.   Please see review of systems for further details on the patient's review from today.   Review of Systems:  Review of Systems  Constitutional: Negative for chills, diaphoresis and fever.  HENT: Negative for facial swelling and trouble swallowing.   Respiratory: Negative for cough, chest tightness and shortness of breath.   Cardiovascular: Negative for chest pain.  Skin: Positive for rash.    Objective:    Physical Exam:  There were no vitals taken for this visit. ECOG: 0  Physical Exam  Constitutional: No distress.  HENT:  Head: Normocephalic and atraumatic.  Cardiovascular: Normal rate, regular rhythm and normal heart sounds. Exam reveals no gallop and no friction rub.  No murmur heard. Pulmonary/Chest: Effort normal and breath sounds normal. No respiratory distress. She has no wheezes. She has no rales.  Neurological: She is alert.  Skin: Skin is warm and dry. Rash noted. She is not diaphoretic. No erythema.  Multiple dry, irregular, and scaling lesions of varying sizes are noted over the upper and lower extremities and torso.    Lab Review:     Component Value Date/Time   NA 140 05/31/2018 1218   NA 139 12/22/2016 1141   K 4.2 05/31/2018 1218   K 4.2 12/22/2016 1141   CL 105 05/31/2018 1218   CL 103 11/22/2012 1025   CO2 26 05/31/2018 1218   CO2 28 12/22/2016 1141   GLUCOSE 91 05/31/2018 1218   GLUCOSE 92 12/22/2016 1141   GLUCOSE 94 11/22/2012 1025   BUN 16 05/31/2018 1218   BUN 13.2 12/22/2016 1141   CREATININE 0.70 05/31/2018 1218   CREATININE 0.64 11/07/2017 0906   CREATININE 0.7 12/22/2016 1141   CALCIUM 9.3 05/31/2018 1218   CALCIUM 9.7 12/22/2016 1141   PROT 6.8 05/31/2018 1218   PROT 7.0 12/22/2016 1141   ALBUMIN 3.8 05/31/2018 1218   ALBUMIN 3.8 12/22/2016 1141   AST 23 05/31/2018 1218   AST 18 12/22/2016 1141   ALT 16 05/31/2018 1218   ALT 10 12/22/2016 1141   ALKPHOS 84 05/31/2018 1218   ALKPHOS 81 12/22/2016 1141   BILITOT 0.3 05/31/2018 1218   BILITOT 0.65 12/22/2016 1141   GFRNONAA >60 05/31/2018 1218   GFRNONAA 93 11/07/2017 0906   GFRAA >60 05/31/2018 1218   GFRAA 108 11/07/2017 0906       Component Value Date/Time   WBC 2.3 (L) 05/31/2018 1218   RBC 3.20 (L) 05/31/2018 1218  HGB 9.8 (L) 05/31/2018 1218   HGB 11.9 12/22/2016 1141   HCT 30.5 (L) 05/31/2018 1218   HCT 35.8 12/22/2016 1141   PLT 255 05/31/2018 1218   PLT 275  12/22/2016 1141   MCV 95.3 05/31/2018 1218   MCV 93.2 12/22/2016 1141   MCH 30.6 05/31/2018 1218   MCHC 32.1 05/31/2018 1218   RDW 14.4 05/31/2018 1218   RDW 13.0 12/22/2016 1141   LYMPHSABS 0.8 05/31/2018 1218   LYMPHSABS 0.8 (L) 12/22/2016 1141   MONOABS 0.3 05/31/2018 1218   MONOABS 0.5 12/22/2016 1141   EOSABS 0.1 05/31/2018 1218   EOSABS 0.1 12/22/2016 1141   EOSABS 0.1 02/03/2010 1007   BASOSABS 0.0 05/31/2018 1218   BASOSABS 0.0 12/22/2016 1141   -------------------------------  Imaging from last 24 hours (if applicable):  Radiology interpretation: No results found.

## 2018-06-08 ENCOUNTER — Inpatient Hospital Stay: Payer: Medicare Other

## 2018-06-08 VITALS — BP 157/75 | HR 70 | Temp 97.7°F | Resp 16

## 2018-06-08 DIAGNOSIS — C50212 Malignant neoplasm of upper-inner quadrant of left female breast: Secondary | ICD-10-CM

## 2018-06-08 DIAGNOSIS — Z5111 Encounter for antineoplastic chemotherapy: Secondary | ICD-10-CM | POA: Diagnosis not present

## 2018-06-08 DIAGNOSIS — Z5112 Encounter for antineoplastic immunotherapy: Secondary | ICD-10-CM | POA: Diagnosis not present

## 2018-06-08 DIAGNOSIS — Z87891 Personal history of nicotine dependence: Secondary | ICD-10-CM | POA: Diagnosis not present

## 2018-06-08 DIAGNOSIS — L309 Dermatitis, unspecified: Secondary | ICD-10-CM | POA: Diagnosis not present

## 2018-06-08 DIAGNOSIS — Z170 Estrogen receptor positive status [ER+]: Secondary | ICD-10-CM | POA: Diagnosis not present

## 2018-06-08 LAB — CBC WITH DIFFERENTIAL/PLATELET
Abs Immature Granulocytes: 0.04 10*3/uL (ref 0.00–0.07)
BASOS ABS: 0 10*3/uL (ref 0.0–0.1)
BASOS PCT: 1 %
EOS ABS: 0.1 10*3/uL (ref 0.0–0.5)
EOS PCT: 2 %
HCT: 30.9 % — ABNORMAL LOW (ref 36.0–46.0)
Hemoglobin: 10.3 g/dL — ABNORMAL LOW (ref 12.0–15.0)
IMMATURE GRANULOCYTES: 1 %
LYMPHS ABS: 0.9 10*3/uL (ref 0.7–4.0)
Lymphocytes Relative: 27 %
MCH: 31.3 pg (ref 26.0–34.0)
MCHC: 33.3 g/dL (ref 30.0–36.0)
MCV: 93.9 fL (ref 80.0–100.0)
Monocytes Absolute: 0.4 10*3/uL (ref 0.1–1.0)
Monocytes Relative: 13 %
NEUTROS PCT: 56 %
NRBC: 0 % (ref 0.0–0.2)
Neutro Abs: 1.8 10*3/uL (ref 1.7–7.7)
PLATELETS: 275 10*3/uL (ref 150–400)
RBC: 3.29 MIL/uL — ABNORMAL LOW (ref 3.87–5.11)
RDW: 14.9 % (ref 11.5–15.5)
WBC: 3.2 10*3/uL — ABNORMAL LOW (ref 4.0–10.5)

## 2018-06-08 LAB — COMPREHENSIVE METABOLIC PANEL
ALK PHOS: 76 U/L (ref 38–126)
ALT: 28 U/L (ref 0–44)
ANION GAP: 9 (ref 5–15)
AST: 34 U/L (ref 15–41)
Albumin: 3.9 g/dL (ref 3.5–5.0)
BUN: 18 mg/dL (ref 8–23)
CALCIUM: 9.5 mg/dL (ref 8.9–10.3)
CO2: 27 mmol/L (ref 22–32)
CREATININE: 0.64 mg/dL (ref 0.44–1.00)
Chloride: 102 mmol/L (ref 98–111)
GFR calc Af Amer: >60 mL/min (ref >60)
Glucose, Bld: 83 mg/dL (ref 70–99)
Potassium: 3.9 mmol/L (ref 3.5–5.1)
SODIUM: 138 mmol/L (ref 135–145)
Total Bilirubin: 0.6 mg/dL (ref 0.3–1.2)
Total Protein: 6.7 g/dL (ref 6.5–8.1)

## 2018-06-08 MED ORDER — TRASTUZUMAB CHEMO 150 MG IV SOLR
2.0000 mg/kg | Freq: Once | INTRAVENOUS | Status: AC
  Administered 2018-06-08: 105 mg via INTRAVENOUS
  Filled 2018-06-08: qty 5

## 2018-06-08 MED ORDER — ACETAMINOPHEN 325 MG PO TABS
ORAL_TABLET | ORAL | Status: AC
  Filled 2018-06-08: qty 2

## 2018-06-08 MED ORDER — HEPARIN SOD (PORK) LOCK FLUSH 100 UNIT/ML IV SOLN
500.0000 [IU] | Freq: Once | INTRAVENOUS | Status: AC | PRN
  Administered 2018-06-08: 500 [IU]
  Filled 2018-06-08: qty 5

## 2018-06-08 MED ORDER — PROCHLORPERAZINE MALEATE 10 MG PO TABS
10.0000 mg | ORAL_TABLET | Freq: Once | ORAL | Status: AC
  Administered 2018-06-08: 10 mg via ORAL

## 2018-06-08 MED ORDER — PACLITAXEL PROTEIN-BOUND CHEMO INJECTION 100 MG
100.0000 mg/m2 | Freq: Once | INTRAVENOUS | Status: AC
  Administered 2018-06-08: 150 mg via INTRAVENOUS
  Filled 2018-06-08: qty 30

## 2018-06-08 MED ORDER — DIPHENHYDRAMINE HCL 25 MG PO CAPS
ORAL_CAPSULE | ORAL | Status: AC
  Filled 2018-06-08: qty 1

## 2018-06-08 MED ORDER — SODIUM CHLORIDE 0.9 % IV SOLN
Freq: Once | INTRAVENOUS | Status: AC
  Administered 2018-06-08: 11:00:00 via INTRAVENOUS
  Filled 2018-06-08: qty 250

## 2018-06-08 MED ORDER — ACETAMINOPHEN 325 MG PO TABS
650.0000 mg | ORAL_TABLET | Freq: Once | ORAL | Status: AC
  Administered 2018-06-08: 650 mg via ORAL

## 2018-06-08 MED ORDER — SODIUM CHLORIDE 0.9% FLUSH
10.0000 mL | INTRAVENOUS | Status: DC | PRN
  Administered 2018-06-08: 10 mL
  Filled 2018-06-08: qty 10

## 2018-06-08 MED ORDER — PROCHLORPERAZINE MALEATE 10 MG PO TABS
ORAL_TABLET | ORAL | Status: AC
  Filled 2018-06-08: qty 1

## 2018-06-08 MED ORDER — DIPHENHYDRAMINE HCL 25 MG PO CAPS
25.0000 mg | ORAL_CAPSULE | Freq: Once | ORAL | Status: AC
  Administered 2018-06-08: 25 mg via ORAL

## 2018-06-08 NOTE — Patient Instructions (Signed)
[facility] Discharge Instructions for Patients Receiving Chemotherapy  Today you received the following chemotherapy agents trastuzumab (Herceptin) & paclitaxel protein-bound (Abraxane).  To help prevent nausea and vomiting after your treatment, we encourage you to take your nausea medication as prescribed.   If you develop nausea and vomiting that is not controlled by your nausea medication, call the clinic.   BELOW ARE SYMPTOMS THAT SHOULD BE REPORTED IMMEDIATELY:  *FEVER GREATER THAN 100.5 F  *CHILLS WITH OR WITHOUT FEVER  NAUSEA AND VOMITING THAT IS NOT CONTROLLED WITH YOUR NAUSEA MEDICATION  *UNUSUAL SHORTNESS OF BREATH  *UNUSUAL BRUISING OR BLEEDING  TENDERNESS IN MOUTH AND THROAT WITH OR WITHOUT PRESENCE OF ULCERS  *URINARY PROBLEMS  *BOWEL PROBLEMS  UNUSUAL RASH Items with * indicate a potential emergency and should be followed up as soon as possible.  Feel free to call the clinic should you have any questions or concerns. The clinic phone number is [phone].  Please show the [place] at check-in to the Emergency Department and triage nurse.

## 2018-06-11 ENCOUNTER — Ambulatory Visit: Payer: Medicare Other | Attending: General Surgery

## 2018-06-11 DIAGNOSIS — Z483 Aftercare following surgery for neoplasm: Secondary | ICD-10-CM | POA: Diagnosis not present

## 2018-06-11 DIAGNOSIS — M25611 Stiffness of right shoulder, not elsewhere classified: Secondary | ICD-10-CM

## 2018-06-11 DIAGNOSIS — R222 Localized swelling, mass and lump, trunk: Secondary | ICD-10-CM | POA: Diagnosis not present

## 2018-06-11 DIAGNOSIS — M25612 Stiffness of left shoulder, not elsewhere classified: Secondary | ICD-10-CM | POA: Diagnosis not present

## 2018-06-11 NOTE — Therapy (Signed)
[place], [place], [address] Phone: [phone]   Fax:  [phone]  Physical Therapy Treatment  Patient Details  Name: Anna Valentine MRN: [id number] Date of Birth: [birth year] Referring Provider (PT): Dr. Donne Hazel    Encounter Date: 06/11/2018  PT End of Session - 06/11/18 0937    Visit Number  13    Number of Visits  13    Date for PT Re-Evaluation  06/13/18    PT Start Time  0852    PT Stop Time  0934    PT Time Calculation (min)  42 min    Activity Tolerance  Patient tolerated treatment well    Behavior During Therapy  [facility] for tasks assessed/performed       Past Medical History:  Diagnosis Date  . Anemia   . BRCA negative 2011   BRCA I/ II negative  . Breast cancer ([place]) 08/2009   stage 2, rx with lumpectomy and xrt  . COPD (chronic obstructive pulmonary disease) ([facility])    pt.unsure of diagnosis status  . Emphysema of lung ([place])    pt. questions diagnosis  . Family history of adverse reaction to anesthesia    My Sister has nausea  . Family history of breast cancer   . Family history of colon cancer   . Family history of lung cancer   . GERD (gastroesophageal reflux disease)    not presently having symptoms  . Lung cancer ([place]) 06/06/05   stage 1 poorly differentiated adenocarcinoma, s/p right lower lobectomy.  . Osteoporosis   . Personal history of lung cancer   . STD (sexually transmitted disease)    HSV    Past Surgical History:  Procedure Laterality Date  . BREAST BIOPSY    . BREAST LUMPECTOMY  10/12/2009   Left lumpectomy and radiation, stage II, ER/PR+, Her 2 nu negative  . BUNIONECTOMY    . COLONOSCOPY    . LOBECTOMY  06/06/2005   RLL  . MASTECTOMY W/ SENTINEL NODE BIOPSY Bilateral 03/15/2018  . MASTECTOMY W/ SENTINEL NODE BIOPSY Bilateral 03/15/2018   Procedure: BILATERAL TOTAL MASTECTOMIES WITH RIGHT SENTINEL LYMPH NODE BIOPSY;  Surgeon: Rolm Bookbinder, MD;   Location: [facility];  Service: General;  Laterality: Bilateral;  . PORTACATH PLACEMENT N/A 03/15/2018   Procedure: INSERTION PORT-A-CATH WITH [place];  Surgeon: Rolm Bookbinder, MD;  Location: [facility];  Service: General;  Laterality: N/A;  . TONSILLECTOMY    . TUBAL LIGATION  1984    There were no vitals filed for this visit.  Subjective Assessment - 06/11/18 0855    Subjective  The tightness is about the same. It hasn't gotten worse, even though my last chemo left me really tired so I couldn't do much.     Pertinent History   New right breast cancer diagnosed on 01/25/2018.  She underwent a bilateral mastectomy wit 6 lymph nodes removed on the right on 03/15/2018. 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  She also had a port placed on the right side at that time.   Pt had previous left breast cancer 2008 with sentinel node removal and radiation, lung cancer in 2006 She has history of frozen shoulder on the right that has resolved and the start of frozen shoulder on the left that resolved with radiation.  She has LFS .    Patient Stated Goals  to keep a good posture and get rid of the pain  Currently in Pain?  No/denies         [facility] PT Assessment - 06/11/18 0001      AROM   Right Shoulder Flexion  171 Degrees    Right Shoulder ABduction  175 Degrees    Left Shoulder Flexion  157 Degrees    Left Shoulder ABduction  177 Degrees                   [facility] Adult PT Treatment/Exercise - 06/11/18 0001      Manual Therapy   Myofascial Release  To axilla, chest and incision in supine to tolerance bilaterally, Rt mastectomy incision appears to have healed well since last treatment, no opening noted today    Passive ROM  In Supine to bil shoulders into flexion, abduction and D2 to pts tolerance, laying on half towel roll throughout session for increased pectoralis stretch                  PT Long Term Goals - 05/16/18 1123      PT LONG  TERM GOAL #1   Title  Pt will report pain is reduced by 50%    Baseline  Pt reports pain mcu improved but did not rate today-04/24/18; 95% improvement-05/03/18    Status  Achieved      PT LONG TERM GOAL #2   Title  Pt will have 150 degrees of bilateral shoulder abduction so that she can return to all household activities at home     Baseline  Rt 155 and Lt 149 degrees-04/24/18; Rt 164 and Lt 160 degrees-05/03/18; Rt 171 and Lt 169- 05/16/18    Status  Achieved      PT LONG TERM GOAL #3   Title  Pt will reduce quick DASH score to < 10 indicating a functional improvment of UEs    Baseline  27.27 on 04/04/2018; 13.64- 05/16/18    Status  Partially Met      PT LONG TERM GOAL #4   Title  Pt will verbalize understanding of lymphedema risk reduction practices     Baseline  Pt attended ABC class and has no further questions regarding lymphedmea risk reduction-04/24/18    Status  Achieved      PT LONG TERM GOAL #5   Title  Pt to report perception of bil axillary tightness improved by 80% to allow increased ease with reaching for ADLs    Baseline  40% -05/16/18    Time  4    Period  Weeks    Status  New            Plan - 06/11/18 3536    Clinical Impression Statement  Pts A/ROM has improved since last measured. Continued with focus towards end ROM and decreasing tightness at chest. Pt reports this always feeling improved after session. She will be ready for D/C at next session.     Rehab Potential  Excellent    Clinical Impairments Affecting Rehab Potential  recent surgery    PT Frequency  1x / week    PT Duration  4 weeks    PT Treatment/Interventions  ADLs/Self Care Home Management;Manual techniques;Orthotic Fit/Training;Patient/family education;Manual lymph drainage;Compression bandaging;Scar mobilization;Passive range of motion    PT Next Visit Plan  D/C next visit. Remeasure ROM and assess goals.     Consulted and Agree with Plan of Care  Patient       Patient will benefit from  skilled therapeutic intervention in order to improve the following  deficits and impairments:  Pain, Impaired sensation, Increased fascial restricitons, Decreased scar mobility, Postural dysfunction, Decreased range of motion, Decreased strength, Impaired UE functional use, Increased edema  Visit Diagnosis: Stiffness of right shoulder, not elsewhere classified  Stiffness of left shoulder, not elsewhere classified  Aftercare following surgery for neoplasm  Localized swelling, mass and lump, trunk     Problem List Patient Active Problem List   Diagnosis Date Noted  . Port-A-Cath in place 04/26/2018  . Breast cancer, right ([place]) 03/15/2018  . Genetic testing 02/20/2018  . Family history of breast cancer   . Family history of lung cancer   . Personal history of lung cancer   . Chest pain 11/28/2017  . Abnormal CT of the chest 11/30/2016  . Malignant neoplasm of upper-inner quadrant of left breast in female, estrogen receptor positive ([place]) 06/16/2014  . Osteopenia 09/13/2013  . Postmenopausal atrophic vaginitis 09/13/2013  . Unspecified vitamin D deficiency 09/13/2013  . Malignant neoplasm of lower lobe of right lung ([place]) 07/24/2012  . History of lung cancer 03/11/2011  . Anxiety 03/11/2011  . History of neutropenia 03/11/2011  . Family history of colon cancer 03/11/2011  . Cough 11/15/2010  . Bronchiectasis without acute exacerbation ([place]) 11/15/2010    Otelia Limes, PTA 06/11/2018, 9:42 AM  [place], [place], [address] Phone: [phone]   Fax:  [phone]  Name: DAROTHY COURTRIGHT MRN: [id number] Date of Birth: Jun 22, 1951

## 2018-06-13 ENCOUNTER — Telehealth: Payer: Self-pay | Admitting: *Deleted

## 2018-06-13 ENCOUNTER — Other Ambulatory Visit: Payer: Self-pay | Admitting: Oncology

## 2018-06-13 ENCOUNTER — Inpatient Hospital Stay: Payer: Medicare Other | Attending: Oncology | Admitting: Oncology

## 2018-06-13 VITALS — BP 149/86 | HR 74 | Temp 97.5°F | Resp 18 | Ht 66.0 in | Wt 118.3 lb

## 2018-06-13 DIAGNOSIS — Z170 Estrogen receptor positive status [ER+]: Secondary | ICD-10-CM | POA: Diagnosis not present

## 2018-06-13 DIAGNOSIS — Z5111 Encounter for antineoplastic chemotherapy: Secondary | ICD-10-CM | POA: Insufficient documentation

## 2018-06-13 DIAGNOSIS — R21 Rash and other nonspecific skin eruption: Secondary | ICD-10-CM

## 2018-06-13 DIAGNOSIS — Z87891 Personal history of nicotine dependence: Secondary | ICD-10-CM

## 2018-06-13 DIAGNOSIS — Z79899 Other long term (current) drug therapy: Secondary | ICD-10-CM | POA: Diagnosis not present

## 2018-06-13 DIAGNOSIS — C3431 Malignant neoplasm of lower lobe, right bronchus or lung: Secondary | ICD-10-CM

## 2018-06-13 DIAGNOSIS — C50212 Malignant neoplasm of upper-inner quadrant of left female breast: Secondary | ICD-10-CM | POA: Insufficient documentation

## 2018-06-13 DIAGNOSIS — R110 Nausea: Secondary | ICD-10-CM | POA: Insufficient documentation

## 2018-06-13 DIAGNOSIS — Z923 Personal history of irradiation: Secondary | ICD-10-CM | POA: Insufficient documentation

## 2018-06-13 DIAGNOSIS — Z85118 Personal history of other malignant neoplasm of bronchus and lung: Secondary | ICD-10-CM | POA: Insufficient documentation

## 2018-06-13 DIAGNOSIS — M810 Age-related osteoporosis without current pathological fracture: Secondary | ICD-10-CM | POA: Insufficient documentation

## 2018-06-13 MED FILL — TRIAMCINOLONE 0.025% CREAM: 0.025 | 10 days supply | Qty: 80 | Fill #1

## 2018-06-13 NOTE — Progress Notes (Signed)
[place]  Telephone:(336) [phone] Fax:(336) [phone]     ID: Anna Valentine DOB: 11-10-1950  MR#: [id number]  JYN#:[id number]  Patient Care Team: [name], MD as PCP - General (Internal Medicine) Lamondre Wesche, Virgie Dad, MD as Consulting Physician (Oncology) Collene Gobble, MD as Consulting Physician (Pulmonary Disease) [name], MD as Referring Physician Armbruster, Carlota Raspberry, MD as Consulting Physician (Gastroenterology) Kem Boroughs, [place] (Family Medicine) [name], MD as Consulting Physician (Radiation Oncology) Rolm Bookbinder, MD as Consulting Physician (General Surgery)  CHIEF COMPLAINT:  (1) Estrogen receptor positive left-sided breast cancer    (2) history of stage IA right lung adenocarcinoma resected November 2006    (3) triple- positive right-sided breast cancer   CURRENT TREATMENT: Adjuvant chemo/immunotherapy  INTERVAL HISTORY: Anna Valentine returns today for follow-up of her triple-positive breast cancer.  She has developed a rash which she initially was in the lower legs but now is also on the arms and even in the abdomen.  She has been treated for this with Zovirax and more recently with triamcinolone but it is not getting any better.  It is very itchy.  She wanted to come in today to make sure we evaluated it before proceeding with further treatment.   REVIEW OF SYSTEMS: Aside from the rash she is tolerating her treatment remarkably well, without any unusual fever, bleeding, fatigue, weight loss, or other major complications.  LUNG CANCER HISTORY:  From Dr. [name] original intake note 07/26/2005: (Lung cancer presentation)  "The patient is a very pleasant 67 year old female without a significant past medical history, who states that in 04/2005 she felt a lump in the left neck.  It did not resolve spontaneously, so she was seen by Dr. [name] [facility], and a CT scan of the neck was performed on 04/25/05.  There was noted to be  two lymph nodes on the left, and they were normal in size, but they were asymmetrical.  No other pathologic findings were noted, except for left internal jugular vein, which was atypically positioned.  However, because of these enlarged lymph nodes, a CT scan of the chest was also obtained by Dr. Renold Genta on 04/27/05, and the CT scan did reveal a poorly defined nodular opacity medially in the right upper lobe, measuring 6.5 mg anterior to posterior.  Within the right lower lobe there was noted to be a lobular nodular mass measuring 13 x 13 mm, and was felt to be worrisome for a primary lung carcinoma.  No other nodules or effusions were seen on the left lung.  On the mediastinal window images there was slight prominence of right hilar nodes, but no other evidence of mediastinal or hilar adenopathy.  A PET scan was thereafter obtained on 05/06/05.  The PET scan did reveal increased FDG activity within a small mass in the posterior superior right lower lobe, highly suspicious for a malignancy.  No significant nodal FDG uptake was identified within the hilar regions or the mediastinum.  Also noted was abnormal FDG uptake in the right middle lobe, corresponding to the CT scan findings, and again suspicious for a malignancy.  There was no evidence of metastatic disease within the neck, abdomen or pelvis.  The patient was then referred to [facility], and was seen by Dr. [name].  The patient underwent a right lower lobe wedge resection with completion lobectomy and mediastinal lymph node biopsies.  The pathology revealed the following:  Right upper lobe wedge resection revealed pulmonary tissue with necrotizing granulomatous inflammation.  No evidence of malignancy.  Right middle lobe wedge revealed again pulmonary tissue with necrotizing granulomatous inflammation.  No evidence of malignancy.  The right pleural lobe (lobectomy) revealed an adenocarcinoma, poorly differentiated, grade 3, measuring 1.2 cm.  The visceral pleura was  negative.  Chest wall negative.  Mediastinum negative.  All margins were negative, including the pleural and parenchymal margins.  There was no evidence of lymphovascular invasion.  In all lymph nodes sampled, including level 11, level 7, level 12, 4R and 2R lymph nodes negative for malignancy.  The patient was staged as T1 N0 M0, stage IA adenocarcinoma of the right lung.  Postoperatively the patient's course was complicated by the development of pleural effusion, requiring diuresis.  She had multiple x-rays performed.  Last x-ray performed on 07/07/05 revealed persistent right pleural effusion.  It was recommended that the patient have followups at [facility].  Dr. [name] Baxley kindly refers the patient to me today for medical oncology evaluation.  Clinically, the patient states that she has recovered well.  She is once again walking.  She was a runner, and she is trying to get back in shape."  LEFT BREAST CANCER HISTORY:From [name]'s 09/30/2009 note:  "She tells me that most recently she had her yearly screening mammogram performed that revealed an abnormality.  She also on exam was noted to have a palpable lump in the upper left breast as well.  Because of the abnormality, patient on September 02, 2009 had a digital diagnostic mammogram of the left breast and ultrasound of the left breast.  The mammogram revealed a 7.0 mm area slightly increased density in the upper left breast.  There were no suspicious calcifications noted.  The breast was heterogeneously dense.  Patient then went onto have an ultrasound of the breast performed and again a 7.0 mm irregular hypoechoic shadowing mass was noted in the 12 o'clock position of the left breast 5.0 cm from the nipple.  There were no enlarged or abnormal left axillary lymph nodes identified.  Patient went onto have a core biopsy performed on 09/02/2009 [phone]).  The needle core biopsy of the 12 o'clock mass revealed an invasive mammary  carcinoma, low to intermediate grade.  It was lobular carcinoma.  Confirmatory immunohistochemical stains revealed the tumor to be strongly positive for cytokeratin AE1-AE3 with an infiltrative pattern of growth and the tumor was negative for E-cadherin stain confirming a lobular phenotype.  The tumor was estrogen receptor positive at 98%, progesterone receptor positive 6%, proliferation marker Ki-67 by MIB was 14%.  The tumor did not express HER-2/neu by CISH with a signal of 1.04.  Patient was seen by Dr. Neldon Mc on 09/08/2009 for discussion of surgical options.  His recommendation was a lumpectomy.  Patient also had bilateral MRI of the breasts performed, which revealed a 0.8 cm mildly enlarged area of mass-like enhancement at the 12 o'clock location corresponding to the biopsy-proven breast cancer.  There was no evidence of lymphadenopathy."   PAST MEDICAL HISTORY: Past Medical History:  Diagnosis Date  . Anemia   . BRCA negative 2011   BRCA I/ II negative  . Breast cancer ([place]) 08/2009   stage 2, rx with lumpectomy and xrt  . COPD (chronic obstructive pulmonary disease) (H[facility])    pt.unsure of diagnosis status  . Emphysema of lung ([place])    pt. questions diagnosis  . Family history of adverse reaction to anesthesia    My Sister has nausea  . Family history of  breast cancer   . Family history of colon cancer   . Family history of lung cancer   . GERD (gastroesophageal reflux disease)    not presently having symptoms  . Lung cancer ([place]) 06/06/05   stage 1 poorly differentiated adenocarcinoma, s/p right lower lobectomy.  . Osteoporosis   . Personal history of lung cancer   . STD (sexually transmitted disease)    HSV    PAST SURGICAL HISTORY: Past Surgical History:  Procedure Laterality Date  . BREAST BIOPSY    . BREAST LUMPECTOMY  10/12/2009   Left lumpectomy and radiation, stage II, ER/PR+, Her 2 nu negative  . BUNIONECTOMY    . COLONOSCOPY    . LOBECTOMY  06/06/2005    RLL  . MASTECTOMY W/ SENTINEL NODE BIOPSY Bilateral 03/15/2018  . MASTECTOMY W/ SENTINEL NODE BIOPSY Bilateral 03/15/2018   Procedure: BILATERAL TOTAL MASTECTOMIES WITH RIGHT SENTINEL LYMPH NODE BIOPSY;  Surgeon: Rolm Bookbinder, MD;  Location: [place];  Service: General;  Laterality: Bilateral;  . PORTACATH PLACEMENT N/A 03/15/2018   Procedure: INSERTION PORT-A-CATH WITH [place];  Surgeon: Rolm Bookbinder, MD;  Location: [place];  Service: General;  Laterality: N/A;  . TONSILLECTOMY    . TUBAL LIGATION  1984    FAMILY HISTORY Family History  Problem Relation Age of Onset  . Allergies Mother   . Asthma Mother   . Lung cancer Mother   . Breast cancer Mother 38       recurrence age 83  . Colon cancer Father 59  . Prostate cancer Brother 66  . Breast cancer Sister 38       Recurrence age 66 BRCA negative  . Colon polyps Sister   . Breast cancer Sister 29  . Prostate cancer Brother 65  . Breast cancer Maternal Grandmother 5  . Colon cancer Maternal Aunt   . Leukemia Maternal Grandfather   . Lung cancer Maternal Aunt   . Breast cancer Cousin   . Esophageal cancer Neg Hx   . Rectal cancer Neg Hx   . Stomach cancer Neg Hx   The patient's maternal grandmother was diagnosed with cancer at age 16. Patient's mother was diagnosed at age 59. The patient's sister was diagnosed at age 79 with recurrence at 27 and is BRCA negative. A second sister was diagnosed at age 48.  GYNECOLOGIC HISTORY:  No LMP recorded. Patient is postmenopausal. Menarche age 49, the patient is GX P0. She went through the change of life in approximately age 59. She did not take hormone replacement.  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 It's just the 2 of them at home. She takes care of 14-year-old for a neighbor and is the primary caregiver for her husband who has had a couple of small strokes and has myelodysplasia. She helps a friend out with her  business. She walks her 74 year old lab. 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 Smoking status: Former Smoker    Packs/day: 2.00    Years: 18.00    Pack years: 36.00    Last attempt to quit: 07/12/1987    Years since quitting: 30.9  . Smokeless tobacco: Never Used  Substance Use Topics  . Alcohol use: Yes    Alcohol/week: 4.0 standard drinks    Types: 4 Standard drinks or equivalent  per week    Comment: social  . Drug use: No    Allergies  Allergen Reactions  . Clarithromycin Rash    Current Outpatient Medications  Medication Sig Dispense Refill  . acyclovir (ZOVIRAX) 400 MG tablet TAKE 1 TABLET BY MOUTH TWICE A DAY 90 tablet 3  . calcium gluconate 500 MG tablet Take 500 mg by mouth daily.    . cholecalciferol (VITAMIN D) 1000 UNITS tablet Take 1,000 Units by mouth daily.    . Psyllium (METAMUCIL PO) Take 5 mLs by mouth daily.     Marland Kitchen triamcinolone (KENALOG) 0.025 % cream Apply 1 application topically 4 (four) times daily. 80 g 2  . vitamin C (ASCORBIC ACID) 500 MG tablet Take 500 mg by mouth daily.     No current facility-administered medications for this visit.     OBJECTIVE: Middle-aged white woman who appears stated age  37:   06/13/18 1233  BP: (!) 149/86  Pulse: 74  Resp: 18  Temp: (!) 97.5 F (36.4 C)  SpO2: 99%     Body mass index is 19.09 kg/m.    ECOG FS:1 - Symptomatic but completely ambulatory   The rash is scattered, over areas as large as 1-1/2 x 1/2 cm, slightly raised, and erythematous.LAB RESULTS:  CMP     Component Value Date/Time   NA 138 06/08/2018 0918   NA 139 12/22/2016 1141   K 3.9 06/08/2018 0918   K 4.2 12/22/2016 1141   CL 102 06/08/2018 0918   CL 103 11/22/2012 1025   CO2 27 06/08/2018 0918   CO2 28 12/22/2016 1141   GLUCOSE 83 06/08/2018 0918   GLUCOSE 92 12/22/2016 1141   GLUCOSE 94  11/22/2012 1025   BUN 18 06/08/2018 0918   BUN 13.2 12/22/2016 1141   CREATININE 0.64 06/08/2018 0918   CREATININE 0.64 11/07/2017 0906   CREATININE 0.7 12/22/2016 1141   CALCIUM 9.5 06/08/2018 0918   CALCIUM 9.7 12/22/2016 1141   PROT 6.7 06/08/2018 0918   PROT 7.0 12/22/2016 1141   ALBUMIN 3.9 06/08/2018 0918   ALBUMIN 3.8 12/22/2016 1141   AST 34 06/08/2018 0918   AST 18 12/22/2016 1141   ALT 28 06/08/2018 0918   ALT 10 12/22/2016 1141   ALKPHOS 76 06/08/2018 0918   ALKPHOS 81 12/22/2016 1141   BILITOT 0.6 06/08/2018 0918   BILITOT 0.65 12/22/2016 1141   GFRNONAA >60 06/08/2018 0918   GFRNONAA 93 11/07/2017 0906   GFRAA >60 06/08/2018 0918   GFRAA 108 11/07/2017 0906    INo results found for: SPEP, UPEP  Lab Results  Component Value Date   WBC 3.2 (L) 06/08/2018   NEUTROABS 1.8 06/08/2018   HGB 10.3 (L) 06/08/2018   HCT 30.9 (L) 06/08/2018   MCV 93.9 06/08/2018   PLT 275 06/08/2018      Chemistry      Component Value Date/Time   NA 138 06/08/2018 0918   NA 139 12/22/2016 1141   K 3.9 06/08/2018 0918   K 4.2 12/22/2016 1141   CL 102 06/08/2018 0918   CL 103 11/22/2012 1025   CO2 27 06/08/2018 0918   CO2 28 12/22/2016 1141   BUN 18 06/08/2018 0918   BUN 13.2 12/22/2016 1141   CREATININE 0.64 06/08/2018 0918   CREATININE 0.64 11/07/2017 0906   CREATININE 0.7 12/22/2016 1141      Component Value Date/Time   CALCIUM 9.5 06/08/2018 0918   CALCIUM 9.7 12/22/2016 1141   ALKPHOS 76 06/08/2018 0918  ALKPHOS 81 12/22/2016 1141   AST 34 06/08/2018 0918   AST 18 12/22/2016 1141   ALT 28 06/08/2018 0918   ALT 10 12/22/2016 1141   BILITOT 0.6 06/08/2018 0918   BILITOT 0.65 12/22/2016 1141       Lab Results  Component Value Date   LABCA2 18 09/30/2009    No components found for: [id number]  No results for input(s): INR in the last 168 hours.  Urinalysis    Component Value Date/Time   BILIRUBINUR neg 09/12/2013 1012   PROTEINUR neg 09/12/2013 1012     UROBILINOGEN negative 09/12/2013 1012   NITRITE neg 09/12/2013 1012   LEUKOCYTESUR Negative 09/12/2013 1012    STUDIES: No results found.  ASSESSMENT: 67 y.o. [place], [place] woman status post right upper lobe wedge resection, middle lobe wedge resection, lower lobectomy and mediastinal lymph node dissection 06/06/2005 for a 1.2 cm grade 3 adenocarcinoma, pT1 pN1, stage Ia  (a) followed at [facility] with every other year chest CT, most recently 06/06/2017  LEFT BREAST CANCER: (1) status post left breast upper outer quadrant biopsy 09/02/2009 for an invasive lobular carcinoma (E-cadherin negative) estrogen receptor 98% positive, progesterone receptor 6% positive, with an MIB-1 of 14% and no HER-2 amplification. [[id number] [id number]]  (2) status post left lumpectomy and sentinel lymph node sampling 10/12/2009 for a pT1b pN1, stage IIA invasive lobular breast cancer, with negative margins.  (3) Oncotype DX score of 16 predicts a risk of outside the breast recurrence of 10% if the patient's only systemic treatment is tamoxifen for 5 years. It also predicts no benefit from adjuvant chemotherapy  (4) completed adjuvant radiation therapy 01/28/2010, receiving 5040 cGy to the left breast, with a boost to the upper inner aspect of the breast (to a cumulative dose of 6300 cGy); the axillary and supraclavicular regions received 4500 cGy  (5) started anastrozole July 2011, completedI 5 years July 2016  RIGHT BREAST CANCER: (6) status post right breast upper outer quadrant biopsy 01/29/2018 for a clinical  T1c N0, stage IA invasive ductal carcinoma, grade 2, estrogen receptor positive, progesterone receptor negative, HER-2 amplified, with an MIB-1 of 15%  (7) status post bilateral mastectomy with right sentinel lymph node sampling 03/15/2018, showing  (a) on the left, no malignancy noted  (b) on the right, a pT1c pN0,stage IA invasive ductal carcinoma, grade 2, with negative margins.  (c) a total of 6 lymph nodes  removed from the right axilla, none from the left  (8) chemotherapy consisting of paclitaxel weekly x12 started 04/26/2018, with trastuzumab to be given for 1 year  (a) myocardial perfusion study 12/20/2017 showed an ejection fraction of 75% (hyperdynamic  (b) paclitaxel switched to Abraxane with the dose #2 because of initial reaction to Taxol  (9) adjuvant radiation not indicated  (10) antiestrogens to follow at the completion of local treatment  (11) bone density 02/21/2012 at [facility] showed osteoporosis with a T score of -2.5; on alendronate  (12) repeat genetics testing 02/08/2018 offered through [facility] Common Hereditary Cancers Panel and 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 There were no deleterious mutations in: APC, ATM, AXIN2, BARD1, BMPR1A, BRCA1, BRCA2, BRIP1, CDH1, CDKN2A (p14ARF), CDKN2A (p16INK4a), CKD4, CHEK2, CTNNA1, DICER1, EPCAM (Deletion/duplication testing only), GREM1 (promoter region deletion/duplication testing only), KIT, MEN1, MLH1, MSH2, MSH3, MSH6, MUTYH, NBN, NF1, NHTL1, PALB2, PDGFRA, PMS2, POLD1, POLE, PTEN, RAD50, RAD51C, RAD51D, SDHB, SDHC, SDHD, SMAD4, SMARCA4. STK11, TSC1, TSC2, and VHL.  The following genes were evaluated for sequence changes only:  SDHA and HOXB13 c.251G>A variant only.  (a) see "cancer surveillance" below for details of long term Maylon Peppers related screening studies  PLAN: I do think Anna Valentine either had an unusual exposure (she visited a pig farm before the rash appeared), has changed her diet and is now allergic to some food element there, or indeed is having a drug rash.  This does not look like a fungal or viral rash to me.  We are going to give her a week off treatment.  Hopefully that will help.  She will then see me again on 06/21/2018 and on that day she will only receive the Abraxane.  We are going to leave the trastuzumab off until we have a better idea of what is going on with this skin issue.  Once we do  start the trastuzumab again she will receive it every 21 days and not weekly.  She will refill the triamcinolone and continue to use it as long as it seems to be helping  She will call if the rash appears to be any worse or she develops a fever, purulence, or any other new symptoms.    Alwin Lanigan, Virgie Dad, MD  06/13/18 12:48 PM Medical Oncology and Hematology [facility] [address] [place], [place] [address] Tel. [phone]    Fax. [phone]   I, Jacqualyn Posey am acting as a Education administrator for Chauncey Cruel, MD.   I, Lurline Del MD, have reviewed the above documentation for accuracy and completeness, and I agree with the above.     ADDENDUM: TP53 mutations Li-Fraumeni syndrome increases the risk for soft tissue sarcomas, osteosarcomas, pre-menopausal breast cancer in women, brain tumors, adrenocortical carcinoma, and leukemias. These cancers can occur in childhood, and throughout adulthood, with approximately 50% of individuals being diagnosed with cancer by age 4 and 90% by age 45.We discussed that Anna Valentine's family is not a classic Li-Fraumeni family, however this result is still important to share with family members as we cannot predict how it will affect other individuals and it is still important for Anna Valentine to have additional screening.   THINGS TO AVOID: Because of the increased risk for cancer, avoiding risks that can increase that risk is recommended. Therefore, limiting sun exposure and using sunscreen, and eliminating tobacco use and exposure to other known or suspected carcinogens is encouraged. There is evidence of increased sensitivity to ionizing radiation. Therefore individuals should avoid or minimize exposure to diagnostic and therapeutic radiation whenever possible, to reduce the risk of secondary radiation-induced malignancies is encouraged.  CANCER SURVEILLANCE:The following is recommended for following individuals with LFS  includes:  1. Children and adults should undergo comprehensive annual physical examination including careful skin and neurological exams with the focus on risks for rare early onset cancer and second malignancies in cancer survivors. 2. To reduce the risk for breast cancer, prophylactic bilateral mastectomy is the most effective option.  3. To reduce the risk for colon cancer, adults should consider colonoscopy every 2-5 years beginning no later than age 21 years.  4. Whole body MRI with brain MRI annually.  It may be helpful to participate in a Li-Fraumeni Syndrome clinic so that updates on screening and surveillance for the condition may be conveyed.

## 2018-06-13 NOTE — Telephone Encounter (Signed)
Called pt to come in today to see Dr Jana Hakim.  She is able to come @ 12:30 pm & message sent to scheduling to schedule appt.

## 2018-06-13 NOTE — Telephone Encounter (Signed)
Received vm call from pt stating that she had skin problems after chemo treatment & talked with Sandi Mealy & he ordered prednisone & steroid cream but skin problems cont.  She has a spot on her leg since last week chemo & place on her stomach.  She is coming tomorrow & would like someone to look at this.  She states Lucianne Lei wondered if it was a reaction to the herceptin & if so, she is concerned about this since she will be getting this drug for a year. Called pt & assured her that this message would be passed along & that someone can come by to look at then.

## 2018-06-14 ENCOUNTER — Other Ambulatory Visit: Payer: Medicare Other

## 2018-06-14 ENCOUNTER — Ambulatory Visit: Payer: Medicare Other

## 2018-06-18 ENCOUNTER — Ambulatory Visit: Payer: Medicare Other

## 2018-06-18 DIAGNOSIS — M25611 Stiffness of right shoulder, not elsewhere classified: Secondary | ICD-10-CM

## 2018-06-18 DIAGNOSIS — Z483 Aftercare following surgery for neoplasm: Secondary | ICD-10-CM | POA: Diagnosis not present

## 2018-06-18 DIAGNOSIS — R222 Localized swelling, mass and lump, trunk: Secondary | ICD-10-CM | POA: Diagnosis not present

## 2018-06-18 DIAGNOSIS — M25612 Stiffness of left shoulder, not elsewhere classified: Secondary | ICD-10-CM | POA: Diagnosis not present

## 2018-06-18 NOTE — Therapy (Addendum)
[place], [place], [address] Phone: [phone]   Fax:  [phone]  Physical Therapy Treatment  Patient Details  Name: Anna Valentine MRN: [id number] Date of Birth: January 21, 1951 Referring Provider (PT): Dr. Donne Hazel    Encounter Date: 06/18/2018  PT End of Session - 06/18/18 1018    Visit Number  14    Number of Visits  13    Date for PT Re-Evaluation  06/13/18    PT Start Time  0937    PT Stop Time  1018    PT Time Calculation (min)  41 min    Activity Tolerance  Patient tolerated treatment well    Behavior During Therapy  [facility] for tasks assessed/performed       Past Medical History:  Diagnosis Date  . Anemia   . BRCA negative 2011   BRCA I/ II negative  . Breast cancer ([facility]) 08/2009   stage 2, rx with lumpectomy and xrt  . COPD (chronic obstructive pulmonary disease) ([facility])    pt.unsure of diagnosis status  . Emphysema of lung ([facility])    pt. questions diagnosis  . Family history of adverse reaction to anesthesia    My Sister has nausea  . Family history of breast cancer   . Family history of colon cancer   . Family history of lung cancer   . GERD (gastroesophageal reflux disease)    not presently having symptoms  . Lung cancer ([facility] [place]) 06/06/05   stage 1 poorly differentiated adenocarcinoma, s/p right lower lobectomy.  . Osteoporosis   . Personal history of lung cancer   . STD (sexually transmitted disease)    HSV    Past Surgical History:  Procedure Laterality Date  . BREAST BIOPSY    . BREAST LUMPECTOMY  10/12/2009   Left lumpectomy and radiation, stage II, ER/PR+, Her 2 nu negative  . BUNIONECTOMY    . COLONOSCOPY    . LOBECTOMY  06/06/2005   RLL  . MASTECTOMY W/ SENTINEL NODE BIOPSY Bilateral 03/15/2018  . MASTECTOMY W/ SENTINEL NODE BIOPSY Bilateral 03/15/2018   Procedure: BILATERAL TOTAL MASTECTOMIES WITH RIGHT SENTINEL LYMPH NODE BIOPSY;  Surgeon: Rolm Bookbinder, MD;   Location: [facility];  Service: General;  Laterality: Bilateral;  . PORTACATH PLACEMENT N/A 03/15/2018   Procedure: INSERTION PORT-A-CATH WITH [place];  Surgeon: Rolm Bookbinder, MD;  Location: [facility];  Service: General;  Laterality: N/A;  . TONSILLECTOMY    . TUBAL LIGATION  1984    There were no vitals filed for this visit.  Subjective Assessment - 06/18/18 0939    Subjective  I can tell the tightness is still present but it's nothing like it used to be after surgery, at least 85% better from then.     Pertinent History   New right breast cancer diagnosed on 01/25/2018.  She underwent a bilateral mastectomy wit 6 lymph nodes removed on the right on 03/15/2018. 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  She also had a port placed on the right side at that time.   Pt had previous left breast cancer 2008 with sentinel node removal and radiation, lung cancer in 2006 She has history of frozen shoulder on the right that has resolved and the start of frozen shoulder on the left that resolved with radiation.  She has LFS .    Patient Stated Goals  to keep a good posture and get rid of the pain  Currently in Pain?  No/denies         [facility] PT Assessment - 06/18/18 0001      AROM   Right Shoulder Flexion  171 Degrees    Right Shoulder ABduction  175 Degrees    Left Shoulder Flexion  161 Degrees    Left Shoulder ABduction  177 Degrees           Quick Dash - 06/18/18 0001    Open a tight or new jar  No difficulty    Do heavy household chores (wash walls, wash floors)  No difficulty    Carry a shopping bag or briefcase  No difficulty    Wash your back  No difficulty    Use a knife to cut food  No difficulty    Recreational activities in which you take some force or impact through your arm, shoulder, or hand (golf, hammering, tennis)  No difficulty    During the past week, to what extent has your arm, shoulder or hand problem interfered with your normal social  activities with family, friends, neighbors, or groups?  Not at all    During the past week, to what extent has your arm, shoulder or hand problem limited your work or other regular daily activities  Not at all    Arm, shoulder, or hand pain.  None    Tingling (pins and needles) in your arm, shoulder, or hand  None    Difficulty Sleeping  No difficulty    DASH Score  0 %             [facility] Adult PT Treatment/Exercise - 06/18/18 0001      Manual Therapy   Myofascial Release  To axilla, chest and incision in supine to tolerance bilaterally, Rt mastectomy incision appears to have healed well since last treatment, no opening noted today    Passive ROM  In Supine to bil shoulders into flexion, abduction and D2 to pts tolerance, laying on half towel roll throughout session for increased pectoralis stretch                  PT Long Term Goals - 06/18/18 0941      PT LONG TERM GOAL #1   Title  Pt will report pain is reduced by 50%    Baseline  Pt reports pain mcu improved but did not rate today-04/24/18; 95% improvement-05/03/18; just tightness at this point-06/18/18    Status  Achieved      PT LONG TERM GOAL #2   Title  Pt will have 150 degrees of bilateral shoulder abduction so that she can return to all household activities at home     Baseline  Rt 155 and Lt 149 degrees-04/24/18; Rt 164 and Lt 160 degrees-05/03/18; Rt 171 and Lt 169- 05/16/18; Rt 175 and Lt 177-06/18/18    Status  Achieved      PT LONG TERM GOAL #3   Title  Pt will reduce quick DASH score to < 10 indicating a functional improvment of UEs    Baseline  27.27 on 04/04/2018; 13.64- 05/16/18; 0% limited now-06/18/18    Status  Achieved      PT LONG TERM GOAL #4   Title  Pt will verbalize understanding of lymphedema risk reduction practices     Baseline  Pt attended ABC class and has no further questions regarding lymphedmea risk reduction-04/24/18    Status  Achieved      PT LONG TERM GOAL #5  Title  Pt to  report perception of bil axillary tightness improved by 80% to allow increased ease with reaching for ADLs    Baseline  40% -05/16/18; 85% improvement with this at this time-06/18/18    Status  Achieved            Plan - 06/18/18 1020    Clinical Impression Statement  Pt has done excellent this episode of therapy and is ready for D/C at this time having met all goals.     Rehab Potential  Excellent    Clinical Impairments Affecting Rehab Potential  recent surgery    PT Frequency  1x / week    PT Duration  4 weeks    PT Treatment/Interventions  ADLs/Self Care Home Management;Manual techniques;Orthotic Fit/Training;Patient/family education;Manual lymph drainage;Compression bandaging;Scar mobilization;Passive range of motion    PT Next Visit Plan  D/C this visit.     Consulted and Agree with Plan of Care  Patient       Patient will benefit from skilled therapeutic intervention in order to improve the following deficits and impairments:  Pain, Impaired sensation, Increased fascial restricitons, Decreased scar mobility, Postural dysfunction, Decreased range of motion, Decreased strength, Impaired UE functional use, Increased edema  Visit Diagnosis: Stiffness of right shoulder, not elsewhere classified  Stiffness of left shoulder, not elsewhere classified  Aftercare following surgery for neoplasm  Localized swelling, mass and lump, trunk     Problem List Patient Active Problem List   Diagnosis Date Noted  . Port-A-Cath in place 04/26/2018  . Breast cancer, right ([facility]) 03/15/2018  . Genetic testing 02/20/2018  . Family history of breast cancer   . Family history of lung cancer   . Personal history of lung cancer   . Chest pain 11/28/2017  . Abnormal CT of the chest 11/30/2016  . Malignant neoplasm of upper-inner quadrant of left breast in female, estrogen receptor positive ([facility]) 06/16/2014  . Osteopenia 09/13/2013  . Postmenopausal atrophic vaginitis 09/13/2013  .  Unspecified vitamin D deficiency 09/13/2013  . Malignant neoplasm of lower lobe of right lung ([place] [facility] [place]) 07/24/2012  . History of lung cancer 03/11/2011  . Anxiety 03/11/2011  . History of neutropenia 03/11/2011  . Family history of colon cancer 03/11/2011  . Cough 11/15/2010  . Bronchiectasis without acute exacerbation ([facility]) 11/15/2010    Otelia Limes, PTA 06/18/2018, 12:22 PM  [place], [place], [address] Phone: [phone]   Fax:  [phone]  Name: Anna Valentine MRN: [id number] Date of Birth: 1950/10/10   PHYSICAL THERAPY DISCHARGE SUMMARY  Visits from Start of Care: 14  Current functional level related to goals / functional outcomes: See above   Remaining deficits: See above   Education / Equipment: Final HEP Plan: Patient agrees to discharge.  Patient goals were met. Patient is being discharged due to meeting the stated rehab goals.  ?????    Shan Levans, PT 07/19/18

## 2018-06-19 ENCOUNTER — Ambulatory Visit (HOSPITAL_COMMUNITY)
Admission: RE | Admit: 2018-06-19 | Discharge: 2018-06-19 | Disposition: A | Discharge status: Home / Self Care | Payer: Medicare Other | Source: Ambulatory Visit | Attending: Cardiology | Admitting: Cardiology

## 2018-06-19 ENCOUNTER — Ambulatory Visit (HOSPITAL_BASED_OUTPATIENT_CLINIC_OR_DEPARTMENT_OTHER)
Admission: RE | Admit: 2018-06-19 | Discharge: 2018-06-19 | Disposition: A | Discharge status: Home / Self Care | Payer: Medicare Other | Source: Ambulatory Visit | Attending: Cardiology | Admitting: Cardiology

## 2018-06-19 VITALS — BP 150/82 | HR 76 | Wt 118.2 lb

## 2018-06-19 DIAGNOSIS — I272 Pulmonary hypertension, unspecified: Secondary | ICD-10-CM

## 2018-06-19 DIAGNOSIS — I361 Nonrheumatic tricuspid (valve) insufficiency: Secondary | ICD-10-CM | POA: Insufficient documentation

## 2018-06-19 DIAGNOSIS — R079 Chest pain, unspecified: Secondary | ICD-10-CM | POA: Diagnosis not present

## 2018-06-19 DIAGNOSIS — C50212 Malignant neoplasm of upper-inner quadrant of left female breast: Secondary | ICD-10-CM | POA: Diagnosis not present

## 2018-06-19 DIAGNOSIS — Z170 Estrogen receptor positive status [ER+]: Secondary | ICD-10-CM | POA: Diagnosis not present

## 2018-06-19 NOTE — Progress Notes (Signed)
  Echocardiogram 2D Echocardiogram has been performed.  Anna Valentine 06/19/2018, 3:00 PM

## 2018-06-19 NOTE — Progress Notes (Signed)
Oncology: Dr. Jana Hakim  67 yo with history of COPD, prior lung cancer, and breast cancer was referred by Dr. Jana Hakim for cardio-oncology evaluation given use of Herceptin. Patient had breast cancer diagnosed on left in 2/11, ER+/PR+/HER2-.  She had left lumpectomy and radiation.  Diagnosed in 7/19 with right breast cancer, ER+/PR-/HER2+.  She had bilateral mastectomy.  Plan initially for paclitaxel x 12 cycles and Herceptin x 1 year.  Intolerant of paclitaxel, switched to abraxane.    She had atypical chest pain in 6/19, Cardiolite at that time showed no ischemia.  Other than that incident, no cardiac history.  She is a prior smoker but has quit.  No significant exertional dyspnea.  No chest pain.   PMH: 1. COPD: Prior smoker.  2. Lung cancer: Adenocarcinoma.  In 11/06, had RUL wedge resection, right middle lobe wedge resection, and right lower lobectomy.  3. Breast cancer: Diagnosed on left in 2/11, ER+/PR+/HER2-.  She had left lumpectomy and radiation.  Diagnosed in 7/19 with right breast cancer, ER+/PR-/HER2+.  She had bilateral mastectomy.  Plan initially for paclitaxel x 12 cycles and Herceptin x 1 year.  Intolerant of paclitaxel, switched to abraxane.   - Echo (12/19): EF 55-60%, GLS -19%, normal RV size and systolic function, PASP 48 mmHg 4. Chest pain: Cardiolite (6/19) with EF 75%, no ischemia.   Social History   Socioeconomic History  . Marital status: Married    Spouse name: Not on file  . Number of children: 0  . Years of education: Not on file  . Highest education level: Not on file  Occupational History  . Occupation: OFFICE MANAGER    Employer: [employer]  Social Needs  . Financial resource strain: Not on file  . Food insecurity:    Worry: Not on file    Inability: Not on file  . Transportation needs:    Medical: Not on file    Non-medical: Not on file  Tobacco Use  . Smoking status: Former Smoker    Packs/day: 2.00    Years: 18.00    Pack years: 36.00    Last  attempt to quit: 07/12/1987    Years since quitting: 30.9  . Smokeless tobacco: Never Used  Substance and Sexual Activity  . Alcohol use: Yes    Alcohol/week: 4.0 standard drinks    Types: 4 Standard drinks or equivalent per week    Comment: social  . Drug use: No  . Sexual activity: Yes  Lifestyle  . Physical activity:    Days per week: Not on file    Minutes per session: Not on file  . Stress: Not on file  Relationships  . Social connections:    Talks on phone: Not on file    Gets together: Not on file    Attends religious service: Not on file    Active member of club or organization: Not on file    Attends meetings of clubs or organizations: Not on file    Relationship status: Not on file  . Intimate partner violence:    Fear of current or ex partner: Not on file    Emotionally abused: Not on file    Physically abused: Not on file    Forced sexual activity: Not on file  Other Topics Concern  . Not on file  Social History Narrative  . Not on file   Family History  Problem Relation Age of Onset  . Allergies Mother   . Asthma Mother   . Lung  cancer Mother   . Breast cancer Mother 38       recurrence age 4  . Colon cancer Father 45  . Prostate cancer Brother 8  . Breast cancer Sister 76       Recurrence age 59 BRCA negative  . Colon polyps Sister   . Breast cancer Sister 94  . Prostate cancer Brother 58  . Breast cancer Maternal Grandmother 78  . Colon cancer Maternal Aunt   . Leukemia Maternal Grandfather   . Lung cancer Maternal Aunt   . Breast cancer Cousin   . Esophageal cancer Neg Hx   . Rectal cancer Neg Hx   . Stomach cancer Neg Hx    ROS: All systems reviewed and negative except as per HPI.   Current Outpatient Medications  Medication Sig Dispense Refill  . acyclovir (ZOVIRAX) 400 MG tablet TAKE 1 TABLET BY MOUTH TWICE A DAY 90 tablet 3  . calcium gluconate 500 MG tablet Take 500 mg by mouth daily.    . cholecalciferol (VITAMIN D) 1000 UNITS tablet  Take 1,000 Units by mouth daily.    . Psyllium (METAMUCIL PO) Take 5 mLs by mouth daily.     Marland Kitchen triamcinolone (KENALOG) 0.025 % cream Apply 1 application topically 4 (four) times daily. 80 g 2  . vitamin C (ASCORBIC ACID) 500 MG tablet Take 500 mg by mouth daily.     No current facility-administered medications for this encounter.    BP (!) 150/82   Pulse 76   Wt 53.6 kg (118 lb 3.2 oz)   SpO2 97%   BMI 19.08 kg/m  General: NAD Neck: No JVD, no thyromegaly or thyroid nodule.  Lungs: Decreased BS right base.  CV: Nondisplaced PMI.  Heart regular S1/S2, no S3/S4, no murmur.  No peripheral edema.  No carotid bruit.  Normal pedal pulses.  Abdomen: Soft, nontender, no hepatosplenomegaly, no distention.  Skin: Intact without lesions or rashes.  Neurologic: Alert and oriented x 3.  Psych: Normal affect. Extremities: No clubbing or cyanosis.  HEENT: Normal.   Assessment/Plan: 1. Breast cancer: She is currently undergoing treatment including Herceptin for 1 year.  We discussed the potential cardiac toxicity of Herceptin and the rationale behind echo screening.  I reviewed today's echo, it showed normal EF and strain pattern.  There was mild pulmonary hypertension, possibly related to COPD and prior lung surgery.  - Repeat echo in 3 months with office followup.  2. Pulmonary hypertension: By echo, PASP 48 mmHg.  She has a history of COPD and also of extensive right lung resections for lung cancer.  Suspect group 3 pulmonary hypertension from lung disease.  3. Chest pain: No ischemia on 6/19 Cardiolite.  No recurrence.   Loralie Champagne 06/19/2018

## 2018-06-19 NOTE — Patient Instructions (Signed)
Follow up and echo in 3 months.

## 2018-06-20 NOTE — Progress Notes (Signed)
Anna Valentine  [phone] [phone]    ID: Anna Valentine DOB: 01-31-1951  MR#: [id number]  ALP#:[id number]  Patient Care Team: [name], MD as PCP - General (Internal Medicine) Osha Rane, Virgie Dad, MD as Consulting Physician (Oncology) Collene Gobble, MD as Consulting Physician (Pulmonary Disease) [name], MD as Referring Physician Armbruster, Carlota Raspberry, MD as Consulting Physician (Gastroenterology) Kem Boroughs, [place] (Family Medicine) [name], MD as Consulting Physician (Radiation Oncology) Rolm Bookbinder, MD as Consulting Physician (General Surgery)  CHIEF COMPLAINT:  (1) Estrogen receptor positive left-sided breast cancer    (2) history of stage IA right lung adenocarcinoma resected November 2006    (3) triple- positive right-sided breast cancer   CURRENT TREATMENT: Adjuvant chemo/immunotherapy  INTERVAL HISTORY: Anna Valentine returns today for follow-up of her triple positive breast cancer.   The patient continues on adjuvant chemo/immunotherapy. She is receiving trastuzumab and Abraxane/paclitaxel.  She has received 7 cycles of taxanes so far.  After 6 cycles she developed a rash.  We did not know if this was due to an exposure or one of the medications.  She was given a week off treatment and returns today to receive only the paclitaxel.  If she does not develop the rash we will continue the paclitaxel to a total of 12 cycles as planned and resume the trastuzumab next week. The rash is being treated with a cream, which helps, but the itching is still present.   Since her last visit here, she underwent an echocardiogram on 06/19/2018, showing an ejection fraction in the 55% - 60% range.   REVIEW OF SYSTEMS: Anna Valentine is doing well overall. For exercise, she is continuing to walk her dog. For Thanksgiving, she made a small [place] and kept it rather quite around the house. The patient denies unusual headaches, visual changes,  nausea, vomiting, or dizziness. There has been no unusual cough, phlegm production, or pleurisy. This been no change in bowel or bladder habits. The patient denies unexplained fatigue or unexplained weight loss, bleeding, rash, or fever. A detailed review of systems was otherwise noncontributory.    LUNG CANCER HISTORY:  From Dr. [name] original intake note 07/26/2005: (Lung cancer presentation)  "The patient is a very pleasant 67 year old female without a significant past medical history, who states that in 04/2005 she felt a lump in the left neck.  It did not resolve spontaneously, so she was seen by Dr. [name] [facility], and a CT scan of the neck was performed on 04/25/05.  There was noted to be two lymph nodes on the left, and they were normal in size, but they were asymmetrical.  No other pathologic findings were noted, except for left internal jugular vein, which was atypically positioned.  However, because of these enlarged lymph nodes, a CT scan of the chest was also obtained by Dr. Renold Genta on 04/27/05, and the CT scan did reveal a poorly defined nodular opacity medially in the right upper lobe, measuring 6.5 mg anterior to posterior.  Within the right lower lobe there was noted to be a lobular nodular mass measuring 13 x 13 mm, and was felt to be worrisome for a primary lung carcinoma.  No other nodules or effusions were seen on the left lung.  On the mediastinal window images there was slight prominence of right hilar nodes, but no other evidence of mediastinal or hilar adenopathy.  A PET scan was thereafter obtained on 05/06/05.  The PET scan did reveal increased FDG activity  within a small mass in the posterior superior right lower lobe, highly suspicious for a malignancy.  No significant nodal FDG uptake was identified within the hilar regions or the mediastinum.  Also noted was abnormal FDG uptake in the right middle lobe, corresponding to the CT scan findings, and again suspicious for a  malignancy.  There was no evidence of metastatic disease within the neck, abdomen or pelvis.  The patient was then referred to [facility], and was seen by Dr. [name].  The patient underwent a right lower lobe wedge resection with completion lobectomy and mediastinal lymph node biopsies.  The pathology revealed the following:  Right upper lobe wedge resection revealed pulmonary tissue with necrotizing granulomatous inflammation.  No evidence of malignancy.  Right middle lobe wedge revealed again pulmonary tissue with necrotizing granulomatous inflammation.  No evidence of malignancy.  The right pleural lobe (lobectomy) revealed an adenocarcinoma, poorly differentiated, grade 3, measuring 1.2 cm.  The visceral pleura was negative.  Chest wall negative.  Mediastinum negative.  All margins were negative, including the pleural and parenchymal margins.  There was no evidence of lymphovascular invasion.  In all lymph nodes sampled, including level 11, level 7, level 12, 4R and 2R lymph nodes negative for malignancy.  The patient was staged as T1 N0 M0, stage IA adenocarcinoma of the right lung.  Postoperatively the patient's course was complicated by the development of pleural effusion, requiring diuresis.  She had multiple x-rays performed.  Last x-ray performed on 07/07/05 revealed persistent right pleural effusion.  It was recommended that the patient have followups at [facility].  Dr. [name] Baxley kindly refers the patient to me today for medical oncology evaluation.  Clinically, the patient states that she has recovered well.  She is once again walking.  She was a runner, and she is trying to get back in shape."  LEFT BREAST CANCER HISTORY:From [name]'s 09/30/2009 note:  "She tells me that most recently she had her yearly screening mammogram performed that revealed an abnormality.  She also on exam was noted to have a palpable lump in the upper left breast as well.  Because of the abnormality, patient  on September 02, 2009 had a digital diagnostic mammogram of the left breast and ultrasound of the left breast.  The mammogram revealed a 7.0 mm area slightly increased density in the upper left breast.  There were no suspicious calcifications noted.  The breast was heterogeneously dense.  Patient then went onto have an ultrasound of the breast performed and again a 7.0 mm irregular hypoechoic shadowing mass was noted in the 12 o'clock position of the left breast 5.0 cm from the nipple.  There were no enlarged or abnormal left axillary lymph nodes identified.  Patient went onto have a core biopsy performed on 09/02/2009 [phone]).  The needle core biopsy of the 12 o'clock mass revealed an invasive mammary carcinoma, low to intermediate grade.  It was lobular carcinoma.  Confirmatory immunohistochemical stains revealed the tumor to be strongly positive for cytokeratin AE1-AE3 with an infiltrative pattern of growth and the tumor was negative for E-cadherin stain confirming a lobular phenotype.  The tumor was estrogen receptor positive at 98%, progesterone receptor positive 6%, proliferation marker Ki-67 by MIB was 14%.  The tumor did not express HER-2/neu by CISH with a signal of 1.04.  Patient was seen by Dr. Neldon Mc on 09/08/2009 for discussion of surgical options.  His recommendation was a lumpectomy.  Patient also had bilateral MRI of  the breasts performed, which revealed a 0.8 cm mildly enlarged area of mass-like enhancement at the 12 o'clock location corresponding to the biopsy-proven breast cancer.  There was no evidence of lymphadenopathy."   PAST MEDICAL HISTORY: Past Medical History:  Diagnosis Date  . Anemia   . BRCA negative 2011   BRCA I/ II negative  . Breast cancer ([facility]) 08/2009   stage 2, rx with lumpectomy and xrt  . COPD (chronic obstructive pulmonary disease) ([facility])    pt.unsure of diagnosis status  . Emphysema of lung ([facility])    pt. questions diagnosis  . Family history of  adverse reaction to anesthesia    My Sister has nausea  . Family history of breast cancer   . Family history of colon cancer   . Family history of lung cancer   . GERD (gastroesophageal reflux disease)    not presently having symptoms  . Lung cancer ([facility]) 06/06/05   stage 1 poorly differentiated adenocarcinoma, s/p right lower lobectomy.  . Osteoporosis   . Personal history of lung cancer   . STD (sexually transmitted disease)    HSV    PAST SURGICAL HISTORY: Past Surgical History:  Procedure Laterality Date  . BREAST BIOPSY    . BREAST LUMPECTOMY  10/12/2009   Left lumpectomy and radiation, stage II, ER/PR+, Her 2 nu negative  . BUNIONECTOMY    . COLONOSCOPY    . LOBECTOMY  06/06/2005   RLL  . MASTECTOMY W/ SENTINEL NODE BIOPSY Bilateral 03/15/2018  . MASTECTOMY W/ SENTINEL NODE BIOPSY Bilateral 03/15/2018   Procedure: BILATERAL TOTAL MASTECTOMIES WITH RIGHT SENTINEL LYMPH NODE BIOPSY;  Surgeon: Rolm Bookbinder, MD;  Location: [facility];  Service: General;  Laterality: Bilateral;  . PORTACATH PLACEMENT N/A 03/15/2018   Procedure: INSERTION PORT-A-CATH WITH [place];  Surgeon: Rolm Bookbinder, MD;  Location: [facility];  Service: General;  Laterality: N/A;  . TONSILLECTOMY    . TUBAL LIGATION  1984    FAMILY HISTORY Family History  Problem Relation Age of Onset  . Allergies Mother   . Asthma Mother   . Lung cancer Mother   . Breast cancer Mother 20       recurrence age 34  . Colon cancer Father 6  . Prostate cancer Brother 11  . Breast cancer Sister 2       Recurrence age 6 BRCA negative  . Colon polyps Sister   . Breast cancer Sister 53  . Prostate cancer Brother 26  . Breast cancer Maternal Grandmother 60  . Colon cancer Maternal Aunt   . Leukemia Maternal Grandfather   . Lung cancer Maternal Aunt   . Breast cancer Cousin   . Esophageal cancer Neg Hx   . Rectal cancer Neg Hx   . Stomach cancer Neg Hx   The patient's maternal grandmother was diagnosed with cancer at  age 24. Patient's mother was diagnosed at age 50. The patient's sister was diagnosed at age 59 with recurrence at 17 and is BRCA negative. A second sister was diagnosed at age 51.  GYNECOLOGIC HISTORY:  No LMP recorded. Patient is postmenopausal. Menarche age 43, the patient is GX P0. She went through the change of life in approximately age 35. She did not take hormone replacement.  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 It's just the 2 of them at home. She takes care of 66-year-old for a neighbor and is the primary caregiver for her husband who  has had a couple of small strokes and has myelodysplasia. She helps a friend out with her business. She walks her 52 year old lab. 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 Smoking status: Former Smoker    Packs/day: 2.00    Years: 18.00    Pack years: 36.00    Last attempt to quit: 07/12/1987    Years since quitting: 30.9  . Smokeless tobacco: Never Used  Substance Use Topics  . Alcohol use: Yes    Alcohol/week: 4.0 standard drinks    Types: 4 Standard drinks or equivalent per week    Comment: social  . Drug use: No    Allergies  Allergen Reactions  . Clarithromycin Rash    Current Outpatient Medications  Medication Sig Dispense Refill  . acyclovir (ZOVIRAX) 400 MG tablet TAKE 1 TABLET BY MOUTH TWICE A DAY 90 tablet 3  . calcium gluconate 500 MG tablet Take 500 mg by mouth daily.    . cholecalciferol (VITAMIN D) 1000 UNITS tablet Take 1,000 Units by mouth daily.    . fluconazole (DIFLUCAN) 100 MG tablet Take 1 tablet (100 mg total) by mouth daily. 10 tablet 1  . Psyllium (METAMUCIL PO) Take 5 mLs by mouth daily.     Marland Kitchen sulfamethoxazole-trimethoprim (BACTRIM DS,SEPTRA DS) 800-160 MG tablet Take 1 tablet by mouth 2 (two) times daily. 14  tablet 1  . triamcinolone (KENALOG) 0.025 % cream Apply 1 application topically 4 (four) times daily. 80 g 2  . vitamin C (ASCORBIC ACID) 500 MG tablet Take 500 mg by mouth daily.     No current facility-administered medications for this visit.     OBJECTIVE: Middle-aged white woman in no acute distress  Vitals:   06/21/18 0849  BP: (!) 161/79  Pulse: 70  Resp: 18  Temp: 98.2 F (36.8 C)  SpO2: 100%     Body mass index is 18.95 kg/m.    ECOG FS:1 - Symptomatic but completely ambulatory  Sclerae unicteric, EOMs intact No cervical or supraclavicular adenopathy Lungs no rales or rhonchi Heart regular rate and rhythm Abd soft, nontender, positive bowel sounds MSK no focal spinal tenderness, no upper extremity lymphedema Neuro: nonfocal, well oriented, appropriate affect Breasts: Status post bilateral mastectomies.  There is no evidence of chest wall recurrence Skin: The rash is scattered, generally not palpable or very minimally palpable as to smooth her area of skin, erythematous, and imaged below.  It is pruritic  06/21/2018 left lower extremity      CMP     Component Value Date/Time   NA 138 06/08/2018 0918   NA 139 12/22/2016 1141   K 3.9 06/08/2018 0918   K 4.2 12/22/2016 1141   CL 102 06/08/2018 0918   CL 103 11/22/2012 1025   CO2 27 06/08/2018 0918   CO2 28 12/22/2016 1141   GLUCOSE 83 06/08/2018 0918   GLUCOSE 92 12/22/2016 1141   GLUCOSE 94 11/22/2012 1025   BUN 18 06/08/2018 0918   BUN 13.2 12/22/2016 1141   CREATININE 0.64 06/08/2018 0918   CREATININE 0.64 11/07/2017 0906   CREATININE 0.7 12/22/2016 1141   CALCIUM 9.5 06/08/2018 0918   CALCIUM 9.7 12/22/2016 1141   PROT 6.7 06/08/2018 0918   PROT 7.0 12/22/2016 1141   ALBUMIN 3.9 06/08/2018 0918   ALBUMIN 3.8 12/22/2016 1141   AST 34 06/08/2018 4665  AST 18 12/22/2016 1141   ALT 28 06/08/2018 0918   ALT 10 12/22/2016 1141   ALKPHOS 76 06/08/2018 0918   ALKPHOS 81 12/22/2016 1141   BILITOT 0.6  06/08/2018 0918   BILITOT 0.65 12/22/2016 1141   GFRNONAA >60 06/08/2018 0918   GFRNONAA 93 11/07/2017 0906   GFRAA >60 06/08/2018 0918   GFRAA 108 11/07/2017 0906    INo results found for: SPEP, UPEP  Lab Results  Component Value Date   WBC 3.8 (L) 06/21/2018   NEUTROABS 2.5 06/21/2018   HGB 10.5 (L) 06/21/2018   HCT 33.0 (L) 06/21/2018   MCV 95.9 06/21/2018   PLT 235 06/21/2018      Chemistry      Component Value Date/Time   NA 138 06/08/2018 0918   NA 139 12/22/2016 1141   K 3.9 06/08/2018 0918   K 4.2 12/22/2016 1141   CL 102 06/08/2018 0918   CL 103 11/22/2012 1025   CO2 27 06/08/2018 0918   CO2 28 12/22/2016 1141   BUN 18 06/08/2018 0918   BUN 13.2 12/22/2016 1141   CREATININE 0.64 06/08/2018 0918   CREATININE 0.64 11/07/2017 0906   CREATININE 0.7 12/22/2016 1141      Component Value Date/Time   CALCIUM 9.5 06/08/2018 0918   CALCIUM 9.7 12/22/2016 1141   ALKPHOS 76 06/08/2018 0918   ALKPHOS 81 12/22/2016 1141   AST 34 06/08/2018 0918   AST 18 12/22/2016 1141   ALT 28 06/08/2018 0918   ALT 10 12/22/2016 1141   BILITOT 0.6 06/08/2018 0918   BILITOT 0.65 12/22/2016 1141       Lab Results  Component Value Date   LABCA2 18 09/30/2009    No components found for: [id number]  No results for input(s): INR in the last 168 hours.  Urinalysis    Component Value Date/Time   BILIRUBINUR neg 09/12/2013 1012   PROTEINUR neg 09/12/2013 1012   UROBILINOGEN negative 09/12/2013 1012   NITRITE neg 09/12/2013 1012   LEUKOCYTESUR Negative 09/12/2013 1012    STUDIES: No results found.  ASSESSMENT: 67 y.o. [place], [place] woman status post right upper lobe wedge resection, middle lobe wedge resection, lower lobectomy and mediastinal lymph node dissection 06/06/2005 for a 1.2 cm grade 3 adenocarcinoma, pT1 pN1, stage Ia  (a) [facility] with every other year chest CT, most recently 06/06/2017  LEFT BREAST CANCER: (1) status post left breast upper outer quadrant  biopsy 09/02/2009 for an invasive lobular carcinoma (E-cadherin negative) estrogen receptor 98% positive, progesterone receptor 6% positive, with an MIB-1 of 14% and no HER-2 amplification. [[id number]]  (2) status post left lumpectomy and sentinel lymph node sampling 10/12/2009 for a pT1b pN1, stage IIA invasive lobular breast cancer, with negative margins.  (3) Oncotype DX score of 16 predicts a risk of outside the breast recurrence of 10% if the patient's only systemic treatment is tamoxifen for 5 years. It also predicts no benefit from adjuvant chemotherapy  (4) completed adjuvant radiation therapy 01/28/2010, receiving 5040 cGy to the left breast, with a boost to the upper inner aspect of the breast (to a cumulative dose of 6300 cGy); the axillary and supraclavicular regions received 4500 cGy  (5) started anastrozole July 2011, completedI 5 years July 2016  RIGHT BREAST CANCER: (6) status post right breast upper outer quadrant biopsy 01/29/2018 for a clinical  T1c N0, stage IA invasive ductal carcinoma, grade 2, estrogen receptor positive, progesterone receptor negative, HER-2 amplified, with an MIB-1 of 15%  (  7) status post bilateral mastectomy with right sentinel lymph node sampling 03/15/2018, showing  (a) on the left, no malignancy noted  (b) on the right, a pT1c pN0,stage IA invasive ductal carcinoma, grade 2, with negative margins.  (c) a total of 6 lymph nodes removed from the right axilla, none from the left  (8) chemotherapy consisting of paclitaxel weekly x12 started 04/26/2018, with trastuzumab to be given for 1 year  (a) myocardial perfusion study 12/20/2017 showed an ejection fraction of 75% (hyperdynamic  (b) paclitaxel switched to Abraxane with the dose #2 because of initial reaction to Taxol  (9) adjuvant radiation not indicated  (10) antiestrogens to follow at the completion of local treatment  (11) [facility]/21/2012 [facility] showed osteoporosis with a T score  of -2.5; on alendronate  (12) repeat genetics testing 02/08/2018 offered through [facility] Common Hereditary Cancers Panel and 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 There were no deleterious mutations in: APC, ATM, AXIN2, BARD1, BMPR1A, BRCA1, BRCA2, BRIP1, CDH1, CDKN2A (p14ARF), CDKN2A (p16INK4a), CKD4, CHEK2, CTNNA1, DICER1, EPCAM (Deletion/duplication testing only), GREM1 (promoter region deletion/duplication testing only), KIT, MEN1, MLH1, MSH2, MSH3, MSH6, MUTYH, NBN, NF1, NHTL1, PALB2, PDGFRA, PMS2, POLD1, POLE, PTEN, RAD50, RAD51C, RAD51D, SDHB, SDHC, SDHD, SMAD4, SMARCA4. STK11, TSC1, TSC2, and VHL.  The following genes were evaluated for sequence changes only: SDHA and HOXB13 c.251G>A variant only.  (a) see "cancer surveillance" below for details of long term Maylon Peppers related screening studies   PLAN: We are resuming the Taxol today.  I am not treating her with trastuzumab in case that is the reason she is getting this unusual rash.  I am going to treat the rash for MRSA with Bactrim twice daily for a week.  Because this frequently ends up causing a secondary fungal infection I am also starting her on Diflucan.  She will take both these drugs for the next week and then see me a week from now.  If the rash is considerably better then we will have solved the problem.  If the rash is unchanged we will add the trastuzumab to the paclitaxel/Abraxane next week.  We did review her echocardiogram which is very favorable.  She knows to call for any other issues that may develop before her next visit here.   Lennox Dolberry, Virgie Dad, MD  06/21/18 9:03 AM Medical Oncology and Hematology [facility] [address] [place], [place] [address] Tel. [phone]    Fax. [phone]   I, Jacqualyn Posey am acting as a Education administrator for Chauncey Cruel, MD.   I, Lurline Del MD, have reviewed the above documentation for accuracy and completeness, and I agree with  the above.

## 2018-06-21 ENCOUNTER — Inpatient Hospital Stay: Payer: Medicare Other

## 2018-06-21 ENCOUNTER — Inpatient Hospital Stay (HOSPITAL_BASED_OUTPATIENT_CLINIC_OR_DEPARTMENT_OTHER): Payer: Medicare Other | Admitting: Oncology

## 2018-06-21 ENCOUNTER — Telehealth: Payer: Self-pay | Admitting: Oncology

## 2018-06-21 VITALS — BP 161/79 | HR 70 | Temp 98.2°F | Resp 18 | Ht 66.0 in | Wt 117.4 lb

## 2018-06-21 DIAGNOSIS — C50212 Malignant neoplasm of upper-inner quadrant of left female breast: Secondary | ICD-10-CM | POA: Diagnosis not present

## 2018-06-21 DIAGNOSIS — R110 Nausea: Secondary | ICD-10-CM | POA: Diagnosis not present

## 2018-06-21 DIAGNOSIS — Z79899 Other long term (current) drug therapy: Secondary | ICD-10-CM | POA: Diagnosis not present

## 2018-06-21 DIAGNOSIS — M810 Age-related osteoporosis without current pathological fracture: Secondary | ICD-10-CM | POA: Diagnosis not present

## 2018-06-21 DIAGNOSIS — Z87891 Personal history of nicotine dependence: Secondary | ICD-10-CM | POA: Diagnosis not present

## 2018-06-21 DIAGNOSIS — R21 Rash and other nonspecific skin eruption: Secondary | ICD-10-CM

## 2018-06-21 DIAGNOSIS — Z170 Estrogen receptor positive status [ER+]: Secondary | ICD-10-CM

## 2018-06-21 DIAGNOSIS — Z95828 Presence of other vascular implants and grafts: Secondary | ICD-10-CM

## 2018-06-21 DIAGNOSIS — Z85118 Personal history of other malignant neoplasm of bronchus and lung: Secondary | ICD-10-CM

## 2018-06-21 DIAGNOSIS — Z5111 Encounter for antineoplastic chemotherapy: Secondary | ICD-10-CM | POA: Diagnosis not present

## 2018-06-21 LAB — CBC WITH DIFFERENTIAL/PLATELET
Abs Immature Granulocytes: 0.02 10*3/uL (ref 0.00–0.07)
Basophils Absolute: 0 10*3/uL (ref 0.0–0.1)
Basophils Relative: 1 %
Eosinophils Absolute: 0.1 10*3/uL (ref 0.0–0.5)
Eosinophils Relative: 3 %
HEMATOCRIT: 33 % — AB (ref 36.0–46.0)
Hemoglobin: 10.5 g/dL — ABNORMAL LOW (ref 12.0–15.0)
Immature Granulocytes: 1 %
Lymphocytes Relative: 21 %
Lymphs Abs: 0.8 10*3/uL (ref 0.7–4.0)
MCH: 30.5 pg (ref 26.0–34.0)
MCHC: 31.8 g/dL (ref 30.0–36.0)
MCV: 95.9 fL (ref 80.0–100.0)
Monocytes Absolute: 0.4 10*3/uL (ref 0.1–1.0)
Monocytes Relative: 9 %
Neutro Abs: 2.5 10*3/uL (ref 1.7–7.7)
Neutrophils Relative %: 65 %
Platelets: 235 10*3/uL (ref 150–400)
RBC: 3.44 MIL/uL — ABNORMAL LOW (ref 3.87–5.11)
RDW: 14.3 % (ref 11.5–15.5)
WBC: 3.8 10*3/uL — ABNORMAL LOW (ref 4.0–10.5)
nRBC: 0 % (ref 0.0–0.2)

## 2018-06-21 LAB — COMPREHENSIVE METABOLIC PANEL
ALT: 15 U/L (ref 0–44)
ANION GAP: 7 (ref 5–15)
AST: 24 U/L (ref 15–41)
Albumin: 3.9 g/dL (ref 3.5–5.0)
Alkaline Phosphatase: 83 U/L (ref 38–126)
BUN: 15 mg/dL (ref 8–23)
CO2: 29 mmol/L (ref 22–32)
Calcium: 9.2 mg/dL (ref 8.9–10.3)
Chloride: 106 mmol/L (ref 98–111)
Creatinine, Ser: 0.69 mg/dL (ref 0.44–1.00)
GFR calc Af Amer: >60 mL/min (ref >60)
GFR calc non Af Amer: >60 mL/min (ref >60)
GLUCOSE: 93 mg/dL (ref 70–99)
POTASSIUM: 4 mmol/L (ref 3.5–5.1)
Sodium: 142 mmol/L (ref 135–145)
Total Bilirubin: 0.3 mg/dL (ref 0.3–1.2)
Total Protein: 6.8 g/dL (ref 6.5–8.1)

## 2018-06-21 MED ORDER — SODIUM CHLORIDE 0.9 % IV SOLN
Freq: Once | INTRAVENOUS | Status: AC
  Administered 2018-06-21: 09:00:00 via INTRAVENOUS
  Filled 2018-06-21: qty 250

## 2018-06-21 MED ORDER — FLUCONAZOLE 100 MG PO TABS: 100.0000 mg | ORAL_TABLET | Freq: Every day | ORAL | 1 refills | Status: DC

## 2018-06-21 MED ORDER — SULFAMETHOXAZOLE-TRIMETHOPRIM 800-160 MG PO TABS: 1.0000 | ORAL_TABLET | Freq: Two times a day (BID) | ORAL | 1 refills | Status: DC

## 2018-06-21 MED ORDER — SODIUM CHLORIDE 0.9% FLUSH
10.0000 mL | INTRAVENOUS | Status: DC | PRN
  Administered 2018-06-21: 10 mL
  Filled 2018-06-21: qty 10

## 2018-06-21 MED ORDER — PACLITAXEL PROTEIN-BOUND CHEMO INJECTION 100 MG
100.0000 mg/m2 | Freq: Once | INTRAVENOUS | Status: AC
  Administered 2018-06-21: 150 mg via INTRAVENOUS
  Filled 2018-06-21: qty 30

## 2018-06-21 MED ORDER — HEPARIN SOD (PORK) LOCK FLUSH 100 UNIT/ML IV SOLN
500.0000 [IU] | Freq: Once | INTRAVENOUS | Status: AC | PRN
  Administered 2018-06-21: 500 [IU]
  Filled 2018-06-21: qty 5

## 2018-06-21 MED ORDER — PROCHLORPERAZINE MALEATE 10 MG PO TABS
ORAL_TABLET | ORAL | Status: AC
  Filled 2018-06-21: qty 1

## 2018-06-21 MED ORDER — SODIUM CHLORIDE 0.9% FLUSH
10.0000 mL | Freq: Once | INTRAVENOUS | Status: AC
  Administered 2018-06-21: 10 mL
  Filled 2018-06-21: qty 10

## 2018-06-21 MED ORDER — PROCHLORPERAZINE MALEATE 10 MG PO TABS
10.0000 mg | ORAL_TABLET | Freq: Once | ORAL | Status: AC
  Administered 2018-06-21: 10 mg via ORAL

## 2018-06-21 NOTE — Telephone Encounter (Signed)
Per 12/12 no los °

## 2018-06-21 NOTE — Patient Instructions (Signed)
[facility] Discharge Instructions for Patients Receiving Chemotherapy  Today you received the following chemotherapy agents:  Abraxane.  To help prevent nausea and vomiting after your treatment, we encourage you to take your nausea medication as directed.   If you develop nausea and vomiting that is not controlled by your nausea medication, call the clinic.   BELOW ARE SYMPTOMS THAT SHOULD BE REPORTED IMMEDIATELY:  *FEVER GREATER THAN 100.5 F  *CHILLS WITH OR WITHOUT FEVER  NAUSEA AND VOMITING THAT IS NOT CONTROLLED WITH YOUR NAUSEA MEDICATION  *UNUSUAL SHORTNESS OF BREATH  *UNUSUAL BRUISING OR BLEEDING  TENDERNESS IN MOUTH AND THROAT WITH OR WITHOUT PRESENCE OF ULCERS  *URINARY PROBLEMS  *BOWEL PROBLEMS  UNUSUAL RASH Items with * indicate a potential emergency and should be followed up as soon as possible.  Feel free to call the clinic should you have any questions or concerns. The clinic phone number is [phone].  Please show the CHEMO ALERT CARD at check-in to the Emergency Department and triage nurse.   

## 2018-06-27 NOTE — Progress Notes (Signed)
[place]  [phone] [phone]    ID: Anna Valentine DOB: [birth year]  MR#: [id number]  AYT#:[id number]  Patient Care Team: [name], MD as PCP - General (Internal Medicine) , Virgie Dad, MD as Consulting Physician (Oncology) Collene Gobble, MD as Consulting Physician (Pulmonary Disease) [name], MD as Referring Physician Armbruster, Carlota Raspberry, MD as Consulting Physician (Gastroenterology) Kem Boroughs, [place] (Family Medicine) [name], MD as Consulting Physician (Radiation Oncology) Rolm Bookbinder, MD as Consulting Physician (General Surgery)  CHIEF COMPLAINT:  (1) Estrogen receptor positive left-sided breast cancer    (2) history of stage IA right lung adenocarcinoma resected November 2006    (3) triple- positive right-sided breast cancer   CURRENT TREATMENT: Adjuvant chemo/immunotherapy  INTERVAL HISTORY: Anna Valentine returns today for follow-up of her triple positive breast cancer.   She continues on adjuvant chemotherapy/immunotherapy. She is receiving abraxane and today is the seventh of 12 planned cycles-- today is day 8 cycle 3.  She has been feeling some sharp pain in a joint in her left hand, which she is not sure whether to attribute to the chemotherapy or not. She has not had any mouth sores.   We held her trastuzumab to see if that helped with the rash. She had some rashing on her shoulder, but the rash on her lower extremities has not completely gone away.   Also she took Bactrim and Diflucan for the last week.  That does not seem to have helped.  It did make her stomach a bit queasy.   REVIEW OF SYSTEMS: Anna Valentine is doing well overal. The patient denies unusual headaches, visual changes, vomiting, or dizziness. There has been no unusual cough, phlegm production, or pleurisy. This been no change in bowel or bladder habits. The patient denies unexplained fatigue or unexplained weight loss, bleeding, or  fever. A detailed review of systems was otherwise noncontributory.    LUNG CANCER HISTORY:  From Dr. [name] original intake note 07/26/2005: (Lung cancer presentation)  "The patient is a very pleasant 67 year old female without a significant past medical history, who states that in 04/2005 she felt a lump in the left neck.  It did not resolve spontaneously, so she was seen by Dr. [name] [facility], and a CT scan of the neck was performed on 04/25/05.  There was noted to be two lymph nodes on the left, and they were normal in size, but they were asymmetrical.  No other pathologic findings were noted, except for left internal jugular vein, which was atypically positioned.  However, because of these enlarged lymph nodes, a CT scan of the chest was also obtained by Dr. Renold Genta on 04/27/05, and the CT scan did reveal a poorly defined nodular opacity medially in the right upper lobe, measuring 6.5 mg anterior to posterior.  Within the right lower lobe there was noted to be a lobular nodular mass measuring 13 x 13 mm, and was felt to be worrisome for a primary lung carcinoma.  No other nodules or effusions were seen on the left lung.  On the mediastinal window images there was slight prominence of right hilar nodes, but no other evidence of mediastinal or hilar adenopathy.  A PET scan was thereafter obtained on 05/06/05.  The PET scan did reveal increased FDG activity within a small mass in the posterior superior right lower lobe, highly suspicious for a malignancy.  No significant nodal FDG uptake was identified within the hilar regions or the mediastinum.  Also noted  was abnormal FDG uptake in the right middle lobe, corresponding to the CT scan findings, and again suspicious for a malignancy.  There was no evidence of metastatic disease within the neck, abdomen or pelvis.  The patient was then referred to [facility], and was seen by Dr. [name].  The patient underwent a right lower lobe wedge resection with  completion lobectomy and mediastinal lymph node biopsies.  The pathology revealed the following:  Right upper lobe wedge resection revealed pulmonary tissue with necrotizing granulomatous inflammation.  No evidence of malignancy.  Right middle lobe wedge revealed again pulmonary tissue with necrotizing granulomatous inflammation.  No evidence of malignancy.  The right pleural lobe (lobectomy) revealed an adenocarcinoma, poorly differentiated, grade 3, measuring 1.2 cm.  The visceral pleura was negative.  Chest wall negative.  Mediastinum negative.  All margins were negative, including the pleural and parenchymal margins.  There was no evidence of lymphovascular invasion.  In all lymph nodes sampled, including level 11, level 7, level 12, 4R and 2R lymph nodes negative for malignancy.  The patient was staged as T1 N0 M0, stage IA adenocarcinoma of the right lung.  Postoperatively the patient's course was complicated by the development of pleural effusion, requiring diuresis.  She had multiple x-rays performed.  Last x-ray performed on 07/07/05 revealed persistent right pleural effusion.  It was recommended that the patient have followups at [facility].  Dr. [name] Baxley kindly refers the patient to me today for medical oncology evaluation.  Clinically, the patient states that she has recovered well.  She is once again walking.  She was a runner, and she is trying to get back in shape."  LEFT BREAST CANCER HISTORY:From [name]'s 09/30/2009 note:  "She tells me that most recently she had her yearly screening mammogram performed that revealed an abnormality.  She also on exam was noted to have a palpable lump in the upper left breast as well.  Because of the abnormality, patient on September 02, 2009 had a digital diagnostic mammogram of the left breast and ultrasound of the left breast.  The mammogram revealed a 7.0 mm area slightly increased density in the upper left breast.  There were no  suspicious calcifications noted.  The breast was heterogeneously dense.  Patient then went onto have an ultrasound of the breast performed and again a 7.0 mm irregular hypoechoic shadowing mass was noted in the 12 o'clock position of the left breast 5.0 cm from the nipple.  There were no enlarged or abnormal left axillary lymph nodes identified.  Patient went onto have a core biopsy performed on 09/02/2009 [id number]).  The needle core biopsy of the 12 o'clock mass revealed an invasive mammary carcinoma, low to intermediate grade.  It was lobular carcinoma.  Confirmatory immunohistochemical stains revealed the tumor to be strongly positive for cytokeratin AE1-AE3 with an infiltrative pattern of growth and the tumor was negative for E-cadherin stain confirming a lobular phenotype.  The tumor was estrogen receptor positive at 98%, progesterone receptor positive 6%, proliferation marker Ki-67 by MIB was 14%.  The tumor did not express HER-2/neu by CISH with a signal of 1.04.  Patient was seen by Dr. Neldon Mc on 09/08/2009 for discussion of surgical options.  His recommendation was a lumpectomy.  Patient also had bilateral MRI of the breasts performed, which revealed a 0.8 cm mildly enlarged area of mass-like enhancement at the 12 o'clock location corresponding to the biopsy-proven breast cancer.  There was no evidence of lymphadenopathy."  PAST MEDICAL HISTORY: Past Medical History:  Diagnosis Date  . Anemia   . BRCA negative 2011   BRCA I/ II negative  . Breast cancer ([place]) 08/2009   stage 2, rx with lumpectomy and xrt  . COPD (chronic obstructive pulmonary disease) ([facility])    pt.unsure of diagnosis status  . Emphysema of lung ([place])    pt. questions diagnosis  . Family history of adverse reaction to anesthesia    My Sister has nausea  . Family history of breast cancer   . Family history of colon cancer   . Family history of lung cancer   . GERD (gastroesophageal reflux disease)    not  presently having symptoms  . Lung cancer ([place]) 06/06/05   stage 1 poorly differentiated adenocarcinoma, s/p right lower lobectomy.  . Osteoporosis   . Personal history of lung cancer   . STD (sexually transmitted disease)    HSV    PAST SURGICAL HISTORY: Past Surgical History:  Procedure Laterality Date  . BREAST BIOPSY    . BREAST LUMPECTOMY  10/12/2009   Left lumpectomy and radiation, stage II, ER/PR+, Her 2 nu negative  . BUNIONECTOMY    . COLONOSCOPY    . LOBECTOMY  06/06/2005   RLL  . MASTECTOMY W/ SENTINEL NODE BIOPSY Bilateral 03/15/2018  . MASTECTOMY W/ SENTINEL NODE BIOPSY Bilateral 03/15/2018   Procedure: BILATERAL TOTAL MASTECTOMIES WITH RIGHT SENTINEL LYMPH NODE BIOPSY;  Surgeon: Rolm Bookbinder, MD;  Location: [facility];  Service: General;  Laterality: Bilateral;  . PORTACATH PLACEMENT N/A 03/15/2018   Procedure: INSERTION PORT-A-CATH WITH [place];  Surgeon: Rolm Bookbinder, MD;  Location: [facility];  Service: General;  Laterality: N/A;  . TONSILLECTOMY    . TUBAL LIGATION  1984    FAMILY HISTORY Family History  Problem Relation Age of Onset  . Allergies Mother   . Asthma Mother   . Lung cancer Mother   . Breast cancer Mother 53       recurrence age 26  . Colon cancer Father 83  . Prostate cancer Brother 56  . Breast cancer Sister 61       Recurrence age 60 BRCA negative  . Colon polyps Sister   . Breast cancer Sister 88  . Prostate cancer Brother 69  . Breast cancer Maternal Grandmother 33  . Colon cancer Maternal Aunt   . Leukemia Maternal Grandfather   . Lung cancer Maternal Aunt   . Breast cancer Cousin   . Esophageal cancer Neg Hx   . Rectal cancer Neg Hx   . Stomach cancer Neg Hx   The patient's maternal grandmother was diagnosed with cancer at age 110. Patient's mother was diagnosed at age 30. The patient's sister was diagnosed at age 39 with recurrence at 1 and is BRCA negative. A second sister was diagnosed at age 47.  GYNECOLOGIC HISTORY:  No LMP  recorded. Patient is postmenopausal. Menarche age 56, the patient is GX P0. She went through the change of life in approximately age 10. She did not take hormone replacement.  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 It's just the 2 of them at home. She takes care of 56-year-old for a neighbor and is the primary caregiver for her husband who has had a couple of small strokes and has myelodysplasia. She helps a friend out with her business. She walks her 75 year old lab. Overall the detailed review of systems today was negativeThe patient is  a [employer]    ADVANCED DIRECTIVES: In place   HEALTH MAINTENANCE: Social History   Tobacco Use  . Smoking status: Former Smoker    Packs/day: 2.00    Years: 18.00    Pack years: 36.00    Last attempt to quit: 07/12/1987    Years since quitting: 30.9  . Smokeless tobacco: Never Used  Substance Use Topics  . Alcohol use: Yes    Alcohol/week: 4.0 standard drinks    Types: 4 Standard drinks or equivalent per week    Comment: social  . Drug use: No    Allergies  Allergen Reactions  . Clarithromycin Rash    Current Outpatient Medications  Medication Sig Dispense Refill  . acyclovir (ZOVIRAX) 400 MG tablet TAKE 1 TABLET BY MOUTH TWICE A DAY 90 tablet 3  . calcium gluconate 500 MG tablet Take 500 mg by mouth daily.    . cholecalciferol (VITAMIN D) 1000 UNITS tablet Take 1,000 Units by mouth daily.    . fluconazole (DIFLUCAN) 100 MG tablet Take 1 tablet (100 mg total) by mouth daily. 10 tablet 1  . Psyllium (METAMUCIL PO) Take 5 mLs by mouth daily.     Marland Kitchen sulfamethoxazole-trimethoprim (BACTRIM DS,SEPTRA DS) 800-160 MG tablet Take 1 tablet by mouth 2 (two) times daily. 14 tablet 1  . triamcinolone (KENALOG) 0.025 % cream Apply 1 application topically 4 (four) times daily. 80 g 2  . vitamin C (ASCORBIC ACID) 500 MG tablet Take 500 mg by mouth daily.     No current facility-administered  medications for this visit.    Facility-Administered Medications Ordered in Other Visits  Medication Dose Route Frequency Provider Last Rate Last Dose  . 0.9 %  sodium chloride infusion   Intravenous Once , Virgie Dad, MD      . heparin lock flush 100 unit/mL  500 Units Intracatheter Once PRN , Virgie Dad, MD      . PACLitaxel-protein bound (ABRAXANE) chemo infusion 150 mg  100 mg/m2 (Treatment Plan Recorded) Intravenous Once , Virgie Dad, MD      . prochlorperazine (COMPAZINE) tablet 10 mg  10 mg Oral Once , Virgie Dad, MD      . sodium chloride flush (NS) 0.9 % injection 10 mL  10 mL Intracatheter PRN , Virgie Dad, MD        OBJECTIVE: Middle-aged white woman who appears stated age  Vitals:   06/28/18 1240  BP: 139/79  Pulse: 73  Resp: 18  Temp: 98.2 F (36.8 C)  SpO2: 100%     Body mass index is 18.58 kg/m.    ECOG FS:1 - Symptomatic but completely ambulatory  Sclerae unicteric, pupils round and equal Oropharynx clear and moist No cervical or supraclavicular adenopathy Lungs no rales or rhonchi Heart regular rate and rhythm Abd soft, nontender, positive bowel sounds MSK no focal spinal tenderness, no upper extremity lymphedema Neuro: nonfocal, well oriented, appropriate affect Breasts: Status post bilateral mastectomies.  There is no evidence of chest wall recurrence.  Both axillae are benign. Skin: The rash over the legs is less erythematous, slightly duskier,.  There are a couple of spots in the upper and right shoulder anteriorly.  There is no ulceration or vesiculation   06/21/2018 left lower extremity      CMP     Component Value Date/Time   NA 137 06/28/2018 1222   NA 139 12/22/2016 1141   K 4.4 06/28/2018 1222   K 4.2 12/22/2016 1141   CL 103 06/28/2018 1222  CL 103 11/22/2012 1025   CO2 25 06/28/2018 1222   CO2 28 12/22/2016 1141   GLUCOSE 93 06/28/2018 1222   GLUCOSE 92 12/22/2016 1141   GLUCOSE 94 11/22/2012 1025   BUN  17 06/28/2018 1222   BUN 13.2 12/22/2016 1141   CREATININE 0.82 06/28/2018 1222   CREATININE 0.64 11/07/2017 0906   CREATININE 0.7 12/22/2016 1141   CALCIUM 9.6 06/28/2018 1222   CALCIUM 9.7 12/22/2016 1141   PROT 7.1 06/28/2018 1222   PROT 7.0 12/22/2016 1141   ALBUMIN 4.1 06/28/2018 1222   ALBUMIN 3.8 12/22/2016 1141   AST 28 06/28/2018 1222   AST 18 12/22/2016 1141   ALT 23 06/28/2018 1222   ALT 10 12/22/2016 1141   ALKPHOS 83 06/28/2018 1222   ALKPHOS 81 12/22/2016 1141   BILITOT 0.3 06/28/2018 1222   BILITOT 0.65 12/22/2016 1141   GFRNONAA >60 06/28/2018 1222   GFRNONAA 93 11/07/2017 0906   GFRAA >60 06/28/2018 1222   GFRAA 108 11/07/2017 0906    INo results found for: SPEP, UPEP  Lab Results  Component Value Date   WBC 2.7 (L) 06/28/2018   NEUTROABS 1.7 06/28/2018   HGB 10.2 (L) 06/28/2018   HCT 31.7 (L) 06/28/2018   MCV 96.1 06/28/2018   PLT 246 06/28/2018      Chemistry      Component Value Date/Time   NA 137 06/28/2018 1222   NA 139 12/22/2016 1141   K 4.4 06/28/2018 1222   K 4.2 12/22/2016 1141   CL 103 06/28/2018 1222   CL 103 11/22/2012 1025   CO2 25 06/28/2018 1222   CO2 28 12/22/2016 1141   BUN 17 06/28/2018 1222   BUN 13.2 12/22/2016 1141   CREATININE 0.82 06/28/2018 1222   CREATININE 0.64 11/07/2017 0906   CREATININE 0.7 12/22/2016 1141      Component Value Date/Time   CALCIUM 9.6 06/28/2018 1222   CALCIUM 9.7 12/22/2016 1141   ALKPHOS 83 06/28/2018 1222   ALKPHOS 81 12/22/2016 1141   AST 28 06/28/2018 1222   AST 18 12/22/2016 1141   ALT 23 06/28/2018 1222   ALT 10 12/22/2016 1141   BILITOT 0.3 06/28/2018 1222   BILITOT 0.65 12/22/2016 1141       Lab Results  Component Value Date   LABCA2 18 09/30/2009    No components found for: [id number]  No results for input(s): INR in the last 168 hours.  Urinalysis    Component Value Date/Time   BILIRUBINUR neg 09/12/2013 1012   PROTEINUR neg 09/12/2013 1012   UROBILINOGEN negative  09/12/2013 1012   NITRITE neg 09/12/2013 1012   LEUKOCYTESUR Negative 09/12/2013 1012    STUDIES: No results found.  ASSESSMENT: 67 y.o. [place], [place] woman status post right upper lobe wedge resection, middle lobe wedge resection, lower lobectomy and mediastinal lymph node dissection 06/06/2005 for a 1.2 cm grade 3 adenocarcinoma, pT1 pN1, stage Ia  (a) followed at [facility] with every other year chest CT, most recently 06/06/2017  LEFT BREAST CANCER: (1) status post left breast upper outer quadrant biopsy 09/02/2009 for an invasive lobular carcinoma (E-cadherin negative) estrogen receptor 98% positive, progesterone receptor 6% positive, with an MIB-1 of 14% and no HER-2 amplification. [[id number] [id number]]  (2) status post left lumpectomy and sentinel lymph node sampling 10/12/2009 for a pT1b pN1, stage IIA invasive lobular breast cancer, with negative margins.  (3) Oncotype DX score of 16 predicts a risk of outside the breast recurrence of 10% if the patient's  only systemic treatment is tamoxifen for 5 years. It also predicts no benefit from adjuvant chemotherapy  (4) completed adjuvant radiation therapy 01/28/2010, receiving 5040 cGy to the left breast, with a boost to the upper inner aspect of the breast (to a cumulative dose of 6300 cGy); the axillary and supraclavicular regions received 4500 cGy  (5) started anastrozole July 2011, completedI 5 years July 2016  RIGHT BREAST CANCER: (6) status post right breast upper outer quadrant biopsy 01/29/2018 for a clinical  T1c N0, stage IA invasive ductal carcinoma, grade 2, estrogen receptor positive, progesterone receptor negative, HER-2 amplified, with an MIB-1 of 15%  (7) status post bilateral mastectomy with right sentinel lymph node sampling 03/15/2018, showing  (a) on the left, no malignancy noted  (b) on the right, a pT1c pN0,stage IA invasive ductal carcinoma, grade 2, with negative margins.  (c) a total of 6 lymph nodes removed from the right  axilla, none from the left  (8) chemotherapy consisting of paclitaxel weekly x12 started 04/26/2018, with trastuzumab to be given for 1 year  (a) myocardial perfusion study 12/20/2017 showed an ejection fraction of 75% (hyperdynamic  (b) paclitaxel switched to Abraxane with the dose #2 because of initial reaction to Taxol  (9) adjuvant radiation not indicated  (10) antiestrogens to follow at the completion of local treatment  (11) bone density 02/21/2012 at [facility] showed osteoporosis with a T score of -2.5; on alendronate  (12) repeat genetics testing 02/08/2018 offered through [facility] Common Hereditary Cancers Panel and 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 There were no deleterious mutations in: APC, ATM, AXIN2, BARD1, BMPR1A, BRCA1, BRCA2, BRIP1, CDH1, CDKN2A (p14ARF), CDKN2A (p16INK4a), CKD4, CHEK2, CTNNA1, DICER1, EPCAM (Deletion/duplication testing only), GREM1 (promoter region deletion/duplication testing only), KIT, MEN1, MLH1, MSH2, MSH3, MSH6, MUTYH, NBN, NF1, NHTL1, PALB2, PDGFRA, PMS2, POLD1, POLE, PTEN, RAD50, RAD51C, RAD51D, SDHB, SDHC, SDHD, SMAD4, SMARCA4. STK11, TSC1, TSC2, and VHL.  The following genes were evaluated for sequence changes only: SDHA and HOXB13 c.251G>A variant only.  (a) see "cancer surveillance" below for details of long term Maylon Peppers related screening studies   PLAN: Anna Valentine's rash is possibly slightly better, but she also has a couple of small new spots which means we really have not hit it in terms of diagnosis and treatment of this problem.  We are continuing the Taxol today.  However she really needs to receive the trastuzumab which is more important than the Taxol in the setting.  I am holding it for now but I am hoping to be able to get it we started within the next 2 weeks  I contacted Dr. Pamala Duffel and asked him if he could possibly work this patient in.  He will try to see her tomorrow.  She will return in 1  week and she will also receive Taxol alone at that time unless we have a different directive from dermatology.  I have added additional visits and she will see [place] on January 2 in January 9.  The good news is that so far she has not developed any neuropathy.     , Virgie Dad, MD  06/28/18 1:34 PM Medical Oncology and Hematology [facility] [address] [place], [place] [address] Tel. [phone]    Fax. [phone]   I, Jacqualyn Posey am acting as a Education administrator for Chauncey Cruel, MD.   I, Lurline Del MD, have reviewed the above documentation for accuracy and completeness, and I agree with the  above.

## 2018-06-28 ENCOUNTER — Inpatient Hospital Stay (HOSPITAL_BASED_OUTPATIENT_CLINIC_OR_DEPARTMENT_OTHER): Payer: Medicare Other | Admitting: Oncology

## 2018-06-28 ENCOUNTER — Inpatient Hospital Stay: Payer: Medicare Other

## 2018-06-28 VITALS — BP 139/79 | HR 73 | Temp 98.2°F | Resp 18 | Ht 66.0 in | Wt 115.1 lb

## 2018-06-28 DIAGNOSIS — C50212 Malignant neoplasm of upper-inner quadrant of left female breast: Secondary | ICD-10-CM

## 2018-06-28 DIAGNOSIS — Z170 Estrogen receptor positive status [ER+]: Secondary | ICD-10-CM | POA: Diagnosis not present

## 2018-06-28 DIAGNOSIS — Z95828 Presence of other vascular implants and grafts: Secondary | ICD-10-CM

## 2018-06-28 DIAGNOSIS — Z79899 Other long term (current) drug therapy: Secondary | ICD-10-CM

## 2018-06-28 DIAGNOSIS — M810 Age-related osteoporosis without current pathological fracture: Secondary | ICD-10-CM

## 2018-06-28 DIAGNOSIS — R110 Nausea: Secondary | ICD-10-CM

## 2018-06-28 DIAGNOSIS — C3431 Malignant neoplasm of lower lobe, right bronchus or lung: Secondary | ICD-10-CM

## 2018-06-28 DIAGNOSIS — Z923 Personal history of irradiation: Secondary | ICD-10-CM | POA: Diagnosis not present

## 2018-06-28 DIAGNOSIS — R21 Rash and other nonspecific skin eruption: Secondary | ICD-10-CM | POA: Diagnosis not present

## 2018-06-28 DIAGNOSIS — Z85118 Personal history of other malignant neoplasm of bronchus and lung: Secondary | ICD-10-CM

## 2018-06-28 DIAGNOSIS — Z87891 Personal history of nicotine dependence: Secondary | ICD-10-CM

## 2018-06-28 DIAGNOSIS — Z5111 Encounter for antineoplastic chemotherapy: Secondary | ICD-10-CM | POA: Diagnosis not present

## 2018-06-28 LAB — CBC WITH DIFFERENTIAL/PLATELET
Abs Immature Granulocytes: 0.03 10*3/uL (ref 0.00–0.07)
BASOS PCT: 0 %
Basophils Absolute: 0 10*3/uL (ref 0.0–0.1)
EOS ABS: 0.1 10*3/uL (ref 0.0–0.5)
EOS PCT: 3 %
HCT: 31.7 % — ABNORMAL LOW (ref 36.0–46.0)
Hemoglobin: 10.2 g/dL — ABNORMAL LOW (ref 12.0–15.0)
Immature Granulocytes: 1 %
Lymphocytes Relative: 24 %
Lymphs Abs: 0.7 10*3/uL (ref 0.7–4.0)
MCH: 30.9 pg (ref 26.0–34.0)
MCHC: 32.2 g/dL (ref 30.0–36.0)
MCV: 96.1 fL (ref 80.0–100.0)
Monocytes Absolute: 0.3 10*3/uL (ref 0.1–1.0)
Monocytes Relative: 9 %
Neutro Abs: 1.7 10*3/uL (ref 1.7–7.7)
Neutrophils Relative %: 63 %
PLATELETS: 246 10*3/uL (ref 150–400)
RBC: 3.3 MIL/uL — ABNORMAL LOW (ref 3.87–5.11)
RDW: 14.1 % (ref 11.5–15.5)
WBC: 2.7 10*3/uL — ABNORMAL LOW (ref 4.0–10.5)
nRBC: 0 % (ref 0.0–0.2)

## 2018-06-28 LAB — COMPREHENSIVE METABOLIC PANEL
ALT: 23 U/L (ref 0–44)
AST: 28 U/L (ref 15–41)
Albumin: 4.1 g/dL (ref 3.5–5.0)
Alkaline Phosphatase: 83 U/L (ref 38–126)
Anion gap: 9 (ref 5–15)
BILIRUBIN TOTAL: 0.3 mg/dL (ref 0.3–1.2)
BUN: 17 mg/dL (ref 8–23)
CO2: 25 mmol/L (ref 22–32)
Calcium: 9.6 mg/dL (ref 8.9–10.3)
Chloride: 103 mmol/L (ref 98–111)
Creatinine, Ser: 0.82 mg/dL (ref 0.44–1.00)
GFR calc Af Amer: >60 mL/min (ref >60)
GFR calc non Af Amer: >60 mL/min (ref >60)
Glucose, Bld: 93 mg/dL (ref 70–99)
Potassium: 4.4 mmol/L (ref 3.5–5.1)
Sodium: 137 mmol/L (ref 135–145)
TOTAL PROTEIN: 7.1 g/dL (ref 6.5–8.1)

## 2018-06-28 MED ORDER — SODIUM CHLORIDE 0.9 % IV SOLN
Freq: Once | INTRAVENOUS | Status: AC
  Administered 2018-06-28: 14:00:00 via INTRAVENOUS
  Filled 2018-06-28: qty 250

## 2018-06-28 MED ORDER — PACLITAXEL PROTEIN-BOUND CHEMO INJECTION 100 MG
100.0000 mg/m2 | Freq: Once | INTRAVENOUS | Status: AC
  Administered 2018-06-28: 150 mg via INTRAVENOUS
  Filled 2018-06-28: qty 30

## 2018-06-28 MED ORDER — PROCHLORPERAZINE MALEATE 10 MG PO TABS
ORAL_TABLET | ORAL | Status: AC
  Filled 2018-06-28: qty 1

## 2018-06-28 MED ORDER — SODIUM CHLORIDE 0.9% FLUSH
10.0000 mL | Freq: Once | INTRAVENOUS | Status: AC
  Administered 2018-06-28: 10 mL
  Filled 2018-06-28: qty 10

## 2018-06-28 MED ORDER — PROCHLORPERAZINE MALEATE 10 MG PO TABS
10.0000 mg | ORAL_TABLET | Freq: Once | ORAL | Status: AC
  Administered 2018-06-28: 10 mg via ORAL

## 2018-06-28 MED ORDER — HEPARIN SOD (PORK) LOCK FLUSH 100 UNIT/ML IV SOLN
500.0000 [IU] | Freq: Once | INTRAVENOUS | Status: AC | PRN
  Administered 2018-06-28: 500 [IU]
  Filled 2018-06-28: qty 5

## 2018-06-28 MED ORDER — SODIUM CHLORIDE 0.9% FLUSH
10.0000 mL | INTRAVENOUS | Status: DC | PRN
  Administered 2018-06-28: 10 mL
  Filled 2018-06-28: qty 10

## 2018-06-28 NOTE — Patient Instructions (Signed)
[facility] Discharge Instructions for Patients Receiving Chemotherapy  Today you received the following chemotherapy agents:  Abraxane.  To help prevent nausea and vomiting after your treatment, we encourage you to take your nausea medication as directed.   If you develop nausea and vomiting that is not controlled by your nausea medication, call the clinic.   BELOW ARE SYMPTOMS THAT SHOULD BE REPORTED IMMEDIATELY:  *FEVER GREATER THAN 100.5 F  *CHILLS WITH OR WITHOUT FEVER  NAUSEA AND VOMITING THAT IS NOT CONTROLLED WITH YOUR NAUSEA MEDICATION  *UNUSUAL SHORTNESS OF BREATH  *UNUSUAL BRUISING OR BLEEDING  TENDERNESS IN MOUTH AND THROAT WITH OR WITHOUT PRESENCE OF ULCERS  *URINARY PROBLEMS  *BOWEL PROBLEMS  UNUSUAL RASH Items with * indicate a potential emergency and should be followed up as soon as possible.  Feel free to call the clinic should you have any questions or concerns. The clinic phone number is [phone].  Please show the CHEMO ALERT CARD at check-in to the Emergency Department and triage nurse.   

## 2018-06-29 ENCOUNTER — Other Ambulatory Visit: Payer: Self-pay | Admitting: Dermatology

## 2018-06-29 DIAGNOSIS — L271 Localized skin eruption due to drugs and medicaments taken internally: Secondary | ICD-10-CM | POA: Diagnosis not present

## 2018-06-29 DIAGNOSIS — D485 Neoplasm of uncertain behavior of skin: Secondary | ICD-10-CM | POA: Diagnosis not present

## 2018-06-29 DIAGNOSIS — L308 Other specified dermatitis: Secondary | ICD-10-CM | POA: Diagnosis not present

## 2018-07-03 ENCOUNTER — Telehealth: Payer: Self-pay | Admitting: Oncology

## 2018-07-03 NOTE — Telephone Encounter (Signed)
Called regarding 1/2

## 2018-07-06 ENCOUNTER — Telehealth: Payer: Self-pay

## 2018-07-06 ENCOUNTER — Inpatient Hospital Stay (HOSPITAL_BASED_OUTPATIENT_CLINIC_OR_DEPARTMENT_OTHER): Payer: Medicare Other | Admitting: Adult Health

## 2018-07-06 ENCOUNTER — Inpatient Hospital Stay: Payer: Medicare Other

## 2018-07-06 ENCOUNTER — Encounter: Payer: Self-pay | Admitting: Adult Health

## 2018-07-06 VITALS — BP 173/83 | HR 73 | Temp 98.2°F | Resp 18 | Ht 66.0 in | Wt 121.2 lb

## 2018-07-06 DIAGNOSIS — Z923 Personal history of irradiation: Secondary | ICD-10-CM

## 2018-07-06 DIAGNOSIS — Z170 Estrogen receptor positive status [ER+]: Secondary | ICD-10-CM

## 2018-07-06 DIAGNOSIS — Z85118 Personal history of other malignant neoplasm of bronchus and lung: Secondary | ICD-10-CM | POA: Diagnosis not present

## 2018-07-06 DIAGNOSIS — C50212 Malignant neoplasm of upper-inner quadrant of left female breast: Secondary | ICD-10-CM

## 2018-07-06 DIAGNOSIS — R21 Rash and other nonspecific skin eruption: Secondary | ICD-10-CM

## 2018-07-06 DIAGNOSIS — Z87891 Personal history of nicotine dependence: Secondary | ICD-10-CM

## 2018-07-06 DIAGNOSIS — Z95828 Presence of other vascular implants and grafts: Secondary | ICD-10-CM

## 2018-07-06 DIAGNOSIS — M810 Age-related osteoporosis without current pathological fracture: Secondary | ICD-10-CM

## 2018-07-06 DIAGNOSIS — Z5111 Encounter for antineoplastic chemotherapy: Secondary | ICD-10-CM | POA: Diagnosis not present

## 2018-07-06 DIAGNOSIS — R110 Nausea: Secondary | ICD-10-CM | POA: Diagnosis not present

## 2018-07-06 DIAGNOSIS — Z79899 Other long term (current) drug therapy: Secondary | ICD-10-CM

## 2018-07-06 LAB — COMPREHENSIVE METABOLIC PANEL
ALT: 16 U/L (ref 0–44)
ANION GAP: 6 (ref 5–15)
AST: 20 U/L (ref 15–41)
Albumin: 3.4 g/dL — ABNORMAL LOW (ref 3.5–5.0)
Alkaline Phosphatase: 75 U/L (ref 38–126)
BUN: 17 mg/dL (ref 8–23)
CO2: 28 mmol/L (ref 22–32)
Calcium: 8.8 mg/dL — ABNORMAL LOW (ref 8.9–10.3)
Chloride: 107 mmol/L (ref 98–111)
Creatinine, Ser: 0.67 mg/dL (ref 0.44–1.00)
GFR calc Af Amer: >60 mL/min (ref >60)
GFR calc non Af Amer: >60 mL/min (ref >60)
Glucose, Bld: 98 mg/dL (ref 70–99)
POTASSIUM: 4.4 mmol/L (ref 3.5–5.1)
Sodium: 141 mmol/L (ref 135–145)
Total Bilirubin: 0.3 mg/dL (ref 0.3–1.2)
Total Protein: 6.3 g/dL — ABNORMAL LOW (ref 6.5–8.1)

## 2018-07-06 LAB — CBC WITH DIFFERENTIAL/PLATELET
Abs Immature Granulocytes: 0.02 10*3/uL (ref 0.00–0.07)
Basophils Absolute: 0 10*3/uL (ref 0.0–0.1)
Basophils Relative: 1 %
Eosinophils Absolute: 0.1 10*3/uL (ref 0.0–0.5)
Eosinophils Relative: 3 %
HCT: 30 % — ABNORMAL LOW (ref 36.0–46.0)
Hemoglobin: 9.5 g/dL — ABNORMAL LOW (ref 12.0–15.0)
Immature Granulocytes: 1 %
LYMPHS ABS: 0.6 10*3/uL — AB (ref 0.7–4.0)
LYMPHS PCT: 32 %
MCH: 30.8 pg (ref 26.0–34.0)
MCHC: 31.7 g/dL (ref 30.0–36.0)
MCV: 97.4 fL (ref 80.0–100.0)
Monocytes Absolute: 0.3 10*3/uL (ref 0.1–1.0)
Monocytes Relative: 14 %
NRBC: 0 % (ref 0.0–0.2)
Neutro Abs: 1 10*3/uL — ABNORMAL LOW (ref 1.7–7.7)
Neutrophils Relative %: 49 %
Platelets: 238 10*3/uL (ref 150–400)
RBC: 3.08 MIL/uL — ABNORMAL LOW (ref 3.87–5.11)
RDW: 13.8 % (ref 11.5–15.5)
WBC: 2 10*3/uL — ABNORMAL LOW (ref 4.0–10.5)

## 2018-07-06 MED ORDER — SODIUM CHLORIDE 0.9% FLUSH
10.0000 mL | Freq: Once | INTRAVENOUS | Status: AC
  Administered 2018-07-06: 10 mL
  Filled 2018-07-06: qty 10

## 2018-07-06 MED ORDER — HEPARIN SOD (PORK) LOCK FLUSH 100 UNIT/ML IV SOLN
500.0000 [IU] | Freq: Once | INTRAVENOUS | Status: AC | PRN
  Administered 2018-07-06: 500 [IU]
  Filled 2018-07-06: qty 5

## 2018-07-06 MED ORDER — SODIUM CHLORIDE 0.9% FLUSH
10.0000 mL | INTRAVENOUS | Status: DC | PRN
  Administered 2018-07-06: 10 mL
  Filled 2018-07-06: qty 10

## 2018-07-06 MED ORDER — PACLITAXEL PROTEIN-BOUND CHEMO INJECTION 100 MG
100.0000 mg/m2 | Freq: Once | INTRAVENOUS | Status: AC
  Administered 2018-07-06: 150 mg via INTRAVENOUS
  Filled 2018-07-06: qty 30

## 2018-07-06 MED ORDER — ALTEPLASE 2 MG IJ SOLR
INTRAMUSCULAR | Status: AC
  Filled 2018-07-06: qty 2

## 2018-07-06 MED ORDER — SODIUM CHLORIDE 0.9 % IV SOLN
Freq: Once | INTRAVENOUS | Status: AC
  Administered 2018-07-06: 12:00:00 via INTRAVENOUS
  Filled 2018-07-06: qty 250

## 2018-07-06 MED ORDER — PROCHLORPERAZINE MALEATE 10 MG PO TABS
10.0000 mg | ORAL_TABLET | Freq: Once | ORAL | Status: AC
  Administered 2018-07-06: 10 mg via ORAL

## 2018-07-06 MED ORDER — PROCHLORPERAZINE MALEATE 10 MG PO TABS
ORAL_TABLET | ORAL | Status: AC
  Filled 2018-07-06: qty 1

## 2018-07-06 NOTE — Progress Notes (Signed)
Ok to treat today with ANC of 1.0.    Anna Bihari, NP

## 2018-07-06 NOTE — Progress Notes (Signed)
[place]  [phone] Fax:([phone]) [phone]    ID: CARMITA BOOM DOB: 03-26-1951  MR#: [id number]  TIW#:[id number]  Patient Care Team: [name], MD as PCP - General (Internal Medicine) Magrinat, Virgie Dad, MD as Consulting Physician (Oncology) Collene Gobble, MD as Consulting Physician (Pulmonary Disease) [name], MD as Referring Physician Armbruster, Carlota Raspberry, MD as Consulting Physician (Gastroenterology) Kem Boroughs, [place] (Family Medicine) [name], MD as Consulting Physician (Radiation Oncology) Rolm Bookbinder, MD as Consulting Physician (General Surgery)  CHIEF COMPLAINT:  (1) Estrogen receptor positive left-sided breast cancer    (2) history of stage IA right lung adenocarcinoma resected November 2006    (3) triple- positive right-sided breast cancer   CURRENT TREATMENT: Adjuvant chemo/immunotherapy  INTERVAL HISTORY: Khaleesi returns today for follow-up of her triple positive breast cancer.   She continues on adjuvant chemotherapy/immunotherapy. She is receiving abraxane and today is the ninth of 12 planned cycles-- today is day 15 cycle 3. We are currently holding the Trastuzumab due to rash.  She has seen dermatologist, Dr. Denna Haggard for evaluation of this rash and underwent biopsy.  She is expecting to get the results from him about this today.  She is requesting to have her treatment appointment moved to earlier on 07/12/17 due to needing to get home between 4 and 430 to take her dog on a walk.     REVIEW OF SYSTEMS: Bentleigh is tolerating her treatment well.  She did have one episode of knee and ankle pain on Christmas Eve.  This happened at night while she was in bed, but hasn't happened before or since.  She is unsure what caused it, but it did go away after she got up and moved around.  She also has noted some tingling in her fingertips with running warm water over her hands.  She denies any peripheral neuropathy at any  other time point.    Chelcee has not had any unusual headaches, dysphagia, dysguesia.  She is without chest pain, cough, palpatiations, shortness of breath.  She denies abdominal pain, nausea, vomiting, or bowel/bladder changes.  A detailed ROS was conducted and was otherwise non contributory.    LUNG CANCER HISTORY:  From Dr. [name] original intake note 07/26/2005: (Lung cancer presentation)  "The patient is a very pleasant 67 year old female without a significant past medical history, who states that in 04/2005 she felt a lump in the left neck.  It did not resolve spontaneously, so she was seen by Dr. [name] [facility], and a CT scan of the neck was performed on 04/25/05.  There was noted to be two lymph nodes on the left, and they were normal in size, but they were asymmetrical.  No other pathologic findings were noted, except for left internal jugular vein, which was atypically positioned.  However, because of these enlarged lymph nodes, a CT scan of the chest was also obtained by Dr. Renold Genta on 04/27/05, and the CT scan did reveal a poorly defined nodular opacity medially in the right upper lobe, measuring 6.5 mg anterior to posterior.  Within the right lower lobe there was noted to be a lobular nodular mass measuring 13 x 13 mm, and was felt to be worrisome for a primary lung carcinoma.  No other nodules or effusions were seen on the left lung.  On the mediastinal window images there was slight prominence of right hilar nodes, but no other evidence of mediastinal or hilar adenopathy.  A PET scan was thereafter  obtained on 05/06/05.  The PET scan did reveal increased FDG activity within a small mass in the posterior superior right lower lobe, highly suspicious for a malignancy.  No significant nodal FDG uptake was identified within the hilar regions or the mediastinum.  Also noted was abnormal FDG uptake in the right middle lobe, corresponding to the CT scan findings, and again suspicious for a  malignancy.  There was no evidence of metastatic disease within the neck, abdomen or pelvis.  The patient was then referred to [facility], and was seen by Dr. [name].  The patient underwent a right lower lobe wedge resection with completion lobectomy and mediastinal lymph node biopsies.  The pathology revealed the following:  Right upper lobe wedge resection revealed pulmonary tissue with necrotizing granulomatous inflammation.  No evidence of malignancy.  Right middle lobe wedge revealed again pulmonary tissue with necrotizing granulomatous inflammation.  No evidence of malignancy.  The right pleural lobe (lobectomy) revealed an adenocarcinoma, poorly differentiated, grade 3, measuring 1.2 cm.  The visceral pleura was negative.  Chest wall negative.  Mediastinum negative.  All margins were negative, including the pleural and parenchymal margins.  There was no evidence of lymphovascular invasion.  In all lymph nodes sampled, including level 11, level 7, level 12, 4R and 2R lymph nodes negative for malignancy.  The patient was staged as T1 N0 M0, stage IA adenocarcinoma of the right lung.  Postoperatively the patient's course was complicated by the development of pleural effusion, requiring diuresis.  She had multiple x-rays performed.  Last x-ray performed on 07/07/05 revealed persistent right pleural effusion.  It was recommended that the patient have followups at [facility].  Dr. [name] Baxley kindly refers the patient to me today for medical oncology evaluation.  Clinically, the patient states that she has recovered well.  She is once again walking.  She was a runner, and she is trying to get back in shape."  LEFT BREAST CANCER HISTORY:From [name]'s 09/30/2009 note:  "She tells me that most recently she had her yearly screening mammogram performed that revealed an abnormality.  She also on exam was noted to have a palpable lump in the upper left breast as well.  Because of the abnormality, patient  on September 02, 2009 had a digital diagnostic mammogram of the left breast and ultrasound of the left breast.  The mammogram revealed a 7.0 mm area slightly increased density in the upper left breast.  There were no suspicious calcifications noted.  The breast was heterogeneously dense.  Patient then went onto have an ultrasound of the breast performed and again a 7.0 mm irregular hypoechoic shadowing mass was noted in the 12 o'clock position of the left breast 5.0 cm from the nipple.  There were no enlarged or abnormal left axillary lymph nodes identified.  Patient went onto have a core biopsy performed on 09/02/2009 [phone]).  The needle core biopsy of the 12 o'clock mass revealed an invasive mammary carcinoma, low to intermediate grade.  It was lobular carcinoma.  Confirmatory immunohistochemical stains revealed the tumor to be strongly positive for cytokeratin AE1-AE3 with an infiltrative pattern of growth and the tumor was negative for E-cadherin stain confirming a lobular phenotype.  The tumor was estrogen receptor positive at 98%, progesterone receptor positive 6%, proliferation marker Ki-67 by MIB was 14%.  The tumor did not express HER-2/neu by CISH with a signal of 1.04.  Patient was seen by Dr. Neldon Mc on 09/08/2009 for discussion of surgical options.  His recommendation was a lumpectomy.  Patient also had bilateral MRI of the breasts performed, which revealed a 0.8 cm mildly enlarged area of mass-like enhancement at the 12 o'clock location corresponding to the biopsy-proven breast cancer.  There was no evidence of lymphadenopathy."   PAST MEDICAL HISTORY: Past Medical History:  Diagnosis Date  . Anemia   . BRCA negative 2011   BRCA I/ II negative  . Breast cancer ([place]) 08/2009   stage 2, rx with lumpectomy and xrt  . COPD (chronic obstructive pulmonary disease) (H[facility])    pt.unsure of diagnosis status  . Emphysema of lung ([place])    pt. questions diagnosis  . Family history of  adverse reaction to anesthesia    My Sister has nausea  . Family history of breast cancer   . Family history of colon cancer   . Family history of lung cancer   . GERD (gastroesophageal reflux disease)    not presently having symptoms  . Lung cancer ([place]) 06/06/05   stage 1 poorly differentiated adenocarcinoma, s/p right lower lobectomy.  . Osteoporosis   . Personal history of lung cancer   . STD (sexually transmitted disease)    HSV    PAST SURGICAL HISTORY: Past Surgical History:  Procedure Laterality Date  . BREAST BIOPSY    . BREAST LUMPECTOMY  10/12/2009   Left lumpectomy and radiation, stage II, ER/PR+, Her 2 nu negative  . BUNIONECTOMY    . COLONOSCOPY    . LOBECTOMY  06/06/2005   RLL  . MASTECTOMY W/ SENTINEL NODE BIOPSY Bilateral 03/15/2018  . MASTECTOMY W/ SENTINEL NODE BIOPSY Bilateral 03/15/2018   Procedure: BILATERAL TOTAL MASTECTOMIES WITH RIGHT SENTINEL LYMPH NODE BIOPSY;  Surgeon: Rolm Bookbinder, MD;  Location: [place];  Service: General;  Laterality: Bilateral;  . PORTACATH PLACEMENT N/A 03/15/2018   Procedure: INSERTION PORT-A-CATH WITH [place];  Surgeon: Rolm Bookbinder, MD;  Location: [place];  Service: General;  Laterality: N/A;  . TONSILLECTOMY    . TUBAL LIGATION  1984    FAMILY HISTORY Family History  Problem Relation Age of Onset  . Allergies Mother   . Asthma Mother   . Lung cancer Mother   . Breast cancer Mother 42       recurrence age 49  . Colon cancer Father 72  . Prostate cancer Brother 56  . Breast cancer Sister 40       Recurrence age 44 BRCA negative  . Colon polyps Sister   . Breast cancer Sister 73  . Prostate cancer Brother 44  . Breast cancer Maternal Grandmother 66  . Colon cancer Maternal Aunt   . Leukemia Maternal Grandfather   . Lung cancer Maternal Aunt   . Breast cancer Cousin   . Esophageal cancer Neg Hx   . Rectal cancer Neg Hx   . Stomach cancer Neg Hx   The patient's maternal grandmother was diagnosed with cancer at  age 25. Patient's mother was diagnosed at age 76. The patient's sister was diagnosed at age 35 with recurrence at 63 and is BRCA negative. A second sister was diagnosed at age 71.  GYNECOLOGIC HISTORY:  No LMP recorded. Patient is postmenopausal. Menarche age 60, the patient is GX P0. She went through the change of life in approximately age 80. She did not take hormone replacement.  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 It's just the 2 of them at home. She takes care of 24-year-old  for a neighbor and is the primary caregiver for her husband who has had a couple of small strokes and has myelodysplasia. She helps a friend out with her business. She walks her 22 year old lab. 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 Smoking status: Former Smoker    Packs/day: 2.00    Years: 18.00    Pack years: 36.00    Last attempt to quit: 07/12/1987    Years since quitting: 31.0  . Smokeless tobacco: Never Used  Substance Use Topics  . Alcohol use: Yes    Alcohol/week: 4.0 standard drinks    Types: 4 Standard drinks or equivalent per week    Comment: social  . Drug use: No    Allergies  Allergen Reactions  . Clarithromycin Rash    Current Outpatient Medications  Medication Sig Dispense Refill  . acyclovir (ZOVIRAX) 400 MG tablet TAKE 1 TABLET BY MOUTH TWICE A DAY 90 tablet 3  . calcium gluconate 500 MG tablet Take 500 mg by mouth daily.    . cholecalciferol (VITAMIN D) 1000 UNITS tablet Take 1,000 Units by mouth daily.    . Psyllium (METAMUCIL PO) Take 5 mLs by mouth daily.     Marland Kitchen triamcinolone (KENALOG) 0.025 % cream Apply 1 application topically 4 (four) times daily. 80 g 2  . vitamin C (ASCORBIC ACID) 500 MG tablet Take 500 mg by mouth daily.     No current facility-administered medications  for this visit.     OBJECTIVE:   Vitals:   07/06/18 1114  BP: (!) 173/83  Pulse: 73  Resp: 18  Temp: 98.2 F (36.8 C)  SpO2: 100%     Body mass index is 19.56 kg/m.    ECOG FS:1 - Symptomatic but completely ambulatory  GENERAL: Patient is a well appearing female in no acute distress HEENT:  Sclerae anicteric.  Oropharynx clear and moist. No ulcerations or evidence of oropharyngeal candidiasis. Neck is supple.  NODES:  No cervical, supraclavicular, or axillary lymphadenopathy palpated.  BREAST EXAM:  S/p bilateral mastectomies, no sign of local recurrence LUNGS:  Clear to auscultation bilaterally.  No wheezes or rhonchi. HEART:  Regular rate and rhythm. No murmur appreciated. ABDOMEN:  Soft, nontender.  Positive, normoactive bowel sounds. No organomegaly palpated. MSK:  No focal spinal tenderness to palpation. Full range of motion bilaterally in the upper extremities. EXTREMITIES:  No peripheral edema.   SKIN:  Clear with no obvious rashes or skin changes. No nail dyscrasia.  I examined her upper back which has some areas of scabbing, where she had some previous skin lesions, but nothing rash like was noted. NEURO:  Nonfocal. Well oriented.  Appropriate affect.           CMP     Component Value Date/Time   NA 137 06/28/2018 1222   NA 139 12/22/2016 1141   K 4.4 06/28/2018 1222   K 4.2 12/22/2016 1141   CL 103 06/28/2018 1222   CL 103 11/22/2012 1025   CO2 25 06/28/2018 1222   CO2 28 12/22/2016 1141   GLUCOSE 93 06/28/2018 1222   GLUCOSE 92 12/22/2016 1141   GLUCOSE 94 11/22/2012 1025   BUN 17 06/28/2018 1222   BUN 13.2 12/22/2016 1141   CREATININE 0.82 06/28/2018 1222   CREATININE 0.64 11/07/2017 0906   CREATININE 0.7 12/22/2016 1141   CALCIUM 9.6 06/28/2018  1222   CALCIUM 9.7 12/22/2016 1141   PROT 7.1 06/28/2018 1222   PROT 7.0 12/22/2016 1141   ALBUMIN 4.1 06/28/2018 1222   ALBUMIN 3.8 12/22/2016 1141   AST 28 06/28/2018 1222   AST 18 12/22/2016 1141    ALT 23 06/28/2018 1222   ALT 10 12/22/2016 1141   ALKPHOS 83 06/28/2018 1222   ALKPHOS 81 12/22/2016 1141   BILITOT 0.3 06/28/2018 1222   BILITOT 0.65 12/22/2016 1141   GFRNONAA >60 06/28/2018 1222   GFRNONAA 93 11/07/2017 0906   GFRAA >60 06/28/2018 1222   GFRAA 108 11/07/2017 0906    INo results found for: SPEP, UPEP  Lab Results  Component Value Date   WBC 2.0 (L) 07/06/2018   NEUTROABS 1.0 (L) 07/06/2018   HGB 9.5 (L) 07/06/2018   HCT 30.0 (L) 07/06/2018   MCV 97.4 07/06/2018   PLT 238 07/06/2018      Chemistry      Component Value Date/Time   NA 137 06/28/2018 1222   NA 139 12/22/2016 1141   K 4.4 06/28/2018 1222   K 4.2 12/22/2016 1141   CL 103 06/28/2018 1222   CL 103 11/22/2012 1025   CO2 25 06/28/2018 1222   CO2 28 12/22/2016 1141   BUN 17 06/28/2018 1222   BUN 13.2 12/22/2016 1141   CREATININE 0.82 06/28/2018 1222   CREATININE 0.64 11/07/2017 0906   CREATININE 0.7 12/22/2016 1141      Component Value Date/Time   CALCIUM 9.6 06/28/2018 1222   CALCIUM 9.7 12/22/2016 1141   ALKPHOS 83 06/28/2018 1222   ALKPHOS 81 12/22/2016 1141   AST 28 06/28/2018 1222   AST 18 12/22/2016 1141   ALT 23 06/28/2018 1222   ALT 10 12/22/2016 1141   BILITOT 0.3 06/28/2018 1222   BILITOT 0.65 12/22/2016 1141       Lab Results  Component Value Date   LABCA2 18 09/30/2009    No components found for: [id number]  No results for input(s): INR in the last 168 hours.  Urinalysis    Component Value Date/Time   BILIRUBINUR neg 09/12/2013 1012   PROTEINUR neg 09/12/2013 1012   UROBILINOGEN negative 09/12/2013 1012   NITRITE neg 09/12/2013 1012   LEUKOCYTESUR Negative 09/12/2013 1012    STUDIES: No results found.  ASSESSMENT: 67 y.o. [place], [place] woman status post right upper lobe wedge resection, middle lobe wedge resection, lower lobectomy and mediastinal lymph node dissection 06/06/2005 for a 1.2 cm grade 3 adenocarcinoma, pT1 pN1, stage Ia  (a) followed at [facility]  with every other year chest CT, most recently 06/06/2017  LEFT BREAST CANCER: (1) status post left breast upper outer quadrant biopsy 09/02/2009 for an invasive lobular carcinoma (E-cadherin negative) estrogen receptor 98% positive, progesterone receptor 6% positive, with an MIB-1 of 14% and no HER-2 amplification. [[id number]]  (2) status post left lumpectomy and sentinel lymph node sampling 10/12/2009 for a pT1b pN1, stage IIA invasive lobular breast cancer, with negative margins.  (3) Oncotype DX score of 16 predicts a risk of outside the breast recurrence of 10% if the patient's only systemic treatment is tamoxifen for 5 years. It also predicts no benefit from adjuvant chemotherapy  (4) completed adjuvant radiation therapy 01/28/2010, receiving 5040 cGy to the left breast, with a boost to the upper inner aspect of the breast (to a cumulative dose of 6300 cGy); the axillary and supraclavicular regions received 4500 cGy  (5) started anastrozole July 2011, completedI 5 years July 2016  RIGHT BREAST CANCER: (6) status post right breast upper outer quadrant biopsy 01/29/2018 for a clinical  T1c N0, stage IA invasive ductal carcinoma, grade 2, estrogen receptor positive, progesterone receptor negative, HER-2 amplified, with an MIB-1 of 15%  (7) status post bilateral mastectomy with right sentinel lymph node sampling 03/15/2018, showing  (a) on the left, no malignancy noted  (b) on the right, a pT1c pN0,stage IA invasive ductal carcinoma, grade 2, with negative margins.  (c) a total of 6 lymph nodes removed from the right axilla, none from the left  (8) chemotherapy consisting of paclitaxel weekly x12 started 04/26/2018, with trastuzumab to be given for 1 year  (a) myocardial perfusion study 12/20/2017 showed an ejection fraction of 75% (hyperdynamic  (b) paclitaxel switched to Abraxane with the dose #2 because of initial reaction to Taxol  (9) adjuvant radiation not indicated  (10)  antiestrogens to follow at the completion of local treatment  (11) bone density 02/21/2012 at [facility] showed osteoporosis with a T score of -2.5; on alendronate  (12) repeat genetics testing 02/08/2018 offered through [facility] Common Hereditary Cancers Panel and 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 There were no deleterious mutations in: APC, ATM, AXIN2, BARD1, BMPR1A, BRCA1, BRCA2, BRIP1, CDH1, CDKN2A (p14ARF), CDKN2A (p16INK4a), CKD4, CHEK2, CTNNA1, DICER1, EPCAM (Deletion/duplication testing only), GREM1 (promoter region deletion/duplication testing only), KIT, MEN1, MLH1, MSH2, MSH3, MSH6, MUTYH, NBN, NF1, NHTL1, PALB2, PDGFRA, PMS2, POLD1, POLE, PTEN, RAD50, RAD51C, RAD51D, SDHB, SDHC, SDHD, SMAD4, SMARCA4. STK11, TSC1, TSC2, and VHL.  The following genes were evaluated for sequence changes only: SDHA and HOXB13 c.251G>A variant only.  (a) see "cancer surveillance" below for details of long term Maylon Peppers related screening studies   PLAN: Lidwina is doing well today. Her labs are stable.  Her ANC is 1 today.  We reviewed this in detail.  She can get her treatment today with Abraxane.  I reviewed however, that she may benefit from receiving Neupogen or Granix on the Tuesdays before her treatment to boost her WBC.  After reviewing risks/beneifts of the Granix with her, she would like to forego the injection next week and see what her [place] does, as she notes it has been lower before and has come up on its own.  She understands this means she may have to miss her treatment next week if her [place] is lower than it is today.  She is tolerating the treatment well otherwise and has no peripheral neuropathy at this point which is good.  Dahlia and I reviewed her rash.  We will continue to await the skin biopsy results and will hold Trastuzumab until we f/u with Dr. Denna Haggard.  Arly will return in one week for labs, f/u and her next treatment.  She knows to call for any  questions or concerns prior to her next treatment.    A total of (20) minutes of face-to-face time was spent with this patient with greater than 50% of that time in counseling and care-coordination.  Wilber Bihari, NP 07/06/18 11:36 AM Medical Oncology and Hematology [facility] [address] [place], [place] [address] Tel. [phone]    Fax. [phone]

## 2018-07-06 NOTE — Telephone Encounter (Signed)
Per patient request to r/ her appointment for 1/2 and split. Wrote information in the infusion book for approval. Per 12/27 los

## 2018-07-06 NOTE — Patient Instructions (Signed)
[facility] Discharge Instructions for Patients Receiving Chemotherapy  Today you received the following chemotherapy agents:  Abraxane.  To help prevent nausea and vomiting after your treatment, we encourage you to take your nausea medication as directed.   If you develop nausea and vomiting that is not controlled by your nausea medication, call the clinic.   BELOW ARE SYMPTOMS THAT SHOULD BE REPORTED IMMEDIATELY:  *FEVER GREATER THAN 100.5 F  *CHILLS WITH OR WITHOUT FEVER  NAUSEA AND VOMITING THAT IS NOT CONTROLLED WITH YOUR NAUSEA MEDICATION  *UNUSUAL SHORTNESS OF BREATH  *UNUSUAL BRUISING OR BLEEDING  TENDERNESS IN MOUTH AND THROAT WITH OR WITHOUT PRESENCE OF ULCERS  *URINARY PROBLEMS  *BOWEL PROBLEMS  UNUSUAL RASH Items with * indicate a potential emergency and should be followed up as soon as possible.  Feel free to call the clinic should you have any questions or concerns. The clinic phone number is [phone].  Please show the CHEMO ALERT CARD at check-in to the Emergency Department and triage nurse.   

## 2018-07-10 ENCOUNTER — Telehealth: Payer: Self-pay | Admitting: Oncology

## 2018-07-10 NOTE — Telephone Encounter (Signed)
Infusion for 1/3 added (infusion log/AR). Lab/fu 1/2 and infusion 1/3. Left message for patient confirming appointments - follow up - AR out.

## 2018-07-12 ENCOUNTER — Inpatient Hospital Stay (HOSPITAL_BASED_OUTPATIENT_CLINIC_OR_DEPARTMENT_OTHER): Payer: Medicare Other | Admitting: Adult Health

## 2018-07-12 ENCOUNTER — Other Ambulatory Visit: Payer: Self-pay | Admitting: Oncology

## 2018-07-12 ENCOUNTER — Encounter: Payer: Self-pay | Admitting: Adult Health

## 2018-07-12 ENCOUNTER — Ambulatory Visit: Payer: Medicare Other

## 2018-07-12 ENCOUNTER — Other Ambulatory Visit: Payer: Medicare Other

## 2018-07-12 ENCOUNTER — Inpatient Hospital Stay: Payer: Medicare Other | Attending: Oncology

## 2018-07-12 ENCOUNTER — Telehealth: Payer: Self-pay | Admitting: Adult Health

## 2018-07-12 ENCOUNTER — Ambulatory Visit: Payer: Medicare Other | Admitting: Adult Health

## 2018-07-12 VITALS — BP 143/79 | HR 74 | Temp 97.4°F | Resp 18 | Ht 66.0 in | Wt 120.3 lb

## 2018-07-12 DIAGNOSIS — Z853 Personal history of malignant neoplasm of breast: Secondary | ICD-10-CM | POA: Diagnosis not present

## 2018-07-12 DIAGNOSIS — Z170 Estrogen receptor positive status [ER+]: Secondary | ICD-10-CM | POA: Diagnosis not present

## 2018-07-12 DIAGNOSIS — C50212 Malignant neoplasm of upper-inner quadrant of left female breast: Secondary | ICD-10-CM | POA: Diagnosis not present

## 2018-07-12 DIAGNOSIS — Z85118 Personal history of other malignant neoplasm of bronchus and lung: Secondary | ICD-10-CM

## 2018-07-12 DIAGNOSIS — R21 Rash and other nonspecific skin eruption: Secondary | ICD-10-CM

## 2018-07-12 DIAGNOSIS — Z87891 Personal history of nicotine dependence: Secondary | ICD-10-CM

## 2018-07-12 DIAGNOSIS — Z79899 Other long term (current) drug therapy: Secondary | ICD-10-CM | POA: Diagnosis not present

## 2018-07-12 DIAGNOSIS — M810 Age-related osteoporosis without current pathological fracture: Secondary | ICD-10-CM | POA: Insufficient documentation

## 2018-07-12 DIAGNOSIS — Z5112 Encounter for antineoplastic immunotherapy: Secondary | ICD-10-CM | POA: Diagnosis not present

## 2018-07-12 DIAGNOSIS — Z452 Encounter for adjustment and management of vascular access device: Secondary | ICD-10-CM | POA: Insufficient documentation

## 2018-07-12 LAB — CBC WITH DIFFERENTIAL/PLATELET
ABS IMMATURE GRANULOCYTES: 0.01 10*3/uL (ref 0.00–0.07)
Basophils Absolute: 0 10*3/uL (ref 0.0–0.1)
Basophils Relative: 1 %
Eosinophils Absolute: 0 10*3/uL (ref 0.0–0.5)
Eosinophils Relative: 2 %
HCT: 33 % — ABNORMAL LOW (ref 36.0–46.0)
Hemoglobin: 10.7 g/dL — ABNORMAL LOW (ref 12.0–15.0)
Immature Granulocytes: 1 %
LYMPHS ABS: 0.8 10*3/uL (ref 0.7–4.0)
Lymphocytes Relative: 39 %
MCH: 31.4 pg (ref 26.0–34.0)
MCHC: 32.4 g/dL (ref 30.0–36.0)
MCV: 96.8 fL (ref 80.0–100.0)
Monocytes Absolute: 0.3 10*3/uL (ref 0.1–1.0)
Monocytes Relative: 12 %
Neutro Abs: 1 10*3/uL — ABNORMAL LOW (ref 1.7–7.7)
Neutrophils Relative %: 45 %
Platelets: 275 10*3/uL (ref 150–400)
RBC: 3.41 MIL/uL — ABNORMAL LOW (ref 3.87–5.11)
RDW: 13.9 % (ref 11.5–15.5)
WBC: 2.1 10*3/uL — ABNORMAL LOW (ref 4.0–10.5)
nRBC: 0 % (ref 0.0–0.2)

## 2018-07-12 LAB — COMPREHENSIVE METABOLIC PANEL
ALT: 16 U/L (ref 0–44)
AST: 23 U/L (ref 15–41)
Albumin: 4.1 g/dL (ref 3.5–5.0)
Alkaline Phosphatase: 84 U/L (ref 38–126)
Anion gap: 8 (ref 5–15)
BUN: 16 mg/dL (ref 8–23)
CO2: 28 mmol/L (ref 22–32)
CREATININE: 0.76 mg/dL (ref 0.44–1.00)
Calcium: 9.8 mg/dL (ref 8.9–10.3)
Chloride: 103 mmol/L (ref 98–111)
GFR calc Af Amer: >60 mL/min (ref >60)
GFR calc non Af Amer: >60 mL/min (ref >60)
Glucose, Bld: 94 mg/dL (ref 70–99)
Potassium: 4.5 mmol/L (ref 3.5–5.1)
Sodium: 139 mmol/L (ref 135–145)
Total Bilirubin: 0.3 mg/dL (ref 0.3–1.2)
Total Protein: 7.3 g/dL (ref 6.5–8.1)

## 2018-07-12 NOTE — Progress Notes (Addendum)
[place]  [phone] [phone]    ID: Anna Valentine DOB: October 15, 1950  MR#: [id number]  NLG#:[id number]  Patient Care Team: [name], MD as PCP - General (Internal Medicine) Magrinat, Virgie Dad, MD as Consulting Physician (Oncology) Collene Gobble, MD as Consulting Physician (Pulmonary Disease) [name], MD as Referring Physician Armbruster, Carlota Raspberry, MD as Consulting Physician (Gastroenterology) Kem Boroughs, [place] (Family Medicine) [name], MD as Consulting Physician (Radiation Oncology) Rolm Bookbinder, MD as Consulting Physician (General Surgery)  CHIEF COMPLAINT:  (1) Estrogen receptor positive left-sided breast cancer    (2) history of stage IA right lung adenocarcinoma resected November 2006    (3) triple- positive right-sided breast cancer   CURRENT TREATMENT: Adjuvant chemo/immunotherapy  INTERVAL HISTORY: Anna Valentine returns today for follow-up of her triple positive breast cancer.   She continues on adjuvant chemotherapy/immunotherapy. She is receiving abraxane and tomorrow is the tenth of 12 planned cycles. We are currently holding the Trastuzumab due to rash.  She has seen dermatologist, Dr. Denna Haggard for evaluation of this rash and underwent biopsy.  Anna Valentine tells me that the biopsy demonstrated dermatitis, possibly related to a drug reaction.  She does note that she had additional areas on her abdomen and mid back and thinks it is potentially from the Abraxane and not the Trastuzumab, since it has continued past the Trastuzumab cessation.  REVIEW OF SYSTEMS: Anna Valentine is doing well today.  She denies any issues today.  She wants to know if she can take her pneumonia shot on Monday when she sees Dr. Lamonte Sakai for her annual pulmonology f/u.  Otherwise she is without headaches, vision changes, chest pain, palpitations, cough, shortness of breath, mucositis, dysphagia, appetite change, peripheral neuropathy, bowel/bladder  changes, nausea or vomiting.  A detailed ROS was otherwise non contributory.    LUNG CANCER HISTORY:  From Dr. [name] original intake note 07/26/2005: (Lung cancer presentation)  "The patient is a very pleasant 68 year old female without a significant past medical history, who states that in 04/2005 she felt a lump in the left neck.  It did not resolve spontaneously, so she was seen by Dr. [name] [facility], and a CT scan of the neck was performed on 04/25/05.  There was noted to be two lymph nodes on the left, and they were normal in size, but they were asymmetrical.  No other pathologic findings were noted, except for left internal jugular vein, which was atypically positioned.  However, because of these enlarged lymph nodes, a CT scan of the chest was also obtained by Dr. Renold Genta on 04/27/05, and the CT scan did reveal a poorly defined nodular opacity medially in the right upper lobe, measuring 6.5 mg anterior to posterior.  Within the right lower lobe there was noted to be a lobular nodular mass measuring 13 x 13 mm, and was felt to be worrisome for a primary lung carcinoma.  No other nodules or effusions were seen on the left lung.  On the mediastinal window images there was slight prominence of right hilar nodes, but no other evidence of mediastinal or hilar adenopathy.  A PET scan was thereafter obtained on 05/06/05.  The PET scan did reveal increased FDG activity within a small mass in the posterior superior right lower lobe, highly suspicious for a malignancy.  No significant nodal FDG uptake was identified within the hilar regions or the mediastinum.  Also noted was abnormal FDG uptake in the right middle lobe, corresponding to the CT scan findings,  and again suspicious for a malignancy.  There was no evidence of metastatic disease within the neck, abdomen or pelvis.  The patient was then referred to [facility], and was seen by Dr. [name].  The patient underwent a right lower lobe wedge resection  with completion lobectomy and mediastinal lymph node biopsies.  The pathology revealed the following:  Right upper lobe wedge resection revealed pulmonary tissue with necrotizing granulomatous inflammation.  No evidence of malignancy.  Right middle lobe wedge revealed again pulmonary tissue with necrotizing granulomatous inflammation.  No evidence of malignancy.  The right pleural lobe (lobectomy) revealed an adenocarcinoma, poorly differentiated, grade 3, measuring 1.2 cm.  The visceral pleura was negative.  Chest wall negative.  Mediastinum negative.  All margins were negative, including the pleural and parenchymal margins.  There was no evidence of lymphovascular invasion.  In all lymph nodes sampled, including level 11, level 7, level 12, 4R and 2R lymph nodes negative for malignancy.  The patient was staged as T1 N0 M0, stage IA adenocarcinoma of the right lung.  Postoperatively the patient's course was complicated by the development of pleural effusion, requiring diuresis.  She had multiple x-rays performed.  Last x-ray performed on 07/07/05 revealed persistent right pleural effusion.  It was recommended that the patient have followups at [facility].  Dr. [name] Baxley kindly refers the patient to me today for medical oncology evaluation.  Clinically, the patient states that she has recovered well.  She is once again walking.  She was a runner, and she is trying to get back in shape."  LEFT BREAST CANCER HISTORY:From [name]'s 09/30/2009 note:  "She tells me that most recently she had her yearly screening mammogram performed that revealed an abnormality.  She also on exam was noted to have a palpable lump in the upper left breast as well.  Because of the abnormality, patient on September 02, 2009 had a digital diagnostic mammogram of the left breast and ultrasound of the left breast.  The mammogram revealed a 7.0 mm area slightly increased density in the upper left breast.  There were no  suspicious calcifications noted.  The breast was heterogeneously dense.  Patient then went onto have an ultrasound of the breast performed and again a 7.0 mm irregular hypoechoic shadowing mass was noted in the 12 o'clock position of the left breast 5.0 cm from the nipple.  There were no enlarged or abnormal left axillary lymph nodes identified.  Patient went onto have a core biopsy performed on 09/02/2009 [phone]).  The needle core biopsy of the 12 o'clock mass revealed an invasive mammary carcinoma, low to intermediate grade.  It was lobular carcinoma.  Confirmatory immunohistochemical stains revealed the tumor to be strongly positive for cytokeratin AE1-AE3 with an infiltrative pattern of growth and the tumor was negative for E-cadherin stain confirming a lobular phenotype.  The tumor was estrogen receptor positive at 98%, progesterone receptor positive 6%, proliferation marker Ki-67 by MIB was 14%.  The tumor did not express HER-2/neu by CISH with a signal of 1.04.  Patient was seen by Dr. Neldon Mc on 09/08/2009 for discussion of surgical options.  His recommendation was a lumpectomy.  Patient also had bilateral MRI of the breasts performed, which revealed a 0.8 cm mildly enlarged area of mass-like enhancement at the 12 o'clock location corresponding to the biopsy-proven breast cancer.  There was no evidence of lymphadenopathy."   PAST MEDICAL HISTORY: Past Medical History:  Diagnosis Date  . Anemia   .  BRCA negative 2011   BRCA I/ II negative  . Breast cancer ([place]) 08/2009   stage 2, rx with lumpectomy and xrt  . COPD (chronic obstructive pulmonary disease) (H[facility])    pt.unsure of diagnosis status  . Emphysema of lung ([place])    pt. questions diagnosis  . Family history of adverse reaction to anesthesia    My Sister has nausea  . Family history of breast cancer   . Family history of colon cancer   . Family history of lung cancer   . GERD (gastroesophageal reflux disease)    not  presently having symptoms  . Lung cancer ([place]) 06/06/05   stage 1 poorly differentiated adenocarcinoma, s/p right lower lobectomy.  . Osteoporosis   . Personal history of lung cancer   . STD (sexually transmitted disease)    HSV    PAST SURGICAL HISTORY: Past Surgical History:  Procedure Laterality Date  . BREAST BIOPSY    . BREAST LUMPECTOMY  10/12/2009   Left lumpectomy and radiation, stage II, ER/PR+, Her 2 nu negative  . BUNIONECTOMY    . COLONOSCOPY    . LOBECTOMY  06/06/2005   RLL  . MASTECTOMY W/ SENTINEL NODE BIOPSY Bilateral 03/15/2018  . MASTECTOMY W/ SENTINEL NODE BIOPSY Bilateral 03/15/2018   Procedure: BILATERAL TOTAL MASTECTOMIES WITH RIGHT SENTINEL LYMPH NODE BIOPSY;  Surgeon: Rolm Bookbinder, MD;  Location: [place];  Service: General;  Laterality: Bilateral;  . PORTACATH PLACEMENT N/A 03/15/2018   Procedure: INSERTION PORT-A-CATH WITH [place];  Surgeon: Rolm Bookbinder, MD;  Location: [place];  Service: General;  Laterality: N/A;  . TONSILLECTOMY    . TUBAL LIGATION  1984    FAMILY HISTORY Family History  Problem Relation Age of Onset  . Allergies Mother   . Asthma Mother   . Lung cancer Mother   . Breast cancer Mother 40       recurrence age 22  . Colon cancer Father 23  . Prostate cancer Brother 24  . Breast cancer Sister 67       Recurrence age 41 BRCA negative  . Colon polyps Sister   . Breast cancer Sister 11  . Prostate cancer Brother 76  . Breast cancer Maternal Grandmother 3  . Colon cancer Maternal Aunt   . Leukemia Maternal Grandfather   . Lung cancer Maternal Aunt   . Breast cancer Cousin   . Esophageal cancer Neg Hx   . Rectal cancer Neg Hx   . Stomach cancer Neg Hx   The patient's maternal grandmother was diagnosed with cancer at age 74. Patient's mother was diagnosed at age 79. The patient's sister was diagnosed at age 70 with recurrence at 67 and is BRCA negative. A second sister was diagnosed at age 27.  GYNECOLOGIC HISTORY:  No LMP  recorded. Patient is postmenopausal. Menarche age 14, the patient is GX P0. She went through the change of life in approximately age 59. She did not take hormone replacement.  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 It's just the 2 of them at home. She takes care of 9-year-old for a neighbor and is the primary caregiver for her husband who has had a couple of small strokes and has myelodysplasia. She helps a friend out with her business. She walks her 86 year old lab. 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 Smoking status: Former Smoker    Packs/day: 2.00    Years: 18.00    Pack years: 36.00    Last attempt to quit: 07/12/1987    Years since quitting: 31.0  . Smokeless tobacco: Never Used  Substance Use Topics  . Alcohol use: Yes    Alcohol/week: 4.0 standard drinks    Types: 4 Standard drinks or equivalent per week    Comment: social  . Drug use: No    Allergies  Allergen Reactions  . Clarithromycin Rash    Current Outpatient Medications  Medication Sig Dispense Refill  . acyclovir (ZOVIRAX) 400 MG tablet TAKE 1 TABLET BY MOUTH TWICE A DAY 90 tablet 3  . calcium gluconate 500 MG tablet Take 500 mg by mouth daily.    . cholecalciferol (VITAMIN D) 1000 UNITS tablet Take 1,000 Units by mouth daily.    . Psyllium (METAMUCIL PO) Take 5 mLs by mouth daily.     Marland Kitchen triamcinolone (KENALOG) 0.025 % cream Apply 1 application topically 4 (four) times daily. 80 g 2  . vitamin C (ASCORBIC ACID) 500 MG tablet Take 500 mg by mouth daily.     No current facility-administered medications for this visit.     OBJECTIVE:   Vitals:   07/12/18 1134  BP: (!) 143/79  Pulse: 74  Resp: 18  Temp: (!) 97.4 F (36.3 C)  SpO2: 100%     Body mass index is 19.42 kg/m.    ECOG FS:1 - Symptomatic but  completely ambulatory  GENERAL: Patient is a well appearing female in no acute distress HEENT:  Sclerae anicteric.  Oropharynx clear and moist. No ulcerations or evidence of oropharyngeal candidiasis. Neck is supple.  NODES:  No cervical, supraclavicular, or axillary lymphadenopathy palpated.  BREAST EXAM:  S/p bilateral mastectomies, no sign of local recurrence LUNGS:  Clear to auscultation bilaterally.  No wheezes or rhonchi. HEART:  Regular rate and rhythm. No murmur appreciated. ABDOMEN:  Soft, nontender.  Positive, normoactive bowel sounds. No organomegaly palpated. Small fine erythematous maculopapular rash beginning to scab over, the same extending on mid back MSK:  No focal spinal tenderness to palpation. Full range of motion bilaterally in the upper extremities. EXTREMITIES:  No peripheral edema.   SKIN:  Clear with no obvious rashes or skin changes. No nail dyscrasia.  See above. NEURO:  Nonfocal. Well oriented.  Appropriate affect.           CMP     Component Value Date/Time   NA 139 07/12/2018 1056   NA 139 12/22/2016 1141   K 4.5 07/12/2018 1056   K 4.2 12/22/2016 1141   CL 103 07/12/2018 1056   CL 103 11/22/2012 1025   CO2 28 07/12/2018 1056   CO2 28 12/22/2016 1141   GLUCOSE 94 07/12/2018 1056   GLUCOSE 92 12/22/2016 1141   GLUCOSE 94 11/22/2012 1025   BUN 16 07/12/2018 1056   BUN 13.2 12/22/2016 1141   CREATININE 0.76 07/12/2018 1056   CREATININE 0.64 11/07/2017 0906   CREATININE 0.7 12/22/2016 1141   CALCIUM 9.8 07/12/2018 1056   CALCIUM 9.7 12/22/2016 1141   PROT 7.3 07/12/2018 1056   PROT 7.0 12/22/2016 1141   ALBUMIN 4.1 07/12/2018 1056   ALBUMIN 3.8 12/22/2016 1141   AST 23 07/12/2018 1056   AST 18 12/22/2016 1141   ALT 16 07/12/2018 1056   ALT 10 12/22/2016 1141   ALKPHOS 84 07/12/2018 1056   ALKPHOS 81 12/22/2016 1141  BILITOT 0.3 07/12/2018 1056   BILITOT 0.65 12/22/2016 1141   GFRNONAA >60 07/12/2018 1056   GFRNONAA 93 11/07/2017 0906     GFRAA >60 07/12/2018 1056   GFRAA 108 11/07/2017 0906    INo results found for: SPEP, UPEP  Lab Results  Component Value Date   WBC 2.1 (L) 07/12/2018   NEUTROABS 1.0 (L) 07/12/2018   HGB 10.7 (L) 07/12/2018   HCT 33.0 (L) 07/12/2018   MCV 96.8 07/12/2018   PLT 275 07/12/2018      Chemistry      Component Value Date/Time   NA 139 07/12/2018 1056   NA 139 12/22/2016 1141   K 4.5 07/12/2018 1056   K 4.2 12/22/2016 1141   CL 103 07/12/2018 1056   CL 103 11/22/2012 1025   CO2 28 07/12/2018 1056   CO2 28 12/22/2016 1141   BUN 16 07/12/2018 1056   BUN 13.2 12/22/2016 1141   CREATININE 0.76 07/12/2018 1056   CREATININE 0.64 11/07/2017 0906   CREATININE 0.7 12/22/2016 1141      Component Value Date/Time   CALCIUM 9.8 07/12/2018 1056   CALCIUM 9.7 12/22/2016 1141   ALKPHOS 84 07/12/2018 1056   ALKPHOS 81 12/22/2016 1141   AST 23 07/12/2018 1056   AST 18 12/22/2016 1141   ALT 16 07/12/2018 1056   ALT 10 12/22/2016 1141   BILITOT 0.3 07/12/2018 1056   BILITOT 0.65 12/22/2016 1141       Lab Results  Component Value Date   LABCA2 18 09/30/2009    No components found for: [id number]  No results for input(s): INR in the last 168 hours.  Urinalysis    Component Value Date/Time   BILIRUBINUR neg 09/12/2013 1012   PROTEINUR neg 09/12/2013 1012   UROBILINOGEN negative 09/12/2013 1012   NITRITE neg 09/12/2013 1012   LEUKOCYTESUR Negative 09/12/2013 1012    STUDIES: No results found.  ASSESSMENT: 68 y.o. Anna Valentine, Anna Valentine status post right upper lobe wedge resection, middle lobe wedge resection, lower lobectomy and mediastinal lymph node dissection 06/06/2005 for a 1.2 cm grade 3 adenocarcinoma, pT1 pN1, stage Ia  (a) followed at [facility] with every other year chest CT, most recently 06/06/2017  LEFT BREAST CANCER: (1) status post left breast upper outer quadrant biopsy 09/02/2009 for an invasive lobular carcinoma (E-cadherin negative) estrogen receptor 98% positive,  progesterone receptor 6% positive, with an MIB-1 of 14% and no HER-2 amplification. [[id number]]  (2) status post left lumpectomy and sentinel lymph node sampling 10/12/2009 for a pT1b pN1, stage IIA invasive lobular breast cancer, with negative margins.  (3) Oncotype DX score of 16 predicts a risk of outside the breast recurrence of 10% if the patient's only systemic treatment is tamoxifen for 5 years. It also predicts no benefit from adjuvant chemotherapy  (4) completed adjuvant radiation therapy 01/28/2010, receiving 5040 cGy to the left breast, with a boost to the upper inner aspect of the breast (to a cumulative dose of 6300 cGy); the axillary and supraclavicular regions received 4500 cGy  (5) started anastrozole July 2011, completedI 5 years July 2016  RIGHT BREAST CANCER: (6) status post right breast upper outer quadrant biopsy 01/29/2018 for a clinical  T1c N0, stage IA invasive ductal carcinoma, grade 2, estrogen receptor positive, progesterone receptor negative, HER-2 amplified, with an MIB-1 of 15%  (7) status post bilateral mastectomy with right sentinel lymph node sampling 03/15/2018, showing  (a) on the left, no malignancy noted  (b) on the right, a pT1c  pN0,stage IA invasive ductal carcinoma, grade 2, with negative margins.  (c) a total of 6 lymph nodes removed from the right axilla, none from the left  (8) chemotherapy consisting of paclitaxel weekly x12 started 04/26/2018, with trastuzumab to be given for 1 year  (a) myocardial perfusion study 12/20/2017 showed an ejection fraction of 75% (hyperdynamic  (b) paclitaxel switched to Abraxane with the dose #2 because of initial reaction to Taxol  (9) adjuvant radiation not indicated  (10) antiestrogens to follow at the completion of local treatment  (11) bone density 02/21/2012 at [facility] showed osteoporosis with a T score of -2.5; on alendronate  (12) repeat genetics testing 02/08/2018 offered through [facility] Common  Hereditary Cancers Panel and 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 There were no deleterious mutations in: APC, ATM, AXIN2, BARD1, BMPR1A, BRCA1, BRCA2, BRIP1, CDH1, CDKN2A (p14ARF), CDKN2A (p16INK4a), CKD4, CHEK2, CTNNA1, DICER1, EPCAM (Deletion/duplication testing only), GREM1 (promoter region deletion/duplication testing only), KIT, MEN1, MLH1, MSH2, MSH3, MSH6, MUTYH, NBN, NF1, NHTL1, PALB2, PDGFRA, PMS2, POLD1, POLE, PTEN, RAD50, RAD51C, RAD51D, SDHB, SDHC, SDHD, SMAD4, SMARCA4. STK11, TSC1, TSC2, and VHL.  The following genes were evaluated for sequence changes only: SDHA and HOXB13 c.251G>A variant only.  (a) see "cancer surveillance" below for details of long term Maylon Peppers related screening studies   PLAN: Anna Valentine is doing well today.  She met with myself and Dr. Jana Hakim.  After review of the interval history, she will stop receiving Abraxane and restart Trastuzumab, as the rash seems to be more related to the Abraxane.  She will resume Trastuzumab every 21 days beginning tomorrow.  She is in agreement with this plan.    Dr. Jana Hakim recommended that she wait at least 10 days for her immune system to rebound so her pneumonia vaccine would work better.  [place] will return tomorrow for her treatment and in 3 weeks for labs, f/u with Dr. Jana Hakim, and her next Trastuzumab.  She knows to call for any questions or concerns prior to her next treatment.     Anna Bihari, NP 07/12/18 3:14 PM Medical Oncology and Hematology [facility] [address] [place], [place] [address] Tel. [phone]    Fax. [phone]   ADDENDUM: We have held Anna Valentine's trastuzumab for several weeks, but her rash has persisted.  It persisted right through stopping the carboplatin.  Accordingly we think Doxil as the culprit and we are stopping that medication at this point.   She has received a total of 10 paclitaxel treatments out of 12 planned.  This is very  adequate.  The plan is to continue trastuzumab for a total of 1 year.  She had an echocardiogram most recently 06/19/2018 being a normal ejection fraction.  I am delighted that she can go off the Taxol at this point.  Anticipate her rash will be greatly improved if not resolved by the time she returns to see me 3 weeks from now, with her next trastuzumab dose.  I personally saw this patient and performed a substantive portion of this encounter with the listed APP documented above.   Chauncey Cruel, MD Medical Oncology and Hematology [facility] [address] [place], [place] [address] Tel. [phone]    Fax. [phone]

## 2018-07-12 NOTE — Telephone Encounter (Signed)
Printed calendar and avs. °

## 2018-07-13 ENCOUNTER — Other Ambulatory Visit: Payer: Self-pay | Admitting: Oncology

## 2018-07-13 ENCOUNTER — Inpatient Hospital Stay: Payer: Medicare Other

## 2018-07-13 VITALS — BP 132/74 | HR 72 | Temp 97.9°F | Resp 18

## 2018-07-13 DIAGNOSIS — Z5112 Encounter for antineoplastic immunotherapy: Secondary | ICD-10-CM | POA: Diagnosis not present

## 2018-07-13 DIAGNOSIS — Z170 Estrogen receptor positive status [ER+]: Secondary | ICD-10-CM

## 2018-07-13 DIAGNOSIS — R21 Rash and other nonspecific skin eruption: Secondary | ICD-10-CM | POA: Diagnosis not present

## 2018-07-13 DIAGNOSIS — C50212 Malignant neoplasm of upper-inner quadrant of left female breast: Secondary | ICD-10-CM | POA: Diagnosis not present

## 2018-07-13 DIAGNOSIS — M810 Age-related osteoporosis without current pathological fracture: Secondary | ICD-10-CM | POA: Diagnosis not present

## 2018-07-13 DIAGNOSIS — Z95828 Presence of other vascular implants and grafts: Secondary | ICD-10-CM

## 2018-07-13 DIAGNOSIS — Z79899 Other long term (current) drug therapy: Secondary | ICD-10-CM | POA: Diagnosis not present

## 2018-07-13 MED ORDER — DIPHENHYDRAMINE HCL 25 MG PO CAPS
ORAL_CAPSULE | ORAL | Status: AC
  Filled 2018-07-13: qty 2

## 2018-07-13 MED ORDER — ALTEPLASE 2 MG IJ SOLR
2.0000 mg | Freq: Once | INTRAMUSCULAR | Status: AC
  Administered 2018-07-13: 2 mg
  Filled 2018-07-13: qty 2

## 2018-07-13 MED ORDER — ALTEPLASE 2 MG IJ SOLR
INTRAMUSCULAR | Status: AC
  Filled 2018-07-13: qty 2

## 2018-07-13 MED ORDER — HEPARIN SOD (PORK) LOCK FLUSH 100 UNIT/ML IV SOLN
500.0000 [IU] | Freq: Once | INTRAVENOUS | Status: AC | PRN
  Administered 2018-07-13: 500 [IU]
  Filled 2018-07-13: qty 5

## 2018-07-13 MED ORDER — SODIUM CHLORIDE 0.9 % IV SOLN
Freq: Once | INTRAVENOUS | Status: AC
  Administered 2018-07-13: 09:00:00 via INTRAVENOUS
  Filled 2018-07-13: qty 250

## 2018-07-13 MED ORDER — TRASTUZUMAB CHEMO 150 MG IV SOLR
450.0000 mg | Freq: Once | INTRAVENOUS | Status: AC
  Administered 2018-07-13: 450 mg via INTRAVENOUS
  Filled 2018-07-13: qty 21.43

## 2018-07-13 MED ORDER — ACETAMINOPHEN 325 MG PO TABS
650.0000 mg | ORAL_TABLET | Freq: Once | ORAL | Status: AC
  Administered 2018-07-13: 650 mg via ORAL

## 2018-07-13 MED ORDER — ACETAMINOPHEN 325 MG PO TABS
ORAL_TABLET | ORAL | Status: AC
  Filled 2018-07-13: qty 2

## 2018-07-13 MED ORDER — SODIUM CHLORIDE 0.9% FLUSH
10.0000 mL | INTRAVENOUS | Status: DC | PRN
  Administered 2018-07-13: 10 mL
  Filled 2018-07-13: qty 10

## 2018-07-13 MED ORDER — DIPHENHYDRAMINE HCL 25 MG PO CAPS
25.0000 mg | ORAL_CAPSULE | Freq: Once | ORAL | Status: AC
  Administered 2018-07-13: 25 mg via ORAL

## 2018-07-13 NOTE — Progress Notes (Signed)
Confirmed w/ Dr. Jana Hakim, we will reload Anna Valentine today w/ Herceptin but it may be infused over 30 min since she has tolerated Herceptin infusions in the past. Kennith Center, Pharm.D., CPP 07/13/2018@9 :06 AM

## 2018-07-16 ENCOUNTER — Telehealth: Payer: Self-pay | Admitting: *Deleted

## 2018-07-16 ENCOUNTER — Ambulatory Visit (INDEPENDENT_AMBULATORY_CARE_PROVIDER_SITE_OTHER): Payer: Medicare Other | Admitting: Emergency Medicine

## 2018-07-16 ENCOUNTER — Encounter: Payer: Self-pay | Admitting: Emergency Medicine

## 2018-07-16 DIAGNOSIS — J479 Bronchiectasis, uncomplicated: Secondary | ICD-10-CM

## 2018-07-16 NOTE — Patient Instructions (Addendum)
We reviewed your CT scan of the chest today.  If you develop increased cough, increased sputum production, fever, symptoms of bronchitis, then we will decide whether to perform bronchoscopy to obtain culture information.  You need to get the Prevnar-13 immunization in mid-to-late February.  Follow with Dr Lamonte Sakai in 6 months or sooner if you have any problems

## 2018-07-16 NOTE — Telephone Encounter (Signed)
This RN spoke with pt per her call stating concern due to " small knot near port ". Port is on her right side.  [place] states she noted a hard small knot to the side of her port " where like the line goes in to the port ".  Knot is smaller then a pea, hard but not painful.  She does note as well some " itchy bumps but that is likely because the nurse forgot to use the sensitive dressing "  Nesreen states " they also had a problem getting blood from my port and had to use that stuff to get blood "  Aaleigha denies any swelling at port, in neck or arm.  She denies any SOB.  No other issues.  Above discussed- with request for pt to monitor - may be related to needle and when removed may have caused a small hematoma.  If worsens pt will call, she will use benadryl and neosporin to area irritated by the tape. - other wise she will monitor and call with update later this week.

## 2018-07-16 NOTE — Progress Notes (Signed)
 Subjective:    Patient ID: Anna Valentine, female    DOB: 07/01/1951, 68 y.o.   MRN: [id number]  HPI 68-year-old former smoker with a history of non-small cell lung cancer status post lobectomy and right upper and middle lobe wedge resections. She also has a history of breast cancer post lumpectomy. 's been followed with serial CT scans of the chest at [facility] read on a scan 01/26/16 she was found to have a right upper lobe posterior soft tissue component with associated bronchiectasis and airway distortion, an inferior ground glass area. This prompted PET scans 02/04/16 and 8/28/17that I have reviewed showed decrease in size of an ill-defined hypermetabolic groundglass opacity and centrilobular nodularity in the lateral right upper lobe. There was a stable irregular soft tissue lesion in the RUL that was not hypermetabolic. Only a repeat CT scan of the chest on 06/08/16 shows continued bronchiectasis, increase in mucous plugging and right lower lobe consolidation. Stable left apical GGI as well. She has felt well. She does occasionally cough, can bring up green phlegm. Does not cough every day. She has experienced a single episode of hemoptysis in 11/2015.     ROV 07/16/18 --Anna Valentine is 68.  She follows up today for history of bronchiectasis and suspected chronic mycobacterial colonization (culture negative).  She has a history of non-small cell lung cancer treated by RUL amd RML wedges, RLL lobectomy.  Unfortunately since last visit she is also been diagnosed with breast cancer and is under treatment with Dr Magrinat.  We have been following apical and peripheral pleural-based opacities by CT scan of the chest.  Her most recent CT was done at [facility] 06/05/2018.  She had extensive bronchiectasis with some pleural thickening, mucous plugging similar to prior films.  She has scattered pulmonary nodules including some new nodules that were all 4 mm or less.  There is a left lower lobe nodule that  developing some central cavitation cystic change 1 cm.  Occasionally coughs up mucous, usually pale. No daily sputum burden.    Review of Systems  Constitutional: Negative.  Negative for fever and unexpected weight change.  HENT: Negative.  Negative for congestion, dental problem, ear pain, nosebleeds, postnasal drip, rhinorrhea, sinus pressure, sneezing, sore throat and trouble swallowing.   Eyes: Negative.  Negative for redness and itching.  Respiratory: Negative.  Negative for cough, chest tightness, shortness of breath and wheezing.   Cardiovascular: Negative.  Negative for palpitations and leg swelling.  Gastrointestinal: Negative.  Negative for nausea and vomiting.  Genitourinary: Negative.  Negative for dysuria.  Musculoskeletal: Negative.  Negative for joint swelling.  Skin: Negative.  Negative for rash.  Neurological: Negative.  Negative for headaches.  Hematological: Negative.  Does not bruise/bleed easily.  Psychiatric/Behavioral: Negative.  Negative for dysphoric mood. The patient is not nervous/anxious.     Past Medical History:  Diagnosis Date  . Anemia   . BRCA negative 2011   BRCA I/ II negative  . Breast cancer (HCC) 08/2009   stage 2, rx with lumpectomy and xrt  . COPD (chronic obstructive pulmonary disease) (HCC)    pt.unsure of diagnosis status  . Emphysema of lung (HCC)    pt. questions diagnosis  . Family history of adverse reaction to anesthesia    My Sister has nausea  . Family history of breast cancer   . Family history of colon cancer   . Family history of lung cancer   . GERD (gastroesophageal reflux disease)    not   presently having symptoms  . Lung cancer ([place]) 06/06/05   stage 1 poorly differentiated adenocarcinoma, s/p right lower lobectomy.  . Osteoporosis   . Personal history of lung cancer   . STD (sexually transmitted disease)    HSV     Family History  Problem Relation Age of Onset  . Allergies Mother   . Asthma Mother   . Lung cancer  Mother   . Breast cancer Mother 11       recurrence age 7  . Colon cancer Father 84  . Prostate cancer Brother 95  . Breast cancer Sister 26       Recurrence age 39 BRCA negative  . Colon polyps Sister   . Breast cancer Sister 51  . Prostate cancer Brother 62  . Breast cancer Maternal Grandmother 49  . Colon cancer Maternal Aunt   . Leukemia Maternal Grandfather   . Lung cancer Maternal Aunt   . Breast cancer Cousin   . Esophageal cancer Neg Hx   . Rectal cancer Neg Hx   . Stomach cancer Neg Hx      Social History   Socioeconomic History  . Marital status: Married    Spouse name: Not on file  . Number of children: 0  . Years of education: Not on file  . Highest education level: Not on file  Occupational History  . Occupation: OFFICE MANAGER    Employer: [employer]  Social Needs  . Financial resource strain: Not on file  . Food insecurity:    Worry: Not on file    Inability: Not on file  . Transportation needs:    Medical: Not on file    Non-medical: Not on file  Tobacco Use  . Smoking status: Former Smoker    Packs/day: 2.00    Years: 18.00    Pack years: 36.00    Last attempt to quit: 07/12/1987    Years since quitting: 31.0  . Smokeless tobacco: Never Used  Substance and Sexual Activity  . Alcohol use: Yes    Alcohol/week: 4.0 standard drinks    Types: 4 Standard drinks or equivalent per week    Comment: social  . Drug use: No  . Sexual activity: Yes  Lifestyle  . Physical activity:    Days per week: Not on file    Minutes per session: Not on file  . Stress: Not on file  Relationships  . Social connections:    Talks on phone: Not on file    Gets together: Not on file    Attends religious service: Not on file    Active member of club or organization: Not on file    Attends meetings of clubs or organizations: Not on file    Relationship status: Not on file  . Intimate partner violence:    Fear of current or ex partner: Not on file    Emotionally  abused: Not on file    Physically abused: Not on file    Forced sexual activity: Not on file  Other Topics Concern  . Not on file  Social History Narrative  . Not on file     Allergies  Allergen Reactions  . Taxol [Paclitaxel] Anaphylaxis  . Clarithromycin Rash     Outpatient Medications Prior to Visit  Medication Sig Dispense Refill  . acyclovir (ZOVIRAX) 400 MG tablet TAKE 1 TABLET BY MOUTH TWICE A DAY 90 tablet 3  . calcium gluconate 500 MG tablet Take 500 mg by mouth daily.    Marland Kitchen  cholecalciferol (VITAMIN D) 1000 UNITS tablet Take 1,000 Units by mouth daily.    . Psyllium (METAMUCIL PO) Take 5 mLs by mouth daily.     Marland Kitchen triamcinolone (KENALOG) 0.025 % cream Apply 1 application topically 4 (four) times daily. 80 g 2  . vitamin C (ASCORBIC ACID) 500 MG tablet Take 500 mg by mouth daily.     No facility-administered medications prior to visit.         Objective:   Physical Exam Vitals:   07/16/18 1644  BP: 116/70  Pulse: 62  SpO2: 97%  Weight: 124 lb (56.2 kg)  Height: 5' 6" (1.676 m)   Gen: Pleasant, thin woman, well-nourished, in no distress,  normal affect  ENT: No lesions,  mouth clear,  oropharynx clear, no postnasal drip  Neck: No JVD, no stridor  Lungs: No use of accessory muscles, clear without rales or rhonchi  Cardiovascular: RRR, heart sounds normal, no murmur or gallops, no peripheral edema  Musculoskeletal: No deformities, no cyanosis or clubbing  Neuro: alert, non focal  Skin: Warm, no lesions or rash     Assessment & Plan:  Bronchiectasis without acute exacerbation (HCC) History of bronchiectasis, necrotizing granulomatous inflammation noted on previous biopsy (at the time of her lobectomy).  She has never grown out mycobacterial organism.  CT scan from [facility] done November 2019 reviewed today.  She is currently on chemotherapy for breast cancer, no evidence of opportunistic infection.  Her bronchiectasis is actually minimally symptomatic with  scant cough.  If this changes, she has increased mucus burden, increased cough, bronchitic symptoms then I think she needs bronchoscopy for culture data.  She had the Pneumovax last year, needs a Prevnar 13 this year.  We will do this once she is off her current chemotherapy, counts have normalized.  Baltazar Apo, MD, PhD 07/16/2018, 5:41 PM [facility] [phone] or if no answer [phone]

## 2018-07-16 NOTE — Assessment & Plan Note (Signed)
History of bronchiectasis, necrotizing granulomatous inflammation noted on previous biopsy (at the time of her lobectomy).  She has never grown out mycobacterial organism.  CT scan from [facility] done November 2019 reviewed today.  She is currently on chemotherapy for breast cancer, no evidence of opportunistic infection.  Her bronchiectasis is actually minimally symptomatic with scant cough.  If this changes, she has increased mucus burden, increased cough, bronchitic symptoms then I think she needs bronchoscopy for culture data.  She had the Pneumovax last year, needs a Prevnar 13 this year.  We will do this once she is off her current chemotherapy, counts have normalized.

## 2018-07-18 DIAGNOSIS — H33101 Unspecified retinoschisis, right eye: Secondary | ICD-10-CM | POA: Diagnosis not present

## 2018-07-18 DIAGNOSIS — H2511 Age-related nuclear cataract, right eye: Secondary | ICD-10-CM | POA: Diagnosis not present

## 2018-07-18 DIAGNOSIS — H35371 Puckering of macula, right eye: Secondary | ICD-10-CM | POA: Diagnosis not present

## 2018-07-18 DIAGNOSIS — H2512 Age-related nuclear cataract, left eye: Secondary | ICD-10-CM | POA: Diagnosis not present

## 2018-07-19 ENCOUNTER — Other Ambulatory Visit: Payer: Medicare Other

## 2018-07-19 ENCOUNTER — Ambulatory Visit: Payer: Medicare Other

## 2018-07-19 ENCOUNTER — Ambulatory Visit: Payer: Medicare Other | Admitting: Oncology

## 2018-07-25 NOTE — Progress Notes (Signed)
[place]  [phone] [phone]    ID: Anna Valentine DOB: Feb 22, 1951  MR#: [id number]  SHU#:[id number]  Patient Care Team: [name], MD as PCP - General (Internal Medicine) Dvid Pendry, Virgie Dad, MD as Consulting Physician (Oncology) Collene Gobble, MD as Consulting Physician (Pulmonary Disease) [name], MD as Referring Physician Armbruster, Carlota Raspberry, MD as Consulting Physician (Gastroenterology) Kem Boroughs, [place] (Family Medicine) [name], MD as Consulting Physician (Radiation Oncology) Rolm Bookbinder, MD as Consulting Physician (General Surgery)  CHIEF COMPLAINT:  (1) Estrogen receptor positive left-sided breast cancer (2011)    (2) history of stage IA right lung adenocarcinoma resected November 2006    (3) triple- positive right-sided breast cancer    (4) p53 mutation   CURRENT TREATMENT: Trastuzumab, tamoxifen  INTERVAL HISTORY: Anna Valentine returns today for follow-up and treatment of her triple positive breast cancer.   She continues on trastuzumab. Her last dose was received on 07/06/2018. She tolerates this well and without any noticeable side effects.    She has had no rash or skin issues since the discontinuance of Abraxane.   Anna Valentine's last echocardiogram on 06/19/2018, showed an ejection fraction in the 55% - 60% range.  Her next echocardiogram is already scheduled for 09/19/2018  Since her last visit here, she has not undergone any additional studies.     REVIEW OF SYSTEMS: Anna Valentine walks her dogs for exercise, but she does not have a formal exercise routine; due to the cold weather, she has not been as active with her dog outside as she would be on warmer days. Her hair thinned, but she did not completely lose it, so she did not have to wear a wig. The patient denies unusual headaches, visual changes, nausea, vomiting, or dizziness. There has been no unusual cough, phlegm production, or pleurisy. This  been no change in bowel or bladder habits. The patient denies unexplained fatigue or unexplained weight loss, bleeding, rash, or fever. A detailed review of systems was otherwise noncontributory.    LUNG CANCER HISTORY:  From Dr. [name] original intake note 07/26/2005: (Lung cancer presentation)  "The patient is a very pleasant 68 year old female without a significant past medical history, who states that in 04/2005 she felt a lump in the left neck.  It did not resolve spontaneously, so she was seen by Dr. [name] [facility], and a CT scan of the neck was performed on 04/25/05.  There was noted to be two lymph nodes on the left, and they were normal in size, but they were asymmetrical.  No other pathologic findings were noted, except for left internal jugular vein, which was atypically positioned.  However, because of these enlarged lymph nodes, a CT scan of the chest was also obtained by Dr. Renold Genta on 04/27/05, and the CT scan did reveal a poorly defined nodular opacity medially in the right upper lobe, measuring 6.5 mg anterior to posterior.  Within the right lower lobe there was noted to be a lobular nodular mass measuring 13 x 13 mm, and was felt to be worrisome for a primary lung carcinoma.  No other nodules or effusions were seen on the left lung.  On the mediastinal window images there was slight prominence of right hilar nodes, but no other evidence of mediastinal or hilar adenopathy.  A PET scan was thereafter obtained on 05/06/05.  The PET scan did reveal increased FDG activity within a small mass in the posterior superior right lower lobe, highly suspicious for a  malignancy.  No significant nodal FDG uptake was identified within the hilar regions or the mediastinum.  Also noted was abnormal FDG uptake in the right middle lobe, corresponding to the CT scan findings, and again suspicious for a malignancy.  There was no evidence of metastatic disease within the neck, abdomen or pelvis.  The  patient [facility], and was seen by Dr. [name].  The patient underwent a right lower lobe wedge resection with completion lobectomy and mediastinal lymph node biopsies.  The pathology revealed the following:  Right upper lobe wedge resection revealed pulmonary tissue with necrotizing granulomatous inflammation.  No evidence of malignancy.  Right middle lobe wedge revealed again pulmonary tissue with necrotizing granulomatous inflammation.  No evidence of malignancy.  The right pleural lobe (lobectomy) revealed an adenocarcinoma, poorly differentiated, grade 3, measuring 1.2 cm.  The visceral pleura was negative.  Chest wall negative.  Mediastinum negative.  All margins were negative, including the pleural and parenchymal margins.  There was no evidence of lymphovascular invasion.  In all lymph nodes sampled, including level 11, level 7, level 12, 4R and 2R lymph nodes negative for malignancy.  The patient was staged as T1 N0 M0, stage IA adenocarcinoma of the right lung.  Postoperatively the patient's course was complicated by the development of pleural effusion, requiring diuresis.  She had multiple x-rays performed.  Last x-ray performed on 07/07/05 revealed persistent right pleural effusion.  It was recommended that the patient have followups at [facility].  Dr. [name] Baxley kindly refers the patient to me today for medical oncology evaluation.  Clinically, the patient states that she has recovered well.  She is once again walking.  She was a runner, and she is trying to get back in shape."  LEFT BREAST CANCER HISTORY:From [name]'s 09/30/2009 note:  "She tells me that most recently she had her yearly screening mammogram performed that revealed an abnormality.  She also on exam was noted to have a palpable lump in the upper left breast as well.  Because of the abnormality, patient on September 02, 2009 had a digital diagnostic mammogram of the left breast and ultrasound of the  left breast.  The mammogram revealed a 7.0 mm area slightly increased density in the upper left breast.  There were no suspicious calcifications noted.  The breast was heterogeneously dense.  Patient then went onto have an ultrasound of the breast performed and again a 7.0 mm irregular hypoechoic shadowing mass was noted in the 12 o'clock position of the left breast 5.0 cm from the nipple.  There were no enlarged or abnormal left axillary lymph nodes identified.  Patient went onto have a core biopsy performed on 09/02/2009 [id number]  The needle core biopsy of the 12 o'clock mass revealed an invasive mammary carcinoma, low to intermediate grade.  It was lobular carcinoma.  Confirmatory immunohistochemical stains revealed the tumor to be strongly positive for cytokeratin AE1-AE3 with an infiltrative pattern of growth and the tumor was negative for E-cadherin stain confirming a lobular phenotype.  The tumor was estrogen receptor positive at 98%, progesterone receptor positive 6%, proliferation marker Ki-67 by MIB was 14%.  The tumor did not express HER-2/neu by CISH with a signal of 1.04.  Patient was seen by Dr. Neldon Mc on 09/08/2009 for discussion of surgical options.  His recommendation was a lumpectomy.  Patient also had bilateral MRI of the breasts performed, which revealed a 0.8 cm mildly enlarged area of mass-like enhancement at  the 12 o'clock location corresponding to the biopsy-proven breast cancer.  There was no evidence of lymphadenopathy."   PAST MEDICAL HISTORY: Past Medical History:  Diagnosis Date  . Anemia   . BRCA negative 2011   BRCA I/ II negative  . Breast cancer ([place]) 08/2009   stage 2, rx with lumpectomy and xrt  . COPD (chronic obstructive pulmonary disease) (HCC)    pt.unsure of diagnosis status  . Emphysema of lung ([place])    pt. questions diagnosis  . Family history of adverse reaction to anesthesia    My Sister has nausea  . Family history of breast cancer   .  Family history of colon cancer   . Family history of lung cancer   . GERD (gastroesophageal reflux disease)    not presently having symptoms  . Lung cancer ([place]) 06/06/05   stage 1 poorly differentiated adenocarcinoma, s/p right lower lobectomy.  . Osteoporosis   . Personal history of lung cancer   . STD (sexually transmitted disease)    HSV    PAST SURGICAL HISTORY: Past Surgical History:  Procedure Laterality Date  . BREAST BIOPSY    . BREAST LUMPECTOMY  10/12/2009   Left lumpectomy and radiation, stage II, ER/PR+, Her 2 nu negative  . BUNIONECTOMY    . COLONOSCOPY    . LOBECTOMY  06/06/2005   RLL  . MASTECTOMY W/ SENTINEL NODE BIOPSY Bilateral 03/15/2018  . MASTECTOMY W/ SENTINEL NODE BIOPSY Bilateral 03/15/2018   Procedure: BILATERAL TOTAL MASTECTOMIES WITH RIGHT SENTINEL LYMPH NODE BIOPSY;  Surgeon: Rolm Bookbinder, MD;  Location: [place];  Service: General;  Laterality: Bilateral;  . PORTACATH PLACEMENT N/A 03/15/2018   Procedure: INSERTION PORT-A-CATH WITH [place];  Surgeon: Rolm Bookbinder, MD;  Location: [place];  Service: General;  Laterality: N/A;  . TONSILLECTOMY    . TUBAL LIGATION  1984    FAMILY HISTORY Family History  Problem Relation Age of Onset  . Allergies Mother   . Asthma Mother   . Lung cancer Mother   . Breast cancer Mother 71       recurrence age 28  . Colon cancer Father 17  . Prostate cancer Brother 21  . Breast cancer Sister 51       Recurrence age 62 BRCA negative  . Colon polyps Sister   . Breast cancer Sister 29  . Prostate cancer Brother 24  . Breast cancer Maternal Grandmother 74  . Colon cancer Maternal Aunt   . Leukemia Maternal Grandfather   . Lung cancer Maternal Aunt   . Breast cancer Cousin   . Esophageal cancer Neg Hx   . Rectal cancer Neg Hx   . Stomach cancer Neg Hx   The patient's maternal grandmother was diagnosed with cancer at age 68. Patient's mother was diagnosed at age 10. The patient's sister was diagnosed at age 51  with recurrence at 57 and is BRCA negative. A second sister was diagnosed at age 98.  GYNECOLOGIC HISTORY:  No LMP recorded. Patient is postmenopausal. Menarche age 70, the patient is GX P0. She went through the change of life in approximately age 69. She did not take hormone replacement.  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 It's just the 2 of them at home. She takes care of 55-year-old for a neighbor and is the primary caregiver for her husband who has had a couple of small strokes and has myelodysplasia. She helps a friend out  with her business. She walks her 23 year old lab. 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 Smoking status: Former Smoker    Packs/day: 2.00    Years: 18.00    Pack years: 36.00    Last attempt to quit: 07/12/1987    Years since quitting: 31.0  . Smokeless tobacco: Never Used  Substance Use Topics  . Alcohol use: Yes    Alcohol/week: 4.0 standard drinks    Types: 4 Standard drinks or equivalent per week    Comment: social  . Drug use: No    Allergies  Allergen Reactions  . Taxol [Paclitaxel] Anaphylaxis  . Clarithromycin Rash    Current Outpatient Medications  Medication Sig Dispense Refill  . acyclovir (ZOVIRAX) 400 MG tablet TAKE 1 TABLET BY MOUTH TWICE A DAY 90 tablet 3  . calcium gluconate 500 MG tablet Take 500 mg by mouth daily.    . cholecalciferol (VITAMIN D) 1000 UNITS tablet Take 1,000 Units by mouth daily.    . Psyllium (METAMUCIL PO) Take 5 mLs by mouth daily.     . tamoxifen (NOLVADEX) 20 MG tablet Take 1 tablet (20 mg total) by mouth daily for 30 days. 90 tablet 12  . triamcinolone (KENALOG) 0.025 % cream Apply 1 application topically 4 (four) times daily. 80 g 2  . vitamin C (ASCORBIC ACID) 500 MG tablet Take 500 mg by mouth daily.      No current facility-administered medications for this visit.     OBJECTIVE: Middle-aged white woman in no acute distress  Vitals:   08/01/18 0823  BP: (!) 169/82  Pulse: 71  Resp: 18  Temp: (!) 97.5 F (36.4 C)  SpO2: 100%     Body mass index is 18.67 kg/m.    ECOG FS:1 - Symptomatic but completely ambulatory  Sclerae unicteric, EOMs intact Oropharynx clear and moist No cervical or supraclavicular adenopathy Lungs no rales or rhonchi Heart regular rate and rhythm Abd soft, nontender, positive bowel sounds MSK no focal spinal tenderness, no upper extremity lymphedema Neuro: nonfocal, well oriented, appropriate affect Breasts: Status post bilateral mastectomies.  There is no evidence of chest wall recurrence.  Both axillae are benign.    CMP     Component Value Date/Time   NA 139 07/12/2018 1056   NA 139 12/22/2016 1141   K 4.5 07/12/2018 1056   K 4.2 12/22/2016 1141   CL 103 07/12/2018 1056   CL 103 11/22/2012 1025   CO2 28 07/12/2018 1056   CO2 28 12/22/2016 1141   GLUCOSE 94 07/12/2018 1056   GLUCOSE 92 12/22/2016 1141   GLUCOSE 94 11/22/2012 1025   BUN 16 07/12/2018 1056   BUN 13.2 12/22/2016 1141   CREATININE 0.76 07/12/2018 1056   CREATININE 0.64 11/07/2017 0906   CREATININE 0.7 12/22/2016 1141   CALCIUM 9.8 07/12/2018 1056   CALCIUM 9.7 12/22/2016 1141   PROT 7.3 07/12/2018 1056   PROT 7.0 12/22/2016 1141   ALBUMIN 4.1 07/12/2018 1056   ALBUMIN 3.8 12/22/2016 1141   AST 23 07/12/2018 1056   AST 18 12/22/2016 1141   ALT 16 07/12/2018 1056   ALT 10 12/22/2016 1141   ALKPHOS 84 07/12/2018 1056   ALKPHOS 81 12/22/2016 1141   BILITOT 0.3 07/12/2018 1056   BILITOT 0.65 12/22/2016 1141   GFRNONAA >60 07/12/2018 1056   GFRNONAA 93 11/07/2017 0906  GFRAA >60 07/12/2018 1056   GFRAA 108 11/07/2017 0906    INo results found for: SPEP, UPEP  Lab Results  Component Value Date   WBC 4.8 08/01/2018   NEUTROABS 3.4 08/01/2018   HGB 11.4 (L) 08/01/2018    HCT 35.7 (L) 08/01/2018   MCV 95.7 08/01/2018   PLT 205 08/01/2018      Chemistry      Component Value Date/Time   NA 139 07/12/2018 1056   NA 139 12/22/2016 1141   K 4.5 07/12/2018 1056   K 4.2 12/22/2016 1141   CL 103 07/12/2018 1056   CL 103 11/22/2012 1025   CO2 28 07/12/2018 1056   CO2 28 12/22/2016 1141   BUN 16 07/12/2018 1056   BUN 13.2 12/22/2016 1141   CREATININE 0.76 07/12/2018 1056   CREATININE 0.64 11/07/2017 0906   CREATININE 0.7 12/22/2016 1141      Component Value Date/Time   CALCIUM 9.8 07/12/2018 1056   CALCIUM 9.7 12/22/2016 1141   ALKPHOS 84 07/12/2018 1056   ALKPHOS 81 12/22/2016 1141   AST 23 07/12/2018 1056   AST 18 12/22/2016 1141   ALT 16 07/12/2018 1056   ALT 10 12/22/2016 1141   BILITOT 0.3 07/12/2018 1056   BILITOT 0.65 12/22/2016 1141       Lab Results  Component Value Date   LABCA2 18 09/30/2009    No components found for: [id number]  No results for input(s): INR in the last 168 hours.  Urinalysis    Component Value Date/Time   BILIRUBINUR neg 09/12/2013 1012   PROTEINUR neg 09/12/2013 1012   UROBILINOGEN negative 09/12/2013 1012   NITRITE neg 09/12/2013 1012   LEUKOCYTESUR Negative 09/12/2013 1012    STUDIES: No results found.   ASSESSMENT: 68 y.o. [place], [place] woman status post right upper lobe wedge resection, middle lobe wedge resection, lower lobectomy and mediastinal lymph node dissection 06/06/2005 for a 1.2 cm grade 3 adenocarcinoma, pT1 pN1, stage Ia  (a) followed at [facility] with every other year chest CT, most recently 06/06/2017  LEFT BREAST CANCER: (1) status post left breast upper outer quadrant biopsy 09/02/2009 for an invasive lobular carcinoma (E-cadherin negative) estrogen receptor 98% positive, progesterone receptor 6% positive, with an MIB-1 of 14% and no HER-2 amplification. [[id number] [id number]]  (2) status post left lumpectomy and sentinel lymph node sampling 10/12/2009 for a pT1b pN1, stage IIA invasive  lobular breast cancer, with negative margins.  (3) Oncotype DX score of 16 predicts a risk of outside the breast recurrence of 10% if the patient's only systemic treatment is tamoxifen for 5 years. It also predicts no benefit from adjuvant chemotherapy  (4) completed adjuvant radiation therapy 01/28/2010, receiving 5040 cGy to the left breast, with a boost to the upper inner aspect of the breast (to a cumulative dose of 6300 cGy); the axillary and supraclavicular regions received 4500 cGy  (5) started anastrozole July 2011, completedI 5 years July 2016  RIGHT BREAST CANCER: (6) status post right breast upper outer quadrant biopsy 01/29/2018 for a clinical  T1c N0, stage IA invasive ductal carcinoma, grade 2, estrogen receptor positive, progesterone receptor negative, HER-2 amplified, with an MIB-1 of 15%  (7) status post bilateral mastectomy with right sentinel lymph node sampling 03/15/2018, showing  (a) on the left, no malignancy noted  (b) on the right, a pT1c pN0,stage IA invasive ductal carcinoma, grade 2, with negative margins.  (c) a total of 6 lymph nodes removed from the right axilla, none from the  left  (8) chemotherapy consisting of paclitaxel weekly x12 started 04/26/2018, with trastuzumab to be given for 1 year  (a) myocardial perfusion study 12/20/2017 showed an ejection fraction of 75% (hyperdynamic  (b) paclitaxel switched to Abraxane with the dose #2 because of initial reaction to Taxol  (c) Abraxane discontinued 07/13/2018 with continuing side effects  (d) received a total of 10 paclitaxel last Abraxane doses of 12 planned  (9) adjuvant radiation not indicated  (10) tamoxifen started 08/01/2018  (11) bone density 02/21/2012 at [facility] showed osteoporosis with a T score of -2.5; on alendronate  (12) repeat genetics testing 02/08/2018 offered through [facility] Common Hereditary Cancers Panel and 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 There were no  deleterious mutations in: APC, ATM, AXIN2, BARD1, BMPR1A, BRCA1, BRCA2, BRIP1, CDH1, CDKN2A (p14ARF), CDKN2A (p16INK4a), CKD4, CHEK2, CTNNA1, DICER1, EPCAM (Deletion/duplication testing only), GREM1 (promoter region deletion/duplication testing only), KIT, MEN1, MLH1, MSH2, MSH3, MSH6, MUTYH, NBN, NF1, NHTL1, PALB2, PDGFRA, PMS2, POLD1, POLE, PTEN, RAD50, RAD51C, RAD51D, SDHB, SDHC, SDHD, SMAD4, SMARCA4. STK11, TSC1, TSC2, and VHL.  The following genes were evaluated for sequence changes only: SDHA and HOXB13 c.251G>A variant only.  (a) see "cancer surveillance" for details of long term Maylon Peppers related screening studies   (i) s/p bilateral mastectomies   (ii) colonoscopy recommended every 2 to 5 years   (iii) whole breast MRI with brain MRI every year   (iiii) consider referral to a Li-Fraumeni syndrome clinic  PLAN: Billiejo got through the vast majority of her chemotherapy, with a rash being the most significant problem.  Luckily that has completely resolved as the Taxol treatments were stopped.  We are now continuing the trastuzumab every 3 weeks through October.  She is already scheduled for her next echocardiogram in mid March.  Her last bone density was remote.  We will repeat one at the breast center in about a month.  We are now ready to start antiestrogens and we did again discussed tamoxifen in detail.  She has a good understanding of the possible toxicities, side effects and complications of this agent and she took it briefly before her surgery with no side effects that she remembers.  We are going back on that agent and continuing for a minimum of 5 years  She will see me after the next echocardiogram  She knows to call for any other issue that may develop before that visit.    Travus Oren, Virgie Dad, MD  08/01/18 8:46 AM Medical Oncology and Hematology [facility] [address] [place], [place] [address] Tel. [phone]    Fax. [phone]   I, Jacqualyn Posey  am acting as a Education administrator for Chauncey Cruel, MD.   I, Lurline Del MD, have reviewed the above documentation for accuracy and completeness, and I agree with the above.    CANCER SURVEILLANCE:The following is recommended for following individuals with LFS includes:  1. Children and adults should undergo comprehensive annual physical examination including careful skin and neurological exams with the focus on risks for rare early onset cancer and second malignancies in cancer survivors. 2. To reduce the risk for breast cancer, prophylactic bilateral mastectomy is the most effective option.  3. To reduce the risk for colon cancer, adults should consider colonoscopy every 2-5years beginning no later than age 5 years.  4.Whole body MRI with brain MRI annually.It may be helpful to participate in a Li-Fraumeni Syndrome clinic so that updates on screening and surveillance for the condition  may be conveyed.

## 2018-07-26 ENCOUNTER — Other Ambulatory Visit: Payer: Medicare Other

## 2018-07-26 ENCOUNTER — Ambulatory Visit: Payer: Medicare Other

## 2018-07-27 ENCOUNTER — Telehealth: Payer: Self-pay | Admitting: Emergency Medicine

## 2018-07-27 NOTE — Telephone Encounter (Signed)
Called and spoke with Patient.  She was requesting to get scheduled for Prevnar 13 in February.  Injection scheduled for 09/03/18, at 1100. Nothing further at this time.  Per RB's 07/16/18 OV- You need to get the Prevnar-13 immunization in mid-to-late February.  Follow with Dr Lamonte Sakai in 6 months or sooner if you have any problems

## 2018-08-01 ENCOUNTER — Inpatient Hospital Stay: Payer: Medicare Other

## 2018-08-01 ENCOUNTER — Inpatient Hospital Stay (HOSPITAL_BASED_OUTPATIENT_CLINIC_OR_DEPARTMENT_OTHER): Payer: Medicare Other | Admitting: Oncology

## 2018-08-01 ENCOUNTER — Telehealth: Payer: Self-pay | Admitting: Oncology

## 2018-08-01 VITALS — BP 169/82 | HR 71 | Temp 97.5°F | Resp 18 | Ht 66.0 in | Wt 115.7 lb

## 2018-08-01 DIAGNOSIS — Z170 Estrogen receptor positive status [ER+]: Secondary | ICD-10-CM

## 2018-08-01 DIAGNOSIS — C50212 Malignant neoplasm of upper-inner quadrant of left female breast: Secondary | ICD-10-CM | POA: Diagnosis not present

## 2018-08-01 DIAGNOSIS — M810 Age-related osteoporosis without current pathological fracture: Secondary | ICD-10-CM

## 2018-08-01 DIAGNOSIS — Z853 Personal history of malignant neoplasm of breast: Secondary | ICD-10-CM | POA: Diagnosis not present

## 2018-08-01 DIAGNOSIS — M8580 Other specified disorders of bone density and structure, unspecified site: Secondary | ICD-10-CM

## 2018-08-01 DIAGNOSIS — R21 Rash and other nonspecific skin eruption: Secondary | ICD-10-CM

## 2018-08-01 DIAGNOSIS — Z79899 Other long term (current) drug therapy: Secondary | ICD-10-CM

## 2018-08-01 DIAGNOSIS — Z87891 Personal history of nicotine dependence: Secondary | ICD-10-CM

## 2018-08-01 DIAGNOSIS — Z95828 Presence of other vascular implants and grafts: Secondary | ICD-10-CM

## 2018-08-01 DIAGNOSIS — Z5112 Encounter for antineoplastic immunotherapy: Secondary | ICD-10-CM | POA: Diagnosis not present

## 2018-08-01 DIAGNOSIS — Z85118 Personal history of other malignant neoplasm of bronchus and lung: Secondary | ICD-10-CM | POA: Diagnosis not present

## 2018-08-01 LAB — CBC WITH DIFFERENTIAL/PLATELET
Abs Immature Granulocytes: 0.03 10*3/uL (ref 0.00–0.07)
BASOS ABS: 0 10*3/uL (ref 0.0–0.1)
Basophils Relative: 0 %
Eosinophils Absolute: 0.1 10*3/uL (ref 0.0–0.5)
Eosinophils Relative: 2 %
HEMATOCRIT: 35.7 % — AB (ref 36.0–46.0)
Hemoglobin: 11.4 g/dL — ABNORMAL LOW (ref 12.0–15.0)
Immature Granulocytes: 1 %
Lymphocytes Relative: 17 %
Lymphs Abs: 0.8 10*3/uL (ref 0.7–4.0)
MCH: 30.6 pg (ref 26.0–34.0)
MCHC: 31.9 g/dL (ref 30.0–36.0)
MCV: 95.7 fL (ref 80.0–100.0)
Monocytes Absolute: 0.5 10*3/uL (ref 0.1–1.0)
Monocytes Relative: 9 %
Neutro Abs: 3.4 10*3/uL (ref 1.7–7.7)
Neutrophils Relative %: 71 %
Platelets: 205 10*3/uL (ref 150–400)
RBC: 3.73 MIL/uL — ABNORMAL LOW (ref 3.87–5.11)
RDW: 13.9 % (ref 11.5–15.5)
WBC: 4.8 10*3/uL (ref 4.0–10.5)
nRBC: 0 % (ref 0.0–0.2)

## 2018-08-01 LAB — COMPREHENSIVE METABOLIC PANEL
ALK PHOS: 88 U/L (ref 38–126)
ALT: 12 U/L (ref 0–44)
AST: 19 U/L (ref 15–41)
Albumin: 4.1 g/dL (ref 3.5–5.0)
Anion gap: 9 (ref 5–15)
BUN: 19 mg/dL (ref 8–23)
CALCIUM: 9.2 mg/dL (ref 8.9–10.3)
CO2: 26 mmol/L (ref 22–32)
Chloride: 106 mmol/L (ref 98–111)
Creatinine, Ser: 0.7 mg/dL (ref 0.44–1.00)
GFR calc Af Amer: >60 mL/min (ref >60)
GFR calc non Af Amer: >60 mL/min (ref >60)
Glucose, Bld: 88 mg/dL (ref 70–99)
Potassium: 4.3 mmol/L (ref 3.5–5.1)
Sodium: 141 mmol/L (ref 135–145)
Total Bilirubin: 0.5 mg/dL (ref 0.3–1.2)
Total Protein: 7.3 g/dL (ref 6.5–8.1)

## 2018-08-01 MED ORDER — HEPARIN SOD (PORK) LOCK FLUSH 100 UNIT/ML IV SOLN
500.0000 [IU] | Freq: Once | INTRAVENOUS | Status: AC | PRN
  Administered 2018-08-01: 500 [IU]
  Filled 2018-08-01: qty 5

## 2018-08-01 MED ORDER — ACETAMINOPHEN 325 MG PO TABS
ORAL_TABLET | ORAL | Status: AC
  Filled 2018-08-01: qty 2

## 2018-08-01 MED ORDER — SODIUM CHLORIDE 0.9% FLUSH
10.0000 mL | INTRAVENOUS | Status: DC | PRN
  Administered 2018-08-01: 10 mL
  Filled 2018-08-01: qty 10

## 2018-08-01 MED ORDER — TAMOXIFEN CITRATE 20 MG PO TABS: 20.0000 mg | ORAL_TABLET | Freq: Every day | ORAL | 12 refills | Status: AC

## 2018-08-01 MED ORDER — ACETAMINOPHEN 325 MG PO TABS
650.0000 mg | ORAL_TABLET | Freq: Once | ORAL | Status: AC
  Administered 2018-08-01: 650 mg via ORAL

## 2018-08-01 MED ORDER — TRASTUZUMAB CHEMO 150 MG IV SOLR
300.0000 mg | Freq: Once | INTRAVENOUS | Status: AC
  Administered 2018-08-01: 300 mg via INTRAVENOUS
  Filled 2018-08-01: qty 14.29

## 2018-08-01 MED ORDER — SODIUM CHLORIDE 0.9% FLUSH
10.0000 mL | Freq: Once | INTRAVENOUS | Status: AC
  Administered 2018-08-01: 10 mL
  Filled 2018-08-01: qty 10

## 2018-08-01 MED ORDER — DIPHENHYDRAMINE HCL 25 MG PO CAPS
ORAL_CAPSULE | ORAL | Status: AC
  Filled 2018-08-01: qty 1

## 2018-08-01 MED ORDER — DIPHENHYDRAMINE HCL 25 MG PO CAPS
25.0000 mg | ORAL_CAPSULE | Freq: Once | ORAL | Status: AC
  Administered 2018-08-01: 25 mg via ORAL

## 2018-08-01 MED ORDER — SODIUM CHLORIDE 0.9 % IV SOLN
Freq: Once | INTRAVENOUS | Status: AC
  Administered 2018-08-01: 10:00:00 via INTRAVENOUS
  Filled 2018-08-01: qty 250

## 2018-08-01 NOTE — Patient Instructions (Signed)
[facility] Discharge Instructions for Patients Receiving Chemotherapy  Today you received the following chemotherapy agents Herceptin  To help prevent nausea and vomiting after your treatment, we encourage you to take your nausea medication as directed   If you develop nausea and vomiting that is not controlled by your nausea medication, call the clinic.   BELOW ARE SYMPTOMS THAT SHOULD BE REPORTED IMMEDIATELY:  *FEVER GREATER THAN 100.5 F  *CHILLS WITH OR WITHOUT FEVER  NAUSEA AND VOMITING THAT IS NOT CONTROLLED WITH YOUR NAUSEA MEDICATION  *UNUSUAL SHORTNESS OF BREATH  *UNUSUAL BRUISING OR BLEEDING  TENDERNESS IN MOUTH AND THROAT WITH OR WITHOUT PRESENCE OF ULCERS  *URINARY PROBLEMS  *BOWEL PROBLEMS  UNUSUAL RASH Items with * indicate a potential emergency and should be followed up as soon as possible.  Feel free to call the clinic should you have any questions or concerns. The clinic phone number is [phone].  Please show the CHEMO ALERT CARD at check-in to the Emergency Department and triage nurse.   

## 2018-08-01 NOTE — Telephone Encounter (Signed)
Scheduled appt per 01/22 los.  Printed avs per patient request.

## 2018-08-02 ENCOUNTER — Other Ambulatory Visit: Payer: Medicare Other

## 2018-08-02 ENCOUNTER — Ambulatory Visit: Payer: Medicare Other

## 2018-08-09 DIAGNOSIS — M25572 Pain in left ankle and joints of left foot: Secondary | ICD-10-CM | POA: Diagnosis not present

## 2018-08-22 ENCOUNTER — Inpatient Hospital Stay: Payer: Medicare Other

## 2018-08-22 ENCOUNTER — Inpatient Hospital Stay: Payer: Medicare Other | Attending: Oncology

## 2018-08-22 VITALS — BP 167/78 | HR 71 | Temp 97.7°F | Resp 16

## 2018-08-22 DIAGNOSIS — Z452 Encounter for adjustment and management of vascular access device: Secondary | ICD-10-CM | POA: Diagnosis present

## 2018-08-22 DIAGNOSIS — Z170 Estrogen receptor positive status [ER+]: Secondary | ICD-10-CM | POA: Insufficient documentation

## 2018-08-22 DIAGNOSIS — C50212 Malignant neoplasm of upper-inner quadrant of left female breast: Secondary | ICD-10-CM

## 2018-08-22 DIAGNOSIS — Z5112 Encounter for antineoplastic immunotherapy: Secondary | ICD-10-CM | POA: Diagnosis not present

## 2018-08-22 DIAGNOSIS — Z95828 Presence of other vascular implants and grafts: Secondary | ICD-10-CM

## 2018-08-22 LAB — CBC WITH DIFFERENTIAL/PLATELET
ABS IMMATURE GRANULOCYTES: 0.01 10*3/uL (ref 0.00–0.07)
Basophils Absolute: 0 10*3/uL (ref 0.0–0.1)
Basophils Relative: 1 %
Eosinophils Absolute: 0.1 10*3/uL (ref 0.0–0.5)
Eosinophils Relative: 3 %
HCT: 35.5 % — ABNORMAL LOW (ref 36.0–46.0)
Hemoglobin: 11.3 g/dL — ABNORMAL LOW (ref 12.0–15.0)
Immature Granulocytes: 0 %
Lymphocytes Relative: 23 %
Lymphs Abs: 0.9 10*3/uL (ref 0.7–4.0)
MCH: 30.7 pg (ref 26.0–34.0)
MCHC: 31.8 g/dL (ref 30.0–36.0)
MCV: 96.5 fL (ref 80.0–100.0)
Monocytes Absolute: 0.3 10*3/uL (ref 0.1–1.0)
Monocytes Relative: 8 %
Neutro Abs: 2.6 10*3/uL (ref 1.7–7.7)
Neutrophils Relative %: 65 %
Platelets: 215 10*3/uL (ref 150–400)
RBC: 3.68 MIL/uL — ABNORMAL LOW (ref 3.87–5.11)
RDW: 13.1 % (ref 11.5–15.5)
WBC: 3.9 10*3/uL — ABNORMAL LOW (ref 4.0–10.5)
nRBC: 0 % (ref 0.0–0.2)

## 2018-08-22 LAB — COMPREHENSIVE METABOLIC PANEL
ALT: 9 U/L (ref 0–44)
AST: 18 U/L (ref 15–41)
Albumin: 3.9 g/dL (ref 3.5–5.0)
Alkaline Phosphatase: 70 U/L (ref 38–126)
Anion gap: 8 (ref 5–15)
BILIRUBIN TOTAL: 0.5 mg/dL (ref 0.3–1.2)
BUN: 20 mg/dL (ref 8–23)
CO2: 27 mmol/L (ref 22–32)
Calcium: 9.1 mg/dL (ref 8.9–10.3)
Chloride: 107 mmol/L (ref 98–111)
Creatinine, Ser: 0.73 mg/dL (ref 0.44–1.00)
GFR calc Af Amer: >60 mL/min (ref >60)
GFR calc non Af Amer: >60 mL/min (ref >60)
Glucose, Bld: 84 mg/dL (ref 70–99)
POTASSIUM: 4.1 mmol/L (ref 3.5–5.1)
Sodium: 142 mmol/L (ref 135–145)
Total Protein: 6.9 g/dL (ref 6.5–8.1)

## 2018-08-22 MED ORDER — SODIUM CHLORIDE 0.9% FLUSH
10.0000 mL | INTRAVENOUS | Status: DC | PRN
  Administered 2018-08-22: 10 mL
  Filled 2018-08-22: qty 10

## 2018-08-22 MED ORDER — HEPARIN SOD (PORK) LOCK FLUSH 100 UNIT/ML IV SOLN
500.0000 [IU] | Freq: Once | INTRAVENOUS | Status: AC | PRN
  Administered 2018-08-22: 500 [IU]
  Filled 2018-08-22: qty 5

## 2018-08-22 MED ORDER — DIPHENHYDRAMINE HCL 25 MG PO CAPS
ORAL_CAPSULE | ORAL | Status: AC
  Filled 2018-08-22: qty 1

## 2018-08-22 MED ORDER — DIPHENHYDRAMINE HCL 25 MG PO CAPS
25.0000 mg | ORAL_CAPSULE | Freq: Once | ORAL | Status: AC
  Administered 2018-08-22: 25 mg via ORAL

## 2018-08-22 MED ORDER — SODIUM CHLORIDE 0.9% FLUSH
10.0000 mL | Freq: Once | INTRAVENOUS | Status: AC
  Administered 2018-08-22: 10 mL
  Filled 2018-08-22: qty 10

## 2018-08-22 MED ORDER — SODIUM CHLORIDE 0.9 % IV SOLN
Freq: Once | INTRAVENOUS | Status: AC
  Administered 2018-08-22: 10:00:00 via INTRAVENOUS
  Filled 2018-08-22: qty 250

## 2018-08-22 MED ORDER — ACETAMINOPHEN 325 MG PO TABS
ORAL_TABLET | ORAL | Status: AC
  Filled 2018-08-22: qty 2

## 2018-08-22 MED ORDER — TRASTUZUMAB CHEMO 150 MG IV SOLR
300.0000 mg | Freq: Once | INTRAVENOUS | Status: AC
  Administered 2018-08-22: 300 mg via INTRAVENOUS
  Filled 2018-08-22: qty 14.3

## 2018-08-22 MED ORDER — ACETAMINOPHEN 325 MG PO TABS
650.0000 mg | ORAL_TABLET | Freq: Once | ORAL | Status: AC
  Administered 2018-08-22: 650 mg via ORAL

## 2018-08-22 NOTE — Patient Instructions (Addendum)
[facility] Discharge Instructions for Patients Receiving Chemotherapy  Today you received the following chemotherapy agents: Herceptin.  To help prevent nausea and vomiting after your treatment, we encourage you to take your nausea medication as directed.   If you develop nausea and vomiting that is not controlled by your nausea medication, call the clinic.   BELOW ARE SYMPTOMS THAT SHOULD BE REPORTED IMMEDIATELY:  *FEVER GREATER THAN 100.5 F  *CHILLS WITH OR WITHOUT FEVER  NAUSEA AND VOMITING THAT IS NOT CONTROLLED WITH YOUR NAUSEA MEDICATION  *UNUSUAL SHORTNESS OF BREATH  *UNUSUAL BRUISING OR BLEEDING  TENDERNESS IN MOUTH AND THROAT WITH OR WITHOUT PRESENCE OF ULCERS  *URINARY PROBLEMS  *BOWEL PROBLEMS  UNUSUAL RASH Items with * indicate a potential emergency and should be followed up as soon as possible.  Feel free to call the clinic should you have any questions or concerns. The clinic phone number is [phone].  Please show the [place] at check-in to the Emergency Department and triage nurse.  \[id number]\

## 2018-09-03 ENCOUNTER — Ambulatory Visit (INDEPENDENT_AMBULATORY_CARE_PROVIDER_SITE_OTHER): Payer: Medicare Other

## 2018-09-03 DIAGNOSIS — Z23 Encounter for immunization: Secondary | ICD-10-CM

## 2018-09-07 DIAGNOSIS — Z23 Encounter for immunization: Secondary | ICD-10-CM

## 2018-09-12 ENCOUNTER — Inpatient Hospital Stay: Payer: Medicare Other

## 2018-09-12 ENCOUNTER — Inpatient Hospital Stay: Payer: Medicare Other | Attending: Oncology

## 2018-09-12 VITALS — BP 163/90 | HR 70 | Temp 98.1°F | Resp 18 | Ht 66.0 in | Wt 120.5 lb

## 2018-09-12 DIAGNOSIS — Z87891 Personal history of nicotine dependence: Secondary | ICD-10-CM | POA: Insufficient documentation

## 2018-09-12 DIAGNOSIS — Z5112 Encounter for antineoplastic immunotherapy: Secondary | ICD-10-CM | POA: Diagnosis not present

## 2018-09-12 DIAGNOSIS — C50212 Malignant neoplasm of upper-inner quadrant of left female breast: Secondary | ICD-10-CM

## 2018-09-12 DIAGNOSIS — Z170 Estrogen receptor positive status [ER+]: Principal | ICD-10-CM

## 2018-09-12 DIAGNOSIS — R232 Flushing: Secondary | ICD-10-CM | POA: Diagnosis not present

## 2018-09-12 DIAGNOSIS — Z95828 Presence of other vascular implants and grafts: Secondary | ICD-10-CM

## 2018-09-12 DIAGNOSIS — Z79899 Other long term (current) drug therapy: Secondary | ICD-10-CM | POA: Insufficient documentation

## 2018-09-12 DIAGNOSIS — Z803 Family history of malignant neoplasm of breast: Secondary | ICD-10-CM | POA: Insufficient documentation

## 2018-09-12 DIAGNOSIS — Z801 Family history of malignant neoplasm of trachea, bronchus and lung: Secondary | ICD-10-CM | POA: Insufficient documentation

## 2018-09-12 DIAGNOSIS — Z85118 Personal history of other malignant neoplasm of bronchus and lung: Secondary | ICD-10-CM | POA: Insufficient documentation

## 2018-09-12 DIAGNOSIS — Z452 Encounter for adjustment and management of vascular access device: Secondary | ICD-10-CM | POA: Diagnosis present

## 2018-09-12 DIAGNOSIS — Z79810 Long term (current) use of selective estrogen receptor modulators (SERMs): Secondary | ICD-10-CM | POA: Insufficient documentation

## 2018-09-12 DIAGNOSIS — Z8042 Family history of malignant neoplasm of prostate: Secondary | ICD-10-CM | POA: Diagnosis not present

## 2018-09-12 LAB — CBC WITH DIFFERENTIAL/PLATELET
Abs Immature Granulocytes: 0.02 10*3/uL (ref 0.00–0.07)
Basophils Absolute: 0 10*3/uL (ref 0.0–0.1)
Basophils Relative: 0 %
Eosinophils Absolute: 0.1 10*3/uL (ref 0.0–0.5)
Eosinophils Relative: 3 %
HCT: 35.7 % — ABNORMAL LOW (ref 36.0–46.0)
Hemoglobin: 11.3 g/dL — ABNORMAL LOW (ref 12.0–15.0)
IMMATURE GRANULOCYTES: 1 %
Lymphocytes Relative: 28 %
Lymphs Abs: 1 10*3/uL (ref 0.7–4.0)
MCH: 30.1 pg (ref 26.0–34.0)
MCHC: 31.7 g/dL (ref 30.0–36.0)
MCV: 94.9 fL (ref 80.0–100.0)
Monocytes Absolute: 0.3 10*3/uL (ref 0.1–1.0)
Monocytes Relative: 9 %
NRBC: 0 % (ref 0.0–0.2)
Neutro Abs: 2.1 10*3/uL (ref 1.7–7.7)
Neutrophils Relative %: 59 %
Platelets: 200 10*3/uL (ref 150–400)
RBC: 3.76 MIL/uL — AB (ref 3.87–5.11)
RDW: 12.8 % (ref 11.5–15.5)
WBC: 3.6 10*3/uL — AB (ref 4.0–10.5)

## 2018-09-12 LAB — COMPREHENSIVE METABOLIC PANEL
ALT: 9 U/L (ref 0–44)
AST: 20 U/L (ref 15–41)
Albumin: 3.6 g/dL (ref 3.5–5.0)
Alkaline Phosphatase: 67 U/L (ref 38–126)
Anion gap: 7 (ref 5–15)
BUN: 18 mg/dL (ref 8–23)
CO2: 27 mmol/L (ref 22–32)
Calcium: 8.8 mg/dL — ABNORMAL LOW (ref 8.9–10.3)
Chloride: 105 mmol/L (ref 98–111)
Creatinine, Ser: 0.72 mg/dL (ref 0.44–1.00)
GFR calc Af Amer: >60 mL/min (ref >60)
Glucose, Bld: 89 mg/dL (ref 70–99)
Potassium: 4.1 mmol/L (ref 3.5–5.1)
Sodium: 139 mmol/L (ref 135–145)
Total Bilirubin: 0.4 mg/dL (ref 0.3–1.2)
Total Protein: 6.8 g/dL (ref 6.5–8.1)

## 2018-09-12 MED ORDER — ACETAMINOPHEN 325 MG PO TABS
650.0000 mg | ORAL_TABLET | Freq: Once | ORAL | Status: AC
  Administered 2018-09-12: 650 mg via ORAL

## 2018-09-12 MED ORDER — ACETAMINOPHEN 325 MG PO TABS
ORAL_TABLET | ORAL | Status: AC
  Filled 2018-09-12: qty 2

## 2018-09-12 MED ORDER — HEPARIN SOD (PORK) LOCK FLUSH 100 UNIT/ML IV SOLN
500.0000 [IU] | Freq: Once | INTRAVENOUS | Status: AC | PRN
  Administered 2018-09-12: 500 [IU]
  Filled 2018-09-12: qty 5

## 2018-09-12 MED ORDER — SODIUM CHLORIDE 0.9 % IV SOLN
Freq: Once | INTRAVENOUS | Status: AC
  Administered 2018-09-12: 09:00:00 via INTRAVENOUS
  Filled 2018-09-12: qty 250

## 2018-09-12 MED ORDER — SODIUM CHLORIDE 0.9% FLUSH
10.0000 mL | Freq: Once | INTRAVENOUS | Status: AC
  Administered 2018-09-12: 10 mL
  Filled 2018-09-12: qty 10

## 2018-09-12 MED ORDER — DIPHENHYDRAMINE HCL 25 MG PO CAPS
25.0000 mg | ORAL_CAPSULE | Freq: Once | ORAL | Status: AC
  Administered 2018-09-12: 25 mg via ORAL

## 2018-09-12 MED ORDER — TRASTUZUMAB CHEMO 150 MG IV SOLR
300.0000 mg | Freq: Once | INTRAVENOUS | Status: AC
  Administered 2018-09-12: 300 mg via INTRAVENOUS
  Filled 2018-09-12: qty 14.29

## 2018-09-12 MED ORDER — SODIUM CHLORIDE 0.9% FLUSH
10.0000 mL | INTRAVENOUS | Status: DC | PRN
  Administered 2018-09-12: 10 mL
  Filled 2018-09-12: qty 10

## 2018-09-12 MED ORDER — DIPHENHYDRAMINE HCL 25 MG PO CAPS
ORAL_CAPSULE | ORAL | Status: AC
  Filled 2018-09-12: qty 1

## 2018-09-12 NOTE — Patient Instructions (Signed)
[facility] Discharge Instructions for Patients Receiving Chemotherapy  Today you received the following chemotherapy agents: Trastuzumab (Herceptin)  To help prevent nausea and vomiting after your treatment, we encourage you to take your nausea medication as directed.    If you develop nausea and vomiting that is not controlled by your nausea medication, call the clinic.   BELOW ARE SYMPTOMS THAT SHOULD BE REPORTED IMMEDIATELY:  *FEVER GREATER THAN 100.5 F  *CHILLS WITH OR WITHOUT FEVER  NAUSEA AND VOMITING THAT IS NOT CONTROLLED WITH YOUR NAUSEA MEDICATION  *UNUSUAL SHORTNESS OF BREATH  *UNUSUAL BRUISING OR BLEEDING  TENDERNESS IN MOUTH AND THROAT WITH OR WITHOUT PRESENCE OF ULCERS  *URINARY PROBLEMS  *BOWEL PROBLEMS  UNUSUAL RASH Items with * indicate a potential emergency and should be followed up as soon as possible.  Feel free to call the clinic should you have any questions or concerns. The clinic phone number is [phone].  Please show the CHEMO ALERT CARD at check-in to the Emergency Department and triage nurse.    

## 2018-09-19 ENCOUNTER — Encounter (HOSPITAL_COMMUNITY): Payer: Self-pay | Admitting: Cardiology

## 2018-09-19 ENCOUNTER — Ambulatory Visit (HOSPITAL_COMMUNITY)
Admission: RE | Admit: 2018-09-19 | Discharge: 2018-09-19 | Disposition: A | Discharge status: Home / Self Care | Payer: Medicare Other | Source: Ambulatory Visit | Attending: Cardiology | Admitting: Cardiology

## 2018-09-19 ENCOUNTER — Other Ambulatory Visit: Payer: Self-pay

## 2018-09-19 VITALS — BP 164/88 | HR 67 | Wt 118.8 lb

## 2018-09-19 DIAGNOSIS — D649 Anemia, unspecified: Secondary | ICD-10-CM | POA: Insufficient documentation

## 2018-09-19 DIAGNOSIS — Z170 Estrogen receptor positive status [ER+]: Secondary | ICD-10-CM | POA: Diagnosis not present

## 2018-09-19 DIAGNOSIS — Z85118 Personal history of other malignant neoplasm of bronchus and lung: Secondary | ICD-10-CM | POA: Insufficient documentation

## 2018-09-19 DIAGNOSIS — J449 Chronic obstructive pulmonary disease, unspecified: Secondary | ICD-10-CM | POA: Insufficient documentation

## 2018-09-19 DIAGNOSIS — Z87891 Personal history of nicotine dependence: Secondary | ICD-10-CM | POA: Insufficient documentation

## 2018-09-19 DIAGNOSIS — C50212 Malignant neoplasm of upper-inner quadrant of left female breast: Secondary | ICD-10-CM

## 2018-09-19 DIAGNOSIS — K219 Gastro-esophageal reflux disease without esophagitis: Secondary | ICD-10-CM | POA: Insufficient documentation

## 2018-09-19 DIAGNOSIS — Z79899 Other long term (current) drug therapy: Secondary | ICD-10-CM | POA: Insufficient documentation

## 2018-09-19 DIAGNOSIS — I272 Pulmonary hypertension, unspecified: Secondary | ICD-10-CM | POA: Insufficient documentation

## 2018-09-19 MED ORDER — CARVEDILOL 6.25 MG PO TABS: 6.2500 mg | ORAL_TABLET | Freq: Two times a day (BID) | ORAL | 3 refills | Status: DC

## 2018-09-19 NOTE — Progress Notes (Signed)
  Echocardiogram 2D Echocardiogram has been performed.  Anna Valentine M 09/19/2018, 10:42 AM

## 2018-09-19 NOTE — Patient Instructions (Signed)
START Carvedilol 6.25mg  (1 tab) twice daily  Your physician has requested that you have an echocardiogram. Echocardiography is a painless test that uses sound waves to create images of your heart. It provides your doctor with information about the size and shape of your heart and how well your heart's chambers and valves are working. This procedure takes approximately one hour. There are no restrictions for this procedure. This will be done at your next follow up visit with Dr. Aundra Dubin.  Follow up with Dr. Aundra Dubin in 6 weeks.

## 2018-09-20 NOTE — Progress Notes (Signed)
Oncology: Dr. Jana Hakim  68 y.o. with history of COPD, prior lung cancer, and breast cancer was referred by Dr. Jana Hakim for cardio-oncology evaluation given use of Herceptin. Patient had breast cancer diagnosed on left in 2/11, ER+/PR+/HER2-.  She had left lumpectomy and radiation.  Diagnosed in 7/19 with right breast cancer, ER+/PR-/HER2+.  She had bilateral mastectomy.  Plan initially for paclitaxel x 12 cycles and Herceptin x 1 year.  Intolerant of paclitaxel, switched to abraxane.    She had atypical chest pain in 6/19, Cardiolite at that time showed no ischemia.  Other than that incident, no cardiac history.  She is a prior smoker but has quit.  No exertional dyspnea or chest pain.    PMH: 1. Bronchiectasis.   2. Lung cancer: Adenocarcinoma.  In 11/06, had RUL wedge resection, right middle lobe wedge resection, and right lower lobectomy.  3. Breast cancer: Diagnosed on left in 2/11, ER+/PR+/HER2-.  She had left lumpectomy and radiation.  Diagnosed in 7/19 with right breast cancer, ER+/PR-/HER2+.  She had bilateral mastectomy.  Plan initially for paclitaxel x 12 cycles and Herceptin x 1 year.  Intolerant of paclitaxel, switched to abraxane.   - Echo (12/19): EF 55-60%, GLS -19%, normal RV size and systolic function, PASP 48 mmHg - Echo (3/20): EF 55%, GLS 13.3%. Normal RV size and systolic function.  4. Chest pain: Cardiolite (6/19) with EF 75%, no ischemia.   Social History   Socioeconomic History  . Marital status: Married    Spouse name: Not on file  . Number of children: 0  . Years of education: Not on file  . Highest education level: Not on file  Occupational History  . Occupation: OFFICE MANAGER    Employer: [employer]  Social Needs  . Financial resource strain: Not on file  . Food insecurity:    Worry: Not on file    Inability: Not on file  . Transportation needs:    Medical: Not on file    Non-medical: Not on file  Tobacco Use  . Smoking status: Former Smoker   Packs/day: 2.00    Years: 18.00    Pack years: 36.00    Last attempt to quit: 07/12/1987    Years since quitting: 31.2  . Smokeless tobacco: Never Used  Substance and Sexual Activity  . Alcohol use: Yes    Alcohol/week: 4.0 standard drinks    Types: 4 Standard drinks or equivalent per week    Comment: social  . Drug use: No  . Sexual activity: Yes  Lifestyle  . Physical activity:    Days per week: Not on file    Minutes per session: Not on file  . Stress: Not on file  Relationships  . Social connections:    Talks on phone: Not on file    Gets together: Not on file    Attends religious service: Not on file    Active member of club or organization: Not on file    Attends meetings of clubs or organizations: Not on file    Relationship status: Not on file  . Intimate partner violence:    Fear of current or ex partner: Not on file    Emotionally abused: Not on file    Physically abused: Not on file    Forced sexual activity: Not on file  Other Topics Concern  . Not on file  Social History Narrative  . Not on file   Family History  Problem Relation Age of Onset  . Allergies  Mother   . Asthma Mother   . Lung cancer Mother   . Breast cancer Mother 84       recurrence age 71  . Colon cancer Father 61  . Prostate cancer Brother 64  . Breast cancer Sister 94       Recurrence age 62 BRCA negative  . Colon polyps Sister   . Breast cancer Sister 24  . Prostate cancer Brother 61  . Breast cancer Maternal Grandmother 62  . Colon cancer Maternal Aunt   . Leukemia Maternal Grandfather   . Lung cancer Maternal Aunt   . Breast cancer Cousin   . Esophageal cancer Neg Hx   . Rectal cancer Neg Hx   . Stomach cancer Neg Hx    ROS: All systems reviewed and negative except as per HPI.   Current Outpatient Medications  Medication Sig Dispense Refill  . acyclovir (ZOVIRAX) 400 MG tablet TAKE 1 TABLET BY MOUTH TWICE A DAY 90 tablet 3  . calcium gluconate 500 MG tablet Take 500 mg by  mouth daily.    . cholecalciferol (VITAMIN D) 1000 UNITS tablet Take 1,000 Units by mouth daily.    . Psyllium (METAMUCIL PO) Take 5 mLs by mouth daily.     . tamoxifen (NOLVADEX) 20 MG tablet Take 20 mg by mouth daily.    . vitamin C (ASCORBIC ACID) 500 MG tablet Take 500 mg by mouth daily.    . carvedilol (COREG) 6.25 MG tablet Take 1 tablet (6.25 mg total) by mouth 2 (two) times daily. 180 tablet 3  . triamcinolone (KENALOG) 0.025 % cream Apply 1 application topically 4 (four) times daily. (Patient not taking: Reported on 09/19/2018) 80 g 2   No current facility-administered medications for this encounter.    BP (!) 164/88   Pulse 67   Wt 53.9 kg (118 lb 12.8 oz)   SpO2 97%   BMI 19.17 kg/m  General: NAD Neck: No JVD, no thyromegaly or thyroid nodule.  Lungs: Mild crackles right base. CV: Nondisplaced PMI.  Heart regular S1/S2, no S3/S4, no murmur.  No peripheral edema.  No carotid bruit.  Normal pedal pulses.  Abdomen: Soft, nontender, no hepatosplenomegaly, no distention.  Skin: Intact without lesions or rashes.  Neurologic: Alert and oriented x 3.  Psych: Normal affect. Extremities: No clubbing or cyanosis.  HEENT: Normal.   Assessment/Plan: 1. Breast cancer: She is currently undergoing treatment including Herceptin for 1 year.  Echo was done today and reviewed.  EF remains in the normal range, but GLS is significantly lower than on the last echo.  Cannot rule out early cardiotoxicity.   - Start Coreg 6.25 mg bid.  - Continue Herceptin for now, but repeat echo in 6 wks rather than 3 months. If EF or strain falls further, will stop Herceptin at that time.  2. Pulmonary hypertension: By echo, PASP 48 mmHg.  She has a history of bronchiectasis and also of extensive right lung resections for lung cancer.  Suspect group 3 pulmonary hypertension from lung disease.   Loralie Champagne 09/20/2018

## 2018-10-02 NOTE — Progress Notes (Signed)
[place]  [phone] [phone]    ID: Anna Valentine DOB: [birth year]  MR#: [id number]  FTD#:[id number]  Patient Care Team: [name], MD as PCP - General (Internal Medicine) Alaynna Kerwood, Virgie Dad, MD as Consulting Physician (Oncology) Collene Gobble, MD as Consulting Physician (Pulmonary Disease) [name], MD as Referring Physician Armbruster, Carlota Raspberry, MD as Consulting Physician (Gastroenterology) Kem Boroughs, [place] (Family Medicine) [name], MD as Consulting Physician (Radiation Oncology) Rolm Bookbinder, MD as Consulting Physician (General Surgery)  CHIEF COMPLAINT:  (1) Estrogen receptor positive left-sided breast cancer (2011)    (2) history of stage IA right lung adenocarcinoma resected November 2006    (3) triple- positive right-sided breast cancer    (4) p53 mutation   CURRENT TREATMENT: Trastuzumab, tamoxifen  INTERVAL HISTORY: Anna Valentine returns today for follow-up and treatment of her triple positive breast cancer.   She continues on trastuzumab. She tolerates this well and without any noticeable side effects.   Anna Valentine's last echocardiogram on 09/19/2018, showed an ejection fraction of 55%. However, per Dr. Claris Gladden note, GLS is significantly lower than on the last echo. She has been scheduled for her next echocardiogram on 11/08/2018.  She is scheduled to undergo bone density testing on 10/26/2018.  She continues on tamoxifen, with good tolerance. She reports brown-ish vaginal discharge and sporadic cramping, and hot flashes 2-3 times per day. She denies sweating or sleep disturbance with hot flashes.   REVIEW OF SYSTEMS: Anna Valentine reports she continues to walk her dog for exercise. She is staying at home due to virus concerns, adding she can walk her dog more frequently. She states her hair is growing back. She reports diarrhea about once a week. The patient denies unusual headaches, visual changes, nausea,  vomiting, stiff neck, dizziness, or gait imbalance. There has been no cough, phlegm production, or pleurisy, no chest pain or pressure, and no change in bladder habits. The patient denies fever, rash, bleeding, unexplained fatigue or unexplained weight loss. A detailed review of systems was otherwise entirely negative.   LUNG CANCER HISTORY:  From Dr. [name] original intake note 07/26/2005: (Lung cancer presentation)  "The patient is a very pleasant 68 year old female without a significant past medical history, who states that in 04/2005 she felt a lump in the left neck.  It did not resolve spontaneously, so she was seen by [facility]. [name], and a CT scan of the neck was performed on 04/25/05.  There was noted to be two lymph nodes on the left, and they were normal in size, but they were asymmetrical.  No other pathologic findings were noted, except for left internal jugular vein, which was atypically positioned.  However, because of these enlarged lymph nodes, a CT scan of the chest was also obtained by Dr. Renold Genta on 04/27/05, and the CT scan did reveal a poorly defined nodular opacity medially in the right upper lobe, measuring 6.5 mg anterior to posterior.  Within the right lower lobe there was noted to be a lobular nodular mass measuring 13 x 13 mm, and was felt to be worrisome for a primary lung carcinoma.  No other nodules or effusions were seen on the left lung.  On the mediastinal window images there was slight prominence of right hilar nodes, but no other evidence of mediastinal or hilar adenopathy.  A PET scan was thereafter obtained on 05/06/05.  The PET scan did reveal increased FDG activity within a small mass in the posterior superior right lower  lobe, highly suspicious for a malignancy.  No significant nodal FDG uptake was identified within the hilar regions or the mediastinum.  Also noted was abnormal FDG uptake in the right middle lobe, corresponding to the CT scan findings, and  again suspicious for a malignancy.  There was no evidence of metastatic disease within the neck, abdomen or pelvis.  The patient was then referred to [facility], and was seen by Dr. Otilio Connors.  The patient underwent a right lower lobe wedge resection with completion lobectomy and mediastinal lymph node biopsies.  The pathology revealed the following:  Right upper lobe wedge resection revealed pulmonary tissue with necrotizing granulomatous inflammation.  No evidence of malignancy.  Right middle lobe wedge revealed again pulmonary tissue with necrotizing granulomatous inflammation.  No evidence of malignancy.  The right pleural lobe (lobectomy) revealed an adenocarcinoma, poorly differentiated, grade 3, measuring 1.2 cm.  The visceral pleura was negative.  Chest wall negative.  Mediastinum negative.  All margins were negative, including the pleural and parenchymal margins.  There was no evidence of lymphovascular invasion.  In all lymph nodes sampled, including level 11, level 7, level 12, 4R and 2R lymph nodes negative for malignancy.  The patient was staged as T1 N0 M0, stage IA adenocarcinoma of the right lung.  Postoperatively the patients course was complicated by the development of pleural effusion, requiring diuresis.  She had multiple x-rays performed.  Last x-ray performed on 07/07/05 revealed persistent right pleural effusion.  It was recommended that the patient have followups [facility].  Dr. [name] Baxley kindly refers the patient to me today for medical oncology evaluation.  Clinically, the patient states that she has recovered well.  She is once again walking.  She was a runner, and she is trying to get back in shape."  LEFT BREAST CANCER HISTORY:From [name]'s 09/30/2009 note:  "She tells me that most recently she had her yearly screening mammogram performed that revealed an abnormality.  She also on exam was noted to have a palpable lump in the upper left breast as well.  Because of  the abnormality, patient on September 02, 2009 had a digital diagnostic mammogram of the left breast and ultrasound of the left breast.  The mammogram revealed a 7.0 mm area slightly increased density in the upper left breast.  There were no suspicious calcifications noted.  The breast was heterogeneously dense.  Patient then went onto have an ultrasound of the breast performed and again a 7.0 mm irregular hypoechoic shadowing mass was noted in the 12 oclock position of the left breast 5.0 cm from the nipple.  There were no enlarged or abnormal left axillary lymph nodes identified.  Patient went onto have a core biopsy performed on 09/02/2009 [phone]).  The needle core biopsy of the 12 oclock mass revealed an invasive mammary carcinoma, low to intermediate grade.  It was lobular carcinoma.  Confirmatory immunohistochemical stains revealed the tumor to be strongly positive for cytokeratin AE1-AE3 with an infiltrative pattern of growth and the tumor was negative for E-cadherin stain confirming a lobular phenotype.  The tumor was estrogen receptor positive at 98%, progesterone receptor positive 6%, proliferation marker Ki-67 by MIB was 14%.  The tumor did not express HER-2/neu by CISH with a signal of 1.04.  Patient was seen by Dr. Neldon Mc on 09/08/2009 for discussion of surgical options.  His recommendation was a lumpectomy.  Patient also had bilateral MRI of the breasts performed, which revealed a 0.8 cm mildly enlarged  area of mass-like enhancement at the 12 oclock location corresponding to the biopsy-proven breast cancer.  There was no evidence of lymphadenopathy."   PAST MEDICAL HISTORY: Past Medical History:  Diagnosis Date   Anemia    BRCA negative 2011   BRCA I/ II negative   Breast cancer ([place]) 08/2009   stage 2, rx with lumpectomy and xrt   COPD (chronic obstructive pulmonary disease) (HCC)    pt.unsure of diagnosis status   Emphysema of lung ([place])    pt. questions diagnosis     Family history of adverse reaction to anesthesia    My Sister has nausea   Family history of breast cancer    Family history of colon cancer    Family history of lung cancer    GERD (gastroesophageal reflux disease)    not presently having symptoms   Lung cancer ([place]) 06/06/05   stage 1 poorly differentiated adenocarcinoma, s/p right lower lobectomy.   Osteoporosis    Personal history of lung cancer    STD (sexually transmitted disease)    HSV    PAST SURGICAL HISTORY: Past Surgical History:  Procedure Laterality Date   BREAST BIOPSY     BREAST LUMPECTOMY  10/12/2009   Left lumpectomy and radiation, stage II, ER/PR+, Her 2 nu negative   BUNIONECTOMY     COLONOSCOPY     LOBECTOMY  06/06/2005   RLL   MASTECTOMY W/ SENTINEL NODE BIOPSY Bilateral 03/15/2018   MASTECTOMY W/ SENTINEL NODE BIOPSY Bilateral 03/15/2018   Procedure: BILATERAL TOTAL MASTECTOMIES WITH RIGHT SENTINEL LYMPH NODE BIOPSY;  Surgeon: Rolm Bookbinder, MD;  Location: [place] [facility];  Service: General;  Laterality: Bilateral;   PORTACATH PLACEMENT N/A 03/15/2018   Procedure: INSERTION PORT-A-CATH WITH [place];  Surgeon: Rolm Bookbinder, MD;  Location: [facility];  Service: General;  Laterality: N/A;   TONSILLECTOMY     TUBAL LIGATION  1984    FAMILY HISTORY Family History  Problem Relation Age of Onset   Allergies Mother    Asthma Mother    Lung cancer Mother    Breast cancer Mother 43       recurrence age 19   Colon cancer Father 4   Prostate cancer Brother 58   Breast cancer Sister 38       Recurrence age 58 BRCA negative   Colon polyps Sister    Breast cancer Sister 6   Prostate cancer Brother 7   Breast cancer Maternal Grandmother 30   Colon cancer Maternal Aunt    Leukemia Maternal Grandfather    Lung cancer Maternal Aunt    Breast cancer Cousin    Esophageal cancer Neg Hx    Rectal cancer Neg Hx    Stomach cancer Neg Hx   The patient's maternal grandmother was  diagnosed with cancer at age 75. Patient's mother was diagnosed at age 20. The patient's sister was diagnosed at age 51 with recurrence at 6 and is BRCA negative. A second sister was diagnosed at age 76.  GYNECOLOGIC HISTORY:  No LMP recorded. Patient is postmenopausal. Menarche age 56, the patient is GX P0. She went through the change of life in approximately age 61. She did not take hormone replacement.  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 It's just the 2 of them at home. She takes care of 26-year-old for a neighbor and is the primary caregiver for her husband who has had a couple of small strokes and has  myelodysplasia. She helps a friend out with her business. She walks her 12 year old lab. 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    ADVANCED DIRECTIVES: In place   HEALTH MAINTENANCE: Social History   Tobacco Use   Smoking status: Former Smoker    Packs/day: 2.00    Years: 18.00    Pack years: 36.00    Last attempt to quit: 07/12/1987    Years since quitting: 31.2   Smokeless tobacco: Never Used  Substance Use Topics   Alcohol use: Yes    Alcohol/week: 4.0 standard drinks    Types: 4 Standard drinks or equivalent per week    Comment: social   Drug use: No    Allergies  Allergen Reactions   Taxol [Paclitaxel] Anaphylaxis   Clarithromycin Rash    Current Outpatient Medications  Medication Sig Dispense Refill   acyclovir (ZOVIRAX) 400 MG tablet TAKE 1 TABLET BY MOUTH TWICE A DAY 90 tablet 3   calcium gluconate 500 MG tablet Take 500 mg by mouth daily.     carvedilol (COREG) 6.25 MG tablet Take 1 tablet (6.25 mg total) by mouth 2 (two) times daily. 180 tablet 3   cholecalciferol (VITAMIN D) 1000 UNITS tablet Take 1,000 Units by mouth daily.     Psyllium (METAMUCIL PO) Take 5 mLs by mouth daily.      tamoxifen (NOLVADEX) 20 MG tablet Take 20 mg by  mouth daily.     triamcinolone (KENALOG) 0.025 % cream Apply 1 application topically 4 (four) times daily. (Patient not taking: Reported on 09/19/2018) 80 g 2   vitamin C (ASCORBIC ACID) 500 MG tablet Take 500 mg by mouth daily.     No current facility-administered medications for this visit.     OBJECTIVE: Middle-aged white woman who appears well  Vitals:   10/03/18 1224  BP: (!) 131/91  Pulse: 61  Resp: 20  Temp: 98.1 F (36.7 C)  SpO2: 100%     Body mass index is 19.06 kg/m.    ECOG FS:1 - Symptomatic but completely ambulatory  Sclerae unicteric, pupils round and equal Oropharynx clear and moist No cervical or supraclavicular adenopathy Lungs no rales or rhonchi Heart regular rate and rhythm Abd soft, nontender, positive bowel sounds MSK no focal spinal tenderness, no upper extremity lymphedema Neuro: nonfocal, well oriented, appropriate affect Breasts: Status post bilateral mastectomies.  There is no evidence of chest wall recurrence.  Both axillae are benign.   CMP     Component Value Date/Time   NA 139 09/12/2018 0833   NA 139 12/22/2016 1141   K 4.1 09/12/2018 0833   K 4.2 12/22/2016 1141   CL 105 09/12/2018 0833   CL 103 11/22/2012 1025   CO2 27 09/12/2018 0833   CO2 28 12/22/2016 1141   GLUCOSE 89 09/12/2018 0833   GLUCOSE 92 12/22/2016 1141   GLUCOSE 94 11/22/2012 1025   BUN 18 09/12/2018 0833   BUN 13.2 12/22/2016 1141   CREATININE 0.72 09/12/2018 0833   CREATININE 0.64 11/07/2017 0906   CREATININE 0.7 12/22/2016 1141   CALCIUM 8.8 (L) 09/12/2018 0833   CALCIUM 9.7 12/22/2016 1141   PROT 6.8 09/12/2018 0833   PROT 7.0 12/22/2016 1141   ALBUMIN 3.6 09/12/2018 0833   ALBUMIN 3.8 12/22/2016 1141   AST 20 09/12/2018 0833   AST 18 12/22/2016 1141   ALT 9 09/12/2018 0833   ALT 10 12/22/2016 1141   ALKPHOS 67 09/12/2018 0833   ALKPHOS  81 12/22/2016 1141   BILITOT 0.4 09/12/2018 0833   BILITOT 0.65 12/22/2016 1141   GFRNONAA >60 09/12/2018 0833    GFRNONAA 93 11/07/2017 0906   GFRAA >60 09/12/2018 0833   GFRAA 108 11/07/2017 0906    INo results found for: SPEP, UPEP  Lab Results  Component Value Date   WBC 4.7 10/03/2018   NEUTROABS 3.0 10/03/2018   HGB 11.5 (L) 10/03/2018   HCT 35.3 (L) 10/03/2018   MCV 93.6 10/03/2018   PLT 215 10/03/2018      Chemistry      Component Value Date/Time   NA 139 09/12/2018 0833   NA 139 12/22/2016 1141   K 4.1 09/12/2018 0833   K 4.2 12/22/2016 1141   CL 105 09/12/2018 0833   CL 103 11/22/2012 1025   CO2 27 09/12/2018 0833   CO2 28 12/22/2016 1141   BUN 18 09/12/2018 0833   BUN 13.2 12/22/2016 1141   CREATININE 0.72 09/12/2018 0833   CREATININE 0.64 11/07/2017 0906   CREATININE 0.7 12/22/2016 1141      Component Value Date/Time   CALCIUM 8.8 (L) 09/12/2018 0833   CALCIUM 9.7 12/22/2016 1141   ALKPHOS 67 09/12/2018 0833   ALKPHOS 81 12/22/2016 1141   AST 20 09/12/2018 0833   AST 18 12/22/2016 1141   ALT 9 09/12/2018 0833   ALT 10 12/22/2016 1141   BILITOT 0.4 09/12/2018 0833   BILITOT 0.65 12/22/2016 1141       Lab Results  Component Value Date   LABCA2 18 09/30/2009    No components found for: [id number]  No results for input(s): INR in the last 168 hours.  Urinalysis    Component Value Date/Time   BILIRUBINUR neg 09/12/2013 1012   PROTEINUR neg 09/12/2013 1012   UROBILINOGEN negative 09/12/2013 1012   NITRITE neg 09/12/2013 1012   LEUKOCYTESUR Negative 09/12/2013 1012    STUDIES: No results found.   ASSESSMENT: 68 y.o. [place], [place] woman status post right upper lobe wedge resection, middle lobe wedge resection, lower lobectomy and mediastinal lymph node dissection 06/06/2005 for a 1.2 cm grade 3 adenocarcinoma, pT1 pN1, stage Ia  (a) followed at [facility] with every other year chest CT, most recently 06/06/2017  LEFT BREAST CANCER: (1) status post left breast upper outer quadrant biopsy 09/02/2009 for an invasive lobular carcinoma (E-cadherin negative)  estrogen receptor 98% positive, progesterone receptor 6% positive, with an MIB-1 of 14% and no HER-2 amplification. [[id number]]  (2) status post left lumpectomy and sentinel lymph node sampling 10/12/2009 for a pT1b pN1, stage IIA invasive lobular breast cancer, with negative margins.  (3) Oncotype DX score of 16 predicts a risk of outside the breast recurrence of 10% if the patient's only systemic treatment is tamoxifen for 5 years. It also predicts no benefit from adjuvant chemotherapy  (4) completed adjuvant radiation therapy 01/28/2010, receiving 5040 cGy to the left breast, with a boost to the upper inner aspect of the breast (to a cumulative dose of 6300 cGy); the axillary and supraclavicular regions received 4500 cGy  (5) started anastrozole July 2011, completedI 5 years July 2016  RIGHT BREAST CANCER: (6) status post right breast upper outer quadrant biopsy 01/29/2018 for a clinical  T1c N0, stage IA invasive ductal carcinoma, grade 2, estrogen receptor positive, progesterone receptor negative, HER-2 amplified, with an MIB-1 of 15%  (7) status post bilateral mastectomy with right sentinel lymph node sampling 03/15/2018, showing  (a) on the left, no malignancy noted  (b) on  the right, a pT1c pN0,stage IA invasive ductal carcinoma, grade 2, with negative margins.  (c) a total of 6 lymph nodes removed from the right axilla, none from the left  (8) chemotherapy consisting of paclitaxel weekly x12 started 04/26/2018, with trastuzumab to be given for 1 year  (a) myocardial perfusion study 12/20/2017 showed an ejection fraction of 75% (hyperdynamic  (b) paclitaxel switched to Abraxane with the dose #2 because of initial reaction to Taxol  (c) Abraxane discontinued 07/13/2018 with continuing side effects  (d) received a total of 10 paclitaxel last Abraxane doses of 12 planned  (9) adjuvant radiation not indicated  (10) tamoxifen started 08/01/2018  (11) bone [facility]/21/2012 at  [facility] showed osteoporosis with a T score of -2.5; on alendronate  (12) repeat genetics testing 02/08/2018 offered through [facility] Common Hereditary Cancers Panel and 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 There were no deleterious mutations in: APC, ATM, AXIN2, BARD1, BMPR1A, BRCA1, BRCA2, BRIP1, CDH1, CDKN2A (p14ARF), CDKN2A (p16INK4a), CKD4, CHEK2, CTNNA1, DICER1, EPCAM (Deletion/duplication testing only), GREM1 (promoter region deletion/duplication testing only), KIT, MEN1, MLH1, MSH2, MSH3, MSH6, MUTYH, NBN, NF1, NHTL1, PALB2, PDGFRA, PMS2, POLD1, POLE, PTEN, RAD50, RAD51C, RAD51D, SDHB, SDHC, SDHD, SMAD4, SMARCA4. STK11, TSC1, TSC2, and VHL.  The following genes were evaluated for sequence changes only: SDHA and HOXB13 c.251G>A variant only.  (a) see "cancer surveillance" for details of long term Maylon Peppers related screening studies   (i) s/p bilateral mastectomies   (ii) colonoscopy recommended every 2 to 5 years: most recent 03/20/2017   (iii) whole breast MRI with brain MRI every year   (iiii) consider referral to a Li-Fraumeni syndrome clinic  PLAN: Najiyah is tolerating the trastuzumab well but there has been a slight change in her heart function.  She is on slightly different medications and she will have a repeat echo early in April.  She will be treated today and she will be treated again 3 weeks from now.  That will be the equivalent of 6 months of treatment.  At that point I am stopping her trastuzumab temporarily.  I am canceling the May 6 and May 27 treatments.  She does have an appointment with me in June and we will see if she is able to keep that appointment or if we do that as a WebEx visit.  Once the coronavirus problem blows over we will resume trastuzumab and she will receive an additional 6 months  We discussed the Li-Fraumeni issue.  She is scheduled for colonoscopy in September under Dr. Havery Moros.  There is a Li-Fraumeni clinic in [place]  and I will try to get her scheduled there sometime this fall.  Otherwise at some point this year she will need an open MRI brain MRI.  She knows to call for any other issues that may develop before the next visit.   Martese Vanatta, Virgie Dad, MD  10/03/18 12:46 PM Medical Oncology and Hematology [facility] [address] [place], [place] [address] Tel. [phone]    Fax. [phone]   I, Wilburn Mylar, am acting as scribe for Dr. Virgie Dad. Sadae Arrazola.  I, Lurline Del MD, have reviewed the above documentation for accuracy and completeness, and I agree with the above.     CANCER SURVEILLANCE:The following is recommended for following individuals with LFS includes:  1. Children and adults should undergo comprehensive annual physical examination including careful skin and neurological exams with the focus on risks for rare early onset cancer and second  malignancies in cancer survivors. 2. To reduce the risk for breast cancer, prophylactic bilateral mastectomy is the most effective option.  3. To reduce the risk for colon cancer, adults should consider colonoscopy every 2-5years beginning no later than age 28 years.  4.Whole body MRI with brain MRI annually.It may be helpful to participate in a Li-Fraumeni Syndrome clinic so that updates on screening and surveillance for the condition may be conveyed.

## 2018-10-03 ENCOUNTER — Other Ambulatory Visit: Payer: Self-pay

## 2018-10-03 ENCOUNTER — Inpatient Hospital Stay: Payer: Medicare Other

## 2018-10-03 ENCOUNTER — Inpatient Hospital Stay (HOSPITAL_BASED_OUTPATIENT_CLINIC_OR_DEPARTMENT_OTHER): Payer: Medicare Other | Admitting: Oncology

## 2018-10-03 VITALS — BP 131/91 | HR 61 | Temp 98.1°F | Resp 20 | Ht 66.0 in | Wt 118.1 lb

## 2018-10-03 DIAGNOSIS — Z170 Estrogen receptor positive status [ER+]: Secondary | ICD-10-CM | POA: Diagnosis not present

## 2018-10-03 DIAGNOSIS — Z79899 Other long term (current) drug therapy: Secondary | ICD-10-CM | POA: Diagnosis not present

## 2018-10-03 DIAGNOSIS — C50212 Malignant neoplasm of upper-inner quadrant of left female breast: Secondary | ICD-10-CM

## 2018-10-03 DIAGNOSIS — Z5112 Encounter for antineoplastic immunotherapy: Secondary | ICD-10-CM | POA: Diagnosis not present

## 2018-10-03 DIAGNOSIS — Z79810 Long term (current) use of selective estrogen receptor modulators (SERMs): Secondary | ICD-10-CM | POA: Diagnosis not present

## 2018-10-03 DIAGNOSIS — R232 Flushing: Secondary | ICD-10-CM | POA: Diagnosis not present

## 2018-10-03 DIAGNOSIS — Z85118 Personal history of other malignant neoplasm of bronchus and lung: Secondary | ICD-10-CM

## 2018-10-03 DIAGNOSIS — Z801 Family history of malignant neoplasm of trachea, bronchus and lung: Secondary | ICD-10-CM

## 2018-10-03 DIAGNOSIS — Z803 Family history of malignant neoplasm of breast: Secondary | ICD-10-CM

## 2018-10-03 DIAGNOSIS — Z8042 Family history of malignant neoplasm of prostate: Secondary | ICD-10-CM

## 2018-10-03 DIAGNOSIS — Z95828 Presence of other vascular implants and grafts: Secondary | ICD-10-CM

## 2018-10-03 DIAGNOSIS — Z87891 Personal history of nicotine dependence: Secondary | ICD-10-CM

## 2018-10-03 LAB — CBC WITH DIFFERENTIAL/PLATELET
Abs Immature Granulocytes: 0.02 K/uL (ref 0.00–0.07)
Basophils Absolute: 0 K/uL (ref 0.0–0.1)
Basophils Relative: 0 %
Eosinophils Absolute: 0.1 K/uL (ref 0.0–0.5)
Eosinophils Relative: 2 %
HCT: 35.3 % — ABNORMAL LOW (ref 36.0–46.0)
Hemoglobin: 11.5 g/dL — ABNORMAL LOW (ref 12.0–15.0)
Immature Granulocytes: 0 %
Lymphocytes Relative: 24 %
Lymphs Abs: 1.1 K/uL (ref 0.7–4.0)
MCH: 30.5 pg (ref 26.0–34.0)
MCHC: 32.6 g/dL (ref 30.0–36.0)
MCV: 93.6 fL (ref 80.0–100.0)
Monocytes Absolute: 0.5 K/uL (ref 0.1–1.0)
Monocytes Relative: 10 %
Neutro Abs: 3 K/uL (ref 1.7–7.7)
Neutrophils Relative %: 64 %
Platelets: 215 K/uL (ref 150–400)
RBC: 3.77 MIL/uL — ABNORMAL LOW (ref 3.87–5.11)
RDW: 13 % (ref 11.5–15.5)
WBC: 4.7 K/uL (ref 4.0–10.5)
nRBC: 0 % (ref 0.0–0.2)

## 2018-10-03 LAB — COMPREHENSIVE METABOLIC PANEL WITH GFR
ALT: 10 U/L (ref 0–44)
AST: 19 U/L (ref 15–41)
Albumin: 3.8 g/dL (ref 3.5–5.0)
Alkaline Phosphatase: 59 U/L (ref 38–126)
Anion gap: 8 (ref 5–15)
BUN: 18 mg/dL (ref 8–23)
CO2: 28 mmol/L (ref 22–32)
Calcium: 8.7 mg/dL — ABNORMAL LOW (ref 8.9–10.3)
Chloride: 105 mmol/L (ref 98–111)
Creatinine, Ser: 0.73 mg/dL (ref 0.44–1.00)
GFR calc Af Amer: >60 mL/min
GFR calc non Af Amer: >60 mL/min
Glucose, Bld: 89 mg/dL (ref 70–99)
Potassium: 4.4 mmol/L (ref 3.5–5.1)
Sodium: 141 mmol/L (ref 135–145)
Total Bilirubin: 0.4 mg/dL (ref 0.3–1.2)
Total Protein: 7.1 g/dL (ref 6.5–8.1)

## 2018-10-03 MED ORDER — HEPARIN SOD (PORK) LOCK FLUSH 100 UNIT/ML IV SOLN
500.0000 [IU] | Freq: Once | INTRAVENOUS | Status: AC | PRN
  Administered 2018-10-03: 500 [IU]
  Filled 2018-10-03: qty 5

## 2018-10-03 MED ORDER — SODIUM CHLORIDE 0.9 % IV SOLN
Freq: Once | INTRAVENOUS | Status: AC
  Administered 2018-10-03: 14:00:00 via INTRAVENOUS
  Filled 2018-10-03: qty 250

## 2018-10-03 MED ORDER — TRASTUZUMAB CHEMO 150 MG IV SOLR
300.0000 mg | Freq: Once | INTRAVENOUS | Status: AC
  Administered 2018-10-03: 300 mg via INTRAVENOUS
  Filled 2018-10-03: qty 14.29

## 2018-10-03 MED ORDER — SODIUM CHLORIDE 0.9% FLUSH
10.0000 mL | Freq: Once | INTRAVENOUS | Status: AC
  Administered 2018-10-03: 10 mL
  Filled 2018-10-03: qty 10

## 2018-10-03 MED ORDER — ACETAMINOPHEN 325 MG PO TABS: 650.0000 mg | ORAL_TABLET | Freq: Once | ORAL | Status: DC

## 2018-10-03 MED ORDER — SODIUM CHLORIDE 0.9% FLUSH
10.0000 mL | INTRAVENOUS | Status: DC | PRN
  Administered 2018-10-03: 10 mL
  Filled 2018-10-03: qty 10

## 2018-10-03 MED ORDER — DIPHENHYDRAMINE HCL 25 MG PO CAPS: 25.0000 mg | ORAL_CAPSULE | Freq: Once | ORAL | Status: DC

## 2018-10-03 MED ORDER — LIDOCAINE-PRILOCAINE 2.5-2.5 % EX CREA: 1.0000 "application " | TOPICAL_CREAM | CUTANEOUS | 0 refills | Status: DC | PRN

## 2018-10-03 NOTE — Patient Instructions (Addendum)
Coronavirus (COVID-19) Are you at risk?  Are you at risk for the Coronavirus (COVID-19)?  To be considered HIGH RISK for Coronavirus (COVID-19), you have to meet the following criteria:  . Traveled to [place], [place], [place], [place] or [place]; or in the [place] to [place], [place], [place], or [place]; and have fever, cough, and shortness of breath within the last 2 weeks of travel OR . Been in close contact with a person diagnosed with COVID-19 within the last 2 weeks and have fever, cough, and shortness of breath . IF YOU DO NOT MEET THESE CRITERIA, YOU ARE CONSIDERED LOW RISK FOR COVID-19.  What to do if you are HIGH RISK for COVID-19?  . If you are having a medical emergency, call 911. . Seek medical care right away. Before you go to a doctor's office, urgent care or emergency department, call ahead and tell them about your recent travel, contact with someone diagnosed with COVID-19, and your symptoms. You should receive instructions from your physician's office regarding next steps of care.  . When you arrive at healthcare provider, tell the healthcare staff immediately you have returned from visiting [place], [place], [place], [place] or [place]; or traveled in the [place] to [place], [place], [place], or [place]; in the last two weeks or you have been in close contact with a person diagnosed with COVID-19 in the last 2 weeks.   . Tell the health care staff about your symptoms: fever, cough and shortness of breath. . After you have been seen by a medical provider, you will be either: o Tested for (COVID-19) and discharged home on quarantine except to seek medical care if symptoms worsen, and asked to  - Stay home and avoid contact with others until you get your results (4-5 days)  - Avoid travel on public transportation if possible (such as bus, train, or airplane) or o Sent to the Emergency Department by EMS for evaluation, COVID-19 testing, and possible  admission depending on your condition and test results.  What to do if you are LOW RISK for COVID-19?  Reduce your risk of any infection by using the same precautions used for avoiding the common cold or flu:  . Wash your hands often with soap and warm water for at least 20 seconds.  If soap and water are not readily available, use an alcohol-based hand sanitizer with at least 60% alcohol.  . If coughing or sneezing, cover your mouth and nose by coughing or sneezing into the elbow areas of your shirt or coat, into a tissue or into your sleeve (not your hands). . Avoid shaking hands with others and consider head nods or verbal greetings only. . Avoid touching your eyes, nose, or mouth with unwashed hands.  . Avoid close contact with people who are sick. . Avoid places or events with large numbers of people in one location, like concerts or sporting events. . Carefully consider travel plans you have or are making. . If you are planning any travel outside or inside the [place], visit the CDC's Travelers' Health webpage for the latest health notices. . If you have some symptoms but not all symptoms, continue to monitor at home and seek medical attention if your symptoms worsen. . If you are having a medical emergency, call 911.  ADDITIONAL HEALTHCARE OPTIONS FOR PATIENTS  [facility] / e-Visit: [email or web address]         [facility]: [phone]  [facility]: [facility]: [facility] Discharge Instructions for Patients Receiving Chemotherapy  Today you received the following chemotherapy agents: Trastuzumab (Herceptin)  To help prevent nausea and vomiting after your treatment, we encourage you to take your nausea medication as directed.    If you develop nausea and vomiting that is not controlled by your nausea medication, call the clinic.    BELOW ARE SYMPTOMS THAT SHOULD BE REPORTED IMMEDIATELY:  *FEVER GREATER THAN 100.5 F  *CHILLS WITH OR WITHOUT FEVER  NAUSEA AND VOMITING THAT IS NOT CONTROLLED WITH YOUR NAUSEA MEDICATION  *UNUSUAL SHORTNESS OF BREATH  *UNUSUAL BRUISING OR BLEEDING  TENDERNESS IN MOUTH AND THROAT WITH OR WITHOUT PRESENCE OF ULCERS  *URINARY PROBLEMS  *BOWEL PROBLEMS  UNUSUAL RASH Items with * indicate a potential emergency and should be followed up as soon as possible.  Feel free to call the clinic should you have any questions or concerns. The clinic phone number is [phone].  Please show the [facility] at check-in to the Emergency Department and triage nurse.

## 2018-10-03 NOTE — Addendum Note (Signed)
Addended by: Chauncey Cruel on: 10/03/2018 04:21 PM   Modules accepted: Orders

## 2018-10-19 ENCOUNTER — Other Ambulatory Visit: Payer: Self-pay | Admitting: Internal Medicine

## 2018-10-24 ENCOUNTER — Other Ambulatory Visit: Payer: Self-pay

## 2018-10-24 ENCOUNTER — Inpatient Hospital Stay: Payer: Medicare Other | Attending: Oncology

## 2018-10-24 ENCOUNTER — Inpatient Hospital Stay: Payer: Medicare Other

## 2018-10-24 VITALS — BP 164/90 | HR 64 | Temp 97.5°F | Resp 18

## 2018-10-24 DIAGNOSIS — Z5112 Encounter for antineoplastic immunotherapy: Secondary | ICD-10-CM | POA: Insufficient documentation

## 2018-10-24 DIAGNOSIS — C50212 Malignant neoplasm of upper-inner quadrant of left female breast: Secondary | ICD-10-CM

## 2018-10-24 DIAGNOSIS — Z87891 Personal history of nicotine dependence: Secondary | ICD-10-CM | POA: Insufficient documentation

## 2018-10-24 DIAGNOSIS — Z170 Estrogen receptor positive status [ER+]: Secondary | ICD-10-CM

## 2018-10-24 DIAGNOSIS — Z9013 Acquired absence of bilateral breasts and nipples: Secondary | ICD-10-CM | POA: Insufficient documentation

## 2018-10-24 DIAGNOSIS — C50211 Malignant neoplasm of upper-inner quadrant of right female breast: Secondary | ICD-10-CM | POA: Insufficient documentation

## 2018-10-24 DIAGNOSIS — Z853 Personal history of malignant neoplasm of breast: Secondary | ICD-10-CM | POA: Insufficient documentation

## 2018-10-24 DIAGNOSIS — Z95828 Presence of other vascular implants and grafts: Secondary | ICD-10-CM

## 2018-10-24 LAB — CBC WITH DIFFERENTIAL/PLATELET
Abs Immature Granulocytes: 0.01 10*3/uL (ref 0.00–0.07)
Basophils Absolute: 0 10*3/uL (ref 0.0–0.1)
Basophils Relative: 1 %
Eosinophils Absolute: 0.1 10*3/uL (ref 0.0–0.5)
Eosinophils Relative: 3 %
HCT: 35.7 % — ABNORMAL LOW (ref 36.0–46.0)
Hemoglobin: 11.5 g/dL — ABNORMAL LOW (ref 12.0–15.0)
Immature Granulocytes: 0 %
Lymphocytes Relative: 26 %
Lymphs Abs: 1 10*3/uL (ref 0.7–4.0)
MCH: 30.8 pg (ref 26.0–34.0)
MCHC: 32.2 g/dL (ref 30.0–36.0)
MCV: 95.7 fL (ref 80.0–100.0)
Monocytes Absolute: 0.4 10*3/uL (ref 0.1–1.0)
Monocytes Relative: 10 %
Neutro Abs: 2.4 10*3/uL (ref 1.7–7.7)
Neutrophils Relative %: 60 %
Platelets: 226 10*3/uL (ref 150–400)
RBC: 3.73 MIL/uL — ABNORMAL LOW (ref 3.87–5.11)
RDW: 12.7 % (ref 11.5–15.5)
WBC: 4 10*3/uL (ref 4.0–10.5)
nRBC: 0 % (ref 0.0–0.2)

## 2018-10-24 LAB — COMPREHENSIVE METABOLIC PANEL
ALT: 7 U/L (ref 0–44)
AST: 17 U/L (ref 15–41)
Albumin: 3.6 g/dL (ref 3.5–5.0)
Alkaline Phosphatase: 61 U/L (ref 38–126)
Anion gap: 10 (ref 5–15)
BUN: 16 mg/dL (ref 8–23)
CO2: 25 mmol/L (ref 22–32)
Calcium: 8.9 mg/dL (ref 8.9–10.3)
Chloride: 106 mmol/L (ref 98–111)
Creatinine, Ser: 0.76 mg/dL (ref 0.44–1.00)
GFR calc Af Amer: >60 mL/min (ref >60)
GFR calc non Af Amer: >60 mL/min (ref >60)
Glucose, Bld: 89 mg/dL (ref 70–99)
Potassium: 4.4 mmol/L (ref 3.5–5.1)
Sodium: 141 mmol/L (ref 135–145)
Total Bilirubin: 0.4 mg/dL (ref 0.3–1.2)
Total Protein: 7 g/dL (ref 6.5–8.1)

## 2018-10-24 MED ORDER — SODIUM CHLORIDE 0.9% FLUSH
10.0000 mL | INTRAVENOUS | Status: DC | PRN
  Filled 2018-10-24: qty 10

## 2018-10-24 MED ORDER — TRASTUZUMAB CHEMO 150 MG IV SOLR
300.0000 mg | Freq: Once | INTRAVENOUS | Status: AC
  Administered 2018-10-24: 300 mg via INTRAVENOUS
  Filled 2018-10-24: qty 14.29

## 2018-10-24 MED ORDER — HEPARIN SOD (PORK) LOCK FLUSH 100 UNIT/ML IV SOLN
500.0000 [IU] | Freq: Once | INTRAVENOUS | Status: DC | PRN
  Filled 2018-10-24: qty 5

## 2018-10-24 MED ORDER — DIPHENHYDRAMINE HCL 25 MG PO CAPS: 25.0000 mg | ORAL_CAPSULE | Freq: Once | ORAL | Status: DC

## 2018-10-24 MED ORDER — ACETAMINOPHEN 325 MG PO TABS: 650.0000 mg | ORAL_TABLET | Freq: Once | ORAL | Status: DC

## 2018-10-24 MED ORDER — SODIUM CHLORIDE 0.9 % IV SOLN
Freq: Once | INTRAVENOUS | Status: AC
  Administered 2018-10-24: 10:00:00 via INTRAVENOUS
  Filled 2018-10-24: qty 250

## 2018-10-24 MED ORDER — SODIUM CHLORIDE 0.9% FLUSH
10.0000 mL | Freq: Once | INTRAVENOUS | Status: AC
  Administered 2018-10-24: 10 mL
  Filled 2018-10-24: qty 10

## 2018-10-24 NOTE — Patient Instructions (Signed)
Coronavirus (COVID-19) Are you at risk?  Are you at risk for the Coronavirus (COVID-19)?  To be considered HIGH RISK for Coronavirus (COVID-19), you have to meet the following criteria:  . Traveled to [place], [place], [place], [place] or [place]; or in the [place] to [place], [place], [place], or [place]; and have fever, cough, and shortness of breath within the last 2 weeks of travel OR . Been in close contact with a person diagnosed with COVID-19 within the last 2 weeks and have fever, cough, and shortness of breath . IF YOU DO NOT MEET THESE CRITERIA, YOU ARE CONSIDERED LOW RISK FOR COVID-19.  What to do if you are HIGH RISK for COVID-19?  . If you are having a medical emergency, call 911. . Seek medical care right away. Before you go to a doctor's office, urgent care or emergency department, call ahead and tell them about your recent travel, contact with someone diagnosed with COVID-19, and your symptoms. You should receive instructions from your physician's office regarding next steps of care.  . When you arrive at healthcare provider, tell the healthcare staff immediately you have returned from visiting [place], [place], [place], [place] or [place]; or traveled in the [place] to [place], [place], [place], or [place]; in the last two weeks or you have been in close contact with a person diagnosed with COVID-19 in the last 2 weeks.   . Tell the health care staff about your symptoms: fever, cough and shortness of breath. . After you have been seen by a medical provider, you will be either: o Tested for (COVID-19) and discharged home on quarantine except to seek medical care if symptoms worsen, and asked to  - Stay home and avoid contact with others until you get your results (4-5 days)  - Avoid travel on public transportation if possible (such as bus, train, or airplane) or o Sent to the Emergency Department by EMS for evaluation, COVID-19 testing, and possible  admission depending on your condition and test results.  What to do if you are LOW RISK for COVID-19?  Reduce your risk of any infection by using the same precautions used for avoiding the common cold or flu:  . Wash your hands often with soap and warm water for at least 20 seconds.  If soap and water are not readily available, use an alcohol-based hand sanitizer with at least 60% alcohol.  . If coughing or sneezing, cover your mouth and nose by coughing or sneezing into the elbow areas of your shirt or coat, into a tissue or into your sleeve (not your hands). . Avoid shaking hands with others and consider head nods or verbal greetings only. . Avoid touching your eyes, nose, or mouth with unwashed hands.  . Avoid close contact with people who are sick. . Avoid places or events with large numbers of people in one location, like concerts or sporting events. . Carefully consider travel plans you have or are making. . If you are planning any travel outside or inside the [place], visit the CDC's Travelers' Health webpage for the latest health notices. . If you have some symptoms but not all symptoms, continue to monitor at home and seek medical attention if your symptoms worsen. . If you are having a medical emergency, call 911.  ADDITIONAL HEALTHCARE OPTIONS FOR PATIENTS  [facility] / e-Visit: [email or web address]         [facility]: [phone]  [facility]: [facility]: [facility] Discharge Instructions for Patients Receiving Chemotherapy  Today you received the following chemotherapy agents: Trastuzumab (Herceptin)  To help prevent nausea and vomiting after your treatment, we encourage you to take your nausea medication as directed.    If you develop nausea and vomiting that is not controlled by your nausea medication, call the clinic.    BELOW ARE SYMPTOMS THAT SHOULD BE REPORTED IMMEDIATELY:  *FEVER GREATER THAN 100.5 F  *CHILLS WITH OR WITHOUT FEVER  NAUSEA AND VOMITING THAT IS NOT CONTROLLED WITH YOUR NAUSEA MEDICATION  *UNUSUAL SHORTNESS OF BREATH  *UNUSUAL BRUISING OR BLEEDING  TENDERNESS IN MOUTH AND THROAT WITH OR WITHOUT PRESENCE OF ULCERS  *URINARY PROBLEMS  *BOWEL PROBLEMS  UNUSUAL RASH Items with * indicate a potential emergency and should be followed up as soon as possible.  Feel free to call the clinic should you have any questions or concerns. The clinic phone number is [phone].  Please show the [facility] at check-in to the Emergency Department and triage nurse.

## 2018-10-26 ENCOUNTER — Other Ambulatory Visit: Payer: Medicare Other

## 2018-11-06 ENCOUNTER — Telehealth: Payer: Self-pay | Admitting: Internal Medicine

## 2018-11-06 NOTE — Telephone Encounter (Signed)
LVM to CB and schedule CPE, AWV and Labs after 11/15/2018

## 2018-11-06 NOTE — Telephone Encounter (Signed)
Pt CB and scheduled appointments

## 2018-11-08 ENCOUNTER — Ambulatory Visit (HOSPITAL_COMMUNITY)
Admission: RE | Admit: 2018-11-08 | Discharge: 2018-11-08 | Disposition: A | Discharge status: Home / Self Care | Payer: Medicare Other | Source: Ambulatory Visit | Attending: Internal Medicine | Admitting: Internal Medicine

## 2018-11-08 ENCOUNTER — Other Ambulatory Visit: Payer: Self-pay

## 2018-11-08 ENCOUNTER — Encounter (HOSPITAL_COMMUNITY): Payer: Medicare Other | Admitting: Cardiology

## 2018-11-08 DIAGNOSIS — J449 Chronic obstructive pulmonary disease, unspecified: Secondary | ICD-10-CM | POA: Diagnosis not present

## 2018-11-08 DIAGNOSIS — C50212 Malignant neoplasm of upper-inner quadrant of left female breast: Secondary | ICD-10-CM | POA: Insufficient documentation

## 2018-11-08 DIAGNOSIS — Z170 Estrogen receptor positive status [ER+]: Secondary | ICD-10-CM | POA: Insufficient documentation

## 2018-11-08 NOTE — Progress Notes (Signed)
  Echocardiogram 2D Echocardiogram has been performed.  Anna Valentine 11/08/2018, 10:59 AM

## 2018-11-09 ENCOUNTER — Other Ambulatory Visit: Payer: Self-pay | Admitting: Oncology

## 2018-11-12 ENCOUNTER — Telehealth (HOSPITAL_COMMUNITY): Payer: Self-pay | Admitting: *Deleted

## 2018-11-12 ENCOUNTER — Telehealth (HOSPITAL_COMMUNITY): Payer: Self-pay

## 2018-11-12 DIAGNOSIS — Z170 Estrogen receptor positive status [ER+]: Principal | ICD-10-CM

## 2018-11-12 DIAGNOSIS — C50212 Malignant neoplasm of upper-inner quadrant of left female breast: Secondary | ICD-10-CM

## 2018-11-12 MED ORDER — CANDESARTAN CILEXETIL 4 MG PO TABS: 4.0000 mg | ORAL_TABLET | Freq: Two times a day (BID) | ORAL | 5 refills | Status: DC

## 2018-11-12 NOTE — Telephone Encounter (Signed)
Pt left VM stating Candesartan is $413/month and she cant afford it. Pt wants another medication called in to her pharmacy.  Message routed to [place]

## 2018-11-12 NOTE — Telephone Encounter (Signed)
-----   Message from Larey Dresser, MD sent at 11/09/2018  2:57 PM EDT ----- Hyrum Shaneyfelt/Jennifer:  EF is mildly lower.  Would add candesartan 4 mg bid and hold Herceptin for now.  Continue Coreg.  Repeat echo in 6 weeks.  I can do a telehealth visit with her to discuss if needed.   Gus: LV looks a little worse, would hold the Herceptin for 6 wks or so until we get repeat echo.

## 2018-11-12 NOTE — Addendum Note (Signed)
Addended by: Valeda Malm on: 11/12/2018 02:50 PM   Modules accepted: Orders

## 2018-11-12 NOTE — Telephone Encounter (Signed)
Spoke with patient, aware of echo results.  Pt aware will hold herceptin for now.  She spoke with her oncologist that will hold herceptin at least until June.  Pt will start candesarten 4mg  bid and continue coreg. Pt verbalized understanding and appreciative. Pt advised if she feels lightheaded or dizzy after starting new medicine, to contact office.

## 2018-11-12 NOTE — Telephone Encounter (Signed)
Losartan 25 mg daily

## 2018-11-13 ENCOUNTER — Telehealth (HOSPITAL_COMMUNITY): Payer: Self-pay

## 2018-11-13 MED ORDER — LOSARTAN POTASSIUM 25 MG PO TABS: 25.0000 mg | ORAL_TABLET | Freq: Every day | ORAL | 3 refills | Status: DC

## 2018-11-13 NOTE — Telephone Encounter (Signed)
Pt noted diarrhea while taking coreg, pt however unsure if it is related to coreg but did read that it was a side effect.  Per Dr Aundra Dubin, diarrhea is uncommon and patient can try to take probiotic to see if it helps otherwise f/u with PCP.  Pt made aware of same and appreciative.

## 2018-11-13 NOTE — Telephone Encounter (Signed)
-----   Message from Larey Dresser, MD sent at 11/12/2018 10:53 PM EDT ----- Would not change Coreg at this time, diarrhea is pretty uncommon side effect.  Would take probiotic and see if it stops on its own.

## 2018-11-14 ENCOUNTER — Other Ambulatory Visit: Payer: Medicare Other

## 2018-11-14 ENCOUNTER — Ambulatory Visit: Payer: Medicare Other

## 2018-12-05 ENCOUNTER — Other Ambulatory Visit: Payer: Medicare Other

## 2018-12-05 ENCOUNTER — Ambulatory Visit: Payer: Medicare Other

## 2018-12-07 DIAGNOSIS — C50411 Malignant neoplasm of upper-outer quadrant of right female breast: Secondary | ICD-10-CM | POA: Diagnosis not present

## 2018-12-07 DIAGNOSIS — Z1501 Genetic susceptibility to malignant neoplasm of breast: Secondary | ICD-10-CM | POA: Diagnosis not present

## 2018-12-13 ENCOUNTER — Encounter: Payer: Self-pay | Admitting: Internal Medicine

## 2018-12-13 DIAGNOSIS — H16223 Keratoconjunctivitis sicca, not specified as Sjogren's, bilateral: Secondary | ICD-10-CM | POA: Diagnosis not present

## 2018-12-13 DIAGNOSIS — H0102A Squamous blepharitis right eye, upper and lower eyelids: Secondary | ICD-10-CM | POA: Diagnosis not present

## 2018-12-13 DIAGNOSIS — H40013 Open angle with borderline findings, low risk, bilateral: Secondary | ICD-10-CM | POA: Diagnosis not present

## 2018-12-13 DIAGNOSIS — H04123 Dry eye syndrome of bilateral lacrimal glands: Secondary | ICD-10-CM | POA: Diagnosis not present

## 2018-12-13 LAB — HM DIABETES EYE EXAM

## 2018-12-25 NOTE — Progress Notes (Signed)
[place]  [phone] [phone]    ID: Anna Valentine DOB: 07-30-1950  MR#: [id number]  OVZ#:[id number]  Patient Care Team: [name], MD as PCP - General (Internal Medicine) Hendrick Pavich, Virgie Dad, MD as Consulting Physician (Oncology) Collene Gobble, MD as Consulting Physician (Pulmonary Disease) [name], MD as Referring Physician Armbruster, Carlota Raspberry, MD as Consulting Physician (Gastroenterology) Kem Boroughs, [place] (Family Medicine) [name], MD as Consulting Physician (Radiation Oncology) Rolm Bookbinder, MD as Consulting Physician (General Surgery)   CHIEF COMPLAINT:  (1) Estrogen receptor positive left-sided breast cancer (2011)    (2) history of stage IA right lung adenocarcinoma resected November 2006    (3) triple- positive right-sided breast cancer    (4) homozygous p53 mutation/ Li-Fraumeni syndrome   CURRENT TREATMENT: tamoxifen   INTERVAL HISTORY: Anna Valentine was seen today for follow-up and treatment of her triple positive breast cancer.  Since her last visit here she completed her trastuzumab treatments, with the last dose on 10/24/2018. She tolerated this well and without any noticeable side effects, other than concerns regarding the ejection fraction.  She is continuing on losartan and carvedilol and will have a repeat echocardiogram 12/28/2018. Anna Valentine's last echocardiogram on 11/08/2018, showed an ejection fraction in the 50% - 55% range.    She also continues on tamoxifen. She does get daily hot flashes, without diaphoresis; they do not disturb her sleep. She does have some vaginal discharge, but it does not bother her.  She is scheduled for a bone density scan on 01/31/2019. Mammography not needed due to bilateral mastectomies.  She is also scheduled for repeat colonoscopy under Dr. Havery Moros in September.   REVIEW OF SYSTEMS: Anna Valentine walks her dog twice a day for about 20 minutes per walk for  exercise. She does the shopping. Anna Valentine notes that her sister passed away a month ago, which has been a stressor for her. The patient denies unusual headaches, visual changes, nausea, vomiting, or dizziness. There has been no unusual cough, phlegm production, or pleurisy. This been no change in bowel or bladder habits. The patient denies unexplained fatigue or unexplained weight loss, bleeding, rash, or fever. A detailed review of systems was otherwise noncontributory.    LUNG CANCER HISTORY:  From Dr. [name] original intake note 07/26/2005: (Lung cancer presentation)  "The patient is a very pleasant 68 year old female without a significant past medical history, who states that in 04/2005 she felt a lump in the left neck.  It did not resolve spontaneously, so she was seen by Dr. [name] [facility], and a CT scan of the neck was performed on 04/25/05.  There was noted to be two lymph nodes on the left, and they were normal in size, but they were asymmetrical.  No other pathologic findings were noted, except for left internal jugular vein, which was atypically positioned.  However, because of these enlarged lymph nodes, a CT scan of the chest was also obtained by Dr. Renold Genta on 04/27/05, and the CT scan did reveal a poorly defined nodular opacity medially in the right upper lobe, measuring 6.5 mg anterior to posterior.  Within the right lower lobe there was noted to be a lobular nodular mass measuring 13 x 13 mm, and was felt to be worrisome for a primary lung carcinoma.  No other nodules or effusions were seen on the left lung.  On the mediastinal window images there was slight prominence of right hilar nodes, but no other evidence of mediastinal or hilar  adenopathy.  A PET scan was thereafter obtained on 05/06/05.  The PET scan did reveal increased FDG activity within a small mass in the posterior superior right lower lobe, highly suspicious for a malignancy.  No significant nodal FDG uptake was  identified within the hilar regions or the mediastinum.  Also noted was abnormal FDG uptake in the right middle lobe, corresponding to the CT scan findings, and again suspicious for a malignancy.  There was no evidence of metastatic disease within the neck, abdomen or pelvis.  The patient was then referred to [facility], and was seen by Dr. Otilio Connors.  The patient underwent a right lower lobe wedge resection with completion lobectomy and mediastinal lymph node biopsies.  The pathology revealed the following:  Right upper lobe wedge resection revealed pulmonary tissue with necrotizing granulomatous inflammation.  No evidence of malignancy.  Right middle lobe wedge revealed again pulmonary tissue with necrotizing granulomatous inflammation.  No evidence of malignancy.  The right pleural lobe (lobectomy) revealed an adenocarcinoma, poorly differentiated, grade 3, measuring 1.2 cm.  The visceral pleura was negative.  Chest wall negative.  Mediastinum negative.  All margins were negative, including the pleural and parenchymal margins.  There was no evidence of lymphovascular invasion.  In all lymph nodes sampled, including level 11, level 7, level 12, 4R and 2R lymph nodes negative for malignancy.  The patient was staged as T1 N0 M0, stage IA adenocarcinoma of the right lung.  Postoperatively the patients course was complicated by the development of pleural effusion, requiring diuresis.  She had multiple x-rays performed.  Last x-ray performed on 07/07/05 revealed persistent right pleural effusion.  It was recommended that the patient have followups at [facility].  Dr. [name] Baxley kindly refers the patient to me today for medical oncology evaluation.  Clinically, the patient states that she has recovered well.  She is once again walking.  She was a runner, and she is trying to get back in shape."  LEFT BREAST CANCER HISTORY:From [name]'s 09/30/2009 note:  "She tells me that most recently she had her  yearly screening mammogram performed that revealed an abnormality.  She also on exam was noted to have a palpable lump in the upper left breast as well.  Because of the abnormality, patient on September 02, 2009 had a digital diagnostic mammogram of the left breast and ultrasound of the left breast.  The mammogram revealed a 7.0 mm area slightly increased density in the upper left breast.  There were no suspicious calcifications noted.  The breast was heterogeneously dense.  Patient then went onto have an ultrasound of the breast performed and again a 7.0 mm irregular hypoechoic shadowing mass was noted in the 12 oclock position of the left breast 5.0 cm from the nipple.  There were no enlarged or abnormal left axillary lymph nodes identified.  Patient went onto have a core biopsy performed on 09/02/2009 [id number]).  The needle core biopsy of the 12 oclock mass revealed an invasive mammary carcinoma, low to intermediate grade.  It was lobular carcinoma.  Confirmatory immunohistochemical stains revealed the tumor to be strongly positive for cytokeratin AE1-AE3 with an infiltrative pattern of growth and the tumor was negative for E-cadherin stain confirming a lobular phenotype.  The tumor was estrogen receptor positive at 98%, progesterone receptor positive 6%, proliferation marker Ki-67 by MIB was 14%.  The tumor did not express HER-2/neu by CISH with a signal of 1.04.  Patient was seen by Dr. Neldon Mc on  09/08/2009 for discussion of surgical options.  His recommendation was a lumpectomy.  Patient also had bilateral MRI of the breasts performed, which revealed a 0.8 cm mildly enlarged area of mass-like enhancement at the 12 oclock location corresponding to the biopsy-proven breast cancer.  There was no evidence of lymphadenopathy."   PAST MEDICAL HISTORY: Past Medical History:  Diagnosis Date   Anemia    BRCA negative 2011   BRCA I/ II negative   Breast cancer ([place]) 08/2009   stage 2, rx with  lumpectomy and xrt   COPD (chronic obstructive pulmonary disease) (HCC)    pt.unsure of diagnosis status   Emphysema of lung ([place])    pt. questions diagnosis   Family history of adverse reaction to anesthesia    My Sister has nausea   Family history of breast cancer    Family history of colon cancer    Family history of lung cancer    GERD (gastroesophageal reflux disease)    not presently having symptoms   Lung cancer ([place]) 06/06/05   stage 1 poorly differentiated adenocarcinoma, s/p right lower lobectomy.   Osteoporosis    Personal history of lung cancer    STD (sexually transmitted disease)    HSV    PAST SURGICAL HISTORY: Past Surgical History:  Procedure Laterality Date   BREAST BIOPSY     BREAST LUMPECTOMY  10/12/2009   Left lumpectomy and radiation, stage II, ER/PR+, Her 2 nu negative   BUNIONECTOMY     COLONOSCOPY     LOBECTOMY  06/06/2005   RLL   MASTECTOMY W/ SENTINEL NODE BIOPSY Bilateral 03/15/2018   MASTECTOMY W/ SENTINEL NODE BIOPSY Bilateral 03/15/2018   Procedure: BILATERAL TOTAL MASTECTOMIES WITH RIGHT SENTINEL LYMPH NODE BIOPSY;  Surgeon: Rolm Bookbinder, MD;  Location: [facility];  Service: General;  Laterality: Bilateral;   PORTACATH PLACEMENT N/A 03/15/2018   Procedure: INSERTION PORT-A-CATH WITH [place];  Surgeon: Rolm Bookbinder, MD;  Location: [facility];  Service: General;  Laterality: N/A;   TONSILLECTOMY     TUBAL LIGATION  1984    FAMILY HISTORY Family History  Problem Relation Age of Onset   Allergies Mother    Asthma Mother    Lung cancer Mother    Breast cancer Mother 42       recurrence age 36   Colon cancer Father 5   Prostate cancer Brother 23   Breast cancer Sister 64       Recurrence age 4 BRCA negative   Colon polyps Sister    Leukemia Sister    Breast cancer Sister 39   Prostate cancer Brother 28   Breast cancer Maternal Grandmother 9   Colon cancer Maternal Aunt    Leukemia Maternal Grandfather      Lung cancer Maternal Aunt    Breast cancer Cousin    Esophageal cancer Neg Hx    Rectal cancer Neg Hx    Stomach cancer Neg Hx    The patient's maternal grandmother was diagnosed with cancer at age 69. Patient's mother was diagnosed at age 52. The patient's sister was diagnosed at age 6 with recurrence at 12 and is BRCA negative; she was also diagnosed with leukemia and underwent a bone marrow transplant before passing in 11/2018. A second sister was diagnosed at age 30.   GYNECOLOGIC HISTORY:  No LMP recorded. Patient is postmenopausal. Menarche age 40, the patient is GX P0. She went through the change of life in approximately age 28. She did not take hormone replacement.  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 It's just the 2 of them at home. She takes care of 41-year-old for a neighbor and is the primary caregiver for her husband who has had a couple of small strokes and has myelodysplasia. She helps a friend out with her business. She walks her 68 year old lab. 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    ADVANCED DIRECTIVES: In place   HEALTH MAINTENANCE: Social History   Tobacco Use   Smoking status: Former Smoker    Packs/day: 2.00    Years: 18.00    Pack years: 36.00    Quit date: 07/12/1987    Years since quitting: 31.4   Smokeless tobacco: Never Used  Substance Use Topics   Alcohol use: Yes    Alcohol/week: 4.0 standard drinks    Types: 4 Standard drinks or equivalent per week    Comment: social   Drug use: No    Allergies  Allergen Reactions   Taxol [Paclitaxel] Anaphylaxis   Clarithromycin Rash    Current Outpatient Medications  Medication Sig Dispense Refill   acyclovir (ZOVIRAX) 400 MG tablet TAKE 1 TABLET BY MOUTH TWICE A DAY 90 tablet 3   calcium gluconate 500 MG tablet Take 500 mg by mouth daily.     carvedilol (COREG) 6.25 MG  tablet Take 1 tablet (6.25 mg total) by mouth 2 (two) times daily. 180 tablet 3   cholecalciferol (VITAMIN D) 1000 UNITS tablet Take 1,000 Units by mouth daily.     lidocaine-prilocaine (EMLA) cream Apply 1 application topically as needed. 30 g 0   losartan (COZAAR) 25 MG tablet Take 1 tablet (25 mg total) by mouth daily. 30 tablet 3   Psyllium (METAMUCIL PO) Take 5 mLs by mouth daily.      tamoxifen (NOLVADEX) 20 MG tablet Take 20 mg by mouth daily.     triamcinolone (KENALOG) 0.025 % cream Apply 1 application topically 4 (four) times daily. (Patient not taking: Reported on 09/19/2018) 80 g 2   vitamin C (ASCORBIC ACID) 500 MG tablet Take 500 mg by mouth daily.     No current facility-administered medications for this visit.     OBJECTIVE: Middle-aged white woman in no acute distress  Vitals:   12/26/18 1148  BP: (!) 155/78  Pulse: 61  Resp: 18  Temp: 98.2 F (36.8 C)  SpO2: 100%     Body mass index is 19.03 kg/m.    ECOG FS:1 - Symptomatic but completely ambulatory  Sclerae unicteric, EOMs intact No cervical or supraclavicular adenopathy Lungs no rales or rhonchi Heart regular rate and rhythm Abd soft, nontender, positive bowel sounds MSK no focal spinal tenderness, no upper extremity lymphedema Neuro: nonfocal, well oriented, appropriate affect Breasts: Status post bilateral mastectomies with no evidence of chest wall recurrence.  Both axillae are benign.   CMP     Component Value Date/Time   NA 139 12/26/2018 1126   NA 139 12/22/2016 1141   K 4.3 12/26/2018 1126   K 4.2 12/22/2016 1141   CL 102 12/26/2018 1126   CL 103 11/22/2012 1025   CO2 27 12/26/2018 1126   CO2 28 12/22/2016 1141   GLUCOSE 100 (H) 12/26/2018 1126   GLUCOSE 92 12/22/2016 1141   GLUCOSE 94 11/22/2012 1025   BUN 16 12/26/2018 1126   BUN 13.2 12/22/2016 1141   CREATININE 0.77 12/26/2018 1126   CREATININE 0.64 11/07/2017 0906  CREATININE 0.7 12/22/2016 1141   CALCIUM 9.2 12/26/2018 1126    CALCIUM 9.7 12/22/2016 1141   PROT 7.4 12/26/2018 1126   PROT 7.0 12/22/2016 1141   ALBUMIN 3.8 12/26/2018 1126   ALBUMIN 3.8 12/22/2016 1141   AST 17 12/26/2018 1126   AST 18 12/22/2016 1141   ALT 9 12/26/2018 1126   ALT 10 12/22/2016 1141   ALKPHOS 63 12/26/2018 1126   ALKPHOS 81 12/22/2016 1141   BILITOT 0.5 12/26/2018 1126   BILITOT 0.65 12/22/2016 1141   GFRNONAA >60 12/26/2018 1126   GFRNONAA 93 11/07/2017 0906   GFRAA >60 12/26/2018 1126   GFRAA 108 11/07/2017 0906    INo results found for: SPEP, UPEP  Lab Results  Component Value Date   WBC 4.1 12/26/2018   NEUTROABS 2.3 12/26/2018   HGB 11.9 (L) 12/26/2018   HCT 37.5 12/26/2018   MCV 97.4 12/26/2018   PLT 225 12/26/2018      Chemistry      Component Value Date/Time   NA 139 12/26/2018 1126   NA 139 12/22/2016 1141   K 4.3 12/26/2018 1126   K 4.2 12/22/2016 1141   CL 102 12/26/2018 1126   CL 103 11/22/2012 1025   CO2 27 12/26/2018 1126   CO2 28 12/22/2016 1141   BUN 16 12/26/2018 1126   BUN 13.2 12/22/2016 1141   CREATININE 0.77 12/26/2018 1126   CREATININE 0.64 11/07/2017 0906   CREATININE 0.7 12/22/2016 1141      Component Value Date/Time   CALCIUM 9.2 12/26/2018 1126   CALCIUM 9.7 12/22/2016 1141   ALKPHOS 63 12/26/2018 1126   ALKPHOS 81 12/22/2016 1141   AST 17 12/26/2018 1126   AST 18 12/22/2016 1141   ALT 9 12/26/2018 1126   ALT 10 12/22/2016 1141   BILITOT 0.5 12/26/2018 1126   BILITOT 0.65 12/22/2016 1141       Lab Results  Component Value Date   LABCA2 18 09/30/2009    No components found for: [id number]  No results for input(s): INR in the last 168 hours.  Urinalysis    Component Value Date/Time   BILIRUBINUR neg 09/12/2013 1012   PROTEINUR neg 09/12/2013 1012   UROBILINOGEN negative 09/12/2013 1012   NITRITE neg 09/12/2013 1012   LEUKOCYTESUR Negative 09/12/2013 1012    STUDIES: No results found.   ASSESSMENT: 68 y.o. Anna Valentine, Anna Valentine woman status post right upper  lobe wedge resection, middle lobe wedge resection, lower lobectomy and mediastinal lymph node dissection 06/06/2005 for a 1.2 cm grade 3 adenocarcinoma, pT1 pN1, stage Ia  (a) [facility] with every other year chest CT, most recently 06/06/2017  LEFT BREAST CANCER: (1) status post left breast upper outer quadrant biopsy 09/02/2009 for an invasive lobular carcinoma (E-cadherin negative) estrogen receptor 98% positive, progesterone receptor 6% positive, with an MIB-1 of 14% and no HER-2 amplification. [[id number]]  (2) status post left lumpectomy and sentinel lymph node sampling 10/12/2009 for a pT1b pN1, stage IIA invasive lobular breast cancer, with negative margins.  (3) Oncotype DX score of 16 predicts a risk of outside the breast recurrence of 10% if the patient's only systemic treatment is tamoxifen for 5 years. It also predicts no benefit from adjuvant chemotherapy  (4) completed adjuvant radiation therapy 01/28/2010, receiving 5040 cGy to the left breast, with a boost to the upper inner aspect of the breast (to a cumulative dose of 6300 cGy); the axillary and supraclavicular regions received 4500 cGy  (5) started anastrozole July  2011, completedI 5 years July 2016  RIGHT BREAST CANCER: (6) status post right breast upper outer quadrant biopsy 01/29/2018 for a clinical  T1c N0, stage IA invasive ductal carcinoma, grade 2, estrogen receptor positive, progesterone receptor negative, HER-2 amplified, with an MIB-1 of 15%  (7) status post bilateral mastectomy with right sentinel lymph node sampling 03/15/2018, showing  (a) on the left, no malignancy noted  (b) on the right, a pT1c pN0,stage IA invasive ductal carcinoma, grade 2, with negative margins.  (c) a total of 6 lymph nodes removed from the right axilla, none from the left  (8) chemotherapy consisting of paclitaxel weekly x12 started 04/26/2018, with trastuzumab to be given for 1 year  (a) myocardial perfusion study 12/20/2017  showed an ejection fraction of 75% (hyperdynamic  (b) paclitaxel switched to Abraxane with the dose #2 because of initial reaction to Taxol  (c) Abraxane discontinued 07/13/2018 with continuing side effects  (d) received a total of 10 paclitaxel last Abraxane doses of 12 planned  (9) adjuvant radiation not indicated  (10) tamoxifen started 08/01/2018  (11) bone density 02/21/2012 at [facility] showed osteoporosis with a T score of -2.5; on alendronate  (12) repeat genetics testing 02/08/2018 offered through [facility] Common Hereditary Cancers Panel and 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 There were no deleterious mutations in: APC, ATM, AXIN2, BARD1, BMPR1A, BRCA1, BRCA2, BRIP1, CDH1, CDKN2A (p14ARF), CDKN2A (p16INK4a), CKD4, CHEK2, CTNNA1, DICER1, EPCAM (Deletion/duplication testing only), GREM1 (promoter region deletion/duplication testing only), KIT, MEN1, MLH1, MSH2, MSH3, MSH6, MUTYH, NBN, NF1, NHTL1, PALB2, PDGFRA, PMS2, POLD1, POLE, PTEN, RAD50, RAD51C, RAD51D, SDHB, SDHC, SDHD, SMAD4, SMARCA4. STK11, TSC1, TSC2, and VHL.  The following genes were evaluated for sequence changes only: SDHA and HOXB13 c.251G>A variant only.  (a) see "cancer surveillance" for details of long term Maylon Peppers related screening studies   (i) s/p bilateral mastectomies   (ii) colonoscopy recommended every 2 to 5 years: Next scheduled September 2020   (iii) brain MRI every year: However patient has significant claustrophobia   (iiii) consider referral to a Li-Fraumeni syndrome clinic  PLAN: Anna Valentine is now just short of a year out from definitive surgery for her breast cancer with no evidence of disease recurrence.  This is very favorable.  She is tolerating the tamoxifen well and the plan will be to continue that a minimum of 5 years.  We had referred her to the Li-Fraumeni clinic in [place] but with the pandemic that has not happened.  I was sorry to learn of her sister's death  from leukemia.  Her sister also had Li-Fraumeni syndrome.  We discussed screening studies today.  She is going to have a brain CT scan since she cannot undergo MRI because of claustrophobia, and she will have a PET scan.  Assuming those are negative we will pull her port  She is already scheduled in September for repeat colonoscopy  She will see me again in October.  She knows to call for any other issues that may develop before that visit.  Anel Creighton, Virgie Dad, MD  12/26/18 12:25 PM Medical Oncology and Hematology [facility] [address] [place], [place] [address] Tel. [phone]    Fax. [phone]   I, Jacqualyn Posey am acting as a Education administrator for Chauncey Cruel, MD.   I, Lurline Del MD, have reviewed the above documentation for accuracy and completeness, and I agree with the above.

## 2018-12-26 ENCOUNTER — Other Ambulatory Visit: Payer: Self-pay

## 2018-12-26 ENCOUNTER — Encounter: Payer: Self-pay | Admitting: Oncology

## 2018-12-26 ENCOUNTER — Inpatient Hospital Stay: Payer: Medicare Other

## 2018-12-26 ENCOUNTER — Inpatient Hospital Stay: Payer: Medicare Other | Attending: Oncology | Admitting: Oncology

## 2018-12-26 VITALS — BP 155/78 | HR 61 | Temp 98.2°F | Resp 18 | Ht 66.0 in | Wt 117.9 lb

## 2018-12-26 DIAGNOSIS — Z87891 Personal history of nicotine dependence: Secondary | ICD-10-CM | POA: Insufficient documentation

## 2018-12-26 DIAGNOSIS — Z79810 Long term (current) use of selective estrogen receptor modulators (SERMs): Secondary | ICD-10-CM | POA: Diagnosis not present

## 2018-12-26 DIAGNOSIS — Z85118 Personal history of other malignant neoplasm of bronchus and lung: Secondary | ICD-10-CM | POA: Diagnosis not present

## 2018-12-26 DIAGNOSIS — Z79899 Other long term (current) drug therapy: Secondary | ICD-10-CM | POA: Insufficient documentation

## 2018-12-26 DIAGNOSIS — Z923 Personal history of irradiation: Secondary | ICD-10-CM | POA: Insufficient documentation

## 2018-12-26 DIAGNOSIS — C50212 Malignant neoplasm of upper-inner quadrant of left female breast: Secondary | ICD-10-CM | POA: Insufficient documentation

## 2018-12-26 DIAGNOSIS — Z170 Estrogen receptor positive status [ER+]: Secondary | ICD-10-CM | POA: Diagnosis not present

## 2018-12-26 DIAGNOSIS — Z1501 Genetic susceptibility to malignant neoplasm of breast: Secondary | ICD-10-CM | POA: Insufficient documentation

## 2018-12-26 DIAGNOSIS — J449 Chronic obstructive pulmonary disease, unspecified: Secondary | ICD-10-CM | POA: Diagnosis not present

## 2018-12-26 DIAGNOSIS — R232 Flushing: Secondary | ICD-10-CM | POA: Insufficient documentation

## 2018-12-26 DIAGNOSIS — C3431 Malignant neoplasm of lower lobe, right bronchus or lung: Secondary | ICD-10-CM

## 2018-12-26 LAB — CBC WITH DIFFERENTIAL/PLATELET
Abs Immature Granulocytes: 0.03 10*3/uL (ref 0.00–0.07)
Basophils Absolute: 0 10*3/uL (ref 0.0–0.1)
Basophils Relative: 1 %
Eosinophils Absolute: 0.1 10*3/uL (ref 0.0–0.5)
Eosinophils Relative: 2 %
HCT: 37.5 % (ref 36.0–46.0)
Hemoglobin: 11.9 g/dL — ABNORMAL LOW (ref 12.0–15.0)
Immature Granulocytes: 1 %
Lymphocytes Relative: 28 %
Lymphs Abs: 1.2 10*3/uL (ref 0.7–4.0)
MCH: 30.9 pg (ref 26.0–34.0)
MCHC: 31.7 g/dL (ref 30.0–36.0)
MCV: 97.4 fL (ref 80.0–100.0)
Monocytes Absolute: 0.5 10*3/uL (ref 0.1–1.0)
Monocytes Relative: 12 %
Neutro Abs: 2.3 10*3/uL (ref 1.7–7.7)
Neutrophils Relative %: 56 %
Platelets: 225 10*3/uL (ref 150–400)
RBC: 3.85 MIL/uL — ABNORMAL LOW (ref 3.87–5.11)
RDW: 12.6 % (ref 11.5–15.5)
WBC: 4.1 10*3/uL (ref 4.0–10.5)
nRBC: 0 % (ref 0.0–0.2)

## 2018-12-26 LAB — COMPREHENSIVE METABOLIC PANEL
ALT: 9 U/L (ref 0–44)
AST: 17 U/L (ref 15–41)
Albumin: 3.8 g/dL (ref 3.5–5.0)
Alkaline Phosphatase: 63 U/L (ref 38–126)
Anion gap: 10 (ref 5–15)
BUN: 16 mg/dL (ref 8–23)
CO2: 27 mmol/L (ref 22–32)
Calcium: 9.2 mg/dL (ref 8.9–10.3)
Chloride: 102 mmol/L (ref 98–111)
Creatinine, Ser: 0.77 mg/dL (ref 0.44–1.00)
GFR calc Af Amer: >60 mL/min (ref >60)
GFR calc non Af Amer: >60 mL/min (ref >60)
Glucose, Bld: 100 mg/dL — ABNORMAL HIGH (ref 70–99)
Potassium: 4.3 mmol/L (ref 3.5–5.1)
Sodium: 139 mmol/L (ref 135–145)
Total Bilirubin: 0.5 mg/dL (ref 0.3–1.2)
Total Protein: 7.4 g/dL (ref 6.5–8.1)

## 2018-12-28 ENCOUNTER — Other Ambulatory Visit: Payer: Self-pay

## 2018-12-28 ENCOUNTER — Ambulatory Visit (HOSPITAL_COMMUNITY)
Admission: RE | Admit: 2018-12-28 | Discharge: 2018-12-28 | Disposition: A | Discharge status: Home / Self Care | Payer: Medicare Other | Source: Ambulatory Visit | Attending: Cardiology | Admitting: Cardiology

## 2018-12-28 DIAGNOSIS — C50212 Malignant neoplasm of upper-inner quadrant of left female breast: Secondary | ICD-10-CM | POA: Diagnosis not present

## 2018-12-28 DIAGNOSIS — Z170 Estrogen receptor positive status [ER+]: Secondary | ICD-10-CM | POA: Diagnosis not present

## 2018-12-28 NOTE — Progress Notes (Signed)
  Echocardiogram 2D Echocardiogram has been performed.  Oneal Deputy Lorea Kupfer 12/28/2018, 10:55 AM

## 2018-12-30 ENCOUNTER — Encounter: Payer: Self-pay | Admitting: Oncology

## 2018-12-30 ENCOUNTER — Other Ambulatory Visit: Payer: Self-pay | Admitting: Oncology

## 2018-12-31 ENCOUNTER — Telehealth (HOSPITAL_COMMUNITY): Payer: Self-pay

## 2018-12-31 NOTE — Telephone Encounter (Signed)
LVM to return call.

## 2018-12-31 NOTE — Telephone Encounter (Signed)
-----   Message from Larey Dresser, MD sent at 12/29/2018  9:48 PM EDT ----- EF down to 45-50%.  Need to put Herceptin on hold for now and start losartan 25 mg daily with BMET in 10 days.  Needs followup in the office to discuss, Linus Orn or Anderson Malta please arrange.

## 2019-01-01 NOTE — Telephone Encounter (Signed)
-----   Message from Larey Dresser, MD sent at 12/29/2018  9:48 PM EDT ----- EF down to 45-50%.  Need to put Herceptin on hold for now and start losartan 25 mg daily with BMET in 10 days.  Needs followup in the office to discuss, Linus Orn or Anderson Malta please arrange.

## 2019-01-01 NOTE — Telephone Encounter (Signed)
Relayed message to pt, Herceptin has been on hold since last Echo and is already taking losartan 25 mg daily. Routed to [facility] to schedule OV, will draw BMET during visit.

## 2019-01-02 ENCOUNTER — Encounter (HOSPITAL_COMMUNITY): Payer: Self-pay

## 2019-01-02 ENCOUNTER — Other Ambulatory Visit: Payer: Self-pay | Admitting: Oncology

## 2019-01-02 ENCOUNTER — Ambulatory Visit (HOSPITAL_COMMUNITY)
Admission: RE | Admit: 2019-01-02 | Discharge: 2019-01-02 | Disposition: A | Discharge status: Home / Self Care | Payer: Medicare Other | Source: Ambulatory Visit | Attending: Oncology | Admitting: Oncology

## 2019-01-02 ENCOUNTER — Ambulatory Visit (HOSPITAL_COMMUNITY): Admission: RE | Admit: 2019-01-02 | Payer: Medicare Other | Source: Ambulatory Visit

## 2019-01-02 ENCOUNTER — Ambulatory Visit (HOSPITAL_COMMUNITY): Payer: Medicare Other

## 2019-01-02 ENCOUNTER — Other Ambulatory Visit: Payer: Self-pay

## 2019-01-02 DIAGNOSIS — C50212 Malignant neoplasm of upper-inner quadrant of left female breast: Secondary | ICD-10-CM

## 2019-01-02 DIAGNOSIS — Z170 Estrogen receptor positive status [ER+]: Secondary | ICD-10-CM | POA: Diagnosis not present

## 2019-01-02 DIAGNOSIS — Z1501 Genetic susceptibility to malignant neoplasm of breast: Secondary | ICD-10-CM

## 2019-01-02 DIAGNOSIS — C50912 Malignant neoplasm of unspecified site of left female breast: Secondary | ICD-10-CM | POA: Diagnosis not present

## 2019-01-02 DIAGNOSIS — C3491 Malignant neoplasm of unspecified part of right bronchus or lung: Secondary | ICD-10-CM | POA: Diagnosis not present

## 2019-01-02 DIAGNOSIS — Z853 Personal history of malignant neoplasm of breast: Secondary | ICD-10-CM | POA: Diagnosis not present

## 2019-01-02 DIAGNOSIS — Z902 Acquired absence of lung [part of]: Secondary | ICD-10-CM | POA: Insufficient documentation

## 2019-01-02 DIAGNOSIS — C3431 Malignant neoplasm of lower lobe, right bronchus or lung: Secondary | ICD-10-CM | POA: Insufficient documentation

## 2019-01-02 DIAGNOSIS — Z08 Encounter for follow-up examination after completed treatment for malignant neoplasm: Secondary | ICD-10-CM | POA: Diagnosis not present

## 2019-01-02 DIAGNOSIS — Z85118 Personal history of other malignant neoplasm of bronchus and lung: Secondary | ICD-10-CM | POA: Diagnosis not present

## 2019-01-02 DIAGNOSIS — C50911 Malignant neoplasm of unspecified site of right female breast: Secondary | ICD-10-CM | POA: Diagnosis not present

## 2019-01-02 LAB — GLUCOSE, CAPILLARY: Glucose-Capillary: 83 mg/dL (ref 70–99)

## 2019-01-02 MED ORDER — SODIUM CHLORIDE (PF) 0.9 % IJ SOLN
INTRAMUSCULAR | Status: AC
  Filled 2019-01-02: qty 50

## 2019-01-02 MED ORDER — FLUDEOXYGLUCOSE F - 18 (FDG) INJECTION
5.8600 | Freq: Once | INTRAVENOUS | Status: AC | PRN
  Administered 2019-01-02: 5.86 via INTRAVENOUS

## 2019-01-02 MED ORDER — IOHEXOL 300 MG/ML  SOLN
75.0000 mL | Freq: Once | INTRAMUSCULAR | Status: AC | PRN
  Administered 2019-01-02: 75 mL via INTRAVENOUS

## 2019-01-04 ENCOUNTER — Encounter: Payer: Self-pay | Admitting: *Deleted

## 2019-01-16 ENCOUNTER — Other Ambulatory Visit: Payer: Medicare Other

## 2019-01-16 ENCOUNTER — Ambulatory Visit: Payer: Medicare Other

## 2019-01-16 ENCOUNTER — Other Ambulatory Visit: Payer: Self-pay

## 2019-01-17 ENCOUNTER — Other Ambulatory Visit: Payer: Self-pay

## 2019-01-17 ENCOUNTER — Encounter (HOSPITAL_COMMUNITY): Payer: Medicare Other | Admitting: Cardiology

## 2019-01-17 ENCOUNTER — Encounter (HOSPITAL_COMMUNITY): Payer: Self-pay | Admitting: Cardiology

## 2019-01-17 ENCOUNTER — Ambulatory Visit (HOSPITAL_COMMUNITY)
Admission: RE | Admit: 2019-01-17 | Discharge: 2019-01-17 | Disposition: A | Discharge status: Home / Self Care | Payer: Medicare Other | Source: Ambulatory Visit | Attending: Cardiology | Admitting: Cardiology

## 2019-01-17 VITALS — BP 123/79 | HR 62 | Wt 118.0 lb

## 2019-01-17 DIAGNOSIS — Z902 Acquired absence of lung [part of]: Secondary | ICD-10-CM | POA: Insufficient documentation

## 2019-01-17 DIAGNOSIS — Z853 Personal history of malignant neoplasm of breast: Secondary | ICD-10-CM | POA: Diagnosis not present

## 2019-01-17 DIAGNOSIS — Z79899 Other long term (current) drug therapy: Secondary | ICD-10-CM | POA: Diagnosis not present

## 2019-01-17 DIAGNOSIS — Z9012 Acquired absence of left breast and nipple: Secondary | ICD-10-CM | POA: Diagnosis not present

## 2019-01-17 DIAGNOSIS — Z825 Family history of asthma and other chronic lower respiratory diseases: Secondary | ICD-10-CM | POA: Insufficient documentation

## 2019-01-17 DIAGNOSIS — Z85118 Personal history of other malignant neoplasm of bronchus and lung: Secondary | ICD-10-CM | POA: Insufficient documentation

## 2019-01-17 DIAGNOSIS — Z8042 Family history of malignant neoplasm of prostate: Secondary | ICD-10-CM | POA: Insufficient documentation

## 2019-01-17 DIAGNOSIS — E7800 Pure hypercholesterolemia, unspecified: Secondary | ICD-10-CM | POA: Diagnosis not present

## 2019-01-17 DIAGNOSIS — Z803 Family history of malignant neoplasm of breast: Secondary | ICD-10-CM | POA: Diagnosis not present

## 2019-01-17 DIAGNOSIS — Z800 Family history of malignant neoplasm of digestive organs: Secondary | ICD-10-CM | POA: Insufficient documentation

## 2019-01-17 DIAGNOSIS — I5022 Chronic systolic (congestive) heart failure: Secondary | ICD-10-CM

## 2019-01-17 DIAGNOSIS — I2510 Atherosclerotic heart disease of native coronary artery without angina pectoris: Secondary | ICD-10-CM | POA: Insufficient documentation

## 2019-01-17 DIAGNOSIS — Z801 Family history of malignant neoplasm of trachea, bronchus and lung: Secondary | ICD-10-CM | POA: Insufficient documentation

## 2019-01-17 DIAGNOSIS — J449 Chronic obstructive pulmonary disease, unspecified: Secondary | ICD-10-CM | POA: Diagnosis not present

## 2019-01-17 DIAGNOSIS — Z806 Family history of leukemia: Secondary | ICD-10-CM | POA: Diagnosis not present

## 2019-01-17 DIAGNOSIS — I272 Pulmonary hypertension, unspecified: Secondary | ICD-10-CM | POA: Diagnosis not present

## 2019-01-17 DIAGNOSIS — Z87891 Personal history of nicotine dependence: Secondary | ICD-10-CM | POA: Diagnosis not present

## 2019-01-17 MED ORDER — LOSARTAN POTASSIUM 25 MG PO TABS: 25.0000 mg | ORAL_TABLET | Freq: Two times a day (BID) | ORAL | 6 refills | Status: DC

## 2019-01-17 NOTE — Progress Notes (Signed)
Oncology: Dr. Magrinat  68 y.o. with history of COPD, prior lung cancer, and breast cancer was referred by Dr. Magrinat for cardio-oncology evaluation given use of Herceptin. Patient had breast cancer diagnosed on left in 2/11, ER+/PR+/HER2-.  She had left lumpectomy and radiation.  Diagnosed in 7/19 with right breast cancer, ER+/PR-/HER2+.  She had bilateral mastectomy.  Plan initially for paclitaxel x 12 cycles and Herceptin x 1 year.  Intolerant of paclitaxel, switched to abraxane.    She had atypical chest pain in 6/19, Cardiolite at that time showed no ischemia.  Other than that incident, no cardiac history.  She is a prior smoker but has quit.  No exertional dyspnea or chest pain.    Echo in 3/20 showed significantly less negative strain, and Herceptin was held.  She was started on Coreg.  Repeat echo in 4/20 showed EF down to 50-55%, losartan was added.  Repeat echo in 6/20 showed EF lower again at 45-50%.  She remains off Herceptin.   Labs (6/20): K 4.3, creatinine 0.77  PMH: 1. Bronchiectasis.   2. Lung cancer: Adenocarcinoma.  In 11/06, had RUL wedge resection, right middle lobe wedge resection, and right lower lobectomy.  3. Breast cancer: Diagnosed on left in 2/11, ER+/PR+/HER2-.  She had left lumpectomy and radiation.  Diagnosed in 7/19 with right breast cancer, ER+/PR-/HER2+.  She had bilateral mastectomy.  Plan initially for paclitaxel x 12 cycles and Herceptin x 1 year.  Intolerant of paclitaxel, switched to abraxane.   - Echo (12/19): EF 55-60%, GLS -19%, normal RV size and systolic function, PASP 48 mmHg - Echo (3/20): EF 55%, GLS -13.3%. Normal RV size and systolic function.  - Echo (4/20): EF 50-55%, grade 2 diastolic dysfunction, RV normal. - Echo (6/20): EF 45-50%, grade 2 diastolic dysfunction, normal RV, GLS -17.7%.  4. Chest pain: Cardiolite (6/19) with EF 75%, no ischemia.   Social History   Socioeconomic History  . Marital status: Married    Spouse name: Not on file   . Number of children: 0  . Years of education: Not on file  . Highest education level: Not on file  Occupational History  . Occupation: OFFICE MANAGER    Employer: [employer]  Social Needs  . Financial resource strain: Not on file  . Food insecurity    Worry: Not on file    Inability: Not on file  . Transportation needs    Medical: Not on file    Non-medical: Not on file  Tobacco Use  . Smoking status: Former Smoker    Packs/day: 2.00    Years: 18.00    Pack years: 36.00    Quit date: 07/12/1987    Years since quitting: 31.5  . Smokeless tobacco: Never Used  Substance and Sexual Activity  . Alcohol use: Yes    Alcohol/week: 4.0 standard drinks    Types: 4 Standard drinks or equivalent per week    Comment: social  . Drug use: No  . Sexual activity: Yes  Lifestyle  . Physical activity    Days per week: Not on file    Minutes per session: Not on file  . Stress: Not on file  Relationships  . Social connections    Talks on phone: Not on file    Gets together: Not on file    Attends religious service: Not on file    Active member of club or organization: Not on file    Attends meetings of clubs or organizations: Not on file      Relationship status: Not on file  . Intimate partner violence    Fear of current or ex partner: Not on file    Emotionally abused: Not on file    Physically abused: Not on file    Forced sexual activity: Not on file  Other Topics Concern  . Not on file  Social History Narrative  . Not on file   Family History  Problem Relation Age of Onset  . Allergies Mother   . Asthma Mother   . Lung cancer Mother   . Breast cancer Mother 75       recurrence age 1  . Colon cancer Father 49  . Prostate cancer Brother 16  . Breast cancer Sister 7       Recurrence age 51 BRCA negative  . Colon polyps Sister   . Leukemia Sister   . Breast cancer Sister 11  . Prostate cancer Brother 57  . Breast cancer Maternal Grandmother 50  . Colon cancer  Maternal Aunt   . Leukemia Maternal Grandfather   . Lung cancer Maternal Aunt   . Breast cancer Cousin   . Esophageal cancer Neg Hx   . Rectal cancer Neg Hx   . Stomach cancer Neg Hx    ROS: All systems reviewed and negative except as per HPI.   Current Outpatient Medications  Medication Sig Dispense Refill  . acyclovir (ZOVIRAX) 400 MG tablet TAKE 1 TABLET BY MOUTH TWICE A DAY 90 tablet 3  . calcium gluconate 500 MG tablet Take 500 mg by mouth daily.    . carvedilol (COREG) 6.25 MG tablet Take 1 tablet (6.25 mg total) by mouth 2 (two) times daily. 180 tablet 3  . cholecalciferol (VITAMIN D) 1000 UNITS tablet Take 1,000 Units by mouth daily.    Marland Kitchen losartan (COZAAR) 25 MG tablet Take 1 tablet (25 mg total) by mouth 2 (two) times a day. 60 tablet 6  . Psyllium (METAMUCIL PO) Take 5 mLs by mouth daily.     . vitamin C (ASCORBIC ACID) 500 MG tablet Take 500 mg by mouth daily.    Marland Kitchen lidocaine-prilocaine (EMLA) cream Apply 1 application topically as needed. (Patient not taking: Reported on 01/17/2019) 30 g 0  . tamoxifen (NOLVADEX) 20 MG tablet Take 20 mg by mouth daily.    Marland Kitchen triamcinolone (KENALOG) 0.025 % cream Apply 1 application topically 4 (four) times daily. (Patient not taking: Reported on 09/19/2018) 80 g 2   No current facility-administered medications for this encounter.    BP 123/79   Pulse 62   Wt 53.5 kg (118 lb)   SpO2 97%   BMI 19.05 kg/m  General: NAD Neck: No JVD, no thyromegaly or thyroid nodule.  Lungs: Clear to auscultation bilaterally with normal respiratory effort. CV: Nondisplaced PMI.  Heart regular S1/S2, no S3/S4, no murmur.  No peripheral edema.  No carotid bruit.  Normal pedal pulses.  Abdomen: Soft, nontender, no hepatosplenomegaly, no distention.  Skin: Intact without lesions or rashes.  Neurologic: Alert and oriented x 3.  Psych: Normal affect. Extremities: No clubbing or cyanosis.  HEENT: Normal.   Assessment/Plan: 1. Breast cancer: I had concern in  3/20 for cardiotoxicity from Herceptin, and it has been stopped.  Most recent echo in 6/20 showed EF down to 45-50%.   - Continue Coreg 6.25 mg bid.  - Increase losartan to 25 mg bid, BMET 2 wks.  - She says that Dr. Jana Hakim thinks that the Herceptin course that she had prior to 3/20  has been adequate and she would not need to start back on it. Therefore, she will stay off Herceptin for the time being.  - I will repeat echo in 6 wks.  If EF remains low, I will likely arrange for coronary CTA to make sure there is no evidence for obstructive coronary disease.   2. Pulmonary hypertension: By prior echo, PASP 48 mmHg.  She has a history of bronchiectasis and also of extensive right lung resections for lung cancer.  Suspect group 3 pulmonary hypertension from lung disease.  3. CAD: Coronary calcification noted on prior CT.  She really is not interested in starting a statin.  However, I will check lipids today and may check with her again about this if LDL is significantly elevated.   Dalton McLean 01/17/2019  

## 2019-01-17 NOTE — Patient Instructions (Signed)
INCREASE Losartan to 25mg  twice a day  Labs in 2 weeks We will only contact you if something comes back abnormal or we need to make some changes. Otherwise no news is good news!  Your physician has requested that you have an echocardiogram. Echocardiography is a painless test that uses sound waves to create images of your heart. It provides your doctor with information about the size and shape of your heart and how well your heart's chambers and valves are working. This procedure takes approximately one hour. There are no restrictions for this procedure.  Your physician recommends that you schedule a follow-up appointment in: 6 weeks for an ECHO and follow up with Dr Aundra Dubin.   At the [facility], you and your health needs are our priority. As part of our continuing mission to provide you with exceptional heart care, we have created designated Provider Care Teams. These Care Teams include your primary Cardiologist (physician) and Advanced Practice Providers (APPs- Physician Assistants and Nurse Practitioners) who all work together to provide you with the care you need, when you need it.   You may see any of the following providers on your designated Care Team at your next follow up: Marland Kitchen Dr Glori Bickers . Dr Loralie Champagne . Darrick Grinder, NP

## 2019-01-18 ENCOUNTER — Ambulatory Visit
Admission: RE | Admit: 2019-01-18 | Discharge: 2019-01-18 | Disposition: A | Discharge status: Home / Self Care | Payer: Self-pay | Source: Ambulatory Visit | Attending: Oncology | Admitting: Oncology

## 2019-01-18 ENCOUNTER — Telehealth: Payer: Self-pay | Admitting: *Deleted

## 2019-01-18 ENCOUNTER — Other Ambulatory Visit: Payer: Self-pay | Admitting: *Deleted

## 2019-01-18 DIAGNOSIS — Z1501 Genetic susceptibility to malignant neoplasm of breast: Secondary | ICD-10-CM

## 2019-01-18 DIAGNOSIS — R942 Abnormal results of pulmonary function studies: Secondary | ICD-10-CM

## 2019-01-18 NOTE — Telephone Encounter (Signed)
This RN spoke with pt per her call to follow up on addendum comparison of recent PET to films done at [facility] more recently ( noted abnormality with comparison to PET performed 2006 locally ).  Note scans done at [facility] shows areas of abnormality and need comparison for definitive reading of new or progression of known areas of concern.  This RN called to [facility] radiology department and was transferred to [facility] radiology - discussed above including prior communication and verified films were put in [employer]/powershare - earlier this week.  Informed " there is a code from IT that is not letting it get pushed thru and I need to transfer you to them "  Call transferred- obtained VM - message left to return call.  Call returned from [facility]- she located outside films and stated order needed for them to be " pushed thru "- she states Tedra Coupe from [facility] normally does this Tedra Coupe is off today ).  This RN entered orders and Lonn Georgia will follow up with request for comparison reading.  Of note this RN has been working on obtaining comparison reading since 01/07/2019 - including calls to [facility] radiology, [facility] and [facility].  This RN will continue to follow for outcome for addendum comparison.

## 2019-01-18 NOTE — Progress Notes (Signed)
outide

## 2019-01-21 ENCOUNTER — Other Ambulatory Visit: Payer: Self-pay | Admitting: Oncology

## 2019-01-21 NOTE — Progress Notes (Signed)
I called Anna Valentine with the results of the PET scan now that we have a comparison from the [facility] scans obtained November 2019.  There are at least 3 clearly new lung nodules which have developed since that time.  They are sufficiently hot on PET that they could well be metastatic breast cancer or they could be new lung cancers or they could be infectious.  She would like to know what her pulmonary doctors think about this.  She will call [place] pulmonologist and she will also meet with Dr. Lamonte Sakai here.  If she decides to do a biopsy which is what I recommended she will let us know and I will be glad to set that up for her  Otherwise the plan would be to repeat this test at some short interval.

## 2019-01-25 NOTE — Telephone Encounter (Signed)
E-mail forwarded to Dwight to make him aware of patient's recent PET scan.  Results are in epic.  Patient has upcoming appt with Kenney Houseman NP on 7.28.2020  Will forward e-mail to [place] and Kenney Houseman NP

## 2019-01-25 NOTE — Telephone Encounter (Signed)
I reviewed the patient's PET scans, CTs.  Reviewed them with her.  I agree that she likely needs navigational bronchoscopy to sample the new hypermetabolic pulmonary nodules.  She is at risk for both atypical mycobacterial disease but more importantly malignancy given her history of breast cancer, lung cancer, p53 status.  I discussed ENB with her, explained the risks, benefits.  She is going to think about this, talk to family and then let me know whether she would like to proceed.  She will either call me or review with TN at their visit on 7/28  If we decide to proceed with navigational bronchoscopy then she will need a super D CT scan no contrast.

## 2019-01-28 ENCOUNTER — Other Ambulatory Visit: Payer: Self-pay

## 2019-01-28 ENCOUNTER — Telehealth: Payer: Self-pay | Admitting: Emergency Medicine

## 2019-01-28 ENCOUNTER — Other Ambulatory Visit: Payer: Medicare Other | Admitting: Internal Medicine

## 2019-01-28 DIAGNOSIS — Z1329 Encounter for screening for other suspected endocrine disorder: Secondary | ICD-10-CM

## 2019-01-28 DIAGNOSIS — M8580 Other specified disorders of bone density and structure, unspecified site: Secondary | ICD-10-CM

## 2019-01-28 DIAGNOSIS — E785 Hyperlipidemia, unspecified: Secondary | ICD-10-CM

## 2019-01-28 DIAGNOSIS — Z0000 Encounter for general adult medical examination without abnormal findings: Secondary | ICD-10-CM

## 2019-01-28 DIAGNOSIS — R911 Solitary pulmonary nodule: Secondary | ICD-10-CM

## 2019-01-28 NOTE — Telephone Encounter (Signed)
Call returned to patient, she states she spoke with Dr. Lamonte Sakai Friday and she was told to call back with her decision. She states she is ready to move ahead with the Bronch. I made her aware per Dr. Agustina Caroli note she will need a Super D CT prior. Voiced understanding. I made the patient aware I would touch bases with Dr. Lamonte Sakai first regarding the dates before placing the orders for the Chest CT. Patient aware RB will be in clinic tomorrow.   RB please advise of dates for Bronch and I can go ahead and place orders for Super D as well as Bronch. Thanks.

## 2019-01-29 LAB — CBC WITH DIFFERENTIAL/PLATELET
Absolute Monocytes: 368 cells/uL (ref 200–950)
Basophils Absolute: 8 cells/uL (ref 0–200)
Basophils Relative: 0.2 %
Eosinophils Absolute: 88 cells/uL (ref 15–500)
Eosinophils Relative: 2.2 %
HCT: 36 % (ref 35.0–45.0)
Hemoglobin: 11.8 g/dL (ref 11.7–15.5)
Lymphs Abs: 984 cells/uL (ref 850–3900)
MCH: 31.5 pg (ref 27.0–33.0)
MCHC: 32.8 g/dL (ref 32.0–36.0)
MCV: 96 fL (ref 80.0–100.0)
MPV: 9.6 fL (ref 7.5–12.5)
Monocytes Relative: 9.2 %
Neutro Abs: 2552 cells/uL (ref 1500–7800)
Neutrophils Relative %: 63.8 %
Platelets: 234 10*3/uL (ref 140–400)
RBC: 3.75 10*6/uL — ABNORMAL LOW (ref 3.80–5.10)
RDW: 11.8 % (ref 11.0–15.0)
Total Lymphocyte: 24.6 %
WBC: 4 10*3/uL (ref 3.8–10.8)

## 2019-01-29 LAB — LIPID PANEL
Cholesterol: 191 mg/dL (ref <200)
HDL: 71 mg/dL (ref >=50)
LDL Cholesterol (Calc): 105 mg/dL (calc) — ABNORMAL HIGH
Non-HDL Cholesterol (Calc): 120 mg/dL (calc) (ref <130)
Total CHOL/HDL Ratio: 2.7 (calc) (ref <5.0)
Triglycerides: 68 mg/dL (ref <150)

## 2019-01-29 LAB — COMPLETE METABOLIC PANEL WITH GFR
AG Ratio: 1.4 (calc) (ref 1.0–2.5)
ALT: 7 U/L (ref 6–29)
AST: 15 U/L (ref 10–35)
Albumin: 3.9 g/dL (ref 3.6–5.1)
Alkaline phosphatase (APISO): 55 U/L (ref 37–153)
BUN: 15 mg/dL (ref 7–25)
CO2: 28 mmol/L (ref 20–32)
Calcium: 9.2 mg/dL (ref 8.6–10.4)
Chloride: 105 mmol/L (ref 98–110)
Creat: 0.67 mg/dL (ref 0.50–0.99)
GFR, Est African American: 105 mL/min/{1.73_m2} (ref >=60)
GFR, Est Non African American: 91 mL/min/{1.73_m2} (ref >=60)
Globulin: 2.8 g/dL (calc) (ref 1.9–3.7)
Glucose, Bld: 91 mg/dL (ref 65–99)
Potassium: 4.3 mmol/L (ref 3.5–5.3)
Sodium: 141 mmol/L (ref 135–146)
Total Bilirubin: 0.4 mg/dL (ref 0.2–1.2)
Total Protein: 6.7 g/dL (ref 6.1–8.1)

## 2019-01-29 LAB — TSH: TSH: 2.23 mIU/L (ref 0.40–4.50)

## 2019-01-29 NOTE — Telephone Encounter (Signed)
Dr Lamonte Sakai - Juluis Rainier  ENB is scheduled for 02/06/2019 at 0930 at [facility] D CT chest is scheduled for 02/01/2019 at 1530, disc will be forwarded to our office once completed. Pt is aware per [facility].   Will forward to Dr. Lamonte Sakai.

## 2019-01-29 NOTE — Telephone Encounter (Signed)
Thank you :)

## 2019-01-29 NOTE — Telephone Encounter (Signed)
Ok ENB@[facility]  OR 7/2*/20@9 :30AM SUPER D CHEST CT@[facility]  02/01/19@3 :30PM if this is ok I will call the pt Anna Valentine

## 2019-01-29 NOTE — Telephone Encounter (Signed)
Thanks

## 2019-01-29 NOTE — Telephone Encounter (Signed)
We should try to arrange ENB on 02/06/2019 at [facility], send message / referral to [facility]. OK to go ahead and get SuperD Ct chest, no contrast in preparation.

## 2019-01-29 NOTE — Telephone Encounter (Signed)
Order placed for ENB and CT chest, super D.   Will forward to PCCs to schedule.

## 2019-01-31 ENCOUNTER — Other Ambulatory Visit: Payer: Medicare Other

## 2019-02-01 ENCOUNTER — Ambulatory Visit (HOSPITAL_COMMUNITY)
Admission: RE | Admit: 2019-02-01 | Discharge: 2019-02-01 | Disposition: A | Discharge status: Home / Self Care | Payer: Medicare Other | Source: Ambulatory Visit | Attending: Emergency Medicine | Admitting: Emergency Medicine

## 2019-02-01 ENCOUNTER — Other Ambulatory Visit: Payer: Self-pay

## 2019-02-01 DIAGNOSIS — J984 Other disorders of lung: Secondary | ICD-10-CM | POA: Diagnosis not present

## 2019-02-01 DIAGNOSIS — I700 Atherosclerosis of aorta: Secondary | ICD-10-CM | POA: Diagnosis not present

## 2019-02-01 DIAGNOSIS — R911 Solitary pulmonary nodule: Secondary | ICD-10-CM | POA: Diagnosis not present

## 2019-02-02 ENCOUNTER — Other Ambulatory Visit (HOSPITAL_COMMUNITY)
Admission: RE | Admit: 2019-02-02 | Discharge: 2019-02-02 | Disposition: A | Discharge status: Home / Self Care | Payer: Medicare Other | Source: Ambulatory Visit | Attending: Emergency Medicine | Admitting: Emergency Medicine

## 2019-02-02 DIAGNOSIS — Z1159 Encounter for screening for other viral diseases: Secondary | ICD-10-CM | POA: Diagnosis not present

## 2019-02-02 LAB — SARS CORONAVIRUS 2 (TAT 6-24 HRS): SARS Coronavirus 2: NEGATIVE

## 2019-02-04 ENCOUNTER — Encounter: Payer: Self-pay | Admitting: Internal Medicine

## 2019-02-04 ENCOUNTER — Ambulatory Visit (INDEPENDENT_AMBULATORY_CARE_PROVIDER_SITE_OTHER): Payer: Medicare Other | Admitting: Internal Medicine

## 2019-02-04 ENCOUNTER — Other Ambulatory Visit: Payer: Self-pay

## 2019-02-04 VITALS — BP 140/80 | Ht 65.0 in | Wt 117.0 lb

## 2019-02-04 DIAGNOSIS — Z0000 Encounter for general adult medical examination without abnormal findings: Secondary | ICD-10-CM

## 2019-02-04 DIAGNOSIS — Z853 Personal history of malignant neoplasm of breast: Secondary | ICD-10-CM | POA: Diagnosis not present

## 2019-02-04 DIAGNOSIS — J479 Bronchiectasis, uncomplicated: Secondary | ICD-10-CM

## 2019-02-04 DIAGNOSIS — R918 Other nonspecific abnormal finding of lung field: Secondary | ICD-10-CM

## 2019-02-04 DIAGNOSIS — M8580 Other specified disorders of bone density and structure, unspecified site: Secondary | ICD-10-CM

## 2019-02-04 DIAGNOSIS — Z85118 Personal history of other malignant neoplasm of bronchus and lung: Secondary | ICD-10-CM | POA: Diagnosis not present

## 2019-02-04 LAB — POCT URINALYSIS DIPSTICK
Appearance: NEGATIVE
Bilirubin, UA: NEGATIVE
Blood, UA: NEGATIVE
Glucose, UA: NEGATIVE
Ketones, UA: NEGATIVE
Leukocytes, UA: NEGATIVE
Nitrite, UA: NEGATIVE
Odor: NEGATIVE
Protein, UA: NEGATIVE
Spec Grav, UA: 1.01 (ref 1.010–1.025)
Urobilinogen, UA: 0.2 E.U./dL
pH, UA: 6 (ref 5.0–8.0)

## 2019-02-04 NOTE — Telephone Encounter (Signed)
Disc has been received and given to RB.

## 2019-02-04 NOTE — Progress Notes (Signed)
Subjective:    Patient ID: Anna Valentine, female    DOB: March 19, 1951, 68 y.o.   MRN: [id number]  HPI 68 year old Female for Medicare wellness, health maintenance exam and evaluation of medical issues.  Scheduled for video bronchoscopy by Dr. Lamonte Sakai July 29.  History of non-small cell lung cancer stage Ia 2006 status post right upper lobe wedge resection, middle lobe wedge resection, left lower lobectomy and mediastinal lymph node dissection.  Pathology showed 1.2 cm poorly differentiated adenocarcinoma.  Has been followed closely at [facility] and also here.  Apparently there is an ill-defined hypermetabolic nodular opacity in the medial right lung on PET scan and an ill-defined second lesion hypermetabolic in the medial anterior right lung measuring 1.5 x 1.9 cm.  There is a 1.8 cm nodular density in the lingula that is new in the setting of bronchiectasis.  A 9 mm irregular nodule seen in the right lung base was not present on prior study.  Question of tumor versus infection.  Dr. Jana Hakim recently did CT of the brain which was negative.  In February 2011 she was diagnosed breast cancer and underwent left lumpectomy with a diagnosis of lobular carcinoma.  Sentinel node biopsy showed 1 out of 4 lymph nodes positive for invasive lobular carcinoma.  She was clinically stage II.  Tumor was ER positive PR positive HER-2/neu negative.  Radiation therapy completed July 2011.  History of osteopenia.  History of mature cell line neutropenia seen by Dr. Tressie Stalker in 1995.  History of anal fissure 1996.  History of right lower lobe pneumonia December 2007.  Bilateral tubal ligation 1984.  Left bunionectomy 1990.  Right bunionectomy 1992.  Patient underwent bilateral total mastectomies with right sentinel lymph node biopsy September 2019.  Right breast cancer triple positive.  Followed by Dr. Jana Hakim.  Took trastuzumab.  Dr. Aundra Dubin has her on low-dose losartan and carvedilol.  She is on tamoxifen.  She had  atypical chest pain June 2019.  Cardiolite study at that time showed no ischemia.  Plans had been to give her paclitaxel for 12 cycles and then Herceptin for 1 year.  She was intolerant of paclitaxel so was switched to Abraxane.  By echo Dr. Aundra Dubin determined she had pulmonary hypertension.  Biaxin causes hives.  Last colonoscopy was in 2018  Tiny avulsion fracture left ankle 2019  Genetic testing done for breast cancer revealed positive  p53 gene mutation  Social history: She is married.  No children.  She smoked 1-1/2 to 2 packs of cigarettes daily for some 15 years but quit around 1991.  No alcohol consumption.  She completed 4 years of college.  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   Family history: Lost her sister recently to complications of a bone marrow transplant with history of leukemia.  Sister had breast cancer.  One brother died of prostate cancer.  Father died of colon cancer at age 46.  One brother in good health.  Mother died of metastatic lung cancer at age 83.  There is a family history of breast cancer in her mother and sister.  Interestingly her mother had double mastectomies in her 54s but in December 1992 and was found to have lung cancer presumably a new cancer not related to the breast cancer.  Mother developed metastatic disease in the brain and passed away.  Mother had hypertension.      Review of Systems It has been a stressful couple of years for  her.  She has managed it beautifully.  She has a good attitude.     Objective:   Physical Exam Vital signs reviewed.  Skin warm and dry.  Nodes none.  TMs and pharynx are clear.  Neck is supple without JVD thyromegaly or carotid bruits.  Chest clear to auscultation.  Cardiac exam regular rate and rhythm.  Has had bilateral mastectomies.  Abdomen soft nondistended without hepatosplenomegaly masses or tenderness.  No lower extremity edema.  Neuro no focal deficits.  Alert and  oriented x3.  Affect is pleasant.       Assessment & Plan:  History of lung cancer followed by pulmonologist and oncology  New lung nodules identified recently PET scan and is to have bronchoscopy in the near future  Pure hypercholesterolemia-lipid panel essentially normal and improved from 1 year ago.  LDL has improved 1 25-1 05.  Cholesterol total has improved from 2 13-1 91.  History of osteopenia-DEXA scan has been ordered by Dr. Jana Hakim  History of bronchiectasis  History of HSV type I treated with Zovirax  Suspected pulmonary hypertension followed by Dr. Aundra Dubin and is on carvedilol and losartan.  History of left breast cancer in 2011 estrogen receptor positive  History of triple positive right-sided breast cancer 2019 with p53 mutation  Scheduled for repeat colonoscopy with Dr. Havery Moros in September.  Plan: Continue current treatment as outlined above.  To have bronchoscopy in the near future.  DEXA scan results pending.  Return in 1 year or as needed.

## 2019-02-05 ENCOUNTER — Ambulatory Visit: Payer: Medicare Other | Admitting: Nurse Practitioner

## 2019-02-05 ENCOUNTER — Other Ambulatory Visit: Payer: Self-pay

## 2019-02-05 ENCOUNTER — Encounter (HOSPITAL_COMMUNITY): Payer: Self-pay | Admitting: *Deleted

## 2019-02-05 ENCOUNTER — Ambulatory Visit: Payer: Medicare Other | Admitting: Emergency Medicine

## 2019-02-05 NOTE — Patient Instructions (Signed)
It was a pleasure to see you today.  Good luck with upcoming bronchoscopy.  Have DEXA scan.  Up in 1 year or as needed.  Continue excellent care with consultants.

## 2019-02-05 NOTE — Progress Notes (Signed)
Anna Valentine denies chest painor shortness of breath. Patient tested negative for covbid 19, 02/02/2019 and has been in quarantine with her husband since that time.

## 2019-02-05 NOTE — Anesthesia Preprocedure Evaluation (Addendum)
Anesthesia Evaluation  Patient identified by MRN, date of birth, ID band Patient awake    Reviewed: Allergy & Precautions, NPO status , Patient's Chart, lab work & pertinent test results  Airway Mallampati: II  TM Distance: >3 FB Neck ROM: Full    Dental  (+) Teeth Intact, Dental Advisory Given   Pulmonary former smoker,    breath sounds clear to auscultation       Cardiovascular hypertension,  Rhythm:Regular Rate:Normal     Neuro/Psych    GI/Hepatic   Endo/Other    Renal/GU      Musculoskeletal   Abdominal   Peds  Hematology   Anesthesia Other Findings   Reproductive/Obstetrics                            Anesthesia Physical Anesthesia Plan  ASA: III  Anesthesia Plan: General   Post-op Pain Management:    Induction: Intravenous  PONV Risk Score and Plan: Ondansetron and Dexamethasone  Airway Management Planned: Oral ETT  Additional Equipment:   Intra-op Plan:   Post-operative Plan: Extubation in OR  Informed Consent: I have reviewed the patients History and Physical, chart, labs and discussed the procedure including the risks, benefits and alternatives for the proposed anesthesia with the patient or authorized representative who has indicated his/her understanding and acceptance.     Dental advisory given  Plan Discussed with: CRNA and Anesthesiologist  Anesthesia Plan Comments: (Follows with Dr. Aundra Dubin for cardio-oncology monitoring given use of Herceptin. She had atypical chest pain in 12/2017, Cardiolite at that time showed no ischemia.  Echo in 3/20 showed significantly less negative strain, and Herceptin was held.  She was started on Coreg.  Repeat echo in 4/20 showed EF down to 50-55%, losartan was added.  Repeat echo in 6/20 showed EF lower again at 45-50%.  She remains off Herceptin.  She has a history of bronchiectasis and also of extensive right lung resections for  lung cancer (RUL and RML wedges, RLL lobectomy).  TTE 12/28/18:  1. The left ventricle has mildly reduced systolic function, with an ejection fraction of 45-50%. The cavity size was normal. Left ventricular diastolic Doppler parameters are consistent with pseudonormalization. Left ventricular diffuse hypokinesis.  2. The right ventricle has normal systolic function. The cavity was normal. Right ventricular systolic pressure is mildly elevated with an estimated pressure of 42.3 mmHg.  3. The mitral valve is grossly normal. There is mild mitral annular calcification present.  4. The tricuspid valve is grossly normal.  5. The aortic valve is tricuspid. Mild thickening of the aortic valve. No stenosis of the aortic valve.  6. Mild global reduction in LV systolic function; moderate diastolic dysfunction; mild TR; mildly elevated pulmonary pressure; GLS-17.7%.  Nuclear stress 12/20/17:  Nuclear stress EF: 75%.  There was no ST segment deviation noted during stress.  This is a low risk study.  The left ventricular ejection fraction is hyperdynamic (>65%).   1. EF 75% with normal wall motion.  2. Fixed small mild apical septal/apical perfusion defect.  Given normal wall motion, suspect attenuation.   Low risk study. )      Anesthesia Quick Evaluation

## 2019-02-06 ENCOUNTER — Other Ambulatory Visit: Payer: Medicare Other

## 2019-02-06 ENCOUNTER — Inpatient Hospital Stay (HOSPITAL_COMMUNITY): Payer: Medicare Other

## 2019-02-06 ENCOUNTER — Ambulatory Visit (HOSPITAL_COMMUNITY): Payer: Medicare Other | Admitting: Physician Assistant

## 2019-02-06 ENCOUNTER — Ambulatory Visit: Payer: Medicare Other

## 2019-02-06 ENCOUNTER — Inpatient Hospital Stay (HOSPITAL_COMMUNITY)
Admission: RE | Admit: 2019-02-06 | Discharge: 2019-02-10 | DRG: 177 | Disposition: A | Discharge status: Home / Self Care | Payer: Medicare Other | Attending: Emergency Medicine | Admitting: Emergency Medicine

## 2019-02-06 ENCOUNTER — Encounter (HOSPITAL_COMMUNITY): Payer: Self-pay

## 2019-02-06 ENCOUNTER — Inpatient Hospital Stay (HOSPITAL_COMMUNITY)
Admission: RE | Disposition: A | Discharge status: Home / Self Care | Payer: Self-pay | Source: Home / Self Care | Attending: Emergency Medicine

## 2019-02-06 ENCOUNTER — Ambulatory Visit (HOSPITAL_COMMUNITY): Payer: Medicare Other

## 2019-02-06 ENCOUNTER — Other Ambulatory Visit: Payer: Self-pay

## 2019-02-06 DIAGNOSIS — J95811 Postprocedural pneumothorax: Secondary | ICD-10-CM

## 2019-02-06 DIAGNOSIS — I509 Heart failure, unspecified: Secondary | ICD-10-CM | POA: Diagnosis present

## 2019-02-06 DIAGNOSIS — J930 Spontaneous tension pneumothorax: Secondary | ICD-10-CM | POA: Diagnosis not present

## 2019-02-06 DIAGNOSIS — Z978 Presence of other specified devices: Secondary | ICD-10-CM | POA: Diagnosis not present

## 2019-02-06 DIAGNOSIS — I10 Essential (primary) hypertension: Secondary | ICD-10-CM | POA: Diagnosis not present

## 2019-02-06 DIAGNOSIS — J8410 Pulmonary fibrosis, unspecified: Secondary | ICD-10-CM | POA: Diagnosis present

## 2019-02-06 DIAGNOSIS — Z853 Personal history of malignant neoplasm of breast: Secondary | ICD-10-CM | POA: Diagnosis not present

## 2019-02-06 DIAGNOSIS — J42 Unspecified chronic bronchitis: Secondary | ICD-10-CM | POA: Diagnosis not present

## 2019-02-06 DIAGNOSIS — M810 Age-related osteoporosis without current pathological fracture: Secondary | ICD-10-CM | POA: Diagnosis present

## 2019-02-06 DIAGNOSIS — Z803 Family history of malignant neoplasm of breast: Secondary | ICD-10-CM | POA: Diagnosis not present

## 2019-02-06 DIAGNOSIS — R918 Other nonspecific abnormal finding of lung field: Secondary | ICD-10-CM | POA: Diagnosis present

## 2019-02-06 DIAGNOSIS — Z4682 Encounter for fitting and adjustment of non-vascular catheter: Secondary | ICD-10-CM | POA: Diagnosis not present

## 2019-02-06 DIAGNOSIS — J439 Emphysema, unspecified: Secondary | ICD-10-CM | POA: Diagnosis present

## 2019-02-06 DIAGNOSIS — J9382 Other air leak: Secondary | ICD-10-CM | POA: Diagnosis not present

## 2019-02-06 DIAGNOSIS — Z85118 Personal history of other malignant neoplasm of bronchus and lung: Secondary | ICD-10-CM | POA: Diagnosis not present

## 2019-02-06 DIAGNOSIS — J939 Pneumothorax, unspecified: Secondary | ICD-10-CM | POA: Diagnosis not present

## 2019-02-06 DIAGNOSIS — R9389 Abnormal findings on diagnostic imaging of other specified body structures: Secondary | ICD-10-CM

## 2019-02-06 DIAGNOSIS — Z902 Acquired absence of lung [part of]: Secondary | ICD-10-CM

## 2019-02-06 DIAGNOSIS — E785 Hyperlipidemia, unspecified: Secondary | ICD-10-CM | POA: Diagnosis present

## 2019-02-06 DIAGNOSIS — Z825 Family history of asthma and other chronic lower respiratory diseases: Secondary | ICD-10-CM

## 2019-02-06 DIAGNOSIS — Z87891 Personal history of nicotine dependence: Secondary | ICD-10-CM | POA: Diagnosis not present

## 2019-02-06 DIAGNOSIS — Z9013 Acquired absence of bilateral breasts and nipples: Secondary | ICD-10-CM | POA: Diagnosis not present

## 2019-02-06 DIAGNOSIS — Z0184 Encounter for antibody response examination: Secondary | ICD-10-CM

## 2019-02-06 DIAGNOSIS — I2510 Atherosclerotic heart disease of native coronary artery without angina pectoris: Secondary | ICD-10-CM | POA: Diagnosis present

## 2019-02-06 DIAGNOSIS — I110 Hypertensive heart disease with heart failure: Secondary | ICD-10-CM | POA: Diagnosis present

## 2019-02-06 DIAGNOSIS — J850 Gangrene and necrosis of lung: Secondary | ICD-10-CM | POA: Diagnosis present

## 2019-02-06 DIAGNOSIS — Z881 Allergy status to other antibiotic agents status: Secondary | ICD-10-CM

## 2019-02-06 DIAGNOSIS — Z801 Family history of malignant neoplasm of trachea, bronchus and lung: Secondary | ICD-10-CM | POA: Diagnosis not present

## 2019-02-06 DIAGNOSIS — R079 Chest pain, unspecified: Secondary | ICD-10-CM | POA: Diagnosis not present

## 2019-02-06 DIAGNOSIS — Z888 Allergy status to other drugs, medicaments and biological substances status: Secondary | ICD-10-CM

## 2019-02-06 DIAGNOSIS — J209 Acute bronchitis, unspecified: Secondary | ICD-10-CM | POA: Diagnosis not present

## 2019-02-06 DIAGNOSIS — J479 Bronchiectasis, uncomplicated: Secondary | ICD-10-CM | POA: Diagnosis not present

## 2019-02-06 DIAGNOSIS — I272 Pulmonary hypertension, unspecified: Secondary | ICD-10-CM | POA: Diagnosis present

## 2019-02-06 DIAGNOSIS — Z79899 Other long term (current) drug therapy: Secondary | ICD-10-CM

## 2019-02-06 DIAGNOSIS — Z9689 Presence of other specified functional implants: Secondary | ICD-10-CM

## 2019-02-06 DIAGNOSIS — Z419 Encounter for procedure for purposes other than remedying health state, unspecified: Secondary | ICD-10-CM

## 2019-02-06 DIAGNOSIS — K219 Gastro-esophageal reflux disease without esophagitis: Secondary | ICD-10-CM | POA: Diagnosis present

## 2019-02-06 DIAGNOSIS — Z9109 Other allergy status, other than to drugs and biological substances: Secondary | ICD-10-CM

## 2019-02-06 DIAGNOSIS — Z9889 Other specified postprocedural states: Secondary | ICD-10-CM

## 2019-02-06 HISTORY — DX: Postprocedural pneumothorax: J95.811

## 2019-02-06 HISTORY — DX: Pneumonia, unspecified organism: J18.9

## 2019-02-06 HISTORY — DX: Essential (primary) hypertension: I10

## 2019-02-06 HISTORY — PX: VIDEO BRONCHOSCOPY WITH ENDOBRONCHIAL NAVIGATION: SHX6175

## 2019-02-06 HISTORY — DX: Constipation, unspecified: K59.00

## 2019-02-06 HISTORY — PX: VIDEO BRONCHOSCOPY: SHX5072

## 2019-02-06 LAB — CBC
HCT: 36 % (ref 36.0–46.0)
Hemoglobin: 11.8 g/dL — ABNORMAL LOW (ref 12.0–15.0)
MCH: 31.6 pg (ref 26.0–34.0)
MCHC: 32.8 g/dL (ref 30.0–36.0)
MCV: 96.5 fL (ref 80.0–100.0)
Platelets: 232 10*3/uL (ref 150–400)
RBC: 3.73 MIL/uL — ABNORMAL LOW (ref 3.87–5.11)
RDW: 12.1 % (ref 11.5–15.5)
WBC: 7 10*3/uL (ref 4.0–10.5)
nRBC: 0 % (ref 0.0–0.2)

## 2019-02-06 LAB — BASIC METABOLIC PANEL
Anion gap: 4 — ABNORMAL LOW (ref 5–15)
BUN: 11 mg/dL (ref 8–23)
CO2: 27 mmol/L (ref 22–32)
Calcium: 8.6 mg/dL — ABNORMAL LOW (ref 8.9–10.3)
Chloride: 104 mmol/L (ref 98–111)
Creatinine, Ser: 0.59 mg/dL (ref 0.44–1.00)
GFR calc Af Amer: >60 mL/min (ref >60)
GFR calc non Af Amer: >60 mL/min (ref >60)
Glucose, Bld: 126 mg/dL — ABNORMAL HIGH (ref 70–99)
Potassium: 4.3 mmol/L (ref 3.5–5.1)
Sodium: 135 mmol/L (ref 135–145)

## 2019-02-06 LAB — PHOSPHORUS: Phosphorus: 3.2 mg/dL (ref 2.5–4.6)

## 2019-02-06 LAB — MAGNESIUM: Magnesium: 2.1 mg/dL (ref 1.7–2.4)

## 2019-02-06 SURGERY — VIDEO BRONCHOSCOPY WITH ENDOBRONCHIAL NAVIGATION
Anesthesia: General | Site: Chest | Laterality: Left

## 2019-02-06 MED ORDER — PROPOFOL 10 MG/ML IV BOLUS
INTRAVENOUS | Status: AC
  Filled 2019-02-06: qty 20

## 2019-02-06 MED ORDER — OXYCODONE HCL 5 MG PO TABS
5.0000 mg | ORAL_TABLET | ORAL | Status: DC | PRN
  Administered 2019-02-06 – 2019-02-07 (×4): 5 mg via ORAL
  Filled 2019-02-06 (×4): qty 1

## 2019-02-06 MED ORDER — VITAMIN C 500 MG PO TABS
500.0000 mg | ORAL_TABLET | Freq: Every day | ORAL | Status: DC
  Administered 2019-02-07 – 2019-02-10 (×4): 500 mg via ORAL
  Filled 2019-02-06 (×4): qty 1

## 2019-02-06 MED ORDER — ALBUTEROL SULFATE (2.5 MG/3ML) 0.083% IN NEBU: 2.5000 mg | INHALATION_SOLUTION | Freq: Once | RESPIRATORY_TRACT | Status: DC

## 2019-02-06 MED ORDER — ALBUTEROL SULFATE (2.5 MG/3ML) 0.083% IN NEBU
INHALATION_SOLUTION | RESPIRATORY_TRACT | Status: AC
  Filled 2019-02-06: qty 3

## 2019-02-06 MED ORDER — CARVEDILOL 6.25 MG PO TABS
6.2500 mg | ORAL_TABLET | Freq: Two times a day (BID) | ORAL | Status: DC
  Administered 2019-02-06 – 2019-02-10 (×8): 6.25 mg via ORAL
  Filled 2019-02-06 (×8): qty 1

## 2019-02-06 MED ORDER — OXYCODONE HCL 5 MG PO TABS
ORAL_TABLET | ORAL | Status: AC
  Filled 2019-02-06: qty 1

## 2019-02-06 MED ORDER — FENTANYL CITRATE (PF) 100 MCG/2ML IJ SOLN: 100.0000 ug | Freq: Once | INTRAMUSCULAR | Status: DC

## 2019-02-06 MED ORDER — FENTANYL CITRATE (PF) 100 MCG/2ML IJ SOLN
INTRAMUSCULAR | Status: AC
  Filled 2019-02-06: qty 2

## 2019-02-06 MED ORDER — ROCURONIUM BROMIDE 10 MG/ML (PF) SYRINGE
PREFILLED_SYRINGE | INTRAVENOUS | Status: AC
  Filled 2019-02-06: qty 10

## 2019-02-06 MED ORDER — MORPHINE SULFATE (PF) 2 MG/ML IV SOLN
2.0000 mg | INTRAVENOUS | Status: DC | PRN
  Administered 2019-02-06: 4 mg via INTRAVENOUS
  Filled 2019-02-06: qty 1

## 2019-02-06 MED ORDER — ONDANSETRON HCL 4 MG/2ML IJ SOLN
INTRAMUSCULAR | Status: DC | PRN
  Administered 2019-02-06: 4 mg via INTRAVENOUS

## 2019-02-06 MED ORDER — TAMOXIFEN CITRATE 10 MG PO TABS
20.0000 mg | ORAL_TABLET | Freq: Every day | ORAL | Status: DC
  Administered 2019-02-07 – 2019-02-10 (×4): 20 mg via ORAL
  Filled 2019-02-06 (×5): qty 2

## 2019-02-06 MED ORDER — LOSARTAN POTASSIUM 25 MG PO TABS
25.0000 mg | ORAL_TABLET | Freq: Two times a day (BID) | ORAL | Status: DC
  Administered 2019-02-06 – 2019-02-10 (×8): 25 mg via ORAL
  Filled 2019-02-06 (×8): qty 1

## 2019-02-06 MED ORDER — 0.9 % SODIUM CHLORIDE (POUR BTL) OPTIME
TOPICAL | Status: DC | PRN
  Administered 2019-02-06: 11:00:00 1000 mL

## 2019-02-06 MED ORDER — MIDAZOLAM HCL 2 MG/2ML IJ SOLN
INTRAMUSCULAR | Status: AC
  Filled 2019-02-06: qty 2

## 2019-02-06 MED ORDER — IBUPROFEN 600 MG PO TABS
600.0000 mg | ORAL_TABLET | Freq: Four times a day (QID) | ORAL | Status: DC | PRN
  Administered 2019-02-07 – 2019-02-10 (×7): 600 mg via ORAL
  Filled 2019-02-06 (×7): qty 1

## 2019-02-06 MED ORDER — PROPOFOL 10 MG/ML IV BOLUS
INTRAVENOUS | Status: DC | PRN
  Administered 2019-02-06: 130 mg via INTRAVENOUS

## 2019-02-06 MED ORDER — SODIUM CHLORIDE 0.9 % IV SOLN
INTRAVENOUS | Status: DC | PRN
  Administered 2019-02-06: 25 ug/min via INTRAVENOUS

## 2019-02-06 MED ORDER — METOPROLOL TARTRATE 5 MG/5ML IV SOLN
INTRAVENOUS | Status: AC
  Filled 2019-02-06: qty 5

## 2019-02-06 MED ORDER — FENTANYL CITRATE (PF) 100 MCG/2ML IJ SOLN
INTRAMUSCULAR | Status: DC | PRN
  Administered 2019-02-06: 50 ug via INTRAVENOUS

## 2019-02-06 MED ORDER — FENTANYL CITRATE (PF) 100 MCG/2ML IJ SOLN
25.0000 ug | INTRAMUSCULAR | Status: DC | PRN
  Administered 2019-02-06 (×2): 50 ug via INTRAVENOUS

## 2019-02-06 MED ORDER — ROCURONIUM BROMIDE 10 MG/ML (PF) SYRINGE
PREFILLED_SYRINGE | INTRAVENOUS | Status: DC | PRN
  Administered 2019-02-06: 20 mg via INTRAVENOUS
  Administered 2019-02-06: 40 mg via INTRAVENOUS
  Administered 2019-02-06: 20 mg via INTRAVENOUS

## 2019-02-06 MED ORDER — OXYCODONE HCL 5 MG PO TABS
5.0000 mg | ORAL_TABLET | Freq: Once | ORAL | Status: AC | PRN
  Administered 2019-02-06: 5 mg via ORAL

## 2019-02-06 MED ORDER — VITAMIN D 25 MCG (1000 UNIT) PO TABS
1000.0000 [IU] | ORAL_TABLET | Freq: Every day | ORAL | Status: DC
  Administered 2019-02-07 – 2019-02-10 (×4): 1000 [IU] via ORAL
  Filled 2019-02-06 (×4): qty 1

## 2019-02-06 MED ORDER — HEPARIN SODIUM (PORCINE) 5000 UNIT/ML IJ SOLN
5000.0000 [IU] | Freq: Three times a day (TID) | INTRAMUSCULAR | Status: DC
  Administered 2019-02-06 – 2019-02-10 (×12): 5000 [IU] via SUBCUTANEOUS
  Filled 2019-02-06 (×10): qty 1

## 2019-02-06 MED ORDER — SUGAMMADEX SODIUM 200 MG/2ML IV SOLN
INTRAVENOUS | Status: DC | PRN
  Administered 2019-02-06: 110 mg via INTRAVENOUS

## 2019-02-06 MED ORDER — FENTANYL CITRATE (PF) 250 MCG/5ML IJ SOLN
INTRAMUSCULAR | Status: AC
  Filled 2019-02-06: qty 5

## 2019-02-06 MED ORDER — LIDOCAINE 2% (20 MG/ML) 5 ML SYRINGE
INTRAMUSCULAR | Status: AC
  Filled 2019-02-06: qty 5

## 2019-02-06 MED ORDER — MORPHINE SULFATE (PF) 4 MG/ML IV SOLN
INTRAVENOUS | Status: AC
  Administered 2019-02-06: 18:00:00 4 mg
  Filled 2019-02-06: qty 1

## 2019-02-06 MED ORDER — OXYCODONE HCL 5 MG/5ML PO SOLN: 5.0000 mg | Freq: Once | ORAL | Status: AC | PRN

## 2019-02-06 MED ORDER — LACTATED RINGERS IV SOLN
INTRAVENOUS | Status: DC
  Administered 2019-02-06: 08:00:00 via INTRAVENOUS

## 2019-02-06 MED ORDER — LIDOCAINE 2% (20 MG/ML) 5 ML SYRINGE
INTRAMUSCULAR | Status: DC | PRN
  Administered 2019-02-06: 40 mg via INTRAVENOUS

## 2019-02-06 MED ORDER — TRAMADOL HCL 50 MG PO TABS
50.0000 mg | ORAL_TABLET | Freq: Four times a day (QID) | ORAL | Status: DC | PRN
  Administered 2019-02-08 – 2019-02-10 (×5): 50 mg via ORAL
  Filled 2019-02-06 (×7): qty 1

## 2019-02-06 MED ORDER — KETOROLAC TROMETHAMINE 15 MG/ML IJ SOLN
INTRAMUSCULAR | Status: AC
  Administered 2019-02-06: 18:00:00 15 mg
  Filled 2019-02-06: qty 1

## 2019-02-06 MED ORDER — KETOROLAC TROMETHAMINE 15 MG/ML IJ SOLN
15.0000 mg | Freq: Once | INTRAMUSCULAR | Status: AC
  Administered 2019-02-06: 18:00:00 15 mg via INTRAVENOUS

## 2019-02-06 MED ORDER — ONDANSETRON HCL 4 MG/2ML IJ SOLN: 4.0000 mg | Freq: Once | INTRAMUSCULAR | Status: DC | PRN

## 2019-02-06 MED ORDER — DEXAMETHASONE SODIUM PHOSPHATE 10 MG/ML IJ SOLN
INTRAMUSCULAR | Status: DC | PRN
  Administered 2019-02-06: 5 mg via INTRAVENOUS

## 2019-02-06 MED ORDER — ACYCLOVIR 400 MG PO TABS: 400.0000 mg | ORAL_TABLET | ORAL | Status: DC

## 2019-02-06 MED ORDER — DEXAMETHASONE SODIUM PHOSPHATE 10 MG/ML IJ SOLN
INTRAMUSCULAR | Status: AC
  Filled 2019-02-06: qty 1

## 2019-02-06 MED ORDER — CALCIUM CARBONATE-VITAMIN D 500-200 MG-UNIT PO TABS
1.0000 | ORAL_TABLET | Freq: Every day | ORAL | Status: DC
  Administered 2019-02-07 – 2019-02-10 (×4): 1 via ORAL
  Filled 2019-02-06 (×4): qty 1

## 2019-02-06 MED ORDER — ONDANSETRON HCL 4 MG/2ML IJ SOLN
INTRAMUSCULAR | Status: AC
  Filled 2019-02-06: qty 2

## 2019-02-06 MED ORDER — MIDAZOLAM HCL 2 MG/2ML IJ SOLN
INTRAMUSCULAR | Status: DC | PRN
  Administered 2019-02-06: 2 mg via INTRAVENOUS

## 2019-02-06 SURGICAL SUPPLY — 46 items
ADAPTER BRONCHOSCOPE OLYMPUS (ADAPTER) ×3 IMPLANT
ADAPTER VALVE BIOPSY EBUS (MISCELLANEOUS) IMPLANT
ADPR BSCP OLMPS EDG (ADAPTER) ×1
ADPTR VALVE BIOPSY EBUS (MISCELLANEOUS)
BRUSH CYTOL CELLEBRITY 1.5X140 (MISCELLANEOUS) ×3 IMPLANT
BRUSH SUPERTRAX BIOPSY (INSTRUMENTS) IMPLANT
BRUSH SUPERTRAX NDL-TIP CYTO (INSTRUMENTS) ×6 IMPLANT
CANISTER SUCT 3000ML PPV (MISCELLANEOUS) ×3 IMPLANT
CHANNEL WORK EXTEND EDGE 180 (KITS) IMPLANT
CHANNEL WORK EXTEND EDGE 90 (KITS) IMPLANT
CONT SPEC 4OZ CLIKSEAL STRL BL (MISCELLANEOUS) ×9 IMPLANT
COVER BACK TABLE 60X90IN (DRAPES) ×3 IMPLANT
COVER WAND RF STERILE (DRAPES) ×3 IMPLANT
FILTER STRAW FLUID ASPIR (MISCELLANEOUS) IMPLANT
FORCEPS BIOP SUPERTRX PREMAR (INSTRUMENTS) ×3 IMPLANT
GAUZE SPONGE 4X4 12PLY STRL (GAUZE/BANDAGES/DRESSINGS) ×3 IMPLANT
GLOVE BIO SURGEON STRL SZ7.5 (GLOVE) ×6 IMPLANT
GOWN STRL REUS W/ TWL LRG LVL3 (GOWN DISPOSABLE) ×2 IMPLANT
GOWN STRL REUS W/TWL LRG LVL3 (GOWN DISPOSABLE) ×6
KIT CLEAN ENDO COMPLIANCE (KITS) ×3 IMPLANT
KIT LOCATABLE GUIDE (CANNULA) IMPLANT
KIT MARKER FIDUCIAL DELIVERY (KITS) IMPLANT
KIT PROCEDURE EDGE 180 (KITS) ×3 IMPLANT
KIT PROCEDURE EDGE 90 (KITS) IMPLANT
KIT TURNOVER KIT B (KITS) ×3 IMPLANT
MARKER SKIN DUAL TIP RULER LAB (MISCELLANEOUS) ×3 IMPLANT
NEEDLE SUPERTRX PREMARK BIOPSY (NEEDLE) ×3 IMPLANT
NS IRRIG 1000ML POUR BTL (IV SOLUTION) ×3 IMPLANT
OIL SILICONE PENTAX (PARTS (SERVICE/REPAIRS)) ×3 IMPLANT
PAD ARMBOARD 7.5X6 YLW CONV (MISCELLANEOUS) ×6 IMPLANT
PATCHES PATIENT (LABEL) ×9 IMPLANT
STOPCOCK 4 WAY LG BORE MALE ST (IV SETS) ×3 IMPLANT
SYR 20ML ECCENTRIC (SYRINGE) ×3 IMPLANT
SYR 20ML LL LF (SYRINGE) ×3 IMPLANT
SYR 50ML SLIP (SYRINGE) ×6 IMPLANT
SYR 5ML LL (SYRINGE) ×3 IMPLANT
SYRINGE 20CC LL (MISCELLANEOUS) ×3 IMPLANT
TOWEL GREEN STERILE FF (TOWEL DISPOSABLE) ×3 IMPLANT
TRAP SPECIMEN MUCOUS 40CC (MISCELLANEOUS) ×3 IMPLANT
TUBE CONNECTING 20'X1/4 (TUBING) ×1
TUBE CONNECTING 20X1/4 (TUBING) ×2 IMPLANT
UNDERPAD 30X30 (UNDERPADS AND DIAPERS) ×3 IMPLANT
VALVE BIOPSY  SINGLE USE (MISCELLANEOUS) ×2
VALVE BIOPSY SINGLE USE (MISCELLANEOUS) ×1 IMPLANT
VALVE SUCTION BRONCHIO DISP (MISCELLANEOUS) ×3 IMPLANT
WATER STERILE IRR 1000ML POUR (IV SOLUTION) ×3 IMPLANT

## 2019-02-06 NOTE — Progress Notes (Signed)
Patient complain of chest pain.. VSS. Dr. Lamonte Sakai notified and pain medicine ordered. Will continue to monitor patient

## 2019-02-06 NOTE — Progress Notes (Signed)
Anna Valentine is a 68 y.o. female patient admitted. Awake, alert - oriented  X 4 - no acute distress noted.  VSS - Blood pressure (Abnormal) 177/84, pulse 68, temperature 97.9 F (36.6 C), temperature source Oral, resp. rate 16, height 5\' 5"  (1.651 m), weight 53.1 kg, SpO2 94 %.    IV in place, occlusive dsg intact without redness.  Orientation to room, and floor completed.  Admission INP armband ID verified with patient/family, and in place.   SR up x 2, fall assessment complete, with patient and family able to verbalize understanding of risk associated with falls, and verbalized understanding to call nsg before up out of bed.  Call light within reach, patient able to voice, and demonstrate understanding. No evidence of skin break down noted on exam.  Admission nurse notified of admission.     Will cont to eval and treat per MD orders.  Theora Master, RN 02/06/2019 2:37 PM

## 2019-02-06 NOTE — Anesthesia Postprocedure Evaluation (Signed)
Anesthesia Post Note  Patient: Anna Valentine  Procedure(s) Performed: VIDEO BRONCHOSCOPY WITH ENDOBRONCHIAL NAVIGATION, left lung (Left Chest)     Patient location during evaluation: PACU Anesthesia Type: General Level of consciousness: awake and alert Pain management: pain level controlled Vital Signs Assessment: post-procedure vital signs reviewed and stable Respiratory status: spontaneous breathing, nonlabored ventilation, respiratory function stable and patient connected to nasal cannula oxygen Cardiovascular status: blood pressure returned to baseline and stable Postop Assessment: no apparent nausea or vomiting Anesthetic complications: no Comments: Patient found to have post-op L pneumothorax following  EBUS. Pigtail chest tube catheter inserted by Dr. Lamonte Sakai. Stable vitals with O2 sat 95% on 2L O2. Follow-up CXR ordered by Dr.Byrum.  Roberts Gaudy    Last Vitals:  Vitals:   02/06/19 1350 02/06/19 1435  BP: (!) 150/76 (!) 177/84  Pulse: 69 68  Resp: 14 16  Temp: 36.8 C 36.6 C  SpO2: 90% 94%    Last Pain:  Vitals:   02/06/19 1438  TempSrc:   PainSc: 7                  Giuseppina Quinones COKER

## 2019-02-06 NOTE — H&P (Signed)
Anna Valentine is an 68 y.o. female.   Chief Complaint: Pulmonary nodules HPI:  68 year old former smoker with a history of p53 positivity, adenocarcinoma of the right lower lobe status post right lower lobectomy.  Also with a history of bilateral breast cancer, most recently treated with immunotherapy managed by Dr. Jana Hakim.  At the time of her lobectomy she also had pulmonary nodular disease in the right upper lobe, right middle lobe.  Wedge resections at the time of her lobectomy showed necrotizing granulomatous disease.  She never grew out Mycobacterium or any organism.  She is been followed for waxing and waning pulmonary nodular disease with serial imaging.  A restaging PET scan done on 01/18/2019 revealed 2 new right-sided pulmonary nodules and a lingular nodule.  All 3 of these were hypermetabolic, more so on the right than in the lingula.  This of course prompted concern about possible metastatic disease, recurrent primary lung cancer, possible atypical or opportunistic infection.  Recommendation was made to achieve a tissue diagnosis, obtain culture data via navigational bronchoscopy.  Patient presents today for further evaluation and bronchoscopy as above.  She denies any new problems.  Her cough is under good control.  Minimal sputum production.  Review of her CT scan of the chest from 7/25 shows that the 2 right-sided nodules have actually resolved in the interim from 7/10.  Discussed this with her, certainly most consistent with an inflammatory or possibly infectious process.  The lingular nodule is unchanged and remain suspicious.   Past Medical History:  Diagnosis Date  . Anemia   . BRCA negative 2011   BRCA I/ II negative  . Breast cancer ([facility]) 08/2009   stage 2, rx with lumpectomy and xrt  . CHF (congestive heart failure) ([place])   . Constipation   . COPD (chronic obstructive pulmonary disease) ([facility])    pt.unsure of diagnosis status  . Emphysema of lung ([place])    pt. questions  diagnosis  . Family history of adverse reaction to anesthesia    My Sister has nausea  . Family history of breast cancer   . Family history of colon cancer   . Family history of lung cancer   . GERD (gastroesophageal reflux disease)    not presently having symptoms  . Hypertension   . Lung cancer ([facility]) 06/06/05   stage 1 poorly differentiated adenocarcinoma, s/p right lower lobectomy.  . Osteoporosis   . Personal history of lung cancer   . Pneumonia    2007ish, walking pnemonia - 2009 ish  . STD (sexually transmitted disease)    HSV    Past Surgical History:  Procedure Laterality Date  . BREAST BIOPSY    . BREAST LUMPECTOMY  10/12/2009   Left lumpectomy and radiation, stage II, ER/PR+, Her 2 nu negative  . BUNIONECTOMY Bilateral   . COLONOSCOPY    . COLONOSCOPY    . LOBECTOMY Right 06/06/2005   [place]  . MASTECTOMY W/ SENTINEL NODE BIOPSY Bilateral 03/15/2018  . MASTECTOMY W/ SENTINEL NODE BIOPSY Bilateral 03/15/2018   Procedure: BILATERAL TOTAL MASTECTOMIES WITH RIGHT SENTINEL LYMPH NODE BIOPSY;  Surgeon: Rolm Bookbinder, MD;  Location: [facility];  Service: General;  Laterality: Bilateral;  . PORTACATH PLACEMENT N/A 03/15/2018   Procedure: INSERTION PORT-A-CATH WITH [place];  Surgeon: Rolm Bookbinder, MD;  Location: [facility];  Service: General;  Laterality: N/A;  . TONSILLECTOMY    . TUBAL LIGATION  1984    Family History  Problem Relation Age of Onset  . Allergies Mother   .  Asthma Mother   . Lung cancer Mother   . Breast cancer Mother 36       recurrence age 10  . Colon cancer Father 52  . Prostate cancer Brother 36  . Breast cancer Sister 83       Recurrence age 13 BRCA negative  . Colon polyps Sister   . Leukemia Sister   . Breast cancer Sister 9  . Prostate cancer Brother 62  . Breast cancer Maternal Grandmother 15  . Colon cancer Maternal Aunt   . Leukemia Maternal Grandfather   . Lung cancer Maternal Aunt   . Breast cancer Cousin   . Esophageal cancer Neg Hx    . Rectal cancer Neg Hx   . Stomach cancer Neg Hx    Social History:  reports that she quit smoking about 31 years ago. She has a 36.00 pack-year smoking history. She has never used smokeless tobacco. She reports current alcohol use of about 14.0 standard drinks of alcohol per week. She reports that she does not use drugs.  Allergies:  Allergies  Allergen Reactions  . Taxol [Paclitaxel] Anaphylaxis  . Tape Itching  . Abraxane [Paclitaxel Protein-Bound Part] Rash  . Clarithromycin Rash    Medications Prior to Admission  Medication Sig Dispense Refill  . acyclovir (ZOVIRAX) 400 MG tablet TAKE 1 TABLET BY MOUTH TWICE A DAY (Patient taking differently: Take 400 mg by mouth See admin instructions. For breakouts take 416m by mouth twice a day.) 90 tablet 3  . calcium-vitamin D (OSCAL WITH D) 500-200 MG-UNIT tablet Take 1 tablet by mouth daily.    . carvedilol (COREG) 6.25 MG tablet Take 1 tablet (6.25 mg total) by mouth 2 (two) times daily. 180 tablet 3  . cholecalciferol (VITAMIN D) 1000 UNITS tablet Take 1,000 Units by mouth daily.    .Marland Kitchenlosartan (COZAAR) 25 MG tablet Take 1 tablet (25 mg total) by mouth 2 (two) times a day. 60 tablet 6  . Psyllium (METAMUCIL PO) Take 1 Scoop by mouth daily.     . tamoxifen (NOLVADEX) 20 MG tablet Take 20 mg by mouth daily.    . vitamin C (ASCORBIC ACID) 500 MG tablet Take 500 mg by mouth daily.    .Marland Kitchenlidocaine-prilocaine (EMLA) cream Apply 1 application topically as needed. 30 g 0    Results for orders placed or performed in visit on 02/04/19 (from the past 48 hour(s))  POCT urinalysis dipstick     Status: Normal   Collection Time: 02/04/19 11:11 AM  Result Value Ref Range   Color, UA YELLOW    Clarity, UA CLEAR    Glucose, UA Negative Negative   Bilirubin, UA NEG    Ketones, UA NEG    Spec Grav, UA 1.010 1.010 - 1.025   Blood, UA NEG    pH, UA 6.0 5.0 - 8.0   Protein, UA Negative Negative   Urobilinogen, UA 0.2 0.2 or 1.0 E.U./dL   Nitrite, UA  NEG    Leukocytes, UA Negative Negative   Appearance NEG    Odor NEG    No results found.  ROS  Blood pressure (!) 180/69, pulse 66, temperature 98.2 F (36.8 C), resp. rate 17, height _0  (1.651 m), weight 53.1 kg, SpO2 100 %. Physical Exam  Pleasant thin woman, no distress, normal respiratory pattern HEENT oropharynx clear, M1 airway, no lesions Lungs distant, no wheezing, clear Heart regular without a murmur. Abdomen soft, nontender with positive bowel sounds Extremities without any significant edema  Neurologically she is awake, alert, interacting appropriately, nonfocal.  Assessment/Plan Waxing and waning pulmonary nodules in a patient with a history of breast cancer, adenocarcinoma of the lung and necrotizing granulomatous disease noted on previous wedge resections.  We discussed the new CT findings 02/02/2019, the resolution of the right-sided nodules.  The lingular nodule remains suspicious.  I recommended that we proceed with her navigational bronchoscopy, try to obtain lingular nodule samples, perform BAL for culture data to guide future therapy.  She understands procedure, risk, benefits and agrees to proceed.  All questions answered.   Collene Gobble, MD 02/06/2019, 9:34 AM

## 2019-02-06 NOTE — Transfer of Care (Signed)
Immediate Anesthesia Transfer of Care Note  Patient: Anna Valentine  Procedure(s) Performed: VIDEO BRONCHOSCOPY WITH ENDOBRONCHIAL NAVIGATION, left lung (Left Chest)  Patient Location: PACU  Anesthesia Type:General  Level of Consciousness: awake  Airway & Oxygen Therapy: Patient Spontanous Breathing and Patient connected to nasal cannula oxygen  Post-op Assessment: Report given to RN  Post vital signs: Reviewed and stable  Last Vitals:  Vitals Value Taken Time  BP 160/86 02/06/19 1148  Temp    Pulse 76 02/06/19 1148  Resp    SpO2 99 % 02/06/19 1148  Vitals shown include unvalidated device data.  Last Pain:  Vitals:   02/06/19 0823  PainSc: 0-No pain         Complications: No apparent anesthesia complications

## 2019-02-06 NOTE — Procedures (Signed)
Procedure note  Operator: Baltazar Apo Procedure: Left tube thoracostomy Indication: Postprocedural left tension pneumothorax Medications: Fentanyl 100 mcg in divided doses, 1% lidocaine locally Complications: None apparent  Procedure details: 68 year old woman who is status post navigational bronchoscopy today with lingular biopsies and brushings.  Postprocedural chest x-ray revealed a large left pneumothorax.  The patient was somewhat uncomfortable, having difficulty taking a deep inspiration, requiring high flow oxygen.  Consent for urgent left tube thoracostomy was obtained.  Pigtail chest tube placed in the 6 intercostal space by Seldinger technique without difficulty.  Good rush of air.  Placed to -20 cm water suction.  Follow-up chest x-ray is pending.  Baltazar Apo, MD, PhD 02/06/2019, 12:39 PM [facility] [phone] or if no answer [phone]

## 2019-02-06 NOTE — Discharge Instructions (Signed)
Flexible Bronchoscopy, Care After This sheet gives you information about how to care for yourself after your test. Your doctor may also give you more specific instructions. If you have problems or questions, contact your doctor. Follow these instructions at home: Eating and drinking  Do not eat or drink anything (not even water) for 2 hours after your test, or until your numbing medicine (local anesthetic) wears off.  When your numbness is gone and your cough and gag reflexes have come back, you may: ? Eat only soft foods. ? Slowly drink liquids.  The day after the test, go back to your normal diet. Driving  Do not drive for 24 hours if you were given a medicine to help you relax (sedative).  Do not drive or use heavy machinery while taking prescription pain medicine. General instructions   Take over-the-counter and prescription medicines only as told by your doctor.  Return to your normal activities as told. Ask what activities are safe for you.  Do not use any products that have nicotine or tobacco in them. This includes cigarettes and e-cigarettes. If you need help quitting, ask your doctor.  Keep all follow-up visits as told by your doctor. This is important. It is very important if you had a tissue sample (biopsy) taken. Get help right away if:  You have shortness of breath that gets worse.  You get light-headed.  You feel like you are going to pass out (faint).  You have chest pain.  You cough up: ? More than a little blood. ? More blood than before. Summary  Do not eat or drink anything (not even water) for 2 hours after your test, or until your numbing medicine wears off.  Do not use cigarettes. Do not use e-cigarettes.  Get help right away if you have chest pain.   Please call Dr. Lamonte Sakai with any questions or concerns.  [phone].   This information is not intended to replace advice given to you by your health care provider. Make sure you discuss any  questions you have with your health care provider. Document Released: 04/24/2009 Document Revised: 06/09/2017 Document Reviewed: 07/15/2016 Elsevier Patient Education  2020 [employer].

## 2019-02-06 NOTE — Anesthesia Procedure Notes (Signed)
Procedure Name: Intubation Date/Time: 02/06/2019 10:18 AM Performed by: Barrington Ellison, CRNA Pre-anesthesia Checklist: Patient identified, Emergency Drugs available, Suction available and Patient being monitored Patient Re-evaluated:Patient Re-evaluated prior to induction Oxygen Delivery Method: Circle System Utilized Preoxygenation: Pre-oxygenation with 100% oxygen Induction Type: IV induction Ventilation: Mask ventilation without difficulty Laryngoscope Size: Mac and 3 Grade View: Grade I Tube type: Oral Tube size: 8.5 mm Number of attempts: 1 Airway Equipment and Method: Stylet and Oral airway Placement Confirmation: ETT inserted through vocal cords under direct vision,  positive ETCO2 and breath sounds checked- equal and bilateral Secured at: 20 cm Tube secured with: Tape Dental Injury: Teeth and Oropharynx as per pre-operative assessment

## 2019-02-06 NOTE — Op Note (Signed)
Video Bronchoscopy with Electromagnetic Navigation Procedure Note  Date of Operation: 02/06/2019  Pre-op Diagnosis: Pulmonary nodules  Post-op Diagnosis: Same  Surgeon: Baltazar Apo  Assistants: None  Anesthesia: General endotracheal anesthesia  Operation: Flexible video fiberoptic bronchoscopy with electromagnetic navigation and biopsies.  Estimated Blood Loss: Minimal  Complications: None apparent  Indications and History: Anna Valentine is a 68 y.o. female with history of tobacco use, remote adenocarcinoma of the lung with a right lower lobectomy.  Also with a history of bilateral breast cancers, p53 positivity.  She was found to have hypermetabolic pulmonary nodules on follow-up PET scan.  Recommendation was made to achieve a tissue diagnosis via navigational bronchoscopy.  In the end only the lingular nodule remained and it was targeted.  The risks, benefits, complications, treatment options and expected outcomes were discussed with the patient.  The possibilities of pneumothorax, pneumonia, reaction to medication, pulmonary aspiration, perforation of a viscus, bleeding, failure to diagnose a condition and creating a complication requiring transfusion or operation were discussed with the patient who freely signed the consent.    Description of Procedure: The patient was seen in the Preoperative Area, was examined and was deemed appropriate to proceed.  The patient was taken to OR 11, identified as Anna Valentine and the procedure verified as Flexible Video Fiberoptic Bronchoscopy.  A Time Out was held and the above information confirmed.   Prior to the date of the procedure a high-resolution CT scan of the chest was performed. Utilizing [place] a virtual tracheobronchial tree was generated to allow the creation of distinct navigation pathways to the patient's parenchymal abnormalities. After being taken to the operating room general anesthesia was initiated and the  patient  was orally intubated. The video fiberoptic bronchoscope was introduced via the endotracheal tube and a general inspection was performed which showed some airway distortion due to her previous lobectomy.  Stump and staple site were intact.  There were no endobronchial lesions or abnormal secretions seen on the entire exam.  A bronchoalveolar lavage was performed for culture data in the right upper lobe.  60 cc of normal saline were instilled, approximately 20 to 25 cc were returned.  The extendable working channel and locator guide were introduced into the bronchoscope. The distinct navigation pathways prepared prior to this procedure were then utilized to navigate to within 0.5 to 1.1 cm of patient's lingular nodule identified on CT scan. The extendable working channel was secured into place and the locator guide was withdrawn. Under fluoroscopic guidance transbronchial needle brushings, transbronchial Wang needle biopsies, and transbronchial forceps biopsies were performed to be sent for cytology and pathology. A bronchioalveolar lavage was performed in the lingula through the working channel and sent for cytology and microbiology (bacterial, fungal, AFB smears and cultures).  25 cc instilled, approximately 10 returned.  At the end of the procedure a general airway inspection was performed and there was no evidence of active bleeding. The bronchoscope was removed.  The patient tolerated the procedure well. There was no significant blood loss and there were no obvious complications. A post-procedural chest x-ray is pending.  Samples: 1. Transbronchial needle brushings from lingular nodule 2. Transbronchial Wang needle biopsies from lingular nodule 3. Transbronchial forceps biopsies from lingular nodule 4. Bronchoalveolar lavage from lingula 5.  Bronchial alveolar lavage from the right upper lobe  Plans:  The patient will be discharged from the PACU to home when recovered from anesthesia and  after chest x-ray is reviewed. We will review the cytology,  pathology and microbiology results with the patient when they become available. Outpatient followup will be with Dr Lamonte Sakai.     Baltazar Apo, MD, PhD 02/06/2019, 11:49 AM [facility] [phone] or if no answer [phone]

## 2019-02-06 NOTE — H&P (Signed)
LESLYE PUCCINI is an 68 y.o. female.   Chief Complaint: Pulmonary nodules HPI:  68 year old former smoker with a history of p53 positivity, adenocarcinoma of the right lower lobe status post right lower lobectomy.  Also with a history of bilateral breast cancer, most recently treated with immunotherapy managed by Dr. Jana Hakim.  At the time of her lobectomy she also had pulmonary nodular disease in the right upper lobe, right middle lobe.  Wedge resections at the time of her lobectomy showed necrotizing granulomatous disease.  She never grew out Mycobacterium or any organism.  She is been followed for waxing and waning pulmonary nodular disease with serial imaging.  A restaging PET scan done on 01/18/2019 revealed 2 new right-sided pulmonary nodules and a lingular nodule.  All 3 of these were hypermetabolic, more so on the right than in the lingula.  This of course prompted concern about possible metastatic disease, recurrent primary lung cancer, possible atypical or opportunistic infection.  Recommendation was made to achieve a tissue diagnosis, obtain culture data via navigational bronchoscopy.  Patient presents today for further evaluation and bronchoscopy as above.  She denies any new problems.  Her cough is under good control.  Minimal sputum production.  Review of her CT scan of the chest from 7/25 shows that the 2 right-sided nodules have actually resolved in the interim from 7/10.  Discussed this with her, certainly most consistent with an inflammatory or possibly infectious process.  The lingular nodule is unchanged and remain suspicious. Postoperatively developed left side pneumothorax, cook-catheter placed. Will admit to med-surg bed.    Past Medical History:  Diagnosis Date  . Anemia   . BRCA negative 2011   BRCA I/ II negative  . Breast cancer ([facility]) 08/2009   stage 2, rx with lumpectomy and xrt  . CHF (congestive heart failure) ([facility])   . Constipation   . COPD (chronic obstructive  pulmonary disease) ([facility])    pt.unsure of diagnosis status  . Emphysema of lung ([place])    pt. questions diagnosis  . Family history of adverse reaction to anesthesia    My Sister has nausea  . Family history of breast cancer   . Family history of colon cancer   . Family history of lung cancer   . GERD (gastroesophageal reflux disease)    not presently having symptoms  . Hypertension   . Lung cancer ([facility]) 06/06/05   stage 1 poorly differentiated adenocarcinoma, s/p right lower lobectomy.  . Osteoporosis   . Personal history of lung cancer   . Pneumonia    2007ish, walking pnemonia - 2009 ish  . STD (sexually transmitted disease)    HSV    Past Surgical History:  Procedure Laterality Date  . BREAST BIOPSY    . BREAST LUMPECTOMY  10/12/2009   Left lumpectomy and radiation, stage II, ER/PR+, Her 2 nu negative  . BUNIONECTOMY Bilateral   . COLONOSCOPY    . COLONOSCOPY    . LOBECTOMY Right 06/06/2005   [place]  . MASTECTOMY W/ SENTINEL NODE BIOPSY Bilateral 03/15/2018  . MASTECTOMY W/ SENTINEL NODE BIOPSY Bilateral 03/15/2018   Procedure: BILATERAL TOTAL MASTECTOMIES WITH RIGHT SENTINEL LYMPH NODE BIOPSY;  Surgeon: Rolm Bookbinder, MD;  Location: [facility];  Service: General;  Laterality: Bilateral;  . PORTACATH PLACEMENT N/A 03/15/2018   Procedure: INSERTION PORT-A-CATH WITH [place];  Surgeon: Rolm Bookbinder, MD;  Location: [facility];  Service: General;  Laterality: N/A;  . TONSILLECTOMY    . TUBAL LIGATION  1984  .  VIDEO BRONCHOSCOPY  02/06/2019   Flexible video fiberoptic bronchoscopy with electromagnetic navigation and biopsies.    Family History  Problem Relation Age of Onset  . Allergies Mother   . Asthma Mother   . Lung cancer Mother   . Breast cancer Mother 14       recurrence age 61  . Colon cancer Father 50  . Prostate cancer Brother 25  . Breast cancer Sister 72       Recurrence age 48 BRCA negative  . Colon polyps Sister   . Leukemia Sister   . Breast cancer Sister 70   . Prostate cancer Brother 55  . Breast cancer Maternal Grandmother 45  . Colon cancer Maternal Aunt   . Leukemia Maternal Grandfather   . Lung cancer Maternal Aunt   . Breast cancer Cousin   . Esophageal cancer Neg Hx   . Rectal cancer Neg Hx   . Stomach cancer Neg Hx    Social History:  reports that she quit smoking about 31 years ago. She has a 36.00 pack-year smoking history. She has never used smokeless tobacco. She reports current alcohol use of about 14.0 standard drinks of alcohol per week. She reports that she does not use drugs.  Allergies:  Allergies  Allergen Reactions  . Taxol [Paclitaxel] Anaphylaxis  . Tape Itching  . Abraxane [Paclitaxel Protein-Bound Part] Rash  . Clarithromycin Rash    Medications Prior to Admission  Medication Sig Dispense Refill  . calcium-vitamin D (OSCAL WITH D) 500-200 MG-UNIT tablet Take 1 tablet by mouth daily.    . carvedilol (COREG) 6.25 MG tablet Take 1 tablet (6.25 mg total) by mouth 2 (two) times daily. 180 tablet 3  . cholecalciferol (VITAMIN D) 1000 UNITS tablet Take 1,000 Units by mouth daily.    Marland Kitchen losartan (COZAAR) 25 MG tablet Take 1 tablet (25 mg total) by mouth 2 (two) times a day. 60 tablet 6  . Psyllium (METAMUCIL PO) Take 1 Scoop by mouth daily.     . tamoxifen (NOLVADEX) 20 MG tablet Take 20 mg by mouth daily.    . vitamin C (ASCORBIC ACID) 500 MG tablet Take 500 mg by mouth daily.    . [DISCONTINUED] acyclovir (ZOVIRAX) 400 MG tablet TAKE 1 TABLET BY MOUTH TWICE A DAY (Patient taking differently: Take 400 mg by mouth See admin instructions. For breakouts take 452m by mouth twice a day.) 90 tablet 3  . lidocaine-prilocaine (EMLA) cream Apply 1 application topically as needed. 30 g 0    Results for orders placed or performed during the hospital encounter of 02/06/19 (from the past 48 hour(s))  Culture, respiratory     Status: None (Preliminary result)   Collection Time: 02/06/19 10:49 AM   Specimen: Bronchial Alveolar  Lavage; Respiratory  Result Value Ref Range   Specimen Description BRONCHIAL ALVEOLAR LAVAGE RIGHT UPPER LUNG    Special Requests NONE    Gram Stain      RARE WBC PRESENT, PREDOMINANTLY PMN NO ORGANISMS SEEN Performed at [facility] [address] [place] [address]   Culture PENDING    Report Status PENDING   Culture, respiratory     Status: None (Preliminary result)   Collection Time: 02/06/19 11:31 AM   Specimen: Bronchial Washing, Left; Respiratory  Result Value Ref Range   Specimen Description BRONCHIAL WASHINGS LEFT LINGULA    Special Requests NONE    Gram Stain      RARE WBC PRESENT, PREDOMINANTLY MONONUCLEAR NO  ORGANISMS SEEN Performed at [facility], [place] [address] [place], [place] [address]    Culture PENDING    Report Status PENDING   CBC     Status: Abnormal   Collection Time: 02/06/19  2:38 PM  Result Value Ref Range   WBC 7.0 4.0 - 10.5 K/uL   RBC 3.73 (L) 3.87 - 5.11 MIL/uL   Hemoglobin 11.8 (L) 12.0 - 15.0 g/dL   HCT 36.0 36.0 - 46.0 %   MCV 96.5 80.0 - 100.0 fL   MCH 31.6 26.0 - 34.0 pg   MCHC 32.8 30.0 - 36.0 g/dL   RDW 12.1 11.5 - 15.5 %   Platelets 232 150 - 400 K/uL   nRBC 0.0 0.0 - 0.2 %    Comment: Performed at [facility], [place] [address], [place], [place] [address]  Magnesium     Status: None   Collection Time: 02/06/19  2:38 PM  Result Value Ref Range   Magnesium 2.1 1.7 - 2.4 mg/dL    Comment: Performed at [facility], [place] [address] [place], [place] [address]  Basic metabolic panel     Status: Abnormal   Collection Time: 02/06/19  2:38 PM  Result Value Ref Range   Sodium 135 135 - 145 mmol/L   Potassium 4.3 3.5 - 5.1 mmol/L   Chloride 104 98 - 111 mmol/L   CO2 27 22 - 32 mmol/L   Glucose, Bld 126 (H) 70 - 99 mg/dL   BUN 11 8 - 23 mg/dL   Creatinine, Ser 0.59 0.44 - 1.00 mg/dL   Calcium 8.6 (L) 8.9 - 10.3 mg/dL   GFR calc non Af Amer >60 >60 mL/min   GFR calc Af Amer >60 >60 mL/min   Anion gap 4  (L) 5 - 15    Comment: Performed at [facility], [place] [address] [place], [place] [address]  Phosphorus     Status: None   Collection Time: 02/06/19  2:38 PM  Result Value Ref Range   Phosphorus 3.2 2.5 - 4.6 mg/dL    Comment: Performed at [facility] [address], [place], [place] [address]   Dg Chest Port 1 View  Result Date: 02/06/2019 CLINICAL DATA:  Right-sided chest tube status post multiple biopsies EXAM: PORTABLE CHEST 1 VIEW COMPARISON:  02/06/2019 FINDINGS: Decreased size of the left pneumothorax measuring approximately 10%. Right-sided chest tube. Severe right lung volume loss with associated postsurgical changes and atelectasis. Chronic interstitial lung disease. No significant pleural effusion. Stable cardiomediastinal silhouette. No aggressive osseous lesion. IMPRESSION: Interval decrease in the left pneumothorax measuring approximately 10%. Right-sided chest tube. Severe right lung volume loss with associated postsurgical changes and atelectasis. Electronically Signed   By: Kathreen Devoid   On: 02/06/2019 13:17   Dg Chest Port 1 View  Result Date: 02/06/2019 CLINICAL DATA:  Status post bronchoscopy with biopsy. EXAM: PORTABLE CHEST 1 VIEW COMPARISON:  CT 02/01/2019.  Chest x-ray 12/14/2013. FINDINGS: PowerPort catheter stable position. Heart size stable. Severe tension pneumothorax on the left noted. Persistent atelectasis/infiltrate right lung base with volume loss. Persistent right-sided pleural thickening. Postsurgical changes right chest. IMPRESSION: Severe tension pneumothorax on the left. Critical Value/emergent results were called by telephone at the time of interpretation on 02/06/2019 at 12:29 pm to nurse Jerene Pitch , who verbally acknowledged these results. Electronically Signed   By: Marcello Moores  Register   On: 02/06/2019 12:32   Dg C-arm Bronchoscopy  Result Date: 02/06/2019 C-ARM  BRONCHOSCOPY: Fluoroscopy was utilized by the requesting physician.  No radiographic  interpretation.    ROS  Blood pressure (!) 177/84, pulse 68, temperature 97.9 F (36.6 C), temperature source Oral, resp. rate 16, height _0  (1.651 m), weight 53.1 kg, SpO2 94 %. Physical Exam  Thin, adult female, sitting in bed, no distress  HEENT MMM  Lungs distant, no wheezing, clear Heart regular without a murmur. Abdomen soft, nontender with positive bowel sounds Extremities without any significant edema Neurologically awake, alert, following commands    Assessment/Plan  Left Side Pneumothorax s/p Bronchoscopy/Biopsy of Pulmonary Nodules  H/O Tobacco Use, Adenocarcinoma s/p right lower lobe lobectomy  Plan -Chest Tube to -20 Suction  -Trend CXR > Repeat in AM  -Maintain Oxygen Saturation >92  -Pulmonary Hygiene   Acute Pain  Plan -Tramadol, Oxycodone PRN   CAD, HLD, HTN Pulmonary HTN > Followed by Dr. Aundra Dubin  -Most Recent EF >75% Plan  -Continue home Coreg, Cozaar  H/O Breast CA s/p bilateral mastectomy, Left in 2011, Right 2019 > Immuotherapy by Dr. Jana Hakim  Initially for paclitaxel x 12 cycles and Herceptin x 1 year.  Intolerant of paclitaxel, switched to [place], AGACNP-BC [facility]  Pgr: [phone]  PCCM Pgr: [phone]

## 2019-02-07 ENCOUNTER — Inpatient Hospital Stay (HOSPITAL_COMMUNITY): Payer: Medicare Other

## 2019-02-07 ENCOUNTER — Encounter (HOSPITAL_COMMUNITY): Payer: Self-pay | Admitting: Emergency Medicine

## 2019-02-07 LAB — CBC
HCT: 33.6 % — ABNORMAL LOW (ref 36.0–46.0)
Hemoglobin: 10.9 g/dL — ABNORMAL LOW (ref 12.0–15.0)
MCH: 31.4 pg (ref 26.0–34.0)
MCHC: 32.4 g/dL (ref 30.0–36.0)
MCV: 96.8 fL (ref 80.0–100.0)
Platelets: 240 10*3/uL (ref 150–400)
RBC: 3.47 MIL/uL — ABNORMAL LOW (ref 3.87–5.11)
RDW: 12.2 % (ref 11.5–15.5)
WBC: 7 10*3/uL (ref 4.0–10.5)
nRBC: 0 % (ref 0.0–0.2)

## 2019-02-07 LAB — ACID FAST SMEAR (AFB, MYCOBACTERIA): Acid Fast Smear: NEGATIVE

## 2019-02-07 LAB — BASIC METABOLIC PANEL
Anion gap: 6 (ref 5–15)
BUN: 11 mg/dL (ref 8–23)
CO2: 28 mmol/L (ref 22–32)
Calcium: 8.7 mg/dL — ABNORMAL LOW (ref 8.9–10.3)
Chloride: 102 mmol/L (ref 98–111)
Creatinine, Ser: 0.75 mg/dL (ref 0.44–1.00)
GFR calc Af Amer: >60 mL/min (ref >60)
GFR calc non Af Amer: >60 mL/min (ref >60)
Glucose, Bld: 140 mg/dL — ABNORMAL HIGH (ref 70–99)
Potassium: 4.6 mmol/L (ref 3.5–5.1)
Sodium: 136 mmol/L (ref 135–145)

## 2019-02-07 LAB — PHOSPHORUS: Phosphorus: 3.5 mg/dL (ref 2.5–4.6)

## 2019-02-07 LAB — MAGNESIUM: Magnesium: 2 mg/dL (ref 1.7–2.4)

## 2019-02-07 LAB — HIV ANTIBODY (ROUTINE TESTING W REFLEX): HIV Screen 4th Generation wRfx: NONREACTIVE

## 2019-02-07 MED ORDER — DOCUSATE SODIUM 100 MG PO CAPS
100.0000 mg | ORAL_CAPSULE | Freq: Every day | ORAL | Status: DC
  Administered 2019-02-07 – 2019-02-10 (×4): 100 mg via ORAL
  Filled 2019-02-07 (×4): qty 1

## 2019-02-07 MED ORDER — SENNOSIDES-DOCUSATE SODIUM 8.6-50 MG PO TABS
2.0000 | ORAL_TABLET | Freq: Two times a day (BID) | ORAL | Status: DC | PRN
  Administered 2019-02-09 – 2019-02-10 (×2): 2 via ORAL
  Filled 2019-02-07 (×2): qty 2

## 2019-02-07 NOTE — Progress Notes (Signed)
Anna Valentine is an 68 y.o. female.   Chief Complaint: Pulmonary nodules HPI:  68 year old former smoker with a history of p53 positivity, adenocarcinoma of the right lower lobe status post right lower lobectomy.  Also with a history of bilateral breast cancer, most recently treated with immunotherapy managed by Dr. Jana Hakim.  At the time of her lobectomy she also had pulmonary nodular disease in the right upper lobe, right middle lobe.  Wedge resections at the time of her lobectomy showed necrotizing granulomatous disease.  She never grew out Mycobacterium or any organism.  She is been followed for waxing and waning pulmonary nodular disease with serial imaging.  A restaging PET scan done on 01/18/2019 revealed 2 new right-sided pulmonary nodules and a lingular nodule.  All 3 of these were hypermetabolic, more so on the right than in the lingula.  This of course prompted concern about possible metastatic disease, recurrent primary lung cancer, possible atypical or opportunistic infection.  Recommendation was made to achieve a tissue diagnosis, obtain culture data via navigational bronchoscopy.  Patient presents today for further evaluation and bronchoscopy as above.  She denies any new problems.  Her cough is under good control.  Minimal sputum production.  Review of her CT scan of the chest from 7/25 shows that the 2 right-sided nodules have actually resolved in the interim from 7/10.  Discussed this with her, certainly most consistent with an inflammatory or possibly infectious process.  The lingular nodule is unchanged and remain suspicious. Postoperatively developed left side pneumothorax, cook-catheter placed. Will admit to med-surg bed.    Past Medical History:  Diagnosis Date  . Anemia   . BRCA negative 2011   BRCA I/ II negative  . Breast cancer ([facility]) 08/2009   stage 2, rx with lumpectomy and xrt  . CHF (congestive heart failure) ([facility])   . Constipation   . COPD (chronic obstructive  pulmonary disease) ([facility])    pt.unsure of diagnosis status  . Emphysema of lung ([facility])    pt. questions diagnosis  . Family history of adverse reaction to anesthesia    My Sister has nausea  . Family history of breast cancer   . Family history of colon cancer   . Family history of lung cancer   . GERD (gastroesophageal reflux disease)    not presently having symptoms  . Hypertension   . Lung cancer ([facility]) 06/06/05   stage 1 poorly differentiated adenocarcinoma, s/p right lower lobectomy.  . Osteoporosis   . Personal history of lung cancer   . Pneumonia    2007ish, walking pnemonia - 2009 ish  . STD (sexually transmitted disease)    HSV    Past Surgical History:  Procedure Laterality Date  . BREAST BIOPSY    . BREAST LUMPECTOMY  10/12/2009   Left lumpectomy and radiation, stage II, ER/PR+, Her 2 nu negative  . BUNIONECTOMY Bilateral   . COLONOSCOPY    . COLONOSCOPY    . LOBECTOMY Right 06/06/2005   [place]  . MASTECTOMY W/ SENTINEL NODE BIOPSY Bilateral 03/15/2018  . MASTECTOMY W/ SENTINEL NODE BIOPSY Bilateral 03/15/2018   Procedure: BILATERAL TOTAL MASTECTOMIES WITH RIGHT SENTINEL LYMPH NODE BIOPSY;  Surgeon: Rolm Bookbinder, MD;  Location: [facility];  Service: General;  Laterality: Bilateral;  . PORTACATH PLACEMENT N/A 03/15/2018   Procedure: INSERTION PORT-A-CATH WITH [place];  Surgeon: Rolm Bookbinder, MD;  Location: [facility];  Service: General;  Laterality: N/A;  . TONSILLECTOMY    . TUBAL LIGATION  1984  .  VIDEO BRONCHOSCOPY  02/06/2019   Flexible video fiberoptic bronchoscopy with electromagnetic navigation and biopsies.  Marland Kitchen VIDEO BRONCHOSCOPY WITH ENDOBRONCHIAL NAVIGATION Left 02/06/2019   Procedure: VIDEO BRONCHOSCOPY WITH ENDOBRONCHIAL NAVIGATION, left lung;  Surgeon: Collene Gobble, MD;  Location: [facility];  Service: Thoracic;  Laterality: Left;    Family History  Problem Relation Age of Onset  . Allergies Mother   . Asthma Mother   . Lung cancer Mother   . Breast cancer  Mother 50       recurrence age 48  . Colon cancer Father 37  . Prostate cancer Brother 25  . Breast cancer Sister 59       Recurrence age 6 BRCA negative  . Colon polyps Sister   . Leukemia Sister   . Breast cancer Sister 72  . Prostate cancer Brother 75  . Breast cancer Maternal Grandmother 49  . Colon cancer Maternal Aunt   . Leukemia Maternal Grandfather   . Lung cancer Maternal Aunt   . Breast cancer Cousin   . Esophageal cancer Neg Hx   . Rectal cancer Neg Hx   . Stomach cancer Neg Hx    Social History:  reports that she quit smoking about 31 years ago. She has a 36.00 pack-year smoking history. She has never used smokeless tobacco. She reports current alcohol use of about 14.0 standard drinks of alcohol per week. She reports that she does not use drugs.  Allergies:  Allergies  Allergen Reactions  . Taxol [Paclitaxel] Anaphylaxis  . Tape Itching  . Abraxane [Paclitaxel Protein-Bound Part] Rash  . Clarithromycin Rash    Medications Prior to Admission  Medication Sig Dispense Refill  . calcium-vitamin D (OSCAL WITH D) 500-200 MG-UNIT tablet Take 1 tablet by mouth daily.    . carvedilol (COREG) 6.25 MG tablet Take 1 tablet (6.25 mg total) by mouth 2 (two) times daily. 180 tablet 3  . cholecalciferol (VITAMIN D) 1000 UNITS tablet Take 1,000 Units by mouth daily.    Marland Kitchen losartan (COZAAR) 25 MG tablet Take 1 tablet (25 mg total) by mouth 2 (two) times a day. 60 tablet 6  . Psyllium (METAMUCIL PO) Take 1 Scoop by mouth daily.     . tamoxifen (NOLVADEX) 20 MG tablet Take 20 mg by mouth daily.    . vitamin C (ASCORBIC ACID) 500 MG tablet Take 500 mg by mouth daily.    . [DISCONTINUED] acyclovir (ZOVIRAX) 400 MG tablet TAKE 1 TABLET BY MOUTH TWICE A DAY (Patient taking differently: Take 400 mg by mouth See admin instructions. For breakouts take '400mg'$  by mouth twice a day.) 90 tablet 3  . lidocaine-prilocaine (EMLA) cream Apply 1 application topically as needed. 30 g 0    Results  for orders placed or performed during the hospital encounter of 02/06/19 (from the past 48 hour(s))  Culture, respiratory     Status: None (Preliminary result)   Collection Time: 02/06/19 10:49 AM   Specimen: Bronchial Alveolar Lavage; Respiratory  Result Value Ref Range   Specimen Description BRONCHIAL ALVEOLAR LAVAGE RIGHT UPPER LUNG    Special Requests NONE    Gram Stain      RARE WBC PRESENT, PREDOMINANTLY PMN NO ORGANISMS SEEN    Culture      FEW PSEUDOMONAS AERUGINOSA SUSCEPTIBILITIES TO FOLLOW CULTURE REINCUBATED FOR BETTER GROWTH Performed at [facility], [place] [address], [place], [place] [address]    Report Status PENDING   Culture, respiratory     Status: None (Preliminary  result)   Collection Time: 02/06/19 11:31 AM   Specimen: Bronchial Washing, Left; Respiratory  Result Value Ref Range   Specimen Description BRONCHIAL WASHINGS LEFT LINGULA    Special Requests NONE    Gram Stain      RARE WBC PRESENT, PREDOMINANTLY MONONUCLEAR NO ORGANISMS SEEN    Culture      NO GROWTH 1 DAY Performed at [facility], [address], [place], [place] [address]    Report Status PENDING   HIV antibody (Routine Testing)     Status: None   Collection Time: 02/06/19  2:38 PM  Result Value Ref Range   HIV Screen 4th Generation wRfx Non Reactive Non Reactive    Comment: (NOTE) Performed At: [facility] [place], [place] [id number] Rush Farmer MD [id number]   CBC     Status: Abnormal   Collection Time: 02/06/19  2:38 PM  Result Value Ref Range   WBC 7.0 4.0 - 10.5 K/uL   RBC 3.73 (L) 3.87 - 5.11 MIL/uL   Hemoglobin 11.8 (L) 12.0 - 15.0 g/dL   HCT 36.0 36.0 - 46.0 %   MCV 96.5 80.0 - 100.0 fL   MCH 31.6 26.0 - 34.0 pg   MCHC 32.8 30.0 - 36.0 g/dL   RDW 12.1 11.5 - 15.5 %   Platelets 232 150 - 400 K/uL   nRBC 0.0 0.0 - 0.2 %    Comment: Performed at [facility], [place] [address] [place], [place] [address]  Magnesium     Status:  None   Collection Time: 02/06/19  2:38 PM  Result Value Ref Range   Magnesium 2.1 1.7 - 2.4 mg/dL    Comment: Performed at [facility], [place] [address], [place], [place] [address]  Basic metabolic panel     Status: Abnormal   Collection Time: 02/06/19  2:38 PM  Result Value Ref Range   Sodium 135 135 - 145 mmol/L   Potassium 4.3 3.5 - 5.1 mmol/L   Chloride 104 98 - 111 mmol/L   CO2 27 22 - 32 mmol/L   Glucose, Bld 126 (H) 70 - 99 mg/dL   BUN 11 8 - 23 mg/dL   Creatinine, Ser 0.59 0.44 - 1.00 mg/dL   Calcium 8.6 (L) 8.9 - 10.3 mg/dL   GFR calc non Af Amer >60 >60 mL/min   GFR calc Af Amer >60 >60 mL/min   Anion gap 4 (L) 5 - 15    Comment: Performed at [facility], [place] [address], [place], [place] [address]  Phosphorus     Status: None   Collection Time: 02/06/19  2:38 PM  Result Value Ref Range   Phosphorus 3.2 2.5 - 4.6 mg/dL    Comment: Performed at [facility] [address] [place], [place] [address]  Basic metabolic panel     Status: Abnormal   Collection Time: 02/07/19  1:54 AM  Result Value Ref Range   Sodium 136 135 - 145 mmol/L   Potassium 4.6 3.5 - 5.1 mmol/L   Chloride 102 98 - 111 mmol/L   CO2 28 22 - 32 mmol/L   Glucose, Bld 140 (H) 70 - 99 mg/dL   BUN 11 8 - 23 mg/dL   Creatinine, Ser 0.75 0.44 - 1.00 mg/dL   Calcium 8.7 (L) 8.9 - 10.3 mg/dL   GFR calc non Af Amer >60 >60 mL/min   GFR calc Af Amer >60 >60 mL/min  Anion gap 6 5 - 15    Comment: Performed at [facility] [address], [place], [place] [address]  CBC     Status: Abnormal   Collection Time: 02/07/19  1:54 AM  Result Value Ref Range   WBC 7.0 4.0 - 10.5 K/uL   RBC 3.47 (L) 3.87 - 5.11 MIL/uL   Hemoglobin 10.9 (L) 12.0 - 15.0 g/dL   HCT 33.6 (L) 36.0 - 46.0 %   MCV 96.8 80.0 - 100.0 fL   MCH 31.4 26.0 - 34.0 pg   MCHC 32.4 30.0 - 36.0 g/dL   RDW 12.2 11.5 - 15.5 %   Platelets 240 150 - 400 K/uL   nRBC 0.0 0.0 - 0.2 %    Comment: Performed at [facility], [place] [address] [place], [place] [address]  Magnesium     Status: None   Collection Time: 02/07/19  1:54 AM  Result Value Ref Range   Magnesium 2.0 1.7 - 2.4 mg/dL    Comment: Performed at [facility] [address] [place], [place] [address]  Phosphorus     Status: None   Collection Time: 02/07/19  1:54 AM  Result Value Ref Range   Phosphorus 3.5 2.5 - 4.6 mg/dL    Comment: Performed at [facility] [address] [place], [place] [address]   Dg Chest Port 1 View  Result Date: 02/07/2019 CLINICAL DATA:  Chest tube surveillance EXAM: PORTABLE CHEST 1 VIEW COMPARISON:  02/06/2019 FINDINGS: Right-sided chest port and thoracostomy tube remain unchanged in positioning. Redemonstrated volume loss and chronic postsurgical changes within the right lung. Persistent small left apical pneumothorax measuring approximately 10%, not significantly changed compared to prior. Chronic blunting of the bilateral costophrenic angles. IMPRESSION: No significant change in size of a small left pneumothorax. Electronically Signed   By: Davina Poke M.D.   On: 02/07/2019 09:36   Dg Chest Port 1 View  Result Date: 02/06/2019 CLINICAL DATA:  Chest tube placement.  Left-sided chest pain. EXAM: PORTABLE CHEST 1 VIEW COMPARISON:  Multiple exams same day FINDINGS: Left chest tube is in place, crossing the midline to the right. This is an unusual appearance and it may be within a left pleural space recess given the chronic volume loss on the right. Left pneumothorax remains smaller than on the initial diagnosis, 10% or less. No tension. Chronic pleural and parenchymal scarring in the right chest. Power port inserted from a right internal jugular approach has its tip in the SVC above the right atrium. IMPRESSION: Left-sided chest tube appears to cross the midline, possibly within a left pleural space recess given the chronic volume loss on the right. Left pneumothorax remains small, 10% or less.  Electronically Signed   By: Nelson Chimes M.D.   On: 02/06/2019 16:48   Dg Chest Port 1 View  Result Date: 02/06/2019 CLINICAL DATA:  Right-sided chest tube status post multiple biopsies EXAM: PORTABLE CHEST 1 VIEW COMPARISON:  02/06/2019 FINDINGS: Decreased size of the left pneumothorax measuring approximately 10%. Right-sided chest tube. Severe right lung volume loss with associated postsurgical changes and atelectasis. Chronic interstitial lung disease. No significant pleural effusion. Stable cardiomediastinal silhouette. No aggressive osseous lesion. IMPRESSION: Interval decrease in the left pneumothorax measuring approximately 10%. Right-sided chest tube. Severe right lung volume loss with associated postsurgical changes and atelectasis. Electronically Signed   By: Kathreen Devoid   On: 02/06/2019 13:17   Dg Chest Port 1 View  Result  Date: 02/06/2019 CLINICAL DATA:  Status post bronchoscopy with biopsy. EXAM: PORTABLE CHEST 1 VIEW COMPARISON:  CT 02/01/2019.  Chest x-ray 12/14/2013. FINDINGS: PowerPort catheter stable position. Heart size stable. Severe tension pneumothorax on the left noted. Persistent atelectasis/infiltrate right lung base with volume loss. Persistent right-sided pleural thickening. Postsurgical changes right chest. IMPRESSION: Severe tension pneumothorax on the left. Critical Value/emergent results were called by telephone at the time of interpretation on 02/06/2019 at 12:29 pm to nurse Jerene Pitch , who verbally acknowledged these results. Electronically Signed   By: Marcello Moores  Register   On: 02/06/2019 12:32   Dg C-arm Bronchoscopy  Result Date: 02/06/2019 C-ARM BRONCHOSCOPY: Fluoroscopy was utilized by the requesting physician.  No radiographic interpretation.    Blood pressure 109/68, pulse 73, temperature 98.3 F (36.8 C), temperature source Oral, resp. rate 15, height '5\' 5"'$  (1.651 m), weight 53.1 kg, SpO2 99 %. Physical Exam  Thin, adult female, sitting in bed, no distress  HEENT  MMM Lungs distant, no wheezing, clear, chest tube in place to left upper chest  Heart regular without a murmur. Abdomen soft, nontender with positive bowel sounds Extremities without any significant edema Neurologically awake, alert, following commands    Assessment/Plan  Left Side Pneumothorax s/p Bronchoscopy/Biopsy of Pulmonary Nodules  H/O Tobacco Use, Adenocarcinoma s/p right lower lobe lobectomy  Plan -Patient without air leak. when taken off suctioned complained of chest pain and SOB > placed on -10 suction with improvement > repeat CXR ordered  -Trend CXR > Repeat in AM  -Maintain Oxygen Saturation >92  -Pulmonary Hygiene   Acute Pain  Plan -Morphine, Oxycodone PRN   CAD, HLD, HTN Pulmonary HTN > Followed by Dr. Aundra Dubin  -Most Recent EF >75% Plan  -Continue home Coreg, Cozaar  H/O Breast CA s/p bilateral mastectomy, Left in 2011, Right 2019 > Immuotherapy by Dr. Jana Hakim  Initially for paclitaxel x 12 cycles and Herceptin x 1 year.  Intolerant of paclitaxel, switched to abraxane Plan  -Continue Tamoxifen   Constipation  Plan -Added colace and PRN Senna  Hayden Pedro, AGACNP-BC [facility]  Pgr: [phone]  PCCM Pgr: [phone]

## 2019-02-08 ENCOUNTER — Inpatient Hospital Stay (HOSPITAL_COMMUNITY): Payer: Medicare Other

## 2019-02-08 LAB — CBC
HCT: 31.6 % — ABNORMAL LOW (ref 36.0–46.0)
Hemoglobin: 10.3 g/dL — ABNORMAL LOW (ref 12.0–15.0)
MCH: 31.9 pg (ref 26.0–34.0)
MCHC: 32.6 g/dL (ref 30.0–36.0)
MCV: 97.8 fL (ref 80.0–100.0)
Platelets: 204 10*3/uL (ref 150–400)
RBC: 3.23 MIL/uL — ABNORMAL LOW (ref 3.87–5.11)
RDW: 12.4 % (ref 11.5–15.5)
WBC: 5.2 10*3/uL (ref 4.0–10.5)
nRBC: 0 % (ref 0.0–0.2)

## 2019-02-08 LAB — MAGNESIUM: Magnesium: 2 mg/dL (ref 1.7–2.4)

## 2019-02-08 LAB — BASIC METABOLIC PANEL
Anion gap: 7 (ref 5–15)
BUN: 10 mg/dL (ref 8–23)
CO2: 25 mmol/L (ref 22–32)
Calcium: 8.6 mg/dL — ABNORMAL LOW (ref 8.9–10.3)
Chloride: 105 mmol/L (ref 98–111)
Creatinine, Ser: 0.64 mg/dL (ref 0.44–1.00)
GFR calc Af Amer: >60 mL/min (ref >60)
GFR calc non Af Amer: >60 mL/min (ref >60)
Glucose, Bld: 95 mg/dL (ref 70–99)
Potassium: 4 mmol/L (ref 3.5–5.1)
Sodium: 137 mmol/L (ref 135–145)

## 2019-02-08 LAB — CULTURE, RESPIRATORY W GRAM STAIN: Culture: NO GROWTH

## 2019-02-08 LAB — PHOSPHORUS: Phosphorus: 3.3 mg/dL (ref 2.5–4.6)

## 2019-02-08 NOTE — Progress Notes (Addendum)
eLink Physician-Brief Progress Note Patient Name: Anna Valentine DOB: 14-Jun-1951 MRN: [id number]   Date of Service  02/08/2019  HPI/Events of Note  Patient c/o SOB/Anxiety. Hx of L chest tube for L pneumothorax s/p bronchoscopy. Sat = 96% and RR = 18.   eICU Interventions  Will order: 1. Please do 5 AM CXR now.  If CXR reveals L lung to be fully expanded, will consider small dose of Xanax PO.      Intervention Category Major Interventions: Delirium, psychosis, severe agitation - evaluation and management  Sommer,Steven Eugene 02/08/2019, 1:55 AM

## 2019-02-08 NOTE — Progress Notes (Signed)
Anna Valentine is an 68 y.o. female.   Chief Complaint: Pulmonary nodules HPI:  67 year old former smoker with a history of p53 positivity, adenocarcinoma of the right lower lobe status post right lower lobectomy.  Also with a history of bilateral breast cancer, most recently treated with immunotherapy managed by Dr. Jana Hakim.  At the time of her lobectomy she also had pulmonary nodular disease in the right upper lobe, right middle lobe.  Wedge resections at the time of her lobectomy showed necrotizing granulomatous disease.  She never grew out Mycobacterium or any organism.  She is been followed for waxing and waning pulmonary nodular disease with serial imaging.  A restaging PET scan done on 01/18/2019 revealed 2 new right-sided pulmonary nodules and a lingular nodule.  All 3 of these were hypermetabolic, more so on the right than in the lingula.  This of course prompted concern about possible metastatic disease, recurrent primary lung cancer, possible atypical or opportunistic infection.  Recommendation was made to achieve a tissue diagnosis, obtain culture data via navigational bronchoscopy.  Patient presents today for further evaluation and bronchoscopy as above.  She denies any new problems.  Her cough is under good control.  Minimal sputum production.  Review of her CT scan of the chest from 7/25 shows that the 2 right-sided nodules have actually resolved in the interim from 7/10.  Discussed this with her, certainly most consistent with an inflammatory or possibly infectious process.  The lingular nodule is unchanged and remain suspicious. Postoperatively developed left side pneumothorax, cook-catheter placed. Will admit to med-surg bed.    Past Surgical History:  Procedure Laterality Date  . BREAST BIOPSY    . BREAST LUMPECTOMY  10/12/2009   Left lumpectomy and radiation, stage II, ER/PR+, Her 2 nu negative  . BUNIONECTOMY Bilateral   . COLONOSCOPY    . COLONOSCOPY    . LOBECTOMY Right  06/06/2005   [place]  . MASTECTOMY W/ SENTINEL NODE BIOPSY Bilateral 03/15/2018  . MASTECTOMY W/ SENTINEL NODE BIOPSY Bilateral 03/15/2018   Procedure: BILATERAL TOTAL MASTECTOMIES WITH RIGHT SENTINEL LYMPH NODE BIOPSY;  Surgeon: Rolm Bookbinder, MD;  Location: [facility];  Service: General;  Laterality: Bilateral;  . PORTACATH PLACEMENT N/A 03/15/2018   Procedure: INSERTION PORT-A-CATH WITH [place];  Surgeon: Rolm Bookbinder, MD;  Location: [facility];  Service: General;  Laterality: N/A;  . TONSILLECTOMY    . TUBAL LIGATION  1984  . VIDEO BRONCHOSCOPY  02/06/2019   Flexible video fiberoptic bronchoscopy with electromagnetic navigation and biopsies.  Marland Kitchen VIDEO BRONCHOSCOPY WITH ENDOBRONCHIAL NAVIGATION Left 02/06/2019   Procedure: VIDEO BRONCHOSCOPY WITH ENDOBRONCHIAL NAVIGATION, left lung;  Surgeon: Collene Gobble, MD;  Location[facility];  Service: Thoracic;  Laterality: Left;    Family History  Problem Relation Age of Onset  . Allergies Mother   . Asthma Mother   . Lung cancer Mother   . Breast cancer Mother 29       recurrence age 50  . Colon cancer Father 52  . Prostate cancer Brother 95  . Breast cancer Sister 61       Recurrence age 69 BRCA negative  . Colon polyps Sister   . Leukemia Sister   . Breast cancer Sister 44  . Prostate cancer Brother 10  . Breast cancer Maternal Grandmother 65  . Colon cancer Maternal Aunt   . Leukemia Maternal Grandfather   . Lung cancer Maternal Aunt   . Breast cancer Cousin   . Esophageal cancer Neg Hx   .  Rectal cancer Neg Hx   . Stomach cancer Neg Hx    Social History:  reports that she quit smoking about 31 years ago. She has a 36.00 pack-year smoking history. She has never used smokeless tobacco. She reports current alcohol use of about 14.0 standard drinks of alcohol per week. She reports that she does not use drugs.  Allergies:  Allergies  Allergen Reactions  . Taxol [Paclitaxel] Anaphylaxis  . Tape Itching  . Abraxane [Paclitaxel Protein-Bound  Part] Rash  . Clarithromycin Rash    Medications Prior to Admission  Medication Sig Dispense Refill  . calcium-vitamin D (OSCAL WITH D) 500-200 MG-UNIT tablet Take 1 tablet by mouth daily.    . carvedilol (COREG) 6.25 MG tablet Take 1 tablet (6.25 mg total) by mouth 2 (two) times daily. 180 tablet 3  . cholecalciferol (VITAMIN D) 1000 UNITS tablet Take 1,000 Units by mouth daily.    Marland Kitchen losartan (COZAAR) 25 MG tablet Take 1 tablet (25 mg total) by mouth 2 (two) times a day. 60 tablet 6  . Psyllium (METAMUCIL PO) Take 1 Scoop by mouth daily.     . tamoxifen (NOLVADEX) 20 MG tablet Take 20 mg by mouth daily.    . vitamin C (ASCORBIC ACID) 500 MG tablet Take 500 mg by mouth daily.    . [DISCONTINUED] acyclovir (ZOVIRAX) 400 MG tablet TAKE 1 TABLET BY MOUTH TWICE A DAY (Patient taking differently: Take 400 mg by mouth See admin instructions. For breakouts take 425m by mouth twice a day.) 90 tablet 3  . lidocaine-prilocaine (EMLA) cream Apply 1 application topically as needed. 30 g 0    Results for orders placed or performed during the hospital encounter of 02/06/19 (from the past 48 hour(s))  HIV antibody (Routine Testing)     Status: None   Collection Time: 02/06/19  2:38 PM  Result Value Ref Range   HIV Screen 4th Generation wRfx Non Reactive Non Reactive    Comment: (NOTE) Performed At: [id number] FarmerMD PQM:[id number]  CBC     Status: Abnormal   Collection Time: 02/06/19  2:38 PM  Result Value Ref Range   WBC 7.0 4.0 - 10.5 K/uL   RBC 3.73 (L) 3.87 - 5.11 MIL/uL   Hemoglobin 11.8 (L) 12.0 - 15.0 g/dL   HCT 36.0 36.0 - 46.0 %   MCV 96.5 80.0 - 100.0 fL   MCH 31.6 26.0 - 34.0 pg   MCHC 32.8 30.0 - 36.0 g/dL   RDW 12.1 11.5 - 15.5 %   Platelets 232 150 - 400 K/uL   nRBC 0.0 0.0 - 0.2 %    Comment: Performed at [facility] [address] [place] [address] Magnesium     Status: None   Collection  Time: 02/06/19  2:38 PM  Result Value Ref Range   Magnesium 2.1 1.7 - 2.4 mg/dL    Comment: Performed at [facility] [address] [place] [id number] Basic metabolic panel     Status: Abnormal   Collection Time: 02/06/19  2:38 PM  Result Value Ref Range   Sodium 135 135 - 145 mmol/L   Potassium 4.3 3.5 - 5.1 mmol/L   Chloride 104 98 - 111 mmol/L   CO2 27 22 - 32 mmol/L   Glucose, Bld 126 (H) 70 - 99 mg/dL   BUN 11 8 - 23 mg/dL   Creatinine, Ser 0.59  0.44 - 1.00 mg/dL   Calcium 8.6 (L) 8.9 - 10.3 mg/dL   GFR calc non Af Amer >60 >60 mL/min   GFR calc Af Amer >60 >60 mL/min   Anion gap 4 (L) 5 - 15    Comment: Performed at [facility] [address] [place], [place] [address]  Phosphorus     Status: None   Collection Time: 02/06/19  2:38 PM  Result Value Ref Range   Phosphorus 3.2 2.5 - 4.6 mg/dL    Comment: Performed at [facility] [address] [place], [place] [address]  Basic metabolic panel     Status: Abnormal   Collection Time: 02/07/19  1:54 AM  Result Value Ref Range   Sodium 136 135 - 145 mmol/L   Potassium 4.6 3.5 - 5.1 mmol/L   Chloride 102 98 - 111 mmol/L   CO2 28 22 - 32 mmol/L   Glucose, Bld 140 (H) 70 - 99 mg/dL   BUN 11 8 - 23 mg/dL   Creatinine, Ser 0.75 0.44 - 1.00 mg/dL   Calcium 8.7 (L) 8.9 - 10.3 mg/dL   GFR calc non Af Amer >60 >60 mL/min   GFR calc Af Amer >60 >60 mL/min   Anion gap 6 5 - 15    Comment: Performed at [facility], [place] [address], [place], [place] [address]  CBC     Status: Abnormal   Collection Time: 02/07/19  1:54 AM  Result Value Ref Range   WBC 7.0 4.0 - 10.5 K/uL   RBC 3.47 (L) 3.87 - 5.11 MIL/uL   Hemoglobin 10.9 (L) 12.0 - 15.0 g/dL   HCT 33.6 (L) 36.0 - 46.0 %   MCV 96.8 80.0 - 100.0 fL   MCH 31.4 26.0 - 34.0 pg   MCHC 32.4 30.0 - 36.0 g/dL   RDW 12.2 11.5 - 15.5 %   Platelets 240 150 - 400 K/uL   nRBC 0.0 0.0 - 0.2 %    Comment: Performed at [facility], [place]  [address], [place], [place] [address]  Magnesium     Status: None   Collection Time: 02/07/19  1:54 AM  Result Value Ref Range   Magnesium 2.0 1.7 - 2.4 mg/dL    Comment: Performed at [facility] [address], [place], [place] [address]  Phosphorus     Status: None   Collection Time: 02/07/19  1:54 AM  Result Value Ref Range   Phosphorus 3.5 2.5 - 4.6 mg/dL    Comment: Performed at [facility] [address] [place], [place] [address]  Basic metabolic panel     Status: Abnormal   Collection Time: 02/08/19  6:41 AM  Result Value Ref Range   Sodium 137 135 - 145 mmol/L   Potassium 4.0 3.5 - 5.1 mmol/L   Chloride 105 98 - 111 mmol/L   CO2 25 22 - 32 mmol/L   Glucose, Bld 95 70 - 99 mg/dL   BUN 10 8 - 23 mg/dL   Creatinine, Ser 0.64 0.44 - 1.00 mg/dL   Calcium 8.6 (L) 8.9 - 10.3 mg/dL   GFR calc non Af Amer >60 >60 mL/min   GFR calc Af Amer >60 >60 mL/min   Anion gap 7 5 - 15    Comment: Performed at [facility], [place] [address], [place],  [address]  CBC     Status: Abnormal   Collection Time: 02/08/19  6:41 AM  Result Value Ref Range   WBC 5.2 4.0 - 10.5 K/uL   RBC 3.23 (L) 3.87 - 5.11 MIL/uL   Hemoglobin 10.3 (L) 12.0 - 15.0 g/dL   HCT 31.6 (L) 36.0 - 46.0 %   MCV 97.8 80.0 - 100.0 fL   MCH 31.9 26.0 - 34.0 pg   MCHC 32.6 30.0 - 36.0 g/dL   RDW 12.4 11.5 - 15.5 %   Platelets 204 150 - 400 K/uL   nRBC 0.0 0.0 - 0.2 %    Comment: Performed at [facility], [place] [address], [place], [place] [address]  Magnesium     Status: None   Collection Time: 02/08/19  6:41 AM  Result Value Ref Range   Magnesium 2.0 1.7 - 2.4 mg/dL    Comment: Performed at [facility] [address], [place], [place] [address]  Phosphorus     Status: None   Collection Time: 02/08/19  6:41 AM  Result Value Ref Range   Phosphorus 3.3 2.5 - 4.6 mg/dL    Comment: Performed at [facility] [address], [place], [place] [address]   Dg Chest Port 1  View  Result Date: 02/08/2019 CLINICAL DATA:  Pneumothorax EXAM: PORTABLE CHEST 1 VIEW COMPARISON:  02/07/2019 FINDINGS: Patient is rotated. Small to moderate left apical pneumothorax. Indwelling left chest pigtail tube. Postsurgical changes in the right hemithorax with underlying patchy opacities and volume loss, unchanged. Right chest power port terminates at the cavoatrial junction. The heart is normal in size with mild rightward cardiomediastinal shift. IMPRESSION: Stable small to moderate left apical pneumothorax with indwelling left tube. Stable appearance of the right hemithorax. Electronically Signed   By: Julian Hy M.D.   On: 02/08/2019 02:27   Dg Chest Port 1 View  Result Date: 02/07/2019 CLINICAL DATA:  Iatrogenic left pneumothorax due to bronchoscopy 02/06/2019. Chest tube in place. EXAM: PORTABLE CHEST 1 VIEW COMPARISON:  Single-view of the chest 02/07/2019 and 02/06/2019. FINDINGS: Marked volume loss in the right chest again seen. Pigtail catheter remains in place on the left. Left pneumothorax seen on yesterday's examination is unchanged with the apex of the left lung projecting between the left fourth and fifth ribs. Port-A-Cath again noted. IMPRESSION: No change in a left pneumothorax since yesterday's examination. Chest tube remains in place. No new abnormality. Electronically Signed   By: Inge Rise M.D.   On: 02/07/2019 16:30   Dg Chest Port 1 View  Result Date: 02/07/2019 CLINICAL DATA:  Chest tube surveillance EXAM: PORTABLE CHEST 1 VIEW COMPARISON:  02/06/2019 FINDINGS: Right-sided chest port and thoracostomy tube remain unchanged in positioning. Redemonstrated volume loss and chronic postsurgical changes within the right lung. Persistent small left apical pneumothorax measuring approximately 10%, not significantly changed compared to prior. Chronic blunting of the bilateral costophrenic angles. IMPRESSION: No significant change in size of a small left pneumothorax.  Electronically Signed   By: Davina Poke M.D.   On: 02/07/2019 09:36   Dg Chest Port 1 View  Result Date: 02/06/2019 CLINICAL DATA:  Chest tube placement.  Left-sided chest pain. EXAM: PORTABLE CHEST 1 VIEW COMPARISON:  Multiple exams same day FINDINGS: Left chest tube is in place, crossing the midline to the right. This is an unusual appearance and it may be within a left pleural space recess given the chronic volume loss on the right. Left pneumothorax remains smaller than on the initial diagnosis, 10% or less. No tension. Chronic pleural and parenchymal scarring  in the right chest. Power port inserted from a right internal jugular approach has its tip in the SVC above the right atrium. IMPRESSION: Left-sided chest tube appears to cross the midline, possibly within a left pleural space recess given the chronic volume loss on the right. Left pneumothorax remains small, 10% or less. Electronically Signed   By: Nelson Chimes M.D.   On: 02/06/2019 16:48   Dg Chest Port 1 View  Result Date: 02/06/2019 CLINICAL DATA:  Right-sided chest tube status post multiple biopsies EXAM: PORTABLE CHEST 1 VIEW COMPARISON:  02/06/2019 FINDINGS: Decreased size of the left pneumothorax measuring approximately 10%. Right-sided chest tube. Severe right lung volume loss with associated postsurgical changes and atelectasis. Chronic interstitial lung disease. No significant pleural effusion. Stable cardiomediastinal silhouette. No aggressive osseous lesion. IMPRESSION: Interval decrease in the left pneumothorax measuring approximately 10%. Right-sided chest tube. Severe right lung volume loss with associated postsurgical changes and atelectasis. Electronically Signed   By: Kathreen Devoid   On: 02/06/2019 13:17    Blood pressure 136/70, pulse 62, temperature 97.6 F (36.4 C), temperature source Oral, resp. rate 18, height _0  (1.651 m), weight 53.1 kg, SpO2 97 %. Physical Exam  Thin, adult female, sitting in chair, no  distress HEENT  NCAT, MMM, No LAD, Trachea midline Lungs Bilateral chest excursion, BS distant but clear, no wheezing,  chest tube in place to left upper chest with a 2/7 leak noted on 10 cm suction Heart S1, S2, RRR , No RMG Abdomen soft, nontender, ND  + BS Extremities , no obvious deformity, without any significant edema Neurologically awake, alert, following commands , appropriate   Assessment/Plan  Left Side Pneumothorax s/p Bronchoscopy/Biopsy of Pulmonary Nodules  H/O Tobacco Use, Adenocarcinoma s/p right lower lobe lobectomy  Plan -Patient without air leak. when taken off suctioned complained of chest pain and SOB > placed on -10 suction with improvement > repeat CXR ordered  - 7/31 awoke from sleep with worsening shortness of breath>> CXR stable small to moderate L apical pneumothorax, CT in place 2/7 chamber leak noted 7/31 Plan: -Trend CXR > Repeat 02/09/2019 -Maintain Oxygen Saturation >92  -Pulmonary Hygiene  - Will increase CT  suction to 20 cm H2O - Add continuous oxygen at 2 L  as tolerated   Acute Pain  Plan -Morphine, Oxycodone PRN   CAD, HLD, HTN Pulmonary HTN > Followed by Dr. Aundra Dubin  -Most Recent EF >75% Plan  -Continue home Coreg, Cozaar  H/O Breast CA s/p bilateral mastectomy, Left in 2011, Right 2019 > Immuotherapy by Dr. Jana Hakim  Initially for paclitaxel x 12 cycles and Herceptin x 1 year.  Intolerant of paclitaxel, switched to abraxane Plan  -Continue Tamoxifen   Constipation  Plan -Added colace and PRN Senna  Bronch results were negative for malignancy, + for pseudomonas as colonizer, and + for fibrotic  Granuloma. These results were explained to the patient ion full by Dr. Lamonte Sakai.She verbalized understanding.  02/06/2019 Biopsy Result Lung, biopsy, Left Lingula (ag) - BENIGN LUNG PARENCHYMA WITH PATCHY HYALINE FIBROSIS AND FOCAL NECROSIS. SEE COMMENT - NO EVIDENCE OF MALIGNANCY  Diagnosis Note Though definitive granulomas are not seen, a  fibrotic granuloma cannot be ruled out  Sarajane Marek, AGACNP-BC [facility]  Pgr: [phone]  After 4 pm call PCCM Pgr: [phone]

## 2019-02-09 ENCOUNTER — Inpatient Hospital Stay (HOSPITAL_COMMUNITY): Payer: Medicare Other

## 2019-02-09 NOTE — Progress Notes (Signed)
Anna Valentine PCCM Progress Note   S: Afebrile. Pt denies chest pain / SOB.  States she feels "much better today"  O: Blood pressure 135/73, pulse 63, temperature 98.2 F (36.8 C), temperature source Oral, resp. rate 18, height _0  (1.651 m), weight 53.1 kg, SpO2 100 %.  General: thin adult female sitting up in chair in NAD HEENT: MM pink/moist, no jvd Neuro: AAOx4, speech clear, MAE  CV: s1s2 rrr, no m/r/g PULM:  Non-labored, lungs bilaterally clear, left chest tube in place without air leak, no leak with cough GI: soft, bsx4 active  Extremities: warm/dry, no edema  Skin: no rashes or lesions  Recent Labs  Lab 02/06/19 1438 02/07/19 0154 02/08/19 0641  HGB 11.8* 10.9* 10.3*  HCT 36.0 33.6* 31.6*  WBC 7.0 7.0 5.2  PLT 232 240 204   Recent Labs  Lab 02/06/19 1438 02/07/19 0154 02/08/19 0641  NA 135 136 137  K 4.3 4.6 4.0  CL 104 102 105  CO2 _1 GLUCOSE 126* 140* 95  BUN _2 CREATININE 0.59 0.75 0.64  CALCIUM 8.6* 8.7* 8.6*  MG 2.1 2.0 2.0  PHOS 3.2 3.5 3.3    A: Iatrogenic Right Pneumothorax  Hypermetabolic Pulmonary Nodules s/p ENB 7/29  -new right sided and lingular nodule on PET imaging 7/10 -TBB / washings cytology negative, possible focal necrosis on pathology / unclear if granuloma formation  -AFB smear negative, final results pending -BAL on R positive for pan-sensitive pseudomonas.  She does have some degree of bronchiectasis on imaging.   Hx of P53 positivity, Adenocarcinoma of the RLL s/p RLL Lobectomy  P: Place chest tube to water seal Follow up CXR in 4 hours  Pulmonary hygiene -IS, mobilize  Will not treat pseudomonas at this point as no clinical evidence of infection Will given flutter valve given bronchiectasis on imaging and instruct patient on use even at discharge Reviewed use of flutter at home for pulmonary clearance.    Noe Gens, NP-C [facility] Pgr: [phone] or if no answer [phone] 02/09/2019,  12:30 PM

## 2019-02-10 ENCOUNTER — Inpatient Hospital Stay (HOSPITAL_COMMUNITY): Payer: Medicare Other

## 2019-02-10 NOTE — Discharge Summary (Signed)
Physician Discharge Summary  Patient ID: Anna Valentine MRN: [id number] DOB/AGE: 01-26-1951 68 y.o.  Admit date: 02/06/2019 Discharge date: 02/10/2019    Discharge Diagnoses:  Hypermetabolic R Sided & Lingular Pulmonary Nodules status post ENB Iatrogenic Right Pneumothorax  Bronchiectasis  Hx of Adenocarcinoma of the RLL s/p RLL Lobectomy                                                                      DISCHARGE PLAN BY DIAGNOSIS      Hypermetabolic Pulmonary Nodules status post ENB Iatrogenic Right Pneumothorax  Bronchiectasis  Hx of Adenocarcinoma of the RLL s/p RLL Lobectomy   -new right sided and lingular nodule on PET imaging 7/10 -TBB / washings cytology negative, possible focal necrosis on pathology / unclear if granuloma formation  -AFB smear negative, final results pending -BAL on R positive for pan-sensitive pseudomonas.  She does have some degree of bronchiectasis on imaging.  Discharge Plan: Follow up with Dr. Lamonte Sakai as planned post discharge Await AFB Note BAL on R positive for pseudomonas, noted bronchiectasis, felt to be colonization given absence of clinical symptoms Continue flutter valve at home post discharge for maintenance pulmonary hygiene  Chest tube site care, no submersion in water for 48 hours                    DISCHARGE SUMMARY   68 y/o F admitted 7/29 with iatrogenic pneumothorax.  Initially brought in for ENB in the setting of new hypermetabolic pulmonary nodules on the right.  She has a known history of remote adenocarcinoma s/p RLL lobectomy, breast cancer and prior necrotizing granulomatous disease on prior nodular biopsies.  She underwent navigation bronchoscopy on 7/29 for the lingular nodule but unfortunately had an iatrogenic pneumothorax.  Chest tube was placed with resolution of pneumothorax. Cytology and washings negative for malignant cells.  Right BAL was positive for pan sensitive pseudomonas.  In the absence of clinical symptoms,  this was felt to be a colonization.  BAL for AFB and fungal elements are negative to date, final results pending.  Chest tube was transitioned to water seal on 8/1 with follow up CXR 8/2 am demonstrating no return of pneumothorax. Chest tube was removed with follow up CXR that was within normal limits.  The patient was medically cleared for discharge 8/2 with plans as above.        SIGNIFICANT DIAGNOSTIC STUDIES ENB   MICRO DATA  BAL R 7/29 >> pseudomonas >> pan-sensitive  BAL for AFB 7/29 >> smear negative >>  BAL for Fungal 7/29 >>   CYTOLOGY  ENB TBB, Washings 7/29 >> negative    TUBES / LINES Left Chest Tube 7/29 >>    Discharge Exam: General: adult female sitting up in chair in NAD Neuro:AAOx4, MAE CV: s1s2 rrrr, no m/r/g PULM: even/non-labored, lungs bilaterally clear Extremities: warm/dry, no edema or rashes   Vitals:   02/09/19 0523 02/09/19 1424 02/09/19 2015 02/10/19 0544  BP: 135/73 107/62 (!) 111/53 137/63  Pulse: 63 66 67 63  Resp: _0 Temp: 98.2 F (36.8 C) 98.1 F (36.7 C) (!) 97.4 F (36.3 C) (!) 97.4 F (36.3 C)  TempSrc: Oral Oral Oral Oral  SpO2: 100% 100%  100% 100%  Weight:      Height:         Discharge Labs  BMET Recent Labs  Lab 02/06/19 1438 02/07/19 0154 02/08/19 0641  NA 135 136 137  K 4.3 4.6 4.0  CL 104 102 105  CO2 _0 GLUCOSE 126* 140* 95  BUN _1 CREATININE 0.59 0.75 0.64  CALCIUM 8.6* 8.7* 8.6*  MG 2.1 2.0 2.0  PHOS 3.2 3.5 3.3    CBC Recent Labs  Lab 02/06/19 1438 02/07/19 0154 02/08/19 0641  HGB 11.8* 10.9* 10.3*  HCT 36.0 33.6* 31.6*  WBC 7.0 7.0 5.2  PLT 232 240 204    Discharge Instructions    Call MD for:  difficulty breathing, headache or visual disturbances   Complete by: As directed    Call MD for:  hives   Complete by: As directed    Call MD for:  persistant dizziness or light-headedness   Complete by: As directed    Call MD for:  persistant nausea and vomiting   Complete  by: As directed    Call MD for:  redness, tenderness, or signs of infection (pain, swelling, redness, odor or green/yellow discharge around incision site)   Complete by: As directed    Call MD for:  severe uncontrolled pain   Complete by: As directed    Call MD for:  temperature >100.4   Complete by: As directed    Diet - low sodium heart healthy   Complete by: As directed    Discharge instructions   Complete by: As directed    1. Resume home medications as per discharge instructions 2. Follow up with Dr. Lamonte Sakai as planned  3. Return to the ER immediately or call 911 if you have new or worsening symptoms 4. Keep chest tube site covered for 24 hours.  Do not submerge in water for 48 hours. Ok to shower but no swimming or bath.   Discharge patient   Complete by: As directed    After chest x-ray has been evaluated   Discharge disposition: 01-Home or Self Care   Discharge patient date: 02/06/2019   Increase activity slowly   Complete by: As directed        Follow-up Information    Collene Gobble, MD On 02/19/2019.   Specialty: Pulmonary Disease Why: 3:45pm Contact information: [address] [place] [address] [phone]            Allergies as of 02/10/2019      Reactions   Taxol [paclitaxel] Anaphylaxis   Tape Itching   Abraxane [paclitaxel Protein-bound Part] Rash   Clarithromycin Rash      Medication List    TAKE these medications   acyclovir 400 MG tablet Commonly known as: ZOVIRAX Take 1 tablet (400 mg total) by mouth See admin instructions. For breakouts take 475m by mouth twice a day.   calcium-vitamin D 500-200 MG-UNIT tablet Commonly known as: OSCAL WITH D Take 1 tablet by mouth daily.   carvedilol 6.25 MG tablet Commonly known as: COREG Take 1 tablet (6.25 mg total) by mouth 2 (two) times daily.   cholecalciferol 1000 units tablet Commonly known as: VITAMIN D Take 1,000 Units by mouth daily.   lidocaine-prilocaine  cream Commonly known as: EMLA Apply 1 application topically as needed.   losartan 25 MG tablet Commonly known as: COZAAR Take 1 tablet (25 mg total) by mouth 2 (two) times a day.  METAMUCIL PO Take 1 Scoop by mouth daily.   tamoxifen 20 MG tablet Commonly known as: NOLVADEX Take 20 mg by mouth daily.   vitamin C 500 MG tablet Commonly known as: ASCORBIC ACID Take 500 mg by mouth daily.       Disposition: Home. No new home health needs identified  Discharged Condition: Anna Valentine has met maximum benefit of inpatient care and is medically stable and cleared for discharge.  Patient is pending follow up as above.      Time spent on disposition:  32 Minutes.   Signed: Noe Gens, NP-C [facility] Pgr: [phone] Office: [phone]

## 2019-02-10 NOTE — Progress Notes (Signed)
Patient discharged to home. Verbalizes understanding of all discharge instructions including incision care and follow up MD visits. Patient transported home by spouse.

## 2019-02-10 NOTE — Progress Notes (Signed)
  [place] P[facility]M Progress Note   Subjective: No dyspnea reported through the night.  She is having some left back pain, slightly pleuritic in nature.  Objective: Vitals:   02/09/19 0523 02/09/19 1424 02/09/19 2015 02/10/19 0544  BP: 135/73 107/62 (!) 111/53 137/63  Pulse: 63 66 67 63  Resp: 18 16 14 16   Temp: 98.2 F (36.8 C) 98.1 F (36.7 C) (!) 97.4 F (36.3 C) (!) 97.4 F (36.3 C)  TempSrc: Oral Oral Oral Oral  SpO2: 100% 100% 100% 100%  Weight:      Height:       Gen: Pleasant, well-nourished, in no distress,  normal affect, sitting up to chair  Lungs: No use of accessory muscles, no crackles or wheezing on normal respiration.  Pigtail chest tube is in place off suction, no air leak noted in collection system.  Cardiovascular: RRR, heart sounds normal, no murmur or gallops, no peripheral edema  Musculoskeletal: No deformities, no cyanosis or clubbing  Neuro: alert, awake, non focal  Skin: Warm, no lesions or rashes   Chest x-ray this morning off suction reviewed by me.  No reaccumulation of her left pneumothorax, very small apical residual air noted (smaller than 8/1)   Impression:  Lingular pulmonary nodule, status post navigational bronchoscopy 7/29.  No evidence of malignancy.  There was some possible hyalin fibrosis and focal necrosis.  Unclear as to whether there was any actual granuloma formation. -Plan to follow culture data to completion, particularly AFB. -Bacterial cultures revealed Pseudomonas, likely colonizer but may played a role in her waxing and waning nodular disease.  Iatrogenic left pneumothorax post biopsy.  Now resolved, stable off suction. -Plan to remove her chest tube today -Repeat chest x-ray after about 3 hours.  If the pneumothorax does not return then we will plan to discharge her to home today.   Baltazar Apo, MD, PhD 02/10/2019, 11:28 AM [facility] [phone] or if no answer [phone]

## 2019-02-16 ENCOUNTER — Other Ambulatory Visit (HOSPITAL_COMMUNITY): Payer: Self-pay | Admitting: Cardiology

## 2019-02-18 ENCOUNTER — Telehealth: Payer: Self-pay | Admitting: Emergency Medicine

## 2019-02-18 NOTE — Telephone Encounter (Signed)
[facility] in regards to the call we received about critical labs. Spoke with Magda Paganini from Microbiology. Per Magda Paganini, pt's culture has come back and it showed that it is positive for micobacterium avium. The results are able to be seen in pt's epic chart under lab tab with date of 02/06/2019.  Pt does have an appt scheduled tomorrow, 8/11 with Dr. Lamonte Sakai for a HFU.  Routing to Dr. Lamonte Sakai as an Juluis Rainier.

## 2019-02-19 ENCOUNTER — Ambulatory Visit (INDEPENDENT_AMBULATORY_CARE_PROVIDER_SITE_OTHER): Payer: Medicare Other | Admitting: Emergency Medicine

## 2019-02-19 ENCOUNTER — Encounter: Payer: Self-pay | Admitting: Emergency Medicine

## 2019-02-19 ENCOUNTER — Ambulatory Visit (INDEPENDENT_AMBULATORY_CARE_PROVIDER_SITE_OTHER): Payer: Medicare Other

## 2019-02-19 ENCOUNTER — Other Ambulatory Visit: Payer: Self-pay

## 2019-02-19 VITALS — BP 112/64 | HR 62 | Ht 66.0 in | Wt 116.0 lb

## 2019-02-19 DIAGNOSIS — J939 Pneumothorax, unspecified: Secondary | ICD-10-CM | POA: Diagnosis not present

## 2019-02-19 DIAGNOSIS — J479 Bronchiectasis, uncomplicated: Secondary | ICD-10-CM

## 2019-02-19 NOTE — Patient Instructions (Addendum)
Chest x-ray today Your culture information from the bronchoscopy has shown Pseudomonas and Mycobacterium avium.  It is unclear whether these organisms are causing active infection versus just colonization.  I suspect the latter.  We will await the sensitivity report from the Mycobacterium avium and then get back together to discuss the options regarding following your chest CT, possible antibiotic treatment. Follow with Dr Lamonte Sakai in 3 -4 weeks

## 2019-02-19 NOTE — Assessment & Plan Note (Signed)
Repeat chest x-ray today.  She has described some left-sided back discomfort and fatigue since discharge from the hospital.

## 2019-02-19 NOTE — Assessment & Plan Note (Signed)
Culture data from bronchoscopy positive for Pseudomonas, MAIC.  Suspect these are colonizers but certainly at risk for progression, bronchitis/pneumonia.  Suspect that we can follow her with serial imaging unless she changes clinically.  The sensitivities on the Mycobacterium are pending and we will get back together, revisit once these are available.

## 2019-02-19 NOTE — Telephone Encounter (Signed)
We will discuss today at her OV

## 2019-02-19 NOTE — Progress Notes (Signed)
Subjective:    Patient ID: Anna Valentine, female    DOB: [birth year], 68 y.o.   MRN: [id number]  HPI 68 year old former smoker with a history of non-small cell lung cancer status post lobectomy and right upper and middle lobe wedge resections. She also has a history of breast cancer post lumpectomy. 's been followed with serial CT scans of the chest at [facility] read on a scan 01/26/16 she was found to have a right upper lobe posterior soft tissue component with associated bronchiectasis and airway distortion, an inferior ground glass area. This prompted PET scans 02/04/16 and 8/28/17that I have reviewed showed decrease in size of an ill-defined hypermetabolic groundglass opacity and centrilobular nodularity in the lateral right upper lobe. There was a stable irregular soft tissue lesion in the RUL that was not hypermetabolic. Only a repeat CT scan of the chest on 06/08/16 shows continued bronchiectasis, increase in mucous plugging and right lower lobe consolidation. Stable left apical GGI as well. She has felt well. She does occasionally cough, can bring up green phlegm. Does not cough every day. She has experienced a single episode of hemoptysis in 11/2015.    ROV 07/16/18 --Anna Valentine is 28.  She follows up today for history of bronchiectasis and suspected chronic mycobacterial colonization (culture negative).  She has a history of non-small cell lung cancer treated by RUL amd RML wedges, RLL lobectomy.  Unfortunately since last visit she is also been diagnosed with breast cancer and is under treatment with Dr Jana Hakim.  We have been following apical and peripheral pleural-based opacities by CT scan of the chest.  Her most recent CT was done at [facility] 06/05/2018.  She had extensive bronchiectasis with some pleural thickening, mucous plugging similar to prior films.  She has scattered pulmonary nodules including some new nodules that were all 4 mm or less.  There is a left lower lobe nodule that  developing some central cavitation cystic change 1 cm.  Occasionally coughs up mucous, usually pale. No daily sputum burden.    ROV 02/19/2019 --follow-up visit for Anna Valentine, 68 year old woman with a history of non-small cell lung cancer, granulomatous disease and suspected chronic mycobacterial colonization with bronchiectasis.  Also with a history of breast cancer.  We have followed waxing and waning pulmonary nodular disease by CT scan and PET scan.  Most recent scan from 02/02/2019 showed resolution of 2 right-sided nodules but persistence of a poorly formed left-sided nodule.  This prompted [place] to perform navigational bronchoscopy.  Lab work was suspicious for possible granulomatous disease, AFB now positive for Mycobacterium avium.  Bacterial cultures with Pseudomonas.  Cytology negative.  The course was complicated by an iatrogenic pneumothorax.  Today she reports that since discharge she had some back pain, fatigue. This has improved, no new SOB or pleuritic pain.    Review of Systems  Constitutional: Negative.  Negative for fever and unexpected weight change.  HENT: Negative.  Negative for congestion, dental problem, ear pain, nosebleeds, postnasal drip, rhinorrhea, sinus pressure, sneezing, sore throat and trouble swallowing.   Eyes: Negative.  Negative for redness and itching.  Respiratory: Negative.  Negative for cough, chest tightness, shortness of breath and wheezing.   Cardiovascular: Negative.  Negative for palpitations and leg swelling.  Gastrointestinal: Negative.  Negative for nausea and vomiting.  Genitourinary: Negative.  Negative for dysuria.  Musculoskeletal: Negative.  Negative for joint swelling.  Skin: Negative.  Negative for rash.  Neurological: Negative.  Negative for headaches.  Hematological: Negative.  Does not bruise/bleed easily.  Psychiatric/Behavioral: Negative.  Negative for dysphoric mood. The patient is not nervous/anxious.         Objective:   Physical  Exam Vitals:   02/19/19 1558  BP: 112/64  Pulse: 62  SpO2: 98%  Weight: 116 lb (52.6 kg)  Height: 5\' 6"  (1.676 m)   Gen: Pleasant, thin woman, well-nourished, in no distress,  normal affect  ENT: No lesions,  mouth clear,  oropharynx clear, no postnasal drip  Neck: No JVD, no stridor  Lungs: No use of accessory muscles, clear without rales or rhonchi  Cardiovascular: RRR, heart sounds normal, no murmur or gallops, no peripheral edema  Musculoskeletal: No deformities, no cyanosis or clubbing  Neuro: alert, non focal  Skin: Warm, no lesions or rash     Assessment & Plan:  Bronchiectasis without acute exacerbation (HCC) Culture data from bronchoscopy positive for Pseudomonas, MAIC.  Suspect these are colonizers but certainly at risk for progression, bronchitis/pneumonia.  Suspect that we can follow her with serial imaging unless she changes clinically.  The sensitivities on the Mycobacterium are pending and we will get back together, revisit once these are available.  Iatrogenic pneumothorax Repeat chest x-ray today.  She has described some left-sided back discomfort and fatigue since discharge from the hospital.  Baltazar Apo, MD, PhD 02/19/2019, 4:26 PM [facility] [phone] or if no answer [phone]

## 2019-02-21 NOTE — Telephone Encounter (Signed)
Please advise on CXR results. Thanks!

## 2019-02-22 ENCOUNTER — Encounter: Payer: Self-pay | Admitting: Gastroenterology

## 2019-02-25 NOTE — Telephone Encounter (Signed)
Sorry you did not get your results .   Your Chest xray showed chronic changes.  The  Pneumothorax -left has resolved.   Please continue with Dr. Lamonte Sakai  Office visit recommendations .   Please contact office for sooner follow up if symptoms do not improve or worsen or seek emergency care

## 2019-02-25 NOTE — Telephone Encounter (Signed)
Relayed result note from Rexene Edison, NP.  Nothing further needed at this time.

## 2019-02-27 ENCOUNTER — Other Ambulatory Visit: Payer: Self-pay

## 2019-02-27 ENCOUNTER — Ambulatory Visit: Payer: Medicare Other

## 2019-02-27 ENCOUNTER — Other Ambulatory Visit: Payer: Medicare Other

## 2019-02-27 LAB — AFB ORGANISM ID BY DNA PROBE
M avium complex: 10 — AB
M tuberculosis complex: NEGATIVE

## 2019-02-27 LAB — ACID FAST CULTURE WITH REFLEXED SENSITIVITIES (MYCOBACTERIA): Acid Fast Culture: POSITIVE — AB

## 2019-02-27 LAB — MAC SUSCEPTIBILITY BROTH
Amikacin: 16
Clarithromycin: 4
Linezolid: 32
Moxifloxacin: 8
Streptomycin: 64

## 2019-03-01 ENCOUNTER — Other Ambulatory Visit: Payer: Self-pay | Admitting: Internal Medicine

## 2019-03-03 ENCOUNTER — Other Ambulatory Visit: Payer: Self-pay | Admitting: Oncology

## 2019-03-03 NOTE — Progress Notes (Unsigned)
Anna Valentine underwent fiberoptic bronchoscopy 02/06/2019 with all washings and biopsies negative for cancer.  Unfortunately she developed a pneumothorax and underwent left tube thoracostomy.  She now wants to know when she can have her port removed.  This is at her discretion.

## 2019-03-07 ENCOUNTER — Other Ambulatory Visit (HOSPITAL_COMMUNITY): Payer: Medicare Other

## 2019-03-08 ENCOUNTER — Encounter (HOSPITAL_COMMUNITY): Payer: Self-pay | Admitting: Cardiology

## 2019-03-08 ENCOUNTER — Other Ambulatory Visit: Payer: Self-pay

## 2019-03-08 ENCOUNTER — Ambulatory Visit (HOSPITAL_COMMUNITY)
Admission: RE | Admit: 2019-03-08 | Discharge: 2019-03-08 | Disposition: A | Discharge status: Home / Self Care | Payer: Medicare Other | Source: Ambulatory Visit | Attending: Cardiology | Admitting: Cardiology

## 2019-03-08 ENCOUNTER — Ambulatory Visit (HOSPITAL_BASED_OUTPATIENT_CLINIC_OR_DEPARTMENT_OTHER)
Admission: RE | Admit: 2019-03-08 | Discharge: 2019-03-08 | Disposition: A | Discharge status: Home / Self Care | Payer: Medicare Other | Source: Ambulatory Visit | Attending: Cardiology | Admitting: Cardiology

## 2019-03-08 VITALS — BP 140/80 | HR 63 | Wt 116.6 lb

## 2019-03-08 DIAGNOSIS — C50212 Malignant neoplasm of upper-inner quadrant of left female breast: Secondary | ICD-10-CM | POA: Diagnosis not present

## 2019-03-08 DIAGNOSIS — I272 Pulmonary hypertension, unspecified: Secondary | ICD-10-CM | POA: Diagnosis not present

## 2019-03-08 DIAGNOSIS — I5022 Chronic systolic (congestive) heart failure: Secondary | ICD-10-CM | POA: Diagnosis not present

## 2019-03-08 DIAGNOSIS — E7800 Pure hypercholesterolemia, unspecified: Secondary | ICD-10-CM | POA: Diagnosis not present

## 2019-03-08 DIAGNOSIS — Z803 Family history of malignant neoplasm of breast: Secondary | ICD-10-CM | POA: Insufficient documentation

## 2019-03-08 DIAGNOSIS — Z801 Family history of malignant neoplasm of trachea, bronchus and lung: Secondary | ICD-10-CM | POA: Diagnosis not present

## 2019-03-08 DIAGNOSIS — Z79899 Other long term (current) drug therapy: Secondary | ICD-10-CM | POA: Insufficient documentation

## 2019-03-08 DIAGNOSIS — Z825 Family history of asthma and other chronic lower respiratory diseases: Secondary | ICD-10-CM | POA: Insufficient documentation

## 2019-03-08 DIAGNOSIS — Z09 Encounter for follow-up examination after completed treatment for conditions other than malignant neoplasm: Secondary | ICD-10-CM | POA: Diagnosis not present

## 2019-03-08 DIAGNOSIS — Z800 Family history of malignant neoplasm of digestive organs: Secondary | ICD-10-CM | POA: Diagnosis not present

## 2019-03-08 DIAGNOSIS — Z806 Family history of leukemia: Secondary | ICD-10-CM | POA: Insufficient documentation

## 2019-03-08 DIAGNOSIS — Z87891 Personal history of nicotine dependence: Secondary | ICD-10-CM | POA: Insufficient documentation

## 2019-03-08 DIAGNOSIS — I2510 Atherosclerotic heart disease of native coronary artery without angina pectoris: Secondary | ICD-10-CM | POA: Insufficient documentation

## 2019-03-08 DIAGNOSIS — Z8042 Family history of malignant neoplasm of prostate: Secondary | ICD-10-CM | POA: Diagnosis not present

## 2019-03-08 DIAGNOSIS — Z85118 Personal history of other malignant neoplasm of bronchus and lung: Secondary | ICD-10-CM | POA: Insufficient documentation

## 2019-03-08 DIAGNOSIS — I509 Heart failure, unspecified: Secondary | ICD-10-CM | POA: Diagnosis not present

## 2019-03-08 DIAGNOSIS — Z170 Estrogen receptor positive status [ER+]: Secondary | ICD-10-CM | POA: Diagnosis not present

## 2019-03-08 NOTE — Patient Instructions (Signed)
Your physician has requested that you have an echocardiogram. Echocardiography is a painless test that uses sound waves to create images of your heart. It provides your doctor with information about the size and shape of your heart and how well your heart's chambers and valves are working. This procedure takes approximately one hour. There are no restrictions for this procedure.  PLEASE CHECK YOUR BLOOD PRESSURE DAILY FOR 2 WEEKS AND FAX OR CALL IN READINGS.  [place] NUMBER [phone].  Your physician recommends that you schedule a follow-up appointment in: 1 YEAR WITH ECHO AT TIME OF VISIT.   At the [facility], you and your health needs are our priority. As part of our continuing mission to provide you with exceptional heart care, we have created designated Provider Care Teams. These Care Teams include your primary Cardiologist (physician) and Advanced Practice Providers (APPs- Physician Assistants and Nurse Practitioners) who all work together to provide you with the care you need, when you need it.   You may see any of the following providers on your designated Care Team at your next follow up: Marland Kitchen Dr Glori Bickers . Dr Loralie Champagne . Darrick Grinder, NP   Please be sure to bring in all your medications bottles to every appointment.

## 2019-03-08 NOTE — Progress Notes (Signed)
2D Echocardiogram has been performed.  Anna Valentine 03/08/2019, 11:03 AM

## 2019-03-09 NOTE — Progress Notes (Signed)
Oncology: Dr. Jana Hakim Cardiology: Dr. Aundra Dubin  68 y.o. with history of COPD, prior lung cancer, and breast cancer was referred by Dr. Jana Hakim for cardio-oncology evaluation given use of Herceptin. Patient had breast cancer diagnosed on left in 2/11, ER+/PR+/HER2-.  She had left lumpectomy and radiation.  Diagnosed in 7/19 with right breast cancer, ER+/PR-/HER2+.  She had bilateral mastectomy.  Plan initially for paclitaxel x 12 cycles and Herceptin x 1 year.  Intolerant of paclitaxel, switched to abraxane.    She had atypical chest pain in 6/19, Cardiolite at that time showed no ischemia.  Other than that incident, no cardiac history.  She is a prior smoker but has quit.  No exertional dyspnea or chest pain.    Echo in 3/20 showed significantly less negative strain, and Herceptin was held.  There is no plan to restart Herceptin.  She was started on Coreg. Repeat echo in 4/20 showed EF down to 50-55%, losartan was added.  Repeat echo in 6/20 showed EF lower again at 45-50%.  Echo was done today and reviewed, EF 55%.  She is stable clinically.  Still fatigues easily and gets short of breath with heavy exertion.  No chest pain.  BP mildly elevated.    Labs (6/20): K 4.3, creatinine 0.77 Labs (7/20): K 4, creatinine 0.64, LDL 105  PMH: 1. Bronchiectasis: MAIC noted.  2. Lung cancer: Adenocarcinoma.  In 11/06, had RUL wedge resection, right middle lobe wedge resection, and right lower lobectomy.  3. Breast cancer: Diagnosed on left in 2/11, ER+/PR+/HER2-.  She had left lumpectomy and radiation.  Diagnosed in 7/19 with right breast cancer, ER+/PR-/HER2+.  She had bilateral mastectomy.  Plan initially for paclitaxel x 12 cycles and Herceptin x 1 year.  Intolerant of paclitaxel, switched to abraxane.   - Echo (12/19): EF 55-60%, GLS -19%, normal RV size and systolic function, PASP 48 mmHg - Echo (3/20): EF 55%, GLS -13.3%. Normal RV size and systolic function.  - Echo (4/20): EF 50-55%, grade 2  diastolic dysfunction, RV normal. - Echo (6/20): EF 09-98%, grade 2 diastolic dysfunction, normal RV, GLS -17.7%.  - Echo (8/20): EF 55%, GLS -16.1%, normal RV.  4. Chest pain: Cardiolite (6/19) with EF 75%, no ischemia.   Social History   Socioeconomic History  . Marital status: Married    Spouse name: Not on file  . Number of children: 0  . Years of education: Not on file  . Highest education level: Not on file  Occupational History  . Occupation: OFFICE MANAGER    Employer: [employer]  Social Needs  . Financial resource strain: Not on file  . Food insecurity    Worry: Not on file    Inability: Not on file  . Transportation needs    Medical: Not on file    Non-medical: Not on file  Tobacco Use  . Smoking status: Former Smoker    Packs/day: 2.00    Years: 18.00    Pack years: 36.00    Quit date: 07/12/1987    Years since quitting: 31.6  . Smokeless tobacco: Never Used  Substance and Sexual Activity  . Alcohol use: Yes    Alcohol/week: 14.0 standard drinks    Types: 7 Glasses of wine, 7 Shots of liquor per week  . Drug use: No  . Sexual activity: Yes  Lifestyle  . Physical activity    Days per week: Not on file    Minutes per session: Not on file  . Stress: Not on  file  Relationships  . Social Herbalist on phone: Not on file    Gets together: Not on file    Attends religious service: Not on file    Active member of club or organization: Not on file    Attends meetings of clubs or organizations: Not on file    Relationship status: Not on file  . Intimate partner violence    Fear of current or ex partner: Not on file    Emotionally abused: Not on file    Physically abused: Not on file    Forced sexual activity: Not on file  Other Topics Concern  . Not on file  Social History Narrative  . Not on file   Family History  Problem Relation Age of Onset  . Allergies Mother   . Asthma Mother   . Lung cancer Mother   . Breast cancer Mother 47        recurrence age 13  . Colon cancer Father 61  . Prostate cancer Brother 70  . Breast cancer Sister 60       Recurrence age 34 BRCA negative  . Colon polyps Sister   . Leukemia Sister   . Breast cancer Sister 28  . Prostate cancer Brother 54  . Breast cancer Maternal Grandmother 3  . Colon cancer Maternal Aunt   . Leukemia Maternal Grandfather   . Lung cancer Maternal Aunt   . Breast cancer Cousin   . Esophageal cancer Neg Hx   . Rectal cancer Neg Hx   . Stomach cancer Neg Hx    ROS: All systems reviewed and negative except as per HPI.   Current Outpatient Medications  Medication Sig Dispense Refill  . acyclovir (ZOVIRAX) 400 MG tablet TAKE 1 TABLET BY MOUTH TWICE A DAY 180 tablet 1  . calcium-vitamin D (OSCAL WITH D) 500-200 MG-UNIT tablet Take 1 tablet by mouth daily.    . carvedilol (COREG) 6.25 MG tablet Take 1 tablet (6.25 mg total) by mouth 2 (two) times daily. 180 tablet 3  . cholecalciferol (VITAMIN D) 1000 UNITS tablet Take 1,000 Units by mouth daily.    Marland Kitchen lidocaine-prilocaine (EMLA) cream Apply 1 application topically as needed. 30 g 0  . losartan (COZAAR) 25 MG tablet TAKE 1 TABLET BY MOUTH EVERY DAY (Patient taking differently: 2 (two) times daily. ) 90 tablet 1  . Psyllium (METAMUCIL PO) Take 1 Scoop by mouth daily.     . tamoxifen (NOLVADEX) 20 MG tablet Take 20 mg by mouth daily.    . vitamin C (ASCORBIC ACID) 500 MG tablet Take 500 mg by mouth daily.     No current facility-administered medications for this encounter.    BP 140/80   Pulse 63   Wt 52.9 kg (116 lb 9.6 oz)   SpO2 97%   BMI 18.82 kg/m  General: NAD Neck: No JVD, no thyromegaly or thyroid nodule.  Lungs: Decreased BS right base.  CV: Nondisplaced PMI.  Heart regular S1/S2, no S3/S4, no murmur.  No peripheral edema.  No carotid bruit.  Normal pedal pulses.  Abdomen: Soft, nontender, no hepatosplenomegaly, no distention.  Skin: Intact without lesions or rashes.  Neurologic: Alert and oriented x  3.  Psych: Normal affect. Extremities: No clubbing or cyanosis.  HEENT: Normal.   Assessment/Plan: 1. Breast cancer: I had concern in 3/20 for cardiotoxicity from Herceptin, and it has been stopped.  Per Dr. Jana Hakim, will not rechallenge.  Echo was done today and reviewed,  EF back up to 55%.    - Continue Coreg 6.25 mg bid.  - Continue losartan 25 mg bid.  She will check BP daily x 2 wks and if SBP is consistently above 130, would increase losartan.  - Repeat echo in 1 year.  2. Pulmonary hypertension: By prior echo, PASP 48 mmHg.  She has a history of bronchiectasis and also of extensive right lung resections for lung cancer.  Suspect group 3 pulmonary hypertension from lung disease.  3. CAD: Coronary calcification noted on prior CT.  She really is not interested in starting a statin.  LDL is not markedly elevated.   Followup in 1 year with echo.   Loralie Champagne 03/09/2019

## 2019-03-11 LAB — FUNGAL ORGANISM REFLEX

## 2019-03-11 LAB — FUNGUS CULTURE WITH STAIN

## 2019-03-11 LAB — FUNGUS CULTURE RESULT

## 2019-03-14 ENCOUNTER — Other Ambulatory Visit (HOSPITAL_COMMUNITY): Payer: Self-pay | Admitting: Cardiology

## 2019-03-20 ENCOUNTER — Other Ambulatory Visit: Payer: Medicare Other

## 2019-03-20 ENCOUNTER — Ambulatory Visit: Payer: Medicare Other

## 2019-03-20 ENCOUNTER — Other Ambulatory Visit: Payer: Self-pay

## 2019-03-26 ENCOUNTER — Encounter: Payer: Self-pay | Admitting: Emergency Medicine

## 2019-03-26 ENCOUNTER — Other Ambulatory Visit: Payer: Self-pay

## 2019-03-26 ENCOUNTER — Ambulatory Visit (INDEPENDENT_AMBULATORY_CARE_PROVIDER_SITE_OTHER): Payer: Medicare Other | Admitting: Emergency Medicine

## 2019-03-26 ENCOUNTER — Other Ambulatory Visit: Payer: Self-pay | Admitting: Oncology

## 2019-03-26 VITALS — BP 136/74 | HR 60 | Ht 66.0 in | Wt 116.0 lb

## 2019-03-26 DIAGNOSIS — R911 Solitary pulmonary nodule: Secondary | ICD-10-CM

## 2019-03-26 DIAGNOSIS — J479 Bronchiectasis, uncomplicated: Secondary | ICD-10-CM | POA: Diagnosis not present

## 2019-03-26 NOTE — Progress Notes (Signed)
Subjective:    Patient ID: [name], female    DOB: 1950/11/18, 68 y.o.   MRN: [id number]  HPI 68 year old former smoker with a history of non-small cell lung cancer status post lobectomy and right upper and middle lobe wedge resections. She also has a history of breast cancer post lumpectomy. 's been followed with serial CT scans of the chest at [facility] read on a scan 01/26/16 she was found to have a right upper lobe posterior soft tissue component with associated bronchiectasis and airway distortion, an inferior ground glass area. This prompted PET scans 02/04/16 and 8/28/17that I have reviewed showed decrease in size of an ill-defined hypermetabolic groundglass opacity and centrilobular nodularity in the lateral right upper lobe. There was a stable irregular soft tissue lesion in the RUL that was not hypermetabolic. Only a repeat CT scan of the chest on 06/08/16 shows continued bronchiectasis, increase in mucous plugging and right lower lobe consolidation. Stable left apical GGI as well. She has felt well. She does occasionally cough, can bring up green phlegm. Does not cough every day. She has experienced a single episode of hemoptysis in 11/2015.    ROV 07/16/18 --Ms. Bardwell is 37.  She follows up today for history of bronchiectasis and suspected chronic mycobacterial colonization (culture negative).  She has a history of non-small cell lung cancer treated by RUL amd RML wedges, RLL lobectomy.  Unfortunately since last visit she is also been diagnosed with breast cancer and is under treatment with Dr Jana Hakim.  We have been following apical and peripheral pleural-based opacities by CT scan of the chest.  Her most recent CT was done at [facility] 06/05/2018.  She had extensive bronchiectasis with some pleural thickening, mucous plugging similar to prior films.  She has scattered pulmonary nodules including some new nodules that were all 4 mm or less.  There is a left lower lobe nodule that  developing some central cavitation cystic change 1 cm.  Occasionally coughs up mucous, usually pale. No daily sputum burden.    ROV 02/19/2019 --follow-up visit for Ms Bowden, 68 year old woman with a history of non-small cell lung cancer, granulomatous disease and suspected chronic mycobacterial colonization with bronchiectasis.  Also with a history of breast cancer.  We have followed waxing and waning pulmonary nodular disease by CT scan and PET scan.  Most recent scan from 02/02/2019 showed resolution of 2 right-sided nodules but persistence of a poorly formed left-sided nodule.  This prompted [place] to perform navigational bronchoscopy.  Lab work was suspicious for possible granulomatous disease, AFB now positive for Mycobacterium avium.  Bacterial cultures with Pseudomonas.  Cytology negative.  The course was complicated by an iatrogenic pneumothorax.  Today she reports that since discharge she had some back pain, fatigue. This has improved, no new SOB or pleuritic pain.   ROV 03/26/2019 --68 year old woman with a history of non-small cell lung cancer and granulomatous disease diagnosed at her time of original surgery as detailed above.  She had persistent opacity present 02/02/2019 so we proceeded with navigational bronchoscopy.  Ultimately cytologies have been negative and AFB positive for Mycobacterium avium, bacterial cultures positive for Pseudomonas.  She had an iatrogenic pneumothorax which is now resolved.  Overall she feels well, denies any dyspnea, sputum.  She does have some nasal drainage and clear some mucus from her throat but does not believe it is coming from her chest.  Mycobacterium avium sensitive to clarithromycin, amikacin, moxifloxacin, linezolid. Pseudomonas pansensitive   Review of Systems  Constitutional: Negative.  Negative for fever and unexpected weight change.  HENT: Negative.  Negative for congestion, dental problem, ear pain, nosebleeds, postnasal drip, rhinorrhea, sinus  pressure, sneezing, sore throat and trouble swallowing.   Eyes: Negative.  Negative for redness and itching.  Respiratory: Negative.  Negative for cough, chest tightness, shortness of breath and wheezing.   Cardiovascular: Negative.  Negative for palpitations and leg swelling.  Gastrointestinal: Negative.  Negative for nausea and vomiting.  Genitourinary: Negative.  Negative for dysuria.  Musculoskeletal: Negative.  Negative for joint swelling.  Skin: Negative.  Negative for rash.  Neurological: Negative.  Negative for headaches.  Hematological: Negative.  Does not bruise/bleed easily.  Psychiatric/Behavioral: Negative.  Negative for dysphoric mood. The patient is not nervous/anxious.         Objective:   Physical Exam Vitals:   03/26/19 1108  BP: 136/74  Pulse: 60  SpO2: 99%  Weight: 116 lb (52.6 kg)  Height: 5\' 6"  (1.676 m)   Gen: Pleasant, thin woman, well-nourished, in no distress,  normal affect  ENT: No lesions,  mouth clear,  oropharynx clear, no postnasal drip  Neck: No JVD, no stridor  Lungs: No use of accessory muscles, clear without rales or rhonchi  Cardiovascular: RRR, heart sounds normal, no murmur or gallops, no peripheral edema  Musculoskeletal: No deformities, no cyanosis or clubbing  Neuro: alert, non focal  Skin: Warm, no lesions or rash     Assessment & Plan:  Bronchiectasis without acute exacerbation (HCC) Her recent bronchoscopy confirmed granulomatous disease and now Mycobacterium avium.  Based on her clinical status complicated decision about whether to treat.  She is asymptomatic.  One issue will be the waxing and waning nodular disease.  Her history of non-small cell lung cancer makes this complicated-every nodule that we find will need to be evaluated and followed for the potential for lung cancer (versus her granulomatous disease).  We discussed possible treatment today.  She does understand that there is no guarantee that we clear the  Mycobacterium or that it could and return.  For now we decided to defer treatment, follow her with a repeat scan in January for interval stability/change.  She also understands that at some point we may be compelled to repeat bronchoscopy and biopsies if we are unclear as to whether nodules represent her mycobacterial disease versus a possible cancer recurrence.  We will plan to repeat your CT scan of the chest without contrast in January 2021. Please let us know if you have any changes in your cough, sputum production, breathing.  If so then we may consider repeating your imaging early. Get the flu shot this fall Pneumonia shot is up-to-date Follow with Dr Lamonte Sakai in 6 months or sooner if you have any problems  Baltazar Apo, MD, PhD 03/26/2019, 11:41 AM [facility] [phone] or if no answer [phone]

## 2019-03-26 NOTE — Patient Instructions (Addendum)
We will plan to repeat your CT scan of the chest without contrast in January 2021. Please let us know if you have any changes in your cough, sputum production, breathing.  If so then we may consider repeating your imaging early. Get the flu shot this fall Pneumonia shot is up-to-date Follow with Dr Lamonte Sakai in 6 months or sooner if you have any problems

## 2019-03-26 NOTE — Addendum Note (Signed)
Addended by: Desmond Dike C on: 03/26/2019 11:49 AM   Modules accepted: Orders

## 2019-03-26 NOTE — Assessment & Plan Note (Signed)
Her recent bronchoscopy confirmed granulomatous disease and now Mycobacterium avium.  Based on her clinical status complicated decision about whether to treat.  She is asymptomatic.  One issue will be the waxing and waning nodular disease.  Her history of non-small cell lung cancer makes this complicated-every nodule that we find will need to be evaluated and followed for the potential for lung cancer (versus her granulomatous disease).  We discussed possible treatment today.  She does understand that there is no guarantee that we clear the Mycobacterium or that it could and return.  For now we decided to defer treatment, follow her with a repeat scan in January for interval stability/change.  She also understands that at some point we may be compelled to repeat bronchoscopy and biopsies if we are unclear as to whether nodules represent her mycobacterial disease versus a possible cancer recurrence.  We will plan to repeat your CT scan of the chest without contrast in January 2021. Please let us know if you have any changes in your cough, sputum production, breathing.  If so then we may consider repeating your imaging early. Get the flu shot this fall Pneumonia shot is up-to-date Follow with Dr Lamonte Sakai in 6 months or sooner if you have any problems

## 2019-04-06 ENCOUNTER — Other Ambulatory Visit (HOSPITAL_COMMUNITY): Payer: Self-pay | Admitting: Cardiology

## 2019-04-10 ENCOUNTER — Other Ambulatory Visit: Payer: Medicare Other

## 2019-04-10 ENCOUNTER — Other Ambulatory Visit: Payer: Self-pay

## 2019-04-10 ENCOUNTER — Ambulatory Visit: Payer: Medicare Other

## 2019-04-11 ENCOUNTER — Other Ambulatory Visit: Payer: Self-pay

## 2019-04-11 DIAGNOSIS — Z20822 Contact with and (suspected) exposure to covid-19: Secondary | ICD-10-CM

## 2019-04-12 ENCOUNTER — Encounter (HOSPITAL_BASED_OUTPATIENT_CLINIC_OR_DEPARTMENT_OTHER): Payer: Self-pay | Admitting: *Deleted

## 2019-04-12 LAB — NOVEL CORONAVIRUS, NAA: SARS-CoV-2, NAA: NOT DETECTED

## 2019-04-14 ENCOUNTER — Encounter (HOSPITAL_COMMUNITY): Payer: Self-pay | Admitting: Anesthesiology

## 2019-04-15 ENCOUNTER — Ambulatory Visit (HOSPITAL_BASED_OUTPATIENT_CLINIC_OR_DEPARTMENT_OTHER): Admission: RE | Admit: 2019-04-15 | Payer: Medicare Other | Source: Home / Self Care | Admitting: General Surgery

## 2019-04-15 SURGERY — REMOVAL PORT-A-CATH
Anesthesia: LOCAL

## 2019-04-19 ENCOUNTER — Other Ambulatory Visit: Payer: Self-pay

## 2019-04-19 ENCOUNTER — Ambulatory Visit (INDEPENDENT_AMBULATORY_CARE_PROVIDER_SITE_OTHER): Payer: Medicare Other | Admitting: Internal Medicine

## 2019-04-19 DIAGNOSIS — Z23 Encounter for immunization: Secondary | ICD-10-CM | POA: Diagnosis not present

## 2019-04-19 NOTE — Progress Notes (Signed)
Flu vaccine per CMA

## 2019-04-19 NOTE — Patient Instructions (Signed)
Patient received a flu vaccine IM L deltoid, AV, CMA

## 2019-05-01 ENCOUNTER — Other Ambulatory Visit: Payer: Medicare Other

## 2019-05-01 ENCOUNTER — Inpatient Hospital Stay: Payer: Medicare Other | Admitting: Adult Health

## 2019-05-01 ENCOUNTER — Inpatient Hospital Stay: Payer: Medicare Other

## 2019-05-01 ENCOUNTER — Ambulatory Visit: Payer: Medicare Other

## 2019-05-01 ENCOUNTER — Telehealth: Payer: Self-pay | Admitting: Adult Health

## 2019-05-01 NOTE — Telephone Encounter (Signed)
Called pt per 10/20 sch message - unable to reach pt . Left message for patient to call back to reschedule cancelled apt

## 2019-05-09 NOTE — Progress Notes (Deleted)
HPI :  69 year old female known to me from prior colonoscopy, here for follow-up visit in light of recent genetic testing. She is a history of lung cancer, history of left breast cancer in 2011, history of recent diagnosis of right breast cancer in July 2019. 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  Genetics recommended consideration for colonoscopy every 2-5 years starting at age 38.   She reports feeling well at baseline. She denies any blood in her stools. She denies any changes in her bowels habits. She denies any abdominal pains. She hasn't point with Dr. Donne Hazel next week to discuss bilateral mastectomy, and she will also require chemotherapy. She also has follow-up visit with Dr. Jana Hakim not scheduled. He denies any family history of gastric or pancreatic cancer. She's never had a prior upper endoscopy. Her father had colon cancer diagnosed in his 44s.  Prior colonoscopies: Colonoscopy 03/20/2017 - small cecal AVM, diverticulosis, no polyps Colonoscopy 09/10/2010 - normal Colonoscopy 07/30/2003 - normal  68 year old female with a history of bilateral breast cancer as well as lung cancer, here for reassessment of the following issues:  TP53 mutation - she has a history of malignancies as outlined above, now with this gene mutation which can be associated with Li-Fraumeni syndrome. We discussed with this entailed. Guidelines recommend colonoscopy every 2-5 years for screening purposes given increased risk for colon cancer. She had a colonoscopy last year so I don't think we need to perform one for at least another year. Further she's had 3 colonoscopies since 2000, none of which have shown any polyps, she may not need colonoscopy every 2 years but can discuss with her over time how frequent she wishes to have this done. Guidelines also recommend MRI of the brain annually as well as whole-body MRI to screen for  other malignancies. She has not had a cross-sectional imaging of her abdomen, but has had imaging of her chest routinely. I think MRI of the abdomen and pelvis is reasonable however defer to her visit with Dr. Jana Hakim regarding evaluation for other malignancies. She agreed. I will see her back in the office in one year for reassessment, in the interim she will be going through surgery and chemo for her breast cancer. All questions answered.   [place] Cellar, MD [facility]   Past Medical History:  Diagnosis Date   Anemia    BRCA negative 2011   BRCA I/ II negative   Breast cancer ([place]) 08/2009   stage 2, rx with lumpectomy and xrt   CHF (congestive heart failure) ([place])    Constipation    COPD (chronic obstructive pulmonary disease) (HCC)    pt.unsure of diagnosis status   Emphysema of lung ([place])    pt. questions diagnosis   Family history of adverse reaction to anesthesia    My Sister has nausea   Family history of breast cancer    Family history of colon cancer    Family history of lung cancer    GERD (gastroesophageal reflux disease)    not presently having symptoms   Hypertension    Lung cancer ([place]) 06/06/05   stage 1 poorly differentiated adenocarcinoma, s/p right lower lobectomy.   Osteoporosis    Personal history of lung cancer    Pneumonia    2007ish, walking pnemonia - 2009 ish   STD (sexually transmitted disease)    HSV     Past Surgical History:  Procedure  Laterality Date   BREAST BIOPSY     BREAST LUMPECTOMY  10/12/2009   Left lumpectomy and radiation, stage II, ER/PR+, Her 2 nu negative   BUNIONECTOMY Bilateral    COLONOSCOPY     COLONOSCOPY     LOBECTOMY Right 06/06/2005   [place]   MASTECTOMY W/ SENTINEL NODE BIOPSY Bilateral 03/15/2018   MASTECTOMY W/ SENTINEL NODE BIOPSY Bilateral 03/15/2018   Procedure: BILATERAL TOTAL MASTECTOMIES WITH RIGHT SENTINEL LYMPH NODE BIOPSY;  Surgeon: Rolm Bookbinder, MD;   Location: [place];  Service: General;  Laterality: Bilateral;   PORTACATH PLACEMENT N/A 03/15/2018   Procedure: INSERTION PORT-A-CATH WITH [place];  Surgeon: Rolm Bookbinder, MD;  Location: [facility];  Service: General;  Laterality: N/A;   [place]  02/06/2019   Flexible video fiberoptic bronchoscopy with electromagnetic navigation and biopsies.   VIDEO BRONCHOSCOPY WITH ENDOBRONCHIAL NAVIGATION Left 02/06/2019   Procedure: VIDEO BRONCHOSCOPY WITH ENDOBRONCHIAL NAVIGATION, left lung;  Surgeon: Collene Gobble, MD;  Location: [facility];  Service: Thoracic;  Laterality: Left;   Family History  Problem Relation Age of Onset   Allergies Mother    Asthma Mother    Lung cancer Mother    Breast cancer Mother 52       recurrence age 41   Colon cancer Father 8   Prostate cancer Brother 102   Breast cancer Sister 42       Recurrence age 46 BRCA negative   Colon polyps Sister    Leukemia Sister    Breast cancer Sister 10   Prostate cancer Brother 9   Breast cancer Maternal Grandmother 38   Colon cancer Maternal Aunt    Leukemia Maternal Grandfather    Lung cancer Maternal Aunt    Breast cancer Cousin    Esophageal cancer Neg Hx    Rectal cancer Neg Hx    Stomach cancer Neg Hx    Social History   Tobacco Use   Smoking status: Former Smoker    Packs/day: 2.00    Years: 18.00    Pack years: 36.00    Quit date: 07/12/1987    Years since quitting: 31.8   Smokeless tobacco: Never Used  Substance Use Topics   Alcohol use: Yes    Alcohol/week: 14.0 standard drinks    Types: 7 Glasses of wine, 7 Shots of liquor per week    Comment: social   Drug use: No   Current Outpatient Medications  Medication Sig Dispense Refill   acyclovir (ZOVIRAX) 400 MG tablet TAKE 1 TABLET BY MOUTH TWICE A DAY 180 tablet 1   calcium-vitamin D (OSCAL WITH D) 500-200 MG-UNIT tablet Take 1 tablet by mouth daily.     carvedilol (COREG) 6.25 MG  tablet Take 1 tablet (6.25 mg total) by mouth 2 (two) times daily. 180 tablet 3   cholecalciferol (VITAMIN D) 1000 UNITS tablet Take 1,000 Units by mouth daily.     lidocaine-prilocaine (EMLA) cream Apply 1 application topically as needed. 30 g 0   losartan (COZAAR) 25 MG tablet TAKE 1 TABLET BY MOUTH EVERY DAY (Patient taking differently: 2 (two) times daily. ) 90 tablet 1   Psyllium (METAMUCIL PO) Take 1 Scoop by mouth daily.      tamoxifen (NOLVADEX) 20 MG tablet Take 20 mg by mouth daily.     vitamin C (ASCORBIC ACID) 500 MG tablet Take 500 mg by mouth daily.     No current facility-administered medications for  this visit.    Allergies  Allergen Reactions   Taxol [Paclitaxel] Anaphylaxis   Tape Itching   Abraxane [Paclitaxel Protein-Bound Part] Rash   Clarithromycin Rash     Review of Systems: All systems reviewed and negative except where noted in HPI.    No results found.  Physical Exam: There were no vitals taken for this visit. Constitutional: Pleasant,well-developed, ***female in no acute distress. HEENT: Normocephalic and atraumatic. Conjunctivae are normal. No scleral icterus. Neck supple.  Cardiovascular: Normal rate, regular rhythm.  Pulmonary/chest: Effort normal and breath sounds normal. No wheezing, rales or rhonchi. Abdominal: Soft, nondistended, nontender. Bowel sounds active throughout. There are no masses palpable. No hepatomegaly. Extremities: no edema Lymphadenopathy: No cervical adenopathy noted. Neurological: Alert and oriented to person place and time. Skin: Skin is warm and dry. No rashes noted. Psychiatric: Normal mood and affect. Behavior is normal.   ASSESSMENT AND PLAN:  [name], MD

## 2019-05-10 ENCOUNTER — Ambulatory Visit: Payer: Medicare Other | Admitting: Gastroenterology

## 2019-05-25 ENCOUNTER — Other Ambulatory Visit: Payer: Self-pay | Admitting: Internal Medicine

## 2019-06-21 ENCOUNTER — Other Ambulatory Visit: Payer: Self-pay

## 2019-06-21 ENCOUNTER — Encounter: Payer: Self-pay | Admitting: Gastroenterology

## 2019-06-21 ENCOUNTER — Ambulatory Visit (INDEPENDENT_AMBULATORY_CARE_PROVIDER_SITE_OTHER): Payer: Medicare Other | Admitting: Gastroenterology

## 2019-06-21 VITALS — BP 126/70 | HR 72 | Temp 97.6°F | Ht 65.5 in | Wt 118.1 lb

## 2019-06-21 DIAGNOSIS — Z1509 Genetic susceptibility to other malignant neoplasm: Secondary | ICD-10-CM | POA: Diagnosis not present

## 2019-06-21 DIAGNOSIS — Z1502 Genetic susceptibility to malignant neoplasm of ovary: Secondary | ICD-10-CM | POA: Diagnosis not present

## 2019-06-21 DIAGNOSIS — Z1501 Genetic susceptibility to malignant neoplasm of breast: Secondary | ICD-10-CM | POA: Diagnosis not present

## 2019-06-21 NOTE — Patient Instructions (Signed)
You will be due for a recall colonoscopy in 10/2019. We will send you a reminder in the mail when it gets closer to that time.

## 2019-06-21 NOTE — Progress Notes (Signed)
HPI :  68 year old female here for follow-up visit for TP53 mutation / Li-Fraumeni syndrome.   She is a history of lung cancer, history of left breast cancer in 2011, history of recent diagnosis of right breast cancer in July 2019. She has previously tested positive for the TP53 gene, which can be associated with Li-Fraumeni syndrome. Genetics recommended consideration for colonoscopy every 2-5 years starting at age 80.   Her last colonoscopy was performed in 2018 which was normal without any polyps.  She has never had any prior colon polyps on her colonoscopy exams as below.  It was otherwise recommended that she have an MRI of her brain and whole body MRI for this condition.  She cannot do MRIs and had a CT scan of her head as well as a PET scan performed in June.  PET scan showed an abnormality of her lung, this led to bronchoscopy with biopsy.  That unfortunately was complicated by pneumothorax which kept her in the hospital for 5 days.  She has since recovered from that and is feeling well.  Biopsies of her lung did not show any evidence of lung cancer.  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  She been feeling well otherwise since then.  She has had a few episodes of loose stools but nothing that has been persistent.  She denies any abdominal pain.  She denies any blood in her stools.  She denies any reflux symptoms or dysphagia.  No weight loss.  Otherwise feeling pretty well. He denies any family history of gastric or pancreatic cancer. She's never had a prior upper endoscopy. Her father had colon cancer diagnosed in his 88s.  Prior colonoscopies: Colonoscopy 03/20/2017 - small cecal AVM, diverticulosis, no polyps Colonoscopy 09/10/2010 - normal Colonoscopy 07/30/2003 - normal    Past Medical History:  Diagnosis Date  . Anemia   . BRCA negative 2011   BRCA I/ II negative  . Breast cancer ([facility]) 08/2009   stage 2, rx with lumpectomy and xrt  . CHF (congestive heart failure) ([facility])   .  Constipation   . COPD (chronic obstructive pulmonary disease) ([facility])    pt.unsure of diagnosis status  . Emphysema of lung ([facility])    pt. questions diagnosis  . Family history of adverse reaction to anesthesia    My Sister has nausea  . Family history of breast cancer   . Family history of colon cancer   . Family history of lung cancer   . GERD (gastroesophageal reflux disease)    not presently having symptoms  . Hypertension   . Lung cancer ([facility]) 06/06/05   stage 1 poorly differentiated adenocarcinoma, s/p right lower lobectomy.  . Osteoporosis   . Personal history of lung cancer   . Pneumonia    2007ish, walking pnemonia - 2009 ish  . STD (sexually transmitted disease)    HSV     Past Surgical History:  Procedure Laterality Date  . BREAST BIOPSY    . BREAST LUMPECTOMY  10/12/2009   Left lumpectomy and radiation, stage II, ER/PR+, Her 2 nu negative  . BUNIONECTOMY Bilateral   . COLONOSCOPY    . COLONOSCOPY    . LOBECTOMY Right 06/06/2005   [place]  . MASTECTOMY W/ SENTINEL NODE BIOPSY Bilateral 03/15/2018  . MASTECTOMY W/ SENTINEL NODE BIOPSY Bilateral 03/15/2018   Procedure: BILATERAL TOTAL MASTECTOMIES WITH RIGHT SENTINEL LYMPH NODE BIOPSY;  Surgeon: Rolm Bookbinder, MD;  Location: [facility];  Service: General;  Laterality: Bilateral;  .  PORTACATH PLACEMENT N/A 03/15/2018   Procedure: INSERTION PORT-A-CATH WITH [place];  Surgeon: Rolm Bookbinder, MD;  Location: [facility];  Service: General;  Laterality: N/A;  . TONSILLECTOMY    . TUBAL LIGATION  1984  . VIDEO BRONCHOSCOPY  02/06/2019   Flexible video fiberoptic bronchoscopy with electromagnetic navigation and biopsies.  Marland Kitchen VIDEO BRONCHOSCOPY WITH ENDOBRONCHIAL NAVIGATION Left 02/06/2019   Procedure: VIDEO BRONCHOSCOPY WITH ENDOBRONCHIAL NAVIGATION, left lung;  Surgeon: Collene Gobble, MD;  Location: [facility];  Service: Thoracic;  Laterality: Left;   Family History  Problem Relation Age of Onset  . Allergies Mother   . Asthma Mother     . Lung cancer Mother   . Breast cancer Mother 33       recurrence age 56  . Colon cancer Father 48  . Prostate cancer Brother 70  . Breast cancer Sister 21       Recurrence age 34 BRCA negative  . Colon polyps Sister   . Leukemia Sister   . Breast cancer Sister 26  . Prostate cancer Brother 39  . Breast cancer Maternal Grandmother 60  . Colon cancer Maternal Aunt   . Leukemia Maternal Grandfather   . Lung cancer Maternal Aunt   . Breast cancer Cousin   . Esophageal cancer Neg Hx   . Rectal cancer Neg Hx   . Stomach cancer Neg Hx    Social History   Tobacco Use  . Smoking status: Former Smoker    Packs/day: 2.00    Years: 18.00    Pack years: 36.00    Quit date: 07/12/1987    Years since quitting: 31.9  . Smokeless tobacco: Never Used  Substance Use Topics  . Alcohol use: Yes    Alcohol/week: 14.0 standard drinks    Types: 7 Glasses of wine, 7 Shots of liquor per week    Comment: social  . Drug use: No   Current Outpatient Medications  Medication Sig Dispense Refill  . acyclovir (ZOVIRAX) 400 MG tablet TAKE 1 TABLET BY MOUTH TWICE A DAY 360 tablet 0  . calcium-vitamin D (OSCAL WITH D) 500-200 MG-UNIT tablet Take 1 tablet by mouth daily.    . carvedilol (COREG) 6.25 MG tablet Take 1 tablet (6.25 mg total) by mouth 2 (two) times daily. 180 tablet 3  . cholecalciferol (VITAMIN D) 1000 UNITS tablet Take 1,000 Units by mouth daily.    Marland Kitchen losartan (COZAAR) 25 MG tablet TAKE 1 TABLET BY MOUTH EVERY DAY (Patient taking differently: 2 (two) times daily. ) 90 tablet 1  . Psyllium (METAMUCIL PO) Take 1 Scoop by mouth daily.     . tamoxifen (NOLVADEX) 20 MG tablet Take 20 mg by mouth daily.    . vitamin C (ASCORBIC ACID) 500 MG tablet Take 500 mg by mouth daily.     No current facility-administered medications for this visit.   Allergies  Allergen Reactions  . Taxol [Paclitaxel] Anaphylaxis  . Tape Itching  . Abraxane [Paclitaxel Protein-Bound Part] Rash  . Clarithromycin  Rash     Review of Systems: All systems reviewed and negative except where noted in HPI.   Lab Results  Component Value Date   WBC 5.2 02/08/2019   HGB 10.3 (L) 02/08/2019   HCT 31.6 (L) 02/08/2019   MCV 97.8 02/08/2019   PLT 204 02/08/2019    Lab Results  Component Value Date   CREATININE 0.64 02/08/2019   BUN 10 02/08/2019   NA 137 02/08/2019   K 4.0 02/08/2019  CL 105 02/08/2019   CO2 25 02/08/2019    Lab Results  Component Value Date   ALT 7 01/28/2019   AST 15 01/28/2019   ALKPHOS 63 12/26/2018   BILITOT 0.4 01/28/2019     Physical Exam: BP 126/70 (BP Location: Left Arm, Patient Position: Sitting, Cuff Size: Normal)   Pulse 72   Temp 97.6 F (36.4 C)   Ht 5' 5.5" (1.664 m) Comment: height measured without shoes  Wt 118 lb 2 oz (53.6 kg)   BMI 19.36 kg/m  Constitutional: Pleasant,well-developed, female in no acute distress. HEENT: Normocephalic and atraumatic. Conjunctivae are normal. No scleral icterus. Neck supple.  Cardiovascular: Normal rate, regular rhythm.  Pulmonary/chest: Effort normal and breath sounds normal. No wheezing, rales or rhonchi. Abdominal: Soft, nondistended, nontender. There are no masses palpable. No hepatomegaly. Extremities: no edema Lymphadenopathy: No cervical adenopathy noted. Neurological: Alert and oriented to person place and time. Skin: Skin is warm and dry. No rashes noted. Psychiatric: Normal mood and affect. Behavior is normal.   ASSESSMENT AND PLAN: 68 year old female here for reassessment of the following:  TP53 mutation - she has a history of malignancies as outlined above, in the setting of TP53 mutation. She has had a CT scan of her head and a PET scan as outlined above (she is intolerant to MRI), this led to a lung biopsy which did not show any evidence of lung cancer.  Her prior colonoscopies have not had any polyps and that is reassuring, however guidelines recommend colonoscopy every 2-5 years for screening  purposes given increased risk for colon cancer with a TP53 mutation.  It has been a little over 2 years since her last exam, we discussed if she wanted to proceed with an exam now or wait another few years.  Her lab work is normal and she has no alarm symptoms, I do not think she would have anything high risk in her colon at this time.  After discussing this issue, she wants to do it sooner than later, but give it some time until the coronavirus has settled down a bit more in the vaccine is more widely available and perhaps come back the spring or summer 2021 for her next exam.  If her next exam is negative without any polyps, I think okay to extend the interval of her next colonoscopy.  She agreed with the plan, we will place a recall for her the spring however she can contact us at her convenience for direct booking when she is ready proceed if she wishes to do it sooner.   [place] Cellar, MD [address] [facility]

## 2019-07-02 ENCOUNTER — Telehealth: Payer: Self-pay | Admitting: Adult Health

## 2019-07-02 NOTE — Telephone Encounter (Signed)
R/s appt per 12/14 sch message - unable to reach pt . Left message with appt date and time

## 2019-07-15 ENCOUNTER — Other Ambulatory Visit: Payer: Self-pay | Admitting: *Deleted

## 2019-07-15 DIAGNOSIS — C3431 Malignant neoplasm of lower lobe, right bronchus or lung: Secondary | ICD-10-CM

## 2019-07-15 DIAGNOSIS — Z170 Estrogen receptor positive status [ER+]: Secondary | ICD-10-CM

## 2019-07-15 DIAGNOSIS — C50212 Malignant neoplasm of upper-inner quadrant of left female breast: Secondary | ICD-10-CM

## 2019-07-16 ENCOUNTER — Inpatient Hospital Stay (HOSPITAL_BASED_OUTPATIENT_CLINIC_OR_DEPARTMENT_OTHER): Payer: Medicare Other | Admitting: Adult Health

## 2019-07-16 ENCOUNTER — Other Ambulatory Visit: Payer: Self-pay

## 2019-07-16 ENCOUNTER — Inpatient Hospital Stay: Payer: Medicare Other | Attending: Adult Health

## 2019-07-16 ENCOUNTER — Encounter: Payer: Self-pay | Admitting: Adult Health

## 2019-07-16 VITALS — BP 131/70 | HR 80 | Temp 98.2°F | Resp 17 | Ht 65.5 in | Wt 113.8 lb

## 2019-07-16 DIAGNOSIS — C3431 Malignant neoplasm of lower lobe, right bronchus or lung: Secondary | ICD-10-CM | POA: Diagnosis not present

## 2019-07-16 DIAGNOSIS — Z888 Allergy status to other drugs, medicaments and biological substances status: Secondary | ICD-10-CM | POA: Diagnosis not present

## 2019-07-16 DIAGNOSIS — Z800 Family history of malignant neoplasm of digestive organs: Secondary | ICD-10-CM | POA: Diagnosis not present

## 2019-07-16 DIAGNOSIS — Z87891 Personal history of nicotine dependence: Secondary | ICD-10-CM | POA: Diagnosis not present

## 2019-07-16 DIAGNOSIS — R911 Solitary pulmonary nodule: Secondary | ICD-10-CM | POA: Diagnosis not present

## 2019-07-16 DIAGNOSIS — Z801 Family history of malignant neoplasm of trachea, bronchus and lung: Secondary | ICD-10-CM | POA: Diagnosis not present

## 2019-07-16 DIAGNOSIS — D649 Anemia, unspecified: Secondary | ICD-10-CM | POA: Insufficient documentation

## 2019-07-16 DIAGNOSIS — Z806 Family history of leukemia: Secondary | ICD-10-CM | POA: Insufficient documentation

## 2019-07-16 DIAGNOSIS — Z8042 Family history of malignant neoplasm of prostate: Secondary | ICD-10-CM | POA: Insufficient documentation

## 2019-07-16 DIAGNOSIS — Z85118 Personal history of other malignant neoplasm of bronchus and lung: Secondary | ICD-10-CM | POA: Diagnosis not present

## 2019-07-16 DIAGNOSIS — C50411 Malignant neoplasm of upper-outer quadrant of right female breast: Secondary | ICD-10-CM | POA: Diagnosis not present

## 2019-07-16 DIAGNOSIS — C50412 Malignant neoplasm of upper-outer quadrant of left female breast: Secondary | ICD-10-CM | POA: Insufficient documentation

## 2019-07-16 DIAGNOSIS — C50212 Malignant neoplasm of upper-inner quadrant of left female breast: Secondary | ICD-10-CM

## 2019-07-16 DIAGNOSIS — Z803 Family history of malignant neoplasm of breast: Secondary | ICD-10-CM | POA: Insufficient documentation

## 2019-07-16 DIAGNOSIS — Z8371 Family history of colonic polyps: Secondary | ICD-10-CM | POA: Diagnosis not present

## 2019-07-16 DIAGNOSIS — Z7289 Other problems related to lifestyle: Secondary | ICD-10-CM | POA: Diagnosis not present

## 2019-07-16 DIAGNOSIS — Z79899 Other long term (current) drug therapy: Secondary | ICD-10-CM | POA: Insufficient documentation

## 2019-07-16 DIAGNOSIS — Z170 Estrogen receptor positive status [ER+]: Secondary | ICD-10-CM

## 2019-07-16 DIAGNOSIS — Z9013 Acquired absence of bilateral breasts and nipples: Secondary | ICD-10-CM | POA: Diagnosis not present

## 2019-07-16 DIAGNOSIS — Z79810 Long term (current) use of selective estrogen receptor modulators (SERMs): Secondary | ICD-10-CM | POA: Diagnosis not present

## 2019-07-16 DIAGNOSIS — Z8249 Family history of ischemic heart disease and other diseases of the circulatory system: Secondary | ICD-10-CM | POA: Insufficient documentation

## 2019-07-16 DIAGNOSIS — Z836 Family history of other diseases of the respiratory system: Secondary | ICD-10-CM | POA: Insufficient documentation

## 2019-07-16 LAB — CMP (CANCER CENTER ONLY)
ALT: 7 U/L (ref 0–44)
AST: 15 U/L (ref 15–41)
Albumin: 3.7 g/dL (ref 3.5–5.0)
Alkaline Phosphatase: 66 U/L (ref 38–126)
Anion gap: 11 (ref 5–15)
BUN: 14 mg/dL (ref 8–23)
CO2: 27 mmol/L (ref 22–32)
Calcium: 8.9 mg/dL (ref 8.9–10.3)
Chloride: 102 mmol/L (ref 98–111)
Creatinine: 0.88 mg/dL (ref 0.44–1.00)
GFR, Est AFR Am: >60 mL/min (ref >60)
GFR, Estimated: >60 mL/min (ref >60)
Glucose, Bld: 95 mg/dL (ref 70–99)
Potassium: 3.9 mmol/L (ref 3.5–5.1)
Sodium: 140 mmol/L (ref 135–145)
Total Bilirubin: 0.4 mg/dL (ref 0.3–1.2)
Total Protein: 7.4 g/dL (ref 6.5–8.1)

## 2019-07-16 LAB — CBC WITH DIFFERENTIAL (CANCER CENTER ONLY)
Abs Immature Granulocytes: 0.02 10*3/uL (ref 0.00–0.07)
Basophils Absolute: 0 10*3/uL (ref 0.0–0.1)
Basophils Relative: 0 %
Eosinophils Absolute: 0.1 10*3/uL (ref 0.0–0.5)
Eosinophils Relative: 2 %
HCT: 32.9 % — ABNORMAL LOW (ref 36.0–46.0)
Hemoglobin: 10.5 g/dL — ABNORMAL LOW (ref 12.0–15.0)
Immature Granulocytes: 0 %
Lymphocytes Relative: 27 %
Lymphs Abs: 1.4 10*3/uL (ref 0.7–4.0)
MCH: 30.7 pg (ref 26.0–34.0)
MCHC: 31.9 g/dL (ref 30.0–36.0)
MCV: 96.2 fL (ref 80.0–100.0)
Monocytes Absolute: 0.6 10*3/uL (ref 0.1–1.0)
Monocytes Relative: 12 %
Neutro Abs: 3 10*3/uL (ref 1.7–7.7)
Neutrophils Relative %: 59 %
Platelet Count: 266 10*3/uL (ref 150–400)
RBC: 3.42 MIL/uL — ABNORMAL LOW (ref 3.87–5.11)
RDW: 12.7 % (ref 11.5–15.5)
WBC Count: 5.2 10*3/uL (ref 4.0–10.5)
nRBC: 0 % (ref 0.0–0.2)

## 2019-07-16 NOTE — Progress Notes (Signed)
[place]  Telephone:([phone]) [phone] [phone]) [phone]    ID: Anna Valentine DOB: [birth year]  MR#: [id number]  LEX#:[id number]  Patient Care Team: [name], MD as PCP - General (Internal Medicine) Magrinat, Virgie Dad, MD as Consulting Physician (Oncology) Collene Gobble, MD as Consulting Physician (Pulmonary Disease) [name], MD as Referring Physician Armbruster, Carlota Raspberry, MD as Consulting Physician (Gastroenterology) Kem Boroughs, [place] (Family Medicine) [name], MD as Consulting Physician (Radiation Oncology) Rolm Bookbinder, MD as Consulting Physician (General Surgery) Larey Dresser, MD as Consulting Physician (Cardiology) Bensimhon, Shaune Pascal, MD as Consulting Physician (Cardiology)   CHIEF COMPLAINT:  (1) Estrogen receptor positive left-sided breast cancer (2011)    (2) history of stage IA right lung adenocarcinoma resected November 2006    (3) triple- positive right-sided breast cancer    (4) homozygous p53 mutation/ Li-Fraumeni syndrome   CURRENT TREATMENT: tamoxifen   INTERVAL HISTORY: Anna Valentine was seen today for follow-up and treatment of her triple positive breast cancer.  She continues on Tamoxifen with good tolerance.  She has some mild vaginal spotting that is light and appears like a tiny drop once every week.  Anna Valentine underwent a bronchoscopy and biopsy in 02/2019 that unfortunately resulted in pneumothorax.  She was admitted.  It revealed MAC.  She has not started on antibiotics and is waiting repeating CT scan due later this month.  This is scheduled on 08/02/2019.  REVIEW OF SYSTEMS: She is up to date with colonoscopy and follows with Dr. Havery Moros, and is awaiting covid vaccination and then she will schedule this.    Andrell is walking her dog twice a day.  She is following the appropriate pandemic precautions.  She notes she was originally due to undergo her f/u in October, however her husband who has MDS had a CVA,  and underwent pacemaker placement and carotid endarterectomy.  He sounds like he has improved since that time.  Anna Valentine does not see gynecology regularly.  She has not had a skin cancer check recently and notes that she needs to do this once things settle down with the pandemic and once she gets vaccinated against COVID.  She denies any new issues such as fever, chills, chest pain, palpitations, new cough/shortness of breath, chest pain, palpitations, bowel/bladder changes, nausea, vomiting, night sweats or unintentional weight loss.  A detailed ROS Was otherwise non contributory.    LUNG CANCER HISTORY:  From Dr. [name] original intake note 07/26/2005: (Lung cancer presentation)  "The patient is a very pleasant 69 year old female without a significant past medical history, who states that in 04/2005 she felt a lump in the left neck.  It did not resolve spontaneously, so she was seen by Dr. [name] [facility], and a CT scan of the neck was performed on 04/25/05.  There was noted to be two lymph nodes on the left, and they were normal in size, but they were asymmetrical.  No other pathologic findings were noted, except for left internal jugular vein, which was atypically positioned.  However, because of these enlarged lymph nodes, a CT scan of the chest was also obtained by Dr. Renold Genta on 04/27/05, and the CT scan did reveal a poorly defined nodular opacity medially in the right upper lobe, measuring 6.5 mg anterior to posterior.  Within the right lower lobe there was noted to be a lobular nodular mass measuring 13 x 13 mm, and was felt to be worrisome for a primary lung carcinoma.  No other nodules or  effusions were seen on the left lung.  On the mediastinal window images there was slight prominence of right hilar nodes, but no other evidence of mediastinal or hilar adenopathy.  A PET scan was thereafter obtained on 05/06/05.  The PET scan did reveal increased FDG activity within a small mass in the  posterior superior right lower lobe, highly suspicious for a malignancy.  No significant nodal FDG uptake was identified within the hilar regions or the mediastinum.  Also noted was abnormal FDG uptake in the right middle lobe, corresponding to the CT scan findings, and again suspicious for a malignancy.  There was no evidence of metastatic disease within the neck, abdomen or pelvis.  The patient was then referred to [facility], and was seen by Dr. [name].  The patient underwent a right lower lobe wedge resection with completion lobectomy and mediastinal lymph node biopsies.  The pathology revealed the following:  Right upper lobe wedge resection revealed pulmonary tissue with necrotizing granulomatous inflammation.  No evidence of malignancy.  Right middle lobe wedge revealed again pulmonary tissue with necrotizing granulomatous inflammation.  No evidence of malignancy.  The right pleural lobe (lobectomy) revealed an adenocarcinoma, poorly differentiated, grade 3, measuring 1.2 cm.  The visceral pleura was negative.  Chest wall negative.  Mediastinum negative.  All margins were negative, including the pleural and parenchymal margins.  There was no evidence of lymphovascular invasion.  In all lymph nodes sampled, including level 11, level 7, level 12, 4R and 2R lymph nodes negative for malignancy.  The patient was staged as T1 N0 M0, stage IA adenocarcinoma of the right lung.  Postoperatively the patient's course was complicated by the development of pleural effusion, requiring diuresis.  She had multiple x-rays performed.  Last x-ray performed on 07/07/05 revealed persistent right pleural effusion.  It was recommended that the patient have followups at [facility].  Dr. [name] Baxley kindly refers the patient to me today for medical oncology evaluation.  Clinically, the patient states that she has recovered well.  She is once again walking.  She was a runner, and she is trying to get back in shape."  LEFT  BREAST CANCER HISTORY:From [name]'s 09/30/2009 note:  "She tells me that most recently she had her yearly screening mammogram performed that revealed an abnormality.  She also on exam was noted to have a palpable lump in the upper left breast as well.  Because of the abnormality, patient on September 02, 2009 had a digital diagnostic mammogram of the left breast and ultrasound of the left breast.  The mammogram revealed a 7.0 mm area slightly increased density in the upper left breast.  There were no suspicious calcifications noted.  The breast was heterogeneously dense.  Patient then went onto have an ultrasound of the breast performed and again a 7.0 mm irregular hypoechoic shadowing mass was noted in the 12 o'clock position of the left breast 5.0 cm from the nipple.  There were no enlarged or abnormal left axillary lymph nodes identified.  Patient went onto have a core biopsy performed on 09/02/2009 [phone]).  The needle core biopsy of the 12 o'clock mass revealed an invasive mammary carcinoma, low to intermediate grade.  It was lobular carcinoma.  Confirmatory immunohistochemical stains revealed the tumor to be strongly positive for cytokeratin AE1-AE3 with an infiltrative pattern of growth and the tumor was negative for E-cadherin stain confirming a lobular phenotype.  The tumor was estrogen receptor positive at 98%, progesterone receptor positive 6%, proliferation  marker Ki-67 by MIB was 14%.  The tumor did not express HER-2/neu by CISH with a signal of 1.04.  Patient was seen by Dr. Neldon Mc on 09/08/2009 for discussion of surgical options.  His recommendation was a lumpectomy.  Patient also had bilateral MRI of the breasts performed, which revealed a 0.8 cm mildly enlarged area of mass-like enhancement at the 12 o'clock location corresponding to the biopsy-proven breast cancer.  There was no evidence of lymphadenopathy."   PAST MEDICAL HISTORY: Past Medical History:  Diagnosis Date   . Anemia   . BRCA negative 2011   BRCA I/ II negative  . Breast cancer ([place]) 08/2009   stage 2, rx with lumpectomy and xrt  . CHF (congestive heart failure) ([place])   . Constipation   . COPD (chronic obstructive pulmonary disease) ([facility])    pt.unsure of diagnosis status  . Emphysema of lung ([place])    pt. questions diagnosis  . Family history of adverse reaction to anesthesia    My Sister has nausea  . Family history of breast cancer   . Family history of colon cancer   . Family history of lung cancer   . GERD (gastroesophageal reflux disease)    not presently having symptoms  . Hypertension   . Lung cancer ([place]) 06/06/05   stage 1 poorly differentiated adenocarcinoma, s/p right lower lobectomy.  . Osteoporosis   . Personal history of lung cancer   . Pneumonia    2007ish, walking pnemonia - 2009 ish  . STD (sexually transmitted disease)    HSV    PAST SURGICAL HISTORY: Past Surgical History:  Procedure Laterality Date  . BREAST BIOPSY    . BREAST LUMPECTOMY  10/12/2009   Left lumpectomy and radiation, stage II, ER/PR+, Her 2 nu negative  . BUNIONECTOMY Bilateral   . COLONOSCOPY    . COLONOSCOPY    . LOBECTOMY Right 06/06/2005   [place]  . MASTECTOMY W/ SENTINEL NODE BIOPSY Bilateral 03/15/2018  . MASTECTOMY W/ SENTINEL NODE BIOPSY Bilateral 03/15/2018   Procedure: BILATERAL TOTAL MASTECTOMIES WITH RIGHT SENTINEL LYMPH NODE BIOPSY;  Surgeon: Rolm Bookbinder, MD;  Location: [place];  Service: General;  Laterality: Bilateral;  . PORTACATH PLACEMENT N/A 03/15/2018   Procedure: INSERTION PORT-A-CATH WITH [place];  Surgeon: Rolm Bookbinder, MD;  Location: [place] [facility];  Service: General;  Laterality: N/A;  . TONSILLECTOMY    . TUBAL LIGATION  1984  . VIDEO BRONCHOSCOPY  02/06/2019   Flexible video fiberoptic bronchoscopy with electromagnetic navigation and biopsies.  Marland Kitchen VIDEO BRONCHOSCOPY WITH ENDOBRONCHIAL NAVIGATION Left 02/06/2019   Procedure: VIDEO BRONCHOSCOPY WITH ENDOBRONCHIAL  NAVIGATION, left lung;  Surgeon: Collene Gobble, MD;  Location: [facility];  Service: Thoracic;  Laterality: Left;    FAMILY HISTORY Family History  Problem Relation Age of Onset  . Allergies Mother   . Asthma Mother   . Lung cancer Mother   . Breast cancer Mother 36       recurrence age 72  . Colon cancer Father 51  . Prostate cancer Brother 51  . Breast cancer Sister 53       Recurrence age 63 BRCA negative  . Colon polyps Sister   . Leukemia Sister   . Breast cancer Sister 65  . Prostate cancer Brother 74  . Breast cancer Maternal Grandmother 84  . Colon cancer Maternal Aunt   . Leukemia Maternal Grandfather   . Lung cancer Maternal Aunt   . Breast cancer Cousin   . Esophageal cancer  Neg Hx   . Rectal cancer Neg Hx   . Stomach cancer Neg Hx    The patient's maternal grandmother was diagnosed with cancer at age 61. Patient's mother was diagnosed at age 55. The patient's sister was diagnosed at age 6 with recurrence at 71 and is BRCA negative; she was also diagnosed with leukemia and underwent a bone marrow transplant before passing in 11/2018. A second sister was diagnosed at age 41.   GYNECOLOGIC HISTORY:  No LMP recorded. Patient is postmenopausal. Menarche age 13, the patient is GX P0. She went through the change of life in approximately age 17. She did not take hormone replacement.   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 It's just the 2 of them at home. She takes care of 84-year-old for a neighbor and is the primary caregiver for her husband who has had a couple of small strokes and has myelodysplasia. She helps a friend out with her business. She walks her 70 year old lab. 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 Smoking status: Former Smoker    Packs/day: 2.00    Years:  18.00    Pack years: 36.00    Quit date: 07/12/1987    Years since quitting: 32.0  . Smokeless tobacco: Never Used  Substance Use Topics  . Alcohol use: Yes    Alcohol/week: 14.0 standard drinks    Types: 7 Glasses of wine, 7 Shots of liquor per week    Comment: social  . Drug use: No    Allergies  Allergen Reactions  . Taxol [Paclitaxel] Anaphylaxis  . Tape Itching  . Abraxane [Paclitaxel Protein-Bound Part] Rash  . Clarithromycin Rash    Current Outpatient Medications  Medication Sig Dispense Refill  . acyclovir (ZOVIRAX) 400 MG tablet TAKE 1 TABLET BY MOUTH TWICE A DAY 360 tablet 0  . calcium-vitamin D (OSCAL WITH D) 500-200 MG-UNIT tablet Take 1 tablet by mouth daily.    . carvedilol (COREG) 6.25 MG tablet Take 1 tablet (6.25 mg total) by mouth 2 (two) times daily. 180 tablet 3  . cholecalciferol (VITAMIN D) 1000 UNITS tablet Take 1,000 Units by mouth daily.    Marland Kitchen losartan (COZAAR) 25 MG tablet TAKE 1 TABLET BY MOUTH EVERY DAY (Patient taking differently: 2 (two) times daily. ) 90 tablet 1  . Psyllium (METAMUCIL PO) Take 1 Scoop by mouth daily.     . tamoxifen (NOLVADEX) 20 MG tablet Take 20 mg by mouth daily.    . vitamin C (ASCORBIC ACID) 500 MG tablet Take 500 mg by mouth daily.     No current facility-administered medications for this visit.    OBJECTIVE: Middle-aged white woman in no acute distress  Vitals:   07/16/19 1446  BP: 131/70  Pulse: 80  Resp: 17  Temp: 98.2 F (36.8 C)  SpO2: 100%     Body mass index is 18.65 kg/m.    ECOG FS:1 - Symptomatic but completely ambulatory  GENERAL: Patient is a well appearing female in no acute distress HEENT:  Sclerae anicteric.  Oropharynx clear and moist. No ulcerations or evidence of oropharyngeal candidiasis. Neck is supple.  NODES:  No cervical, supraclavicular, or axillary lymphadenopathy palpated.  BREAST EXAM: s/p bilateral mastectomies, no sign of local recurrence noted. LUNGS:  Clear to auscultation  bilaterally.  No wheezes or rhonchi. HEART:  Regular rate and rhythm. No murmur appreciated. ABDOMEN:  Soft, nontender.  Positive, normoactive bowel sounds. No organomegaly palpated. MSK:  No focal spinal tenderness to palpation. Full range of motion bilaterally in the upper extremities. EXTREMITIES:  No peripheral edema.   SKIN:  Clear with no obvious rashes or skin changes. No nail dyscrasia. NEURO:  Nonfocal. Well oriented.  Appropriate affect.     CMP     Component Value Date/Time   NA 137 02/08/2019 0641   NA 139 12/22/2016 1141   K 4.0 02/08/2019 0641   K 4.2 12/22/2016 1141   CL 105 02/08/2019 0641   CL 103 11/22/2012 1025   CO2 25 02/08/2019 0641   CO2 28 12/22/2016 1141   GLUCOSE 95 02/08/2019 0641   GLUCOSE 92 12/22/2016 1141   GLUCOSE 94 11/22/2012 1025   BUN 10 02/08/2019 0641   BUN 13.2 12/22/2016 1141   CREATININE 0.64 02/08/2019 0641   CREATININE 0.67 01/28/2019 0936   CREATININE 0.7 12/22/2016 1141   CALCIUM 8.6 (L) 02/08/2019 0641   CALCIUM 9.7 12/22/2016 1141   PROT 6.7 01/28/2019 0936   PROT 7.0 12/22/2016 1141   ALBUMIN 3.8 12/26/2018 1126   ALBUMIN 3.8 12/22/2016 1141   AST 15 01/28/2019 0936   AST 18 12/22/2016 1141   ALT 7 01/28/2019 0936   ALT 10 12/22/2016 1141   ALKPHOS 63 12/26/2018 1126   ALKPHOS 81 12/22/2016 1141   BILITOT 0.4 01/28/2019 0936   BILITOT 0.65 12/22/2016 1141   GFRNONAA >60 02/08/2019 0641   GFRNONAA 91 01/28/2019 0936   GFRAA >60 02/08/2019 0641   GFRAA 105 01/28/2019 0936    INo results found for: SPEP, UPEP  Lab Results  Component Value Date   WBC 5.2 07/16/2019   NEUTROABS 3.0 07/16/2019   HGB 10.5 (L) 07/16/2019   HCT 32.9 (L) 07/16/2019   MCV 96.2 07/16/2019   PLT 266 07/16/2019      Chemistry      Component Value Date/Time   NA 137 02/08/2019 0641   NA 139 12/22/2016 1141   K 4.0 02/08/2019 0641   K 4.2 12/22/2016 1141   CL 105 02/08/2019 0641   CL 103 11/22/2012 1025   CO2 25 02/08/2019 0641    CO2 28 12/22/2016 1141   BUN 10 02/08/2019 0641   BUN 13.2 12/22/2016 1141   CREATININE 0.64 02/08/2019 0641   CREATININE 0.67 01/28/2019 0936   CREATININE 0.7 12/22/2016 1141      Component Value Date/Time   CALCIUM 8.6 (L) 02/08/2019 0641   CALCIUM 9.7 12/22/2016 1141   ALKPHOS 63 12/26/2018 1126   ALKPHOS 81 12/22/2016 1141   AST 15 01/28/2019 0936   AST 18 12/22/2016 1141   ALT 7 01/28/2019 0936   ALT 10 12/22/2016 1141   BILITOT 0.4 01/28/2019 0936   BILITOT 0.65 12/22/2016 1141       Lab Results  Component Value Date   LABCA2 18 09/30/2009    No components found for: [id number]  No results for input(s): INR in the last 168 hours.  Urinalysis    Component Value Date/Time   BILIRUBINUR NEG 02/04/2019 1111   PROTEINUR Negative 02/04/2019 1111   UROBILINOGEN 0.2 02/04/2019 1111   NITRITE NEG 02/04/2019 1111   LEUKOCYTESUR Negative 02/04/2019 1111    STUDIES: No results found.   ASSESSMENT: 69 y.o. [place], [place] woman status post right upper lobe wedge resection, middle lobe wedge resection, lower lobectomy and  mediastinal lymph node dissection 06/06/2005 for a 1.2 cm grade 3 adenocarcinoma, pT1 pN1, stage Ia  (a) followed at [facility] with every other year chest CT, most recently 06/06/2017  LEFT BREAST CANCER: (1) status post left breast upper outer quadrant biopsy 09/02/2009 for an invasive lobular carcinoma (E-cadherin negative) estrogen receptor 98% positive, progesterone receptor 6% positive, with an MIB-1 of 14% and no HER-2 amplification. [[id number]]  (2) status post left lumpectomy and sentinel lymph node sampling 10/12/2009 for a pT1b pN1, stage IIA invasive lobular breast cancer, with negative margins.  (3) Oncotype DX score of 16 predicts a risk of outside the breast recurrence of 10% if the patient's only systemic treatment is tamoxifen for 5 years. It also predicts no benefit from adjuvant chemotherapy  (4) completed adjuvant radiation therapy  01/28/2010, receiving 5040 cGy to the left breast, with a boost to the upper inner aspect of the breast (to a cumulative dose of 6300 cGy); the axillary and supraclavicular regions received 4500 cGy  (5) started anastrozole July 2011, completedI 5 years July 2016  RIGHT BREAST CANCER: (6) status post right breast upper outer quadrant biopsy 01/29/2018 for a clinical  T1c N0, stage IA invasive ductal carcinoma, grade 2, estrogen receptor positive, progesterone receptor negative, HER-2 amplified, with an MIB-1 of 15%  (7) status post bilateral mastectomy with right sentinel lymph node sampling 03/15/2018, showing  (a) on the left, no malignancy noted  (b) on the right, a pT1c pN0,stage IA invasive ductal carcinoma, grade 2, with negative margins.  (c) a total of 6 lymph nodes removed from the right axilla, none from the left  (8) chemotherapy consisting of paclitaxel weekly x12 started 04/26/2018, with trastuzumab to be given for 1 year  (a) myocardial perfusion study 12/20/2017 showed an ejection fraction of 75% (hyperdynamic  (b) paclitaxel switched to Abraxane with the dose #2 because of initial reaction to Taxol  (c) Abraxane discontinued 07/13/2018 with continuing side effects  (d) received a total of 10 paclitaxel last Abraxane doses of 12 planned  (9) adjuvant radiation not indicated  (10) tamoxifen started 08/01/2018  (11) bone density 02/21/2012 at [facility] showed osteoporosis with a T score of -2.5; on alendronate  (12) repeat genetics testing 02/08/2018 offered through [facility] Common Hereditary Cancers Panel and 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 There were no deleterious mutations in: APC, ATM, AXIN2, BARD1, BMPR1A, BRCA1, BRCA2, BRIP1, CDH1, CDKN2A (p14ARF), CDKN2A (p16INK4a), CKD4, CHEK2, CTNNA1, DICER1, EPCAM (Deletion/duplication testing only), GREM1 (promoter region deletion/duplication testing only), KIT, MEN1, MLH1, MSH2, MSH3, MSH6, MUTYH, NBN,  NF1, NHTL1, PALB2, PDGFRA, PMS2, POLD1, POLE, PTEN, RAD50, RAD51C, RAD51D, SDHB, SDHC, SDHD, SMAD4, SMARCA4. STK11, TSC1, TSC2, and VHL.  The following genes were evaluated for sequence changes only: SDHA and HOXB13 c.251G>A variant only.  (a) see "cancer surveillance" for details of long term Maylon Peppers related screening studies   (i) s/p bilateral mastectomies   (ii) colonoscopy recommended every 2 to 5 years: Next scheduled September 2020   (iii) brain MRI every year: However patient has significant claustrophobia   (iiii) consider referral to a Li-Fraumeni syndrome clinic  PLAN: Franchelle is doing well today.  Her CBC shows a persistent stable anemia.  She has no sign of breast cancer recurrence and is tolerating Tamoxifen well.    We reviewed her intermittent very mild vaginal spotting.  Though this is very light, and not bothersome to her, I recommended she undergo gynecology evaluation, and ultrasound of her uterus.  I reviewed that the Tamoxifen can thicken the lining of the uterus and cause vaginal bleeding, but also, she has a TP53 mutation, which increases her risk of getting different cancers.  She notes that she wants to wait until after she has the COVID19 vaccine to undergo the ultrasound.  She will call us so that we can place those orders.  I recommended that if it worsens at all, we should follow the appropriate pandemic precautions and have her undergo the ultrasound prior to the vaccine.  She understands this.  Ayomide has been doing well and will continue to f/u with Dr. Lamonte Sakai regarding the MAC/lung nodule with CT chest on 07/31/2020.    Loriel and I reviewed healthy lifestyle activities including healthy diet, exercise, cancer screenings, and skin protection.  I also gave her a handout on bone health in her AVS.    [facility] will return in 6 months for labs and f/u with Dr. Jana Hakim.  She was recommended to continue with the appropriate pandemic precautions. She knows to call  for any questions that may arise between now and her next appointment.  We are happy to see her sooner if needed.  A total of (20) minutes of face-to-face time was spent with this patient with greater than 50% of that time in counseling and care-coordination.   Wilber Bihari, NP  07/16/19 2:51 PM Medical Oncology and Hematology [facility] [address] [place], [place] [address] Tel. [phone]    Fax. [phone]

## 2019-07-16 NOTE — Progress Notes (Unsigned)
lym

## 2019-07-16 NOTE — Patient Instructions (Signed)

## 2019-07-17 ENCOUNTER — Telehealth: Payer: Self-pay | Admitting: Oncology

## 2019-07-17 NOTE — Telephone Encounter (Signed)
I talk with patient regarding schedule  

## 2019-07-29 ENCOUNTER — Telehealth (INDEPENDENT_AMBULATORY_CARE_PROVIDER_SITE_OTHER): Payer: Medicare Other | Admitting: Primary Care

## 2019-07-29 ENCOUNTER — Encounter: Payer: Self-pay | Admitting: Primary Care

## 2019-07-29 DIAGNOSIS — J471 Bronchiectasis with (acute) exacerbation: Secondary | ICD-10-CM

## 2019-07-29 NOTE — Progress Notes (Signed)
Virtual Visit via Video Note  I connected with Anna Valentine on 07/29/19 at 11:30 AM EST by a video enabled telemedicine application and verified that I am speaking with the correct person using two identifiers.  Location: Patient: Home Provider: Office   I discussed the limitations of evaluation and management by telemedicine and the availability of in person appointments. The patient expressed understanding and agreed to proceed.  History of Present Illness: 69 year old female, former smoker quit 1989 (36-pack-year history).  Medical history significant for malignant neoplasm right lower lobe lung, postprocedural pneumothorax, bronchiectasis, MAI, breast cancer.  Patient of Dr. Lamonte Sakai, last seen 03/26/2019.  She had a bronchoscopy this fall which confirmed granulomatous disease and now Mycobacterium avium.  During this time she is asymptomatic.  Treatment for Mycobacterium was deferred and she was scheduled for a repeat CT of her chest in January 2021 to establish stability versus change.  07/29/2019 Patient contacted today for acute visit.  Reports productive cough with thick clear - green mucus for 3-4 weeks. She normally does not have a cough has baseline. No struggle getting mucus up. She has an upcoming CT chest scheduled Friday 08/02/19. She experiences trace amount of blood in mucus. Report that this does not happen every day. Reports that couugh is not a large issue for her. She is not taking anything for cough. She is not on a blood thinner. No other symptoms. Afebrile.   Observations/Objective:  - Appears well on video visit. Able to speak in full sentences - No shortness of breath, respiratory distress, wheezing or active cough   Assessment and Plan:  Bronchiectasis with acute exacerbation: - Reports new productive cough with clear-green sputum x 3-4 weeks. Rare hemoptysis.  - Deferring antibiotic treatment at this time due to upcoming chest CT scheduled for this Friday.  Depending on results may need treatment for Mycobacterium avium.  - Recommend cough suppressant such as delsym 41ml twice daily - Monitor hemoptysis, if continues or worsens she will notify office   Follow Up Instructions:   -Follow-up with Dr. Lamonte Sakai after CT chest for further plan/management  I discussed the assessment and treatment plan with the patient. The patient was provided an opportunity to ask questions and all were answered. The patient agreed with the plan and demonstrated an understanding of the instructions.   The patient was advised to call back or seek an in-person evaluation if the symptoms worsen or if the condition fails to improve as anticipated.  I provided 18 minutes of non-face-to-face time during this encounter.   Martyn Ehrich, NP

## 2019-07-29 NOTE — Patient Instructions (Addendum)
Do not recommend antibiotic treatment at this time Lets get Chest CT first and from there decide next steps Monitor for worsening mucus production or blood in mucus- notify office if this increases  Try delsym cough syrup 66ml twice daily for cough suppression

## 2019-08-02 ENCOUNTER — Other Ambulatory Visit: Payer: Self-pay

## 2019-08-02 ENCOUNTER — Encounter (HOSPITAL_COMMUNITY): Payer: Self-pay

## 2019-08-02 ENCOUNTER — Ambulatory Visit (HOSPITAL_COMMUNITY)
Admission: RE | Admit: 2019-08-02 | Discharge: 2019-08-02 | Disposition: A | Discharge status: Home / Self Care | Payer: Medicare Other | Source: Ambulatory Visit | Attending: Emergency Medicine | Admitting: Emergency Medicine

## 2019-08-02 DIAGNOSIS — R911 Solitary pulmonary nodule: Secondary | ICD-10-CM | POA: Diagnosis not present

## 2019-08-02 DIAGNOSIS — I700 Atherosclerosis of aorta: Secondary | ICD-10-CM | POA: Diagnosis not present

## 2019-08-02 DIAGNOSIS — J479 Bronchiectasis, uncomplicated: Secondary | ICD-10-CM | POA: Diagnosis not present

## 2019-08-02 DIAGNOSIS — I2510 Atherosclerotic heart disease of native coronary artery without angina pectoris: Secondary | ICD-10-CM | POA: Diagnosis not present

## 2019-08-02 DIAGNOSIS — J438 Other emphysema: Secondary | ICD-10-CM | POA: Diagnosis not present

## 2019-08-05 ENCOUNTER — Other Ambulatory Visit: Payer: Self-pay | Admitting: Oncology

## 2019-08-05 ENCOUNTER — Other Ambulatory Visit (HOSPITAL_COMMUNITY): Payer: Self-pay | Admitting: Cardiology

## 2019-08-08 NOTE — Telephone Encounter (Signed)
Dr. Lamonte Sakai, please advise on pt email, thanks!  I had a chest CT on Jan. 22nd.  I have read the results on MyChart.  Do I need to make an appointment to discuss the results or will Dr. Lamonte Sakai just review them with me over the phone?

## 2019-08-08 NOTE — Telephone Encounter (Signed)
Discussed Ct with pt. I have asked her to consider possible [facility] treatment vs waiting and repeating her CT chest. She has had some wt loss. She had green mucous last few days, with some blood on a few occasions.

## 2019-08-19 ENCOUNTER — Telehealth (HOSPITAL_COMMUNITY): Payer: Self-pay | Admitting: Cardiology

## 2019-08-19 MED ORDER — LOSARTAN POTASSIUM 25 MG PO TABS: 25.0000 mg | ORAL_TABLET | Freq: Two times a day (BID) | ORAL | 3 refills | Status: DC

## 2019-08-19 NOTE — Telephone Encounter (Signed)
Patient called to request an updated rx for losartan  Reports she was told to take BID and rx at pharmacy is daily Updated rx sent

## 2019-08-20 NOTE — Telephone Encounter (Signed)
I discussed The CT findings with her today, answered her questions. She would like to defer rx for [facility] at this time, plan to repeat a CT chest without contrast in 6 months (July) to assess for interval change. If she changes clinically then we may do sooner (or I suppose later, possibly 12 months). We will review her status then

## 2019-09-06 DIAGNOSIS — T1511XA Foreign body in conjunctival sac, right eye, initial encounter: Secondary | ICD-10-CM | POA: Diagnosis not present

## 2019-09-13 ENCOUNTER — Telehealth: Payer: Self-pay | Admitting: *Deleted

## 2019-09-13 ENCOUNTER — Telehealth: Payer: Self-pay | Admitting: Oncology

## 2019-09-13 NOTE — Telephone Encounter (Signed)
Pt left VM stating she feels she needs to see Dr Jannifer Rodney due to ongoing unintended weight loss.  This RN attempted to return call- obtained identified VM- message left informing pt appointment request will be placed.

## 2019-09-13 NOTE — Telephone Encounter (Signed)
Error

## 2019-09-13 NOTE — Telephone Encounter (Signed)
Scheduled app per 3/5 sch message - pt aware of appt date and time

## 2019-09-19 NOTE — Progress Notes (Signed)
Anna Valentine  [phone] [phone]) [phone]    ID: MIRAGE PFEFFERKORN DOB: [birth year]  MR#: [id number]  CWC#:[id number]  Patient Care Team: [name], MD as PCP - General (Internal Medicine) Shaqueta Casady, Virgie Dad, MD as Consulting Physician (Oncology) Collene Gobble, MD as Consulting Physician (Pulmonary Disease) [name], MD as Referring Physician Armbruster, Carlota Raspberry, MD as Consulting Physician (Gastroenterology) Kem Boroughs, [place] (Family Medicine) [name], MD as Consulting Physician (Radiation Oncology) Rolm Bookbinder, MD as Consulting Physician (General Surgery) Larey Dresser, MD as Consulting Physician (Cardiology) Bensimhon, Shaune Pascal, MD as Consulting Physician (Cardiology)   CHIEF COMPLAINT:  (1) Estrogen receptor positive left-sided breast cancer (2011)    (2) history of stage IA right lung adenocarcinoma resected November 2006    (3) triple- positive right-sided breast cancer    (4) homozygous p53 mutation/ Li-Fraumeni syndrome   CURRENT TREATMENT: observation   INTERVAL HISTORY: Anna Valentine returns today for follow-up of her triple positive breast cancer and history of homozygous p53 mutation.. She requested to be seen today due to ongoing unintended weight loss.  Since her last visit, she underwent super D chest CT on 08/02/2019 for follow up of her pneumothorax. This showed: interval development of multiple areas of ill-defined tree-in-bud nodularity in left lower lobe suggesting atypical infection;new 6 mm nodule in lingula, may reflect sequelae of atypical infection;slight interval increase in size of precarinal lymphadenopathy.  She has discussed treatment for her MAC infection with Dr. Lamonte Sakai but is worried that the treatment may cause altered taste and further weight loss.   REVIEW OF SYSTEMS: Anna Valentine tells me her husband Barbarann Ehlers had a stroke October 2020, got a pacemaker placed and had his carotids "cleaned out".  He has  not quite recovered from this so she has "the weight of the house" on her, walking her 68 year old dog for example but also taking care of pretty much everything else.  She feels up to it and is not depressed by this.  She tells me that she has a dry cough, occasionally paroxysmal, occasionally productive of clear mucus.  She has had no hemoptysis.  She has had no intercurrent fever.  She is able to walk up a steep slope without stopping and without significant shortness of breath.  She tells me her appetite is normal.  Occasionally she has diarrhea spells.  She has not been able to associate this very clearly with any particular foods.  A detailed review of systems today was otherwise stable.   LUNG CANCER HISTORY:  From Dr. [name] original intake note 07/26/2005: (Lung cancer presentation)  "The patient is a very pleasant 69 year old female without a significant past medical history, who states that in 04/2005 she felt a lump in the left neck.  It did not resolve spontaneously, so she was seen by Dr. [name] [facility], and a CT scan of the neck was performed on 04/25/05.  There was noted to be two lymph nodes on the left, and they were normal in size, but they were asymmetrical.  No other pathologic findings were noted, except for left internal jugular vein, which was atypically positioned.  However, because of these enlarged lymph nodes, a CT scan of the chest was also obtained by Dr. Renold Genta on 04/27/05, and the CT scan did reveal a poorly defined nodular opacity medially in the right upper lobe, measuring 6.5 mg anterior to posterior.  Within the right lower lobe there was noted to be a lobular nodular mass measuring 13  x 13 mm, and was felt to be worrisome for a primary lung carcinoma.  No other nodules or effusions were seen on the left lung.  On the mediastinal window images there was slight prominence of right hilar nodes, but no other evidence of mediastinal or hilar adenopathy.  A PET scan was  thereafter obtained on 05/06/05.  The PET scan did reveal increased FDG activity within a small mass in the posterior superior right lower lobe, highly suspicious for a malignancy.  No significant nodal FDG uptake was identified within the hilar regions or the mediastinum.  Also noted was abnormal FDG uptake in the right middle lobe, corresponding to the CT scan findings, and again suspicious for a malignancy.  There was no evidence of metastatic disease within the neck, abdomen or pelvis.  The patient was then referred to [facility], and was seen by Dr. Otilio Connors.  The patient underwent a right lower lobe wedge resection with completion lobectomy and mediastinal lymph node biopsies.  The pathology revealed the following:  Right upper lobe wedge resection revealed pulmonary tissue with necrotizing granulomatous inflammation.  No evidence of malignancy.  Right middle lobe wedge revealed again pulmonary tissue with necrotizing granulomatous inflammation.  No evidence of malignancy.  The right pleural lobe (lobectomy) revealed an adenocarcinoma, poorly differentiated, grade 3, measuring 1.2 cm.  The visceral pleura was negative.  Chest wall negative.  Mediastinum negative.  All margins were negative, including the pleural and parenchymal margins.  There was no evidence of lymphovascular invasion.  In all lymph nodes sampled, including level 11, level 7, level 12, 4R and 2R lymph nodes negative for malignancy.  The patient was staged as T1 N0 M0, stage IA adenocarcinoma of the right lung.  Postoperatively the patients course was complicated by the development of pleural effusion, requiring diuresis.  She had multiple x-rays performed.  Last x-ray performed on 07/07/05 revealed persistent right pleural effusion.  It was recommended that the patient have followups at [facility].  Dr. [name] Baxley kindly refers the patient to me today for medical oncology evaluation.  Clinically, the patient states that she has recovered  well.  She is once again walking.  She was a runner, and she is trying to get back in shape."  LEFT BREAST CANCER HISTORY:From [name]'s 09/30/2009 note:  "She tells me that most recently she had her yearly screening mammogram performed that revealed an abnormality.  She also on exam was noted to have a palpable lump in the upper left breast as well.  Because of the abnormality, patient on September 02, 2009 had a digital diagnostic mammogram of the left breast and ultrasound of the left breast.  The mammogram revealed a 7.0 mm area slightly increased density in the upper left breast.  There were no suspicious calcifications noted.  The breast was heterogeneously dense.  Patient then went onto have an ultrasound of the breast performed and again a 7.0 mm irregular hypoechoic shadowing mass was noted in the 12 oclock position of the left breast 5.0 cm from the nipple.  There were no enlarged or abnormal left axillary lymph nodes identified.  Patient went onto have a core biopsy performed on 09/02/2009 [phone]).  The needle core biopsy of the 12 oclock mass revealed an invasive mammary carcinoma, low to intermediate grade.  It was lobular carcinoma.  Confirmatory immunohistochemical stains revealed the tumor to be strongly positive for cytokeratin AE1-AE3 with an infiltrative pattern of growth and the tumor was negative for E-cadherin  stain confirming a lobular phenotype.  The tumor was estrogen receptor positive at 98%, progesterone receptor positive 6%, proliferation marker Ki-67 by MIB was 14%.  The tumor did not express HER-2/neu by CISH with a signal of 1.04.  Patient was seen by Dr. Neldon Mc on 09/08/2009 for discussion of surgical options.  His recommendation was a lumpectomy.  Patient also had bilateral MRI of the breasts performed, which revealed a 0.8 cm mildly enlarged area of mass-like enhancement at the 12 oclock location corresponding to the biopsy-proven breast cancer.  There  was no evidence of lymphadenopathy."   PAST MEDICAL HISTORY: Past Medical History:  Diagnosis Date   Anemia    BRCA negative 2011   BRCA I/ II negative   Breast cancer ([place]) 08/2009   stage 2, rx with lumpectomy and xrt   CHF (congestive heart failure) ([place])    Constipation    COPD (chronic obstructive pulmonary disease) ([facility])    pt.unsure of diagnosis status   Emphysema of lung ([facility])    pt. questions diagnosis   Family history of adverse reaction to anesthesia    My Sister has nausea   Family history of breast cancer    Family history of colon cancer    Family history of lung cancer    GERD (gastroesophageal reflux disease)    not presently having symptoms   Hypertension    Lung cancer ([place]) 06/06/05   stage 1 poorly differentiated adenocarcinoma, s/p right lower lobectomy.   Osteoporosis    Personal history of lung cancer    Pneumonia    2007ish, walking pnemonia - 2009 ish   STD (sexually transmitted disease)    HSV    PAST SURGICAL HISTORY: Past Surgical History:  Procedure Laterality Date   BREAST BIOPSY     BREAST LUMPECTOMY  10/12/2009   Left lumpectomy and radiation, stage II, ER/PR+, Her 2 nu negative   BUNIONECTOMY Bilateral    COLONOSCOPY     COLONOSCOPY     LOBECTOMY Right 06/06/2005   [place]   MASTECTOMY W/ SENTINEL NODE BIOPSY Bilateral 03/15/2018   MASTECTOMY W/ SENTINEL NODE BIOPSY Bilateral 03/15/2018   Procedure: BILATERAL TOTAL MASTECTOMIES WITH RIGHT SENTINEL LYMPH NODE BIOPSY;  Surgeon: Rolm Bookbinder, MD;  Location: [place];  Service: General;  Laterality: Bilateral;   PORTACATH PLACEMENT N/A 03/15/2018   Procedure: INSERTION PORT-A-CATH WITH [place];  Surgeon: Rolm Bookbinder, MD;  Location: [place];  Service: General;  Laterality: N/A;   [place]  02/06/2019   Flexible video fiberoptic bronchoscopy with electromagnetic navigation and biopsies.   VIDEO BRONCHOSCOPY  WITH ENDOBRONCHIAL NAVIGATION Left 02/06/2019   Procedure: VIDEO BRONCHOSCOPY WITH ENDOBRONCHIAL NAVIGATION, left lung;  Surgeon: Collene Gobble, MD;  Location: [facility];  Service: Thoracic;  Laterality: Left;    FAMILY HISTORY Family History  Problem Relation Age of Onset   Allergies Mother    Asthma Mother    Lung cancer Mother    Breast cancer Mother 37       recurrence age 15   Colon cancer Father 5   Prostate cancer Brother 91   Breast cancer Sister 61       Recurrence age 43 BRCA negative   Colon polyps Sister    Leukemia Sister    Breast cancer Sister 66   Prostate cancer Brother 47   Breast cancer Maternal Grandmother 45   Colon cancer Maternal Aunt    Leukemia Maternal  Grandfather    Lung cancer Maternal Aunt    Breast cancer Cousin    Esophageal cancer Neg Hx    Rectal cancer Neg Hx    Stomach cancer Neg Hx    The patient's maternal grandmother was diagnosed with cancer at age 55. Patient's mother was diagnosed at age 9. The patient's sister was diagnosed at age 41 with recurrence at 77 and is BRCA negative; she was also diagnosed with leukemia and underwent a bone marrow transplant before passing in 11/2018. A second sister was diagnosed at age 54.   GYNECOLOGIC HISTORY:  No LMP recorded. Patient is postmenopausal. Menarche age 27, the patient is GX P0. She went through the change of life in approximately age 60. She did not take hormone replacement.   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 It's just the 2 of them at home. She takes care of 74-year-old for a neighbor and is the primary caregiver for her husband who has had a couple of small strokes and has myelodysplasia. She helps a friend out with her business. She walks her 68 year old lab. 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    ADVANCED DIRECTIVES: In place   HEALTH  MAINTENANCE: Social History   Tobacco Use   Smoking status: Former Smoker    Packs/day: 2.00    Years: 18.00    Pack years: 36.00    Quit date: 07/12/1987    Years since quitting: 32.2   Smokeless tobacco: Never Used  Substance Use Topics   Alcohol use: Yes    Alcohol/week: 14.0 standard drinks    Types: 7 Glasses of wine, 7 Shots of liquor per week    Comment: social   Drug use: No    Allergies  Allergen Reactions   Taxol [Paclitaxel] Anaphylaxis   Tape Itching   Abraxane [Paclitaxel Protein-Bound Part] Rash   Clarithromycin Rash    Current Outpatient Medications  Medication Sig Dispense Refill   acyclovir (ZOVIRAX) 400 MG tablet TAKE 1 TABLET BY MOUTH TWICE A DAY 360 tablet 0   calcium-vitamin D (OSCAL WITH D) 500-200 MG-UNIT tablet Take 1 tablet by mouth daily.     carvedilol (COREG) 6.25 MG tablet TAKE 1 TABLET BY MOUTH TWICE A DAY 180 tablet 3   cholecalciferol (VITAMIN D) 1000 UNITS tablet Take 1,000 Units by mouth daily.     losartan (COZAAR) 25 MG tablet Take 1 tablet (25 mg total) by mouth 2 (two) times daily. 180 tablet 3   Psyllium (METAMUCIL PO) Take 1 Scoop by mouth daily.      tamoxifen (NOLVADEX) 20 MG tablet TAKE 1 TABLET BY MOUTH EVERY DAY 90 tablet 12   vitamin C (ASCORBIC ACID) 500 MG tablet Take 500 mg by mouth daily.     No current facility-administered medications for this visit.    OBJECTIVE: Middle-aged white woman who appears stated age  69:   09/20/19 1016  BP: 134/70  Pulse: 70  Resp: 20  Temp: 98 F (36.7 C)  SpO2: 100%     Body mass index is 18.37 kg/m.    ECOG FS:1 - Symptomatic but completely ambulatory  Sclerae unicteric, EOMs intact Wearing a mask No cervical or supraclavicular adenopathy Lungs no rales or rhonchi, no wheezes, good excursion bilaterally Heart regular rate and rhythm Abd soft, nontender, positive bowel sounds, no masses palpated MSK no focal spinal tenderness, no upper extremity  lymphedema  Neuro: nonfocal, well oriented, appropriate affect Breasts: Status post bilateral mastectomies.  No evidence of chest wall recurrence.  Both axillae are benign.  LAB RESULTS:  CMP     Component Value Date/Time   NA 139 09/20/2019 1004   NA 139 12/22/2016 1141   K 4.4 09/20/2019 1004   K 4.2 12/22/2016 1141   CL 103 09/20/2019 1004   CL 103 11/22/2012 1025   CO2 28 09/20/2019 1004   CO2 28 12/22/2016 1141   GLUCOSE 83 09/20/2019 1004   GLUCOSE 92 12/22/2016 1141   GLUCOSE 94 11/22/2012 1025   BUN 16 09/20/2019 1004   BUN 13.2 12/22/2016 1141   CREATININE 0.71 09/20/2019 1004   CREATININE 0.67 01/28/2019 0936   CREATININE 0.7 12/22/2016 1141   CALCIUM 9.1 09/20/2019 1004   CALCIUM 9.7 12/22/2016 1141   PROT 7.0 09/20/2019 1004   PROT 7.0 12/22/2016 1141   ALBUMIN 3.3 (L) 09/20/2019 1004   ALBUMIN 3.8 12/22/2016 1141   AST 18 09/20/2019 1004   AST 18 12/22/2016 1141   ALT 11 09/20/2019 1004   ALT 10 12/22/2016 1141   ALKPHOS 53 09/20/2019 1004   ALKPHOS 81 12/22/2016 1141   BILITOT 0.3 09/20/2019 1004   BILITOT 0.65 12/22/2016 1141   GFRNONAA >60 09/20/2019 1004   GFRNONAA 91 01/28/2019 0936   GFRAA >60 09/20/2019 1004   GFRAA 105 01/28/2019 0936    INo results found for: SPEP, UPEP  Lab Results  Component Value Date   WBC 5.3 09/20/2019   NEUTROABS 3.6 09/20/2019   HGB 10.7 (L) 09/20/2019   HCT 33.4 (L) 09/20/2019   MCV 94.6 09/20/2019   PLT 302 09/20/2019      Chemistry      Component Value Date/Time   NA 139 09/20/2019 1004   NA 139 12/22/2016 1141   K 4.4 09/20/2019 1004   K 4.2 12/22/2016 1141   CL 103 09/20/2019 1004   CL 103 11/22/2012 1025   CO2 28 09/20/2019 1004   CO2 28 12/22/2016 1141   BUN 16 09/20/2019 1004   BUN 13.2 12/22/2016 1141   CREATININE 0.71 09/20/2019 1004   CREATININE 0.67 01/28/2019 0936   CREATININE 0.7 12/22/2016 1141      Component Value Date/Time   CALCIUM 9.1 09/20/2019 1004   CALCIUM 9.7 12/22/2016  1141   ALKPHOS 53 09/20/2019 1004   ALKPHOS 81 12/22/2016 1141   AST 18 09/20/2019 1004   AST 18 12/22/2016 1141   ALT 11 09/20/2019 1004   ALT 10 12/22/2016 1141   BILITOT 0.3 09/20/2019 1004   BILITOT 0.65 12/22/2016 1141       Lab Results  Component Value Date   LABCA2 18 09/30/2009    No components found for: [id number]  No results for input(s): INR in the last 168 hours.  Urinalysis    Component Value Date/Time   BILIRUBINUR NEG 02/04/2019 1111   PROTEINUR Negative 02/04/2019 1111   UROBILINOGEN 0.2 02/04/2019 1111   NITRITE NEG 02/04/2019 1111   LEUKOCYTESUR Negative 02/04/2019 1111    STUDIES: No results found.   ASSESSMENT: 70 y.o. [place], [place] woman status post right upper lobe wedge resection, middle lobe wedge resection, lower lobectomy and mediastinal lymph node dissection 06/06/2005 for a 1.2 cm grade 3 adenocarcinoma, pT1 pN1, stage Ia  (a) followed [facility] with every other year chest CT, most recently 06/06/2017  LEFT BREAST CANCER: (1) status post left breast upper outer quadrant biopsy 09/02/2009 for an invasive lobular  carcinoma (E-cadherin negative) estrogen receptor 98% positive, progesterone receptor 6% positive, with an MIB-1 of 14% and no HER-2 amplification. [SAA [id number]]  (2) status post left lumpectomy and sentinel lymph node sampling 10/12/2009 for a pT1b pN1, stage IIA invasive lobular breast cancer, with negative margins.  (3) Oncotype DX score of 16 predicts a risk of outside the breast recurrence of 10% if the patient's only systemic treatment is tamoxifen for 5 years. It also predicts no benefit from adjuvant chemotherapy  (4) completed adjuvant radiation therapy 01/28/2010, receiving 5040 cGy to the left breast, with a boost to the upper inner aspect of the breast (to a cumulative dose of 6300 cGy); the axillary and supraclavicular regions received 4500 cGy  (5) started anastrozole July 2011, completedI 5 years July 2016  RIGHT BREAST  CANCER: (6) status post right breast upper outer quadrant biopsy 01/29/2018 for a clinical  T1c N0, stage IA invasive ductal carcinoma, grade 2, estrogen receptor positive, progesterone receptor negative, HER-2 amplified, with an MIB-1 of 15%  (7) status post bilateral mastectomy with right sentinel lymph node sampling 03/15/2018, showing  (a) on the left, no malignancy noted  (b) on the right, a pT1c pN0,stage IA invasive ductal carcinoma, grade 2, with negative margins.  (c) a total of 6 lymph nodes removed from the right axilla, none from the left  (8) chemotherapy consisting of paclitaxel weekly x12 started 04/26/2018, with trastuzumab to be given for 1 year  (a) myocardial perfusion study 12/20/2017 showed an ejection fraction of 75% (hyperdynamic  (b) paclitaxel switched to Abraxane with the dose #2 because of initial reaction to Taxol  (c) Abraxane discontinued 07/13/2018 with continuing side effects  (d) received a total of 10 paclitaxel last Abraxane doses of 12 planned  (9) adjuvant radiation not indicated  (10) tamoxifen started 08/01/2018, on hold after August 2020 because of spotting  (11) bone density 02/21/2012 at [place] showed osteoporosis with a T score of -2.5; on alendronate  (12) repeat genetics testing 02/08/2018 offered through [facility] Common Hereditary Cancers Panel and STAT panel showed a pathogenic variant in TP53 Anna375G>A (silent). There were no deleterious mutations in: APC, ATM, AXIN2, BARD1, BMPR1A, BRCA1, BRCA2, BRIP1, CDH1, CDKN2A (p14ARF), CDKN2A (p16INK4a), CKD4, CHEK2, CTNNA1, DICER1, EPCAM (Deletion/duplication testing only), GREM1 (promoter region deletion/duplication testing only), KIT, MEN1, MLH1, MSH2, MSH3, MSH6, MUTYH, NBN, NF1, NHTL1, PALB2, PDGFRA, PMS2, POLD1, POLE, PTEN, RAD50, RAD51C, RAD51D, SDHB, SDHC, SDHD, SMAD4, SMARCA4. STK11, TSC1, TSC2, and VHL.  The following genes were evaluated for sequence changes only: SDHA and HOXB13 Anna251G>A variant  only.  (a) see "cancer surveillance" for details of long term Maylon Peppers related screening studies   (i) s/p bilateral mastectomies   (ii) colonoscopy recommended every 2 to 5 years: Next scheduled September 2020   (iii) brain MRI every year: However patient has significant claustrophobia   (iiii) consider referral to a Li-Fraumeni syndrome clinic   PLAN: Lamiah was very concerned about her weight and we reviewed all her weights here down to 2012.  She generally hangs around 120, 2 pounds.  More recently she has been slowly drifting down to 118, 116, and now the current 112.  That is on our scales.  On her scale she is down to 102.  I think this is not cancer related.  It is likely multifactorial.  Having an MAC infection is anyway not a big deal but on the other hand it is a stress on her body and her immune system and that uses  up some energy.  She has some evidence of malabsorption in her bouts of diarrhea which are intermittent and not that frequent but nevertheless present.  Finally she is under significant stress because of her home situation, although I picked up no evidence of depression on today's visit.  I reassured her I do not think her weight loss is anything to do with cancer.  We reviewed her recent scan and incidentally she is scheduled for repeat scanning in July.  She is also working with Dr. Havery Moros and is planning to have a colonoscopy sometime this year.  I suggested she reconsider treatment for MAC, which might improve things all around.  We talked about increasing carbohydrates in her diet.  She has a good protein intake.  I also suggested she exercise a little more, walking obviously, but also some light weights for upper body.  She has an appointment with me in July but if everything is stable or improved by then she can cancel that and see me a year from now instead  She knows to call for any other issue that may develop before then.  Total encounter time 30  minutes.Anna Valentine C. Maty Zeisler, MD 09/20/19 10:46 AM Medical Oncology and Hematology [facility] [place], [place] [address] Tel. [phone]    Fax. [phone]    I, Wilburn Mylar, am acting as scribe for Dr. Virgie Dad. Henrietta Cieslewicz.  I, Lurline Del MD, have reviewed the above documentation for accuracy and completeness, and I agree with the above.    *Total Encounter Time as defined by the Centers for Medicare and Medicaid Services includes, in addition to the face-to-face time of a patient visit (documented in the note above) non-face-to-face time: obtaining and reviewing outside history, ordering and reviewing medications, tests or procedures, care coordination (communications with other health care professionals or caregivers) and documentation in the medical record.

## 2019-09-20 ENCOUNTER — Other Ambulatory Visit: Payer: Self-pay

## 2019-09-20 ENCOUNTER — Inpatient Hospital Stay: Payer: Medicare Other

## 2019-09-20 ENCOUNTER — Inpatient Hospital Stay: Payer: Medicare Other | Attending: Adult Health | Admitting: Oncology

## 2019-09-20 VITALS — BP 134/70 | HR 70 | Temp 98.0°F | Resp 20 | Ht 65.5 in | Wt 112.1 lb

## 2019-09-20 DIAGNOSIS — Z800 Family history of malignant neoplasm of digestive organs: Secondary | ICD-10-CM | POA: Diagnosis not present

## 2019-09-20 DIAGNOSIS — E559 Vitamin D deficiency, unspecified: Secondary | ICD-10-CM | POA: Diagnosis not present

## 2019-09-20 DIAGNOSIS — C50212 Malignant neoplasm of upper-inner quadrant of left female breast: Secondary | ICD-10-CM

## 2019-09-20 DIAGNOSIS — J479 Bronchiectasis, uncomplicated: Secondary | ICD-10-CM | POA: Diagnosis not present

## 2019-09-20 DIAGNOSIS — Z85118 Personal history of other malignant neoplasm of bronchus and lung: Secondary | ICD-10-CM | POA: Insufficient documentation

## 2019-09-20 DIAGNOSIS — Z9013 Acquired absence of bilateral breasts and nipples: Secondary | ICD-10-CM | POA: Diagnosis not present

## 2019-09-20 DIAGNOSIS — R634 Abnormal weight loss: Secondary | ICD-10-CM | POA: Diagnosis not present

## 2019-09-20 DIAGNOSIS — Z888 Allergy status to other drugs, medicaments and biological substances status: Secondary | ICD-10-CM | POA: Diagnosis not present

## 2019-09-20 DIAGNOSIS — Z803 Family history of malignant neoplasm of breast: Secondary | ICD-10-CM | POA: Diagnosis not present

## 2019-09-20 DIAGNOSIS — M8580 Other specified disorders of bone density and structure, unspecified site: Secondary | ICD-10-CM | POA: Diagnosis not present

## 2019-09-20 DIAGNOSIS — Z8371 Family history of colonic polyps: Secondary | ICD-10-CM | POA: Diagnosis not present

## 2019-09-20 DIAGNOSIS — C3431 Malignant neoplasm of lower lobe, right bronchus or lung: Secondary | ICD-10-CM

## 2019-09-20 DIAGNOSIS — M810 Age-related osteoporosis without current pathological fracture: Secondary | ICD-10-CM | POA: Insufficient documentation

## 2019-09-20 DIAGNOSIS — Z801 Family history of malignant neoplasm of trachea, bronchus and lung: Secondary | ICD-10-CM | POA: Insufficient documentation

## 2019-09-20 DIAGNOSIS — Z8249 Family history of ischemic heart disease and other diseases of the circulatory system: Secondary | ICD-10-CM | POA: Diagnosis not present

## 2019-09-20 DIAGNOSIS — Z806 Family history of leukemia: Secondary | ICD-10-CM | POA: Insufficient documentation

## 2019-09-20 DIAGNOSIS — C50811 Malignant neoplasm of overlapping sites of right female breast: Secondary | ICD-10-CM

## 2019-09-20 DIAGNOSIS — Z8042 Family history of malignant neoplasm of prostate: Secondary | ICD-10-CM | POA: Diagnosis not present

## 2019-09-20 DIAGNOSIS — R197 Diarrhea, unspecified: Secondary | ICD-10-CM | POA: Diagnosis not present

## 2019-09-20 DIAGNOSIS — C50411 Malignant neoplasm of upper-outer quadrant of right female breast: Secondary | ICD-10-CM | POA: Diagnosis not present

## 2019-09-20 DIAGNOSIS — Z87891 Personal history of nicotine dependence: Secondary | ICD-10-CM | POA: Diagnosis not present

## 2019-09-20 DIAGNOSIS — Z170 Estrogen receptor positive status [ER+]: Secondary | ICD-10-CM | POA: Insufficient documentation

## 2019-09-20 DIAGNOSIS — Z79899 Other long term (current) drug therapy: Secondary | ICD-10-CM | POA: Diagnosis not present

## 2019-09-20 DIAGNOSIS — Z836 Family history of other diseases of the respiratory system: Secondary | ICD-10-CM | POA: Insufficient documentation

## 2019-09-20 DIAGNOSIS — Z853 Personal history of malignant neoplasm of breast: Secondary | ICD-10-CM | POA: Diagnosis not present

## 2019-09-20 LAB — CBC WITH DIFFERENTIAL (CANCER CENTER ONLY)
Abs Immature Granulocytes: 0.03 10*3/uL (ref 0.00–0.07)
Basophils Absolute: 0 10*3/uL (ref 0.0–0.1)
Basophils Relative: 0 %
Eosinophils Absolute: 0.1 10*3/uL (ref 0.0–0.5)
Eosinophils Relative: 2 %
HCT: 33.4 % — ABNORMAL LOW (ref 36.0–46.0)
Hemoglobin: 10.7 g/dL — ABNORMAL LOW (ref 12.0–15.0)
Immature Granulocytes: 1 %
Lymphocytes Relative: 18 %
Lymphs Abs: 0.9 10*3/uL (ref 0.7–4.0)
MCH: 30.3 pg (ref 26.0–34.0)
MCHC: 32 g/dL (ref 30.0–36.0)
MCV: 94.6 fL (ref 80.0–100.0)
Monocytes Absolute: 0.6 10*3/uL (ref 0.1–1.0)
Monocytes Relative: 11 %
Neutro Abs: 3.6 10*3/uL (ref 1.7–7.7)
Neutrophils Relative %: 68 %
Platelet Count: 302 10*3/uL (ref 150–400)
RBC: 3.53 MIL/uL — ABNORMAL LOW (ref 3.87–5.11)
RDW: 12.1 % (ref 11.5–15.5)
WBC Count: 5.3 10*3/uL (ref 4.0–10.5)
nRBC: 0 % (ref 0.0–0.2)

## 2019-09-20 LAB — CMP (CANCER CENTER ONLY)
ALT: 11 U/L (ref 0–44)
AST: 18 U/L (ref 15–41)
Albumin: 3.3 g/dL — ABNORMAL LOW (ref 3.5–5.0)
Alkaline Phosphatase: 53 U/L (ref 38–126)
Anion gap: 8 (ref 5–15)
BUN: 16 mg/dL (ref 8–23)
CO2: 28 mmol/L (ref 22–32)
Calcium: 9.1 mg/dL (ref 8.9–10.3)
Chloride: 103 mmol/L (ref 98–111)
Creatinine: 0.71 mg/dL (ref 0.44–1.00)
GFR, Est AFR Am: >60 mL/min (ref >60)
GFR, Estimated: >60 mL/min (ref >60)
Glucose, Bld: 83 mg/dL (ref 70–99)
Potassium: 4.4 mmol/L (ref 3.5–5.1)
Sodium: 139 mmol/L (ref 135–145)
Total Bilirubin: 0.3 mg/dL (ref 0.3–1.2)
Total Protein: 7 g/dL (ref 6.5–8.1)

## 2019-09-26 ENCOUNTER — Telehealth: Payer: Self-pay | Admitting: Oncology

## 2019-09-26 NOTE — Telephone Encounter (Signed)
Scheduled per los, patient has been called and voicemail was left. 

## 2019-10-16 ENCOUNTER — Encounter (INDEPENDENT_AMBULATORY_CARE_PROVIDER_SITE_OTHER): Payer: Medicare Other | Admitting: Ophthalmology

## 2019-11-19 DIAGNOSIS — Z1501 Genetic susceptibility to malignant neoplasm of breast: Secondary | ICD-10-CM | POA: Diagnosis not present

## 2019-11-19 DIAGNOSIS — C50411 Malignant neoplasm of upper-outer quadrant of right female breast: Secondary | ICD-10-CM | POA: Diagnosis not present

## 2019-12-02 ENCOUNTER — Telehealth: Payer: Self-pay

## 2019-12-02 ENCOUNTER — Ambulatory Visit
Admission: RE | Admit: 2019-12-02 | Discharge: 2019-12-02 | Disposition: A | Discharge status: Home / Self Care | Payer: Medicare Other | Source: Ambulatory Visit | Attending: Internal Medicine | Admitting: Internal Medicine

## 2019-12-02 ENCOUNTER — Ambulatory Visit (INDEPENDENT_AMBULATORY_CARE_PROVIDER_SITE_OTHER): Payer: Medicare Other | Admitting: Internal Medicine

## 2019-12-02 ENCOUNTER — Other Ambulatory Visit: Payer: Self-pay

## 2019-12-02 ENCOUNTER — Encounter: Payer: Self-pay | Admitting: Internal Medicine

## 2019-12-02 VITALS — BP 110/80 | HR 88 | Temp 101.8°F | Ht 65.5 in

## 2019-12-02 DIAGNOSIS — J22 Unspecified acute lower respiratory infection: Secondary | ICD-10-CM | POA: Diagnosis not present

## 2019-12-02 DIAGNOSIS — Z85118 Personal history of other malignant neoplasm of bronchus and lung: Secondary | ICD-10-CM

## 2019-12-02 DIAGNOSIS — Z853 Personal history of malignant neoplasm of breast: Secondary | ICD-10-CM | POA: Diagnosis not present

## 2019-12-02 DIAGNOSIS — A310 Pulmonary mycobacterial infection: Secondary | ICD-10-CM | POA: Diagnosis not present

## 2019-12-02 DIAGNOSIS — R509 Fever, unspecified: Secondary | ICD-10-CM | POA: Diagnosis not present

## 2019-12-02 DIAGNOSIS — E871 Hypo-osmolality and hyponatremia: Secondary | ICD-10-CM | POA: Diagnosis not present

## 2019-12-02 DIAGNOSIS — R0602 Shortness of breath: Secondary | ICD-10-CM | POA: Diagnosis not present

## 2019-12-02 LAB — CBC WITH DIFFERENTIAL/PLATELET
Absolute Monocytes: 925 cells/uL (ref 200–950)
Basophils Absolute: 20 cells/uL (ref 0–200)
Basophils Relative: 0.3 %
Eosinophils Absolute: 121 cells/uL (ref 15–500)
Eosinophils Relative: 1.8 %
HCT: 30.5 % — ABNORMAL LOW (ref 35.0–45.0)
Hemoglobin: 10.1 g/dL — ABNORMAL LOW (ref 11.7–15.5)
Lymphs Abs: 972 cells/uL (ref 850–3900)
MCH: 29.5 pg (ref 27.0–33.0)
MCHC: 33.1 g/dL (ref 32.0–36.0)
MCV: 89.2 fL (ref 80.0–100.0)
MPV: 9.8 fL (ref 7.5–12.5)
Monocytes Relative: 13.8 %
Neutro Abs: 4663 cells/uL (ref 1500–7800)
Neutrophils Relative %: 69.6 %
Platelets: 361 10*3/uL (ref 140–400)
RBC: 3.42 10*6/uL — ABNORMAL LOW (ref 3.80–5.10)
RDW: 12.7 % (ref 11.0–15.0)
Total Lymphocyte: 14.5 %
WBC: 6.7 10*3/uL (ref 3.8–10.8)

## 2019-12-02 MED ORDER — LEVOFLOXACIN 500 MG PO TABS: 500.0000 mg | ORAL_TABLET | Freq: Every day | ORAL | 0 refills | Status: DC

## 2019-12-02 MED ORDER — ALBUTEROL SULFATE HFA 108 (90 BASE) MCG/ACT IN AERS: 2.0000 | INHALATION_SPRAY | Freq: Four times a day (QID) | RESPIRATORY_TRACT | 99 refills | Status: DC | PRN

## 2019-12-02 MED ORDER — CEFTRIAXONE SODIUM 1 G IJ SOLR
1.0000 g | Freq: Once | INTRAMUSCULAR | Status: AC
  Administered 2019-12-02: 1 g via INTRAMUSCULAR

## 2019-12-02 NOTE — Progress Notes (Addendum)
   Subjective:    Patient ID: Anna Valentine, female    DOB: 16-Apr-1951, 69 y.o.   MRN: [id number]  HPI 69 year old Female with history of poorly differentiated adenocarcinoma 2006 stage T1N0 treated at [facility]  History of triple positive breast cancer followed by Dr. Jana Hakim.  Chest CT in January 2021 showed interval development of multiple areas of ill-defined tree-in-bud nodularity in left lower lobe suggesting atypical infection and a new 6 mm nodule in the lingula and slight interval increase in size of precarinal lymphadenopathy.  Has been found to have MAC infection and is followed by Dr. Lamonte Sakai.  In 2006 she had a poorly defined nodular opacity mainly in the right upper lobe however within the right lower lobe there was a lobular nodular mass measuring 13 x 13 mm felt to be a primary lung carcinoma.  PET scan revealed increased activity within a small mass in the posterior superior right lower lobe.  She had right upper lobe wedge resection by Dr. [name] and mediastinal lymph node biopsies.  No evidence of malignancy in the right upper lobe or right middle lobe.  Mass in right lower lobe was a carcinoma poorly differentiated grade 3 measuring 1.2 cm.  No evidence of lymphovascular invasion.  Lymph nodes were negative  Husband passed away recently due to complications of an MI.  She lost her dog who is 36 years old.  At the funeral on May 19, a number of people were present.  Someone stated her husband who apparently was ill.  She has had both COVID-19 immunizations in March.  She is being tested for COVID-19 today at my office as well as a respiratory virus panel being obtained.  She called saying she had cough fever shortness of breath and chills since Friday, May 21.    Review of Systems has malaise, fatigue, scratchy throat     Objective:   Physical Exam Temperature 101.8 degrees blood pressure 110/80 pulse 88 pulse oximetry 97% BMI 18.37  She looks a bit pale and fatigued.  TMs are  clear.  Neck is supple.  Chest is basically clear without rales or wheezing.  Hemoglobin is 10.1 g and stable for over the past several months.  White blood cell count is 6700.  Respiratory virus panel is negative.  COVID-19 test by nasal swab is negative as well.  Chest x-ray shows extensive volume loss and scarring and fibrosis throughout much of the right lung.  Questionable small pleural effusion associated with fibrosis in the right base.  Left lung hyperexpanded but clear.       Assessment & Plan:  Acute lower respiratory infection-suspect early pneumonia  History of carcinoma of the lung  History of breast cancer  Plan: Patient given 1 g IM Rocephin in the office and placed on Levaquin 500 mg daily for 7 days.  She will keep in touch with me and call if not improving.  She has an old bottle of Tussionex that she can take sparingly for cough.  Albuterol inhaler will be called in for her.  She has had a little bit of wheezing she says.  She is to rest and drink plenty of fluids.

## 2019-12-02 NOTE — Telephone Encounter (Signed)
3:30 pm today. Wear mask. Wait in car and call us.

## 2019-12-02 NOTE — Telephone Encounter (Signed)
Scheduled

## 2019-12-02 NOTE — Telephone Encounter (Signed)
Patient called has had a cough, fever, SOB, fever and chills since Friday she had a funeral for her husband on the 19th, she has had both of her shots in March and she would like to have a consultation with you. Please advise.

## 2019-12-03 LAB — RESPIRATORY VIRUS PANEL

## 2019-12-03 LAB — SARS-COV-2 RNA,(COVID-19) QUALITATIVE NAAT: SARS CoV2 RNA: NOT DETECTED

## 2019-12-05 ENCOUNTER — Telehealth: Payer: Self-pay | Admitting: Internal Medicine

## 2019-12-05 MED ORDER — HYDROCOD POLST-CPM POLST ER 10-8 MG/5ML PO SUER: 5.0000 mL | Freq: Two times a day (BID) | ORAL | 0 refills | Status: DC | PRN

## 2019-12-05 NOTE — Telephone Encounter (Signed)
My Chart message received requesting Rx for Tussionex. It was e-scribed today. Respiratory virus panel and Covid-19 testing negative.

## 2019-12-08 NOTE — Patient Instructions (Signed)
1 g IM Rocephin given in office.  Start Levaquin 500 mg daily for 10 days.  Ventolin inhaler called ENT sprays 4 times daily as needed for wheezing.  May take Tussionex from leftover bottle at home.  Call if need anything at all or not responding to antibiotics within 48 hours or sooner if worse.

## 2019-12-12 ENCOUNTER — Telehealth: Payer: Self-pay | Admitting: Emergency Medicine

## 2019-12-12 NOTE — Telephone Encounter (Signed)
Since she improved with levaquin, I suspect this was another bacterial infection, not MAIC. I'd like for her to be fully over this episode before we repeat the CT. Probably should wait to July as planned. If she has a recurrent episode, similar symptoms, please have her call us.

## 2019-12-12 NOTE — Telephone Encounter (Signed)
LMTCB x 1 

## 2019-12-12 NOTE — Telephone Encounter (Signed)
Spoke with the pt  She states she became ill after attending her husband's funeral 11/27/19- fever, chills, cough with green  Seen by PCP and resp virus panel including covid was neg  CXR 12/02/19 impression:  IMPRESSION: Extensive volume loss with scarring and fibrosis throughout much of the right lung, similar to prior study. Questions small pleural effusion with associated fibrosis in the right base. Left lung hyperexpanded but clear. Stable cardiac silhouette  They treated her with levaquin  She still has very minimal residual cough- mainly clear now and she is afebrile  She is supposed to be getting a CT Chest in July but she is asking if this should be moved up to June and also if RB thinks this recent infection could be [facility] related Please advise, thanks!

## 2019-12-13 NOTE — Telephone Encounter (Signed)
Spoke with pt. She is aware of RB's response. Nothing further was needed at this time.

## 2019-12-17 ENCOUNTER — Other Ambulatory Visit: Payer: Self-pay

## 2019-12-17 ENCOUNTER — Ambulatory Visit (INDEPENDENT_AMBULATORY_CARE_PROVIDER_SITE_OTHER): Payer: Medicare Other | Admitting: Internal Medicine

## 2019-12-17 VITALS — BP 110/70 | HR 78 | Temp 101.6°F | Wt 108.0 lb

## 2019-12-17 DIAGNOSIS — R05 Cough: Secondary | ICD-10-CM

## 2019-12-17 DIAGNOSIS — R509 Fever, unspecified: Secondary | ICD-10-CM | POA: Diagnosis not present

## 2019-12-17 DIAGNOSIS — Z85118 Personal history of other malignant neoplasm of bronchus and lung: Secondary | ICD-10-CM | POA: Diagnosis not present

## 2019-12-17 DIAGNOSIS — R059 Cough, unspecified: Secondary | ICD-10-CM

## 2019-12-17 LAB — POCT URINALYSIS DIPSTICK
Appearance: NEGATIVE
Bilirubin, UA: NEGATIVE
Blood, UA: NEGATIVE
Glucose, UA: NEGATIVE
Ketones, UA: NEGATIVE
Leukocytes, UA: NEGATIVE
Nitrite, UA: NEGATIVE
Odor: NEGATIVE
Protein, UA: NEGATIVE
Spec Grav, UA: 1.01 (ref 1.010–1.025)
Urobilinogen, UA: 0.2 E.U./dL
pH, UA: 6.5 (ref 5.0–8.0)

## 2019-12-17 MED ORDER — CEFTRIAXONE SODIUM 1 G IJ SOLR
1.0000 g | Freq: Once | INTRAMUSCULAR | Status: AC
  Administered 2019-12-17: 1 g via INTRAMUSCULAR

## 2019-12-17 MED ORDER — LEVOFLOXACIN 500 MG PO TABS: 500.0000 mg | ORAL_TABLET | Freq: Every day | ORAL | 0 refills | Status: DC

## 2019-12-17 NOTE — Progress Notes (Signed)
   Subjective:    Patient ID: Anna Valentine, female    DOB: August 24, 1950, 69 y.o.   MRN: [id number]  HPI 69 year old Female seen May 24 for acute respiratory infection that began after funeral for her husband May 19th.  She had houseguests at the time.  She was treated with 1 g IM Rocephin, Levaquin for 10 days, Tussionex and an albuterol inhaler.  She has remote history of lung cancer and is followed by Dr. Jana Hakim and Dr. Lamonte Sakai.  She received second COVID-19 vaccine September 18, 2019.    She also has history of triple positive right breast cancer and history of homozygous p53 mutation.  History of estrogen receptor positive left-sided breast cancer 2011.  She had super D chest CT December 2021 showing interval development of multiple areas of ill-defined tree-in-bud nodularity in left lower lobe suggesting atypical infection, a new 6 mm nodule in the lingula and slight interval increase in size of precarinal lymphadenopathy.  Discussion was held regarding possible treatment for MAC infection but it was thought that this treatment could result in altered taste and further weight loss.  History of non-small cell lung cancer stage Ia 2006 status post right upper lobe wedge resection and middle lobe wedge resection, left lower lobectomy and mediastinal lymph node dissection.  Pathology showed 1.2 cm poorly differentiated adenocarcinoma.  Patient notes that she is some better but not completely better.  Still has malaise and fatigue.  Chest x-ray on May 24 showed scarring and fibrosis throughout much of the right lung similar to prior study.  Questionable small pleural effusion associated with fibrosis in the right base.  Left lung hyperexpanded but clear.  I suspect that she had pneumonia but we did not see it on chest x-ray.    Review of Systems still has malaise and fatigue.  Has had some persistent low-grade fever of 99.5 with chills and myalgias.  Feels that the Levaquin did help some.       Objective:   Physical Exam Blood pressure 110/70 pulse 78 temperature 101.6 degrees  Pulse oximetry 97% weight 108 pounds  Skin warm and dry.  No cervical adenopathy.  Chest clear without rales or wheezing.       Assessment & Plan:  Persistent fever and chills despite being on Levaquin  Plan: Feel that we need CT of the chest as soon as possible for further evaluation.  She will likely need to see Dr. Lamonte Sakai.  Her hemoglobin is decreased to 9.6 g but her white blood cell count is normal at 6700.  On May 24 hemoglobin was 10.1 g.  She is chronically anemic.  Albumin slightly low at 3.5 but liver functions are normal.  Dipstick UA is normal.  She has completed 10-day course of Levaquin that was prescribed on May 24.  This will be refilled.

## 2019-12-18 ENCOUNTER — Ambulatory Visit (HOSPITAL_COMMUNITY)
Admission: RE | Admit: 2019-12-18 | Discharge: 2019-12-18 | Disposition: A | Discharge status: Home / Self Care | Payer: Medicare Other | Source: Ambulatory Visit | Attending: Internal Medicine | Admitting: Internal Medicine

## 2019-12-18 DIAGNOSIS — R05 Cough: Secondary | ICD-10-CM | POA: Insufficient documentation

## 2019-12-18 DIAGNOSIS — Z85118 Personal history of other malignant neoplasm of bronchus and lung: Secondary | ICD-10-CM | POA: Diagnosis not present

## 2019-12-18 DIAGNOSIS — J181 Lobar pneumonia, unspecified organism: Secondary | ICD-10-CM | POA: Diagnosis not present

## 2019-12-18 DIAGNOSIS — R509 Fever, unspecified: Secondary | ICD-10-CM | POA: Diagnosis not present

## 2019-12-18 DIAGNOSIS — R059 Cough, unspecified: Secondary | ICD-10-CM

## 2019-12-18 LAB — COMPLETE METABOLIC PANEL WITH GFR
AG Ratio: 1.1 (calc) (ref 1.0–2.5)
ALT: 6 U/L (ref 6–29)
AST: 12 U/L (ref 10–35)
Albumin: 3.5 g/dL — ABNORMAL LOW (ref 3.6–5.1)
Alkaline phosphatase (APISO): 54 U/L (ref 37–153)
BUN: 14 mg/dL (ref 7–25)
CO2: 25 mmol/L (ref 20–32)
Calcium: 8.8 mg/dL (ref 8.6–10.4)
Chloride: 100 mmol/L (ref 98–110)
Creat: 0.69 mg/dL (ref 0.50–0.99)
GFR, Est African American: 104 mL/min/{1.73_m2} (ref >=60)
GFR, Est Non African American: 89 mL/min/{1.73_m2} (ref >=60)
Globulin: 3.2 g/dL (calc) (ref 1.9–3.7)
Glucose, Bld: 90 mg/dL (ref 65–99)
Potassium: 4.3 mmol/L (ref 3.5–5.3)
Sodium: 135 mmol/L (ref 135–146)
Total Bilirubin: 0.4 mg/dL (ref 0.2–1.2)
Total Protein: 6.7 g/dL (ref 6.1–8.1)

## 2019-12-18 LAB — CBC WITH DIFFERENTIAL/PLATELET
Absolute Monocytes: 737 cells/uL (ref 200–950)
Basophils Absolute: 20 cells/uL (ref 0–200)
Basophils Relative: 0.3 %
Eosinophils Absolute: 47 cells/uL (ref 15–500)
Eosinophils Relative: 0.7 %
HCT: 29.8 % — ABNORMAL LOW (ref 35.0–45.0)
Hemoglobin: 9.6 g/dL — ABNORMAL LOW (ref 11.7–15.5)
Lymphs Abs: 898 cells/uL (ref 850–3900)
MCH: 28.5 pg (ref 27.0–33.0)
MCHC: 32.2 g/dL (ref 32.0–36.0)
MCV: 88.4 fL (ref 80.0–100.0)
MPV: 9.4 fL (ref 7.5–12.5)
Monocytes Relative: 11 %
Neutro Abs: 4998 cells/uL (ref 1500–7800)
Neutrophils Relative %: 74.6 %
Platelets: 425 10*3/uL — ABNORMAL HIGH (ref 140–400)
RBC: 3.37 10*6/uL — ABNORMAL LOW (ref 3.80–5.10)
RDW: 12.6 % (ref 11.0–15.0)
Total Lymphocyte: 13.4 %
WBC: 6.7 10*3/uL (ref 3.8–10.8)

## 2019-12-18 MED ORDER — IOHEXOL 300 MG/ML  SOLN
75.0000 mL | Freq: Once | INTRAMUSCULAR | Status: AC | PRN
  Administered 2019-12-18: 75 mL via INTRAVENOUS

## 2019-12-18 MED ORDER — SODIUM CHLORIDE (PF) 0.9 % IJ SOLN
INTRAMUSCULAR | Status: AC
  Filled 2019-12-18: qty 50

## 2019-12-19 DIAGNOSIS — J851 Abscess of lung with pneumonia: Secondary | ICD-10-CM

## 2019-12-19 NOTE — Telephone Encounter (Signed)
I discussed CT results with the pt.   Recommended that she finish the repeat levaquin course. Told her that I would strongly consider doing FOB now. She would like to defer if at all possible.   We will plan to repeat her CT chest with contrast, dx lung abscess, in 6 weeks to compare >> please order She needs an OV with RB ASAP to discuss further. We may decide to proceed with FOB before the repeat Ct gets done.

## 2019-12-20 ENCOUNTER — Ambulatory Visit (INDEPENDENT_AMBULATORY_CARE_PROVIDER_SITE_OTHER): Payer: Medicare Other | Admitting: Emergency Medicine

## 2019-12-20 ENCOUNTER — Encounter: Payer: Self-pay | Admitting: Emergency Medicine

## 2019-12-20 ENCOUNTER — Other Ambulatory Visit: Payer: Self-pay

## 2019-12-20 DIAGNOSIS — R9389 Abnormal findings on diagnostic imaging of other specified body structures: Secondary | ICD-10-CM

## 2019-12-20 DIAGNOSIS — R911 Solitary pulmonary nodule: Secondary | ICD-10-CM

## 2019-12-20 MED ORDER — LEVOFLOXACIN 500 MG PO TABS: 500.0000 mg | ORAL_TABLET | Freq: Every day | ORAL | 0 refills | Status: AC

## 2019-12-20 NOTE — Addendum Note (Signed)
Addended by: Gavin Potters R on: 12/20/2019 05:00 PM   Modules accepted: Orders

## 2019-12-20 NOTE — Progress Notes (Signed)
Subjective:    Patient ID: Anna Valentine, female    DOB: Nov 18, 1950, 69 y.o.   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 Pseudomonas pansensitive  ROV 12/20/19 --Anna Valentine is 5 and has a history of right sided non-small cell lung cancer status post resection.  Also with bronchiectasis, known Mycobacterium avium, colonized with Pseudomonas, based on bronchoscopy 1 year ago (complicated by pneumothorax).  She had been clinically stable with only waxing and waning nodular disease by CT so we deferred treatment of the [facility].  Since last visit however she has been treated multiple times for exacerbations/pneumonias including in mid May and now again at the beginning of this month.  Is on levofloxacin now.  Based on the recurrent symptoms her CT chest was repeated 12/18/2019 and I have reviewed.  This shows enlargement with thick-walled cavitary lesion in the posterior right upper lobe compared with priors, a new dense area of consolidation extending from the right middle lobe bronchus and right hilum into the lateral segment of the right middle lobe, increased clustered nodularity throughout the right lung.  She restarted the levaquin on , reports that she is a bit better >. No fevers , less cough. She still has R back pain, fatigue.   MDM:  Anna Valentine notes 12/17/19 reviewed CT's reviewed as above  Review of Systems  Constitutional: Positive for activity change and fatigue. Negative for fever and unexpected weight change.  HENT: Negative.  Negative for congestion, dental problem, ear pain, nosebleeds, postnasal drip, rhinorrhea, sinus pressure, sneezing, sore throat and trouble swallowing.   Eyes: Negative.  Negative for redness and itching.  Respiratory: Negative.  Negative for cough, chest tightness, shortness of breath and wheezing.   Cardiovascular: Negative.  Negative for palpitations and leg swelling.  Gastrointestinal:  Negative.  Negative for nausea and vomiting.  Genitourinary: Negative.  Negative for dysuria.  Musculoskeletal: Positive for back pain. Negative for joint swelling.  Skin: Negative.  Negative for rash.  Neurological: Negative.  Negative for headaches.  Hematological: Negative.  Does not bruise/bleed easily.  Psychiatric/Behavioral: Negative.  Negative for dysphoric mood. The patient is not nervous/anxious.         Objective:   Physical Exam Vitals:   12/20/19 1409  BP: 112/64  Pulse: 80  Temp: 97.8 F (36.6 C)  TempSrc: Oral  SpO2: 97%  Weight: 106 lb (48.1 kg)  Height: 5\' 6"  (1.676 m)   Gen: Pleasant, thin woman, well-nourished, in no distress,  normal affect  ENT: No lesions,  mouth clear,  oropharynx clear, no postnasal drip  Neck: No JVD, no stridor  Lungs: No use of accessory muscles, clear without rales or rhonchi  Cardiovascular: RRR, heart sounds normal, no murmur or gallops, no peripheral edema  Musculoskeletal: No deformities, no cyanosis or clubbing  Neuro: alert, non focal  Skin: Warm, no lesions or rash     Assessment & Plan:  Abnormal CT of the chest Difficult situation now that she has become symptomatic and her CT scan has evolved.  Differential diagnosis includes bacterial pneumonia (which we are now treating), progressive MAIC for which she has never been treated, or most concerning recurrence of her non-small cell lung cancer.  I have spoken to her about possible bronchoscopy.  We will plan to extend her Levaquin course, obtain a PET scan to look for interval resolution in her right lung infiltrates, areas of hypermetabolism that would potentially be targets for bronchoscopic intervention.  Based on  the results we will decide whether to perform bronchoscopy or not.  We will ultimately have to decide whether to initiate long-term [facility] treatment.  I think that given the interval change in her imaging we will be compelled to do so.  We will order a PET  scan to evaluate for interval change in your lung infiltrates We will extend your Levaquin prescription for 10 more days.  Please take 500 mg daily until both prescriptions completely gone Keep albuterol available to use 2 puffs if needed for shortness of breath, chest tightness, wheezing. Follow with Anna Valentine next available after PET scan so that we can review together  Anna Apo, MD, PhD 12/20/2019, 2:38 PM [facility] [phone] or if no answer [phone]

## 2019-12-20 NOTE — Assessment & Plan Note (Addendum)
Difficult situation now that she has become symptomatic and her CT scan has evolved.  Differential diagnosis includes bacterial pneumonia (which we are now treating), progressive MAIC for which she has never been treated, or most concerning recurrence of her non-small cell lung cancer.  I have spoken to her about possible bronchoscopy.  We will plan to extend her Levaquin course, obtain a PET scan to look for interval resolution in her right lung infiltrates, areas of hypermetabolism that would potentially be targets for bronchoscopic intervention.  Based on the results we will decide whether to perform bronchoscopy or not.  We will ultimately have to decide whether to initiate long-term [facility] treatment.  I think that given the interval change in her imaging we will be compelled to do so.  We will order a PET scan to evaluate for interval change in your lung infiltrates We will extend your Levaquin prescription for 10 more days.  Please take 500 mg daily until both prescriptions completely gone Keep albuterol available to use 2 puffs if needed for shortness of breath, chest tightness, wheezing. Follow with Dr. Lamonte Sakai next available after PET scan so that we can review together

## 2019-12-20 NOTE — Patient Instructions (Addendum)
We will order a PET scan to evaluate for interval change in your lung infiltrates We will extend your Levaquin prescription for 10 more days.  Please take 500 mg daily until both prescriptions completely gone Keep albuterol available to use 2 puffs if needed for shortness of breath, chest tightness, wheezing. Follow with Dr. Lamonte Sakai next available after PET scan so that we can review together

## 2019-12-20 NOTE — Telephone Encounter (Signed)
Called and spoke with pt to see if she could come in this afternoon to see RB as he had openings at 2pm and 2:15. Pt stated she could come in to see him at 2pm. Pt has been added to RB's schedule at 2pm. Nothing further needed.

## 2019-12-21 ENCOUNTER — Encounter: Payer: Self-pay | Admitting: Internal Medicine

## 2019-12-21 NOTE — Patient Instructions (Signed)
Refill Levaquin.  Order CT of the chest with contrast.

## 2019-12-25 MED ORDER — AMOXICILLIN-POT CLAVULANATE 875-125 MG PO TABS: 1.0000 | ORAL_TABLET | Freq: Two times a day (BID) | ORAL | 0 refills | Status: DC

## 2019-12-25 NOTE — Telephone Encounter (Signed)
Please let her know that based on her scan of the chest, the appearance on film I wonder whether she might get more benefit from Augmentin then from Levaquin.  She has had an extended course of Levaquin and I think it would be okay to stop it, replace it with Augmentin 875 mg twice daily for 10 days.  She probably needs to be seen sooner than our initial plan, which was to hold off on FOB, repeat her CT scan of the chest in 6 weeks, follow 7/14.  Please set her up with APP sooner than that if at all possible to ensure that she is stable to complete this plan.

## 2019-12-25 NOTE — Telephone Encounter (Signed)
Dr. Lamonte Sakai, please see all mychart feeds and advise on it.

## 2019-12-25 NOTE — Telephone Encounter (Signed)
Received the following message from patient:   "FYI: I have been religious about taking the Levofloxacin. Lower back pain has been gone since Monday. Chills started again last night and had a temperature of 100.  Still 100 this morning. Wanted to keep you informed of my progress---or lack thereof."  I asked the patient if she had noticed a difference in her SOB. Will await her response before sending message to [place].

## 2019-12-31 ENCOUNTER — Other Ambulatory Visit: Payer: Self-pay

## 2019-12-31 ENCOUNTER — Ambulatory Visit (HOSPITAL_COMMUNITY)
Admission: RE | Admit: 2019-12-31 | Discharge: 2019-12-31 | Disposition: A | Discharge status: Home / Self Care | Payer: Medicare Other | Source: Ambulatory Visit | Attending: Emergency Medicine | Admitting: Emergency Medicine

## 2019-12-31 DIAGNOSIS — J479 Bronchiectasis, uncomplicated: Secondary | ICD-10-CM | POA: Diagnosis not present

## 2019-12-31 DIAGNOSIS — R918 Other nonspecific abnormal finding of lung field: Secondary | ICD-10-CM | POA: Diagnosis not present

## 2019-12-31 DIAGNOSIS — R9389 Abnormal findings on diagnostic imaging of other specified body structures: Secondary | ICD-10-CM

## 2019-12-31 DIAGNOSIS — R911 Solitary pulmonary nodule: Secondary | ICD-10-CM

## 2019-12-31 LAB — GLUCOSE, CAPILLARY: Glucose-Capillary: 90 mg/dL (ref 70–99)

## 2019-12-31 MED ORDER — FLUDEOXYGLUCOSE F - 18 (FDG) INJECTION
5.0000 | Freq: Once | INTRAVENOUS | Status: AC
  Administered 2019-12-31: 5 via INTRAVENOUS

## 2020-01-03 ENCOUNTER — Ambulatory Visit (INDEPENDENT_AMBULATORY_CARE_PROVIDER_SITE_OTHER): Payer: Medicare Other | Admitting: Emergency Medicine

## 2020-01-03 ENCOUNTER — Telehealth: Payer: Self-pay | Admitting: *Deleted

## 2020-01-03 ENCOUNTER — Other Ambulatory Visit: Payer: Self-pay

## 2020-01-03 ENCOUNTER — Other Ambulatory Visit: Payer: Medicare Other

## 2020-01-03 ENCOUNTER — Telehealth: Payer: Self-pay | Admitting: Emergency Medicine

## 2020-01-03 VITALS — BP 108/68 | HR 82 | Temp 98.3°F | Ht 66.0 in | Wt 106.6 lb

## 2020-01-03 DIAGNOSIS — Z5181 Encounter for therapeutic drug level monitoring: Secondary | ICD-10-CM

## 2020-01-03 DIAGNOSIS — R911 Solitary pulmonary nodule: Secondary | ICD-10-CM

## 2020-01-03 DIAGNOSIS — R9389 Abnormal findings on diagnostic imaging of other specified body structures: Secondary | ICD-10-CM

## 2020-01-03 LAB — PROTIME-INR
INR: 1.1 ratio — ABNORMAL HIGH (ref 0.8–1.0)
Prothrombin Time: 12.3 s (ref 9.6–13.1)

## 2020-01-03 LAB — APTT: aPTT: 27.5 s (ref 23.4–32.7)

## 2020-01-03 MED ORDER — AMOXICILLIN-POT CLAVULANATE 875-125 MG PO TABS: 1.0000 | ORAL_TABLET | Freq: Two times a day (BID) | ORAL | 0 refills | Status: DC

## 2020-01-03 NOTE — Telephone Encounter (Signed)
Orders placed for pre procedure ENB

## 2020-01-03 NOTE — Progress Notes (Signed)
Subjective:    Patient ID: Anna Valentine, female    DOB: [birth year], 69 y.o.   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 Pseudomonas pansensitive  ROV 12/20/19 --Anna Valentine is 60 and has a history of right sided non-small cell lung cancer status post resection.  Also with bronchiectasis, known Mycobacterium avium, colonized with Pseudomonas, based on bronchoscopy 1 year ago (complicated by pneumothorax).  She had been clinically stable with only waxing and waning nodular disease by CT so we deferred treatment of the [facility].  Since last visit however she has been treated multiple times for exacerbations/pneumonias including in mid May and now again at the beginning of this month.  Is on levofloxacin now.  Based on the recurrent symptoms her CT chest was repeated 12/18/2019 and I have reviewed.  This shows enlargement with thick-walled cavitary lesion in the posterior right upper lobe compared with priors, a new dense area of consolidation extending from the right middle lobe bronchus and right hilum into the lateral segment of the right middle lobe, increased clustered nodularity throughout the right lung.  She restarted the levaquin on , reports that she is a bit better >. No fevers , less cough. She still has R back pain, fatigue.   ROV 01/03/20 --follow-up visit for Anna Valentine, 69 year old woman with a history of right-sided non-small cell lung cancer post resection, bronchiectasis with known Mycobacterium avium and Pseudomonas.  She has been sick for over a month with symptoms consistent with pneumonia, treated with levofloxacin.  There is an enlarging area of thick-walled cavitation and dense consolidation on CT scan, difficult to interpret, could be consistent with infection/abscess but certainly concerning for malignancy. Her fevers, chills, cough have not resolved. The mucous production is less. She is completing Augmentin now.   PET  scan was done 12/31/2019 and I have reviewed.  This shows intense hypermetabolism in the cavitary lesion as well as the right lung parenchyma.  Is also a focus of hypermetabolic peripheral pleural thickening and some scattered other nodule hypermetabolic areas.    Review of Systems  Constitutional: Positive for activity change and fatigue. Negative for fever and unexpected weight change.  HENT: Negative.  Negative for congestion, dental problem, ear pain, nosebleeds, postnasal drip, rhinorrhea, sinus pressure, sneezing, sore throat and trouble swallowing.   Eyes: Negative.  Negative for redness and itching.  Respiratory: Negative.  Negative for cough, chest tightness, shortness of breath and wheezing.   Cardiovascular: Negative.  Negative for palpitations and leg swelling.  Gastrointestinal: Negative.  Negative for nausea and vomiting.  Genitourinary: Negative.  Negative for dysuria.  Musculoskeletal: Positive for back pain. Negative for joint swelling.  Skin: Negative.  Negative for rash.  Neurological: Negative.  Negative for headaches.  Hematological: Negative.  Does not bruise/bleed easily.  Psychiatric/Behavioral: Negative.  Negative for dysphoric mood. The patient is not nervous/anxious.         Objective:   Physical Exam Vitals:   01/03/20 1100  BP: 108/68  Pulse: 82  Temp: 98.3 F (36.8 C)  TempSrc: Oral  SpO2: 99%  Weight: 106 lb 9.6 oz (48.4 kg)  Height: 5\' 6"  (1.676 m)   Gen: Pleasant, thin woman, well-nourished, in no distress,  normal affect  ENT: No lesions,  mouth clear,  oropharynx clear, no postnasal drip  Neck: No JVD, no stridor  Lungs: No use of accessory muscles, coarse R lung  Cardiovascular: RRR, heart sounds normal, no murmur or gallops, no peripheral  edema  Musculoskeletal: No deformities, no cyanosis or clubbing  Neuro: alert, non focal  Skin: Warm, no lesions or rash     Assessment & Plan:  Abnormal CT of the chest Unclear whether this is  malignancy recurrence vs untreated / partially-treated infection. She does have hx MAIC, pseudomonas.   We will arrange for bronchoscopy and biopsies.  Goal is to get this done next week as an outpatient.  We will call you with the scheduling information. Please continue Augmentin as you have been taking it until we are able to perform your procedure Follow with Dr Lamonte Sakai in 1 month  Baltazar Apo, MD, PhD 01/08/2020, 1:20 PM [facility] [phone] or if no answer [phone]

## 2020-01-03 NOTE — Patient Instructions (Signed)
We will arrange for bronchoscopy and biopsies.  Goal is to get this done next week as an outpatient.  We will call you with the scheduling information. Please continue Augmentin as you have been taking it until we are able to perform your procedure Follow with Dr Lamonte Sakai in 1 month

## 2020-01-03 NOTE — H&P (View-Only) (Signed)
Subjective:    Patient ID: Anna Valentine, female    DOB: Aug 11, 1950, 69 y.o.   MRN: [id number]  HPI  Mycobacterium avium sensitive to clarithromycin, amikacin, moxifloxacin, linezolid. Pseudomonas pansensitive  ROV 12/20/19 --Anna Valentine is 80 and has a history of right sided non-small cell lung cancer status post resection.  Also with bronchiectasis, known Mycobacterium avium, colonized with Pseudomonas, based on bronchoscopy 1 year ago (complicated by pneumothorax).  She had been clinically stable with only waxing and waning nodular disease by CT so we deferred treatment of the [facility].  Since last visit however she has been treated multiple times for exacerbations/pneumonias including in mid May and now again at the beginning of this month.  Is on levofloxacin now.  Based on the recurrent symptoms her CT chest was repeated 12/18/2019 and I have reviewed.  This shows enlargement with thick-walled cavitary lesion in the posterior right upper lobe compared with priors, a new dense area of consolidation extending from the right middle lobe bronchus and right hilum into the lateral segment of the right middle lobe, increased clustered nodularity throughout the right lung.  She restarted the levaquin on , reports that she is a bit better >. No fevers , less cough. She still has R back pain, fatigue.   ROV 01/03/20 --follow-up visit for [name], 69 year old woman with a history of right-sided non-small cell lung cancer post resection, bronchiectasis with known Mycobacterium avium and Pseudomonas.  She has been sick for over a month with symptoms consistent with pneumonia, treated with levofloxacin.  There is an enlarging area of thick-walled cavitation and dense consolidation on CT scan, difficult to interpret, could be consistent with infection/abscess but certainly concerning for malignancy. Her fevers, chills, cough have not resolved. The mucous production is less. She is completing Augmentin now.   PET  scan was done 12/31/2019 and I have reviewed.  This shows intense hypermetabolism in the cavitary lesion as well as the right lung parenchyma.  Is also a focus of hypermetabolic peripheral pleural thickening and some scattered other nodule hypermetabolic areas.    Review of Systems  Constitutional: Positive for activity change and fatigue. Negative for fever and unexpected weight change.  HENT: Negative.  Negative for congestion, dental problem, ear pain, nosebleeds, postnasal drip, rhinorrhea, sinus pressure, sneezing, sore throat and trouble swallowing.   Eyes: Negative.  Negative for redness and itching.  Respiratory: Negative.  Negative for cough, chest tightness, shortness of breath and wheezing.   Cardiovascular: Negative.  Negative for palpitations and leg swelling.  Gastrointestinal: Negative.  Negative for nausea and vomiting.  Genitourinary: Negative.  Negative for dysuria.  Musculoskeletal: Positive for back pain. Negative for joint swelling.  Skin: Negative.  Negative for rash.  Neurological: Negative.  Negative for headaches.  Hematological: Negative.  Does not bruise/bleed easily.  Psychiatric/Behavioral: Negative.  Negative for dysphoric mood. The patient is not nervous/anxious.         Objective:   Physical Exam Vitals:   01/03/20 1100  BP: 108/68  Pulse: 82  Temp: 98.3 F (36.8 C)  TempSrc: Oral  SpO2: 99%  Weight: 106 lb 9.6 oz (48.4 kg)  Height: 5\' 6"  (1.676 m)   Gen: Pleasant, thin woman, well-nourished, in no distress,  normal affect  ENT: No lesions,  mouth clear,  oropharynx clear, no postnasal drip  Neck: No JVD, no stridor  Lungs: No use of accessory muscles, coarse R lung  Cardiovascular: RRR, heart sounds normal, no murmur or gallops, no peripheral  edema  Musculoskeletal: No deformities, no cyanosis or clubbing  Neuro: alert, non focal  Skin: Warm, no lesions or rash     Assessment & Plan:  Abnormal CT of the chest Unclear whether this is  malignancy recurrence vs untreated / partially-treated infection. She does have hx MAIC, pseudomonas.   We will arrange for bronchoscopy and biopsies.  Goal is to get this done next week as an outpatient.  We will call you with the scheduling information. Please continue Augmentin as you have been taking it until we are able to perform your procedure Follow with Dr Lamonte Sakai in 1 month  Baltazar Apo, MD, PhD 01/08/2020, 1:20 PM [facility] [phone] or if no answer [phone]

## 2020-01-03 NOTE — Telephone Encounter (Signed)
Pt has been scheduled for ENB on 6/30 at 3:15 in main OR at [facility].  I have spoken to pt & she has been told to arrive at 12:45.  She is to go for labs today at [facility] and covid test scheduled for 6/28 at 10:15.  I have given appt info to pt and sent message to RB/Lauren.

## 2020-01-06 ENCOUNTER — Other Ambulatory Visit (HOSPITAL_COMMUNITY): Payer: Medicare Other

## 2020-01-06 ENCOUNTER — Other Ambulatory Visit (HOSPITAL_COMMUNITY)
Admission: RE | Admit: 2020-01-06 | Discharge: 2020-01-06 | Disposition: A | Discharge status: Home / Self Care | Payer: Medicare Other | Source: Ambulatory Visit | Attending: Emergency Medicine | Admitting: Emergency Medicine

## 2020-01-06 DIAGNOSIS — Z01812 Encounter for preprocedural laboratory examination: Secondary | ICD-10-CM | POA: Insufficient documentation

## 2020-01-06 DIAGNOSIS — Z20822 Contact with and (suspected) exposure to covid-19: Secondary | ICD-10-CM | POA: Insufficient documentation

## 2020-01-06 LAB — SARS CORONAVIRUS 2 (TAT 6-24 HRS): SARS Coronavirus 2: NEGATIVE

## 2020-01-07 ENCOUNTER — Telehealth: Payer: Self-pay | Admitting: Emergency Medicine

## 2020-01-07 ENCOUNTER — Other Ambulatory Visit: Payer: Self-pay

## 2020-01-07 ENCOUNTER — Encounter (HOSPITAL_COMMUNITY): Payer: Self-pay | Admitting: Emergency Medicine

## 2020-01-07 NOTE — Telephone Encounter (Signed)
Patient given number to Anna Valentine for questions regarding her procedure tomorrow. She did not have a pre visit and wanted some information regarding her scheduled procedure.

## 2020-01-07 NOTE — Progress Notes (Signed)
Anesthesia Chart Review: Anna Valentine   Case: [id number] Date/Time: 01/08/20 1515   Procedure: VIDEO BRONCHOSCOPY WITH ENDOBRONCHIAL NAVIGATION (N/A )   Anesthesia type: General   Pre-op diagnosis: ABNORMAL CT OF CHEST; RIGHT LUNG NODULES   Location: [facility] ROOM 09 / [facility]   Surgeons: Collene Gobble, MD      DISCUSSION: Patient is a 69 year old female scheduled for the above procedure.  History includes former smoker (quit 07/12/87), lung cancer (adenocarcinoma RLL and necrotizing granulomatous disease RUL/RML, s/p RUL wedge resection, RML wedge resection, RL lobectomy and mediastinal lymph node dissection 06/06/05), breast cancer (s/p left breast lumpectomy 10/12/09, XRT; s/p left risk reducing mastectomy, right mastectomy, right IJ Port 03/15/18, chemotherapy), anemia, HTN, CHF, COPD, GERD.  She underwent restaging PET Scan in 01/2019 which showed hypermetabolic pulmonary nodules on the right, s/p ENB 3/34/35 complicated by s/p left PTX. Cultures positive for Pseudomonas, MAIC, suspected colonization but felt "certainly at risk for progression, bronchitis/pneumonia" with serial imaging recommended. June 2021 imaging showed progressive findings, and she was becoming more symptomatic. Dr. Lamonte Sakai wrote, "Differential diagnosis includes bacterial pneumonia (which we are now treating), progressive MAIC for which she has never been treated, or most concerning recurrence of her non-small cell lung cancer.  I have spoken to her about possible bronchoscopy." Levaquin was changed to Augmentin.   She is followed by Dr. Aundra Dubin at the [facility] for cardio-oncology monitoring given use of Herceptin. Also known coronary calcifications on prior CT (low risk stress test 2019). 09/2018 echo showed "significantly less negative strain, and Herceptin was held. There is no plan to restart Herceptin." Follow-up echo showed EF 50-55% (11/08/18), 45-50% (12/28/18), 55% (03/08/19). Continue Coreg and losartan. No chest pain. Still  easily fatigues and gets SOB with heavy exertion. PASP 48 mmHg on prior echo, he suspected group 3 pulmonary hypertension from lung disease. Overall, felt clinically stable. One year follow-up with echo planned.   01/06/2020 presurgical COVID-19 test negative.  She is a same-day work-up, so anesthesia team to evaluate on the day of surgery.   VS:  BP Readings from Last 3 Encounters:  01/03/20 108/68  12/20/19 112/64  12/17/19 110/70   Pulse Readings from Last 3 Encounters:  01/03/20 82  12/20/19 80  12/17/19 78    PROVIDERS: [name], MD his PCP Loralie Champagne, MD is cardiologist. Last visit 03/08/19. Magrinat, Sarajane Jews, MD is HEM-ONC. Last visit 07/16/19 with Wilber Bihari, NP. She had been seeing [name], MD at [facility] for lung cancer.  [name], MD is RAD-ONC   LABS: Most recent lab results include: Lab Results  Component Value Date   WBC 6.7 12/17/2019   HGB 9.6 (L) 12/17/2019   HCT 29.8 (L) 12/17/2019   PLT 425 (H) 12/17/2019   GLUCOSE 90 12/17/2019   ALT 6 12/17/2019   AST 12 12/17/2019   NA 135 12/17/2019   K 4.3 12/17/2019   CL 100 12/17/2019   CREATININE 0.69 12/17/2019   BUN 14 12/17/2019   CO2 25 12/17/2019   INR 1.1 (H) 01/03/2020     IMAGES: PET Scan 12/31/19: IMPRESSION: 1. Intense metabolic activity associated with the RIGHT upper lobe consolidation is highly concerning for lung cancer recurrence. Pulmonary infection could have a similar pattern but less favored. Recommend clinical correlation. 2. Additional pleural nodules and smaller foci of consolidation in the lower RIGHT lung also with intense metabolic activity. 3. Cavitary lesion in the superior aspect of the RIGHT lung with moderate metabolic activity again  concerning for lung cancer recurrence. Pulmonary infection such as fungal infection would also be considered. 4. No hypermetabolic mediastinal lymph nodes. 5. Focus of bronchiectasis and nodularity in the lingula  with differential include infection versus metastatic disease.   CT Chest 12/18/19: IMPRESSION: 1. Redemonstrated postoperative findings of right lower lobectomy with volume loss of the right hemithorax and rightward shift of the mediastinal contents. 2. There is a new, dense, masslike consolidation about the right hilum, right middle lobe bronchus, and lateral segment right middle lobe, difficult to accurately measure although at least 5.8 x 4.3 cm. This appearance is concerning for recurrent malignancy, however infectious consolidation is difficult to distinguish given the overall constellation of findings. 3. Interval enlargement of a thick-walled cavitary lesion of the posterior right upper lobe. 4. There is extensive, clustered nodularity throughout the right lung, which is generally worsened, particularly in the right lower lobe. There are additional areas of clustered nodularity and small cavitary opacities in the lingula, which are minimally worsened compared to prior examination. These findings are consistent with worsened atypical infection, particularly atypical Mycobacterium. 5. PET-CT may be helpful to assess for metabolic activity and to distinguish recurrent malignancy from atypical infection. 6. Aortic Atherosclerosis (ICD10-I70.0).  Coronary artery disease. 7. Hepatic steatosis.   EKG: 02/06/19: Normal sinus rhythm Left ventricular hypertrophy Abnormal ekg Non-specific ST-t changes NO LONGER PRESENT Confirmed by Glori Bickers [phone]) on 02/07/2019 5:32:33 PM   CV: Echo 03/08/19: IMPRESSIONS  1. The left ventricle has a visually estimated ejection fraction of 55%.  The cavity size was normal. Left ventricular diastolic Doppler parameters  are consistent with pseudonormalization. No evidence of left ventricular  regional wall motion  abnormalities. GLS -16.1%.  2. The right ventricle has normal systolic function. The cavity was  normal. There is no  increase in right ventricular wall thickness.  3. There is mild mitral annular calcification present. No evidence of  mitral valve stenosis.  4. The aortic valve is tricuspid. Mild calcification of the aortic valve.  No stenosis of the aortic valve.  5. The aorta is normal unless otherwise noted.  6. The aortic root is normal in size and structure.  7. Normal IVC size. PA systolic pressure 32 mmHg.   Nuclear stress test 12/20/17:  Nuclear stress EF: 75%.  There was no ST segment deviation noted during stress.  This is a low risk study.  The left ventricular ejection fraction is hyperdynamic (>65%). 1. EF 75% with normal wall motion.  2. Fixed small mild apical septal/apical perfusion defect.  Given normal wall motion, suspect attenuation.  Low risk study.    Past Medical History:  Diagnosis Date  . Anemia   . BRCA negative 2011   BRCA I/ II negative  . Breast cancer ([place]) 08/2009   stage 2, rx with lumpectomy and xrt  . CHF (congestive heart failure) ([place])   . Constipation   . COPD (chronic obstructive pulmonary disease) ([facility])    pt.unsure of diagnosis status  . Emphysema of lung ([place])    pt. questions diagnosis  . Family history of adverse reaction to anesthesia    My Sister has nausea  . Family history of breast cancer   . Family history of colon cancer   . Family history of lung cancer   . GERD (gastroesophageal reflux disease)    not presently having symptoms  . Hypertension   . Lung cancer ([place]) 06/06/05   stage 1 poorly differentiated adenocarcinoma, s/p right lower lobectomy.  . Osteoporosis   .  Personal history of lung cancer   . Pneumonia    2007ish, walking pnemonia - 2009 ish  . STD (sexually transmitted disease)    HSV    Past Surgical History:  Procedure Laterality Date  . BREAST BIOPSY    . BREAST LUMPECTOMY  10/12/2009   Left lumpectomy and radiation, stage II, ER/PR+, Her 2 nu negative  . BUNIONECTOMY Bilateral   . COLONOSCOPY    .  COLONOSCOPY    . LOBECTOMY Right 06/06/2005   [place]  . MASTECTOMY W/ SENTINEL NODE BIOPSY Bilateral 03/15/2018  . MASTECTOMY W/ SENTINEL NODE BIOPSY Bilateral 03/15/2018   Procedure: BILATERAL TOTAL MASTECTOMIES WITH RIGHT SENTINEL LYMPH NODE BIOPSY;  Surgeon: Rolm Bookbinder, MD;  Location: [facility];  Service: General;  Laterality: Bilateral;  . PORTACATH PLACEMENT N/A 03/15/2018   Procedure: INSERTION PORT-A-CATH WITH [place];  Surgeon: Rolm Bookbinder, MD;  Location: [facility];  Service: General;  Laterality: N/A;  . TONSILLECTOMY    . TUBAL LIGATION  1984  . VIDEO BRONCHOSCOPY  02/06/2019   Flexible video fiberoptic bronchoscopy with electromagnetic navigation and biopsies.  Marland Kitchen VIDEO BRONCHOSCOPY WITH ENDOBRONCHIAL NAVIGATION Left 02/06/2019   Procedure: VIDEO BRONCHOSCOPY WITH ENDOBRONCHIAL NAVIGATION, left lung;  Surgeon: Collene Gobble, MD;  Location: [facility] OR;  Service: Thoracic;  Laterality: Left;    MEDICATIONS: No current facility-administered medications for this encounter.   Marland Kitchen acyclovir (ZOVIRAX) 400 MG tablet  . albuterol (VENTOLIN HFA) 108 (90 Base) MCG/ACT inhaler  . amoxicillin-clavulanate (AUGMENTIN) 875-125 MG tablet  . calcium-vitamin D (OSCAL WITH D) 500-200 MG-UNIT tablet  . carvedilol (COREG) 6.25 MG tablet  . cholecalciferol (VITAMIN D) 1000 UNITS tablet  . ibuprofen (ADVIL) 200 MG tablet  . losartan (COZAAR) 25 MG tablet  . Psyllium (METAMUCIL PO)  . tamoxifen (NOLVADEX) 20 MG tablet  . vitamin C (ASCORBIC ACID) 500 MG tablet  . levofloxacin (LEVAQUIN) 500 MG tablet   Not currently taking Levaquin.   Myra Gianotti, PA-C Surgical Short Stay/Anesthesiology [facility] Phone [phone] [facility] Phone [phone] 01/07/2020 3:48 PM

## 2020-01-07 NOTE — Anesthesia Preprocedure Evaluation (Addendum)
Anesthesia Evaluation  Patient identified by MRN, date of birth, ID band Patient awake    Reviewed: Allergy & Precautions, NPO status , Patient's Chart, lab work & pertinent test results  History of Anesthesia Complications (+) Family history of anesthesia reaction  Airway Mallampati: II  TM Distance: >3 FB Neck ROM: Full    Dental no notable dental hx. (+) Dental Advisory Given, Teeth Intact,    Pulmonary pneumonia, COPD, former smoker,    Pulmonary exam normal breath sounds clear to auscultation       Cardiovascular hypertension, Pt. on home beta blockers +CHF  Normal cardiovascular exam Rhythm:Regular Rate:Normal     Neuro/Psych PSYCHIATRIC DISORDERS Anxiety negative neurological ROS     GI/Hepatic Neg liver ROS, GERD  ,  Endo/Other  negative endocrine ROS  Renal/GU negative Renal ROS     Musculoskeletal negative musculoskeletal ROS (+)   Abdominal   Peds  Hematology  (+) Blood dyscrasia, anemia ,   Anesthesia Other Findings   Reproductive/Obstetrics negative OB ROS                                                            Anesthesia Evaluation  Patient identified by MRN, date of birth, ID band Patient awake    Reviewed: Allergy & Precautions, NPO status , Patient's Chart, lab work & pertinent test results  Airway Mallampati: II  TM Distance: >3 FB Neck ROM: Full    Dental  (+) Teeth Intact, Dental Advisory Given   Pulmonary former smoker,    breath sounds clear to auscultation       Cardiovascular hypertension,  Rhythm:Regular Rate:Normal     Neuro/Psych    GI/Hepatic   Endo/Other    Renal/GU      Musculoskeletal   Abdominal   Peds  Hematology   Anesthesia Other Findings   Reproductive/Obstetrics                            Anesthesia Physical Anesthesia Plan  ASA: III  Anesthesia Plan: General   Post-op Pain  Management:    Induction: Intravenous  PONV Risk Score and Plan: Ondansetron and Dexamethasone  Airway Management Planned: Oral ETT  Additional Equipment:   Intra-op Plan:   Post-operative Plan: Extubation in OR  Informed Consent: I have reviewed the patients History and Physical, chart, labs and discussed the procedure including the risks, benefits and alternatives for the proposed anesthesia with the patient or authorized representative who has indicated his/her understanding and acceptance.     Dental advisory given  Plan Discussed with: CRNA and Anesthesiologist  Anesthesia Plan Comments: (Follows with Dr. Aundra Dubin for cardio-oncology monitoring given use of Herceptin. She had atypical chest pain in 12/2017, Cardiolite at that time showed no ischemia.  Echo in 3/20 showed significantly less negative strain, and Herceptin was held.  She was started on Coreg.  Repeat echo in 4/20 showed EF down to 50-55%, losartan was added.  Repeat echo in 6/20 showed EF lower again at 45-50%.  She remains off Herceptin.  She has a history of bronchiectasis and also of extensive right lung resections for lung cancer (RUL and RML wedges, RLL lobectomy).  TTE 12/28/18:  1. The left ventricle has mildly reduced systolic function, with an ejection fraction  of 45-50%. The cavity size was normal. Left ventricular diastolic Doppler parameters are consistent with pseudonormalization. Left ventricular diffuse hypokinesis.  2. The right ventricle has normal systolic function. The cavity was normal. Right ventricular systolic pressure is mildly elevated with an estimated pressure of 42.3 mmHg.  3. The mitral valve is grossly normal. There is mild mitral annular calcification present.  4. The tricuspid valve is grossly normal.  5. The aortic valve is tricuspid. Mild thickening of the aortic valve. No stenosis of the aortic valve.  6. Mild global reduction in LV systolic function; moderate diastolic dysfunction;  mild TR; mildly elevated pulmonary pressure; GLS-17.7%.  Nuclear stress 12/20/17:  Nuclear stress EF: 75%.  There was no ST segment deviation noted during stress.  This is a low risk study.  The left ventricular ejection fraction is hyperdynamic (>65%).   1. EF 75% with normal wall motion.  2. Fixed small mild apical septal/apical perfusion defect.  Given normal wall motion, suspect attenuation.   Low risk study. )      Anesthesia Quick Evaluation  Anesthesia Physical Anesthesia Plan  ASA: III  Anesthesia Plan: General   Post-op Pain Management:    Induction: Intravenous  PONV Risk Score and Plan: 3 and Ondansetron, Dexamethasone and Treatment may vary due to age or medical condition  Airway Management Planned: Oral ETT  Additional Equipment: None  Intra-op Plan:   Post-operative Plan: Extubation in OR  Informed Consent: I have reviewed the patients History and Physical, chart, labs and discussed the procedure including the risks, benefits and alternatives for the proposed anesthesia with the patient or authorized representative who has indicated his/her understanding and acceptance.     Dental advisory given  Plan Discussed with: CRNA  Anesthesia Plan Comments: (PAT note written 01/07/2020 by Myra Gianotti, PA-C. SAME DAY WORK-UP   )      Anesthesia Quick Evaluation

## 2020-01-07 NOTE — Progress Notes (Signed)
Patient denies chest pain. Reports shortness of breath that she is being worked up for. Cardiologist is Dr. Aundra Dubin. Report she has been in quarantine since COVID test. Educated on visitation policy.

## 2020-01-08 ENCOUNTER — Ambulatory Visit (HOSPITAL_COMMUNITY): Payer: Medicare Other | Admitting: Vascular Surgery

## 2020-01-08 ENCOUNTER — Encounter: Payer: Self-pay | Admitting: Emergency Medicine

## 2020-01-08 ENCOUNTER — Encounter (HOSPITAL_COMMUNITY): Payer: Self-pay | Admitting: Emergency Medicine

## 2020-01-08 ENCOUNTER — Ambulatory Visit (HOSPITAL_COMMUNITY)
Admission: RE | Admit: 2020-01-08 | Discharge: 2020-01-08 | Disposition: A | Discharge status: Home / Self Care | Payer: Medicare Other | Attending: Emergency Medicine | Admitting: Emergency Medicine

## 2020-01-08 ENCOUNTER — Ambulatory Visit (HOSPITAL_COMMUNITY): Payer: Medicare Other

## 2020-01-08 ENCOUNTER — Encounter (HOSPITAL_COMMUNITY)
Admission: RE | Disposition: A | Discharge status: Home / Self Care | Payer: Self-pay | Source: Home / Self Care | Attending: Emergency Medicine

## 2020-01-08 ENCOUNTER — Other Ambulatory Visit: Payer: Self-pay

## 2020-01-08 DIAGNOSIS — J189 Pneumonia, unspecified organism: Secondary | ICD-10-CM | POA: Diagnosis not present

## 2020-01-08 DIAGNOSIS — K760 Fatty (change of) liver, not elsewhere classified: Secondary | ICD-10-CM | POA: Insufficient documentation

## 2020-01-08 DIAGNOSIS — Z902 Acquired absence of lung [part of]: Secondary | ICD-10-CM | POA: Insufficient documentation

## 2020-01-08 DIAGNOSIS — I700 Atherosclerosis of aorta: Secondary | ICD-10-CM | POA: Insufficient documentation

## 2020-01-08 DIAGNOSIS — I2510 Atherosclerotic heart disease of native coronary artery without angina pectoris: Secondary | ICD-10-CM | POA: Insufficient documentation

## 2020-01-08 DIAGNOSIS — K219 Gastro-esophageal reflux disease without esophagitis: Secondary | ICD-10-CM | POA: Insufficient documentation

## 2020-01-08 DIAGNOSIS — Z85118 Personal history of other malignant neoplasm of bronchus and lung: Secondary | ICD-10-CM | POA: Diagnosis not present

## 2020-01-08 DIAGNOSIS — Z9221 Personal history of antineoplastic chemotherapy: Secondary | ICD-10-CM | POA: Insufficient documentation

## 2020-01-08 DIAGNOSIS — I509 Heart failure, unspecified: Secondary | ICD-10-CM | POA: Insufficient documentation

## 2020-01-08 DIAGNOSIS — F419 Anxiety disorder, unspecified: Secondary | ICD-10-CM | POA: Insufficient documentation

## 2020-01-08 DIAGNOSIS — I110 Hypertensive heart disease with heart failure: Secondary | ICD-10-CM | POA: Diagnosis not present

## 2020-01-08 DIAGNOSIS — Z853 Personal history of malignant neoplasm of breast: Secondary | ICD-10-CM | POA: Diagnosis not present

## 2020-01-08 DIAGNOSIS — J449 Chronic obstructive pulmonary disease, unspecified: Secondary | ICD-10-CM | POA: Diagnosis not present

## 2020-01-08 DIAGNOSIS — Z419 Encounter for procedure for purposes other than remedying health state, unspecified: Secondary | ICD-10-CM

## 2020-01-08 DIAGNOSIS — R9389 Abnormal findings on diagnostic imaging of other specified body structures: Secondary | ICD-10-CM | POA: Diagnosis not present

## 2020-01-08 DIAGNOSIS — Z79899 Other long term (current) drug therapy: Secondary | ICD-10-CM | POA: Insufficient documentation

## 2020-01-08 DIAGNOSIS — M810 Age-related osteoporosis without current pathological fracture: Secondary | ICD-10-CM | POA: Diagnosis not present

## 2020-01-08 DIAGNOSIS — Z9013 Acquired absence of bilateral breasts and nipples: Secondary | ICD-10-CM | POA: Diagnosis not present

## 2020-01-08 DIAGNOSIS — Z87891 Personal history of nicotine dependence: Secondary | ICD-10-CM | POA: Diagnosis not present

## 2020-01-08 DIAGNOSIS — R911 Solitary pulmonary nodule: Secondary | ICD-10-CM | POA: Diagnosis not present

## 2020-01-08 DIAGNOSIS — R918 Other nonspecific abnormal finding of lung field: Secondary | ICD-10-CM | POA: Diagnosis not present

## 2020-01-08 DIAGNOSIS — Z9889 Other specified postprocedural states: Secondary | ICD-10-CM

## 2020-01-08 HISTORY — PX: VIDEO BRONCHOSCOPY WITH ENDOBRONCHIAL NAVIGATION: SHX6175

## 2020-01-08 LAB — CBC
HCT: 26.9 % — ABNORMAL LOW (ref 36.0–46.0)
Hemoglobin: 8.2 g/dL — ABNORMAL LOW (ref 12.0–15.0)
MCH: 26.6 pg (ref 26.0–34.0)
MCHC: 30.5 g/dL (ref 30.0–36.0)
MCV: 87.3 fL (ref 80.0–100.0)
Platelets: 320 10*3/uL (ref 150–400)
RBC: 3.08 MIL/uL — ABNORMAL LOW (ref 3.87–5.11)
RDW: 13.4 % (ref 11.5–15.5)
WBC: 6.8 10*3/uL (ref 4.0–10.5)
nRBC: 0 % (ref 0.0–0.2)

## 2020-01-08 LAB — BASIC METABOLIC PANEL
Anion gap: 10 (ref 5–15)
BUN: 15 mg/dL (ref 8–23)
CO2: 23 mmol/L (ref 22–32)
Calcium: 8.6 mg/dL — ABNORMAL LOW (ref 8.9–10.3)
Chloride: 102 mmol/L (ref 98–111)
Creatinine, Ser: 0.61 mg/dL (ref 0.44–1.00)
GFR calc Af Amer: >60 mL/min (ref >60)
GFR calc non Af Amer: >60 mL/min (ref >60)
Glucose, Bld: 107 mg/dL — ABNORMAL HIGH (ref 70–99)
Potassium: 4.1 mmol/L (ref 3.5–5.1)
Sodium: 135 mmol/L (ref 135–145)

## 2020-01-08 SURGERY — VIDEO BRONCHOSCOPY WITH ENDOBRONCHIAL NAVIGATION
Anesthesia: General

## 2020-01-08 MED ORDER — MIDAZOLAM HCL 2 MG/2ML IJ SOLN
INTRAMUSCULAR | Status: DC | PRN
  Administered 2020-01-08: 2 mg via INTRAVENOUS

## 2020-01-08 MED ORDER — ORAL CARE MOUTH RINSE: 15.0000 mL | Freq: Once | OROMUCOSAL | Status: AC

## 2020-01-08 MED ORDER — FENTANYL CITRATE (PF) 250 MCG/5ML IJ SOLN
INTRAMUSCULAR | Status: AC
  Filled 2020-01-08: qty 5

## 2020-01-08 MED ORDER — SUCCINYLCHOLINE CHLORIDE 200 MG/10ML IV SOSY
PREFILLED_SYRINGE | INTRAVENOUS | Status: AC
  Filled 2020-01-08: qty 10

## 2020-01-08 MED ORDER — PHENYLEPHRINE 40 MCG/ML (10ML) SYRINGE FOR IV PUSH (FOR BLOOD PRESSURE SUPPORT)
PREFILLED_SYRINGE | INTRAVENOUS | Status: DC | PRN
  Administered 2020-01-08 (×4): 80 ug via INTRAVENOUS

## 2020-01-08 MED ORDER — LACTATED RINGERS IV SOLN: INTRAVENOUS | Status: DC

## 2020-01-08 MED ORDER — CHLORHEXIDINE GLUCONATE 0.12 % MT SOLN
15.0000 mL | Freq: Once | OROMUCOSAL | Status: AC
  Administered 2020-01-08: 15 mL via OROMUCOSAL
  Filled 2020-01-08: qty 15

## 2020-01-08 MED ORDER — LIDOCAINE 2% (20 MG/ML) 5 ML SYRINGE
INTRAMUSCULAR | Status: DC | PRN
  Administered 2020-01-08: 60 mg via INTRAVENOUS

## 2020-01-08 MED ORDER — PROMETHAZINE HCL 25 MG/ML IJ SOLN: 6.2500 mg | INTRAMUSCULAR | Status: DC | PRN

## 2020-01-08 MED ORDER — FENTANYL CITRATE (PF) 250 MCG/5ML IJ SOLN
INTRAMUSCULAR | Status: DC | PRN
  Administered 2020-01-08 (×2): 50 ug via INTRAVENOUS

## 2020-01-08 MED ORDER — MIDAZOLAM HCL 2 MG/2ML IJ SOLN
INTRAMUSCULAR | Status: AC
  Filled 2020-01-08: qty 2

## 2020-01-08 MED ORDER — FENTANYL CITRATE (PF) 100 MCG/2ML IJ SOLN: 25.0000 ug | INTRAMUSCULAR | Status: DC | PRN

## 2020-01-08 MED ORDER — EPINEPHRINE PF 1 MG/ML IJ SOLN
INTRAMUSCULAR | Status: AC
  Filled 2020-01-08: qty 1

## 2020-01-08 MED ORDER — PROPOFOL 10 MG/ML IV BOLUS
INTRAVENOUS | Status: AC
  Filled 2020-01-08: qty 20

## 2020-01-08 MED ORDER — PROPOFOL 10 MG/ML IV BOLUS
INTRAVENOUS | Status: DC | PRN
  Administered 2020-01-08: 100 mg via INTRAVENOUS

## 2020-01-08 MED ORDER — DEXAMETHASONE SODIUM PHOSPHATE 10 MG/ML IJ SOLN
INTRAMUSCULAR | Status: AC
  Filled 2020-01-08: qty 1

## 2020-01-08 MED ORDER — 0.9 % SODIUM CHLORIDE (POUR BTL) OPTIME
TOPICAL | Status: DC | PRN
  Administered 2020-01-08: 1000 mL

## 2020-01-08 MED ORDER — EPHEDRINE SULFATE-NACL 50-0.9 MG/10ML-% IV SOSY
PREFILLED_SYRINGE | INTRAVENOUS | Status: DC | PRN
  Administered 2020-01-08 (×3): 10 mg via INTRAVENOUS

## 2020-01-08 MED ORDER — ROCURONIUM BROMIDE 10 MG/ML (PF) SYRINGE
PREFILLED_SYRINGE | INTRAVENOUS | Status: AC
  Filled 2020-01-08: qty 10

## 2020-01-08 MED ORDER — EPINEPHRINE PF 1 MG/ML IJ SOLN
INTRAMUSCULAR | Status: DC | PRN
  Administered 2020-01-08: 1 mg via ENDOTRACHEOPULMONARY

## 2020-01-08 MED ORDER — LIDOCAINE 2% (20 MG/ML) 5 ML SYRINGE
INTRAMUSCULAR | Status: AC
  Filled 2020-01-08: qty 5

## 2020-01-08 MED ORDER — MEPERIDINE HCL 25 MG/ML IJ SOLN: 6.2500 mg | INTRAMUSCULAR | Status: DC | PRN

## 2020-01-08 MED ORDER — EPHEDRINE 5 MG/ML INJ
INTRAVENOUS | Status: AC
  Filled 2020-01-08: qty 10

## 2020-01-08 MED ORDER — ACYCLOVIR 400 MG PO TABS: 400.0000 mg | ORAL_TABLET | Freq: Two times a day (BID) | ORAL | Status: DC

## 2020-01-08 MED ORDER — SUGAMMADEX SODIUM 200 MG/2ML IV SOLN
INTRAVENOUS | Status: DC | PRN
  Administered 2020-01-08: 200 mg via INTRAVENOUS

## 2020-01-08 MED ORDER — PHENYLEPHRINE 40 MCG/ML (10ML) SYRINGE FOR IV PUSH (FOR BLOOD PRESSURE SUPPORT)
PREFILLED_SYRINGE | INTRAVENOUS | Status: AC
  Filled 2020-01-08: qty 10

## 2020-01-08 MED ORDER — DEXAMETHASONE SODIUM PHOSPHATE 10 MG/ML IJ SOLN
INTRAMUSCULAR | Status: DC | PRN
  Administered 2020-01-08: 10 mg via INTRAVENOUS

## 2020-01-08 MED ORDER — ONDANSETRON HCL 4 MG/2ML IJ SOLN
INTRAMUSCULAR | Status: AC
  Filled 2020-01-08: qty 2

## 2020-01-08 MED ORDER — ROCURONIUM BROMIDE 10 MG/ML (PF) SYRINGE
PREFILLED_SYRINGE | INTRAVENOUS | Status: DC | PRN
  Administered 2020-01-08: 20 mg via INTRAVENOUS
  Administered 2020-01-08: 30 mg via INTRAVENOUS

## 2020-01-08 MED ORDER — ONDANSETRON HCL 4 MG/2ML IJ SOLN
INTRAMUSCULAR | Status: DC | PRN
  Administered 2020-01-08: 4 mg via INTRAVENOUS

## 2020-01-08 SURGICAL SUPPLY — 43 items
ADAPTER BRONCHOSCOPE OLYMPUS (ADAPTER) ×3 IMPLANT
ADAPTER VALVE BIOPSY EBUS (MISCELLANEOUS) IMPLANT
ADPTR VALVE BIOPSY EBUS (MISCELLANEOUS)
BRUSH CYTOL CELLEBRITY 1.5X140 (MISCELLANEOUS) IMPLANT
BRUSH CYTOLOGY (MISCELLANEOUS) IMPLANT
BRUSH SUPERTRAX BIOPSY (INSTRUMENTS) ×12 IMPLANT
BRUSH SUPERTRAX NDL-TIP CYTO (INSTRUMENTS) ×3 IMPLANT
CANISTER SUCT 3000ML PPV (MISCELLANEOUS) ×3 IMPLANT
CNTNR URN SCR LID CUP LEK RST (MISCELLANEOUS) ×3 IMPLANT
CONT SPEC 4OZ STRL OR WHT (MISCELLANEOUS) ×9
COVER BACK TABLE 60X90IN (DRAPES) ×3 IMPLANT
COVER WAND RF STERILE (DRAPES) IMPLANT
FILTER STRAW FLUID ASPIR (MISCELLANEOUS) ×3 IMPLANT
FORCEPS BIOP 1.5 SINGLE USE (MISCELLANEOUS) ×3 IMPLANT
FORCEPS BIOP SUPERTRX PREMAR (INSTRUMENTS) ×3 IMPLANT
GAUZE SPONGE 4X4 12PLY STRL (GAUZE/BANDAGES/DRESSINGS) ×3 IMPLANT
GLOVE BIO SURGEON STRL SZ7.5 (GLOVE) ×6 IMPLANT
GOWN STRL REUS W/ TWL LRG LVL3 (GOWN DISPOSABLE) ×2 IMPLANT
GOWN STRL REUS W/TWL LRG LVL3 (GOWN DISPOSABLE) ×6
KIT CLEAN ENDO COMPLIANCE (KITS) ×3 IMPLANT
KIT ILLUMISITE 180 PROCEDURE (KITS) ×3 IMPLANT
KIT ILLUMISITE 90 PROCEDURE (KITS) IMPLANT
KIT LOCATABLE GUIDE (CANNULA) IMPLANT
KIT MARKER FIDUCIAL DELIVERY (KITS) IMPLANT
KIT TURNOVER KIT B (KITS) ×3 IMPLANT
MARKER SKIN DUAL TIP RULER LAB (MISCELLANEOUS) ×3 IMPLANT
NEEDLE SUPERTRX PREMARK BIOPSY (NEEDLE) ×3 IMPLANT
NS IRRIG 1000ML POUR BTL (IV SOLUTION) ×3 IMPLANT
OIL SILICONE PENTAX (PARTS (SERVICE/REPAIRS)) ×3 IMPLANT
PAD ARMBOARD 7.5X6 YLW CONV (MISCELLANEOUS) ×6 IMPLANT
PATCHES PATIENT (LABEL) ×9 IMPLANT
SYR 20ML ECCENTRIC (SYRINGE) ×6 IMPLANT
SYR 20ML LL LF (SYRINGE) ×3 IMPLANT
SYR 50ML SLIP (SYRINGE) ×3 IMPLANT
TOWEL GREEN STERILE FF (TOWEL DISPOSABLE) ×3 IMPLANT
TRAP SPECIMEN MUCUS 40CC (MISCELLANEOUS) ×3 IMPLANT
TUBE CONNECTING 20'X1/4 (TUBING) ×1
TUBE CONNECTING 20X1/4 (TUBING) ×2 IMPLANT
UNDERPAD 30X36 HEAVY ABSORB (UNDERPADS AND DIAPERS) ×3 IMPLANT
VALVE BIOPSY  SINGLE USE (MISCELLANEOUS) ×3
VALVE BIOPSY SINGLE USE (MISCELLANEOUS) ×1 IMPLANT
VALVE SUCTION BRONCHIO DISP (MISCELLANEOUS) ×3 IMPLANT
WATER STERILE IRR 1000ML POUR (IV SOLUTION) ×3 IMPLANT

## 2020-01-08 NOTE — Op Note (Signed)
Video Bronchoscopy with Electromagnetic Navigation Procedure Note  Date of Operation: 01/08/2020  Pre-op Diagnosis: Right upper lobe and lower lobe nodular infiltrates  Post-op Diagnosis: Same  Surgeon: Baltazar Apo  Assistants: None  Anesthesia: General endotracheal anesthesia  Operation: Flexible video fiberoptic bronchoscopy with electromagnetic navigation and biopsies.  Estimated Blood Loss: Minimal  Complications: None apparent  Indications and History: Anna Valentine is a 69 y.o. female with history of bilateral breast cancer, non-small cell lung cancer status post right middle and upper lobe wedge resection.  She also has significant residual bronchiectasis and is colonized with MAIC, Pseudomonas.  She has been dealing with cough, purulent mucus, fevers, sweats, chills.  Her CT chest has shown evolving right upper lobe infiltrate with a rounded pattern, right lower lobe nodular infiltrates as well.  It is been unclear whether this is an infectious process versus a recurrence of malignancy.  We arrange for bronchoscopy today for cultures, biopsies, cytology.  The risks, benefits, complications, treatment options and expected outcomes were discussed with the patient.  The possibilities of pneumothorax, pneumonia, reaction to medication, pulmonary aspiration, perforation of a viscus, bleeding, failure to diagnose a condition and creating a complication requiring transfusion or operation were discussed with the patient who freely signed the consent.    Description of Procedure: The patient was seen in the Preoperative Area, was examined and was deemed appropriate to proceed.  The patient was taken to [facility] in [place], identified as [name] and the procedure verified as Flexible Video Fiberoptic Bronchoscopy.  A Time Out was held and the above information confirmed.   Prior to the date of the procedure a high-resolution CT scan of the chest was performed. Utilizing  Superior a virtual tracheobronchial tree was generated to allow the creation of distinct navigation pathways to the patient's parenchymal abnormalities. After being taken to the operating room general anesthesia was initiated and the patient  was orally intubated. The video fiberoptic bronchoscope was introduced via the endotracheal tube and a general inspection was performed which showed normal main carina.  The left-sided airways were all inspected and were normal.  There were purulent secretions, tan/yellow emanating from the right mainstem, right upper lobe airways.  Samples were obtained for culture, labeled tracheal aspirate.  The right lower lobe airways were in a distorted position but were patent and appeared normal.  The right middle lobe ended in a stump with a staple line.  The right upper lobe airways had secretions emanating but were otherwise normal in appearance, slightly friable and sensitive to scope trauma. The extendable working channel and locator guide were introduced into the bronchoscope. The distinct navigation pathways prepared prior to this procedure were then utilized to navigate to the abnormalities identified on CT scan: Right upper lobe rounded opacity (target 1), right lower lobe peripheral nodule (target 3), right lower lobe peripheral/medial nodule (target 2). The extendable working channel was secured into place at each lesion and the locator guide was withdrawn. Under fluoroscopic guidance transbronchial brushings were performed to be sent for cytology.  Then transbronchial Wang needle biopsies were performed from right upper lobe at target 1.  Finally a bronchioalveolar lavage was performed in the right upper lobe adjacent to target 1 and sent for cytology and microbiology (bacterial, fungal, AFB smears and cultures). At the end of the procedure a general airway inspection was performed and there was no evidence of active bleeding. The bronchoscope was removed.   The patient tolerated the procedure well. There  was no significant blood loss and there were no obvious complications. A post-procedural chest x-ray is pending.  Samples: 1. Transbronchial brushings from right upper lobe opacity, target 1 2.  Transbronchial brushings from right lower lobe nodule, target 2 3.  Transbronchial brushings from right lower lobe nodule, target 3 4. Transbronchial Wang needle biopsies from right upper lobe opacity, target 1 5. Bronchoalveolar lavage from right upper lobe adjacent to target 1 6.  Tracheal aspirate for culture  Plans:  The patient will be discharged from the PACU to home when recovered from anesthesia and after chest x-ray is reviewed. We will review the cytology, pathology and microbiology results with the patient when they become available. Outpatient followup will be with Dr Lamonte Sakai.    Baltazar Apo, MD, PhD 01/08/2020, 3:07 PM [facility] [phone] or if no answer [phone]

## 2020-01-08 NOTE — Discharge Instructions (Signed)
Flexible Bronchoscopy, Care After This sheet gives you information about how to care for yourself after your test. Your doctor may also give you more specific instructions. If you have problems or questions, contact your doctor. Follow these instructions at home: Eating and drinking  Do not eat or drink anything (not even water) for 2 hours after your test, or until your numbing medicine (local anesthetic) wears off.  When your numbness is gone and your cough and gag reflexes have come back, you may: ? Eat only soft foods. ? Slowly drink liquids.  The day after the test, go back to your normal diet. Driving  Do not drive for 24 hours if you were given a medicine to help you relax (sedative).  Do not drive or use heavy machinery while taking prescription pain medicine. General instructions   Take over-the-counter and prescription medicines only as told by your doctor.  Return to your normal activities as told. Ask what activities are safe for you.  Do not use any products that have nicotine or tobacco in them. This includes cigarettes and e-cigarettes. If you need help quitting, ask your doctor.  Keep all follow-up visits as told by your doctor. This is important. It is very important if you had a tissue sample (biopsy) taken. Get help right away if:  You have shortness of breath that gets worse.  You get light-headed.  You feel like you are going to pass out (faint).  You have chest pain.  You cough up: ? More than a little blood. ? More blood than before. Summary  Do not eat or drink anything (not even water) for 2 hours after your test, or until your numbing medicine wears off.  Do not use cigarettes. Do not use e-cigarettes.  Get help right away if you have chest pain.   Please call our office for any questions or concerns.  [phone].  This information is not intended to replace advice given to you by your health care provider. Make sure you discuss any  questions you have with your health care provider. Document Revised: 06/09/2017 Document Reviewed: 07/15/2016 Elsevier Patient Education  2020 [employer].

## 2020-01-08 NOTE — Interval H&P Note (Signed)
History and Physical Interval Note:  01/08/2020 1:20 PM  Anna Valentine  has presented today for surgery, with the diagnosis of ABNORMAL CT OF CHEST; RIGHT LUNG NODULES.  The various methods of treatment have been discussed with the patient and family. After consideration of risks, benefits and other options for treatment, the patient has consented to  Procedure(s): [place] (N/A) as a surgical intervention.  The patient's history has been reviewed, patient examined, no change in status, stable for surgery.  I have reviewed the patient's chart and labs.  Questions were answered to the patient's satisfaction.     Collene Gobble

## 2020-01-08 NOTE — Transfer of Care (Signed)
Immediate Anesthesia Transfer of Care Note  Patient: Anna Valentine  Procedure(s) Performed: VIDEO BRONCHOSCOPY WITH ENDOBRONCHIAL NAVIGATION (N/A )  Patient Location: PACU  Anesthesia Type:General  Level of Consciousness: awake, alert  and oriented  Airway & Oxygen Therapy: Patient Spontanous Breathing  Post-op Assessment: Report given to RN and Post -op Vital signs reviewed and stable  Post vital signs: Reviewed and stable  Last Vitals:  Vitals Value Taken Time  BP 119/74 01/08/20 1509  Temp 36.7 C 01/08/20 1510  Pulse 100 01/08/20 1511  Resp 13 01/08/20 1511  SpO2 95 % 01/08/20 1511  Vitals shown include unvalidated device data.  Last Pain:  Vitals:   01/08/20 1510  TempSrc:   PainSc: 0-No pain      Patients Stated Pain Goal: 4 (28/83/37 4451)  Complications: No complications documented.

## 2020-01-08 NOTE — Assessment & Plan Note (Signed)
Unclear whether this is malignancy recurrence vs untreated / partially-treated infection. She does have hx MAIC, pseudomonas.   We will arrange for bronchoscopy and biopsies.  Goal is to get this done next week as an outpatient.  We will call you with the scheduling information. Please continue Augmentin as you have been taking it until we are able to perform your procedure Follow with Dr Lamonte Sakai in 1 month

## 2020-01-08 NOTE — Anesthesia Procedure Notes (Addendum)
Procedure Name: Intubation Date/Time: 01/08/2020 1:47 PM Performed by: Teressa Lower., CRNA Pre-anesthesia Checklist: Patient identified, Emergency Drugs available, Suction available and Patient being monitored Patient Re-evaluated:Patient Re-evaluated prior to induction Oxygen Delivery Method: Circle system utilized Preoxygenation: Pre-oxygenation with 100% oxygen Induction Type: IV induction and Cricoid Pressure applied Ventilation: Mask ventilation without difficulty Laryngoscope Size: Mac and 3 Grade View: Grade I Tube type: Oral Tube size: 8.5 mm Number of attempts: 1 Placement Confirmation: ETT inserted through vocal cords under direct vision,  positive ETCO2 and breath sounds checked- equal and bilateral Secured at: 23 cm Tube secured with: Tape Dental Injury: Teeth and Oropharynx as per pre-operative assessment

## 2020-01-09 ENCOUNTER — Encounter (HOSPITAL_COMMUNITY): Payer: Self-pay | Admitting: Emergency Medicine

## 2020-01-09 LAB — CYTOLOGY - NON PAP

## 2020-01-09 NOTE — Anesthesia Postprocedure Evaluation (Signed)
Anesthesia Post Note  Patient: Anna Valentine  Procedure(s) Performed: VIDEO BRONCHOSCOPY WITH ENDOBRONCHIAL NAVIGATION (N/A )     Patient location during evaluation: PACU Anesthesia Type: General Level of consciousness: sedated and patient cooperative Pain management: pain level controlled Vital Signs Assessment: post-procedure vital signs reviewed and stable Respiratory status: spontaneous breathing Cardiovascular status: stable Anesthetic complications: no   No complications documented.  Last Vitals:  Vitals:   01/08/20 1128 01/08/20 1510  BP: 130/61 119/74  Pulse: 89 99  Resp: 18 15  Temp: 37.1 C 36.7 C  SpO2: 100% 98%    Last Pain:  Vitals:   01/08/20 1510  TempSrc:   PainSc: 0-No pain                 Nolon Nations

## 2020-01-10 ENCOUNTER — Telehealth: Payer: Self-pay | Admitting: Emergency Medicine

## 2020-01-10 NOTE — Telephone Encounter (Signed)
Discussed cytology and cx results with her by phone >> AFB positive, few pseudomonas, granulomas and no malignancy seen   I suspect this is severe MAIC with lung destruction. May need to have her seen by ID for their assistance. I will confirm speciation and then plan to start 3 drug therapy, probably based on the sensitivities of her prior isolate. She has a possible clarithro allergy that we will have to consider.   Baltazar Apo, MD, PhD 01/10/2020, 6:40 PM [facility] [phone] or if no answer [phone]

## 2020-01-13 LAB — AEROBIC/ANAEROBIC CULTURE W GRAM STAIN (SURGICAL/DEEP WOUND)

## 2020-01-14 ENCOUNTER — Other Ambulatory Visit: Payer: Medicare Other

## 2020-01-14 ENCOUNTER — Ambulatory Visit: Payer: Medicare Other | Admitting: Oncology

## 2020-01-14 ENCOUNTER — Telehealth: Payer: Self-pay | Admitting: Oncology

## 2020-01-14 LAB — MAC SUSCEPTIBILITY BROTH

## 2020-01-14 NOTE — Telephone Encounter (Signed)
R/s apt per 7/6 sch msg - pt aware of new apt date and time

## 2020-01-16 ENCOUNTER — Telehealth: Payer: Self-pay

## 2020-01-16 LAB — AFB ORGANISM ID BY DNA PROBE
M avium complex: POSITIVE — AB
M tuberculosis complex: NEGATIVE

## 2020-01-16 LAB — ACID FAST SMEAR (AFB, MYCOBACTERIA)
Acid Fast Smear: POSITIVE — AB
Acid Fast Smear: POSITIVE — AB

## 2020-01-16 LAB — ACID FAST CULTURE WITH REFLEXED SENSITIVITIES (MYCOBACTERIA): Acid Fast Culture: POSITIVE — AB

## 2020-01-16 NOTE — Telephone Encounter (Signed)
I spoke with her. Told her to come in tomorrow at 12 noon for OV  and will need CBC diff ,path review of smear, B12. Folate, Fe/TIBC as well as ortho BP measurements

## 2020-01-16 NOTE — Telephone Encounter (Signed)
Calling and states that she had a bronchoscopy on 01/08/2020 and on Tuesday 01/14/2020 she went to the grocery store and felt like she was going to pass out. States that when she got home her BP was 81/50. States that she has not taken any BP meds since then and it seems to be back to her normal. Please advise. CB#: [phone]

## 2020-01-17 ENCOUNTER — Encounter: Payer: Self-pay | Admitting: Internal Medicine

## 2020-01-17 ENCOUNTER — Ambulatory Visit (INDEPENDENT_AMBULATORY_CARE_PROVIDER_SITE_OTHER): Payer: Medicare Other | Admitting: Internal Medicine

## 2020-01-17 ENCOUNTER — Other Ambulatory Visit: Payer: Self-pay

## 2020-01-17 VITALS — BP 90/60 | HR 108 | Temp 101.4°F | Ht 66.0 in | Wt 101.0 lb

## 2020-01-17 DIAGNOSIS — D649 Anemia, unspecified: Secondary | ICD-10-CM

## 2020-01-17 DIAGNOSIS — R059 Cough, unspecified: Secondary | ICD-10-CM

## 2020-01-17 DIAGNOSIS — I951 Orthostatic hypotension: Secondary | ICD-10-CM

## 2020-01-17 DIAGNOSIS — R05 Cough: Secondary | ICD-10-CM

## 2020-01-17 DIAGNOSIS — R54 Age-related physical debility: Secondary | ICD-10-CM

## 2020-01-17 DIAGNOSIS — R031 Nonspecific low blood-pressure reading: Secondary | ICD-10-CM | POA: Diagnosis not present

## 2020-01-17 DIAGNOSIS — R6883 Chills (without fever): Secondary | ICD-10-CM

## 2020-01-17 DIAGNOSIS — R55 Syncope and collapse: Secondary | ICD-10-CM

## 2020-01-17 DIAGNOSIS — R634 Abnormal weight loss: Secondary | ICD-10-CM | POA: Diagnosis not present

## 2020-01-17 DIAGNOSIS — R509 Fever, unspecified: Secondary | ICD-10-CM | POA: Diagnosis not present

## 2020-01-17 MED ORDER — ONDANSETRON HCL 4 MG PO TABS: 4.0000 mg | ORAL_TABLET | Freq: Three times a day (TID) | ORAL | 0 refills | Status: DC | PRN

## 2020-01-17 NOTE — Progress Notes (Signed)
   Subjective:    Patient ID: Anna Valentine, female    DOB: 1950/07/18, 69 y.o.   MRN: [id number]  HPI 69 year old Female with recent extensive evaluation by Dr. Lamonte Sakai  including brochoscopy on June 30. She has appt to see Dr. Lamonte Sakai next week. She has atypical Mycobacterium lung infection and currently is on Augmentin but will be seeing Infectious Disease physician soon for consultation regarding treatment.Continues to run fever and has chills intermittently. Has decreased appetite, lethargy, malaise as well as nausea at times. Have refilled Zofran which she should probably take at least twice daily. Reviewed with her possible food choices to increase protein in diet.  Went to grocery store recently and had near syncopal episode. Likely vasovagal but patient worried about anemia. Labs drawn today including prealbumin.  Not sure if she needs  transfusion but is orthostatic in office today. She Is worried about tachycardia but explained that it is likely due to illness and being debilitated. Do not think it needs to be treated. What is important is that she try to eat protein with every meal and take in adequate calories. Is due to see Dr. Jana Hakim soon. Perhaps dietician at [facility] could work with her regarding diet.She lives alone.She lost her husband recently. Has brother in [place] and sister in [place]. A couple of friends  have brought her food and get her mail. Just walking to mailbox is exhausting.  We talked about hospitalization. May be best to avoid unless absolutely necessary. We talked about pros an cons of home health services.    Review of Systems  See above     Objective:   Physical Exam Frail thin female seen in office. Moves slowly. BP drops from 297 ststolic lying to 90 with standing. Pulse atays about the same at 106-108 with each position. Pulse ox 93-96%      Assessment & Plan:  Near syncope- likely vasovagal reaction  Mycobacterium avium lung  colonization  Hx of lung cancer  Hx of breast cancer  Fraility  Anemia- anemia studies done  Nausea- have given Rx for Zofran  Weight loss  Cough  Plan: Take Zofran daily for nausea. Maybe can see dietician at [facility] for help with meal planning. Patient aware needs ID consult, Stay well hydrated. Avoid prolonged standing and heat exposure for now. Will advise further once lab results are back.

## 2020-01-20 ENCOUNTER — Other Ambulatory Visit: Payer: Self-pay | Admitting: Oncology

## 2020-01-20 ENCOUNTER — Telehealth: Payer: Self-pay | Admitting: Oncology

## 2020-01-20 DIAGNOSIS — D509 Iron deficiency anemia, unspecified: Secondary | ICD-10-CM | POA: Insufficient documentation

## 2020-01-20 DIAGNOSIS — D508 Other iron deficiency anemias: Secondary | ICD-10-CM

## 2020-01-20 LAB — IRON, TOTAL/TOTAL IRON BINDING CAP
%SAT: 5 % (calc) — ABNORMAL LOW (ref 16–45)
Iron: 15 ug/dL — ABNORMAL LOW (ref 45–160)
TIBC: 291 mcg/dL (calc) (ref 250–450)

## 2020-01-20 LAB — CBC WITH DIFFERENTIAL/PLATELET
Absolute Monocytes: 785 cells/uL (ref 200–950)
Basophils Absolute: 36 cells/uL (ref 0–200)
Basophils Relative: 0.3 %
Eosinophils Absolute: 48 cells/uL (ref 15–500)
Eosinophils Relative: 0.4 %
HCT: 29.7 % — ABNORMAL LOW (ref 35.0–45.0)
Hemoglobin: 9.6 g/dL — ABNORMAL LOW (ref 11.7–15.5)
Lymphs Abs: 702 cells/uL — ABNORMAL LOW (ref 850–3900)
MCH: 26.9 pg — ABNORMAL LOW (ref 27.0–33.0)
MCHC: 32.3 g/dL (ref 32.0–36.0)
MCV: 83.2 fL (ref 80.0–100.0)
MPV: 9.5 fL (ref 7.5–12.5)
Monocytes Relative: 6.6 %
Neutro Abs: 10329 cells/uL — ABNORMAL HIGH (ref 1500–7800)
Neutrophils Relative %: 86.8 %
Platelets: 516 10*3/uL — ABNORMAL HIGH (ref 140–400)
RBC: 3.57 10*6/uL — ABNORMAL LOW (ref 3.80–5.10)
RDW: 12.8 % (ref 11.0–15.0)
Total Lymphocyte: 5.9 %
WBC: 11.9 10*3/uL — ABNORMAL HIGH (ref 3.8–10.8)

## 2020-01-20 LAB — FOLATE: Folate: 6.9 ng/mL

## 2020-01-20 LAB — FERRITIN: Ferritin: 423 ng/mL — ABNORMAL HIGH (ref 16–288)

## 2020-01-20 LAB — VITAMIN B12: Vitamin B-12: 232 pg/mL (ref 200–1100)

## 2020-01-20 LAB — PREALBUMIN: Prealbumin: 10 mg/dL — ABNORMAL LOW (ref 17–34)

## 2020-01-20 LAB — PATHOLOGIST SMEAR REVIEW

## 2020-01-20 NOTE — Telephone Encounter (Signed)
Scheduled appt per 7/12 sch msg - pt aware of appt date and time

## 2020-01-20 NOTE — Progress Notes (Signed)
Dr. Renold Genta has requested that we treat Anna Valentine with intravenous iron.  I have scheduled her for July 29 today after she sees me and the week after.  I also left her a voicemail letting her know this

## 2020-01-20 NOTE — Patient Instructions (Signed)
Labs drawn and pending. Avoid heat exposure and prolonged standing. Maybe can see dietician at [facility] for help with meal planning. Take Zofran daily for nausea.

## 2020-01-22 ENCOUNTER — Encounter: Payer: Self-pay | Admitting: Emergency Medicine

## 2020-01-22 ENCOUNTER — Other Ambulatory Visit: Payer: Self-pay

## 2020-01-22 ENCOUNTER — Ambulatory Visit (INDEPENDENT_AMBULATORY_CARE_PROVIDER_SITE_OTHER): Payer: Medicare Other | Admitting: Emergency Medicine

## 2020-01-22 VITALS — BP 98/56 | HR 90 | Temp 97.8°F | Ht 66.0 in | Wt 102.6 lb

## 2020-01-22 DIAGNOSIS — J471 Bronchiectasis with (acute) exacerbation: Secondary | ICD-10-CM | POA: Diagnosis not present

## 2020-01-22 DIAGNOSIS — R059 Cough, unspecified: Secondary | ICD-10-CM

## 2020-01-22 DIAGNOSIS — R05 Cough: Secondary | ICD-10-CM | POA: Diagnosis not present

## 2020-01-22 DIAGNOSIS — R9389 Abnormal findings on diagnostic imaging of other specified body structures: Secondary | ICD-10-CM | POA: Diagnosis not present

## 2020-01-22 MED ORDER — RIFAMPIN 300 MG PO CAPS
600.0000 mg | ORAL_CAPSULE | ORAL | 8 refills | Status: DC
Start: 2020-01-22 — End: 2020-02-10

## 2020-01-22 MED ORDER — AZITHROMYCIN 500 MG PO TABS
500.0000 mg | ORAL_TABLET | ORAL | 8 refills | Status: DC
Start: 2020-01-22 — End: 2020-02-10

## 2020-01-22 MED ORDER — ETHAMBUTOL HCL 400 MG PO TABS: 1200.0000 mg | ORAL_TABLET | ORAL | 8 refills | Status: DC

## 2020-01-22 NOTE — Assessment & Plan Note (Addendum)
All of her biopsies and brushings show granulomatous inflammation and her cultures were positive for [facility].  No evidence for malignancy.  If her infiltrates and progressive right lung disease are due to [facility] that this is severe disease.  We need to initiate therapy now.  I will start azithromycin, rifampin, ethambutol and ask her to see ID so that any adjustments can be made as sensitivities are returned. She rightly asked the question of whether she may ultimately require IV antibiotics.  This may be necessary depending on the sensitivities and given her extensive disease.  I am concerned that she may not clear given the degree of disease and evolving cavitation, lung destruction.  She may ultimately require surgical resection.  She is colonized also by Pseudomonas but this entire clinical picture did not change significantly when we were treating her with Levaquin last month.  I think the [facility] is driving her disease.  Hopefully there is no malignancy present that I was unable to uncover with her bronchoscopy.   We will start three drug therapy for MAIC: -Azithromycin 500mg  three times a week -Rifampin 600mg  three times a week -Ethambutol 1200mg  three times a week.  We will refer you to see Infectious Diseases to get there input about the antibiotic regimen.  We will cancel your Ct chest for July. We will plan to reschedule for several months from now after you have been on antibiotics.  We will get you a new flutter valve. Use 1-2x a day, 10 breaths each time. Follow with Dr Lamonte Sakai in 1 month

## 2020-01-22 NOTE — Progress Notes (Signed)
Subjective:    Patient ID: Anna Valentine, female    DOB: 05-Jul-1951, 69 y.o.   MRN: [id number]  HPI  Mycobacterium avium sensitive to clarithromycin, amikacin, moxifloxacin, linezolid. Pseudomonas pansensitive  ROV 12/20/19 --Anna Valentine is Anna and has a history of right sided non-small cell lung cancer status post resection.  Also with bronchiectasis, known Mycobacterium avium, colonized with Pseudomonas, based on bronchoscopy 1 year ago (complicated by pneumothorax).  She had been clinically stable with only waxing and waning nodular disease by CT so we deferred treatment of the [facility].  Since last visit however she has been treated multiple times for exacerbations/pneumonias including in mid May and now again at the beginning of this month.  Is on levofloxacin now.  Based on the recurrent symptoms her CT chest was repeated 12/18/2019 and I have reviewed.  This shows enlargement with thick-walled cavitary lesion in the posterior right upper lobe compared with priors, a new dense area of consolidation extending from the right middle lobe bronchus and right hilum into the lateral segment of the right middle lobe, increased clustered nodularity throughout the right lung.  She restarted the levaquin on , reports that she is a bit better >. No fevers , less cough. She still has R back pain, fatigue.   ROV 01/03/20 --follow-up visit for Anna Valentine, Anna Valentine with a history of right-sided non-small cell lung cancer post resection, bronchiectasis with known Mycobacterium avium and Pseudomonas.  She has been sick for over a month with symptoms consistent with pneumonia, treated with levofloxacin.  There is an enlarging area of thick-walled cavitation and dense consolidation on CT scan, difficult to interpret, could be consistent with infection/abscess but certainly concerning for malignancy. Her fevers, chills, cough have not resolved. The mucous production is less. She is completing Augmentin now.   PET  scan was done 12/31/2019 and I have reviewed.  This shows intense hypermetabolism in the cavitary lesion as well as the right lung parenchyma.  Is also a focus of hypermetabolic peripheral pleural thickening and some scattered other nodule hypermetabolic areas.  ROV 01/22/20 -follow-up visit for Anna Valentine with history of right-sided non-small cell lung cancer, significant bronchiectasis with known Mycobacterium avium.  She had PET positive progressive right lung disease which prompted bronchoscopy which was done on 01/08/2020.  All of her cytologies have shown granulomatous inflammation without any evidence of malignancy.  Her AFB is positive for MAIC, sensitivities pending.  She continues to feel very poorly with significant dyspnea on exertion, fatigue, chills and fever.  She is currently off antibiotics.    Review of Systems  Constitutional: Positive for activity change and fatigue. Negative for fever and unexpected weight change.  HENT: Negative.  Negative for congestion, dental problem, ear pain, nosebleeds, postnasal drip, rhinorrhea, sinus pressure, sneezing, sore throat and trouble swallowing.   Eyes: Negative.  Negative for redness and itching.  Respiratory: Negative.  Negative for cough, chest tightness, shortness of breath and wheezing.   Cardiovascular: Negative.  Negative for palpitations and leg swelling.  Gastrointestinal: Negative.  Negative for nausea and vomiting.  Genitourinary: Negative.  Negative for dysuria.  Musculoskeletal: Positive for back pain. Negative for joint swelling.  Skin: Negative.  Negative for rash.  Neurological: Negative.  Negative for headaches.  Hematological: Negative.  Does not bruise/bleed easily.  Psychiatric/Behavioral: Negative.  Negative for dysphoric mood. The patient is not nervous/anxious.         Objective:   Physical Exam Vitals:   01/22/20 1100  BP: Marland Kitchen)  98/56  Pulse: 90  Temp: 97.8 F (36.6 C)  TempSrc: Oral  SpO2: 98%    Weight: 102 lb 9.6 oz (46.5 kg)  Height: 5\' 6"  (1.676 m)   Gen: Pleasant, thin Valentine, well-nourished, in no distress,  normal affect  ENT: No lesions,  mouth clear,  oropharynx clear, no postnasal drip  Neck: No JVD, no stridor  Lungs: No use of accessory muscles, coarse R lung with some inspiratory and expiratory rhonchi  Cardiovascular: RRR, heart sounds normal, no murmur or gallops, no peripheral edema  Musculoskeletal: No deformities, no cyanosis or clubbing  Neuro: alert, non focal  Skin: Warm, no lesions or rash     Assessment & Plan:  Abnormal CT of the chest All of her biopsies and brushings show granulomatous inflammation and her cultures were positive for [facility].  No evidence for malignancy.  If her infiltrates and progressive right lung disease are due to [facility] that this is severe disease.  We need to initiate therapy now.  I will start azithromycin, rifampin, ethambutol and ask her to see ID so that any adjustments can be made as sensitivities are returned. She rightly asked the question of whether she may ultimately require IV antibiotics.  This may be necessary depending on the sensitivities and given her extensive disease.  I am concerned that she may not clear given the degree of disease and evolving cavitation, lung destruction.  She may ultimately require surgical resection.  She is colonized also by Pseudomonas but this entire clinical picture did not change significantly when we were treating her with Levaquin last month.  I think the [facility] is driving her disease.  Hopefully there is no malignancy present that I was unable to uncover with her bronchoscopy.   We will start three drug therapy for MAIC: -Azithromycin 500mg  three times a week -Rifampin 600mg  three times a week -Ethambutol 1200mg  three times a week.  We will refer you to see Infectious Diseases to get there input about the antibiotic regimen.  We will cancel your Ct chest for July. We will plan to  reschedule for several months from now after you have been on antibiotics.  We will get you a new flutter valve. Use 1-2x a day, 10 breaths each time. Follow with Dr Lamonte Sakai in 1 month  Baltazar Apo, MD, PhD 01/22/2020, 1:52 PM [facility] [phone] or if no answer [phone]

## 2020-01-22 NOTE — Patient Instructions (Addendum)
We will start three drug therapy for MAIC: -Azithromycin 500mg  three times a week -Rifampin 600mg  three times a week -Ethambutol 1200mg  three times a week.  We will refer you to see Infectious Diseases to get there input about the antibiotic regimen.  We will cancel your Ct chest for July. We will plan to reschedule for several months from now after you have been on antibiotics.  We will get you a new flutter valve. Use 1-2x a day, 10 breaths each time. Follow with Dr Lamonte Sakai in 1 month

## 2020-01-23 NOTE — Progress Notes (Signed)
Many thanks. 

## 2020-01-24 LAB — MAC SUSCEPTIBILITY BROTH
Clarithromycin: 4
Linezolid: 64
Moxifloxacin: 4
Streptomycin: 64

## 2020-01-24 LAB — AFB ORGANISM ID BY DNA PROBE
M avium complex: POSITIVE — AB
M tuberculosis complex: NEGATIVE

## 2020-01-24 LAB — ACID FAST CULTURE WITH REFLEXED SENSITIVITIES (MYCOBACTERIA): Acid Fast Culture: POSITIVE — AB

## 2020-01-28 ENCOUNTER — Inpatient Hospital Stay: Payer: Medicare Other | Attending: Oncology | Admitting: Nutrition

## 2020-01-28 ENCOUNTER — Other Ambulatory Visit: Payer: Self-pay

## 2020-01-28 ENCOUNTER — Telehealth: Payer: Self-pay | Admitting: Nutrition

## 2020-01-28 DIAGNOSIS — C3431 Malignant neoplasm of lower lobe, right bronchus or lung: Secondary | ICD-10-CM | POA: Insufficient documentation

## 2020-01-28 DIAGNOSIS — R531 Weakness: Secondary | ICD-10-CM | POA: Insufficient documentation

## 2020-01-28 DIAGNOSIS — Z806 Family history of leukemia: Secondary | ICD-10-CM | POA: Insufficient documentation

## 2020-01-28 DIAGNOSIS — R232 Flushing: Secondary | ICD-10-CM | POA: Insufficient documentation

## 2020-01-28 DIAGNOSIS — Z8371 Family history of colonic polyps: Secondary | ICD-10-CM | POA: Insufficient documentation

## 2020-01-28 DIAGNOSIS — Z803 Family history of malignant neoplasm of breast: Secondary | ICD-10-CM | POA: Insufficient documentation

## 2020-01-28 DIAGNOSIS — Z800 Family history of malignant neoplasm of digestive organs: Secondary | ICD-10-CM | POA: Insufficient documentation

## 2020-01-28 DIAGNOSIS — Z9013 Acquired absence of bilateral breasts and nipples: Secondary | ICD-10-CM | POA: Insufficient documentation

## 2020-01-28 DIAGNOSIS — M8580 Other specified disorders of bone density and structure, unspecified site: Secondary | ICD-10-CM | POA: Insufficient documentation

## 2020-01-28 DIAGNOSIS — Z8042 Family history of malignant neoplasm of prostate: Secondary | ICD-10-CM | POA: Insufficient documentation

## 2020-01-28 DIAGNOSIS — C50411 Malignant neoplasm of upper-outer quadrant of right female breast: Secondary | ICD-10-CM | POA: Insufficient documentation

## 2020-01-28 DIAGNOSIS — Z79899 Other long term (current) drug therapy: Secondary | ICD-10-CM | POA: Insufficient documentation

## 2020-01-28 DIAGNOSIS — Z801 Family history of malignant neoplasm of trachea, bronchus and lung: Secondary | ICD-10-CM | POA: Insufficient documentation

## 2020-01-28 DIAGNOSIS — Z836 Family history of other diseases of the respiratory system: Secondary | ICD-10-CM | POA: Insufficient documentation

## 2020-01-28 DIAGNOSIS — Z170 Estrogen receptor positive status [ER+]: Secondary | ICD-10-CM | POA: Insufficient documentation

## 2020-01-28 DIAGNOSIS — Z87891 Personal history of nicotine dependence: Secondary | ICD-10-CM | POA: Insufficient documentation

## 2020-01-28 MED ORDER — ONDANSETRON HCL 4 MG PO TABS: 4.0000 mg | ORAL_TABLET | Freq: Three times a day (TID) | ORAL | 2 refills | Status: DC | PRN

## 2020-01-28 NOTE — Telephone Encounter (Signed)
Consult received to discuss tips for improving nausea.  Patient is under observation secondary to breast cancer.  Her nausea does not seem to be related to her cancer. Patient has lost 13% of her usual body weight in less than a year which is significant.  She takes 1 Zofran daily however it is not controlling her nausea.  She reports eating continues to be challenging.  She has recently been placed on 3 antibiotics for [facility].  She is concerned about her weight loss.  Educated patient on strategies for improving nausea with food choices. Recommended patient take Zofran as prescribed, 1 every 8 hours as needed. Encourage patient to consume small amounts of food every 2 hours sticking to a bland diet. Recommended sipping on fluids such as ginger ale, ginger tea and water.  I encourage patient to try tart beverages and recommended a tart juice flavored protein drink for additional calories and protein.  I emailed fact sheets to patient's email address at her request.  She has my contact information.  All her questions were answered to her satisfaction.

## 2020-01-28 NOTE — Progress Notes (Signed)
See telephone note.

## 2020-01-30 ENCOUNTER — Other Ambulatory Visit: Payer: Self-pay

## 2020-01-30 ENCOUNTER — Encounter (INDEPENDENT_AMBULATORY_CARE_PROVIDER_SITE_OTHER): Payer: Medicare Other | Admitting: Ophthalmology

## 2020-01-30 ENCOUNTER — Ambulatory Visit (INDEPENDENT_AMBULATORY_CARE_PROVIDER_SITE_OTHER): Payer: Medicare Other | Admitting: Ophthalmology

## 2020-01-30 ENCOUNTER — Encounter (INDEPENDENT_AMBULATORY_CARE_PROVIDER_SITE_OTHER): Payer: Self-pay | Admitting: Ophthalmology

## 2020-01-30 DIAGNOSIS — H33101 Unspecified retinoschisis, right eye: Secondary | ICD-10-CM

## 2020-01-30 DIAGNOSIS — H2511 Age-related nuclear cataract, right eye: Secondary | ICD-10-CM | POA: Diagnosis not present

## 2020-01-30 DIAGNOSIS — H35371 Puckering of macula, right eye: Secondary | ICD-10-CM | POA: Diagnosis not present

## 2020-01-30 DIAGNOSIS — H2512 Age-related nuclear cataract, left eye: Secondary | ICD-10-CM | POA: Diagnosis not present

## 2020-01-30 NOTE — Assessment & Plan Note (Signed)

## 2020-01-30 NOTE — Assessment & Plan Note (Signed)

## 2020-01-30 NOTE — Progress Notes (Signed)
01/30/2020     CHIEF COMPLAINT Patient presents for Retina Follow Up   HISTORY OF PRESENT ILLNESS: Anna Valentine is a 69 y.o. female who presents to the clinic today for:   HPI    Retina Follow Up    Patient presents with  Other.  In both eyes.  Duration of 1 year.  Since onset it is stable.          Comments    1 year & 7 month follow up - OCT OU Patient denies change in vision and overall has no complaints. Patient is currently grieving loss of husband.       Last edited by Gerda Diss on 01/30/2020  9:55 AM. (History)      Referring physician: [name], MD [address] [place],  [place] [id number]  HISTORICAL INFORMATION:   Selected notes from the [facility]: No current outpatient medications on file. (Ophthalmic Drugs)   No current facility-administered medications for this visit. (Ophthalmic Drugs)   Current Outpatient Medications (Other)  Medication Sig  . acyclovir (ZOVIRAX) 400 MG tablet Take 1 tablet (400 mg total) by mouth 2 (two) times daily.  Marland Kitchen albuterol (VENTOLIN HFA) 108 (90 Base) MCG/ACT inhaler Inhale 2 puffs into the lungs every 6 (six) hours as needed for wheezing or shortness of breath.  Marland Kitchen azithromycin (ZITHROMAX) 500 MG tablet Take 1 tablet (500 mg total) by mouth every other day. 3 times a week Mon, Wed, Fri  . calcium-vitamin D (OSCAL WITH D) 500-200 MG-UNIT tablet Take 1 tablet by mouth daily.  . cholecalciferol (VITAMIN D) 1000 UNITS tablet Take 1,000 Units by mouth daily.  Marland Kitchen ethambutol (MYAMBUTOL) 400 MG tablet Take 3 tablets (1,200 mg total) by mouth every other day. 3 times a day Mon, Wed, Fri  . Ferrous Sulfate (IRON) 28 MG TABS Take by mouth.  Marland Kitchen ibuprofen (ADVIL) 200 MG tablet Take 200 mg by mouth every 6 (six) hours as needed.  Marland Kitchen losartan (COZAAR) 25 MG tablet Take 1 tablet (25 mg total) by mouth 2 (two) times daily. (Patient not taking: Reported on 01/22/2020)  . ondansetron  (ZOFRAN) 4 MG tablet Take 1 tablet (4 mg total) by mouth every 8 (eight) hours as needed for nausea or vomiting.  . Psyllium (METAMUCIL PO) Take 1 Scoop by mouth daily as needed (constipation).   . rifampin (RIFADIN) 300 MG capsule Take 2 capsules (600 mg total) by mouth every other day. 3 times a week  Mon, Wed, Fri  . tamoxifen (NOLVADEX) 20 MG tablet TAKE 1 TABLET BY MOUTH EVERY DAY (Patient taking differently: Take 20 mg by mouth daily. )  . vitamin B-12 (CYANOCOBALAMIN) 500 MCG tablet Take 500 mcg by mouth daily.  . vitamin C (ASCORBIC ACID) 500 MG tablet Take 500 mg by mouth daily.   No current facility-administered medications for this visit. (Other)      REVIEW OF SYSTEMS:    ALLERGIES Allergies  Allergen Reactions  . Taxol [Paclitaxel] Anaphylaxis  . Tape Itching  . Abraxane [Paclitaxel Protein-Bound Part] Rash  . Clarithromycin Rash    PAST MEDICAL HISTORY Past Medical History:  Diagnosis Date  . Anemia   . BRCA negative 2011   BRCA I/ II negative  . Breast cancer ([place]) 08/2009   stage 2, rx with lumpectomy and xrt  . CHF (congestive heart failure) ([place])   . Constipation   . COPD (chronic obstructive pulmonary disease) ([place])  pt.unsure of diagnosis status  . Emphysema of lung ([place])    pt. questions diagnosis  . Family history of adverse reaction to anesthesia    My Sister has nausea  . Family history of breast cancer   . Family history of colon cancer   . Family history of lung cancer   . GERD (gastroesophageal reflux disease)    not presently having symptoms  . Hypertension   . Lung cancer ([place]) 06/06/05   stage 1 poorly differentiated adenocarcinoma, s/p right lower lobectomy.  . Osteoporosis   . Personal history of lung cancer   . Pneumonia    2007ish, walking pnemonia - 2009 ish  . STD (sexually transmitted disease)    HSV   Past Surgical History:  Procedure Laterality Date  . BREAST BIOPSY    . BREAST LUMPECTOMY  10/12/2009   Left lumpectomy  and radiation, stage II, ER/PR+, Her 2 nu negative  . BUNIONECTOMY Bilateral   . COLONOSCOPY    . COLONOSCOPY    . LOBECTOMY Right 06/06/2005   [place]  . MASTECTOMY W/ SENTINEL NODE BIOPSY Bilateral 03/15/2018  . MASTECTOMY W/ SENTINEL NODE BIOPSY Bilateral 03/15/2018   Procedure: BILATERAL TOTAL MASTECTOMIES WITH RIGHT SENTINEL LYMPH NODE BIOPSY;  Surgeon: Rolm Bookbinder, MD;  Location: [facility];  Service: General;  Laterality: Bilateral;  . PORTACATH PLACEMENT N/A 03/15/2018   Procedure: INSERTION PORT-A-CATH WITH [place];  Surgeon: Rolm Bookbinder, MD;  Location: [facility];  Service: General;  Laterality: N/A;  . TONSILLECTOMY    . TUBAL LIGATION  1984  . VIDEO BRONCHOSCOPY  02/06/2019   Flexible video fiberoptic bronchoscopy with electromagnetic navigation and biopsies.  Marland Kitchen VIDEO BRONCHOSCOPY WITH ENDOBRONCHIAL NAVIGATION Left 02/06/2019   Procedure: VIDEO BRONCHOSCOPY WITH ENDOBRONCHIAL NAVIGATION, left lung;  Surgeon: Collene Gobble, MD;  Location: [facility];  Service: Thoracic;  Laterality: Left;  Marland Kitchen VIDEO BRONCHOSCOPY WITH ENDOBRONCHIAL NAVIGATION N/A 01/08/2020   Procedure: VIDEO BRONCHOSCOPY WITH ENDOBRONCHIAL NAVIGATION;  Surgeon: Collene Gobble, MD;  Location: [facility];  Service: Thoracic;  Laterality: N/A;    FAMILY HISTORY Family History  Problem Relation Age of Onset  . Allergies Mother   . Asthma Mother   . Lung cancer Mother   . Breast cancer Mother 49       recurrence age 30  . Colon cancer Father 1  . Prostate cancer Brother 43  . Breast cancer Sister 46       Recurrence age 51 BRCA negative  . Colon polyps Sister   . Leukemia Sister   . Breast cancer Sister 71  . Prostate cancer Brother 47  . Breast cancer Maternal Grandmother 45  . Colon cancer Maternal Aunt   . Leukemia Maternal Grandfather   . Lung cancer Maternal Aunt   . Breast cancer Cousin   . Esophageal cancer Neg Hx   . Rectal cancer Neg Hx   . Stomach cancer Neg Hx     SOCIAL HISTORY Social History    Tobacco Use  . Smoking status: Former Smoker    Packs/day: 2.00    Years: 18.00    Pack years: 36.00    Quit date: 07/12/1987    Years since quitting: 32.5  . Smokeless tobacco: Never Used  Vaping Use  . Vaping Use: Never used  Substance Use Topics  . Alcohol use: Yes    Alcohol/week: 14.0 standard drinks    Types: 7 Glasses of wine, 7 Shots of liquor per week    Comment: social  .  Drug use: No         OPHTHALMIC EXAM: Base Eye Exam    Visual Acuity (Snellen - Linear)      Right Left   Dist cc 20/30+2 20/25-2       Tonometry (Tonopen, 10:00 AM)      Right Left   Pressure 14 14       Pupils      Pupils Dark Light Shape React APD   Right PERRL 5 4 Round Slow None   Left PERRL 5 4 Round Slow None       Visual Fields (Counting fingers)      Left Right    Full Full       Extraocular Movement      Right Left    Full Full       Neuro/Psych    Oriented x3: Yes   Mood/Affect: Normal       Dilation    Both eyes: 1.0% Mydriacyl, 2.5% Phenylephrine @ 10:00 AM        Slit Lamp and Fundus Exam    External Exam      Right Left   External Normal Normal       Slit Lamp Exam      Right Left   Lids/Lashes Normal Normal   Conjunctiva/Sclera White and quiet White and quiet   Cornea Clear Clear   Anterior Chamber Deep and quiet Deep and quiet   Iris Round and reactive Round and reactive   Vitreous Normal Normal          IMAGING AND PROCEDURES  Imaging and Procedures for 01/30/20  OCT, Retina - OU - Both Eyes       Right Eye Quality was good. Scan locations included subfoveal.   Left Eye Quality was good. Scan locations included subfoveal.                 ASSESSMENT/PLAN:  No problem-specific Assessment & Plan notes found for this encounter.      ICD-10-CM   1. Right epiretinal membrane  H35.371   2. 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 Nuclear sclerotic cataract of right eye  H25.11     1.Epiretinal membrane with foveal macular  schisis, actually slightly improved as compared to last year.  This could be a change due to a deformation change in the overlying epiretinal membrane, with stable acuity will observe.  2.  Patient undergoing therapy for Mycobacterium avium-intracellulare E, with oral medications, she this includes ethambutol for which we will look for optic nerve damage over time.  I did asked the patient if she is on multivitamins, I have asked her to ask her infectious disease consultant whether vitamin supplementation may help forestall ethambutol toxicity buildup.  3.  Dr. Herbert Deaner as scheduled  Ophthalmic Meds Ordered this visit:  No orders of the defined types were placed in this encounter.      No follow-ups on file.  There are no Patient Instructions on file for this visit.   Explained the diagnoses, plan, and follow up with the patient and they expressed understanding.  Patient expressed understanding of the importance of proper follow up care.   Clent Demark Serenitie Vinton M.D. Diseases & Surgery of the Retina and Vitreous [facility]ina [facility] Di[facility] [place] 01/30/20     Abbreviations: M myopia (nearsighted); A astigmatism; H hyperopia (farsighted); P presbyopia; Mrx spectacle prescription;  CTL contact lenses; OD right eye; OS left eye; OU both eyes  XT exotropia; ET esotropia;  PEK punctate epithelial keratitis; PEE punctate epithelial erosions; DES dry eye syndrome; MGD meibomian gland dysfunction; ATs artificial tears; PFAT's preservative free artificial tears; [place] nuclear sclerotic cataract; PSC posterior subcapsular cataract; ERM epi-retinal membrane; PVD posterior vitreous detachment; RD retinal detachment; DM diabetes mellitus; DR diabetic retinopathy; NPDR non-proliferative diabetic retinopathy; PDR proliferative diabetic retinopathy; CSME clinically significant macular edema; DME diabetic macular edema; dbh dot blot hemorrhages; CWS cotton wool spot; POAG primary open angle glaucoma; C/D cup-to-disc ratio;  HVF humphrey visual field; GVF goldmann visual field; OCT optical coherence tomography; IOP intraocular pressure; BRVO Branch retinal vein occlusion; CRVO central retinal vein occlusion; CRAO central retinal artery occlusion; BRAO branch retinal artery occlusion; RT retinal tear; SB scleral buckle; PPV pars plana vitrectomy; VH Vitreous hemorrhage; PRP panretinal laser photocoagulation; IVK intravitreal kenalog; VMT vitreomacular traction; MH Macular hole;  NVD neovascularization of the disc; NVE neovascularization elsewhere; AREDS age related eye disease study; ARMD age related macular degeneration; POAG primary open angle glaucoma; EBMD epithelial/anterior basement membrane dystrophy; ACIOL anterior chamber intraocular lens; IOL intraocular lens; PCIOL posterior chamber intraocular lens; Phaco/IOL phacoemulsification with intraocular lens placement; [place] photorefractive keratectomy; LASIK laser assisted in situ keratomileusis; HTN hypertension; DM diabetes mellitus; COPD chronic obstructive pulmonary disease

## 2020-01-31 ENCOUNTER — Other Ambulatory Visit: Payer: Medicare Other

## 2020-02-05 ENCOUNTER — Other Ambulatory Visit: Payer: Self-pay

## 2020-02-05 ENCOUNTER — Inpatient Hospital Stay (HOSPITAL_BASED_OUTPATIENT_CLINIC_OR_DEPARTMENT_OTHER): Payer: Medicare Other | Admitting: Oncology

## 2020-02-05 ENCOUNTER — Inpatient Hospital Stay: Payer: Medicare Other

## 2020-02-05 VITALS — BP 109/58 | HR 99 | Temp 98.3°F | Resp 17 | Ht 66.0 in | Wt 100.2 lb

## 2020-02-05 DIAGNOSIS — Z800 Family history of malignant neoplasm of digestive organs: Secondary | ICD-10-CM | POA: Diagnosis not present

## 2020-02-05 DIAGNOSIS — Z8371 Family history of colonic polyps: Secondary | ICD-10-CM | POA: Diagnosis not present

## 2020-02-05 DIAGNOSIS — Z9013 Acquired absence of bilateral breasts and nipples: Secondary | ICD-10-CM | POA: Diagnosis not present

## 2020-02-05 DIAGNOSIS — Z806 Family history of leukemia: Secondary | ICD-10-CM | POA: Diagnosis not present

## 2020-02-05 DIAGNOSIS — C50811 Malignant neoplasm of overlapping sites of right female breast: Secondary | ICD-10-CM

## 2020-02-05 DIAGNOSIS — C3431 Malignant neoplasm of lower lobe, right bronchus or lung: Secondary | ICD-10-CM

## 2020-02-05 DIAGNOSIS — Z87891 Personal history of nicotine dependence: Secondary | ICD-10-CM | POA: Diagnosis not present

## 2020-02-05 DIAGNOSIS — Z79899 Other long term (current) drug therapy: Secondary | ICD-10-CM | POA: Diagnosis not present

## 2020-02-05 DIAGNOSIS — Z170 Estrogen receptor positive status [ER+]: Secondary | ICD-10-CM

## 2020-02-05 DIAGNOSIS — Z801 Family history of malignant neoplasm of trachea, bronchus and lung: Secondary | ICD-10-CM | POA: Diagnosis not present

## 2020-02-05 DIAGNOSIS — Z836 Family history of other diseases of the respiratory system: Secondary | ICD-10-CM | POA: Diagnosis not present

## 2020-02-05 DIAGNOSIS — Z8042 Family history of malignant neoplasm of prostate: Secondary | ICD-10-CM | POA: Diagnosis not present

## 2020-02-05 DIAGNOSIS — C50411 Malignant neoplasm of upper-outer quadrant of right female breast: Secondary | ICD-10-CM | POA: Diagnosis not present

## 2020-02-05 DIAGNOSIS — Z803 Family history of malignant neoplasm of breast: Secondary | ICD-10-CM | POA: Diagnosis not present

## 2020-02-05 DIAGNOSIS — C50212 Malignant neoplasm of upper-inner quadrant of left female breast: Secondary | ICD-10-CM | POA: Diagnosis not present

## 2020-02-05 DIAGNOSIS — R232 Flushing: Secondary | ICD-10-CM | POA: Diagnosis not present

## 2020-02-05 DIAGNOSIS — M8580 Other specified disorders of bone density and structure, unspecified site: Secondary | ICD-10-CM | POA: Diagnosis not present

## 2020-02-05 DIAGNOSIS — R531 Weakness: Secondary | ICD-10-CM | POA: Diagnosis not present

## 2020-02-05 LAB — CMP (CANCER CENTER ONLY)
ALT: 42 U/L (ref 0–44)
AST: 39 U/L (ref 15–41)
Albumin: 2.5 g/dL — ABNORMAL LOW (ref 3.5–5.0)
Alkaline Phosphatase: 119 U/L (ref 38–126)
Anion gap: 11 (ref 5–15)
BUN: 13 mg/dL (ref 8–23)
CO2: 25 mmol/L (ref 22–32)
Calcium: 9.5 mg/dL (ref 8.9–10.3)
Chloride: 96 mmol/L — ABNORMAL LOW (ref 98–111)
Creatinine: 0.78 mg/dL (ref 0.44–1.00)
GFR, Est AFR Am: >60 mL/min (ref >60)
GFR, Estimated: >60 mL/min (ref >60)
Glucose, Bld: 134 mg/dL — ABNORMAL HIGH (ref 70–99)
Potassium: 3.8 mmol/L (ref 3.5–5.1)
Sodium: 132 mmol/L — ABNORMAL LOW (ref 135–145)
Total Bilirubin: 0.3 mg/dL (ref 0.3–1.2)
Total Protein: 6.9 g/dL (ref 6.5–8.1)

## 2020-02-05 LAB — CBC WITH DIFFERENTIAL (CANCER CENTER ONLY)
Abs Immature Granulocytes: 0.09 10*3/uL — ABNORMAL HIGH (ref 0.00–0.07)
Basophils Absolute: 0 10*3/uL (ref 0.0–0.1)
Basophils Relative: 0 %
Eosinophils Absolute: 0 10*3/uL (ref 0.0–0.5)
Eosinophils Relative: 0 %
HCT: 25.8 % — ABNORMAL LOW (ref 36.0–46.0)
Hemoglobin: 8.1 g/dL — ABNORMAL LOW (ref 12.0–15.0)
Immature Granulocytes: 1 %
Lymphocytes Relative: 5 %
Lymphs Abs: 0.5 10*3/uL — ABNORMAL LOW (ref 0.7–4.0)
MCH: 25.9 pg — ABNORMAL LOW (ref 26.0–34.0)
MCHC: 31.4 g/dL (ref 30.0–36.0)
MCV: 82.4 fL (ref 80.0–100.0)
Monocytes Absolute: 0.8 10*3/uL (ref 0.1–1.0)
Monocytes Relative: 8 %
Neutro Abs: 8.6 10*3/uL — ABNORMAL HIGH (ref 1.7–7.7)
Neutrophils Relative %: 86 %
Platelet Count: 412 10*3/uL — ABNORMAL HIGH (ref 150–400)
RBC: 3.13 MIL/uL — ABNORMAL LOW (ref 3.87–5.11)
RDW: 14.4 % (ref 11.5–15.5)
WBC Count: 10.2 10*3/uL (ref 4.0–10.5)
nRBC: 0 % (ref 0.0–0.2)

## 2020-02-05 MED ORDER — MIRTAZAPINE 7.5 MG PO TABS: 7.5000 mg | ORAL_TABLET | Freq: Every day | ORAL | 6 refills | Status: DC

## 2020-02-05 MED ORDER — GABAPENTIN 300 MG PO CAPS
300.0000 mg | ORAL_CAPSULE | Freq: Every day | ORAL | 4 refills | Status: DC
Start: 2020-02-05 — End: 2020-08-26

## 2020-02-05 MED ORDER — TAMOXIFEN CITRATE 20 MG PO TABS: 20.0000 mg | ORAL_TABLET | Freq: Every day | ORAL | 12 refills | Status: DC

## 2020-02-05 MED ORDER — PROCHLORPERAZINE MALEATE 5 MG PO TABS
10.0000 mg | ORAL_TABLET | Freq: Four times a day (QID) | ORAL | 6 refills | Status: DC | PRN
Start: 2020-02-05 — End: 2020-03-31

## 2020-02-05 MED ORDER — FOLIC ACID 1 MG PO TABS
1.0000 mg | ORAL_TABLET | Freq: Every day | ORAL | 6 refills | Status: DC
Start: 2020-02-05 — End: 2020-08-26

## 2020-02-05 NOTE — Progress Notes (Signed)
[place]  Telephone:([phone]) [phone] Fax:(336) [phone]    ID: Anna Valentine DOB: Nov 23, 1950  MR#: [id number]  OIZ#:[id number]  Patient Care Team: [name], MD as PCP - General (Internal Medicine) Magrinat, Virgie Dad, MD as Consulting Physician (Oncology) Collene Gobble, MD as Consulting Physician (Pulmonary Disease) [name], MD as Referring Physician Armbruster, Carlota Raspberry, MD as Consulting Physician (Gastroenterology) Kem Boroughs, [place] (Family Medicine) [name], MD as Consulting Physician (Radiation Oncology) Rolm Bookbinder, MD as Consulting Physician (General Surgery) Larey Dresser, MD as Consulting Physician (Cardiology) Bensimhon, Shaune Pascal, MD as Consulting Physician (Cardiology)   CHIEF COMPLAINT:  (1) Estrogen receptor positive left-sided breast cancer (2011)    (2) history of stage IA right lung adenocarcinoma resected November 2006    (3) triple- positive right-sided breast cancer    (4) homozygous p53 mutation/ Li-Fraumeni syndrome   CURRENT TREATMENT: Tamoxifen   INTERVAL HISTORY: Anna Valentine returns today for follow-up of her triple positive breast cancer and history of homozygous p53 mutation.  Since her last visit, she presented to her PCP on 12/02/2019 with fever and shortness of breath. Covid-19 test and respiratory virus panel performed that day were negative. Chest x-ray showed: similar right lung volume loss; question small pleural effusion with associated fibrosis in right base.  This prompted a chest CT performed on 12/18/2019 showing: stable right lower post-lobectomy findings; new dense, mass-like consolidation about right hilum, right middle lobe bronchus, and lateral right middle lobe, measuring at least 5.8 cm; interval enlargement of a thick-walled cavitary lesion of posterior right upper lobe; findings consistent with worsened atypical Mycobacterium.  She proceeded to PET scan on 12/31/2019 showing: intense metabolic  activity associated with right upper lobe consolidation, as well as with additional pleural nodules and a smaller foci of consolidation in lower right lung; cavitary lesion in superior right lung with moderate metabolic activity; no hypermetabolic mediastinal lymph nodes; focus of bronchiectasis and nodularity in lingula.  She then underwent bronchoscopy on 01/08/2020 under Dr. Lamonte Sakai. Cytology from the procedure ([id number]) showed no malignant cells in any of the right lung targets.  The fluid was positive for MAC sensitive clarithromycin   REVIEW  OF SYSTEMS: Anna Valentine is very weak right now.  She thinks she may be getting a little better now that she is on the triple antibiotic combination for her MAC.  She has had no visual changes and she did see her retinal specialist.  She has night sweats which are a bother for her.  She has no appetite.  Food tastes different.  She has some nausea and is now on Zofran which is helping a little bit.  Walking to be mailbox or walking around the store is more than she can do right now.  Also I was not aware her husband died only a few weeks ago from a sudden heart attack.  She is alone at home.  Detailed review of systems today was otherwise noncontributory   LUNG CANCER HISTORY:  From Dr. [name] original intake note 07/26/2005: (Lung cancer presentation)  "The patient is a very pleasant 69 year old female without a significant past medical history, who states that in 04/2005 she felt a lump in the left neck.  It did not resolve spontaneously, so she was seen by Dr. [name] [facility], and a CT scan of the neck was performed on 04/25/05.  There was noted to be two lymph nodes on the left, and they were normal in size, but they were asymmetrical.  No other pathologic findings were noted, except for left internal jugular vein, which was atypically positioned.  However, because of these enlarged lymph nodes, a CT scan of the chest was also  obtained by Dr. Renold Genta on 04/27/05, and the CT scan did reveal a poorly defined nodular opacity medially in the right upper lobe, measuring 6.5 mg anterior to posterior.  Within the right lower lobe there was noted to be a lobular nodular mass measuring 13 x 13 mm, and was felt to be worrisome for a primary lung carcinoma.  No other nodules or effusions were seen on the left lung.  On the mediastinal window images there was slight prominence of right hilar nodes, but no other evidence of mediastinal or hilar adenopathy.  A PET scan was thereafter obtained on 05/06/05.  The PET scan did reveal increased FDG activity within a small mass in the posterior superior right lower lobe, highly suspicious for a malignancy.  No significant nodal FDG uptake was identified within the hilar regions or the mediastinum.  Also noted was abnormal FDG uptake in the right middle lobe, corresponding to the CT scan findings, and again suspicious for a malignancy.  There was no evidence of metastatic disease within the neck, abdomen or pelvis.  The patient was then referred to [facility], and was seen by Dr. [name].  The patient underwent a right lower lobe wedge resection with completion lobectomy and mediastinal lymph node biopsies.  The pathology revealed the following:  Right upper lobe wedge resection revealed pulmonary tissue with necrotizing granulomatous inflammation.  No evidence of malignancy.  Right middle lobe wedge revealed again pulmonary tissue with necrotizing granulomatous inflammation.  No evidence of malignancy.  The right pleural lobe (lobectomy) revealed an adenocarcinoma, poorly differentiated, grade 3, measuring 1.2 cm.  The visceral pleura was negative.  Chest wall negative.  Mediastinum negative.  All margins were negative, including the pleural and parenchymal margins.  There was no evidence of lymphovascular invasion.  In all lymph nodes sampled, including level 11, level 7, level 12, 4R and 2R lymph nodes negative  for malignancy.  The patient was staged as T1 N0 M0, stage IA adenocarcinoma of the right lung.  Postoperatively the patient's course was complicated by the development of pleural effusion, requiring diuresis.  She had multiple x-rays performed.  Last x-ray performed on 07/07/05 revealed persistent right pleural effusion.  It was recommended that the patient have followups at [facility].  Dr. [name] Baxley kindly refers the patient to me today for medical oncology evaluation.  Clinically, the patient states that she has recovered well.  She is once again walking.  She was a runner, and she is trying to get back in shape."  LEFT BREAST CANCER HISTORY:From [name]'s 09/30/2009 note:  "She tells me that most recently she had her yearly screening mammogram performed that revealed an abnormality.  She also on exam was noted to have a palpable lump in the upper left breast as well.  Because of the abnormality, patient on September 02, 2009 had a digital diagnostic mammogram of the left breast and ultrasound of the left breast.  The mammogram revealed a 7.0 mm area slightly increased density in the upper left breast.  There were no suspicious calcifications noted.  The breast was heterogeneously dense.  Patient then went onto have an ultrasound of the breast performed and again a 7.0 mm irregular hypoechoic shadowing mass was noted in the 12 o'Valentine position of the left breast 5.0 cm  from the nipple.  There were no enlarged or abnormal left axillary lymph nodes identified.  Patient went onto have a core biopsy performed on 09/02/2009 [phone]).  The needle core biopsy of the 12 o'Valentine mass revealed an invasive mammary carcinoma, low to intermediate grade.  It was lobular carcinoma.  Confirmatory immunohistochemical stains revealed the tumor to be strongly positive for cytokeratin AE1-AE3 with an infiltrative pattern of growth and the tumor was negative for E-cadherin stain confirming a lobular  phenotype.  The tumor was estrogen receptor positive at 98%, progesterone receptor positive 6%, proliferation marker Ki-67 by MIB was 14%.  The tumor did not express HER-2/neu by CISH with a signal of 1.04.  Patient was seen by Dr. Neldon Mc on 09/08/2009 for discussion of surgical options.  His recommendation was a lumpectomy.  Patient also had bilateral MRI of the breasts performed, which revealed a 0.8 cm mildly enlarged area of mass-like enhancement at the 12 o'Valentine location corresponding to the biopsy-proven breast cancer.  There was no evidence of lymphadenopathy."   PAST MEDICAL HISTORY: Past Medical History:  Diagnosis Date  . Anemia   . BRCA negative 2011   BRCA I/ II negative  . Breast cancer ([place]) 08/2009   stage 2, rx with lumpectomy and xrt  . CHF (congestive heart failure) ([place])   . Constipation   . COPD (chronic obstructive pulmonary disease) ([facility])    pt.unsure of diagnosis status  . Emphysema of lung ([place])    pt. questions diagnosis  . Family history of adverse reaction to anesthesia    My Sister has nausea  . Family history of breast cancer   . Family history of colon cancer   . Family history of lung cancer   . GERD (gastroesophageal reflux disease)    not presently having symptoms  . Hypertension   . Lung cancer ([place]) 06/06/05   stage 1 poorly differentiated adenocarcinoma, s/p right lower lobectomy.  . Osteoporosis   . Personal history of lung cancer   . Pneumonia    2007ish, walking pnemonia - 2009 ish  . STD (sexually transmitted disease)    HSV    PAST SURGICAL HISTORY: Past Surgical History:  Procedure Laterality Date  . BREAST BIOPSY    . BREAST LUMPECTOMY  10/12/2009   Left lumpectomy and radiation, stage II, ER/PR+, Her 2 nu negative  . BUNIONECTOMY Bilateral   . COLONOSCOPY    . COLONOSCOPY    . LOBECTOMY Right 06/06/2005   [place]  . MASTECTOMY W/ SENTINEL NODE BIOPSY Bilateral 03/15/2018  . MASTECTOMY W/ SENTINEL NODE BIOPSY Bilateral  03/15/2018   Procedure: BILATERAL TOTAL MASTECTOMIES WITH RIGHT SENTINEL LYMPH NODE BIOPSY;  Surgeon: Rolm Bookbinder, MD;  Location: [facility];  Service: General;  Laterality: Bilateral;  . PORTACATH PLACEMENT N/A 03/15/2018   Procedure: INSERTION PORT-A-CATH WITH [place];  Surgeon: Rolm Bookbinder, MD;  Location: [facility];  Service: General;  Laterality: N/A;  . TONSILLECTOMY    . TUBAL LIGATION  1984  . VIDEO BRONCHOSCOPY  02/06/2019   Flexible video fiberoptic bronchoscopy with electromagnetic navigation and biopsies.  Marland Kitchen VIDEO BRONCHOSCOPY WITH ENDOBRONCHIAL NAVIGATION Left 02/06/2019   Procedure: VIDEO BRONCHOSCOPY WITH ENDOBRONCHIAL NAVIGATION, left lung;  Surgeon: Collene Gobble, MD;  Location: [place];  Service: Thoracic;  Laterality: Left;  Marland Kitchen VIDEO BRONCHOSCOPY WITH ENDOBRONCHIAL NAVIGATION N/A 01/08/2020   Procedure: VIDEO BRONCHOSCOPY WITH ENDOBRONCHIAL NAVIGATION;  Surgeon: Collene Gobble, MD;  Location: [place];  Service: Thoracic;  Laterality: N/A;  FAMILY HISTORY Family History  Problem Relation Age of Onset  . Allergies Mother   . Asthma Mother   . Lung cancer Mother   . Breast cancer Mother 75       recurrence age 84  . Colon cancer Father 85  . Prostate cancer Brother 51  . Breast cancer Sister 2       Recurrence age 73 BRCA negative  . Colon polyps Sister   . Leukemia Sister   . Breast cancer Sister 5  . Prostate cancer Brother 73  . Breast cancer Maternal Grandmother 43  . Colon cancer Maternal Aunt   . Leukemia Maternal Grandfather   . Lung cancer Maternal Aunt   . Breast cancer Cousin   . Esophageal cancer Neg Hx   . Rectal cancer Neg Hx   . Stomach cancer Neg Hx    The patient's maternal grandmother was diagnosed with cancer at age 47. Patient's mother was diagnosed at age 40. The patient's sister was diagnosed at age 11 with recurrence at 60 and is BRCA negative; she was also diagnosed with leukemia and underwent a bone marrow transplant before passing in 11/2018.  A second sister was diagnosed at age 74.   GYNECOLOGIC HISTORY:  No LMP recorded. Patient is postmenopausal. Menarche age 89, the patient is GX P0. She went through the change of life in approximately age 89. She did not take hormone replacement.   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 It's just the 2 of them at home. She takes care of 57-year-old for a neighbor and is the primary caregiver for her husband who has had a couple of small strokes and has myelodysplasia. She helps a friend out with her business. She walks her 42 year old lab. 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 Smoking status: Former Smoker    Packs/day: 2.00    Years: 18.00    Pack years: 36.00    Quit date: 07/12/1987    Years since quitting: 32.5  . Smokeless tobacco: Never Used  Vaping Use  . Vaping Use: Never used  Substance Use Topics  . Alcohol use: Yes    Alcohol/week: 14.0 standard drinks    Types: 7 Glasses of wine, 7 Shots of liquor per week    Comment: social  . Drug use: No    Allergies  Allergen Reactions  . Taxol [Paclitaxel] Anaphylaxis  . Tape Itching  . Abraxane [Paclitaxel Protein-Bound Part] Rash  . Clarithromycin Rash    Current Outpatient Medications  Medication Sig Dispense Refill  . acyclovir (ZOVIRAX) 400 MG tablet Take 1 tablet (400 mg total) by mouth 2 (two) times daily.    Marland Kitchen albuterol (VENTOLIN HFA) 108 (90 Base) MCG/ACT inhaler Inhale 2 puffs into the lungs every 6 (six) hours as needed for wheezing or shortness of breath. 8 g prn  . azithromycin (ZITHROMAX) 500 MG tablet Take 1 tablet (500 mg total) by mouth every other day. 3 times a week Mon, Wed, Fri 30 tablet 8  . calcium-vitamin D (OSCAL WITH D) 500-200 MG-UNIT tablet Take 1 tablet by mouth daily.    . cholecalciferol  (VITAMIN D) 1000 UNITS tablet Take 1,000 Units by mouth daily.    Marland Kitchen ethambutol (MYAMBUTOL) 400 MG tablet Take 3 tablets (1,200 mg total) by mouth every  other day. 3 times a day Mon, Wed, Fri 30 tablet 8  . Ferrous Sulfate (IRON) 28 MG TABS Take by mouth.    . gabapentin (NEURONTIN) 300 MG capsule Take 1 capsule (300 mg total) by mouth at bedtime. 90 capsule 4  . ibuprofen (ADVIL) 200 MG tablet Take 200 mg by mouth every 6 (six) hours as needed.    Marland Kitchen losartan (COZAAR) 25 MG tablet Take 1 tablet (25 mg total) by mouth 2 (two) times daily. (Patient not taking: Reported on 01/22/2020) 180 tablet 3  . mirtazapine (REMERON) 7.5 MG tablet Take 1 tablet (7.5 mg total) by mouth at bedtime. 60 tablet 6  . ondansetron (ZOFRAN) 4 MG tablet Take 1 tablet (4 mg total) by mouth every 8 (eight) hours as needed for nausea or vomiting. 90 tablet 2  . prochlorperazine (COMPAZINE) 5 MG tablet Take 2 tablets (10 mg total) by mouth every 6 (six) hours as needed for nausea or vomiting. 60 tablet 6  . Psyllium (METAMUCIL PO) Take 1 Scoop by mouth daily as needed (constipation).     . rifampin (RIFADIN) 300 MG capsule Take 2 capsules (600 mg total) by mouth every other day. 3 times a week  Mon, Wed, Fri 30 capsule 8  . tamoxifen (NOLVADEX) 20 MG tablet Take 1 tablet (20 mg total) by mouth daily. 90 tablet 12  . vitamin B-12 (CYANOCOBALAMIN) 500 MCG tablet Take 500 mcg by mouth daily.    . vitamin C (ASCORBIC ACID) 500 MG tablet Take 500 mg by mouth daily.     No current facility-administered medications for this visit.    OBJECTIVE:  white woman who appears moderately cachectic  Vitals:   02/05/20 1537  BP: (!) 109/58  Pulse: 99  Resp: 17  Temp: 98.3 F (36.8 C)  SpO2: 99%     Body mass index is 16.17 kg/m.    ECOG FS:1 - Symptomatic but completely ambulatory  Sclerae unicteric, EOMs intact Wearing a mask No cervical or supraclavicular adenopathy Lungs no rales or rhonchi Heart regular rate and rhythm Abd  soft, nontender, positive bowel sounds MSK no focal spinal tenderness, no upper extremity lymphedema Neuro: nonfocal, well oriented, appropriate affect Breasts: Status post bilateral mastectomies with no evidence of local recurrence   LAB RESULTS:  CMP     Component Value Date/Time   NA 132 (L) 02/05/2020 1525   NA 139 12/22/2016 1141   K 3.8 02/05/2020 1525   K 4.2 12/22/2016 1141   CL 96 (L) 02/05/2020 1525   CL 103 11/22/2012 1025   CO2 25 02/05/2020 1525   CO2 28 12/22/2016 1141   GLUCOSE 134 (H) 02/05/2020 1525   GLUCOSE 92 12/22/2016 1141   GLUCOSE 94 11/22/2012 1025   BUN 13 02/05/2020 1525   BUN 13.2 12/22/2016 1141   CREATININE 0.78 02/05/2020 1525   CREATININE 0.69 12/17/2019 1654   CREATININE 0.7 12/22/2016 1141   CALCIUM 9.5 02/05/2020 1525   CALCIUM 9.7 12/22/2016 1141   PROT 6.9 02/05/2020 1525   PROT 7.0 12/22/2016 1141   ALBUMIN 2.5 (L) 02/05/2020 1525   ALBUMIN 3.8 12/22/2016 1141   AST 39 02/05/2020 1525   AST 18 12/22/2016 1141   ALT 42 02/05/2020 1525   ALT 10 12/22/2016 1141   ALKPHOS 119 02/05/2020 1525   ALKPHOS 81 12/22/2016 1141   BILITOT 0.3 02/05/2020 1525   BILITOT 0.65 12/22/2016 1141   GFRNONAA >60 02/05/2020 1525   GFRNONAA 89 12/17/2019 1654   GFRAA >  60 02/05/2020 1525   GFRAA 104 12/17/2019 1654    INo results found for: SPEP, UPEP  Lab Results  Component Value Date   WBC 10.2 02/05/2020   NEUTROABS 8.6 (H) 02/05/2020   HGB 8.1 (L) 02/05/2020   HCT 25.8 (L) 02/05/2020   MCV 82.4 02/05/2020   PLT 412 (H) 02/05/2020      Chemistry      Component Value Date/Time   NA 132 (L) 02/05/2020 1525   NA 139 12/22/2016 1141   K 3.8 02/05/2020 1525   K 4.2 12/22/2016 1141   CL 96 (L) 02/05/2020 1525   CL 103 11/22/2012 1025   CO2 25 02/05/2020 1525   CO2 28 12/22/2016 1141   BUN 13 02/05/2020 1525   BUN 13.2 12/22/2016 1141   CREATININE 0.78 02/05/2020 1525   CREATININE 0.69 12/17/2019 1654   CREATININE 0.7 12/22/2016 1141        Component Value Date/Time   CALCIUM 9.5 02/05/2020 1525   CALCIUM 9.7 12/22/2016 1141   ALKPHOS 119 02/05/2020 1525   ALKPHOS 81 12/22/2016 1141   AST 39 02/05/2020 1525   AST 18 12/22/2016 1141   ALT 42 02/05/2020 1525   ALT 10 12/22/2016 1141   BILITOT 0.3 02/05/2020 1525   BILITOT 0.65 12/22/2016 1141       Lab Results  Component Value Date   LABCA2 18 09/30/2009    No components found for: [id number]  No results for input(s): INR in the last 168 hours.  Urinalysis    Component Value Date/Time   BILIRUBINUR NEG 12/17/2019 1654   PROTEINUR Negative 12/17/2019 1654   UROBILINOGEN 0.2 12/17/2019 1654   NITRITE NEG 12/17/2019 1654   LEUKOCYTESUR Negative 12/17/2019 1654    STUDIES: DG Chest Port 1 View  Result Date: 01/08/2020 CLINICAL DATA:  Postoperative status. EXAM: PORTABLE CHEST 1 VIEW COMPARISON:  Dec 02, 2019. FINDINGS: Stable cardiomediastinal silhouette. Diffuse opacities are noted throughout the right lung most consistent with scarring or fibrosis, but superimposed inflammation or atelectasis cannot be excluded. Left lung is unremarkable. No pneumothorax is noted. Bony thorax is unremarkable. IMPRESSION: Diffuse opacities are noted throughout the right lung most consistent with scarring or fibrosis, but superimposed inflammation or atelectasis cannot be excluded. Electronically Signed   By: Marijo Conception M.D.   On: 01/08/2020 15:26   OCT, Retina - OU - Both Eyes  Result Date: 01/30/2020 Right Eye Quality was good. Scan locations included subfoveal. Central Foveal Thickness: 375. Progression has been stable. Findings include epiretinal membrane. Left Eye Quality was good. Scan locations included subfoveal. Central Foveal Thickness: 274. Progression has been stable. Notes Epiretinal membrane with foveal macular schisis, actually slightly improved as compared to last year.  This could be a change due to a deformation change in the overlying epiretinal membrane,  with stable acuity will observe.  DG C-ARM BRONCHOSCOPY  Result Date: 01/08/2020 C-ARM BRONCHOSCOPY: Fluoroscopy was utilized by the requesting physician.  No radiographic interpretation.     ASSESSMENT: 69 y.o. [place], [place] woman status post right upper lobe wedge resection, middle lobe wedge resection, lower lobectomy and mediastinal lymph node dissection 06/06/2005 for a 1.2 cm grade 3 adenocarcinoma, pT1 pN1, stage Ia  (a) [facility] with every other year chest CT, most recently 06/06/2017  LEFT BREAST CANCER: (1) status post left breast upper outer quadrant biopsy 09/02/2009 for an invasive lobular carcinoma (E-cadherin negative) estrogen receptor 98% positive, progesterone receptor 6% positive, with an MIB-1 of 14% and no  HER-2 amplification. [[id number]]  (2) status post left lumpectomy and sentinel lymph node sampling 10/12/2009 for a pT1b pN1, stage IIA invasive lobular breast cancer, with negative margins.  (3) Oncotype DX score of 16 predicts a risk of outside the breast recurrence of 10% if the patient's only systemic treatment is tamoxifen for 5 years. It also predicts no benefit from adjuvant chemotherapy  (4) completed adjuvant radiation therapy 01/28/2010, receiving 5040 cGy to the left breast, with a boost to the upper inner aspect of the breast (to a cumulative dose of 6300 cGy); the axillary and supraclavicular regions received 4500 cGy  (5) started anastrozole July 2011, completedI 5 years July 2016  RIGHT BREAST CANCER: (6) status post right breast upper outer quadrant biopsy 01/29/2018 for a clinical  T1c N0, stage IA invasive ductal carcinoma, grade 2, estrogen receptor positive, progesterone receptor negative, HER-2 amplified, with an MIB-1 of 15%  (7) status post bilateral mastectomy with right sentinel lymph node sampling 03/15/2018, showing  (a) on the left, no malignancy noted  (b) on the right, a pT1c pN0,stage IA invasive ductal carcinoma, grade 2, with  negative margins.  (c) a total of 6 lymph nodes removed from the right axilla, none from the left  (8) chemotherapy consisting of paclitaxel weekly x12 started 04/26/2018, with trastuzumab to be given for 1 year  (a) myocardial perfusion study 12/20/2017 showed an ejection fraction of 75% (hyperdynamic  (b) paclitaxel switched to Abraxane with the dose #2 because of initial reaction to Taxol  (c) Abraxane discontinued 07/13/2018 with continuing side effects  (d) received a total of 10 paclitaxel last Abraxane doses of 12 planned  (9) adjuvant radiation not indicated  (10) tamoxifen started 08/01/2018, on hold after August 2020 because of spotting  (11) bone density 02/21/2012 at [place] showed osteoporosis with a T score of -2.5; on alendronate  (12) repeat genetics testing 02/08/2018 offered through [facility] Common Hereditary Cancers Panel and 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 There were no deleterious mutations in: APC, ATM, AXIN2, BARD1, BMPR1A, BRCA1, BRCA2, BRIP1, CDH1, CDKN2A (p14ARF), CDKN2A (p16INK4a), CKD4, CHEK2, CTNNA1, DICER1, EPCAM (Deletion/duplication testing only), GREM1 (promoter region deletion/duplication testing only), KIT, MEN1, MLH1, MSH2, MSH3, MSH6, MUTYH, NBN, NF1, NHTL1, PALB2, PDGFRA, PMS2, POLD1, POLE, PTEN, RAD50, RAD51C, RAD51D, SDHB, SDHC, SDHD, SMAD4, SMARCA4. STK11, TSC1, TSC2, and VHL.  The following genes were evaluated for sequence changes only: SDHA and HOXB13 c.251G>A variant only.  (a) see "cancer surveillance" for details of long term Maylon Peppers related screening studies   (i) s/p bilateral mastectomies   (ii) colonoscopy recommended every 2 to 5 years: Next scheduled September 2020   (iii) brain MRI every year: However patient has significant claustrophobia   (iiii) consider referral to a Li-Fraumeni syndrome clinic   PLAN: Dilynn is struggling with her MAC, and she has lost a lot of weight, is very weak, and is short  of breath.  I am hopeful will be triple antibiotics (azithromycin, rifampin, and ethambutol) can pull her out of this hole and she does tell me her sputum at least appears to be clearing somewhat.  I am starting her on folate daily given the ethambutol and also on mirtazapine at 7.5 mg at bedtime which I think will help her sleep and help her appetite.  It may also help with the fact that she is adjusting to the sudden loss of her husband so recently.  I also wrote her for Compazine in case the Zofran  is not sufficient.  Also I gabapentin at bedtime for the nighttime hot flashes.  We discussed diet issues and I encouraged her to of course be a good amount of protein but also increase her carbohydrate intake (potatoes, pastel, rice, bread).  Hopefully she can begin to work on increasing her weight over the next 2 to 3 months.  She is seeing Dr. Lamonte Sakai on a monthly basis.  She is going to see me in 6 months but she can call us if any problems develop before then  Total encounter time 30 minutes.Sarajane Jews C. Berlynn Warsame, MD 02/05/20 4:29 PM Medical Oncology and Hematology [facility] [place], [place] [address] Tel. [phone]    Fax. [phone]    I, Wilburn Mylar, am acting as scribe for Dr. Virgie Dad. Naava Janeway.  I, Lurline Del MD, have reviewed the above documentation for accuracy and completeness, and I agree with the above.   *Total Encounter Time as defined by the Centers for Medicare and Medicaid Services includes, in addition to the face-to-face time of a patient visit (documented in the note above) non-face-to-face time: obtaining and reviewing outside history, ordering and reviewing medications, tests or procedures, care coordination (communications with other health care professionals or caregivers) and documentation in the medical record.

## 2020-02-06 ENCOUNTER — Inpatient Hospital Stay: Payer: Medicare Other

## 2020-02-06 ENCOUNTER — Other Ambulatory Visit: Payer: Self-pay

## 2020-02-06 ENCOUNTER — Telehealth: Payer: Self-pay | Admitting: Oncology

## 2020-02-06 VITALS — BP 116/74 | HR 101 | Temp 97.5°F | Resp 16

## 2020-02-06 DIAGNOSIS — C3431 Malignant neoplasm of lower lobe, right bronchus or lung: Secondary | ICD-10-CM | POA: Diagnosis not present

## 2020-02-06 DIAGNOSIS — R232 Flushing: Secondary | ICD-10-CM | POA: Diagnosis not present

## 2020-02-06 DIAGNOSIS — Z170 Estrogen receptor positive status [ER+]: Secondary | ICD-10-CM

## 2020-02-06 DIAGNOSIS — M8580 Other specified disorders of bone density and structure, unspecified site: Secondary | ICD-10-CM | POA: Diagnosis not present

## 2020-02-06 DIAGNOSIS — Z95828 Presence of other vascular implants and grafts: Secondary | ICD-10-CM

## 2020-02-06 DIAGNOSIS — R531 Weakness: Secondary | ICD-10-CM | POA: Diagnosis not present

## 2020-02-06 DIAGNOSIS — C50411 Malignant neoplasm of upper-outer quadrant of right female breast: Secondary | ICD-10-CM | POA: Diagnosis not present

## 2020-02-06 MED ORDER — SODIUM CHLORIDE 0.9 % IV SOLN
510.0000 mg | Freq: Once | INTRAVENOUS | Status: AC
  Administered 2020-02-06: 510 mg via INTRAVENOUS
  Filled 2020-02-06: qty 510

## 2020-02-06 MED ORDER — SODIUM CHLORIDE 0.9 % IV SOLN
Freq: Once | INTRAVENOUS | Status: AC
  Filled 2020-02-06: qty 250

## 2020-02-06 NOTE — Telephone Encounter (Signed)
Scheduled appts per 7/28 los. Left voicemail with appt date and time.

## 2020-02-06 NOTE — Patient Instructions (Signed)

## 2020-02-07 LAB — FUNGUS CULTURE WITH STAIN

## 2020-02-07 LAB — FUNGUS CULTURE RESULT

## 2020-02-07 LAB — FUNGAL ORGANISM REFLEX

## 2020-02-10 ENCOUNTER — Encounter: Payer: Self-pay | Admitting: Internal Medicine

## 2020-02-10 ENCOUNTER — Other Ambulatory Visit: Payer: Self-pay

## 2020-02-10 ENCOUNTER — Ambulatory Visit (INDEPENDENT_AMBULATORY_CARE_PROVIDER_SITE_OTHER): Payer: Medicare Other | Admitting: Internal Medicine

## 2020-02-10 DIAGNOSIS — A310 Pulmonary mycobacterial infection: Secondary | ICD-10-CM

## 2020-02-10 MED ORDER — AZITHROMYCIN 500 MG PO TABS: 500.0000 mg | ORAL_TABLET | Freq: Every day | ORAL | 11 refills | Status: DC

## 2020-02-10 MED ORDER — RIFAMPIN 300 MG PO CAPS: 600.0000 mg | ORAL_CAPSULE | ORAL | 11 refills | Status: DC

## 2020-02-10 MED ORDER — ETHAMBUTOL HCL 400 MG PO TABS: 800.0000 mg | ORAL_TABLET | ORAL | 11 refills | Status: DC

## 2020-02-10 NOTE — Progress Notes (Signed)
[facility] for Infectious Disease  Reason for Consult: Mycobacterium avium pneumonia Referring Provider: Dr. Baltazar Apo  Assessment: She has developed symptomatic Mycobacterium avium pneumonia.  Given her extensive cavitary lung disease I would prefer her have her on daily antibiotic therapy.  She will also need to change from rifampin to rifabutin to avoid an adverse drug drug interaction with her tamoxifen which can reduce tamoxifen levels.  Plan: 1. Continue azithromycin and ethambutol.  Change rifampin to rifabutin and dose all 3 antibiotics daily. 2. Follow-up in 4 weeks  Patient Active Problem List   Diagnosis Date Noted  . Mycobacterium avium infection ([place]) 02/11/2020    Priority: High  . Right epiretinal membrane 01/30/2020  . 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 Nuclear sclerotic cataract of right eye 01/30/2020  . Nuclear sclerotic cataract of left eye 01/30/2020  . Iron deficiency anemia 01/20/2020  . Malignant neoplasm of overlapping sites of right breast in female, estrogen receptor positive ([place]) 09/20/2019  . Weight loss, non-intentional 09/20/2019  . Iatrogenic pneumothorax 02/06/2019  . Postprocedural pneumothorax   . 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 Port-A-Cath in place 04/26/2018  . Genetic testing 02/20/2018  . Family history of breast cancer   . Family history of lung cancer   . Personal history of lung cancer   . Chest pain 11/28/2017  . Abnormal CT of the chest 11/30/2016  . Malignant neoplasm of upper-inner quadrant of left breast in female, estrogen receptor positive ([place]) 06/16/2014  . Osteopenia 09/13/2013  . Postmenopausal atrophic vaginitis 09/13/2013  . Vitamin D deficiency 09/13/2013  . Malignant neoplasm of lower lobe of right lung ([place]) 07/24/2012  . History of lung cancer 03/11/2011  . Anxiety 03/11/2011  . History of neutropenia 03/11/2011  . Family history of colon cancer 03/11/2011  . Cough 11/15/2010  .  Bronchiectasis without acute exacerbation ([place]) 11/15/2010    Patient's Medications  New Prescriptions   No medications on file  Previous Medications   ACYCLOVIR (ZOVIRAX) 400 MG TABLET    Take 1 tablet (400 mg total) by mouth 2 (two) times daily.   ALBUTEROL (VENTOLIN HFA) 108 (90 BASE) MCG/ACT INHALER    Inhale 2 puffs into the lungs every 6 (six) hours as needed for wheezing or shortness of breath.   CALCIUM-VITAMIN D (OSCAL WITH D) 500-200 MG-UNIT TABLET    Take 1 tablet by mouth daily.   CHOLECALCIFEROL (VITAMIN D) 1000 UNITS TABLET    Take 1,000 Units by mouth daily.   FERROUS SULFATE (IRON) 28 MG TABS    Take by mouth.    FOLIC ACID (FOLVITE) 1 MG TABLET    Take 1 tablet (1 mg total) by mouth daily.   GABAPENTIN (NEURONTIN) 300 MG CAPSULE    Take 1 capsule (300 mg total) by mouth at bedtime.   IBUPROFEN (ADVIL) 200 MG TABLET    Take 200 mg by mouth every 6 (six) hours as needed.    LOSARTAN (COZAAR) 25 MG TABLET    Take 1 tablet (25 mg total) by mouth 2 (two) times daily.   MIRTAZAPINE (REMERON) 7.5 MG TABLET    Take 1 tablet (7.5 mg total) by mouth at bedtime.   ONDANSETRON (ZOFRAN) 4 MG TABLET    Take 1 tablet (4 mg total) by mouth every 8 (eight) hours as needed for nausea or vomiting.   PROCHLORPERAZINE (COMPAZINE) 5 MG TABLET    Take 2 tablets (10 mg total) by mouth every 6 (six) hours as  needed for nausea or vomiting.   PSYLLIUM (METAMUCIL PO)    Take 1 Scoop by mouth daily as needed (constipation).     TAMOXIFEN (NOLVADEX) 20 MG TABLET    Take 20 mg by mouth daily.   VITAMIN B-12 (CYANOCOBALAMIN) 500 MCG TABLET    Take 500 mcg by mouth daily.    VITAMIN C (ASCORBIC ACID) 500 MG TABLET    Take 500 mg by mouth daily.  Modified Medications   Modified Medication Previous Medication   AZITHROMYCIN (ZITHROMAX) 500 MG TABLET azithromycin (ZITHROMAX) 500 MG tablet      Take 1 tablet (500 mg total) by mouth daily. 3 times a week Mon, Wed, Fri    Take 1 tablet (500 mg total) by mouth  every other day. 3 times a week Mon, Wed, Fri   ETHAMBUTOL (MYAMBUTOL) 400 MG TABLET ethambutol (MYAMBUTOL) 400 MG tablet      Take 2 tablets (800 mg total) by mouth every other day. 3 times a day Mon, Wed, Fri    Take 3 tablets (1,200 mg total) by mouth every other day. 3 times a day Mon, Wed, Fri  Discontinued Medications   RIFAMPIN (RIFADIN) 300 MG CAPSULE    Take 2 capsules (600 mg total) by mouth every other day. 3 times a week  Mon, Wed, Fri    HPI: REMINGTYN DEPAOLA is a 69 y.o. female former smoker with a history of bronchiectasis and cavitary lung disease.  She also has a remote history of lung cancer and underwent right lower lobectomy.  She is currently being treated for breast cancer.  She had an abnormal PET scan last year and underwent bronchoscopy last July.  Cultures were positive for Mycobacterium avium.  She was not having fever, cough or shortness of breath so she was not treated at that time.  Her husband died in 12/11/2022 and shortly afterward and she began to develop fever, chills, sweats, productive cough and shortness of breath.  A CT scan was obtained on 12/18/2019 which showed a new mass in the right hilum, increased thickening of the wall of the cavitary lesion in the right upper lobe and new extensive nodularity throughout the right lung.  A PET scan was obtained on 12/31/2019 which showed increased metabolic activity in the right lung.  She underwent bronchoscopy on 01/08/2020.  Pathology review showed granulomatous and acute inflammation.  There was no evidence of malignancy.  AFB cultures grew Mycobacterium avium again.  Antibiotic susceptibilities are pending.  She was started on 3 times weekly azithromycin, ethambutol and rifampin on 01/22/2020.  She is not having any problems tolerating her antibiotics.  She is not sure if she can tell any difference how she feels.  It sounds like her fever, chills and sweats may be better.  Her appetite remains poor and she describes early  satiety.  When I asked her about her mood she said that she does not think she is depressed.  She says that she has a great circle of friends who are helping her now.  She says that she has been so sick and busy that she decided to problems her husband she would grieve for him later.  Review of Systems: Review of Systems  Constitutional: Positive for chills, diaphoresis, fever, malaise/fatigue and weight loss.  HENT: Negative for congestion and sore throat.   Respiratory: Positive for cough, sputum production, shortness of breath and wheezing. Negative for hemoptysis.   Cardiovascular: Negative for chest pain.  Gastrointestinal: Negative for abdominal  pain, diarrhea, nausea and vomiting.  Genitourinary: Negative for dysuria.  Musculoskeletal: Negative for myalgias.  Skin: Negative for rash.      Past Medical History:  Diagnosis Date  . Anemia   . BRCA negative 2011   BRCA I/ II negative  . Breast cancer ([facility]) 08/2009   stage 2, rx with lumpectomy and xrt  . CHF (congestive heart failure) ([place])   . Constipation   . COPD (chronic obstructive pulmonary disease) ([facility])    pt.unsure of diagnosis status  . Emphysema of lung ([place])    pt. questions diagnosis  . Family history of adverse reaction to anesthesia    My Sister has nausea  . Family history of breast cancer   . Family history of colon cancer   . Family history of lung cancer   . GERD (gastroesophageal reflux disease)    not presently having symptoms  . Hypertension   . Lung cancer ([facility]) 06/06/05   stage 1 poorly differentiated adenocarcinoma, s/p right lower lobectomy.  . Osteoporosis   . Personal history of lung cancer   . Pneumonia    2007ish, walking pnemonia - 2009 ish  . STD (sexually transmitted disease)    HSV    Social History   Tobacco Use  . Smoking status: Former Smoker    Packs/day: 2.00    Years: 18.00    Pack years: 36.00    Quit date: 07/12/1987    Years since quitting: 32.6  . Smokeless tobacco:  Never Used  Vaping Use  . Vaping Use: Never used  Substance Use Topics  . Alcohol use: Yes    Alcohol/week: 14.0 standard drinks    Types: 7 Glasses of wine, 7 Shots of liquor per week    Comment: social  . Drug use: No    Family History  Problem Relation Age of Onset  . Allergies Mother   . Asthma Mother   . Lung cancer Mother   . Breast cancer Mother 10       recurrence age 70  . Colon cancer Father 18  . Prostate cancer Brother 57  . Breast cancer Sister 9       Recurrence age 68 BRCA negative  . Colon polyps Sister   . Leukemia Sister   . Breast cancer Sister 65  . Prostate cancer Brother 28  . Breast cancer Maternal Grandmother 57  . Colon cancer Maternal Aunt   . Leukemia Maternal Grandfather   . Lung cancer Maternal Aunt   . Breast cancer Cousin   . Esophageal cancer Neg Hx   . Rectal cancer Neg Hx   . Stomach cancer Neg Hx    Allergies  Allergen Reactions  . Taxol [Paclitaxel] Anaphylaxis  . Tape Itching  . Abraxane [Paclitaxel Protein-Bound Part] Rash  . Clarithromycin Rash    OBJECTIVE: Vitals:   02/10/20 0852  BP: (!) 97/62  Pulse: (!) 111  Temp: 98.3 F (36.8 C)  TempSrc: Oral  Weight: 100 lb (45.4 kg)  Height: '5\' 6"'  (1.676 m)   Body mass index is 16.14 kg/m.   Physical Exam Constitutional:      Comments: She is pleasant and in no distress.  She is very thin.  Cardiovascular:     Rate and Rhythm: Normal rate and regular rhythm.     Heart sounds: No murmur heard.   Pulmonary:     Effort: Pulmonary effort is normal.     Breath sounds: Wheezing present. No rhonchi or rales.  Abdominal:  Palpations: Abdomen is soft.     Tenderness: There is no abdominal tenderness.  Psychiatric:        Mood and Affect: Mood normal.     Microbiology: No results found for this or any previous visit (from the past 240 hour(s)).  Michel Bickers, MD [facility] [phone] pager   [phone]  cell 02/11/2020, 2:09 PM

## 2020-02-11 ENCOUNTER — Telehealth: Payer: Self-pay

## 2020-02-11 DIAGNOSIS — A310 Pulmonary mycobacterial infection: Secondary | ICD-10-CM | POA: Insufficient documentation

## 2020-02-11 MED ORDER — RIFABUTIN 150 MG PO CAPS: 300.0000 mg | ORAL_CAPSULE | Freq: Every day | ORAL | 11 refills | Status: DC

## 2020-02-11 NOTE — Telephone Encounter (Signed)
Patient called to confirm medication prescriptions. Per office notes, patient is going to be switched to daily antibiotic regimen however refills sent to pharmacy still reflect 3x per week dosing. Rifabutin refill sent however per patient, copay is going to be almost $500 per month. Change was made due to interaction with tamoxifen; patient curious if she should reach out to her oncologist for possible change due to cost associated with new regimen. Forwarding to provider for guidance on prescriptions and pharmacy for possible financial assistance options.   Brendy Ficek Lorita Officer, RN

## 2020-02-11 NOTE — Telephone Encounter (Signed)
Dr. Lamonte Sakai, please see mychart messages from pt and advise.

## 2020-02-11 NOTE — Telephone Encounter (Signed)
I think it is most likely that her symptoms are related to the infection and her body fighting off the infection.  I've never heard of these antibiotics causing SOB, although they can certainly make you feel fatigued or weak. Given the infection she is dealing with, if her breathing continues to be difficult then I think we should repeat her CXR to insure stability.

## 2020-02-11 NOTE — Telephone Encounter (Signed)
-----   Message from Darletta Moll, [place] sent at 02/11/2020 11:53 AM EDT ----- Regarding: FW: Drug interaction I am out this afternoon for an appt.. I included Velena Keegan on this message in case I am out! ----- Message ----- From: Darletta Moll, RPH-CPP Sent: 02/11/2020  11:49 AM EDT To: Michel Bickers, MD Subject: Drug interaction                               Hildred Priest - I was talking to patient regarding her medications and noticed that her rifampin is contraindicated with her tamoxifen therapy. Rifampin can decrease serum concentrations of her tamoxifen...   Rifabutin doesn't carry the same risk. There is just a "monitor therapy" recommendation. May consider switching?

## 2020-02-12 ENCOUNTER — Other Ambulatory Visit: Payer: Self-pay | Admitting: Pharmacist

## 2020-02-12 DIAGNOSIS — A310 Pulmonary mycobacterial infection: Secondary | ICD-10-CM

## 2020-02-12 MED ORDER — RIFABUTIN 150 MG PO CAPS: 300.0000 mg | ORAL_CAPSULE | Freq: Every day | ORAL | 11 refills | Status: DC

## 2020-02-12 MED ORDER — ETHAMBUTOL HCL 100 MG PO TABS: 200.0000 mg | ORAL_TABLET | Freq: Every day | ORAL | 11 refills | Status: DC

## 2020-02-12 MED ORDER — AZITHROMYCIN 500 MG PO TABS: 500.0000 mg | ORAL_TABLET | Freq: Every day | ORAL | 11 refills | Status: DC

## 2020-02-12 MED ORDER — ETHAMBUTOL HCL 400 MG PO TABS: 400.0000 mg | ORAL_TABLET | Freq: Every day | ORAL | 11 refills | Status: DC

## 2020-02-12 NOTE — Progress Notes (Signed)
Resending in MAC medications to reflect daily dosing.

## 2020-02-12 NOTE — Telephone Encounter (Signed)
Looking into copay options for her! Will update once I investigate.

## 2020-02-12 NOTE — Telephone Encounter (Signed)
Much thanks for your help.

## 2020-02-12 NOTE — Telephone Encounter (Signed)
Called patient to discuss and get income information for copay assistance with rifabutin. She is wondering if there are any other estrogen blocking agents that she could be on that do no interact with rifampin. I asked that she call her oncology office to discuss with them and get options and she will call me back so I can check for interactions. She has my direct number. If not, we will proceed with trying to get copay assistance.

## 2020-02-13 ENCOUNTER — Inpatient Hospital Stay: Payer: Medicare Other | Attending: Oncology

## 2020-02-13 ENCOUNTER — Other Ambulatory Visit: Payer: Self-pay

## 2020-02-13 VITALS — BP 88/55 | HR 89 | Temp 97.7°F | Resp 16

## 2020-02-13 DIAGNOSIS — Z7289 Other problems related to lifestyle: Secondary | ICD-10-CM | POA: Insufficient documentation

## 2020-02-13 DIAGNOSIS — Z8371 Family history of colonic polyps: Secondary | ICD-10-CM | POA: Insufficient documentation

## 2020-02-13 DIAGNOSIS — Z79899 Other long term (current) drug therapy: Secondary | ICD-10-CM | POA: Insufficient documentation

## 2020-02-13 DIAGNOSIS — Z800 Family history of malignant neoplasm of digestive organs: Secondary | ICD-10-CM | POA: Diagnosis not present

## 2020-02-13 DIAGNOSIS — Z170 Estrogen receptor positive status [ER+]: Secondary | ICD-10-CM | POA: Insufficient documentation

## 2020-02-13 DIAGNOSIS — Z801 Family history of malignant neoplasm of trachea, bronchus and lung: Secondary | ICD-10-CM | POA: Diagnosis not present

## 2020-02-13 DIAGNOSIS — Z95828 Presence of other vascular implants and grafts: Secondary | ICD-10-CM

## 2020-02-13 DIAGNOSIS — Z87891 Personal history of nicotine dependence: Secondary | ICD-10-CM | POA: Diagnosis not present

## 2020-02-13 DIAGNOSIS — Z806 Family history of leukemia: Secondary | ICD-10-CM | POA: Insufficient documentation

## 2020-02-13 DIAGNOSIS — Z853 Personal history of malignant neoplasm of breast: Secondary | ICD-10-CM | POA: Insufficient documentation

## 2020-02-13 DIAGNOSIS — I509 Heart failure, unspecified: Secondary | ICD-10-CM | POA: Diagnosis not present

## 2020-02-13 DIAGNOSIS — C50411 Malignant neoplasm of upper-outer quadrant of right female breast: Secondary | ICD-10-CM | POA: Diagnosis not present

## 2020-02-13 DIAGNOSIS — Z85118 Personal history of other malignant neoplasm of bronchus and lung: Secondary | ICD-10-CM | POA: Diagnosis not present

## 2020-02-13 DIAGNOSIS — Z803 Family history of malignant neoplasm of breast: Secondary | ICD-10-CM | POA: Diagnosis not present

## 2020-02-13 DIAGNOSIS — Z8042 Family history of malignant neoplasm of prostate: Secondary | ICD-10-CM | POA: Insufficient documentation

## 2020-02-13 DIAGNOSIS — Z836 Family history of other diseases of the respiratory system: Secondary | ICD-10-CM | POA: Insufficient documentation

## 2020-02-13 MED ORDER — SODIUM CHLORIDE 0.9 % IV SOLN
510.0000 mg | Freq: Once | INTRAVENOUS | Status: AC
  Administered 2020-02-13: 510 mg via INTRAVENOUS
  Filled 2020-02-13: qty 510

## 2020-02-13 MED ORDER — SODIUM CHLORIDE 0.9 % IV SOLN
INTRAVENOUS | Status: DC
  Filled 2020-02-13: qty 250

## 2020-02-13 NOTE — Patient Instructions (Signed)

## 2020-02-14 ENCOUNTER — Other Ambulatory Visit: Payer: Self-pay

## 2020-02-14 ENCOUNTER — Encounter: Payer: Self-pay | Admitting: Pharmacist

## 2020-02-14 ENCOUNTER — Other Ambulatory Visit: Payer: Self-pay | Admitting: Pharmacist

## 2020-02-14 DIAGNOSIS — R0602 Shortness of breath: Secondary | ICD-10-CM

## 2020-02-14 DIAGNOSIS — A310 Pulmonary mycobacterial infection: Secondary | ICD-10-CM

## 2020-02-14 MED ORDER — RIFAMPIN 300 MG PO CAPS: 600.0000 mg | ORAL_CAPSULE | Freq: Every day | ORAL | 11 refills | Status: DC

## 2020-02-14 NOTE — Telephone Encounter (Signed)
Ok sounds good with me if he is comfortable with it. Will cancel rifabutin Rx and send in rifampin. Thanks Dorie!

## 2020-02-14 NOTE — Telephone Encounter (Signed)
Patient spoke with oncologist who stated he is comfortable with her taking the Rifampin in the am and her tamoxifen at night. Other oncology med options follow similar pathways as tamoxifen, per MD and he feels that this plan is ok. Forwarding to pharmacist to place new prescription for rifampin, if possible.  Thanks! Myquan Schaumburg Lorita Officer, RN

## 2020-02-17 ENCOUNTER — Encounter (HOSPITAL_BASED_OUTPATIENT_CLINIC_OR_DEPARTMENT_OTHER): Payer: Self-pay | Admitting: *Deleted

## 2020-02-17 ENCOUNTER — Telehealth: Payer: Self-pay | Admitting: Internal Medicine

## 2020-02-17 ENCOUNTER — Telehealth: Payer: Self-pay | Admitting: Emergency Medicine

## 2020-02-17 ENCOUNTER — Emergency Department (HOSPITAL_BASED_OUTPATIENT_CLINIC_OR_DEPARTMENT_OTHER)
Admission: EM | Admit: 2020-02-17 | Discharge: 2020-02-17 | Disposition: A | Discharge status: Home / Self Care | Payer: Medicare Other | Source: Home / Self Care | Attending: Emergency Medicine | Admitting: Emergency Medicine

## 2020-02-17 ENCOUNTER — Other Ambulatory Visit: Payer: Self-pay

## 2020-02-17 DIAGNOSIS — R110 Nausea: Secondary | ICD-10-CM | POA: Diagnosis not present

## 2020-02-17 DIAGNOSIS — J449 Chronic obstructive pulmonary disease, unspecified: Secondary | ICD-10-CM | POA: Insufficient documentation

## 2020-02-17 DIAGNOSIS — E871 Hypo-osmolality and hyponatremia: Secondary | ICD-10-CM

## 2020-02-17 DIAGNOSIS — R112 Nausea with vomiting, unspecified: Secondary | ICD-10-CM | POA: Insufficient documentation

## 2020-02-17 DIAGNOSIS — J440 Chronic obstructive pulmonary disease with acute lower respiratory infection: Secondary | ICD-10-CM | POA: Diagnosis not present

## 2020-02-17 DIAGNOSIS — R5383 Other fatigue: Secondary | ICD-10-CM | POA: Insufficient documentation

## 2020-02-17 DIAGNOSIS — I509 Heart failure, unspecified: Secondary | ICD-10-CM | POA: Insufficient documentation

## 2020-02-17 DIAGNOSIS — Z87891 Personal history of nicotine dependence: Secondary | ICD-10-CM | POA: Insufficient documentation

## 2020-02-17 DIAGNOSIS — Z20822 Contact with and (suspected) exposure to covid-19: Secondary | ICD-10-CM | POA: Diagnosis not present

## 2020-02-17 DIAGNOSIS — M545 Low back pain: Secondary | ICD-10-CM | POA: Diagnosis not present

## 2020-02-17 DIAGNOSIS — R079 Chest pain, unspecified: Secondary | ICD-10-CM | POA: Diagnosis not present

## 2020-02-17 DIAGNOSIS — G9341 Metabolic encephalopathy: Secondary | ICD-10-CM | POA: Diagnosis not present

## 2020-02-17 DIAGNOSIS — I5032 Chronic diastolic (congestive) heart failure: Secondary | ICD-10-CM | POA: Diagnosis not present

## 2020-02-17 DIAGNOSIS — I110 Hypertensive heart disease with heart failure: Secondary | ICD-10-CM | POA: Insufficient documentation

## 2020-02-17 DIAGNOSIS — Z79899 Other long term (current) drug therapy: Secondary | ICD-10-CM | POA: Insufficient documentation

## 2020-02-17 DIAGNOSIS — A310 Pulmonary mycobacterial infection: Secondary | ICD-10-CM | POA: Diagnosis not present

## 2020-02-17 LAB — CBC WITH DIFFERENTIAL/PLATELET
Abs Immature Granulocytes: 0.16 10*3/uL — ABNORMAL HIGH (ref 0.00–0.07)
Basophils Absolute: 0 10*3/uL (ref 0.0–0.1)
Basophils Relative: 0 %
Eosinophils Absolute: 0.1 10*3/uL (ref 0.0–0.5)
Eosinophils Relative: 1 %
HCT: 33.3 % — ABNORMAL LOW (ref 36.0–46.0)
Hemoglobin: 10.4 g/dL — ABNORMAL LOW (ref 12.0–15.0)
Immature Granulocytes: 2 %
Lymphocytes Relative: 5 %
Lymphs Abs: 0.5 10*3/uL — ABNORMAL LOW (ref 0.7–4.0)
MCH: 25.5 pg — ABNORMAL LOW (ref 26.0–34.0)
MCHC: 31.2 g/dL (ref 30.0–36.0)
MCV: 81.6 fL (ref 80.0–100.0)
Monocytes Absolute: 0.7 10*3/uL (ref 0.1–1.0)
Monocytes Relative: 6 %
Neutro Abs: 9.3 10*3/uL — ABNORMAL HIGH (ref 1.7–7.7)
Neutrophils Relative %: 86 %
Platelets: 370 10*3/uL (ref 150–400)
RBC: 4.08 MIL/uL (ref 3.87–5.11)
RDW: 16.6 % — ABNORMAL HIGH (ref 11.5–15.5)
WBC: 10.7 10*3/uL — ABNORMAL HIGH (ref 4.0–10.5)
nRBC: 0 % (ref 0.0–0.2)

## 2020-02-17 LAB — COMPREHENSIVE METABOLIC PANEL
ALT: 57 U/L — ABNORMAL HIGH (ref 0–44)
AST: 55 U/L — ABNORMAL HIGH (ref 15–41)
Albumin: 2.9 g/dL — ABNORMAL LOW (ref 3.5–5.0)
Alkaline Phosphatase: 113 U/L (ref 38–126)
Anion gap: 13 (ref 5–15)
BUN: 17 mg/dL (ref 8–23)
CO2: 23 mmol/L (ref 22–32)
Calcium: 9 mg/dL (ref 8.9–10.3)
Chloride: 90 mmol/L — ABNORMAL LOW (ref 98–111)
Creatinine, Ser: 0.57 mg/dL (ref 0.44–1.00)
GFR calc Af Amer: >60 mL/min (ref >60)
GFR calc non Af Amer: >60 mL/min (ref >60)
Glucose, Bld: 108 mg/dL — ABNORMAL HIGH (ref 70–99)
Potassium: 3.8 mmol/L (ref 3.5–5.1)
Sodium: 126 mmol/L — ABNORMAL LOW (ref 135–145)
Total Bilirubin: 1 mg/dL (ref 0.3–1.2)
Total Protein: 7.7 g/dL (ref 6.5–8.1)

## 2020-02-17 MED ORDER — SODIUM CHLORIDE 0.9 % IV BOLUS
1000.0000 mL | Freq: Once | INTRAVENOUS | Status: AC
  Administered 2020-02-17: 1000 mL via INTRAVENOUS

## 2020-02-17 MED ORDER — METOCLOPRAMIDE HCL 5 MG/ML IJ SOLN
10.0000 mg | Freq: Once | INTRAMUSCULAR | Status: AC
  Administered 2020-02-17: 10 mg via INTRAVENOUS
  Filled 2020-02-17: qty 2

## 2020-02-17 NOTE — ED Provider Notes (Signed)
[facility] EMERGENCY DEPARTMENT Provider Note   CSN: [id number] Arrival date & time: 02/17/20  1611     History Chief Complaint  Patient presents with  . Nausea    Anna Valentine is a 69 y.o. female.  Presents to ER with concern for nausea.  Patient reports that she has been having the symptoms for the past couple weeks.  Seems to be worsening over the last few days.  States she has had decreased appetite, concern for possible weight loss recently.  She has been diagnosed with Mycobacterium avium and is currently on azithromycin, ethambutol, rifampin.  She has occasional episodes of vomiting, nonbloody nonbilious.  She has not had any abdominal pain, no fever.  Came today to be evaluated for concern for dehydration. HPI     Past Medical History:  Diagnosis Date  . Anemia   . BRCA negative 2011   BRCA I/ II negative  . Breast cancer ([place]) 08/2009   stage 2, rx with lumpectomy and xrt  . CHF (congestive heart failure) ([place])   . Constipation   . COPD (chronic obstructive pulmonary disease) ([facility])    pt.unsure of diagnosis status  . Emphysema of lung ([place])    pt. questions diagnosis  . Family history of adverse reaction to anesthesia    My Sister has nausea  . Family history of breast cancer   . Family history of colon cancer   . Family history of lung cancer   . GERD (gastroesophageal reflux disease)    not presently having symptoms  . Hypertension   . Lung cancer ([place]) 06/06/05   stage 1 poorly differentiated adenocarcinoma, s/p right lower lobectomy.  . Osteoporosis   . Personal history of lung cancer   . Pneumonia    2007ish, walking pnemonia - 2009 ish  . STD (sexually transmitted disease)    HSV    Patient Active Problem List   Diagnosis Date Noted  . Mycobacterium avium infection ([place]) 02/11/2020  . Right epiretinal membrane 01/30/2020  . 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 Nuclear sclerotic cataract of right eye 01/30/2020  . Nuclear sclerotic  cataract of left eye 01/30/2020  . Iron deficiency anemia 01/20/2020  . Malignant neoplasm of overlapping sites of right breast in female, estrogen receptor positive ([place]) 09/20/2019  . Weight loss, non-intentional 09/20/2019  . Iatrogenic pneumothorax 02/06/2019  . Postprocedural pneumothorax   . 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 Port-A-Cath in place 04/26/2018  . Genetic testing 02/20/2018  . Family history of breast cancer   . Family history of lung cancer   . Personal history of lung cancer   . Chest pain 11/28/2017  . Abnormal CT of the chest 11/30/2016  . Malignant neoplasm of upper-inner quadrant of left breast in female, estrogen receptor positive ([place]) 06/16/2014  . Osteopenia 09/13/2013  . Postmenopausal atrophic vaginitis 09/13/2013  . Vitamin D deficiency 09/13/2013  . Malignant neoplasm of lower lobe of right lung ([place]) 07/24/2012  . History of lung cancer 03/11/2011  . Anxiety 03/11/2011  . History of neutropenia 03/11/2011  . Family history of colon cancer 03/11/2011  . Cough 11/15/2010  . Bronchiectasis without acute exacerbation ([place]) 11/15/2010    Past Surgical History:  Procedure Laterality Date  . BREAST BIOPSY    . BREAST LUMPECTOMY  10/12/2009   Left lumpectomy and radiation, stage II, ER/PR+, Her 2 nu negative  . BUNIONECTOMY Bilateral   . COLONOSCOPY    . COLONOSCOPY    . LOBECTOMY Right 06/06/2005  [place]  . MASTECTOMY W/ SENTINEL NODE BIOPSY Bilateral 03/15/2018  . MASTECTOMY W/ SENTINEL NODE BIOPSY Bilateral 03/15/2018   Procedure: BILATERAL TOTAL MASTECTOMIES WITH RIGHT SENTINEL LYMPH NODE BIOPSY;  Surgeon: Rolm Bookbinder, MD;  Location: [facility];  Service: General;  Laterality: Bilateral;  . PORTACATH PLACEMENT N/A 03/15/2018   Procedure: INSERTION PORT-A-CATH WITH [place];  Surgeon: Rolm Bookbinder, MD;  Location: [facility];  Service: General;  Laterality: N/A;  . TONSILLECTOMY    . TUBAL LIGATION  1984  . VIDEO BRONCHOSCOPY  02/06/2019   Flexible  video fiberoptic bronchoscopy with electromagnetic navigation and biopsies.  Marland Kitchen VIDEO BRONCHOSCOPY WITH ENDOBRONCHIAL NAVIGATION Left 02/06/2019   Procedure: VIDEO BRONCHOSCOPY WITH ENDOBRONCHIAL NAVIGATION, left lung;  Surgeon: Collene Gobble, MD;  Location: [facility];  Service: Thoracic;  Laterality: Left;  Marland Kitchen VIDEO BRONCHOSCOPY WITH ENDOBRONCHIAL NAVIGATION N/A 01/08/2020   Procedure: VIDEO BRONCHOSCOPY WITH ENDOBRONCHIAL NAVIGATION;  Surgeon: Collene Gobble, MD;  Location: [facility];  Service: Thoracic;  Laterality: N/A;     OB History    Gravida  2   Para  0   Term  0   Preterm  0   AB  2   Living        SAB      TAB      Ectopic      Multiple      Live Births              Family History  Problem Relation Age of Onset  . Allergies Mother   . Asthma Mother   . Lung cancer Mother   . Breast cancer Mother 7       recurrence age 39  . Colon cancer Father 15  . Prostate cancer Brother 37  . Breast cancer Sister 45       Recurrence age 96 BRCA negative  . Colon polyps Sister   . Leukemia Sister   . Breast cancer Sister 74  . Prostate cancer Brother 22  . Breast cancer Maternal Grandmother 58  . Colon cancer Maternal Aunt   . Leukemia Maternal Grandfather   . Lung cancer Maternal Aunt   . Breast cancer Cousin   . Esophageal cancer Neg Hx   . Rectal cancer Neg Hx   . Stomach cancer Neg Hx     Social History   Tobacco Use  . Smoking status: Former Smoker    Packs/day: 2.00    Years: 18.00    Pack years: 36.00    Quit date: 07/12/1987    Years since quitting: 32.6  . Smokeless tobacco: Never Used  Vaping Use  . Vaping Use: Never used  Substance Use Topics  . Alcohol use: Yes    Alcohol/week: 14.0 standard drinks    Types: 7 Glasses of wine, 7 Shots of liquor per week    Comment: social  . Drug use: No    Home Medications Prior to Admission medications   Medication Sig Start Date End Date Taking? Authorizing Provider  acyclovir (ZOVIRAX) 400 MG  tablet Take 1 tablet (400 mg total) by mouth 2 (two) times daily. 01/08/20   Collene Gobble, MD  albuterol (VENTOLIN HFA) 108 (90 Base) MCG/ACT inhaler Inhale 2 puffs into the lungs every 6 (six) hours as needed for wheezing or shortness of breath. 12/02/19   Baxley, Cresenciano Lick, MD  azithromycin (ZITHROMAX) 500 MG tablet Take 1 tablet (500 mg total) by mouth daily. 02/12/20   Kuppelweiser, Cassie L, RPH-CPP  calcium-vitamin  D (OSCAL WITH D) 500-200 MG-UNIT tablet Take 1 tablet by mouth daily. Patient not taking: Reported on 02/10/2020    [provider]  cholecalciferol (VITAMIN D) 1000 UNITS tablet Take 1,000 Units by mouth daily. Patient not taking: Reported on 02/10/2020    [provider]  ethambutol (MYAMBUTOL) 100 MG tablet Take 2 tablets (200 mg total) by mouth daily. Take with one 400 mg tablet to equal 600 mg total. 02/12/20   Kuppelweiser, Cassie L, RPH-CPP  ethambutol (MYAMBUTOL) 400 MG tablet Take 1 tablet (400 mg total) by mouth daily. Take with two 100 mg tablets to equal 600 mg total. 02/12/20   Kuppelweiser, Cassie L, RPH-CPP  Ferrous Sulfate (IRON) 28 MG TABS Take by mouth.     [provider]  folic acid (FOLVITE) 1 MG tablet Take 1 tablet (1 mg total) by mouth daily. 02/05/20   Magrinat, Virgie Dad, MD  gabapentin (NEURONTIN) 300 MG capsule Take 1 capsule (300 mg total) by mouth at bedtime. 02/05/20   Magrinat, Virgie Dad, MD  ibuprofen (ADVIL) 200 MG tablet Take 200 mg by mouth every 6 (six) hours as needed.     [provider]  losartan (COZAAR) 25 MG tablet Take 1 tablet (25 mg total) by mouth 2 (two) times daily. Patient not taking: Reported on 02/06/2020 08/19/19   Larey Dresser, MD  mirtazapine (REMERON) 7.5 MG tablet Take 1 tablet (7.5 mg total) by mouth at bedtime. 02/05/20   Magrinat, Virgie Dad, MD  ondansetron (ZOFRAN) 4 MG tablet Take 1 tablet (4 mg total) by mouth every 8 (eight) hours as needed for nausea or vomiting. 01/28/20   Baxley, Cresenciano Lick, MD    prochlorperazine (COMPAZINE) 5 MG tablet Take 2 tablets (10 mg total) by mouth every 6 (six) hours as needed for nausea or vomiting. 02/05/20   Magrinat, Virgie Dad, MD  Psyllium (METAMUCIL PO) Take 1 Scoop by mouth daily as needed (constipation).      [provider]  rifampin (RIFADIN) 300 MG capsule Take 2 capsules (600 mg total) by mouth daily. 02/14/20   Kuppelweiser, Cassie L, RPH-CPP  tamoxifen (NOLVADEX) 20 MG tablet Take 20 mg by mouth daily. 02/05/20   [provider]  vitamin B-12 (CYANOCOBALAMIN) 500 MCG tablet Take 500 mcg by mouth daily.     [provider]  vitamin C (ASCORBIC ACID) 500 MG tablet Take 500 mg by mouth daily. Patient not taking: Reported on 02/10/2020    [provider]    Allergies    Taxol [paclitaxel], Tape, Abraxane [paclitaxel protein-bound part], and Clarithromycin  Review of Systems   Review of Systems  Constitutional: Positive for fatigue. Negative for chills and fever.  HENT: Negative for ear pain and sore throat.   Eyes: Negative for pain and visual disturbance.  Respiratory: Negative for cough and shortness of breath.   Cardiovascular: Negative for chest pain and palpitations.  Gastrointestinal: Positive for nausea and vomiting. Negative for abdominal pain.  Genitourinary: Negative for dysuria and hematuria.  Musculoskeletal: Negative for arthralgias and back pain.  Skin: Negative for color change and rash.  Neurological: Negative for seizures and syncope.  All other systems reviewed and are negative.   Physical Exam Updated Vital Signs BP 123/84 (BP Location: Left Arm)   Pulse 90   Temp 98.4 F (36.9 C) (Oral)   Resp 20   Ht '5\' 6"'  (1.676 m)   Wt 45.4 kg   SpO2 97%   BMI 16.15 kg/m  Physical Exam Vitals and nursing note reviewed.  Constitutional:      General: She is not in acute distress.    Appearance: She is well-developed.     Comments: Somewhat frail  HENT:     Head: Normocephalic and  atraumatic.  Eyes:     Conjunctiva/sclera: Conjunctivae normal.  Cardiovascular:     Rate and Rhythm: Normal rate and regular rhythm.     Heart sounds: No murmur heard.   Pulmonary:     Effort: Pulmonary effort is normal. No respiratory distress.     Breath sounds: Normal breath sounds.  Abdominal:     Palpations: Abdomen is soft.     Tenderness: There is no abdominal tenderness.  Musculoskeletal:     Cervical back: Neck supple.  Skin:    General: Skin is warm and dry.     Capillary Refill: Capillary refill takes less than 2 seconds.  Neurological:     General: No focal deficit present.     Mental Status: She is alert and oriented to person, place, and time.  Psychiatric:        Mood and Affect: Mood normal.        Behavior: Behavior normal.     ED Results / Procedures / Treatments   Labs (all labs ordered are listed, but only abnormal results are displayed) Labs Reviewed  CBC WITH DIFFERENTIAL/PLATELET - Abnormal; Notable for the following components:      Result Value   WBC 10.7 (*)    Hemoglobin 10.4 (*)    HCT 33.3 (*)    MCH 25.5 (*)    RDW 16.6 (*)    Neutro Abs 9.3 (*)    Lymphs Abs 0.5 (*)    Abs Immature Granulocytes 0.16 (*)    All other components within normal limits  COMPREHENSIVE METABOLIC PANEL - Abnormal; Notable for the following components:   Sodium 126 (*)    Chloride 90 (*)    Glucose, Bld 108 (*)    Albumin 2.9 (*)    AST 55 (*)    ALT 57 (*)    All other components within normal limits  URINALYSIS, ROUTINE W REFLEX MICROSCOPIC    EKG None  Radiology No results found.  Procedures Procedures (including critical care time)  Medications Ordered in ED Medications  sodium chloride 0.9 % bolus 1,000 mL (0 mLs Intravenous Stopped 02/17/20 2201)  metoCLOPramide (REGLAN) injection 10 mg (10 mg Intravenous Given 02/17/20 2053)    ED Course  I have reviewed the triage vital signs and the nursing notes.  Pertinent labs & imaging results that  were available during my care of the patient were reviewed by me and considered in my medical decision making (see chart for details).    MDM Rules/Calculators/A&P                         69 year old lady presents to ER with concern for nausea.  Currently undergoing therapy for Mycobacterium avium.  Followed closely by Dr. Lamonte Sakai with pulmonology.  In ER, patient looks frail and chronically ill-appearing but she is in no acute distress.  Abdomen soft.  Lab work was concerning for hyponatremia but labs otherwise grossly stable.  Given soft abdominal exam, no pain, no fever, low suspicion for acute abdominal process.  Per Dr. Agustina Caroli note, symptoms due to either underlying disease versus medication side effect and he request patient continue therapy if at all possible.  Provided patient with fluids, antiemetics.  Her symptoms greatly improved and she was tolerating p.o. without difficulty.  Offered admission for further observation, rehydration and monitoring or discharge with close outpatient follow-up.  Patient interested and agreeable for discharge at this time.  I stressed need for close outpatient follow-up, need for sodium to be monitored.  And stressed need to return should she have worsening of her symptoms.   After the discussed management above, the patient was determined to be safe for discharge.  The patient was in agreement with this plan and all questions regarding their care were answered.  ED return precautions were discussed and the patient will return to the ED with any significant worsening of condition.  Final Clinical Impression(s) / ED Diagnoses Final diagnoses:  Nausea  Hyponatremia    Rx / DC Orders ED Discharge Orders    None       Lucrezia Starch, MD 02/18/20 [phone]

## 2020-02-17 NOTE — Telephone Encounter (Signed)
Her nausea may be related to either her underlying infection or to the new antibiotic regimen.  I like for her to try to stay on the antibiotics if possible.  Please give her prescription for Zofran 4 mg up to every 6 hours as needed for nausea control, disp #45.  Have her call us and let us know if this is helpful.

## 2020-02-17 NOTE — Telephone Encounter (Addendum)
Andre Gallego [phone]  Anna Valentine called to say she has lost 10 lbs in 2 weeks, she is has nausea, vomiting, and shortness of breath, she has to rest before she can do anything. She also runs a fever at times of 100.5 she stated this has been going on since May. She is afraid she is dehydrated. No COVID exposure that she knows of. COVID vaccines Feb, and March.

## 2020-02-17 NOTE — Telephone Encounter (Signed)
Ok thank you for letting me know. Unfortunately this could be due to the underlying disease. She likely does need IVF

## 2020-02-17 NOTE — Telephone Encounter (Signed)
Spoke with patient, she states she has been having nausea for 1 1/2-2 weeks.  She has been vomiting 1-2 times a day, mostly bile.  She states she was put on 3 antibiotics and was referred to Dr. Megan Salon who put her on the antibiotics daily.  She saw Dr. Megan Salon last Monday, but is unsure if she mentioned the nausea to him.  She states she was having the nausea prior to seeing Dr. Megan Salon.  She says she is still having the sob and is extremely weak.  I advised her that I would send this to Dr. Lamonte Sakai as he is in the office today.  I let her know that he does not have any appointments any sooner than her 8/26 appointment.  Dr. Lamonte Sakai, please advise.  Thank you.

## 2020-02-17 NOTE — Telephone Encounter (Signed)
Just received a call from patient's good friend, Anna Valentine who is a patient here but is not on patient's DPR expressing concern for patient being possibly volume depleted with lack of appetite and weight loss.Explained to Anna Valentine that I could not discuss Anna Valentine's condition with her as she is not on Anna Valentine's DPR. Advised evaluation in ED.

## 2020-02-17 NOTE — ED Triage Notes (Signed)
Nausea for 2 weeks. She feels dehydrated. No vomiting.

## 2020-02-17 NOTE — Telephone Encounter (Signed)
She must call Dr. Lamonte Sakai back. This is likely related to her lung infection.

## 2020-02-17 NOTE — Telephone Encounter (Signed)
Called and relayed Dr Lauree Chandler message to patient and she verbalized understanding.

## 2020-02-17 NOTE — ED Notes (Signed)
Pt aware of need for urine specimen, unable to provide at this time. 

## 2020-02-17 NOTE — Telephone Encounter (Signed)
Spoke with the pt  She states already taking zofran and compazine  She feels these do not help  She is in the ED now b/c she feels like she may be dehydrated  Will forward to Dr Lamonte Sakai to be aware

## 2020-02-17 NOTE — Discharge Instructions (Addendum)
Continue your previously prescribed Compazine and Zofran as needed for nausea.  Please contact your primary doctor's office or Dr. Agustina Caroli office tomorrow morning to get a close follow-up appointment, ideally to be seen within the next 24 to 48 hours.  Your sodium level today was low and this should be monitored closely, you should have a repeat labs performed within the next couple days.  If at any point you develop worsening nausea, vomiting, fever, other new concerning symptom, please return to the emergency room for reassessment.

## 2020-02-18 NOTE — Telephone Encounter (Signed)
Patrice could you please open a 8:30am slot per RB for this pt on Thursday 02/20/20. Thank you

## 2020-02-18 NOTE — Telephone Encounter (Signed)
Patient requesting follow up Bmet and CXR. Recent ED visit for nausea and vomiting (dehydration). Sodium was 126. I see no mention of needing a follow up X-ray. Please advise

## 2020-02-18 NOTE — Telephone Encounter (Signed)
She needs to be set up this week with RB or APP for OV, check BMP and CXR.

## 2020-02-19 ENCOUNTER — Encounter: Payer: Self-pay | Admitting: Internal Medicine

## 2020-02-19 ENCOUNTER — Telehealth (INDEPENDENT_AMBULATORY_CARE_PROVIDER_SITE_OTHER): Payer: Medicare Other | Admitting: Internal Medicine

## 2020-02-19 DIAGNOSIS — M545 Low back pain: Secondary | ICD-10-CM

## 2020-02-19 MED ORDER — HYDROCODONE-ACETAMINOPHEN 5-325 MG PO TABS: 1.0000 | ORAL_TABLET | Freq: Three times a day (TID) | ORAL | 0 refills | Status: AC | PRN

## 2020-02-19 NOTE — Telephone Encounter (Addendum)
I have called patient this am. She says she is having severe back pain. Not sure if it is related to recent ED visit. Not much appetite. Reminded her for need for nutrition and hydration.. Does not like Ensure. Asked about apple sauce. Needs protein in diet as well. Good friend, Mrs Charlestine Massed is helping with meals.  Patient would like something for back pain.  Patient thanked me for calling.  She is identified using 2 identifiers as Anna Valentine. [place], a longstanding patient in this practice.  She is agreeable to speaking with me in this format today.   Has appt with both me and Dr. Lamonte Sakai tomorrow. We can cancel our appt.  Husband who is deceased is still on [place]. We need an up to date DPR contact.  Patient needs to get this changed. Patient has sister in [place] who is recovering from shoulder surgery. Patient is afraid that her lung cancer has come back. Seems depressed.Asked her if she needed to return to ED for evaluation of back pain and she declines at this time.  Calling in [place] #15 one po q 8 hours as needed for severe back pain. Must take anti- nausea med before taking pain med.  Sister in [place] is Alger Simons [phone]  I am at my office and patient is at her home.  Time spent reviewing records and speaking with patient is 20 minutes.

## 2020-02-20 ENCOUNTER — Inpatient Hospital Stay (HOSPITAL_COMMUNITY)
Admission: EM | Admit: 2020-02-20 | Discharge: 2020-02-24 | DRG: 640 | Disposition: A | Discharge status: Home Health | Payer: Medicare Other | Attending: Internal Medicine | Admitting: Internal Medicine

## 2020-02-20 ENCOUNTER — Encounter (HOSPITAL_COMMUNITY): Payer: Self-pay

## 2020-02-20 ENCOUNTER — Ambulatory Visit (INDEPENDENT_AMBULATORY_CARE_PROVIDER_SITE_OTHER): Payer: Medicare Other

## 2020-02-20 ENCOUNTER — Inpatient Hospital Stay (HOSPITAL_COMMUNITY): Payer: Medicare Other

## 2020-02-20 ENCOUNTER — Encounter: Payer: Self-pay | Admitting: Emergency Medicine

## 2020-02-20 ENCOUNTER — Other Ambulatory Visit: Payer: Self-pay

## 2020-02-20 ENCOUNTER — Ambulatory Visit (INDEPENDENT_AMBULATORY_CARE_PROVIDER_SITE_OTHER): Payer: Medicare Other | Admitting: Emergency Medicine

## 2020-02-20 ENCOUNTER — Telehealth: Payer: Self-pay | Admitting: Internal Medicine

## 2020-02-20 ENCOUNTER — Telehealth: Payer: Self-pay | Admitting: Emergency Medicine

## 2020-02-20 ENCOUNTER — Inpatient Hospital Stay: Admission: AD | Admit: 2020-02-20 | Payer: Medicare Other | Source: Ambulatory Visit | Admitting: Emergency Medicine

## 2020-02-20 ENCOUNTER — Ambulatory Visit: Payer: Medicare Other | Admitting: Internal Medicine

## 2020-02-20 VITALS — BP 108/70 | HR 102 | Temp 97.3°F | Ht 66.0 in | Wt 99.6 lb

## 2020-02-20 DIAGNOSIS — Z902 Acquired absence of lung [part of]: Secondary | ICD-10-CM | POA: Diagnosis not present

## 2020-02-20 DIAGNOSIS — I10 Essential (primary) hypertension: Secondary | ICD-10-CM | POA: Diagnosis present

## 2020-02-20 DIAGNOSIS — C50811 Malignant neoplasm of overlapping sites of right female breast: Secondary | ICD-10-CM | POA: Diagnosis present

## 2020-02-20 DIAGNOSIS — M810 Age-related osteoporosis without current pathological fracture: Secondary | ICD-10-CM | POA: Diagnosis present

## 2020-02-20 DIAGNOSIS — E860 Dehydration: Secondary | ICD-10-CM | POA: Diagnosis present

## 2020-02-20 DIAGNOSIS — I110 Hypertensive heart disease with heart failure: Secondary | ICD-10-CM | POA: Diagnosis present

## 2020-02-20 DIAGNOSIS — D509 Iron deficiency anemia, unspecified: Secondary | ICD-10-CM | POA: Diagnosis present

## 2020-02-20 DIAGNOSIS — I5032 Chronic diastolic (congestive) heart failure: Secondary | ICD-10-CM | POA: Diagnosis present

## 2020-02-20 DIAGNOSIS — Z91048 Other nonmedicinal substance allergy status: Secondary | ICD-10-CM

## 2020-02-20 DIAGNOSIS — A310 Pulmonary mycobacterial infection: Secondary | ICD-10-CM

## 2020-02-20 DIAGNOSIS — J189 Pneumonia, unspecified organism: Secondary | ICD-10-CM | POA: Diagnosis not present

## 2020-02-20 DIAGNOSIS — Z85118 Personal history of other malignant neoplasm of bronchus and lung: Secondary | ICD-10-CM

## 2020-02-20 DIAGNOSIS — G9341 Metabolic encephalopathy: Secondary | ICD-10-CM | POA: Diagnosis present

## 2020-02-20 DIAGNOSIS — D638 Anemia in other chronic diseases classified elsewhere: Secondary | ICD-10-CM | POA: Diagnosis present

## 2020-02-20 DIAGNOSIS — D508 Other iron deficiency anemias: Secondary | ICD-10-CM

## 2020-02-20 DIAGNOSIS — Z8262 Family history of osteoporosis: Secondary | ICD-10-CM | POA: Diagnosis not present

## 2020-02-20 DIAGNOSIS — Z20822 Contact with and (suspected) exposure to covid-19: Secondary | ICD-10-CM | POA: Diagnosis present

## 2020-02-20 DIAGNOSIS — R079 Chest pain, unspecified: Secondary | ICD-10-CM | POA: Diagnosis not present

## 2020-02-20 DIAGNOSIS — E878 Other disorders of electrolyte and fluid balance, not elsewhere classified: Secondary | ICD-10-CM | POA: Diagnosis not present

## 2020-02-20 DIAGNOSIS — E871 Hypo-osmolality and hyponatremia: Secondary | ICD-10-CM | POA: Diagnosis present

## 2020-02-20 DIAGNOSIS — Z9013 Acquired absence of bilateral breasts and nipples: Secondary | ICD-10-CM | POA: Diagnosis not present

## 2020-02-20 DIAGNOSIS — Z8371 Family history of colonic polyps: Secondary | ICD-10-CM

## 2020-02-20 DIAGNOSIS — J440 Chronic obstructive pulmonary disease with acute lower respiratory infection: Secondary | ICD-10-CM | POA: Diagnosis present

## 2020-02-20 DIAGNOSIS — J479 Bronchiectasis, uncomplicated: Secondary | ICD-10-CM | POA: Diagnosis not present

## 2020-02-20 DIAGNOSIS — Z801 Family history of malignant neoplasm of trachea, bronchus and lung: Secondary | ICD-10-CM | POA: Diagnosis not present

## 2020-02-20 DIAGNOSIS — K219 Gastro-esophageal reflux disease without esophagitis: Secondary | ICD-10-CM | POA: Diagnosis present

## 2020-02-20 DIAGNOSIS — Z888 Allergy status to other drugs, medicaments and biological substances status: Secondary | ICD-10-CM

## 2020-02-20 DIAGNOSIS — Z803 Family history of malignant neoplasm of breast: Secondary | ICD-10-CM | POA: Diagnosis not present

## 2020-02-20 DIAGNOSIS — J984 Other disorders of lung: Secondary | ICD-10-CM | POA: Diagnosis not present

## 2020-02-20 DIAGNOSIS — R0602 Shortness of breath: Secondary | ICD-10-CM

## 2020-02-20 DIAGNOSIS — Z170 Estrogen receptor positive status [ER+]: Secondary | ICD-10-CM

## 2020-02-20 DIAGNOSIS — M545 Low back pain, unspecified: Secondary | ICD-10-CM | POA: Diagnosis present

## 2020-02-20 DIAGNOSIS — J439 Emphysema, unspecified: Secondary | ICD-10-CM | POA: Diagnosis not present

## 2020-02-20 DIAGNOSIS — R112 Nausea with vomiting, unspecified: Secondary | ICD-10-CM | POA: Diagnosis present

## 2020-02-20 DIAGNOSIS — Z681 Body mass index (BMI) 19 or less, adult: Secondary | ICD-10-CM | POA: Diagnosis not present

## 2020-02-20 DIAGNOSIS — Z87891 Personal history of nicotine dependence: Secondary | ICD-10-CM

## 2020-02-20 DIAGNOSIS — R64 Cachexia: Secondary | ICD-10-CM | POA: Diagnosis present

## 2020-02-20 DIAGNOSIS — I700 Atherosclerosis of aorta: Secondary | ICD-10-CM | POA: Diagnosis not present

## 2020-02-20 DIAGNOSIS — Z79899 Other long term (current) drug therapy: Secondary | ICD-10-CM

## 2020-02-20 DIAGNOSIS — E861 Hypovolemia: Secondary | ICD-10-CM | POA: Diagnosis present

## 2020-02-20 DIAGNOSIS — Z9851 Tubal ligation status: Secondary | ICD-10-CM

## 2020-02-20 DIAGNOSIS — R630 Anorexia: Secondary | ICD-10-CM | POA: Diagnosis not present

## 2020-02-20 DIAGNOSIS — J90 Pleural effusion, not elsewhere classified: Secondary | ICD-10-CM | POA: Diagnosis not present

## 2020-02-20 DIAGNOSIS — Z825 Family history of asthma and other chronic lower respiratory diseases: Secondary | ICD-10-CM

## 2020-02-20 DIAGNOSIS — R918 Other nonspecific abnormal finding of lung field: Secondary | ICD-10-CM | POA: Diagnosis not present

## 2020-02-20 DIAGNOSIS — I2510 Atherosclerotic heart disease of native coronary artery without angina pectoris: Secondary | ICD-10-CM | POA: Diagnosis not present

## 2020-02-20 HISTORY — DX: Bronchiectasis, uncomplicated: J47.9

## 2020-02-20 HISTORY — DX: Essential (primary) hypertension: I10

## 2020-02-20 HISTORY — DX: Chronic diastolic (congestive) heart failure: I50.32

## 2020-02-20 LAB — HEPATIC FUNCTION PANEL
ALT: 43 U/L (ref 0–44)
AST: 29 U/L (ref 15–41)
Albumin: 2.6 g/dL — ABNORMAL LOW (ref 3.5–5.0)
Alkaline Phosphatase: 83 U/L (ref 38–126)
Bilirubin, Direct: 0.4 mg/dL — ABNORMAL HIGH (ref 0.0–0.2)
Indirect Bilirubin: 0.2 mg/dL — ABNORMAL LOW (ref 0.3–0.9)
Total Bilirubin: 0.6 mg/dL (ref 0.3–1.2)
Total Protein: 6.3 g/dL — ABNORMAL LOW (ref 6.5–8.1)

## 2020-02-20 LAB — CBC
HCT: 31.1 % — ABNORMAL LOW (ref 36.0–46.0)
Hemoglobin: 9.7 g/dL — ABNORMAL LOW (ref 12.0–15.0)
MCH: 25.5 pg — ABNORMAL LOW (ref 26.0–34.0)
MCHC: 31.2 g/dL (ref 30.0–36.0)
MCV: 81.6 fL (ref 80.0–100.0)
Platelets: 431 10*3/uL — ABNORMAL HIGH (ref 150–400)
RBC: 3.81 MIL/uL — ABNORMAL LOW (ref 3.87–5.11)
RDW: 17.2 % — ABNORMAL HIGH (ref 11.5–15.5)
WBC: 9.1 10*3/uL (ref 4.0–10.5)
nRBC: 0 % (ref 0.0–0.2)

## 2020-02-20 LAB — BASIC METABOLIC PANEL
Anion gap: 11 (ref 5–15)
BUN: 8 mg/dL (ref 6–23)
BUN: 6 mg/dL — ABNORMAL LOW (ref 8–23)
CO2: 26 mEq/L (ref 19–32)
CO2: 22 mmol/L (ref 22–32)
Calcium: 8.7 mg/dL (ref 8.4–10.5)
Calcium: 8.7 mg/dL — ABNORMAL LOW (ref 8.9–10.3)
Chloride: 85 mEq/L — ABNORMAL LOW (ref 96–112)
Chloride: 86 mmol/L — ABNORMAL LOW (ref 98–111)
Creatinine, Ser: 0.53 mg/dL (ref 0.40–1.20)
Creatinine, Ser: 0.49 mg/dL (ref 0.44–1.00)
GFR calc Af Amer: >60 mL/min (ref >60)
GFR calc non Af Amer: >60 mL/min (ref >60)
GFR: 114.56 mL/min (ref >60.00)
Glucose, Bld: 115 mg/dL — ABNORMAL HIGH (ref 70–99)
Glucose, Bld: 123 mg/dL — ABNORMAL HIGH (ref 70–99)
Potassium: 3.2 mEq/L — ABNORMAL LOW (ref 3.5–5.1)
Potassium: 3.4 mmol/L — ABNORMAL LOW (ref 3.5–5.1)
Sodium: 119 mEq/L — CL (ref 135–145)
Sodium: 119 mmol/L — CL (ref 135–145)

## 2020-02-20 LAB — SARS CORONAVIRUS 2 BY RT PCR (HOSPITAL ORDER, PERFORMED IN ~~LOC~~ HOSPITAL LAB): SARS Coronavirus 2: NEGATIVE

## 2020-02-20 MED ORDER — RIFAMPIN 300 MG PO CAPS
600.0000 mg | ORAL_CAPSULE | Freq: Every day | ORAL | Status: DC
  Filled 2020-02-20: qty 2

## 2020-02-20 MED ORDER — ACETAMINOPHEN 650 MG RE SUPP: 650.0000 mg | Freq: Four times a day (QID) | RECTAL | Status: DC | PRN

## 2020-02-20 MED ORDER — ONDANSETRON HCL 4 MG PO TABS: 8.0000 mg | ORAL_TABLET | Freq: Four times a day (QID) | ORAL | 2 refills | Status: DC | PRN

## 2020-02-20 MED ORDER — SODIUM CHLORIDE 0.9 % IV BOLUS
1000.0000 mL | Freq: Once | INTRAVENOUS | Status: AC
  Administered 2020-02-20: 1000 mL via INTRAVENOUS

## 2020-02-20 MED ORDER — MIRTAZAPINE 15 MG PO TABS
7.5000 mg | ORAL_TABLET | Freq: Every day | ORAL | Status: DC
  Administered 2020-02-21 – 2020-02-23 (×4): 7.5 mg via ORAL
  Filled 2020-02-20 (×4): qty 1

## 2020-02-20 MED ORDER — ONDANSETRON HCL 4 MG/2ML IJ SOLN
4.0000 mg | Freq: Four times a day (QID) | INTRAMUSCULAR | Status: DC | PRN
  Administered 2020-02-21: 4 mg via INTRAVENOUS
  Filled 2020-02-20: qty 2

## 2020-02-20 MED ORDER — TAMOXIFEN CITRATE 10 MG PO TABS
20.0000 mg | ORAL_TABLET | Freq: Every day | ORAL | Status: DC
  Administered 2020-02-21 – 2020-02-24 (×4): 20 mg via ORAL
  Filled 2020-02-20 (×4): qty 2

## 2020-02-20 MED ORDER — ETHAMBUTOL HCL 400 MG PO TABS
600.0000 mg | ORAL_TABLET | Freq: Every day | ORAL | Status: DC
  Filled 2020-02-20: qty 1.5

## 2020-02-20 MED ORDER — AZITHROMYCIN 250 MG PO TABS: 500.0000 mg | ORAL_TABLET | Freq: Every day | ORAL | Status: DC

## 2020-02-20 MED ORDER — ACYCLOVIR 400 MG PO TABS
400.0000 mg | ORAL_TABLET | Freq: Two times a day (BID) | ORAL | Status: DC
  Administered 2020-02-21 – 2020-02-24 (×8): 400 mg via ORAL
  Filled 2020-02-20 (×8): qty 1

## 2020-02-20 MED ORDER — GABAPENTIN 300 MG PO CAPS
300.0000 mg | ORAL_CAPSULE | Freq: Every day | ORAL | Status: DC
  Administered 2020-02-21 – 2020-02-23 (×4): 300 mg via ORAL
  Filled 2020-02-20 (×4): qty 1

## 2020-02-20 MED ORDER — IOHEXOL 300 MG/ML  SOLN
75.0000 mL | Freq: Once | INTRAMUSCULAR | Status: AC | PRN
  Administered 2020-02-20: 75 mL via INTRAVENOUS

## 2020-02-20 MED ORDER — LOSARTAN POTASSIUM 50 MG PO TABS: 25.0000 mg | ORAL_TABLET | Freq: Two times a day (BID) | ORAL | Status: DC

## 2020-02-20 MED ORDER — ACETAMINOPHEN 325 MG PO TABS: 650.0000 mg | ORAL_TABLET | Freq: Four times a day (QID) | ORAL | Status: DC | PRN

## 2020-02-20 MED ORDER — ALBUTEROL SULFATE (2.5 MG/3ML) 0.083% IN NEBU: 3.0000 mL | INHALATION_SOLUTION | Freq: Four times a day (QID) | RESPIRATORY_TRACT | Status: DC | PRN

## 2020-02-20 MED ORDER — PROCHLORPERAZINE EDISYLATE 10 MG/2ML IJ SOLN: 10.0000 mg | Freq: Four times a day (QID) | INTRAMUSCULAR | Status: DC | PRN

## 2020-02-20 MED ORDER — ETHAMBUTOL HCL 400 MG PO TABS: 400.0000 mg | ORAL_TABLET | Freq: Every day | ORAL | Status: DC

## 2020-02-20 MED ORDER — POLYETHYLENE GLYCOL 3350 17 G PO PACK: 17.0000 g | PACK | Freq: Every day | ORAL | Status: DC | PRN

## 2020-02-20 MED ORDER — HYDROCODONE-ACETAMINOPHEN 5-325 MG PO TABS: 1.0000 | ORAL_TABLET | Freq: Three times a day (TID) | ORAL | Status: DC | PRN

## 2020-02-20 MED ORDER — POTASSIUM CHLORIDE CRYS ER 20 MEQ PO TBCR
40.0000 meq | EXTENDED_RELEASE_TABLET | Freq: Once | ORAL | Status: AC
  Administered 2020-02-21: 40 meq via ORAL
  Filled 2020-02-20: qty 2

## 2020-02-20 MED ORDER — ENOXAPARIN SODIUM 40 MG/0.4ML ~~LOC~~ SOLN
40.0000 mg | SUBCUTANEOUS | Status: DC
  Administered 2020-02-21 – 2020-02-23 (×4): 40 mg via SUBCUTANEOUS
  Filled 2020-02-20 (×4): qty 0.4

## 2020-02-20 NOTE — Progress Notes (Signed)
Subjective:    Patient ID: Anna Valentine, female    DOB: Dec 09, 1950, 69 y.o.   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 Pseudomonas pansensitive  ROV 12/20/19 --Anna Valentine is 35 and has a history of right sided non-small cell lung cancer status post resection.  Also with bronchiectasis, known Mycobacterium avium, colonized with Pseudomonas, based on bronchoscopy 1 year ago (complicated by pneumothorax).  She had been clinically stable with only waxing and waning nodular disease by CT so we deferred treatment of the [facility].  Since last visit however she has been treated multiple times for exacerbations/pneumonias including in mid May and now again at the beginning of this month.  Is on levofloxacin now.  Based on the recurrent symptoms her CT chest was repeated 12/18/2019 and I have reviewed.  This shows enlargement with thick-walled cavitary lesion in the posterior right upper lobe compared with priors, a new dense area of consolidation extending from the right middle lobe bronchus and right hilum into the lateral segment of the right middle lobe, increased clustered nodularity throughout the right lung.  She restarted the levaquin on , reports that she is a bit better >. No fevers , less cough. She still has R back pain, fatigue.   ROV 01/03/20 --follow-up visit for Anna Valentine, 69 year old woman with a history of right-sided non-small cell lung cancer post resection, bronchiectasis with known Mycobacterium avium and Pseudomonas.  She has been sick for over a month with symptoms consistent with pneumonia, treated with levofloxacin.  There is an enlarging area of thick-walled cavitation and dense consolidation on CT scan, difficult to interpret, could be consistent with infection/abscess but certainly concerning for malignancy. Her fevers, chills, cough have not resolved. The mucous production is less. She is completing Augmentin now.   PET  scan was done 12/31/2019 and I have reviewed.  This shows intense hypermetabolism in the cavitary lesion as well as the right lung parenchyma.  Is also a focus of hypermetabolic peripheral pleural thickening and some scattered other nodule hypermetabolic areas.  ROV 01/22/20 -follow-up visit for 69 year old woman with history of right-sided non-small cell lung cancer, significant bronchiectasis with known Mycobacterium avium.  She had PET positive progressive right lung disease which prompted bronchoscopy which was done on 01/08/2020.  All of her cytologies have shown granulomatous inflammation without any evidence of malignancy.  Her AFB is positive for MAIC, sensitivities pending.  She continues to feel very poorly with significant dyspnea on exertion, fatigue, chills and fever.  She is currently off antibiotics.  ROV 02/20/20 --this been a very difficult interval for Anna Valentine, 69 year old woman with a history of breast cancer on tamoxifen, right-sided non-small cell lung cancer post resection (remote), significant bronchiectasis and structural abnormality with evolving pneumonia/cavitary lesions.  Bronchoscopy revealed no malignancy but MAIC.  She was started on azithromycin, ethambutol, rifampin.   She has had sweats, weakness, fatigue and profound nausea with poor p.o. intake.  She was in the emergency department 8/9 due to all the symptoms, found to be dehydrated with a sodium 126.  Chest x-ray did not reveal any new abnormalities. She is having progressive dyspnea over the last 2 weeks. Having back pain as well. Significant nausea - unable to take much PO at all.   MAIC sensitivity data (01/08/2020): Amikacin susceptible, clarithromycin susceptible, linezolid, streptomycin and moxifloxacin resistant.    Review of Systems  Constitutional: Positive for activity change and fatigue. Negative for fever and unexpected weight change.  HENT: Negative.  Negative for congestion, dental problem, ear pain,  nosebleeds, postnasal drip, rhinorrhea, sinus pressure, sneezing, sore throat and trouble swallowing.   Eyes: Negative.  Negative for redness and itching.  Respiratory: Negative.  Negative for cough, chest tightness, shortness of breath and wheezing.   Cardiovascular: Negative.  Negative for palpitations and leg swelling.  Gastrointestinal: Negative.  Negative for nausea and vomiting.  Genitourinary: Negative.  Negative for dysuria.  Musculoskeletal: Positive for back pain. Negative for joint swelling.  Skin: Negative.  Negative for rash.  Neurological: Negative.  Negative for headaches.  Hematological: Negative.  Does not bruise/bleed easily.  Psychiatric/Behavioral: Negative.  Negative for dysphoric mood. The patient is not nervous/anxious.         Objective:   Physical Exam Vitals:   02/20/20 0840  BP: 108/70  Pulse: (!) 102  Temp: (!) 97.3 F (36.3 C)  TempSrc: Temporal  SpO2: 98%  Weight: 99 lb 9.6 oz (45.2 kg)  Height: 5\' 6"  (1.676 m)   Gen: tired, ill appearing, thin woman, well-nourished, in no distress  ENT: No lesions,  mouth clear,  oropharynx clear, no postnasal drip  Neck: No JVD, no stridor  Lungs: No use of accessory muscles, coarse R lung with some inspiratory and expiratory rhonchi  Cardiovascular: RRR, heart sounds normal, no murmur or gallops, no peripheral edema  Musculoskeletal: No deformities, no cyanosis or clubbing  Neuro: alert, non focal  Skin: Warm, no lesions or rash     Assessment & Plan:  Mycobacterium avium infection ([place]) With severe right upper lobe and right middle lobe disease, architectural destruction, cavitary component.  She is on azithromycin ethambutol rifampin.  Course unfortunately consistent with continued decline, poor p.o., severe nausea, back pain and dyspnea.  It is not clear to me that were going to be able to get this treated medically.  We may need to consider surgical options.   Chest x-ray today Please continue  your azithromycin, rifampin, ethambutol as you have been taking them.  Depending on how symptoms progress we may have to speak with Dr. Megan Salon about adjusting Depending on your lab work, your nausea and ability to take nutrition we may need to consider admission to the hospital.  We will discuss further in the coming days. We will also review your chest imaging with thoracic surgery if we do not believe we are making any progress with medical treatment. Follow up in our off ice with APP next week Follow with Dr Lamonte Sakai in 1 month  Hyponatremia Presumed hypovolemic due to her nausea, very poor p.o. intake.  She got IV fluids in the ED earlier this week.  We will recheck BMP today.  I will increase her Zofran which will hopefully help with symptoms, allow her to have more intake.  If inadequate or if her sodium continues to drop then she will likely need to be admitted for IV fluids and further care.  Baltazar Apo, MD, PhD 02/20/2020, 9:10 AM [facility] [phone] or if no answer [phone]

## 2020-02-20 NOTE — ED Provider Notes (Signed)
[facility] Provider Note   CSN: [id number] Arrival date & time: 02/20/20  1559     History Chief Complaint  Patient presents with  . Abnormal Lab    Anna Valentine is a 69 y.o. female with a history of breast cancer, Mycobacterium avium undergoing treatment with ID, presenting to the emergency department with hyponatremia.  Medical records show notes from Dr Baltazar Apo Pulm Disease specialist from today with concern for hyponatremia, felt to be 2/2 patient's chronic poor intake. BMP in office today was Na 119, K 3.2.  She is on azithromycin, ethambutol, and rifampin.  She has significant right upper lobe and middle lobe disease with architectural destruction and cavitary components from her MAC infection.  Patient reports to me that she has had a very poor appetite, she feels nauseated trying to eat or drink anything.  She drinks very little water but sometimes ginger ale.  She denies any seizures.    HPI     Past Medical History:  Diagnosis Date  . Anemia   . BRCA negative 2011   BRCA I/ II negative  . Breast cancer ([place]) 08/2009   stage 2, rx with lumpectomy and xrt  . CHF (congestive heart failure) ([place])   . Chronic diastolic CHF (congestive heart failure) ([place]) 02/20/2020  . Constipation   . COPD (chronic obstructive pulmonary disease) (HCC)    pt.unsure of diagnosis status  . Emphysema of lung ([name])    pt. questions diagnosis  . Essential hypertension 02/20/2020  . Family history of adverse reaction to anesthesia    My Sister has nausea  . Family history of breast cancer   . Family history of colon cancer   . Family history of lung cancer   . GERD (gastroesophageal reflux disease)    not presently having symptoms  . Hypertension   . Iatrogenic pneumothorax 02/06/2019  . Lung cancer ([place]) 06/06/05   stage 1 poorly differentiated adenocarcinoma, s/p right lower lobectomy.  . Osteopenia 09/13/2013  . Osteoporosis   .  Personal history of lung cancer   . Pneumonia    2007ish, walking pnemonia - 2009 ish  . STD (sexually transmitted disease)    HSV  . Vitamin D deficiency 09/13/2013    Patient Active Problem List   Diagnosis Date Noted  . Dehydration with hyponatremia 02/20/2020  . Intractable nausea and vomiting 02/20/2020  . Low back pain 02/20/2020  . Chronic diastolic CHF (congestive heart failure) ([place]) 02/20/2020  . Essential hypertension 02/20/2020  . Acute metabolic encephalopathy 57/07/7791  . Mycobacterium avium infection ([place]) 02/11/2020  . Right epiretinal membrane 01/30/2020  . 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 Nuclear sclerotic cataract of right eye 01/30/2020  . Nuclear sclerotic cataract of left eye 01/30/2020  . Iron deficiency anemia 01/20/2020  . Malignant neoplasm of overlapping sites of right breast in female, estrogen receptor positive ([place]) 09/20/2019  . Weight loss, non-intentional 09/20/2019  . Postprocedural pneumothorax   . 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 Port-A-Cath in place 04/26/2018  . Genetic testing 02/20/2018  . Family history of breast cancer   . Family history of lung cancer   . Personal history of lung cancer   . Chest pain 11/28/2017  . Abnormal CT of the chest 11/30/2016  . Malignant neoplasm of upper-inner quadrant of left breast in female, estrogen receptor positive ([place]) 06/16/2014  . Osteopenia 09/13/2013  . Postmenopausal atrophic vaginitis 09/13/2013  . Vitamin D deficiency 09/13/2013  . Malignant neoplasm  of lower lobe of right lung ([place]) 07/24/2012  . History of lung cancer 03/11/2011  . Anxiety 03/11/2011  . History of neutropenia 03/11/2011  . Family history of colon cancer 03/11/2011  . Cough 11/15/2010  . Bronchiectasis without acute exacerbation ([place]) 11/15/2010    Past Surgical History:  Procedure Laterality Date  . BREAST BIOPSY    . BREAST LUMPECTOMY  10/12/2009   Left lumpectomy and radiation, stage II, ER/PR+, Her 2 nu  negative  . BUNIONECTOMY Bilateral   . COLONOSCOPY    . COLONOSCOPY    . LOBECTOMY Right 06/06/2005   [place]  . MASTECTOMY W/ SENTINEL NODE BIOPSY Bilateral 03/15/2018  . MASTECTOMY W/ SENTINEL NODE BIOPSY Bilateral 03/15/2018   Procedure: BILATERAL TOTAL MASTECTOMIES WITH RIGHT SENTINEL LYMPH NODE BIOPSY;  Surgeon: Rolm Bookbinder, MD;  Location: [facility];  Service: General;  Laterality: Bilateral;  . PORTACATH PLACEMENT N/A 03/15/2018   Procedure: INSERTION PORT-A-CATH WITH [place];  Surgeon: Rolm Bookbinder, MD;  Location: [facility];  Service: General;  Laterality: N/A;  . TONSILLECTOMY    . TUBAL LIGATION  1984  . VIDEO BRONCHOSCOPY  02/06/2019   Flexible video fiberoptic bronchoscopy with electromagnetic navigation and biopsies.  Marland Kitchen VIDEO BRONCHOSCOPY WITH ENDOBRONCHIAL NAVIGATION Left 02/06/2019   Procedure: VIDEO BRONCHOSCOPY WITH ENDOBRONCHIAL NAVIGATION, left lung;  Surgeon: Collene Gobble, MD;  Location: [facility];  Service: Thoracic;  Laterality: Left;  Marland Kitchen VIDEO BRONCHOSCOPY WITH ENDOBRONCHIAL NAVIGATION N/A 01/08/2020   Procedure: VIDEO BRONCHOSCOPY WITH ENDOBRONCHIAL NAVIGATION;  Surgeon: Collene Gobble, MD;  Location: [facility];  Service: Thoracic;  Laterality: N/A;     OB History    Gravida  2   Para  0   Term  0   Preterm  0   AB  2   Living        SAB      TAB      Ectopic      Multiple      Live Births              Family History  Problem Relation Age of Onset  . Allergies Mother   . Asthma Mother   . Lung cancer Mother   . Breast cancer Mother 64       recurrence age 81  . Colon cancer Father 64  . Prostate cancer Brother 59  . Breast cancer Sister 71       Recurrence age 40 BRCA negative  . Colon polyps Sister   . Leukemia Sister   . Breast cancer Sister 1  . Prostate cancer Brother 42  . Breast cancer Maternal Grandmother 64  . Colon cancer Maternal Aunt   . Leukemia Maternal Grandfather   . Lung cancer Maternal Aunt   . Breast cancer Cousin   .  Esophageal cancer Neg Hx   . Rectal cancer Neg Hx   . Stomach cancer Neg Hx     Social History   Tobacco Use  . Smoking status: Former Smoker    Packs/day: 2.00    Years: 18.00    Pack years: 36.00    Quit date: 07/12/1987    Years since quitting: 32.6  . Smokeless tobacco: Never Used  Vaping Use  . Vaping Use: Never used  Substance Use Topics  . Alcohol use: Yes    Alcohol/week: 14.0 standard drinks    Types: 7 Glasses of wine, 7 Shots of liquor per week    Comment: social  . Drug use: No  Home Medications Prior to Admission medications   Medication Sig Start Date End Date Taking? Authorizing Provider  acyclovir (ZOVIRAX) 400 MG tablet Take 1 tablet (400 mg total) by mouth 2 (two) times daily. 01/08/20  Yes Collene Gobble, MD  albuterol (VENTOLIN HFA) 108 (90 Base) MCG/ACT inhaler Inhale 2 puffs into the lungs every 6 (six) hours as needed for wheezing or shortness of breath. 12/02/19  Yes Baxley, Cresenciano Lick, MD  azithromycin (ZITHROMAX) 500 MG tablet Take 1 tablet (500 mg total) by mouth daily. 02/12/20  Yes Kuppelweiser, Cassie L, RPH-CPP  ethambutol (MYAMBUTOL) 100 MG tablet Take 2 tablets (200 mg total) by mouth daily. Take with one 400 mg tablet to equal 600 mg total. 02/12/20  Yes Kuppelweiser, Cassie L, RPH-CPP  ethambutol (MYAMBUTOL) 400 MG tablet Take 1 tablet (400 mg total) by mouth daily. Take with two 100 mg tablets to equal 600 mg total. 02/12/20  Yes Kuppelweiser, Cassie L, RPH-CPP  Ferrous Sulfate (IRON) 28 MG TABS Take 28 mg by mouth daily.    Yes [provider]  folic acid (FOLVITE) 1 MG tablet Take 1 tablet (1 mg total) by mouth daily. 02/05/20  Yes Magrinat, Virgie Dad, MD  gabapentin (NEURONTIN) 300 MG capsule Take 1 capsule (300 mg total) by mouth at bedtime. 02/05/20  Yes Magrinat, Virgie Dad, MD  HYDROcodone-acetaminophen (NORCO) 5-325 MG tablet Take 1 tablet by mouth every 8 (eight) hours as needed for up to 5 days for moderate pain (Take with food and take anti  nausea med one half hour prior to taking pain medication). 02/19/20 02/24/20 Yes Baxley, Cresenciano Lick, MD  ibuprofen (ADVIL) 200 MG tablet Take 200 mg by mouth every 6 (six) hours as needed for moderate pain.    Yes [provider]  mirtazapine (REMERON) 7.5 MG tablet Take 1 tablet (7.5 mg total) by mouth at bedtime. 02/05/20  Yes Magrinat, Virgie Dad, MD  Multiple Vitamins-Minerals (MULTIVITAMIN WITH MINERALS) tablet Take 1 tablet by mouth daily.   Yes [provider]  ondansetron (ZOFRAN) 4 MG tablet Take 2 tablets (8 mg total) by mouth every 6 (six) hours as needed for nausea or vomiting. 02/20/20  Yes Byrum, Rose Fillers, MD  prochlorperazine (COMPAZINE) 5 MG tablet Take 2 tablets (10 mg total) by mouth every 6 (six) hours as needed for nausea or vomiting. 02/05/20  Yes Magrinat, Virgie Dad, MD  Psyllium (METAMUCIL PO) Take 1 Scoop by mouth daily as needed (constipation).     Yes [provider]  rifampin (RIFADIN) 300 MG capsule Take 2 capsules (600 mg total) by mouth daily. 02/14/20  Yes Kuppelweiser, Cassie L, RPH-CPP  tamoxifen (NOLVADEX) 20 MG tablet Take 20 mg by mouth at bedtime.  02/05/20  Yes [provider]  vitamin B-12 (CYANOCOBALAMIN) 500 MCG tablet Take 500 mcg by mouth daily.    Yes [provider]    Allergies    Taxol [paclitaxel], Tape, Abraxane [paclitaxel protein-bound part], and Clarithromycin  Review of Systems   Review of Systems  Constitutional: Positive for activity change, appetite change and unexpected weight change. Negative for chills and fever.  HENT: Negative for ear pain and sore throat.   Eyes: Negative for pain and visual disturbance.  Respiratory: Positive for cough and shortness of breath.   Cardiovascular: Negative for chest pain and palpitations.  Gastrointestinal: Positive for nausea. Negative for abdominal pain.  Genitourinary: Negative for dysuria and hematuria.  Musculoskeletal: Negative for arthralgias and back pain.    Skin: Negative  for color change and rash.  Neurological: Negative for syncope and headaches.  Psychiatric/Behavioral: Negative for agitation and confusion.  All other systems reviewed and are negative.   Physical Exam Updated Vital Signs BP 139/76 (BP Location: Left Arm)   Pulse 92   Temp 98.1 F (36.7 C) (Oral)   Resp 20   Ht _0  (1.676 m)   Wt 44.9 kg   SpO2 97%   BMI 15.98 kg/m   Physical Exam Vitals and nursing note reviewed.  Constitutional:      General: She is not in acute distress.    Appearance: She is well-developed.     Comments: Thin habitus, chronically ill appearing  HENT:     Head: Normocephalic and atraumatic.  Eyes:     Conjunctiva/sclera: Conjunctivae normal.  Cardiovascular:     Rate and Rhythm: Normal rate and regular rhythm.     Pulses: Normal pulses.  Pulmonary:     Effort: Pulmonary effort is normal. No respiratory distress.     Comments: Coarse breath sounds in right lung fields 99% on room air Abdominal:     Palpations: Abdomen is soft.     Tenderness: There is no abdominal tenderness.  Musculoskeletal:     Cervical back: Neck supple.  Skin:    General: Skin is warm and dry.  Neurological:     General: No focal deficit present.     Mental Status: She is alert and oriented to person, place, and time.  Psychiatric:        Mood and Affect: Mood normal.        Behavior: Behavior normal.     ED Results / Procedures / Treatments   Labs (all labs ordered are listed, but only abnormal results are displayed) Labs Reviewed  CBC - Abnormal; Notable for the following components:      Result Value   RBC 3.81 (*)    Hemoglobin 9.7 (*)    HCT 31.1 (*)    MCH 25.5 (*)    RDW 17.2 (*)    Platelets 431 (*)    All other components within normal limits  BASIC METABOLIC PANEL - Abnormal; Notable for the following components:   Sodium 119 (*)    Potassium 3.4 (*)    Chloride 86 (*)    Glucose, Bld 123 (*)    BUN 6 (*)    Calcium 8.7 (*)     All other components within normal limits  HEPATIC FUNCTION PANEL - Abnormal; Notable for the following components:   Total Protein 6.3 (*)    Albumin 2.6 (*)    Bilirubin, Direct 0.4 (*)    Indirect Bilirubin 0.2 (*)    All other components within normal limits  IRON AND TIBC - Abnormal; Notable for the following components:   TIBC 176 (*)    All other components within normal limits  OSMOLALITY - Abnormal; Notable for the following components:   Osmolality 253 (*)    All other components within normal limits  BASIC METABOLIC PANEL - Abnormal; Notable for the following components:   Sodium 119 (*)    Chloride 89 (*)    CO2 20 (*)    Glucose, Bld 101 (*)    BUN 7 (*)    Calcium 8.2 (*)    All other components within normal limits  MAGNESIUM - Abnormal; Notable for the following components:   Magnesium 1.6 (*)    All other components within normal limits  SARS CORONAVIRUS 2 BY RT  PCR (HOSPITAL ORDER, [facility])  LIPASE, BLOOD  VITAMIN B12  FOLATE  TSH  URINALYSIS, ROUTINE W REFLEX MICROSCOPIC  OSMOLALITY, URINE  SODIUM, URINE, RANDOM  BASIC METABOLIC PANEL  BASIC METABOLIC PANEL  BASIC METABOLIC PANEL  MAGNESIUM  CBC WITH DIFFERENTIAL/PLATELET  HIV ANTIBODY (ROUTINE TESTING W REFLEX)  BASIC METABOLIC PANEL    EKG None  Radiology DG Chest 2 View  Result Date: 02/20/2020 CLINICAL DATA:  Shortness of breath EXAM: CHEST - 2 VIEW COMPARISON:  01/08/2020.  12/02/2019. FINDINGS: Improvement compared to the study 01/08/2020. Background pattern of emphysema of with bolus disease more extensive on the right than the left. Widespread pulmonary infiltrate previously seen on the right has improved, but there are some persistent areas of density that could be some ongoing pneumonia. Small amount of pleural fluid present on the right. No acute bone finding. IMPRESSION: Improvement of widespread pulmonary infiltrate on the right. Some persistent areas of  density could be ongoing pneumonia. Small right effusion. Background pattern emphysema with severe bullous disease on the right Electronically Signed   By: Nelson Chimes M.D.   On: 02/20/2020 16:04   DG Lumbar Spine 2-3 Views  Result Date: 02/20/2020 CLINICAL DATA:  Low back pain EXAM: LUMBAR SPINE - 2-3 VIEW COMPARISON:  None. FINDINGS: Degenerative facet disease in the lower lumbar spine. Disc space narrowing at L5-S1. No fracture or subluxation. SI joints symmetric and unremarkable. IMPRESSION: Degenerative changes.  No acute bony abnormality. Electronically Signed   By: Rolm Baptise M.D.   On: 02/20/2020 22:14   CT CHEST W CONTRAST  Result Date: 02/20/2020 CLINICAL DATA:  69 year old female with non-small cell lung cancer presenting with weight loss and shortness of breath. EXAM: CT CHEST WITH CONTRAST TECHNIQUE: Multidetector CT imaging of the chest was performed during intravenous contrast administration. CONTRAST:  48m OMNIPAQUE IOHEXOL 300 MG/ML  SOLN COMPARISON:  Chest CT dated 12/18/2019. FINDINGS: Cardiovascular: There is no cardiomegaly or pericardial effusion. There is coronary vascular calcification. Mild atherosclerotic calcification of the thoracic aorta. No aneurysmal dilatation or dissection. The origins of the great vessels of the aortic arch appear patent. No pulmonary artery embolus identified. Mediastinum/Nodes: Mildly enlarged mediastinal lymph node measures 13 mm in short axis anterior to the carina. The esophagus is grossly unremarkable. No mediastinal fluid collection. Lungs/Pleura: There is postoperative changes of right lower lobectomy with associated volume loss and shift of the mediastinum into the right hemithorax. The previously described masslike consolidation in the right middle lobe appears less solid on today's exam and has been replaced by areas of cavitation and cystic changes. This likely represented an infected cavitary lesion. There is diffuse tubular bronchiectasis  of the right lung. There is a mildly lobulated cavitary lesion extending from the posterior aspect of the right upper lobe inferiorly along the fissure. Several additional cavitary lesions noted throughout the right lung which appear progressed since the prior CT. An area of subpleural consolidation at the right lung base again noted similar to prior CT which may represent atelectasis or postoperative scarring versus pneumonia. There is diffuse scattered pulmonary nodules in the right lung which may be inflammatory or infectious in etiology. Metastatic disease is favored less likely but not excluded clinical correlation is recommended. Several small scattered nodules measuring 3-4 mm noted in the left lower lobe, likely post inflammatory. A cluster of small nodular densities in the lingula with a small cavitary focus noted which appears similar to prior CT. There is no pneumothorax. Secretin is  noted in the trachea and right mainstem bronchus. Upper Abdomen: There is a 2 cm right renal cyst. Musculoskeletal: There is loss of subcutaneous fat and cachexia. No acute osseous pathology. IMPRESSION: 1. Postsurgical changes of right lower lobectomy with associated volume loss and shift of the mediastinum into the right hemithorax. 2. The previously described masslike consolidation in the right middle lobe appears less solid on today's exam and has been replaced by areas of cavitation and cystic changes. This likely represented an infected an impacted cavitary lesion. Continued follow-up recommended. 3. Multiple cystic and cavitary lesions throughout the right lung as well as diffuse right lung bronchiectasis. 4. Diffuse scattered pulmonary nodules in the right lung may be inflammatory or infectious in etiology. Metastatic disease is favored less likely but not excluded. Clinical correlation is recommended. 5. An area of subpleural consolidation at the right lung base again noted similar to prior CT which may represent  atelectasis or postoperative scarring versus pneumonia. 6. Aortic Atherosclerosis (ICD10-I70.0). Electronically Signed   By: Anner Crete M.D.   On: 02/20/2020 22:39   DG Abd 2 Views  Result Date: 02/20/2020 CLINICAL DATA:  69 year old female with low back pain.  Lung cancer. EXAM: ABDOMEN - 2 VIEW COMPARISON:  PET-CT 12/31/2019. FINDINGS: Upright and supine views of the abdomen and pelvis. No pneumoperitoneum. Partially visible abnormal right lung, not significantly changed from the [place] view of the PET-CT. Non obstructed bowel gas pattern. Chronic probable fibroid related dystrophic calcification in the right hemipelvis. Superimposed vascular calcifications in the abdomen and pelvis. No acute osseous abnormality identified. IMPRESSION: 1.  Normal bowel gas pattern, no free air. 2. Partially visible abnormal right lung appears grossly stable since June. Electronically Signed   By: Genevie Ann M.D.   On: 02/20/2020 22:15    Procedures Procedures (including critical care time)  Medications Ordered in ED Medications  HYDROcodone-acetaminophen (NORCO/VICODIN) 5-325 MG per tablet 1 tablet (has no administration in time range)  acyclovir (ZOVIRAX) tablet 400 mg (400 mg Oral Given 02/21/20 0052)  azithromycin (ZITHROMAX) tablet 500 mg (has no administration in time range)  ethambutol (MYAMBUTOL) tablet 600 mg (has no administration in time range)  rifampin (RIFADIN) capsule 600 mg (has no administration in time range)  tamoxifen (NOLVADEX) tablet 20 mg (has no administration in time range)  mirtazapine (REMERON) tablet 7.5 mg (7.5 mg Oral Given 02/21/20 0052)  gabapentin (NEURONTIN) capsule 300 mg (300 mg Oral Given 02/21/20 0052)  albuterol (PROVENTIL) (2.5 MG/3ML) 0.083% nebulizer solution 3 mL (has no administration in time range)  ondansetron (ZOFRAN) injection 4 mg (4 mg Intravenous Given 02/21/20 0007)  prochlorperazine (COMPAZINE) injection 10 mg (has no administration in time range)    polyethylene glycol (MIRALAX / GLYCOLAX) packet 17 g (has no administration in time range)  enoxaparin (LOVENOX) injection 40 mg (40 mg Subcutaneous Given 02/21/20 0011)  acetaminophen (TYLENOL) tablet 650 mg (has no administration in time range)    Or  acetaminophen (TYLENOL) suppository 650 mg (has no administration in time range)  0.9 %  sodium chloride infusion (has no administration in time range)  sodium chloride 0.9 % bolus 1,000 mL (0 mLs Intravenous Stopped 02/20/20 2259)  potassium chloride SA (KLOR-CON) CR tablet 40 mEq (40 mEq Oral Given 02/21/20 0052)  iohexol (OMNIPAQUE) 300 MG/ML solution 75 mL (75 mLs Intravenous Contrast Given 02/20/20 2216)    ED Course  I have reviewed the triage vital signs and the nursing notes.  Pertinent labs & imaging results that were available during my  care of the patient were reviewed by me and considered in my medical decision making (see chart for details).  This is a 69 year old female presenting to emergency department with hyponatremia, felt to be secondary to poor p.o. intake.  Likely hypovolemic hyponatremia.  Patient was referred in by her pulmonary disease specialist Dr. Lamonte Sakai for anticipated admission, with ID consult once in the hospital.  She is being treated ongoing for MAC with azithro, ethambutol, and rifampin.  On exam she is chronically ill-appearing but not in acute distress.  Her vitals are within normal limits.  I reviewed her labs on arrival.  Her sodium is 119.  Her chloride is low as 86.  Potassium is 3.4.  Her creatinine is 0.49.  CBC is near baseline.  Albumin is low 2.6.  She has no encephalopathy or evidence of confusion or seizures.  I suspect her hyponatremia has been a gradual decline, and do not believe she requires 3% saline infusions in critical care setting at this time.  However I will defer this decision to her inpatient care team.  Plan to initiate IV normal saline fluids and will admit to the hospitalist.     Clinical Course as of Feb 20 105  Thu Feb 20, 2020  2030 Signed out to the hospitalist   [MT]    Clinical Course User Index [MT] Langston Masker, Carola Rhine, MD    Final Clinical Impression(s) / ED Diagnoses Final diagnoses:  Hyponatremia    Rx / DC Orders ED Discharge Orders    None       Wyvonnia Dusky, MD 02/21/20 [phone]

## 2020-02-20 NOTE — ED Triage Notes (Addendum)
Pt sent by PCP for hyponatremia. Na level 119 at PCP's office today.

## 2020-02-20 NOTE — ED Notes (Signed)
Patient transported to X-ray 

## 2020-02-20 NOTE — Assessment & Plan Note (Addendum)
With severe right upper lobe and right middle lobe disease, architectural destruction, cavitary component.  She is on azithromycin ethambutol rifampin.  Course unfortunately consistent with continued decline, poor p.o., severe nausea, back pain and dyspnea.  It is not clear to me that were going to be able to get this treated medically.  We may need to consider surgical options.   Chest x-ray today Please continue your azithromycin, rifampin, ethambutol as you have been taking them.  Depending on how symptoms progress we may have to speak with Dr. Megan Salon about adjusting Depending on your lab work, your nausea and ability to take nutrition we may need to consider admission to the hospital.  We will discuss further in the coming days. We will also review your chest imaging with thoracic surgery if we do not believe we are making any progress with medical treatment. Follow up in our off ice with APP next week Follow with Dr Lamonte Sakai in 1 month

## 2020-02-20 NOTE — Assessment & Plan Note (Signed)
Presumed hypovolemic due to her nausea, very poor p.o. intake.  She got IV fluids in the ED earlier this week.  We will recheck BMP today.  I will increase her Zofran which will hopefully help with symptoms, allow her to have more intake.  If inadequate or if her sodium continues to drop then she will likely need to be admitted for IV fluids and further care.

## 2020-02-20 NOTE — Telephone Encounter (Addendum)
Alger Simons  [phone]  Nunzio Cory called and would like for you to call her when you have time.

## 2020-02-20 NOTE — Patient Instructions (Signed)
We will increase her Zofran.  You can take 8 mg up to every 6 hours as needed to control your nausea.  A new prescription will be sent Lab work today.   Chest x-ray today Please continue your azithromycin, rifampin, ethambutol as you have been taking them.  Depending on how symptoms progress we may have to speak with Dr. Megan Salon about adjusting Depending on your lab work, your nausea and ability to take nutrition we may need to consider admission to the hospital.  We will discuss further in the coming days. We will also review your chest imaging with thoracic surgery if we do not believe we are making any progress with medical treatment. Follow up in our off ice with APP next week Follow with Dr Lamonte Sakai in 1 month

## 2020-02-20 NOTE — Telephone Encounter (Signed)
Patient scheduled for appointment. Nothing further needed

## 2020-02-20 NOTE — H&P (Signed)
History and Physical    Anna Valentine XTK:[id number] DOB: [birth year] DOA: 02/20/2020  PCP: [name], MD  Patient coming from: Home   Chief Complaint:  Chief Complaint  Patient presents with   Abnormal Lab     HPI:   69 year old female with past medical history of Mycobacterium avium complex (Dx 12/2018, follows with Dr. Lamonte Sakai), history of breast cancer status post bilateral mastectomies, adenocarcinoma of the right lung status post right lower lobe lobectomy, bronchiectasis, hypertension, diastolic congestive heart failure who presents to [facility] emergency department after being sent by her pulmonologist Dr. Lamonte Sakai.  Patient's history is limited due to lethargy and occasional bouts of confusion throughout the interview. Patient explains that for at least the past several weeks she has been experiencing near constant nausea. This is led to extremely poor oral intake over the span of time. Patient is also complaining of some associated episodes of vomiting, occurring once or twice every other day. Patient describes the vomitus as nonbloody and typically clear in color. Patient states that over the span of time she has lost approximately 15 pounds and has developed progressively worsening generalized weakness. Patient denies associated abdominal pain. Patient denies dysuria or diarrhea. Patient denies recent travel, sick contacts or confirmed contact with COVID-19 infection.  Of note patient is status post COVID-19 vaccination several months ago.  Upon further questioning it turns out the patient has recently been placed on a regimen of ethambutol, rifampin, azithromycin  for Mycobacterium avium infection identified via bronchoscopy 12/2018.  This disease is being comanaged by both Dr.  Lamonte Sakai with pulmonary and Dr. Megan Salon with infectious disease.    Upon hydration in the emergency department patient was confirmed to have a sodium of 119.  1 L of normal saline was  administered.  Chest x-ray was performed.  Hospitalist group was then called to assess the patient for admission to the hospital.   Review of Systems:  Review of Systems  Constitutional: Positive for chills, fever, malaise/fatigue and weight loss.  Respiratory: Positive for cough and shortness of breath.   Gastrointestinal: Positive for nausea and vomiting.  Musculoskeletal: Positive for back pain.  Neurological: Positive for weakness.  All other systems reviewed and are negative.    Past Medical History:  Diagnosis Date   Anemia    BRCA negative 2011   BRCA I/ II negative   Breast cancer ([place]) 08/2009   stage 2, rx with lumpectomy and xrt   CHF (congestive heart failure) ([place])    Chronic diastolic CHF (congestive heart failure) ([place]) 02/20/2020   Constipation    COPD (chronic obstructive pulmonary disease) ([facility])    pt.unsure of diagnosis status   Emphysema of lung ([place])    pt. questions diagnosis   Essential hypertension 02/20/2020   Family history of adverse reaction to anesthesia    My Sister has nausea   Family history of breast cancer    Family history of colon cancer    Family history of lung cancer    GERD (gastroesophageal reflux disease)    not presently having symptoms   Hypertension    Iatrogenic pneumothorax 02/06/2019   Lung cancer ([place]) 06/06/05   stage 1 poorly differentiated adenocarcinoma, s/p right lower lobectomy.   Osteopenia 09/13/2013   Osteoporosis    Personal history of lung cancer    Pneumonia    2007ish, walking pnemonia - 2009 ish   STD (sexually transmitted disease)    HSV   Vitamin D deficiency 09/13/2013  Past Surgical History:  Procedure Laterality Date   BREAST BIOPSY     BREAST LUMPECTOMY  10/12/2009   Left lumpectomy and radiation, stage II, ER/PR+, Her 2 nu negative   BUNIONECTOMY Bilateral    COLONOSCOPY     COLONOSCOPY     LOBECTOMY Right 06/06/2005   [place]   MASTECTOMY W/ SENTINEL NODE BIOPSY  Bilateral 03/15/2018   MASTECTOMY W/ SENTINEL NODE BIOPSY Bilateral 03/15/2018   Procedure: BILATERAL TOTAL MASTECTOMIES WITH RIGHT SENTINEL LYMPH NODE BIOPSY;  Surgeon: Rolm Bookbinder, MD;  Location: [facility];  Service: General;  Laterality: Bilateral;   PORTACATH PLACEMENT N/A 03/15/2018   Procedure: INSERTION PORT-A-CATH WITH [place];  Surgeon: Rolm Bookbinder, MD;  Location: [facility];  Service: General;  Laterality: N/A;   [place]  02/06/2019   Flexible video fiberoptic bronchoscopy with electromagnetic navigation and biopsies.   VIDEO BRONCHOSCOPY WITH ENDOBRONCHIAL NAVIGATION Left 02/06/2019   Procedure: VIDEO BRONCHOSCOPY WITH ENDOBRONCHIAL NAVIGATION, left lung;  Surgeon: Collene Gobble, MD;  Location: [facility];  Service: Thoracic;  Laterality: Left;   VIDEO BRONCHOSCOPY WITH ENDOBRONCHIAL NAVIGATION N/A 01/08/2020   Procedure: VIDEO BRONCHOSCOPY WITH ENDOBRONCHIAL NAVIGATION;  Surgeon: Collene Gobble, MD;  Location: [facility];  Service: Thoracic;  Laterality: N/A;     reports that she quit smoking about 32 years ago. She has a 36.00 pack-year smoking history. She has never used smokeless tobacco. She reports current alcohol use of about 14.0 standard drinks of alcohol per week. She reports that she does not use drugs.  Allergies  Allergen Reactions   Taxol [Paclitaxel] Anaphylaxis   Tape Itching   Abraxane [Paclitaxel Protein-Bound Part] Rash   Clarithromycin Rash    Family History  Problem Relation Age of Onset   Allergies Mother    Asthma Mother    Lung cancer Mother    Breast cancer Mother 45       recurrence age 11   Colon cancer Father 8   Prostate cancer Brother 6   Breast cancer Sister 54       Recurrence age 12 BRCA negative   Colon polyps Sister    Leukemia Sister    Breast cancer Sister 74   Prostate cancer Brother 46   Breast cancer Maternal Grandmother 28   Colon cancer Maternal Aunt     Leukemia Maternal Grandfather    Lung cancer Maternal Aunt    Breast cancer Cousin    Esophageal cancer Neg Hx    Rectal cancer Neg Hx    Stomach cancer Neg Hx      Prior to Admission medications   Medication Sig Start Date End Date Taking? Authorizing Provider  acyclovir (ZOVIRAX) 400 MG tablet Take 1 tablet (400 mg total) by mouth 2 (two) times daily. 01/08/20   Collene Gobble, MD  albuterol (VENTOLIN HFA) 108 (90 Base) MCG/ACT inhaler Inhale 2 puffs into the lungs every 6 (six) hours as needed for wheezing or shortness of breath. 12/02/19   Baxley, Cresenciano Lick, MD  azithromycin (ZITHROMAX) 500 MG tablet Take 1 tablet (500 mg total) by mouth daily. 02/12/20   Kuppelweiser, Cassie L, RPH-CPP  calcium-vitamin D (OSCAL WITH D) 500-200 MG-UNIT tablet Take 1 tablet by mouth daily.     [provider]  cholecalciferol (VITAMIN D) 1000 UNITS tablet Take 1,000 Units by mouth daily.     [provider]  ethambutol (MYAMBUTOL) 100 MG tablet Take 2 tablets (200 mg  total) by mouth daily. Take with one 400 mg tablet to equal 600 mg total. 02/12/20   Kuppelweiser, Cassie L, RPH-CPP  ethambutol (MYAMBUTOL) 400 MG tablet Take 1 tablet (400 mg total) by mouth daily. Take with two 100 mg tablets to equal 600 mg total. 02/12/20   Kuppelweiser, Cassie L, RPH-CPP  Ferrous Sulfate (IRON) 28 MG TABS Take by mouth.     [provider]  folic acid (FOLVITE) 1 MG tablet Take 1 tablet (1 mg total) by mouth daily. 02/05/20   Magrinat, Virgie Dad, MD  gabapentin (NEURONTIN) 300 MG capsule Take 1 capsule (300 mg total) by mouth at bedtime. 02/05/20   Magrinat, Virgie Dad, MD  HYDROcodone-acetaminophen (NORCO) 5-325 MG tablet Take 1 tablet by mouth every 8 (eight) hours as needed for up to 5 days for moderate pain (Take with food and take anti nausea med one half hour prior to taking pain medication). 02/19/20 02/24/20  [name], MD  ibuprofen (ADVIL) 200 MG tablet Take 200 mg by mouth every 6 (six) hours  as needed.     [provider]  losartan (COZAAR) 25 MG tablet Take 1 tablet (25 mg total) by mouth 2 (two) times daily. 08/19/19   Larey Dresser, MD  mirtazapine (REMERON) 7.5 MG tablet Take 1 tablet (7.5 mg total) by mouth at bedtime. 02/05/20   Magrinat, Virgie Dad, MD  ondansetron (ZOFRAN) 4 MG tablet Take 2 tablets (8 mg total) by mouth every 6 (six) hours as needed for nausea or vomiting. 02/20/20   Collene Gobble, MD  prochlorperazine (COMPAZINE) 5 MG tablet Take 2 tablets (10 mg total) by mouth every 6 (six) hours as needed for nausea or vomiting. 02/05/20   Magrinat, Virgie Dad, MD  Psyllium (METAMUCIL PO) Take 1 Scoop by mouth daily as needed (constipation).      [provider]  rifampin (RIFADIN) 300 MG capsule Take 2 capsules (600 mg total) by mouth daily. 02/14/20   Kuppelweiser, Cassie L, RPH-CPP  tamoxifen (NOLVADEX) 20 MG tablet Take 20 mg by mouth daily. 02/05/20   [provider]  vitamin B-12 (CYANOCOBALAMIN) 500 MCG tablet Take 500 mcg by mouth daily.     [provider]  vitamin C (ASCORBIC ACID) 500 MG tablet Take 500 mg by mouth daily.     [provider]    Physical Exam: Vitals:   02/20/20 2030 02/20/20 2106 02/20/20 2107 02/20/20 2130  BP: (!) 151/78 (!) 159/89  (!) 150/87  Pulse: 79  89 83  Resp: (!) 22  18 (!) 22  Temp:      TempSrc:      SpO2: 100%  100% 100%  Weight:      Height:        Constitutional: Acute alert and oriented x3, no associated distress.  Patient is somewhat cachectic, exhibiting temporal muscle wasting. Skin: no rashes, no lesions, extremely poor skin turgor noted. Eyes: Pupils are equally reactive to light.  No evidence of scleral icterus or conjunctival pallor.  ENMT: Extremely dry mucous membranes noted.  Posterior pharynx clear of any exudate or lesions.   Neck: normal, supple, no masses, no thyromegaly.  No evidence of jugular venous distension.   Respiratory: Coarse breath sounds throughout the  right lung fields with scattered rhonchi.  No evidence of wheezing.  Bibasilar rales.   Normal respiratory effort. No accessory muscle use.  Cardiovascular: Regular rate and rhythm, no murmurs / rubs / gallops. No extremity edema. 2+ pedal  pulses. No carotid bruits.  Chest:   Nontender without crepitus or deformity.   Back:   Nontender without crepitus or deformity. Abdomen: Abdomen is soft and nontender.  No evidence of intra-abdominal masses.  Positive bowel sounds noted in all quadrants.   Musculoskeletal: No joint deformity upper and lower extremities. Good ROM, no contractures. Normal muscle tone.  Neurologic: Patient is lethargic but arousable and oriented x3, patient does exhibit some occasional bouts of confusion throughout the interview.  CN 2-12 grossly intact. Sensation intact, patient able to move all 4 extremities spontaneously.  Patient is following all commands.  Patient is responsive to verbal stimuli.   Psychiatric: Patient exhibits a depressed mood and flat affect.  Patient seems to possess insight as to their current situation.     Labs on Admission: I have personally reviewed following labs and imaging studies -   CBC: Recent Labs  Lab 02/17/20 1638 02/20/20 1612  WBC 10.7* 9.1  NEUTROABS 9.3*  --   HGB 10.4* 9.7*  HCT 33.3* 31.1*  MCV 81.6 81.6  PLT 370 355*   Basic Metabolic Panel: Recent Labs  Lab 02/17/20 1638 02/20/20 0931 02/20/20 1612  NA 126* 119* 119*  K 3.8 3.2* 3.4*  CL 90* 85* 86*  CO2 _0 GLUCOSE 108* 115* 123*  BUN 17 8 6*  CREATININE 0.57 0.53 0.49  CALCIUM 9.0 8.7 8.7*   GFR: Estimated Creatinine Clearance: 47.7 mL/min (by C-G formula based on SCr of 0.49 mg/dL). Liver Function Tests: Recent Labs  Lab 02/17/20 1638 02/20/20 1921  AST 55* 29  ALT 57* 43  ALKPHOS 113 83  BILITOT 1.0 0.6  PROT 7.7 6.3*  ALBUMIN 2.9* 2.6*   No results for input(s): LIPASE, AMYLASE in the last 168 hours. No results for input(s): AMMONIA in the  last 168 hours. Coagulation Profile: No results for input(s): INR, PROTIME in the last 168 hours. Cardiac Enzymes: No results for input(s): CKTOTAL, CKMB, CKMBINDEX, TROPONINI in the last 168 hours. BNP (last 3 results) No results for input(s): PROBNP in the last 8760 hours. HbA1C: No results for input(s): HGBA1C in the last 72 hours. CBG: No results for input(s): GLUCAP in the last 168 hours. Lipid Profile: No results for input(s): CHOL, HDL, LDLCALC, TRIG, CHOLHDL, LDLDIRECT in the last 72 hours. Thyroid Function Tests: No results for input(s): TSH, T4TOTAL, FREET4, T3FREE, THYROIDAB in the last 72 hours. Anemia Panel: No results for input(s): VITAMINB12, FOLATE, FERRITIN, TIBC, IRON, RETICCTPCT in the last 72 hours. Urine analysis:    Component Value Date/Time   BILIRUBINUR NEG 12/17/2019 1654   PROTEINUR Negative 12/17/2019 1654   UROBILINOGEN 0.2 12/17/2019 1654   NITRITE NEG 12/17/2019 1654   LEUKOCYTESUR Negative 12/17/2019 1654    Radiological Exams on Admission - Personally Reviewed: DG Chest 2 View  Result Date: 02/20/2020 CLINICAL DATA:  Shortness of breath EXAM: CHEST - 2 VIEW COMPARISON:  01/08/2020.  12/02/2019. FINDINGS: Improvement compared to the study 01/08/2020. Background pattern of emphysema of with bolus disease more extensive on the right than the left. Widespread pulmonary infiltrate previously seen on the right has improved, but there are some persistent areas of density that could be some ongoing pneumonia. Small amount of pleural fluid present on the right. No acute bone finding. IMPRESSION: Improvement of widespread pulmonary infiltrate on the right. Some persistent areas of density could be ongoing pneumonia. Small right effusion. Background pattern emphysema with severe bullous disease on the right Electronically Signed   By: Elta Guadeloupe  Shogry M.D.   On: 02/20/2020 16:04    EKG: Personally reviewed.  Rhythm is normal sinus rhythm with heart rate of 96 bpm.   No dynamic ST segment changes appreciated.  Assessment/Plan Principal Problem:   Dehydration with hyponatremia   Patient presenting with several week history of near intractable nausea with extremely poor oral intake weight loss and decreasing urine output.  Patient now presented with sodium of 119 with generalized weakness lethargy and bouts of confusion.  Most likely etiology is severe dehydration, possibly medication induced.  1 L normal saline is already been administered by the emergency department.  We will abstain from providing any further fluids until we get a repeat sodium level.  Once this is obtained, will likely initiate low rate normal saline.  Obtaining serum osmolality, urine osmolality, TSH, serum sodium.  Performing serial chemistries to ensure slow and measured sodium correction.  Close clinical monitoring  Active Problems:   Intractable nausea and vomiting   Patient is exhibiting at least a several week history of irretractable nausea with occasional bouts of vomiting and extremely poor oral intake.  Patient's history is rather poor although it seems that this is likely medication related.  Obtaining hepatic function panel, lipase, urinalysis and abdominal x-ray.  Providing patient with regimen of both Zofran and Compazine  Will keep patient on MAI regimen for now and monitor to see if this can be tolerated in a controlled setting with regular Zofran and Compazine use.  If this is indeed secondary to medications that a conversation will need to be had with infectious disease for alternative treatment options    Acute metabolic encephalopathy   Lethargy with bouts of confusion on examination.  Likely related to substantial hyponatremia  Obtaining urinalysis, B12, folate, TSH.  Monitoring for clinical improvement of sodium levels improved.    Iron deficiency anemia   Known history of iron deficiency anemia  No clinical evidence of  bleeding.  Obtaining iron panel folate vitamin B12.    Mycobacterium avium infection ([place])   Diagnosed in June 2020 via endoscopy, follows with Dr. Lamonte Sakai with Pulmonology and Dr. Megan Salon with ID  Per pulmonology last note, if patient continues to not be able to tolerate medications due to severe nausea and surgical options may need to be considered  We will attempt to correct electrolyte abnormalities, hydrate the patient, provide patient with regular antiemetics in a controlled environment and evaluate for other possible etiologies -if patient continues to experience ongoing symptoms without alternative etiologies then will formally consult Dr. Megan Salon and cardiothoracic surgery to evaluate other treatment options.  CT imaging of the chest from June reviewed, there is questionable development of right masslike consolidations that could be infectious or malignant in etiology.  Considering patient's known history of lung malignancy we will repeat CT imaging of the chest during this hospital stay.    Low back pain   Vague complaints of severe low back pain for the past 3 days.  Will obtain back x-ray.    Chronic diastolic CHF (congestive heart failure) ([facility])   We will monitor for any evidence of volume overload as we volume resuscitate the patient.    Essential hypertension    Patient is quite hypertensive in the emergency department, will restart home antihypertensive therapy   Code Status:  Full code Family Communication: Patient's friend has been updated on plan of care by the emergency department staff  Status is: Inpatient  Remains inpatient appropriate because:Persistent severe electrolyte disturbances, Altered mental status and Inpatient  level of care appropriate due to severity of illness   Dispo: The patient is from: Home              Anticipated d/c is to: Home              Anticipated d/c date is: 3 days              Patient currently is not medically stable to  d/c.        Vernelle Emerald MD [facility] Pager [phone]  If 7PM-7AM, please contact night-coverage [email or web address] Use universal [place] password for that web site. If you do not have the password, please call the hospital operator.  02/20/2020, 9:48 PM

## 2020-02-20 NOTE — Telephone Encounter (Signed)
BMP Latest Ref Rng & Units 02/20/2020 02/17/2020 02/05/2020  Glucose 70 - 99 mg/dL 115(H) 108(H) 134(H)  BUN 6 - 23 mg/dL 8 17 13   Creatinine 0.40 - 1.20 mg/dL 0.53 0.57 0.78  BUN/Creat Ratio 6 - 22 (calc) - - -  Sodium 135 - 145 mEq/L 119(LL) 126(L) 132(L)  Potassium 3.5 - 5.1 mEq/L 3.2(L) 3.8 3.8  Chloride 96 - 112 mEq/L 85(L) 90(L) 96(L)  CO2 19 - 32 mEq/L 26 23 25   Calcium 8.4 - 10.5 mg/dL 8.7 9.0 9.5     Labs reviewed, sodium down to 119.  Presume this is hypovolemic hyponatremia. I believe she will need to be managed in the hospital.  I tried to direct admit her but there is a backlog for admission.  I am not comfortable waiting for a direct admission bed with sodium this low.  I will ask her to go to the emergency department at [facility] where she can be managed until bed becomes available.  She will likely be admitted to hospitalist service but I have notified our team at [facility] that she is coming in that we should consult.  I spoke with Mrs. Magel who understands the plan and will head for the ER.  I also called the ER charge nurse to let them know that she was on the way.  Baltazar Apo, MD, PhD 02/20/2020, 2:47 PM [facility] [phone] or if no answer [phone]

## 2020-02-20 NOTE — Telephone Encounter (Signed)
I returned call. She wanted me to know patient is hospitalized and she is thankful for that.

## 2020-02-21 ENCOUNTER — Inpatient Hospital Stay (HOSPITAL_COMMUNITY): Payer: Medicare Other

## 2020-02-21 ENCOUNTER — Encounter (HOSPITAL_COMMUNITY): Payer: Self-pay | Admitting: Internal Medicine

## 2020-02-21 DIAGNOSIS — R112 Nausea with vomiting, unspecified: Secondary | ICD-10-CM | POA: Diagnosis not present

## 2020-02-21 DIAGNOSIS — E871 Hypo-osmolality and hyponatremia: Secondary | ICD-10-CM

## 2020-02-21 DIAGNOSIS — R630 Anorexia: Secondary | ICD-10-CM | POA: Diagnosis not present

## 2020-02-21 DIAGNOSIS — A310 Pulmonary mycobacterial infection: Secondary | ICD-10-CM

## 2020-02-21 DIAGNOSIS — J479 Bronchiectasis, uncomplicated: Secondary | ICD-10-CM | POA: Diagnosis not present

## 2020-02-21 DIAGNOSIS — E878 Other disorders of electrolyte and fluid balance, not elsewhere classified: Secondary | ICD-10-CM

## 2020-02-21 LAB — BASIC METABOLIC PANEL
Anion gap: 10 (ref 5–15)
Anion gap: 10 (ref 5–15)
Anion gap: 10 (ref 5–15)
Anion gap: 9 (ref 5–15)
Anion gap: 8 (ref 5–15)
BUN: 7 mg/dL — ABNORMAL LOW (ref 8–23)
BUN: 7 mg/dL — ABNORMAL LOW (ref 8–23)
BUN: 5 mg/dL — ABNORMAL LOW (ref 8–23)
BUN: 5 mg/dL — ABNORMAL LOW (ref 8–23)
BUN: <5 mg/dL — ABNORMAL LOW (ref 8–23)
CO2: 20 mmol/L — ABNORMAL LOW (ref 22–32)
CO2: 22 mmol/L (ref 22–32)
CO2: 21 mmol/L — ABNORMAL LOW (ref 22–32)
CO2: 23 mmol/L (ref 22–32)
CO2: 22 mmol/L (ref 22–32)
Calcium: 8.2 mg/dL — ABNORMAL LOW (ref 8.9–10.3)
Calcium: 8.4 mg/dL — ABNORMAL LOW (ref 8.9–10.3)
Calcium: 7.9 mg/dL — ABNORMAL LOW (ref 8.9–10.3)
Calcium: 8.3 mg/dL — ABNORMAL LOW (ref 8.9–10.3)
Calcium: 8 mg/dL — ABNORMAL LOW (ref 8.9–10.3)
Chloride: 89 mmol/L — ABNORMAL LOW (ref 98–111)
Chloride: 89 mmol/L — ABNORMAL LOW (ref 98–111)
Chloride: 92 mmol/L — ABNORMAL LOW (ref 98–111)
Chloride: 91 mmol/L — ABNORMAL LOW (ref 98–111)
Chloride: 92 mmol/L — ABNORMAL LOW (ref 98–111)
Creatinine, Ser: 0.47 mg/dL (ref 0.44–1.00)
Creatinine, Ser: 0.52 mg/dL (ref 0.44–1.00)
Creatinine, Ser: 0.5 mg/dL (ref 0.44–1.00)
Creatinine, Ser: 0.51 mg/dL (ref 0.44–1.00)
Creatinine, Ser: 0.46 mg/dL (ref 0.44–1.00)
GFR calc Af Amer: >60 mL/min (ref >60)
GFR calc Af Amer: >60 mL/min (ref >60)
GFR calc Af Amer: >60 mL/min (ref >60)
GFR calc Af Amer: >60 mL/min (ref >60)
GFR calc Af Amer: >60 mL/min (ref >60)
GFR calc non Af Amer: >60 mL/min (ref >60)
GFR calc non Af Amer: >60 mL/min (ref >60)
GFR calc non Af Amer: >60 mL/min (ref >60)
GFR calc non Af Amer: >60 mL/min (ref >60)
GFR calc non Af Amer: >60 mL/min (ref >60)
Glucose, Bld: 101 mg/dL — ABNORMAL HIGH (ref 70–99)
Glucose, Bld: 111 mg/dL — ABNORMAL HIGH (ref 70–99)
Glucose, Bld: 93 mg/dL (ref 70–99)
Glucose, Bld: 91 mg/dL (ref 70–99)
Glucose, Bld: 128 mg/dL — ABNORMAL HIGH (ref 70–99)
Potassium: 3.7 mmol/L (ref 3.5–5.1)
Potassium: 3.9 mmol/L (ref 3.5–5.1)
Potassium: 4.5 mmol/L (ref 3.5–5.1)
Potassium: 4.4 mmol/L (ref 3.5–5.1)
Potassium: 3.9 mmol/L (ref 3.5–5.1)
Sodium: 119 mmol/L — CL (ref 135–145)
Sodium: 121 mmol/L — ABNORMAL LOW (ref 135–145)
Sodium: 123 mmol/L — ABNORMAL LOW (ref 135–145)
Sodium: 123 mmol/L — ABNORMAL LOW (ref 135–145)
Sodium: 122 mmol/L — ABNORMAL LOW (ref 135–145)

## 2020-02-21 LAB — CBC WITH DIFFERENTIAL/PLATELET
Abs Immature Granulocytes: 0.12 10*3/uL — ABNORMAL HIGH (ref 0.00–0.07)
Basophils Absolute: 0 10*3/uL (ref 0.0–0.1)
Basophils Relative: 0 %
Eosinophils Absolute: 0.1 10*3/uL (ref 0.0–0.5)
Eosinophils Relative: 1 %
HCT: 26.4 % — ABNORMAL LOW (ref 36.0–46.0)
Hemoglobin: 8.3 g/dL — ABNORMAL LOW (ref 12.0–15.0)
Immature Granulocytes: 1 %
Lymphocytes Relative: 6 %
Lymphs Abs: 0.5 10*3/uL — ABNORMAL LOW (ref 0.7–4.0)
MCH: 25.4 pg — ABNORMAL LOW (ref 26.0–34.0)
MCHC: 31.4 g/dL (ref 30.0–36.0)
MCV: 80.7 fL (ref 80.0–100.0)
Monocytes Absolute: 0.7 10*3/uL (ref 0.1–1.0)
Monocytes Relative: 7 %
Neutro Abs: 7.9 10*3/uL — ABNORMAL HIGH (ref 1.7–7.7)
Neutrophils Relative %: 85 %
Platelets: 358 10*3/uL (ref 150–400)
RBC: 3.27 MIL/uL — ABNORMAL LOW (ref 3.87–5.11)
RDW: 17.2 % — ABNORMAL HIGH (ref 11.5–15.5)
WBC: 9.3 10*3/uL (ref 4.0–10.5)
nRBC: 0 % (ref 0.0–0.2)

## 2020-02-21 LAB — IRON AND TIBC
Iron: 34 ug/dL (ref 28–170)
Saturation Ratios: 19 % (ref 10.4–31.8)
TIBC: 176 ug/dL — ABNORMAL LOW (ref 250–450)
UIBC: 142 ug/dL

## 2020-02-21 LAB — URINALYSIS, ROUTINE W REFLEX MICROSCOPIC
Bilirubin Urine: NEGATIVE
Glucose, UA: NEGATIVE mg/dL
Hgb urine dipstick: NEGATIVE
Ketones, ur: 20 mg/dL — AB
Leukocytes,Ua: NEGATIVE
Nitrite: NEGATIVE
Protein, ur: NEGATIVE mg/dL
Specific Gravity, Urine: 1.03 (ref 1.005–1.030)
pH: 6 (ref 5.0–8.0)

## 2020-02-21 LAB — TSH: TSH: 1.58 u[IU]/mL (ref 0.350–4.500)

## 2020-02-21 LAB — SODIUM, URINE, RANDOM: Sodium, Ur: 77 mmol/L

## 2020-02-21 LAB — OSMOLALITY, URINE: Osmolality, Ur: 422 mOsm/kg (ref 300–900)

## 2020-02-21 LAB — HIV ANTIBODY (ROUTINE TESTING W REFLEX): HIV Screen 4th Generation wRfx: NONREACTIVE

## 2020-02-21 LAB — MAGNESIUM
Magnesium: 1.6 mg/dL — ABNORMAL LOW (ref 1.7–2.4)
Magnesium: 1.7 mg/dL (ref 1.7–2.4)

## 2020-02-21 LAB — VITAMIN B12: Vitamin B-12: 272 pg/mL (ref 180–914)

## 2020-02-21 LAB — OSMOLALITY: Osmolality: 253 mOsm/kg — ABNORMAL LOW (ref 275–295)

## 2020-02-21 LAB — FOLATE: Folate: 14.6 ng/mL (ref >5.9)

## 2020-02-21 LAB — LIPASE, BLOOD: Lipase: 32 U/L (ref 11–51)

## 2020-02-21 MED ORDER — SODIUM CHLORIDE 0.9 % IV SOLN: INTRAVENOUS | Status: AC

## 2020-02-21 MED ORDER — PANTOPRAZOLE SODIUM 40 MG IV SOLR
40.0000 mg | INTRAVENOUS | Status: DC
  Administered 2020-02-21 – 2020-02-23 (×3): 40 mg via INTRAVENOUS
  Filled 2020-02-21 (×3): qty 40

## 2020-02-21 MED ORDER — SODIUM CHLORIDE 3 % IN NEBU
4.0000 mL | INHALATION_SOLUTION | Freq: Two times a day (BID) | RESPIRATORY_TRACT | Status: AC
  Administered 2020-02-21 – 2020-02-23 (×5): 4 mL via RESPIRATORY_TRACT
  Filled 2020-02-21 (×5): qty 4

## 2020-02-21 NOTE — Progress Notes (Signed)
Patient ID: Anna Valentine, female   DOB: 07-Apr-1951, 69 y.o.   MRN: [id number]  PROGRESS NOTE    ANJOLINA BYRER  YHC:[id number] DOB: Jan 11, 1951 DOA: 02/20/2020 PCP: [name], MD   Brief Narrative:  69 year old female with past medical history of Mycobacterium avium complex (Dx 12/2018, follows with Dr. Lamonte Sakai) currently being treated with ethambutol, rifampin and azithromycin, history of breast cancer status post bilateral mastectomies, adenocarcinoma of the right lung status post right lower lobe lobectomy, bronchiectasis, hypertension, chronic diastolic congestive heart failure who presents to [facility] emergency department after being sent by her pulmonologist Dr. Lamonte Sakai.  Patient presented to Dr. Agustina Caroli office on 02/20/2020 for follow-up and was complaining of intermittent nausea with vomiting for several weeks with very poor oral intake and approximately 15 pound weight loss recently.  She was found to have sodium of 119 for which she was asked to go to the ED.  She was started on IV fluids.  Assessment & Plan:   Hyponatremia Dehydration -Presented with sodium of 119; probably from poor oral intake and dehydration -Currently on IV fluids.  Sodium 123 this morning.  Continue IV fluids.  Intermittent intractable nausea and vomiting -Patient is currently on treatment for MAC with ethambutol, Zithromax and rifampin.  Unclear if her symptoms are because of intolerance to these medications.  LFTs seem normal. - will get right upper quadrant ultrasound -Continue antiemetics. -Add Protonix  Acute metabolic encephalopathy -Probably from hyponatremia.  Monitor mental status.  Folic acid.  MAC infection Bronchiectasis -Diagnosed in June 2020 via endoscopy, follows with Dr. Lamonte Sakai with Pulmonology and Dr. Megan Salon with ID -Per pulmonology last note, if patient continues to not be able to tolerate medications due to severe nausea and surgical options may need to be considered -CT  of the chest with contrast shows chronic changes with multiple cystic cavitary lesions, bronchiectasis.  Consult pulmonary -Currently rifampin, ethambutol and Zithromax being continued.  Will consult ID as well.  ID recommends to hold neb treatment till ID evaluation.  Chronic diastolic CHF -Currently compensated.  No evidence of volume overload.  Strict input and output.  Daily weights.   Anemia of chronic disease -Probably from her chronic illnesses.  Hemoglobin stable.  No signs of bleeding.  Monitor  Essential hypertension -Blood pressure currently stable.  Continue monitoring.  History of breast cancer status post bilateral mastectomies -Continue tamoxifen.  Outpatient follow-up with oncology  History of lung cancer status post right lower lobe lobectomy -Follow pulmonary recommendations.  Outpatient follow-up with oncology   DVT prophylaxis: Lovenox Code Status: Full Family Communication: Patient at bedside Disposition Plan: Status is: Inpatient  Remains inpatient appropriate because:Inpatient level of care appropriate due to severity of illness   Dispo: The patient is from: Home              Anticipated d/c is to: Home              Anticipated d/c date is: > 3 days              Patient currently is not medically stable to d/c.  Consultants: Pulmonary  Procedures: None  Antimicrobials:  Anti-infectives (From admission, onward)   Start     Dose/Rate Route Frequency Ordered Stop   02/21/20 1000  azithromycin (ZITHROMAX) tablet 500 mg     Discontinue     500 mg Oral Daily 02/20/20 2135     02/21/20 1000  ethambutol (MYAMBUTOL) tablet 600 mg     Discontinue  600 mg Oral Daily 02/20/20 2135     02/21/20 1000  ethambutol (MYAMBUTOL) tablet 400 mg  Status:  Discontinued       Note to Pharmacy: Take with two 100 mg tablets to equal 600 mg total.     400 mg Oral Daily 02/20/20 2135 02/20/20 2333   02/21/20 1000  rifampin (RIFADIN) capsule 600 mg     Discontinue     600  mg Oral Daily 02/20/20 2135     02/20/20 2330  acyclovir (ZOVIRAX) tablet 400 mg     Discontinue     400 mg Oral 2 times daily 02/20/20 2135        Subjective: Patient seen and examined at bedside.  Still complains of intermittent nausea.  No overnight fever, vomiting or worsening shortness of breath reported.  No vomiting since admission.  Objective: Vitals:   02/20/20 2345 02/21/20 0014 02/21/20 0052 02/21/20 0445  BP: 140/85  139/76 138/74  Pulse: 83  92 91  Resp: (!) _0 Temp:  98.2 F (36.8 C) 98.1 F (36.7 C) 98.2 F (36.8 C)  TempSrc:   Oral Oral  SpO2: 100%  97% 98%  Weight:      Height:        Intake/Output Summary (Last 24 hours) at 02/21/2020 0803 Last data filed at 02/21/2020 0447 Gross per 24 hour  Intake 1366.73 ml  Output --  Net 1366.73 ml   Filed Weights   02/20/20 1600  Weight: 44.9 kg    Examination:  General exam: Appears calm and comfortable.  Looks chronically ill and thinly built Respiratory system: Bilateral decreased breath sounds at bases with some scattered crackles Cardiovascular system: S1 & S2 heard, Rate controlled Gastrointestinal system: Abdomen is nondistended, soft and nontender. Normal bowel sounds heard. Extremities: No cyanosis, clubbing, edema  Central nervous system: Alert and oriented. No focal neurological deficits. Moving extremities Skin: No rashes, lesions or ulcers Psychiatry: Looks anxious    Data Reviewed: I have personally reviewed following labs and imaging studies  CBC: Recent Labs  Lab 02/17/20 1638 02/20/20 1612 02/21/20 0540  WBC 10.7* 9.1 9.3  NEUTROABS 9.3*  --  7.9*  HGB 10.4* 9.7* 8.3*  HCT 33.3* 31.1* 26.4*  MCV 81.6 81.6 80.7  PLT 370 431* 056   Basic Metabolic Panel: Recent Labs  Lab 02/20/20 0931 02/20/20 1612 02/20/20 2130 02/21/20 0130 02/21/20 0540  NA 119* 119* 119* 121* 123*  K 3.2* 3.4* 3.7 3.9 4.5  CL 85* 86* 89* 89* 92*  CO2 26 22 20* 22 21*  GLUCOSE 115* 123* 101*  111* 93  BUN 8 6* 7* 7* 5*  CREATININE 0.53 0.49 0.47 0.52 0.50  CALCIUM 8.7 8.7* 8.2* 8.4* 7.9*  MG  --   --  1.6*  --  1.7   GFR: Estimated Creatinine Clearance: 47.7 mL/min (by C-G formula based on SCr of 0.5 mg/dL). Liver Function Tests: Recent Labs  Lab 02/17/20 1638 02/20/20 1921  AST 55* 29  ALT 57* 43  ALKPHOS 113 83  BILITOT 1.0 0.6  PROT 7.7 6.3*  ALBUMIN 2.9* 2.6*   Recent Labs  Lab 02/20/20 2130  LIPASE 32   No results for input(s): AMMONIA in the last 168 hours. Coagulation Profile: No results for input(s): INR, PROTIME in the last 168 hours. Cardiac Enzymes: No results for input(s): CKTOTAL, CKMB, CKMBINDEX, TROPONINI in the last 168 hours. BNP (last 3 results) No results for input(s): PROBNP in the last 8760  hours. HbA1C: No results for input(s): HGBA1C in the last 72 hours. CBG: No results for input(s): GLUCAP in the last 168 hours. Lipid Profile: No results for input(s): CHOL, HDL, LDLCALC, TRIG, CHOLHDL, LDLDIRECT in the last 72 hours. Thyroid Function Tests: Recent Labs    02/20/20 2230  TSH 1.580   Anemia Panel: Recent Labs    02/20/20 2119 02/20/20 2130  VITAMINB12  --  272  FOLATE 14.6  --   TIBC  --  176*  IRON  --  34   Sepsis Labs: No results for input(s): PROCALCITON, LATICACIDVEN in the last 168 hours.  Recent Results (from the past 240 hour(s))  SARS Coronavirus 2 by RT PCR (hospital order, performed in [facility] hospital lab) Nasopharyngeal Nasopharyngeal Swab     Status: None   Collection Time: 02/20/20  8:23 PM   Specimen: Nasopharyngeal Swab  Result Value Ref Range Status   SARS Coronavirus 2 NEGATIVE NEGATIVE Final    Comment: (NOTE) SARS-CoV-2 target nucleic acids are NOT DETECTED.  The SARS-CoV-2 RNA is generally detectable in upper and lower respiratory specimens during the acute phase of infection. The lowest concentration of SARS-CoV-2 viral copies this assay can detect is 250 copies / mL. A negative result  does not preclude SARS-CoV-2 infection and should not be used as the sole basis for treatment or other patient management decisions.  A negative result may occur with improper specimen collection / handling, submission of specimen other than nasopharyngeal swab, presence of viral mutation(s) within the areas targeted by this assay, and inadequate number of viral copies (<250 copies / mL). A negative result must be combined with clinical observations, patient history, and epidemiological information.  Fact Sheet for Patients:   StrictlyIdeas.no  Fact Sheet for Healthcare Providers: BankingDealers.co.za  This test is not yet approved or  cleared by the [place] FDA and has been authorized for detection and/or diagnosis of SARS-CoV-2 by FDA under an Emergency Use Authorization (EUA).  This EUA will remain in effect (meaning this test can be used) for the duration of the COVID-19 declaration under Section 564(b)(1) of the Act, 21 U.S.C. section 360bbb-3(b)(1), unless the authorization is terminated or revoked sooner.  Performed at [facility], [place] [address], [place], [place] [address]          Radiology Studies: DG Chest 2 View  Result Date: 02/20/2020 CLINICAL DATA:  Shortness of breath EXAM: CHEST - 2 VIEW COMPARISON:  01/08/2020.  12/02/2019. FINDINGS: Improvement compared to the study 01/08/2020. Background pattern of emphysema of with bolus disease more extensive on the right than the left. Widespread pulmonary infiltrate previously seen on the right has improved, but there are some persistent areas of density that could be some ongoing pneumonia. Small amount of pleural fluid present on the right. No acute bone finding. IMPRESSION: Improvement of widespread pulmonary infiltrate on the right. Some persistent areas of density could be ongoing pneumonia. Small right effusion. Background pattern emphysema with severe bullous  disease on the right Electronically Signed   By: Nelson Chimes M.D.   On: 02/20/2020 16:04   DG Lumbar Spine 2-3 Views  Result Date: 02/20/2020 CLINICAL DATA:  Low back pain EXAM: LUMBAR SPINE - 2-3 VIEW COMPARISON:  None. FINDINGS: Degenerative facet disease in the lower lumbar spine. Disc space narrowing at L5-S1. No fracture or subluxation. SI joints symmetric and unremarkable. IMPRESSION: Degenerative changes.  No acute bony abnormality. Electronically Signed   By: Rolm Baptise M.D.   On:  02/20/2020 22:14   CT CHEST W CONTRAST  Result Date: 02/20/2020 CLINICAL DATA:  69 year old female with non-small cell lung cancer presenting with weight loss and shortness of breath. EXAM: CT CHEST WITH CONTRAST TECHNIQUE: Multidetector CT imaging of the chest was performed during intravenous contrast administration. CONTRAST:  65m OMNIPAQUE IOHEXOL 300 MG/ML  SOLN COMPARISON:  Chest CT dated 12/18/2019. FINDINGS: Cardiovascular: There is no cardiomegaly or pericardial effusion. There is coronary vascular calcification. Mild atherosclerotic calcification of the thoracic aorta. No aneurysmal dilatation or dissection. The origins of the great vessels of the aortic arch appear patent. No pulmonary artery embolus identified. Mediastinum/Nodes: Mildly enlarged mediastinal lymph node measures 13 mm in short axis anterior to the carina. The esophagus is grossly unremarkable. No mediastinal fluid collection. Lungs/Pleura: There is postoperative changes of right lower lobectomy with associated volume loss and shift of the mediastinum into the right hemithorax. The previously described masslike consolidation in the right middle lobe appears less solid on today's exam and has been replaced by areas of cavitation and cystic changes. This likely represented an infected cavitary lesion. There is diffuse tubular bronchiectasis of the right lung. There is a mildly lobulated cavitary lesion extending from the posterior aspect of the  right upper lobe inferiorly along the fissure. Several additional cavitary lesions noted throughout the right lung which appear progressed since the prior CT. An area of subpleural consolidation at the right lung base again noted similar to prior CT which may represent atelectasis or postoperative scarring versus pneumonia. There is diffuse scattered pulmonary nodules in the right lung which may be inflammatory or infectious in etiology. Metastatic disease is favored less likely but not excluded clinical correlation is recommended. Several small scattered nodules measuring 3-4 mm noted in the left lower lobe, likely post inflammatory. A cluster of small nodular densities in the lingula with a small cavitary focus noted which appears similar to prior CT. There is no pneumothorax. Secretin is noted in the trachea and right mainstem bronchus. Upper Abdomen: There is a 2 cm right renal cyst. Musculoskeletal: There is loss of subcutaneous fat and cachexia. No acute osseous pathology. IMPRESSION: 1. Postsurgical changes of right lower lobectomy with associated volume loss and shift of the mediastinum into the right hemithorax. 2. The previously described masslike consolidation in the right middle lobe appears less solid on today's exam and has been replaced by areas of cavitation and cystic changes. This likely represented an infected an impacted cavitary lesion. Continued follow-up recommended. 3. Multiple cystic and cavitary lesions throughout the right lung as well as diffuse right lung bronchiectasis. 4. Diffuse scattered pulmonary nodules in the right lung may be inflammatory or infectious in etiology. Metastatic disease is favored less likely but not excluded. Clinical correlation is recommended. 5. An area of subpleural consolidation at the right lung base again noted similar to prior CT which may represent atelectasis or postoperative scarring versus pneumonia. 6. Aortic Atherosclerosis (ICD10-I70.0).  Electronically Signed   By: AAnner CreteM.D.   On: 02/20/2020 22:39   DG Abd 2 Views  Result Date: 02/20/2020 CLINICAL DATA:  69year old female with low back pain.  Lung cancer. EXAM: ABDOMEN - 2 VIEW COMPARISON:  PET-CT 12/31/2019. FINDINGS: Upright and supine views of the abdomen and pelvis. No pneumoperitoneum. Partially visible abnormal right lung, not significantly changed from the scout view of the PET-CT. Non obstructed bowel gas pattern. Chronic probable fibroid related dystrophic calcification in the right hemipelvis. Superimposed vascular calcifications in the abdomen and pelvis. No acute osseous  abnormality identified. IMPRESSION: 1.  Normal bowel gas pattern, no free air. 2. Partially visible abnormal right lung appears grossly stable since June. Electronically Signed   By: Genevie Ann M.D.   On: 02/20/2020 22:15        Scheduled Meds: . acyclovir  400 mg Oral BID  . azithromycin  500 mg Oral Daily  . enoxaparin (LOVENOX) injection  40 mg Subcutaneous Q24H  . ethambutol  600 mg Oral Daily  . gabapentin  300 mg Oral QHS  . mirtazapine  7.5 mg Oral QHS  . rifampin  600 mg Oral Daily  . tamoxifen  20 mg Oral Daily   Continuous Infusions: . sodium chloride 100 mL/hr at 02/21/20 0104          Aline August, MD [facility] 02/21/2020, 8:03 AM

## 2020-02-21 NOTE — ED Notes (Signed)
Admitting Dr. Vicente Serene informed critical sodium 119. RN to encourage oral intake. Pending IV fluids   Nausea medicine given. PO Meds holding for improvement

## 2020-02-21 NOTE — Evaluation (Signed)
Physical Therapy Evaluation Patient Details Name: Anna Valentine MRN: [id number] DOB: [birth year] Today's Date: 02/21/2020   History of Present Illness  Pt is 69 year old female with past medical history of Mycobacterium avium complex (Dx 12/2018, follows with Dr. Lamonte Sakai) currently being treated with ethambutol, rifampin and azithromycin, history of breast cancer status post bilateral mastectomies, adenocarcinoma of the right lung status post right lower lobe lobectomy, bronchiectasis, hypertension, chronic diastolic congestive heart failure who presents to [facility] emergency department after being sent by her pulmonologist Dr. Lamonte Sakai.  Pt admitted with hyponatremia and intractible N/V.  Clinical Impression  Pt admitted with above dx.  She was able to safely transfer and ambulate in room independently or with supervision for lines.  Pt scored a 52 on BBS indicating low fall risk.  She reports she does feel a little week but doing better today. Recommend mobility with nursing or transfers independently if unhooked from IV.  No skilled PT needs.     Follow Up Recommendations No PT follow up    Equipment Recommendations  None recommended by PT    Recommendations for Other Services       Precautions / Restrictions Precautions Precautions: None Restrictions Weight Bearing Restrictions: No      Mobility  Bed Mobility Overal bed mobility: Independent                Transfers Overall transfer level: Needs assistance Equipment used: None Transfers: Sit to/from Stand Sit to Stand: Supervision         General transfer comment: sit to stand from bed and toilet; independent with toielting ADLs  Ambulation/Gait Ambulation/Gait assistance: Supervision Gait Distance (Feet): 80 Feet Assistive device: None Gait Pattern/deviations: WFL(Within Functional Limits)     General Gait Details: mild decrease in speed but otherwise normal  Stairs            Wheelchair  Mobility    Modified Rankin (Stroke Patients Only)       Balance Overall balance assessment: Independent                               Standardized Balance Assessment Standardized Balance Assessment : Berg Balance Test Berg Balance Test Sit to Stand: Able to stand without using hands and stabilize independently Standing Unsupported: Able to stand safely 2 minutes Sitting with Back Unsupported but Feet Supported on Floor or Stool: Able to sit safely and securely 2 minutes Stand to Sit: Sits safely with minimal use of hands Transfers: Able to transfer safely, minor use of hands Standing Unsupported with Eyes Closed: Able to stand 10 seconds safely Standing Ubsupported with Feet Together: Able to place feet together independently and stand 1 minute safely From Standing, Reach Forward with Outstretched Arm: Can reach forward >12 cm safely (5") From Standing Position, Pick up Object from Floor: Able to pick up shoe safely and easily From Standing Position, Turn to Look Behind Over each Shoulder: Looks behind from both sides and weight shifts well Turn 360 Degrees: Able to turn 360 degrees safely but slowly Standing Unsupported, Alternately Place Feet on Step/Stool: Able to stand independently and safely and complete 8 steps in 20 seconds Standing Unsupported, One Foot in Front: Able to place foot tandem independently and hold 30 seconds Standing on One Leg: Able to lift leg independently and hold 5-10 seconds Total Score: 52         Pertinent Vitals/Pain Pain Assessment: No/denies pain  Home Living Family/patient expects to be discharged to:: Private residence Living Arrangements: Alone Available Help at Discharge: Family;Friend(s);Available PRN/intermittently Type of Home: House Home Access: Stairs to enter Entrance Stairs-Rails: Right Entrance Stairs-Number of Steps: 3 Home Layout: One level Home Equipment: None      Prior Function Level of Independence:  Independent               Hand Dominance        Extremity/Trunk Assessment   Upper Extremity Assessment Upper Extremity Assessment: Overall WFL for tasks assessed    Lower Extremity Assessment Lower Extremity Assessment: Overall WFL for tasks assessed    Cervical / Trunk Assessment Cervical / Trunk Assessment: Normal  Communication   Communication: No difficulties  Cognition Arousal/Alertness: Awake/alert Behavior During Therapy: WFL for tasks assessed/performed Overall Cognitive Status: Within Functional Limits for tasks assessed                                        General Comments      Exercises     Assessment/Plan    PT Assessment Patent does not need any further PT services  PT Problem List         PT Treatment Interventions      PT Goals (Current goals can be found in the Care Plan section)  Acute Rehab PT Goals Patient Stated Goal: return home PT Goal Formulation: With patient Time For Goal Achievement: 03/06/20 Potential to Achieve Goals: Good    Frequency     Barriers to discharge        Co-evaluation               AM-PAC PT "6 Clicks" Mobility  Outcome Measure Help needed turning from your back to your side while in a flat bed without using bedrails?: None Help needed moving from lying on your back to sitting on the side of a flat bed without using bedrails?: None Help needed moving to and from a bed to a chair (including a wheelchair)?: None Help needed standing up from a chair using your arms (e.g., wheelchair or bedside chair)?: None Help needed to walk in hospital room?: None Help needed climbing 3-5 steps with a railing? : None 6 Click Score: 24    End of Session   Activity Tolerance: Patient tolerated treatment well Patient left: in chair;with call bell/phone within reach Nurse Communication: Mobility status (Can transfer independently if unhooked from IV)      Time: 1110-1131 PT Time Calculation  (min) (ACUTE ONLY): 21 min   Charges:   PT Evaluation $PT Eval Low Complexity: 1 Low          Anna Valentine, PT Acute Rehab Services Pager [phone] [facility] [phone]    Karlton Lemon 02/21/2020, 11:39 AM

## 2020-02-21 NOTE — Consult Note (Addendum)
NAME:  Anna Valentine, MRN:  [id number], DOB:  1951/02/07, LOS: 1 ADMISSION DATE:  02/20/2020, CONSULTATION DATE: 8/13 REFERRING MD:  Dr. Starla Link, CHIEF COMPLAINT:  Dizziness, N/V  Brief History   69 y/o F with MAI admitted 8/12 with hyponatremia, N/V.   History of present illness   69 y/o F who was admitted for hyponatremia, N/V.  She has a hx of breast cancer s/p lumpectomy / XRT / on tamoxifen, right sided NSCLCA s/p resection, bronchiectasis and known MAI.  She has previously been colonized with pseudomonas. From a pulmonary standpoint, she has significant bronchiectasis with structural abnormality and evolving pneumonia / cavitary lesions.  She had a bronchoscopy which was negative for malignancy but showed MAIC.  MAIC sensitivity data (01/08/2020): Amikacin susceptible, clarithromycin susceptible, linezolid, streptomycin and moxifloxacin resistant. She was started on 3 times weekly azithromycin, ethambutol, rifampin.  This was increased to 7 days per week per ID (Dr. Megan Salon).  The patient unfortunately has had sweats, weakness, fatigue.  She was seen in the ER on 8/9 due to the same and was found to have hyponatremia (Na 126).    The patient was seen in the pulmonary office on 8/12 with progressive dyspnea, back pain and nausea.  She reports little activity due to shortness of breath.  Labs were drawn in the office which revealed a serum Na of 119.  She was referred to the ER for evaluation.  The patient was admitted per [facility].  ID and PCCM consulted for evaluation.   Past Medical History  MAI  Stage I Poorly Differentiated Adenocarcinoma s/p Resection - 2006  Iatrogenic Pneumothorax Breast Cancer s/p lumpectomy, XRT.  BRCA I/II negative - 2011  GERD HTN  Chronic Diastolic CHF   Significant Hospital Events   8/12 Admit   Consults:  ID   Procedures:    Significant Diagnostic Tests:   CT Chest 8/12 >> post surgical changes in RLL with associated volume loss / shift of  mediastinum into the right hemithorax. Mass like consolidation previously seen on imaging is less solid and has been replaced by areas of cavitation and cystic changes, multiple cystic and cavitary lesions, diffuse bronchiectasis, diffuse scattered pulmonary nodules in the right lung, area of subpleural consolidation at right lung base   Micro Data:  COVID 8/12 >>   Antimicrobials:  Per ID   Interim history/subjective:  Patient reports she is less nauseated today  Denies fevers Productive cough   Objective   Blood pressure 122/79, pulse 76, temperature 98.1 F (36.7 C), temperature source Oral, resp. rate 17, height '5\' 6"'  (1.676 m), weight 44.9 kg, SpO2 99 %.        Intake/Output Summary (Last 24 hours) at 02/21/2020 1513 Last data filed at 02/21/2020 1423 Gross per 24 hour  Intake 2111.81 ml  Output --  Net 2111.81 ml   Filed Weights   02/20/20 1600  Weight: 44.9 kg    Examination: General: adult female lying in bed in NAD HEENT: MM pink/moist, anicteric, wearing glasses, good dentition  Neuro: AAOx4, speech clear, MAE  CV: s1s2 rrr, no m/r/g PULM: non-labored, rhonchi on right, clear on left  GI: soft, bsx4 active  Extremities: warm/dry, no edema  Skin: no rashes or lesions  Resolved Hospital Problem list     Assessment & Plan:   Hypovolemic Hyponatremia  -defer to primary MD  -NS at 100 ml/hr  -cautious correction, follow Na trend closely   Nausea / Vomiting  Question if this is related to  ethambutol / rifampin. LFT's wnl.  -diet as tolerated -PRN anti-emetics  -consider nutrition consult  -RUQ [place] per primary  -follow LFT's intermittently with MAI regimen   MAI Infection  With severe RUL, RML disease, architectural destruction and cavitation.  Followed by ID, on azithro, ethambutol, rifampin QD prior to admit.  She seems to have had improvement in imaging while on daily therapy based on my radiographic review.   -defer MAI regimen to ID > have discussed a  break in therapy given N/V  -follow intermittent CXR  -continue pulmonary hygiene - flutter valve for secretion clearance  -consider chest vest for home (if she does not already have one) -will need outpatient follow up with ID, pulmonary after discharge   Best practice:  Diet: as tolerated  Pain/Anxiety/Delirium protocol (if indicated): n/a  VAP protocol (if indicated): n/a  DVT prophylaxis: per primary  GI prophylaxis: per primary  Glucose control: per primary  Mobility: as tolerated  Code Status: Full Code  Family Communication: Patient updated on plan of care  Disposition: per [facility]   Labs   CBC: Recent Labs  Lab 02/17/20 1638 02/20/20 1612 02/21/20 0540  WBC 10.7* 9.1 9.3  NEUTROABS 9.3*  --  7.9*  HGB 10.4* 9.7* 8.3*  HCT 33.3* 31.1* 26.4*  MCV 81.6 81.6 80.7  PLT 370 431* 720    Basic Metabolic Panel: Recent Labs  Lab 02/20/20 2130 02/21/20 0130 02/21/20 0540 02/21/20 0838 02/21/20 1344  NA 119* 121* 123* 123* 122*  K 3.7 3.9 4.5 4.4 3.9  CL 89* 89* 92* 91* 92*  CO2 20* 22 21* 23 22  GLUCOSE 101* 111* 93 91 128*  BUN 7* 7* 5* 5* <5*  CREATININE 0.47 0.52 0.50 0.51 0.46  CALCIUM 8.2* 8.4* 7.9* 8.3* 8.0*  MG 1.6*  --  1.7  --   --    GFR: Estimated Creatinine Clearance: 47.7 mL/min (by C-G formula based on SCr of 0.46 mg/dL). Recent Labs  Lab 02/17/20 1638 02/20/20 1612 02/21/20 0540  WBC 10.7* 9.1 9.3    Liver Function Tests: Recent Labs  Lab 02/17/20 1638 02/20/20 1921  AST 55* 29  ALT 57* 43  ALKPHOS 113 83  BILITOT 1.0 0.6  PROT 7.7 6.3*  ALBUMIN 2.9* 2.6*   Recent Labs  Lab 02/20/20 2130  LIPASE 32   No results for input(s): AMMONIA in the last 168 hours.  ABG No results found for: PHART, PCO2ART, PO2ART, HCO3, TCO2, ACIDBASEDEF, O2SAT   Coagulation Profile: No results for input(s): INR, PROTIME in the last 168 hours.  Cardiac Enzymes: No results for input(s): CKTOTAL, CKMB, CKMBINDEX, TROPONINI in the last 168  hours.  HbA1C: No results found for: HGBA1C  CBG: No results for input(s): GLUCAP in the last 168 hours.  Review of Systems: Positives in [place]   Gen: Denies fever, chills, weight change, fatigue, night sweats HEENT: Denies blurred vision, double vision, hearing loss, tinnitus, sinus congestion, rhinorrhea, sore throat, neck stiffness, dysphagia PULM: Denies shortness of breath, cough, sputum production, hemoptysis, wheezing CV: Denies chest pain, edema, orthopnea, paroxysmal nocturnal dyspnea, palpitations GI: Denies abdominal pain, nausea, vomiting, poor intake, diarrhea, hematochezia, melena, constipation, change in bowel habits GU: Denies dysuria, hematuria, polyuria, oliguria, urethral discharge Endocrine: Denies hot or cold intolerance, polyuria, polyphagia or appetite change Derm: Denies rash, dry skin, scaling or peeling skin change Heme: Denies easy bruising, bleeding, bleeding gums Neuro: Denies headache, dizziness, numbness, weakness, slurred speech, loss of memory or consciousness  Past Medical History  She,  has a past medical history of Anemia, BRCA negative (2011), Breast cancer ([place]) (08/2009), CHF (congestive heart failure) ([place]), Chronic diastolic CHF (congestive heart failure) ([place]) (02/20/2020), Constipation, COPD (chronic obstructive pulmonary disease) ([place]), Emphysema of lung ([place]), Essential hypertension (02/20/2020), Family history of adverse reaction to anesthesia, Family history of breast cancer, Family history of colon cancer, Family history of lung cancer, GERD (gastroesophageal reflux disease), Hypertension, Iatrogenic pneumothorax (02/06/2019), Lung cancer ([place]) (06/06/05), Osteopenia (09/13/2013), Osteoporosis, Personal history of lung cancer, Pneumonia, STD (sexually transmitted disease), and Vitamin D deficiency (09/13/2013).   Surgical History    Past Surgical History:  Procedure Laterality Date  . BREAST BIOPSY    . BREAST LUMPECTOMY  10/12/2009   Left  lumpectomy and radiation, stage II, ER/PR+, Her 2 nu negative  . BUNIONECTOMY Bilateral   . COLONOSCOPY    . COLONOSCOPY    . LOBECTOMY Right 06/06/2005   [place]  . MASTECTOMY W/ SENTINEL NODE BIOPSY Bilateral 03/15/2018  . MASTECTOMY W/ SENTINEL NODE BIOPSY Bilateral 03/15/2018   Procedure: BILATERAL TOTAL MASTECTOMIES WITH RIGHT SENTINEL LYMPH NODE BIOPSY;  Surgeon: Rolm Bookbinder, MD;  Location: [facility];  Service: General;  Laterality: Bilateral;  . PORTACATH PLACEMENT N/A 03/15/2018   Procedure: INSERTION PORT-A-CATH WITH [place];  Surgeon: Rolm Bookbinder, MD;  Location: [facility];  Service: General;  Laterality: N/A;  . TONSILLECTOMY    . TUBAL LIGATION  1984  . VIDEO BRONCHOSCOPY  02/06/2019   Flexible video fiberoptic bronchoscopy with electromagnetic navigation and biopsies.  Marland Kitchen VIDEO BRONCHOSCOPY WITH ENDOBRONCHIAL NAVIGATION Left 02/06/2019   Procedure: VIDEO BRONCHOSCOPY WITH ENDOBRONCHIAL NAVIGATION, left lung;  Surgeon: Collene Gobble, MD;  Location: [facility];  Service: Thoracic;  Laterality: Left;  Marland Kitchen VIDEO BRONCHOSCOPY WITH ENDOBRONCHIAL NAVIGATION N/A 01/08/2020   Procedure: VIDEO BRONCHOSCOPY WITH ENDOBRONCHIAL NAVIGATION;  Surgeon: Collene Gobble, MD;  Location: [facility];  Service: Thoracic;  Laterality: N/A;     Social History   reports that she quit smoking about 32 years ago. She has a 36.00 pack-year smoking history. She has never used smokeless tobacco. She reports current alcohol use of about 14.0 standard drinks of alcohol per week. She reports that she does not use drugs.   Family History   Her family history includes Allergies in her mother; Asthma in her mother; Breast cancer in her cousin; Breast cancer (age of onset: 77) in her sister; Breast cancer (age of onset: 4) in her sister; Breast cancer (age of onset: 22) in her mother; Breast cancer (age of onset: 48) in her maternal grandmother; Colon cancer in her maternal aunt; Colon cancer (age of onset: 42) in her father; Colon  polyps in her sister; Leukemia in her maternal grandfather and sister; Lung cancer in her maternal aunt and mother; Prostate cancer (age of onset: 47) in her brother; Prostate cancer (age of onset: 66) in her brother. There is no history of Esophageal cancer, Rectal cancer, or Stomach cancer.   Allergies Allergies  Allergen Reactions  . Taxol [Paclitaxel] Anaphylaxis  . Tape Itching  . Abraxane [Paclitaxel Protein-Bound Part] Rash  . Clarithromycin Rash     Home Medications  Prior to Admission medications   Medication Sig Start Date End Date Taking? Authorizing Provider  acyclovir (ZOVIRAX) 400 MG tablet Take 1 tablet (400 mg total) by mouth 2 (two) times daily. 01/08/20  Yes Collene Gobble, MD  albuterol (VENTOLIN HFA) 108 (90 Base) MCG/ACT inhaler Inhale 2 puffs into the lungs every 6 (six) hours as needed  for wheezing or shortness of breath. 12/02/19  Yes Baxley, Cresenciano Lick, MD  azithromycin (ZITHROMAX) 500 MG tablet Take 1 tablet (500 mg total) by mouth daily. 02/12/20  Yes Kuppelweiser, Cassie L, RPH-CPP  ethambutol (MYAMBUTOL) 100 MG tablet Take 2 tablets (200 mg total) by mouth daily. Take with one 400 mg tablet to equal 600 mg total. 02/12/20  Yes Kuppelweiser, Cassie L, RPH-CPP  ethambutol (MYAMBUTOL) 400 MG tablet Take 1 tablet (400 mg total) by mouth daily. Take with two 100 mg tablets to equal 600 mg total. 02/12/20  Yes Kuppelweiser, Cassie L, RPH-CPP  Ferrous Sulfate (IRON) 28 MG TABS Take 28 mg by mouth daily.    Yes [provider]  folic acid (FOLVITE) 1 MG tablet Take 1 tablet (1 mg total) by mouth daily. 02/05/20  Yes Magrinat, Virgie Dad, MD  gabapentin (NEURONTIN) 300 MG capsule Take 1 capsule (300 mg total) by mouth at bedtime. 02/05/20  Yes Magrinat, Virgie Dad, MD  HYDROcodone-acetaminophen (NORCO) 5-325 MG tablet Take 1 tablet by mouth every 8 (eight) hours as needed for up to 5 days for moderate pain (Take with food and take anti nausea med one half hour prior to taking pain  medication). 02/19/20 02/24/20 Yes Baxley, Cresenciano Lick, MD  ibuprofen (ADVIL) 200 MG tablet Take 200 mg by mouth every 6 (six) hours as needed for moderate pain.    Yes [provider]  mirtazapine (REMERON) 7.5 MG tablet Take 1 tablet (7.5 mg total) by mouth at bedtime. 02/05/20  Yes Magrinat, Virgie Dad, MD  Multiple Vitamins-Minerals (MULTIVITAMIN WITH MINERALS) tablet Take 1 tablet by mouth daily.   Yes [provider]  ondansetron (ZOFRAN) 4 MG tablet Take 2 tablets (8 mg total) by mouth every 6 (six) hours as needed for nausea or vomiting. 02/20/20  Yes Byrum, Rose Fillers, MD  prochlorperazine (COMPAZINE) 5 MG tablet Take 2 tablets (10 mg total) by mouth every 6 (six) hours as needed for nausea or vomiting. 02/05/20  Yes Magrinat, Virgie Dad, MD  Psyllium (METAMUCIL PO) Take 1 Scoop by mouth daily as needed (constipation).     Yes [provider]  rifampin (RIFADIN) 300 MG capsule Take 2 capsules (600 mg total) by mouth daily. 02/14/20  Yes Kuppelweiser, Cassie L, RPH-CPP  tamoxifen (NOLVADEX) 20 MG tablet Take 20 mg by mouth at bedtime.  02/05/20  Yes [provider]  vitamin B-12 (CYANOCOBALAMIN) 500 MCG tablet Take 500 mcg by mouth daily.    Yes [provider]     Critical care time: n/a    Noe Gens, MSN, NP-C [facility] 02/21/2020, 3:13 PM   Please see [email or web address] for pager details.

## 2020-02-21 NOTE — Consult Note (Addendum)
[place] for Infectious Disease    Date of Admission:  02/20/2020     Total days of antibiotics                Reason for Consult: Severe nausea r/t mycobacterium Rx   Referring Provider: Starla Link Primary Care Provider: [name], MD   Assessment: Anna Valentine is a 69 y.o. female here for severe anorexia and vomiting with electrolyte derangement as a result of her regimen for mycobacterium avium infection. She has had some improvement following IVF and supportive care for nausea. Would consider nutritional consultation to help augment supplementation given weight loss and discuss options for protein supplementation.   While she did have some clinical improvement on daily treatment, she has struggled with tolerating her regimen. We discussed holding off on treatment for 1-2 weeks to allow her time to recover and correct nutrition. Will need to work on possible anti-emetics to premedicate with outpatient and maybe phasing in daily therapy to help with tolerability.   She has a follow up appointment with Dr. Megan Salon on 8/30 arranged.     Plan: 1. Continue anti-emetics  2. Hold on resuming azithromycin, ethambutol and rifabutin until follow up with Dr. Megan Salon  3. Consider nutritional consultation  4. Would be helpful to resume pulmonary hygiene with flutter valve +/- hypertonic saline as directed by pulmonology  5. ?need to consider surgical resection if she cannot tolerate therapies with re-trial.     Principal Problem:   Dehydration with hyponatremia Active Problems:   Iron deficiency anemia   Mycobacterium avium infection (HCC)   Intractable nausea and vomiting   Low back pain   Chronic diastolic CHF (congestive heart failure) (HCC)   Essential hypertension   Acute metabolic encephalopathy   . acyclovir  400 mg Oral BID  . enoxaparin (LOVENOX) injection  40 mg Subcutaneous Q24H  . gabapentin  300 mg Oral QHS  . mirtazapine  7.5 mg Oral QHS  .  pantoprazole (PROTONIX) IV  40 mg Intravenous Q24H  . tamoxifen  20 mg Oral Daily    HPI: Anna Valentine is a 69 y.o. female admitted for care following severe anorexia and vomiting due to antibiotics.   She has a history of mycobacterium avium pneumonia seen in our ID clinic with Dr. Megan Salon. July 14th she was started on 3x weekly ethambutol, azithromycin and rifampin.  At follow up on 8/02 due to the significant disease and architectural destruction with cavitations it was decided to initiate daily therapy.  Since this she has had a very poor appetite and feels very nauseated with any PO intake. Was trying to take in ensure but the texture and sweetness was off putting to her. She has had a 15 lb weight loss over the last few months; she lost her husband very recently.  She has been staying at her home and has assistance daily from a close friend who has been "at her side the whole time."   She does report that her cough did improve with much less sputum while on treatment. The sputum has since returned.    Review of Systems: Review of Systems  Constitutional: Positive for weight loss. Negative for chills and fever.  HENT: Negative for sore throat.   Respiratory: Positive for cough and sputum production. Negative for shortness of breath.   Cardiovascular: Negative for chest pain.  Gastrointestinal: Positive for nausea and vomiting. Negative for abdominal pain and diarrhea.  Genitourinary:  Negative for dysuria.  Skin: Negative for itching and rash.  Neurological: Positive for weakness (generalized).    Past Medical History:  Diagnosis Date  . Anemia   . BRCA negative 2011   BRCA I/ II negative  . Breast cancer ([place]) 08/2009   stage 2, rx with lumpectomy and xrt  . CHF (congestive heart failure) ([place])   . Chronic diastolic CHF (congestive heart failure) ([place]) 02/20/2020  . Constipation   . COPD (chronic obstructive pulmonary disease) (HCC)    pt.unsure of diagnosis status  .  Emphysema of lung ([place])    pt. questions diagnosis  . Essential hypertension 02/20/2020  . Family history of adverse reaction to anesthesia    My Sister has nausea  . Family history of breast cancer   . Family history of colon cancer   . Family history of lung cancer   . GERD (gastroesophageal reflux disease)    not presently having symptoms  . Hypertension   . Iatrogenic pneumothorax 02/06/2019  . Lung cancer ([place]) 06/06/05   stage 1 poorly differentiated adenocarcinoma, s/p right lower lobectomy.  . Osteopenia 09/13/2013  . Osteoporosis   . Personal history of lung cancer   . Pneumonia    2007ish, walking pnemonia - 2009 ish  . STD (sexually transmitted disease)    HSV  . Vitamin D deficiency 09/13/2013    Social History   Tobacco Use  . Smoking status: Former Smoker    Packs/day: 2.00    Years: 18.00    Pack years: 36.00    Quit date: 07/12/1987    Years since quitting: 32.6  . Smokeless tobacco: Never Used  Vaping Use  . Vaping Use: Never used  Substance Use Topics  . Alcohol use: Yes    Alcohol/week: 14.0 standard drinks    Types: 7 Glasses of wine, 7 Shots of liquor per week    Comment: social  . Drug use: No    Family History  Problem Relation Age of Onset  . Allergies Mother   . Asthma Mother   . Lung cancer Mother   . Breast cancer Mother 2       recurrence age 64  . Colon cancer Father 41  . Prostate cancer Brother 49  . Breast cancer Sister 60       Recurrence age 5 BRCA negative  . Colon polyps Sister   . Leukemia Sister   . Breast cancer Sister 32  . Prostate cancer Brother 64  . Breast cancer Maternal Grandmother 1  . Colon cancer Maternal Aunt   . Leukemia Maternal Grandfather   . Lung cancer Maternal Aunt   . Breast cancer Cousin   . Esophageal cancer Neg Hx   . Rectal cancer Neg Hx   . Stomach cancer Neg Hx    Allergies  Allergen Reactions  . Taxol [Paclitaxel] Anaphylaxis  . Tape Itching  . Abraxane [Paclitaxel Protein-Bound Part]  Rash  . Clarithromycin Rash    OBJECTIVE: Blood pressure 122/79, pulse 76, temperature 98.1 F (36.7 C), temperature source Oral, resp. rate 17, height '5\' 6"'  (1.676 m), weight 44.9 kg, SpO2 99 %.  Physical Exam Constitutional:      Appearance: She is ill-appearing.     Comments: Thin appearing sitting in recliner.  Eyes:     General: No scleral icterus.    Pupils: Pupils are equal, round, and reactive to light.  Cardiovascular:     Rate and Rhythm: Normal rate and regular rhythm.  Pulmonary:  Effort: Pulmonary effort is normal. No respiratory distress.     Breath sounds: Rhonchi present.  Abdominal:     General: There is no distension.     Palpations: Abdomen is soft.  Skin:    General: Skin is warm and dry.     Capillary Refill: Capillary refill takes less than 2 seconds.  Neurological:     Mental Status: She is alert and oriented to person, place, and time.     Lab Results Lab Results  Component Value Date   WBC 9.3 02/21/2020   HGB 8.3 (L) 02/21/2020   HCT 26.4 (L) 02/21/2020   MCV 80.7 02/21/2020   PLT 358 02/21/2020    Lab Results  Component Value Date   CREATININE 0.46 02/21/2020   BUN <5 (L) 02/21/2020   NA 122 (L) 02/21/2020   K 3.9 02/21/2020   CL 92 (L) 02/21/2020   CO2 22 02/21/2020    Lab Results  Component Value Date   ALT 43 02/20/2020   AST 29 02/20/2020   ALKPHOS 83 02/20/2020   BILITOT 0.6 02/20/2020     Microbiology: Recent Results (from the past 240 hour(s))  SARS Coronavirus 2 by RT PCR (hospital order, performed in [facility] hospital lab) Nasopharyngeal Nasopharyngeal Swab     Status: None   Collection Time: 02/20/20  8:23 PM   Specimen: Nasopharyngeal Swab  Result Value Ref Range Status   SARS Coronavirus 2 NEGATIVE NEGATIVE Final    Comment: (NOTE) SARS-CoV-2 target nucleic acids are NOT DETECTED.  The SARS-CoV-2 RNA is generally detectable in upper and lower respiratory specimens during the acute phase of infection. The  lowest concentration of SARS-CoV-2 viral copies this assay can detect is 250 copies / mL. A negative result does not preclude SARS-CoV-2 infection and should not be used as the sole basis for treatment or other patient management decisions.  A negative result may occur with improper specimen collection / handling, submission of specimen other than nasopharyngeal swab, presence of viral mutation(s) within the areas targeted by this assay, and inadequate number of viral copies (<250 copies / mL). A negative result must be combined with clinical observations, patient history, and epidemiological information.  Fact Sheet for Patients:   StrictlyIdeas.no  Fact Sheet for Healthcare Providers: BankingDealers.co.za  This test is not yet approved or  cleared by the [place] FDA and has been authorized for detection and/or diagnosis of SARS-CoV-2 by FDA under an Emergency Use Authorization (EUA).  This EUA will remain in effect (meaning this test can be used) for the duration of the COVID-19 declaration under Section 564(b)(1) of the Act, 21 U.S.C. section 360bbb-3(b)(1), unless the authorization is terminated or revoked sooner.  Performed at [facility], [place] [address] [place], [place] [address]      Janene Madeira, MSN, NP-C [place] [facility] [email or web address] Pager: [phone] Office: [phone] [place]: [phone]

## 2020-02-21 NOTE — Progress Notes (Signed)
Patient arrived to unit from ED. Report received from [place], Therapist, sports. Patient alert and oriented x4. Pt c/o 0/10 pain and no n/v. See assessment. Patient call bell within reach. Will continue to monitor.

## 2020-02-22 LAB — CBC WITH DIFFERENTIAL/PLATELET
Abs Immature Granulocytes: 0.07 10*3/uL (ref 0.00–0.07)
Basophils Absolute: 0 10*3/uL (ref 0.0–0.1)
Basophils Relative: 0 %
Eosinophils Absolute: 0.1 10*3/uL (ref 0.0–0.5)
Eosinophils Relative: 2 %
HCT: 26.3 % — ABNORMAL LOW (ref 36.0–46.0)
Hemoglobin: 8.7 g/dL — ABNORMAL LOW (ref 12.0–15.0)
Immature Granulocytes: 1 %
Lymphocytes Relative: 11 %
Lymphs Abs: 0.6 10*3/uL — ABNORMAL LOW (ref 0.7–4.0)
MCH: 27.1 pg (ref 26.0–34.0)
MCHC: 33.1 g/dL (ref 30.0–36.0)
MCV: 81.9 fL (ref 80.0–100.0)
Monocytes Absolute: 0.6 10*3/uL (ref 0.1–1.0)
Monocytes Relative: 11 %
Neutro Abs: 3.9 10*3/uL (ref 1.7–7.7)
Neutrophils Relative %: 75 %
Platelets: 430 10*3/uL — ABNORMAL HIGH (ref 150–400)
RBC: 3.21 MIL/uL — ABNORMAL LOW (ref 3.87–5.11)
RDW: 17.7 % — ABNORMAL HIGH (ref 11.5–15.5)
WBC: 5.2 10*3/uL (ref 4.0–10.5)
nRBC: 0 % (ref 0.0–0.2)

## 2020-02-22 LAB — MAGNESIUM: Magnesium: 1.7 mg/dL (ref 1.7–2.4)

## 2020-02-22 LAB — BASIC METABOLIC PANEL
Anion gap: 11 (ref 5–15)
BUN: <5 mg/dL — ABNORMAL LOW (ref 8–23)
CO2: 24 mmol/L (ref 22–32)
Calcium: 8.6 mg/dL — ABNORMAL LOW (ref 8.9–10.3)
Chloride: 92 mmol/L — ABNORMAL LOW (ref 98–111)
Creatinine, Ser: 0.53 mg/dL (ref 0.44–1.00)
GFR calc Af Amer: >60 mL/min (ref >60)
GFR calc non Af Amer: >60 mL/min (ref >60)
Glucose, Bld: 88 mg/dL (ref 70–99)
Potassium: 3.8 mmol/L (ref 3.5–5.1)
Sodium: 127 mmol/L — ABNORMAL LOW (ref 135–145)

## 2020-02-22 MED ORDER — SODIUM CHLORIDE 0.9 % IV SOLN: INTRAVENOUS | Status: AC

## 2020-02-22 NOTE — Progress Notes (Signed)
Patient ID: Anna Valentine, female   DOB: 12/15/1950, 69 y.o.   MRN: [id number]  PROGRESS NOTE    Anna Valentine  YHC:[id number] DOB: 12-Nov-1950 DOA: 02/20/2020 PCP: [name], MD   Brief Narrative:  69 year old female with past medical history of Mycobacterium avium complex (Dx 12/2018, follows with Dr. Lamonte Sakai) currently being treated with ethambutol, rifampin and azithromycin, history of breast cancer status post bilateral mastectomies, adenocarcinoma of the right lung status post right lower lobe lobectomy, bronchiectasis, hypertension, chronic diastolic congestive heart failure who presents to [facility] emergency department after being sent by her pulmonologist Dr. Lamonte Sakai.  Patient presented to Dr. Agustina Caroli office on 02/20/2020 for follow-up and was complaining of intermittent nausea with vomiting for several weeks with very poor oral intake and approximately 15 pound weight loss recently.  She was found to have sodium of 119 for which she was asked to go to the ED.  She was started on IV fluids.  Pulmonary and ID were consulted. Assessment & Plan:   Hyponatremia Dehydration -Presented with sodium of 119; probably from poor oral intake and dehydration -Treated with IV fluids.  Sodium level pending this morning.  Resume normal saline at 100 cc an hour.  Intermittent intractable nausea and vomiting -Patient is currently on treatment for MAC with ethambutol, Zithromax and rifampin.  Unclear if her symptoms are because of intolerance to these medications.  LFTs seem normal. - right upper quadrant ultrasound showed small amount of sludge in the gallbladder. -Continue antiemetics and Protonix. -MAC treatment has been discontinued for now as per ID recommendations  Acute metabolic encephalopathy -Probably from hyponatremia.  Monitor mental status.  Fall precautions -Mental status has improved.  MAC infection Bronchiectasis -Diagnosed in June 2020 via endoscopy, follows with Dr.  Lamonte Sakai with Pulmonology and Dr. Megan Salon with ID -Per pulmonology last note, if patient continues to not be able to tolerate medications due to severe nausea and surgical options may need to be considered -CT of the chest with contrast shows chronic changes with multiple cystic cavitary lesions, bronchiectasis.  -Pulmonary and ID consultation appreciated: They have signed off.  ID recommends to hold off on MAC treatment for at least 10 to 14 days till reevaluation by ID as an outpatient. -Patient has been started on hypertonic saline nebulizers which will be continued on discharge as well.  Chronic diastolic CHF -Currently compensated.  No evidence of volume overload.  Strict input and output.  Daily weights.   Anemia of chronic disease -Probably from her chronic illnesses.  Hemoglobin stable.  No signs of bleeding.  Monitor  Essential hypertension -Blood pressure currently stable.  Continue monitoring.  History of breast cancer status post bilateral mastectomies -Continue tamoxifen.  Outpatient follow-up with oncology  History of lung cancer status post right lower lobe lobectomy -Follow pulmonary recommendations.  Outpatient follow-up with oncology  Generalized deconditioning -PT recommends home health PT.   DVT prophylaxis: Lovenox Code Status: Full Family Communication: Patient at bedside Disposition Plan: Status is: Inpatient  Remains inpatient appropriate because:Inpatient level of care appropriate due to severity of illness   Dispo: The patient is from: Home              Anticipated d/c is to: Home              Anticipated d/c date is: 1 day              Patient currently is not medically stable to d/c.  Consultants: Pulmonary  Procedures:  None  Antimicrobials:  Anti-infectives (From admission, onward)   Start     Dose/Rate Route Frequency Ordered Stop   02/21/20 1000  azithromycin (ZITHROMAX) tablet 500 mg  Status:  Discontinued        500 mg Oral Daily 02/20/20  2135 02/21/20 0932   02/21/20 1000  ethambutol (MYAMBUTOL) tablet 600 mg  Status:  Discontinued        600 mg Oral Daily 02/20/20 2135 02/21/20 0932   02/21/20 1000  ethambutol (MYAMBUTOL) tablet 400 mg  Status:  Discontinued       Note to Pharmacy: Take with two 100 mg tablets to equal 600 mg total.     400 mg Oral Daily 02/20/20 2135 02/20/20 2333   02/21/20 1000  rifampin (RIFADIN) capsule 600 mg  Status:  Discontinued        600 mg Oral Daily 02/20/20 2135 02/21/20 0932   02/20/20 2330  acyclovir (ZOVIRAX) tablet 400 mg     Discontinue     400 mg Oral 2 times daily 02/20/20 2135        Subjective: Patient seen and examined at bedside.  Feels slightly better.  Nausea is improving.  No overnight fever or vomiting reported.  She feels very weak.  Appetite is poor. Objective: Vitals:   02/21/20 1422 02/21/20 1932 02/21/20 1949 02/22/20 0426  BP: 122/79 137/80  134/78  Pulse: 76 76  77  Resp: _0 Temp: 98.1 F (36.7 C) 98.2 F (36.8 C)  98.1 F (36.7 C)  TempSrc: Oral Oral  Oral  SpO2: 99% 100% 100% 100%  Weight:      Height:        Intake/Output Summary (Last 24 hours) at 02/22/2020 0813 Last data filed at 02/21/2020 1628 Gross per 24 hour  Intake 1207.04 ml  Output --  Net 1207.04 ml   Filed Weights   02/20/20 1600  Weight: 44.9 kg    Examination:  General exam: No acute distress.  Looks chronically ill and very thinly built Respiratory system: Bilateral decreased breath sounds at bases with with some crackles, no wheezing  cardiovascular system: Rate controlled, S1-S2 heard Gastrointestinal system: Abdomen is nondistended, soft and nontender.  Bowel sounds are heard  extremities: No clubbing, edema Central nervous system: Awake and alert.  No focal neurological deficits.  Moves extremities  skin: No obvious ecchymosis/lesions Psychiatry: Flat affect   Data Reviewed: I have personally reviewed following labs and imaging studies  CBC: Recent Labs  Lab  02/17/20 1638 02/20/20 1612 02/21/20 0540 02/22/20 0412  WBC 10.7* 9.1 9.3 5.2  NEUTROABS 9.3*  --  7.9* 3.9  HGB 10.4* 9.7* 8.3* 8.7*  HCT 33.3* 31.1* 26.4* 26.3*  MCV 81.6 81.6 80.7 81.9  PLT 370 431* 358 650*   Basic Metabolic Panel: Recent Labs  Lab 02/20/20 2130 02/21/20 0130 02/21/20 0540 02/21/20 0838 02/21/20 1344 02/22/20 0412  NA 119* 121* 123* 123* 122*  --   K 3.7 3.9 4.5 4.4 3.9  --   CL 89* 89* 92* 91* 92*  --   CO2 20* 22 21* 23 22  --   GLUCOSE 101* 111* 93 91 128*  --   BUN 7* 7* 5* 5* <5*  --   CREATININE 0.47 0.52 0.50 0.51 0.46  --   CALCIUM 8.2* 8.4* 7.9* 8.3* 8.0*  --   MG 1.6*  --  1.7  --   --  1.7   GFR: Estimated Creatinine Clearance: 47.7 mL/min (  by C-G formula based on SCr of 0.46 mg/dL). Liver Function Tests: Recent Labs  Lab 02/17/20 1638 02/20/20 1921  AST 55* 29  ALT 57* 43  ALKPHOS 113 83  BILITOT 1.0 0.6  PROT 7.7 6.3*  ALBUMIN 2.9* 2.6*   Recent Labs  Lab 02/20/20 2130  LIPASE 32   No results for input(s): AMMONIA in the last 168 hours. Coagulation Profile: No results for input(s): INR, PROTIME in the last 168 hours. Cardiac Enzymes: No results for input(s): CKTOTAL, CKMB, CKMBINDEX, TROPONINI in the last 168 hours. BNP (last 3 results) No results for input(s): PROBNP in the last 8760 hours. HbA1C: No results for input(s): HGBA1C in the last 72 hours. CBG: No results for input(s): GLUCAP in the last 168 hours. Lipid Profile: No results for input(s): CHOL, HDL, LDLCALC, TRIG, CHOLHDL, LDLDIRECT in the last 72 hours. Thyroid Function Tests: Recent Labs    02/20/20 2230  TSH 1.580   Anemia Panel: Recent Labs    02/20/20 2119 02/20/20 2130  VITAMINB12  --  272  FOLATE 14.6  --   TIBC  --  176*  IRON  --  34   Sepsis Labs: No results for input(s): PROCALCITON, LATICACIDVEN in the last 168 hours.  Recent Results (from the past 240 hour(s))  SARS Coronavirus 2 by RT PCR (hospital order, performed in [facility] hospital lab) Nasopharyngeal Nasopharyngeal Swab     Status: None   Collection Time: 02/20/20  8:23 PM   Specimen: Nasopharyngeal Swab  Result Value Ref Range Status   SARS Coronavirus 2 NEGATIVE NEGATIVE Final    Comment: (NOTE) SARS-CoV-2 target nucleic acids are NOT DETECTED.  The SARS-CoV-2 RNA is generally detectable in upper and lower respiratory specimens during the acute phase of infection. The lowest concentration of SARS-CoV-2 viral copies this assay can detect is 250 copies / mL. A negative result does not preclude SARS-CoV-2 infection and should not be used as the sole basis for treatment or other patient management decisions.  A negative result may occur with improper specimen collection / handling, submission of specimen other than nasopharyngeal swab, presence of viral mutation(s) within the areas targeted by this assay, and inadequate number of viral copies (<250 copies / mL). A negative result must be combined with clinical observations, patient history, and epidemiological information.  Fact Sheet for Patients:   StrictlyIdeas.no  Fact Sheet for Healthcare Providers: BankingDealers.co.za  This test is not yet approved or  cleared by the [place] FDA and has been authorized for detection and/or diagnosis of SARS-CoV-2 by FDA under an Emergency Use Authorization (EUA).  This EUA will remain in effect (meaning this test can be used) for the duration of the COVID-19 declaration under Section 564(b)(1) of the Act, 21 U.S.C. section 360bbb-3(b)(1), unless the authorization is terminated or revoked sooner.  Performed at [facility], [place] [address] [place], [place] [address]          Radiology Studies: DG Chest 2 View  Result Date: 02/20/2020 CLINICAL DATA:  Shortness of breath EXAM: CHEST - 2 VIEW COMPARISON:  01/08/2020.  12/02/2019. FINDINGS: Improvement compared to the study 01/08/2020.  Background pattern of emphysema of with bolus disease more extensive on the right than the left. Widespread pulmonary infiltrate previously seen on the right has improved, but there are some persistent areas of density that could be some ongoing pneumonia. Small amount of pleural fluid present on the right. No acute bone finding. IMPRESSION: Improvement of widespread pulmonary infiltrate  on the right. Some persistent areas of density could be ongoing pneumonia. Small right effusion. Background pattern emphysema with severe bullous disease on the right Electronically Signed   By: Nelson Chimes M.D.   On: 02/20/2020 16:04   DG Lumbar Spine 2-3 Views  Result Date: 02/20/2020 CLINICAL DATA:  Low back pain EXAM: LUMBAR SPINE - 2-3 VIEW COMPARISON:  None. FINDINGS: Degenerative facet disease in the lower lumbar spine. Disc space narrowing at L5-S1. No fracture or subluxation. SI joints symmetric and unremarkable. IMPRESSION: Degenerative changes.  No acute bony abnormality. Electronically Signed   By: Rolm Baptise M.D.   On: 02/20/2020 22:14   CT CHEST W CONTRAST  Result Date: 02/20/2020 CLINICAL DATA:  69 year old female with non-small cell lung cancer presenting with weight loss and shortness of breath. EXAM: CT CHEST WITH CONTRAST TECHNIQUE: Multidetector CT imaging of the chest was performed during intravenous contrast administration. CONTRAST:  44m OMNIPAQUE IOHEXOL 300 MG/ML  SOLN COMPARISON:  Chest CT dated 12/18/2019. FINDINGS: Cardiovascular: There is no cardiomegaly or pericardial effusion. There is coronary vascular calcification. Mild atherosclerotic calcification of the thoracic aorta. No aneurysmal dilatation or dissection. The origins of the great vessels of the aortic arch appear patent. No pulmonary artery embolus identified. Mediastinum/Nodes: Mildly enlarged mediastinal lymph node measures 13 mm in short axis anterior to the carina. The esophagus is grossly unremarkable. No mediastinal fluid  collection. Lungs/Pleura: There is postoperative changes of right lower lobectomy with associated volume loss and shift of the mediastinum into the right hemithorax. The previously described masslike consolidation in the right middle lobe appears less solid on today's exam and has been replaced by areas of cavitation and cystic changes. This likely represented an infected cavitary lesion. There is diffuse tubular bronchiectasis of the right lung. There is a mildly lobulated cavitary lesion extending from the posterior aspect of the right upper lobe inferiorly along the fissure. Several additional cavitary lesions noted throughout the right lung which appear progressed since the prior CT. An area of subpleural consolidation at the right lung base again noted similar to prior CT which may represent atelectasis or postoperative scarring versus pneumonia. There is diffuse scattered pulmonary nodules in the right lung which may be inflammatory or infectious in etiology. Metastatic disease is favored less likely but not excluded clinical correlation is recommended. Several small scattered nodules measuring 3-4 mm noted in the left lower lobe, likely post inflammatory. A cluster of small nodular densities in the lingula with a small cavitary focus noted which appears similar to prior CT. There is no pneumothorax. Secretin is noted in the trachea and right mainstem bronchus. Upper Abdomen: There is a 2 cm right renal cyst. Musculoskeletal: There is loss of subcutaneous fat and cachexia. No acute osseous pathology. IMPRESSION: 1. Postsurgical changes of right lower lobectomy with associated volume loss and shift of the mediastinum into the right hemithorax. 2. The previously described masslike consolidation in the right middle lobe appears less solid on today's exam and has been replaced by areas of cavitation and cystic changes. This likely represented an infected an impacted cavitary lesion. Continued follow-up  recommended. 3. Multiple cystic and cavitary lesions throughout the right lung as well as diffuse right lung bronchiectasis. 4. Diffuse scattered pulmonary nodules in the right lung may be inflammatory or infectious in etiology. Metastatic disease is favored less likely but not excluded. Clinical correlation is recommended. 5. An area of subpleural consolidation at the right lung base again noted similar to prior CT which may represent  atelectasis or postoperative scarring versus pneumonia. 6. Aortic Atherosclerosis (ICD10-I70.0). Electronically Signed   By: Anner Crete M.D.   On: 02/20/2020 22:39   DG Abd 2 Views  Result Date: 02/20/2020 CLINICAL DATA:  69 year old female with low back pain.  Lung cancer. EXAM: ABDOMEN - 2 VIEW COMPARISON:  PET-CT 12/31/2019. FINDINGS: Upright and supine views of the abdomen and pelvis. No pneumoperitoneum. Partially visible abnormal right lung, not significantly changed from the scout view of the PET-CT. Non obstructed bowel gas pattern. Chronic probable fibroid related dystrophic calcification in the right hemipelvis. Superimposed vascular calcifications in the abdomen and pelvis. No acute osseous abnormality identified. IMPRESSION: 1.  Normal bowel gas pattern, no free air. 2. Partially visible abnormal right lung appears grossly stable since June. Electronically Signed   By: Genevie Ann M.D.   On: 02/20/2020 22:15   US Abdomen Limited RUQ  Result Date: 02/21/2020 CLINICAL DATA:  Nausea and vomiting for 2 weeks. EXAM: ULTRASOUND ABDOMEN LIMITED RIGHT UPPER QUADRANT COMPARISON:  None. FINDINGS: Gallbladder: No gallstones or wall thickening visualized. Small amount of sludge in the gallbladder. No sonographic Murphy sign noted by sonographer. Common bile duct: Diameter: 0.4 cm, within normal limits. Liver: No focal lesion identified. Within normal limits in parenchymal echogenicity. Portal vein is patent on color Doppler imaging with normal direction of blood flow towards  the liver. Other: 2.1 cm cyst in the right kidney. IMPRESSION: No sonographic finding to explain the patient's symptoms. Small amount of sludge in the gallbladder. Electronically Signed   By: Audie Pinto M.D.   On: 02/21/2020 10:10        Scheduled Meds: . acyclovir  400 mg Oral BID  . enoxaparin (LOVENOX) injection  40 mg Subcutaneous Q24H  . gabapentin  300 mg Oral QHS  . mirtazapine  7.5 mg Oral QHS  . pantoprazole (PROTONIX) IV  40 mg Intravenous Q24H  . sodium chloride HYPERTONIC  4 mL Nebulization BID  . tamoxifen  20 mg Oral Daily   Continuous Infusions: . sodium chloride            Aline August, MD [facility] 02/22/2020, 8:13 AM

## 2020-02-22 NOTE — Plan of Care (Signed)

## 2020-02-23 LAB — BASIC METABOLIC PANEL
Anion gap: 7 (ref 5–15)
BUN: <5 mg/dL — ABNORMAL LOW (ref 8–23)
CO2: 24 mmol/L (ref 22–32)
Calcium: 8 mg/dL — ABNORMAL LOW (ref 8.9–10.3)
Chloride: 98 mmol/L (ref 98–111)
Creatinine, Ser: 0.52 mg/dL (ref 0.44–1.00)
GFR calc Af Amer: >60 mL/min (ref >60)
GFR calc non Af Amer: >60 mL/min (ref >60)
Glucose, Bld: 95 mg/dL (ref 70–99)
Potassium: 3.7 mmol/L (ref 3.5–5.1)
Sodium: 129 mmol/L — ABNORMAL LOW (ref 135–145)

## 2020-02-23 LAB — MAGNESIUM: Magnesium: 1.8 mg/dL (ref 1.7–2.4)

## 2020-02-23 MED ORDER — SODIUM CHLORIDE 0.9 % IV SOLN: INTRAVENOUS | Status: AC

## 2020-02-23 MED ORDER — ADULT MULTIVITAMIN W/MINERALS CH
1.0000 | ORAL_TABLET | Freq: Every day | ORAL | Status: DC
  Administered 2020-02-23 – 2020-02-24 (×2): 1 via ORAL
  Filled 2020-02-23 (×2): qty 1

## 2020-02-23 MED ORDER — WHITE PETROLATUM EX OINT
TOPICAL_OINTMENT | CUTANEOUS | Status: AC
  Filled 2020-02-23: qty 28.35

## 2020-02-23 MED ORDER — PANTOPRAZOLE SODIUM 40 MG PO TBEC
40.0000 mg | DELAYED_RELEASE_TABLET | Freq: Every day | ORAL | Status: DC
  Administered 2020-02-24: 40 mg via ORAL
  Filled 2020-02-23: qty 1

## 2020-02-23 MED ORDER — ENSURE ENLIVE PO LIQD
237.0000 mL | Freq: Two times a day (BID) | ORAL | Status: DC
  Administered 2020-02-23 – 2020-02-24 (×2): 237 mL via ORAL

## 2020-02-23 NOTE — Plan of Care (Signed)
  Problem: Education: Goal: Knowledge of General Education information will improve Description Including pain rating scale, medication(s)/side effects and non-pharmacologic comfort measures Outcome: Progressing   

## 2020-02-23 NOTE — Progress Notes (Signed)
Initial Nutrition Assessment  RD working remotely.  DOCUMENTATION CODES:   Underweight  INTERVENTION:   -Ensure Enlive po BID, each supplement provides 350 kcal and 20 grams of protein -Magic cup TID with meals, each supplement provides 290 kcal and 9 grams of protein -MVI with minerals daily  NUTRITION DIAGNOSIS:   Inadequate oral intake related to poor appetite as evidenced by meal completion < 25%.  GOAL:   Patient will meet greater than or equal to 90% of their needs  MONITOR:   PO intake, Supplement acceptance, Labs, Weight trends, Skin, I & O's  REASON FOR ASSESSMENT:   Consult Assessment of nutrition requirement/status, Diet education, Poor PO  ASSESSMENT:   69 year old female with past medical history of Mycobacterium avium complex (Dx 12/2018, follows with Dr. Lamonte Sakai), history of breast cancer status post bilateral mastectomies, adenocarcinoma of the right lung status post right lower lobe lobectomy, bronchiectasis, hypertension, diastolic congestive heart failure who presents to [facility] emergency department after being sent by her pulmonologist Dr. Lamonte Sakai.  Pt admitted with dehydration and hyponatremia.   Reviewed I/O's: +2.7 L x 24 hours and +4.6 L since admission  Attempted to speak with pt via phone call to hospital room, however, no answer.   Per H&P, pt with intractable nausea and vomiting for several weeks PTA in addition to poor oral intake and weight loss. Pt has been grieving the death of her husband, which is also contributing to weight loss.   Pt with poor oral intake; noted meal completion 20%.   Reviewed wt hx; pt has experienced a 14.6% wt loss over the past year. While this is not significant for time frame, it is concerning given pt's underweight status, poor oral intake, and grief related to her husband's passing. Highly suspect pt with malnutrition, however, unable to identify at this time.   Medications reviewed and include remeron  and 0.9% sodium chloride infusion @ 100 ml/hr.   Labs reviewed: Na: 129.  Diet Order:   Diet Order            Diet regular Room service appropriate? Yes; Fluid consistency: Thin  Diet effective now                 EDUCATION NEEDS:   No education needs have been identified at this time  Skin:  Skin Assessment: Reviewed RN Assessment  Last BM:  02/22/20  Height:   Ht Readings from Last 1 Encounters:  02/20/20 _0  (1.676 m)    Weight:   Wt Readings from Last 1 Encounters:  02/20/20 44.9 kg    Ideal Body Weight:  59.1 kg  BMI:  Body mass index is 15.98 kg/m.  Estimated Nutritional Needs:   Kcal:  1600-1800  Protein:  75-90 grams  Fluid:  > 1.6 L    Loistine Chance, RD, LDN, [place] Registered Dietitian II Certified Diabetes Care and Education Specialist Please refer to [facility] for RD and/or RD on-call/weekend/after hours pager

## 2020-02-23 NOTE — Progress Notes (Signed)
Patient ID: Anna Valentine, female   DOB: 12-29-1950, 69 y.o.   MRN: [id number]  PROGRESS NOTE    CRUCITA LACORTE  KAJ:[id number] DOB: 04-06-1951 DOA: 02/20/2020 PCP: [name], MD   Brief Narrative:  69 year old female with past medical history of Mycobacterium avium complex (Dx 12/2018, follows with Dr. Lamonte Sakai) currently being treated with ethambutol, rifampin and azithromycin, history of breast cancer status post bilateral mastectomies, adenocarcinoma of the right lung status post right lower lobe lobectomy, bronchiectasis, hypertension, chronic diastolic congestive heart failure who presents to [facility] emergency department after being sent by her pulmonologist Dr. Lamonte Sakai.  Patient presented to Dr. Agustina Caroli office on 02/20/2020 for follow-up and was complaining of intermittent nausea with vomiting for several weeks with very poor oral intake and approximately 15 pound weight loss recently.  She was found to have sodium of 119 for which she was asked to go to the ED.  She was started on IV fluids.  Pulmonary and ID were consulted. Assessment & Plan:   Hyponatremia Dehydration -Presented with sodium of 119; probably from poor oral intake and dehydration -Treated with IV fluids.  Sodium level 139.  Continue normal saline at 100 cc an hour for 1 more day.  Oral intake still poor.  Intermittent intractable nausea and vomiting -Patient is currently on treatment for MAC with ethambutol, Zithromax and rifampin.  Unclear if her symptoms are because of intolerance to these medications.  LFTs seem normal. - right upper quadrant ultrasound showed small amount of sludge in the gallbladder. -Continue antiemetics and Protonix. -MAC treatment has been discontinued for now as per ID recommendations -Nausea and vomiting improving.  Oral intake still poor.  Continue IV fluids as above.  Acute metabolic encephalopathy -Probably from hyponatremia.  Monitor mental status.  Fall precautions -Mental  status has improved.  MAC infection Bronchiectasis -Diagnosed in June 2020 via endoscopy, follows with Dr. Lamonte Sakai with Pulmonology and Dr. Megan Salon with ID -Per pulmonology last note, if patient continues to not be able to tolerate medications due to severe nausea and surgical options may need to be considered -CT of the chest with contrast shows chronic changes with multiple cystic cavitary lesions, bronchiectasis.  -Pulmonary and ID consultation appreciated: They have signed off.  ID recommends to hold off on MAC treatment for at least 10 to 14 days till reevaluation by ID as an outpatient. -Patient has been started on hypertonic saline nebulizers which will be continued on discharge as well.  Chronic diastolic CHF -Currently compensated.  No evidence of volume overload.  Strict input and output.  Daily weights.   Anemia of chronic disease -Probably from her chronic illnesses.  Hemoglobin stable.  No signs of bleeding.  Monitor  Essential hypertension -Blood pressure currently stable.  Continue monitoring.  History of breast cancer status post bilateral mastectomies -Continue tamoxifen.  Outpatient follow-up with oncology  History of lung cancer status post right lower lobe lobectomy -Follow pulmonary recommendations.  Outpatient follow-up with oncology  Generalized deconditioning -PT recommends home health PT.   DVT prophylaxis: Lovenox Code Status: Full Family Communication: Patient at bedside Disposition Plan: Status is: Inpatient  Remains inpatient appropriate because:Inpatient level of care appropriate due to severity of illness   Dispo: The patient is from: Home              Anticipated d/c is to: Home              Anticipated d/c date is: 1 day  Patient currently is not medically stable to d/c.  Consultants: Pulmonary  Procedures: None  Antimicrobials:  Anti-infectives (From admission, onward)   Start     Dose/Rate Route Frequency Ordered Stop    02/21/20 1000  azithromycin (ZITHROMAX) tablet 500 mg  Status:  Discontinued        500 mg Oral Daily 02/20/20 2135 02/21/20 0932   02/21/20 1000  ethambutol (MYAMBUTOL) tablet 600 mg  Status:  Discontinued        600 mg Oral Daily 02/20/20 2135 02/21/20 0932   02/21/20 1000  ethambutol (MYAMBUTOL) tablet 400 mg  Status:  Discontinued       Note to Pharmacy: Take with two 100 mg tablets to equal 600 mg total.     400 mg Oral Daily 02/20/20 2135 02/20/20 2333   02/21/20 1000  rifampin (RIFADIN) capsule 600 mg  Status:  Discontinued        600 mg Oral Daily 02/20/20 2135 02/21/20 0932   02/20/20 2330  acyclovir (ZOVIRAX) tablet 400 mg     Discontinue     400 mg Oral 2 times daily 02/20/20 2135        Subjective: Patient seen and examined at bedside.  Feels slightly better.  Nausea is improving.  No overnight fever or vomiting reported.  Complains of intermittent cough with night sweats.  Oral intake is still poor.  Does not feel ready to go home today.  Still feels weak. Objective: Vitals:   02/22/20 2012 02/22/20 2109 02/23/20 0510 02/23/20 0859  BP: 119/72  132/81   Pulse: 84  75   Resp: 17  16   Temp: 98 F (36.7 C)  98 F (36.7 C)   TempSrc: Oral  Oral   SpO2: 100% 99% 98% 96%  Weight:      Height:        Intake/Output Summary (Last 24 hours) at 02/23/2020 1103 Last data filed at 02/23/2020 0544 Gross per 24 hour  Intake 2066.9 ml  Output --  Net 2066.9 ml   Filed Weights   02/20/20 1600  Weight: 44.9 kg    Examination:  General exam: No distress.  Looks chronically ill and very thinly built Respiratory system: Bilateral decreased breath sounds at bases with with scattered crackles.   Cardiovascular system: S1-S2 heard, rate controlled Gastrointestinal system: Abdomen is nondistended, soft and nontender.  Normal bowel sounds heard  extremities: No cyanosis or lower extremity edema  Data Reviewed: I have personally reviewed following labs and imaging  studies  CBC: Recent Labs  Lab 02/17/20 1638 02/20/20 1612 02/21/20 0540 02/22/20 0412  WBC 10.7* 9.1 9.3 5.2  NEUTROABS 9.3*  --  7.9* 3.9  HGB 10.4* 9.7* 8.3* 8.7*  HCT 33.3* 31.1* 26.4* 26.3*  MCV 81.6 81.6 80.7 81.9  PLT 370 431* 358 637*   Basic Metabolic Panel: Recent Labs  Lab 02/20/20 2130 02/21/20 0130 02/21/20 0540 02/21/20 0838 02/21/20 1344 02/22/20 0412 02/22/20 0844 02/23/20 0249  NA 119*   < > 123* 123* 122*  --  127* 129*  K 3.7   < > 4.5 4.4 3.9  --  3.8 3.7  CL 89*   < > 92* 91* 92*  --  92* 98  CO2 20*   < > 21* 23 22  --  24 24  GLUCOSE 101*   < > 93 91 128*  --  88 95  BUN 7*   < > 5* 5* <5*  --  <5* <5*  CREATININE  0.47   < > 0.50 0.51 0.46  --  0.53 0.52  CALCIUM 8.2*   < > 7.9* 8.3* 8.0*  --  8.6* 8.0*  MG 1.6*  --  1.7  --   --  1.7  --  1.8   < > = values in this interval not displayed.   GFR: Estimated Creatinine Clearance: 47.7 mL/min (by C-G formula based on SCr of 0.52 mg/dL). Liver Function Tests: Recent Labs  Lab 02/17/20 1638 02/20/20 1921  AST 55* 29  ALT 57* 43  ALKPHOS 113 83  BILITOT 1.0 0.6  PROT 7.7 6.3*  ALBUMIN 2.9* 2.6*   Recent Labs  Lab 02/20/20 2130  LIPASE 32   No results for input(s): AMMONIA in the last 168 hours. Coagulation Profile: No results for input(s): INR, PROTIME in the last 168 hours. Cardiac Enzymes: No results for input(s): CKTOTAL, CKMB, CKMBINDEX, TROPONINI in the last 168 hours. BNP (last 3 results) No results for input(s): PROBNP in the last 8760 hours. HbA1C: No results for input(s): HGBA1C in the last 72 hours. CBG: No results for input(s): GLUCAP in the last 168 hours. Lipid Profile: No results for input(s): CHOL, HDL, LDLCALC, TRIG, CHOLHDL, LDLDIRECT in the last 72 hours. Thyroid Function Tests: Recent Labs    02/20/20 2230  TSH 1.580   Anemia Panel: Recent Labs    02/20/20 2119 02/20/20 2130  VITAMINB12  --  272  FOLATE 14.6  --   TIBC  --  176*  IRON  --  34    Sepsis Labs: No results for input(s): PROCALCITON, LATICACIDVEN in the last 168 hours.  Recent Results (from the past 240 hour(s))  SARS Coronavirus 2 by RT PCR (hospital order, performed in [facility] hospital lab) Nasopharyngeal Nasopharyngeal Swab     Status: None   Collection Time: 02/20/20  8:23 PM   Specimen: Nasopharyngeal Swab  Result Value Ref Range Status   SARS Coronavirus 2 NEGATIVE NEGATIVE Final    Comment: (NOTE) SARS-CoV-2 target nucleic acids are NOT DETECTED.  The SARS-CoV-2 RNA is generally detectable in upper and lower respiratory specimens during the acute phase of infection. The lowest concentration of SARS-CoV-2 viral copies this assay can detect is 250 copies / mL. A negative result does not preclude SARS-CoV-2 infection and should not be used as the sole basis for treatment or other patient management decisions.  A negative result may occur with improper specimen collection / handling, submission of specimen other than nasopharyngeal swab, presence of viral mutation(s) within the areas targeted by this assay, and inadequate number of viral copies (<250 copies / mL). A negative result must be combined with clinical observations, patient history, and epidemiological information.  Fact Sheet for Patients:   StrictlyIdeas.no  Fact Sheet for Healthcare Providers: BankingDealers.co.za  This test is not yet approved or  cleared by the [place] FDA and has been authorized for detection and/or diagnosis of SARS-CoV-2 by FDA under an Emergency Use Authorization (EUA).  This EUA will remain in effect (meaning this test can be used) for the duration of the COVID-19 declaration under Section 564(b)(1) of the Act, 21 U.S.C. section 360bbb-3(b)(1), unless the authorization is terminated or revoked sooner.  Performed at [facility], [place] [address], [place], [place] [address]          Radiology  Studies: No results found.      Scheduled Meds: . acyclovir  400 mg Oral BID  . enoxaparin (LOVENOX) injection  40 mg  Subcutaneous Q24H  . gabapentin  300 mg Oral QHS  . mirtazapine  7.5 mg Oral QHS  . pantoprazole (PROTONIX) IV  40 mg Intravenous Q24H  . sodium chloride HYPERTONIC  4 mL Nebulization BID  . tamoxifen  20 mg Oral Daily   Continuous Infusions: . sodium chloride 100 mL/hr at 02/23/20 0957          Aline August, MD [facility] 02/23/2020, 11:03 AM

## 2020-02-24 LAB — BASIC METABOLIC PANEL
Anion gap: 8 (ref 5–15)
BUN: <5 mg/dL — ABNORMAL LOW (ref 8–23)
CO2: 26 mmol/L (ref 22–32)
Calcium: 8.2 mg/dL — ABNORMAL LOW (ref 8.9–10.3)
Chloride: 100 mmol/L (ref 98–111)
Creatinine, Ser: 0.46 mg/dL (ref 0.44–1.00)
GFR calc Af Amer: >60 mL/min (ref >60)
GFR calc non Af Amer: >60 mL/min (ref >60)
Glucose, Bld: 100 mg/dL — ABNORMAL HIGH (ref 70–99)
Potassium: 3.6 mmol/L (ref 3.5–5.1)
Sodium: 134 mmol/L — ABNORMAL LOW (ref 135–145)

## 2020-02-24 LAB — MAGNESIUM: Magnesium: 1.7 mg/dL (ref 1.7–2.4)

## 2020-02-24 MED ORDER — SODIUM CHLORIDE 3 % IN NEBU
INHALATION_SOLUTION | Freq: Two times a day (BID) | RESPIRATORY_TRACT | 0 refills | Status: DC
Start: 2020-02-24 — End: 2020-02-25

## 2020-02-24 MED ORDER — PANTOPRAZOLE SODIUM 40 MG PO TBEC: 40.0000 mg | DELAYED_RELEASE_TABLET | Freq: Every day | ORAL | 0 refills | Status: DC

## 2020-02-24 NOTE — TOC Initial Note (Signed)
Transition of Care [facility]) - Initial/Assessment Note    Patient Details  Name: Anna Valentine MRN: [id number] Date of Birth: July 19, 1950  Transition of [facility]) CM/SW Contact:    Marilu Favre, RN Phone Number: 02/24/2020, 11:11 AM  Clinical Narrative:                 Spoke to patient via phone. Patient requesting home health services at discharge. Patient has no preference on agency. Cory with [facility] accepted.  Lane Hacker with [facility] for NEB machine   Expected Discharge Plan: [place] Barriers to Discharge: No Barriers Identified   Patient Goals and CMS Choice Patient states their goals for this hospitalization and ongoing recovery are:: to go home CMS Medicare.gov Compare Post Acute Care list provided to:: Patient Choice offered to / list presented to : Patient  Expected Discharge Plan and Services Expected Discharge Plan: [place]   Discharge Planning Services: CM Consult Post Acute Care Choice: Home Health, Durable Medical Equipment Living arrangements for the past 2 months: Single Family Home Expected Discharge Date: 02/24/20               DME Arranged: Nebulizer machine DME Agency: [employer] Date DME Agency Contacted: 02/24/20 Time DME Agency Contacted: 1110 Representative spoke with at DME Agency: left message for [facility] HH Arranged: RN, PT, Nurse's Aide [place] Agency: [place] Date [facility] Agency Contacted: 02/24/20 Time HH Agency Contacted: 1110 Representative spoke with at [place]  Prior Living Arrangements/Services Living arrangements for the past 2 months: [place] with:: Self Patient language and need for interpreter reviewed:: Yes Do you feel safe going back to the place where you live?: Yes      Need for Family Participation in Patient Care: Yes (Comment) Care giver support system in place?: Yes (comment) Current home services: DME Criminal Activity/Legal Involvement  Pertinent to Current Situation/Hospitalization: No - Comment as needed  Activities of Daily Living      Permission Sought/Granted   Permission granted to share information with : No              Emotional Assessment   Attitude/Demeanor/Rapport: Engaged Affect (typically observed): Accepting Orientation: : Oriented to Self, Oriented to Place, Oriented to  Time, Oriented to Situation Alcohol / Substance Use: Not Applicable Psych Involvement: No (comment)  Admission diagnosis:  Low back pain [M54.5] Hyponatremia [E87.1] Patient Active Problem List   Diagnosis Date Noted  . Hyponatremia   . Dehydration with hyponatremia 02/20/2020  . Intractable nausea and vomiting 02/20/2020  . Low back pain 02/20/2020  . Chronic diastolic CHF (congestive heart failure) ([place]) 02/20/2020  . Essential hypertension 02/20/2020  . Acute metabolic encephalopathy 65/78/4696  . Mycobacterium avium infection ([place]) 02/11/2020  . Right epiretinal membrane 01/30/2020  . 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 Nuclear sclerotic cataract of right eye 01/30/2020  . Nuclear sclerotic cataract of left eye 01/30/2020  . Iron deficiency anemia 01/20/2020  . Malignant neoplasm of overlapping sites of right breast in female, estrogen receptor positive ([place]) 09/20/2019  . Weight loss, non-intentional 09/20/2019  . Postprocedural pneumothorax   . 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 Port-A-Cath in place 04/26/2018  . Genetic testing 02/20/2018  . Family history of breast cancer   . Family history of lung cancer   . Personal history of lung cancer   . Chest pain 11/28/2017  . Abnormal CT of the chest 11/30/2016  . Malignant neoplasm of  upper-inner quadrant of left breast in female, estrogen receptor positive ([place]) 06/16/2014  . Osteopenia 09/13/2013  . Postmenopausal atrophic vaginitis 09/13/2013  . Vitamin D deficiency 09/13/2013  . Malignant neoplasm of lower lobe of right lung ([place]) 07/24/2012  . History of  lung cancer 03/11/2011  . Anxiety 03/11/2011  . History of neutropenia 03/11/2011  . Family history of colon cancer 03/11/2011  . Cough 11/15/2010  . Bronchiectasis without complication ([place]) 65/78/4696   PCP:  [name], MD Pharmacy:   [place], [place] [address] Phone: [phone] Fax: [phone]     Social Determinants of Health (SDOH) Interventions    Readmission Risk Interventions No flowsheet data found.

## 2020-02-24 NOTE — Discharge Summary (Signed)
Physician Discharge Summary  Anna Valentine YYQ:[id number] DOB: 01-31-1951 DOA: 02/20/2020  PCP: [name], MD  Admit date: 02/20/2020 Discharge date: 02/24/2020  Admitted From: Home Disposition: Home  Recommendations for Outpatient Follow-up:  1. Follow up with PCP in 1 week with repeat CBC/BMP 2. Outpatient follow-up with pulmonary and ID 3. Follow up in ED if symptoms worsen or new appear   Home Health: No Equipment/Devices: None  Discharge Condition: Stable CODE STATUS: Full Diet recommendation: Regular  Brief/Interim Summary: 69 year old female with past medical history of Mycobacterium avium complex(Dx 12/2018, follows with Dr. Lamonte Sakai) currently being treated with ethambutol, rifampin and azithromycin,history of breast cancer status post bilateral mastectomies, adenocarcinoma of the right lung status post right lower lobe lobectomy, bronchiectasis, hypertension, chronic diastolic congestive heart failure who presents to [facility] emergency department after being sent by her pulmonologist Dr.Byrum.  Patient presented to Dr. Agustina Caroli office on 02/20/2020 for follow-up and was complaining of intermittent nausea with vomiting for several weeks with very poor oral intake and approximately 15 pound weight loss recently.  She was found to have sodium of 119 for which she was asked to go to the ED.  She was started on IV fluids.  Pulmonary and ID were consulted.  Her MAC treatment was held during the hospitalization and she was treated with IV fluids.  Her symptoms gradually improved.  Sodium level is 134 today.  She feels much better.  She will be discharged home today off MAC treatment till reevaluation by ID/pulmonary.  Discharge Diagnoses:   Hyponatremia Dehydration -Presented with sodium of 119; probably from poor oral intake and dehydration -Treated with IV fluids.  Sodium level 134 today.    Currently on IV fluids.  Oral intake improving.  Discharge home today.   Outpatient follow-up of BMP within a week by PCP.  Intermittent intractable nausea and vomiting -Patient is currently on treatment for MAC with ethambutol, Zithromax and rifampin.  Unclear if her symptoms are because of intolerance to these medications.  LFTs seem normal. - right upper quadrant ultrasound showed small amount of sludge in the gallbladder. -Continue antiemetics and Protonix. -MAC treatment has been discontinued for now as per ID recommendations -Nausea and vomiting have improved.  Oral intake is poor but improving.  Continue antiemetics as needed at home.  Acute metabolic encephalopathy -Probably from hyponatremia.  Monitor mental status.  Fall precautions -Mental status has improved.  MAC infection Bronchiectasis -Diagnosed in June 2020 via endoscopy, follows with Dr.Byrum with Pulmonology and Dr. Megan Salon with ID -Per pulmonology last note, if patient continues to not be able to tolerate medications due to severe nausea and surgical options may need to be considered -CT of the chest with contrast shows chronic changes with multiple cystic cavitary lesions, bronchiectasis.  -Pulmonary and ID consultation appreciated: They have signed off.  ID recommends to hold off on MAC treatment for at least 10 to 14 days till reevaluation by ID as an outpatient. -Patient has been started on hypertonic saline nebulizers which will be continued on discharge as well.  Chronic diastolic CHF -Currently compensated.  No evidence of volume overload.  Strict input and output.  Daily weights.  Patient has been told to have regular till oral intake improves for now.  Anemia of chronic disease -Probably from her chronic illnesses.  Hemoglobin stable.  No signs of bleeding.    Outpatient follow-up  Essential hypertension -Blood pressure currently stable.    Outpatient follow-up  History of breast cancer status post  bilateral mastectomies -Continue tamoxifen.  Outpatient follow-up with  oncology  History of lung cancer status post right lower lobe lobectomy -Outpatient follow-up with oncology  Generalized deconditioning -Patient has tolerated PT.   Discharge Instructions  Discharge Instructions    Ambulatory referral to Infectious Disease   Complete by: As directed    Hospital follow-up   Ambulatory referral to Pulmonology   Complete by: As directed    Hospital follow-up   Reason for referral: Other   Diet general   Complete by: As directed    For home use only DME Nebulizer machine   Complete by: As directed    Patient needs a nebulizer to treat with the following condition: Bronchiectasis ([place])   Length of Need: Lifetime   Increase activity slowly   Complete by: As directed      Allergies as of 02/24/2020      Reactions   Taxol [paclitaxel] Anaphylaxis   Tape Itching   Abraxane [paclitaxel Protein-bound Part] Rash   Clarithromycin Rash      Medication List    STOP taking these medications   azithromycin 500 MG tablet Commonly known as: Zithromax   ethambutol 100 MG tablet Commonly known as: MYAMBUTOL   ethambutol 400 MG tablet Commonly known as: MYAMBUTOL   ibuprofen 200 MG tablet Commonly known as: ADVIL   Iron 28 MG Tabs   rifampin 300 MG capsule Commonly known as: Rifadin     TAKE these medications   acyclovir 400 MG tablet Commonly known as: ZOVIRAX Take 1 tablet (400 mg total) by mouth 2 (two) times daily.   albuterol 108 (90 Base) MCG/ACT inhaler Commonly known as: VENTOLIN HFA Inhale 2 puffs into the lungs every 6 (six) hours as needed for wheezing or shortness of breath.   folic acid 1 MG tablet Commonly known as: FOLVITE Take 1 tablet (1 mg total) by mouth daily.   gabapentin 300 MG capsule Commonly known as: NEURONTIN Take 1 capsule (300 mg total) by mouth at bedtime.   HYDROcodone-acetaminophen 5-325 MG tablet Commonly known as: Norco Take 1 tablet by mouth every 8 (eight) hours as needed for up to 5 days  for moderate pain (Take with food and take anti nausea med one half hour prior to taking pain medication).   METAMUCIL PO Take 1 Scoop by mouth daily as needed (constipation).   mirtazapine 7.5 MG tablet Commonly known as: REMERON Take 1 tablet (7.5 mg total) by mouth at bedtime.   multivitamin with minerals tablet Take 1 tablet by mouth daily.   ondansetron 4 MG tablet Commonly known as: Zofran Take 2 tablets (8 mg total) by mouth every 6 (six) hours as needed for nausea or vomiting.   pantoprazole 40 MG tablet Commonly known as: PROTONIX Take 1 tablet (40 mg total) by mouth daily. Start taking on: February 25, 2020   prochlorperazine 5 MG tablet Commonly known as: COMPAZINE Take 2 tablets (10 mg total) by mouth every 6 (six) hours as needed for nausea or vomiting.   sodium chloride HYPERTONIC 3 % nebulizer solution Take by nebulization in the morning and at bedtime.   tamoxifen 20 MG tablet Commonly known as: NOLVADEX Take 20 mg by mouth at bedtime.   vitamin B-12 500 MCG tablet Commonly known as: CYANOCOBALAMIN Take 500 mcg by mouth daily.            Durable Medical Equipment  (From admission, onward)         Start     Ordered  02/24/20 0000  For home use only DME Nebulizer machine       Question Answer Comment  Patient needs a nebulizer to treat with the following condition Bronchiectasis ([place])   Length of Need Lifetime      02/24/20 1005          Follow-up Information    Baxley, Cresenciano Lick, MD. Schedule an appointment as soon as possible for a visit in 1 week(s).   Specialty: Internal Medicine Why: With repeat CBC/BMP Contact information: [address] [place] [address] [phone]              Allergies  Allergen Reactions  . Taxol [Paclitaxel] Anaphylaxis  . Tape Itching  . Abraxane [Paclitaxel Protein-Bound Part] Rash  . Clarithromycin Rash    Consultations:  Pulmonary/ID   Procedures/Studies: DG Chest 2  View  Result Date: 02/20/2020 CLINICAL DATA:  Shortness of breath EXAM: CHEST - 2 VIEW COMPARISON:  01/08/2020.  12/02/2019. FINDINGS: Improvement compared to the study 01/08/2020. Background pattern of emphysema of with bolus disease more extensive on the right than the left. Widespread pulmonary infiltrate previously seen on the right has improved, but there are some persistent areas of density that could be some ongoing pneumonia. Small amount of pleural fluid present on the right. No acute bone finding. IMPRESSION: Improvement of widespread pulmonary infiltrate on the right. Some persistent areas of density could be ongoing pneumonia. Small right effusion. Background pattern emphysema with severe bullous disease on the right Electronically Signed   By: Nelson Chimes M.D.   On: 02/20/2020 16:04   DG Lumbar Spine 2-3 Views  Result Date: 02/20/2020 CLINICAL DATA:  Low back pain EXAM: LUMBAR SPINE - 2-3 VIEW COMPARISON:  None. FINDINGS: Degenerative facet disease in the lower lumbar spine. Disc space narrowing at L5-S1. No fracture or subluxation. SI joints symmetric and unremarkable. IMPRESSION: Degenerative changes.  No acute bony abnormality. Electronically Signed   By: Rolm Baptise M.D.   On: 02/20/2020 22:14   CT CHEST W CONTRAST  Result Date: 02/20/2020 CLINICAL DATA:  69 year old female with non-small cell lung cancer presenting with weight loss and shortness of breath. EXAM: CT CHEST WITH CONTRAST TECHNIQUE: Multidetector CT imaging of the chest was performed during intravenous contrast administration. CONTRAST:  57m OMNIPAQUE IOHEXOL 300 MG/ML  SOLN COMPARISON:  Chest CT dated 12/18/2019. FINDINGS: Cardiovascular: There is no cardiomegaly or pericardial effusion. There is coronary vascular calcification. Mild atherosclerotic calcification of the thoracic aorta. No aneurysmal dilatation or dissection. The origins of the great vessels of the aortic arch appear patent. No pulmonary artery embolus  identified. Mediastinum/Nodes: Mildly enlarged mediastinal lymph node measures 13 mm in short axis anterior to the carina. The esophagus is grossly unremarkable. No mediastinal fluid collection. Lungs/Pleura: There is postoperative changes of right lower lobectomy with associated volume loss and shift of the mediastinum into the right hemithorax. The previously described masslike consolidation in the right middle lobe appears less solid on today's exam and has been replaced by areas of cavitation and cystic changes. This likely represented an infected cavitary lesion. There is diffuse tubular bronchiectasis of the right lung. There is a mildly lobulated cavitary lesion extending from the posterior aspect of the right upper lobe inferiorly along the fissure. Several additional cavitary lesions noted throughout the right lung which appear progressed since the prior CT. An area of subpleural consolidation at the right lung base again noted similar to prior CT which may represent atelectasis or postoperative scarring versus pneumonia. There is  diffuse scattered pulmonary nodules in the right lung which may be inflammatory or infectious in etiology. Metastatic disease is favored less likely but not excluded clinical correlation is recommended. Several small scattered nodules measuring 3-4 mm noted in the left lower lobe, likely post inflammatory. A cluster of small nodular densities in the lingula with a small cavitary focus noted which appears similar to prior CT. There is no pneumothorax. Secretin is noted in the trachea and right mainstem bronchus. Upper Abdomen: There is a 2 cm right renal cyst. Musculoskeletal: There is loss of subcutaneous fat and cachexia. No acute osseous pathology. IMPRESSION: 1. Postsurgical changes of right lower lobectomy with associated volume loss and shift of the mediastinum into the right hemithorax. 2. The previously described masslike consolidation in the right middle lobe appears less  solid on today's exam and has been replaced by areas of cavitation and cystic changes. This likely represented an infected an impacted cavitary lesion. Continued follow-up recommended. 3. Multiple cystic and cavitary lesions throughout the right lung as well as diffuse right lung bronchiectasis. 4. Diffuse scattered pulmonary nodules in the right lung may be inflammatory or infectious in etiology. Metastatic disease is favored less likely but not excluded. Clinical correlation is recommended. 5. An area of subpleural consolidation at the right lung base again noted similar to prior CT which may represent atelectasis or postoperative scarring versus pneumonia. 6. Aortic Atherosclerosis (ICD10-I70.0). Electronically Signed   By: Anner Crete M.D.   On: 02/20/2020 22:39   DG Abd 2 Views  Result Date: 02/20/2020 CLINICAL DATA:  69 year old female with low back pain.  Lung cancer. EXAM: ABDOMEN - 2 VIEW COMPARISON:  PET-CT 12/31/2019. FINDINGS: Upright and supine views of the abdomen and pelvis. No pneumoperitoneum. Partially visible abnormal right lung, not significantly changed from the scout view of the PET-CT. Non obstructed bowel gas pattern. Chronic probable fibroid related dystrophic calcification in the right hemipelvis. Superimposed vascular calcifications in the abdomen and pelvis. No acute osseous abnormality identified. IMPRESSION: 1.  Normal bowel gas pattern, no free air. 2. Partially visible abnormal right lung appears grossly stable since June. Electronically Signed   By: Genevie Ann M.D.   On: 02/20/2020 22:15   OCT, Retina - OU - Both Eyes  Result Date: 01/30/2020 Right Eye Quality was good. Scan locations included subfoveal. Central Foveal Thickness: 375. Progression has been stable. Findings include epiretinal membrane. Left Eye Quality was good. Scan locations included subfoveal. Central Foveal Thickness: 274. Progression has been stable. Notes Epiretinal membrane with foveal macular schisis,  actually slightly improved as compared to last year.  This could be a change due to a deformation change in the overlying epiretinal membrane, with stable acuity will observe.  US Abdomen Limited RUQ  Result Date: 02/21/2020 CLINICAL DATA:  Nausea and vomiting for 2 weeks. EXAM: ULTRASOUND ABDOMEN LIMITED RIGHT UPPER QUADRANT COMPARISON:  None. FINDINGS: Gallbladder: No gallstones or wall thickening visualized. Small amount of sludge in the gallbladder. No sonographic Murphy sign noted by sonographer. Common bile duct: Diameter: 0.4 cm, within normal limits. Liver: No focal lesion identified. Within normal limits in parenchymal echogenicity. Portal vein is patent on color Doppler imaging with normal direction of blood flow towards the liver. Other: 2.1 cm cyst in the right kidney. IMPRESSION: No sonographic finding to explain the patient's symptoms. Small amount of sludge in the gallbladder. Electronically Signed   By: Audie Pinto M.D.   On: 02/21/2020 10:10       Subjective: Patient seen and examined  at bedside.  She feels much better and thinks that she is ready vomiting.  Denies any overnight nausea or vomiting.  Oral intake is slightly improved.  No worsening fever.  Complains of intermittent cough with some night sweats.  Discharge Exam: Vitals:   02/23/20 2036 02/24/20 0446  BP: 117/73 131/76  Pulse: 78 79  Resp: 17 17  Temp: 98.6 F (37 C) 98.2 F (36.8 C)  SpO2: 99% 98%    General: Pt is alert, awake, not in acute distress.  Very thinly built.  Looks chronically ill. Cardiovascular: rate controlled, S1/S2 + Respiratory: bilateral decreased breath sounds at bases with some scattered crackles Abdominal: Soft, NT, ND, bowel sounds + Extremities: no edema, no cyanosis    The results of significant diagnostics from this hospitalization (including imaging, microbiology, ancillary and laboratory) are listed below for reference.     Microbiology: Recent Results (from the  past 240 hour(s))  SARS Coronavirus 2 by RT PCR (hospital order, performed in [facility] hospital lab) Nasopharyngeal Nasopharyngeal Swab     Status: None   Collection Time: 02/20/20  8:23 PM   Specimen: Nasopharyngeal Swab  Result Value Ref Range Status   SARS Coronavirus 2 NEGATIVE NEGATIVE Final    Comment: (NOTE) SARS-CoV-2 target nucleic acids are NOT DETECTED.  The SARS-CoV-2 RNA is generally detectable in upper and lower respiratory specimens during the acute phase of infection. The lowest concentration of SARS-CoV-2 viral copies this assay can detect is 250 copies / mL. A negative result does not preclude SARS-CoV-2 infection and should not be used as the sole basis for treatment or other patient management decisions.  A negative result may occur with improper specimen collection / handling, submission of specimen other than nasopharyngeal swab, presence of viral mutation(s) within the areas targeted by this assay, and inadequate number of viral copies (<250 copies / mL). A negative result must be combined with clinical observations, patient history, and epidemiological information.  Fact Sheet for Patients:   StrictlyIdeas.no  Fact Sheet for Healthcare Providers: BankingDealers.co.za  This test is not yet approved or  cleared by the [place] FDA and has been authorized for detection and/or diagnosis of SARS-CoV-2 by FDA under an Emergency Use Authorization (EUA).  This EUA will remain in effect (meaning this test can be used) for the duration of the COVID-19 declaration under Section 564(b)(1) of the Act, 21 U.S.C. section 360bbb-3(b)(1), unless the authorization is terminated or revoked sooner.  Performed at [facility], [place] [address], [place], [place] [address]      Labs: BNP (last 3 results) No results for input(s): BNP in the last 8760 hours. Basic Metabolic Panel: Recent Labs  Lab 02/20/20 2130  02/21/20 0130 02/21/20 0540 02/21/20 0540 02/21/20 0838 02/21/20 1344 02/22/20 0412 02/22/20 0844 02/23/20 0249 02/24/20 0818  NA 119*   < > 123*   < > 123* 122*  --  127* 129* 134*  K 3.7   < > 4.5   < > 4.4 3.9  --  3.8 3.7 3.6  CL 89*   < > 92*   < > 91* 92*  --  92* 98 100  CO2 20*   < > 21*   < > 23 22  --  _0 GLUCOSE 101*   < > 93   < > 91 128*  --  88 95 100*  BUN 7*   < > 5*   < > 5* <5*  --  <  5* <5* <5*  CREATININE 0.47   < > 0.50   < > 0.51 0.46  --  0.53 0.52 0.46  CALCIUM 8.2*   < > 7.9*   < > 8.3* 8.0*  --  8.6* 8.0* 8.2*  MG 1.6*  --  1.7  --   --   --  1.7  --  1.8 1.7   < > = values in this interval not displayed.   Liver Function Tests: Recent Labs  Lab 02/17/20 1638 02/20/20 1921  AST 55* 29  ALT 57* 43  ALKPHOS 113 83  BILITOT 1.0 0.6  PROT 7.7 6.3*  ALBUMIN 2.9* 2.6*   Recent Labs  Lab 02/20/20 2130  LIPASE 32   No results for input(s): AMMONIA in the last 168 hours. CBC: Recent Labs  Lab 02/17/20 1638 02/20/20 1612 02/21/20 0540 02/22/20 0412  WBC 10.7* 9.1 9.3 5.2  NEUTROABS 9.3*  --  7.9* 3.9  HGB 10.4* 9.7* 8.3* 8.7*  HCT 33.3* 31.1* 26.4* 26.3*  MCV 81.6 81.6 80.7 81.9  PLT 370 431* 358 430*   Cardiac Enzymes: No results for input(s): CKTOTAL, CKMB, CKMBINDEX, TROPONINI in the last 168 hours. BNP: Invalid input(s): POCBNP CBG: No results for input(s): GLUCAP in the last 168 hours. D-Dimer No results for input(s): DDIMER in the last 72 hours. Hgb A1c No results for input(s): HGBA1C in the last 72 hours. Lipid Profile No results for input(s): CHOL, HDL, LDLCALC, TRIG, CHOLHDL, LDLDIRECT in the last 72 hours. Thyroid function studies No results for input(s): TSH, T4TOTAL, T3FREE, THYROIDAB in the last 72 hours.  Invalid input(s): FREET3 Anemia work up No results for input(s): VITAMINB12, FOLATE, FERRITIN, TIBC, IRON, RETICCTPCT in the last 72 hours. Urinalysis    Component Value Date/Time   COLORURINE AMBER (A)  02/21/2020 0456   APPEARANCEUR CLEAR 02/21/2020 0456   LABSPEC 1.030 02/21/2020 0456   PHURINE 6.0 02/21/2020 0456   GLUCOSEU NEGATIVE 02/21/2020 0456   HGBUR NEGATIVE 02/21/2020 0456   BILIRUBINUR NEGATIVE 02/21/2020 0456   BILIRUBINUR NEG 12/17/2019 1654   KETONESUR 20 (A) 02/21/2020 0456   PROTEINUR NEGATIVE 02/21/2020 0456   UROBILINOGEN 0.2 12/17/2019 1654   NITRITE NEGATIVE 02/21/2020 0456   LEUKOCYTESUR NEGATIVE 02/21/2020 0456   Sepsis Labs Invalid input(s): PROCALCITONIN,  WBC,  LACTICIDVEN Microbiology Recent Results (from the past 240 hour(s))  SARS Coronavirus 2 by RT PCR (hospital order, performed in [place] hospital lab) Nasopharyngeal Nasopharyngeal Swab     Status: None   Collection Time: 02/20/20  8:23 PM   Specimen: Nasopharyngeal Swab  Result Value Ref Range Status   SARS Coronavirus 2 NEGATIVE NEGATIVE Final    Comment: (NOTE) SARS-CoV-2 target nucleic acids are NOT DETECTED.  The SARS-CoV-2 RNA is generally detectable in upper and lower respiratory specimens during the acute phase of infection. The lowest concentration of SARS-CoV-2 viral copies this assay can detect is 250 copies / mL. A negative result does not preclude SARS-CoV-2 infection and should not be used as the sole basis for treatment or other patient management decisions.  A negative result may occur with improper specimen collection / handling, submission of specimen other than nasopharyngeal swab, presence of viral mutation(s) within the areas targeted by this assay, and inadequate number of viral copies (<250 copies / mL). A negative result must be combined with clinical observations, patient history, and epidemiological information.  Fact Sheet for Patients:   StrictlyIdeas.no  Fact Sheet for Healthcare Providers: BankingDealers.co.za  This test is not  yet approved or  cleared by the [place] and has been authorized for  detection and/or diagnosis of SARS-CoV-2 by FDA under an Emergency Use Authorization (EUA).  This EUA will remain in effect (meaning this test can be used) for the duration of the COVID-19 declaration under Section 564(b)(1) of the Act, 21 U.S.C. section 360bbb-3(b)(1), unless the authorization is terminated or revoked sooner.  Performed at [facility], [place] [address] [place], [place] [address]      Time coordinating discharge: 35 minutes  SIGNED:   Aline August, MD  [facility] 02/24/2020, 10:06 AM

## 2020-02-24 NOTE — Progress Notes (Signed)
Patient discharged to home. Verbalizes understanding of all discharge instructions including discharge medications and follow up MD visits. Awaiting delivery of DME neb machine.

## 2020-02-25 ENCOUNTER — Telehealth: Payer: Self-pay | Admitting: Internal Medicine

## 2020-02-25 ENCOUNTER — Telehealth: Payer: Self-pay | Admitting: Emergency Medicine

## 2020-02-25 MED ORDER — SODIUM CHLORIDE 3 % IN NEBU: INHALATION_SOLUTION | Freq: Two times a day (BID) | RESPIRATORY_TRACT | 0 refills | Status: DC

## 2020-02-25 NOTE — Telephone Encounter (Signed)
Called and spoke with pt who is requesting to have her hypertonic neb sol refilled. Pt states that this was prescribed for her when she was in the hospital. Instructions state for pt to use this twice daily.  Dr. Lamonte Sakai, please advise if you are okay with [place] refilling med for pt and if pt should take it twice daily as recently prescribed.

## 2020-02-25 NOTE — Telephone Encounter (Signed)
Yes I am ok with refilling  Also need to insure that she has a follow up OV with me or APP soon

## 2020-02-25 NOTE — Telephone Encounter (Addendum)
Anna Valentine called to schedule her hospital follow up, she was discharged yesterday. I scheduled it for 03/03/2020, and let her know you would call probably tomorrow go over medication and her discharge.

## 2020-02-26 NOTE — Telephone Encounter (Signed)
Ct calling to see if there is a alternative nebulizer medication that is a little cheaper because the medication that she was prescribed is $50/month. Also states that she has been having joint pain since stopping the abx and wants to know if that is to be expected. Pt can be reached at [phone].

## 2020-02-26 NOTE — Telephone Encounter (Signed)
Transition Care Management Follow-up Telephone Call  Date of discharge and from where: 02/21/20 [facility]   How have you been since you were released from the hospital? OKAY  Any questions or concerns? No  Items Reviewed:  Did the pt receive and understand the discharge instructions provided? Yes   Medications obtained and verified? Yes   Any new allergies since your discharge? No   Dietary orders reviewed? Yes  Do you have support at home? Yes  (has home health orders)  Functional Questionnaire: (I = Independent and D = Dependent) ADLs: I  Bathing/Dressing- I  Meal Prep- I  Eating- I  Maintaining continence- I  Transferring/Ambulation- I  Managing Meds- I  Follow up appointments reviewed:   PCP Hospital f/u appt confirmed? Yes  Scheduled to see Dr. Renold Genta on 03/03/20 @ 12:00pm.  [facility] f/u appt confirmed? Yes  Scheduled to see PA at Pulmonology on 02/28/20 @ 12:00pm.  Are transportation arrangements needed? No   If their condition worsens, is the pt aware to call PCP or go to the Emergency Dept.? Yes  Was the patient provided with contact information for the PCP's office or ED? Yes  Was to pt encouraged to call back with questions or concerns? Yes, patient was thankful that I called to check on her.

## 2020-02-26 NOTE — Telephone Encounter (Signed)
Called and spoke with pt letting her know that she could discuss with Beth at upcoming OV 8/20 to see if an alternate nebulizer medication could be prescribed and she verbalized understanding.  While speaking with pt, she stated that she started noticing joint pain about 2 days ago and wonders if it could be due to having abx stopped. Pt stated that it started in her hands but now states that the pain is all over her body. Pt stated when she was admitted to the hospital 8/12, abx were stopped almost immediately after admission.  Pt wants to know recommendations due to this. Dr. Lamonte Sakai, please advise.

## 2020-02-27 DIAGNOSIS — E860 Dehydration: Secondary | ICD-10-CM | POA: Diagnosis not present

## 2020-02-27 DIAGNOSIS — Z792 Long term (current) use of antibiotics: Secondary | ICD-10-CM | POA: Diagnosis not present

## 2020-02-27 DIAGNOSIS — K219 Gastro-esophageal reflux disease without esophagitis: Secondary | ICD-10-CM | POA: Diagnosis not present

## 2020-02-27 DIAGNOSIS — M810 Age-related osteoporosis without current pathological fracture: Secondary | ICD-10-CM | POA: Diagnosis not present

## 2020-02-27 DIAGNOSIS — E559 Vitamin D deficiency, unspecified: Secondary | ICD-10-CM | POA: Diagnosis not present

## 2020-02-27 DIAGNOSIS — E871 Hypo-osmolality and hyponatremia: Secondary | ICD-10-CM | POA: Diagnosis not present

## 2020-02-27 DIAGNOSIS — G9341 Metabolic encephalopathy: Secondary | ICD-10-CM | POA: Diagnosis not present

## 2020-02-27 DIAGNOSIS — Z9013 Acquired absence of bilateral breasts and nipples: Secondary | ICD-10-CM | POA: Diagnosis not present

## 2020-02-27 DIAGNOSIS — Z853 Personal history of malignant neoplasm of breast: Secondary | ICD-10-CM | POA: Diagnosis not present

## 2020-02-27 DIAGNOSIS — M8580 Other specified disorders of bone density and structure, unspecified site: Secondary | ICD-10-CM | POA: Diagnosis not present

## 2020-02-27 DIAGNOSIS — R918 Other nonspecific abnormal finding of lung field: Secondary | ICD-10-CM | POA: Diagnosis not present

## 2020-02-27 DIAGNOSIS — I700 Atherosclerosis of aorta: Secondary | ICD-10-CM | POA: Diagnosis not present

## 2020-02-27 DIAGNOSIS — D638 Anemia in other chronic diseases classified elsewhere: Secondary | ICD-10-CM | POA: Diagnosis not present

## 2020-02-27 DIAGNOSIS — I5032 Chronic diastolic (congestive) heart failure: Secondary | ICD-10-CM | POA: Diagnosis not present

## 2020-02-27 DIAGNOSIS — Z85118 Personal history of other malignant neoplasm of bronchus and lung: Secondary | ICD-10-CM | POA: Diagnosis not present

## 2020-02-27 DIAGNOSIS — J479 Bronchiectasis, uncomplicated: Secondary | ICD-10-CM | POA: Diagnosis not present

## 2020-02-27 DIAGNOSIS — R112 Nausea with vomiting, unspecified: Secondary | ICD-10-CM | POA: Diagnosis not present

## 2020-02-27 DIAGNOSIS — N281 Cyst of kidney, acquired: Secondary | ICD-10-CM | POA: Diagnosis not present

## 2020-02-27 DIAGNOSIS — R634 Abnormal weight loss: Secondary | ICD-10-CM | POA: Diagnosis not present

## 2020-02-27 DIAGNOSIS — J439 Emphysema, unspecified: Secondary | ICD-10-CM | POA: Diagnosis not present

## 2020-02-27 DIAGNOSIS — I110 Hypertensive heart disease with heart failure: Secondary | ICD-10-CM | POA: Diagnosis not present

## 2020-02-27 DIAGNOSIS — Z79899 Other long term (current) drug therapy: Secondary | ICD-10-CM | POA: Diagnosis not present

## 2020-02-27 DIAGNOSIS — A312 Disseminated mycobacterium avium-intracellulare complex (DMAC): Secondary | ICD-10-CM | POA: Diagnosis not present

## 2020-02-27 DIAGNOSIS — D509 Iron deficiency anemia, unspecified: Secondary | ICD-10-CM | POA: Diagnosis not present

## 2020-02-27 DIAGNOSIS — M47816 Spondylosis without myelopathy or radiculopathy, lumbar region: Secondary | ICD-10-CM | POA: Diagnosis not present

## 2020-02-27 NOTE — Telephone Encounter (Signed)
I think unlikely that there is a connection between stopping the abx and the joint pain, although suppose there could be increased overall inflammation present as the abx were held. I think we will have to follow it, use symptomatic relief like tylenol / ibuprofen / the pain meds that were recently prescribed by Dr Renold Genta.

## 2020-02-27 NOTE — Telephone Encounter (Signed)
Spoke with pt and advised of recommendations per Dr Lamonte Sakai. PT verbalized understanding. Nothing further needed at this time.

## 2020-02-28 ENCOUNTER — Ambulatory Visit (INDEPENDENT_AMBULATORY_CARE_PROVIDER_SITE_OTHER): Payer: Medicare Other | Admitting: Primary Care

## 2020-02-28 ENCOUNTER — Other Ambulatory Visit: Payer: Self-pay

## 2020-02-28 ENCOUNTER — Encounter: Payer: Self-pay | Admitting: Primary Care

## 2020-02-28 VITALS — BP 112/74 | HR 97 | Temp 98.0°F | Ht 66.0 in | Wt 99.4 lb

## 2020-02-28 DIAGNOSIS — E871 Hypo-osmolality and hyponatremia: Secondary | ICD-10-CM | POA: Diagnosis not present

## 2020-02-28 DIAGNOSIS — R202 Paresthesia of skin: Secondary | ICD-10-CM | POA: Diagnosis not present

## 2020-02-28 DIAGNOSIS — R21 Rash and other nonspecific skin eruption: Secondary | ICD-10-CM

## 2020-02-28 DIAGNOSIS — A310 Pulmonary mycobacterial infection: Secondary | ICD-10-CM | POA: Diagnosis not present

## 2020-02-28 LAB — BASIC METABOLIC PANEL
BUN: 13 mg/dL (ref 6–23)
CO2: 28 mEq/L (ref 19–32)
Calcium: 9.4 mg/dL (ref 8.4–10.5)
Chloride: 97 mEq/L (ref 96–112)
Creatinine, Ser: 0.62 mg/dL (ref 0.40–1.20)
GFR: 95.59 mL/min (ref >60.00)
Glucose, Bld: 92 mg/dL (ref 70–99)
Potassium: 4.3 mEq/L (ref 3.5–5.1)
Sodium: 133 mEq/L — ABNORMAL LOW (ref 135–145)

## 2020-02-28 LAB — VITAMIN B12: Vitamin B-12: 221 pg/mL (ref 211–911)

## 2020-02-28 NOTE — Assessment & Plan Note (Signed)
-  Patient developed diffuse erythematous rash to abdomen and back 1 day ago.  She denies any new medications.  She was recently started on Remeron and gabapentin 3 weeks ago for sleep disturbance and night sweats.  Recommend patient hold medication and contact oncology for possible side effect from medication.  If symptoms worsen present to ED for further evaluation.

## 2020-02-28 NOTE — Progress Notes (Signed)
_0  ID: Anna Valentine, female    DOB: Nov 13, 1950, 68 y.o.   MRN: [id number]  Chief Complaint  Patient presents with  . Hospitalization Follow-up    Referring provider: [name], MD  HPI: 69 year old female, former smoker. PMH significant for bronchiectasis, mycobacterium avium complex (diagnosed June 2020-on triple therapy with ID).  Patient of Dr. Lamonte Sakai, most recently seen in office on 02/20/2020.  During office visit she was found to have low sodium levels and was directed to present to emergency room.  Patient had reported intermittent nausea and vomiting for several weeks with very poor oral intake, approximate 15 pound weight loss.  She was found to have a sodium level of 119.  She was started on IV fluids.  Pulmonary and infectious disease were consulted.  Her MAC treatment was held during her hospitalization and she was discharged home with instructions to continue to hold treatment for at least 10 to 14 days till reevaluation by ID outpatient.  She was started on hypertonic saline nebulizers upon discharge.  CT chest with contrast showed chronic changes with multiple cystic cavitary lesions, bronchiectasis.  Patient continues to not be able to tolerate medications due to severe nausea, surgical options may need to be considered.  02/28/2020-interim history Patient presents today for hospital follow-up.  Accompanied by female friend.  Patient was discharged from hospital 4 days ago for hyponatremia.  Breathing wise she is feeling ok.  Triple therapy for MAC continues to be on hold.  She does report numberness/tingling in her hands with joint pain described as "achy".  She also has a rash to her abdomen that started last night. She is not aware of any new medications, however, she was recently started on Remeron to help with sleep and gabapentin for night sweats x 3 weeks. She did not fill prescription for hypertonic saline. She has an apt with ID on 8/30 and PCP next tuesday . She  is eating and drinking normally. Denies nausea, vomitting or dirrhea.    Allergies  Allergen Reactions  . Taxol [Paclitaxel] Anaphylaxis  . Tape Itching  . Abraxane [Paclitaxel Protein-Bound Part] Rash  . Clarithromycin Rash    Immunization History  Administered Date(s) Administered  . Influenza Split 04/13/2011, 05/02/2012  . Influenza,inj,Quad PF,6+ Mos 04/10/2013, 04/23/2014, 05/06/2015, 04/08/2016, 04/21/2017, 04/20/2018, 04/19/2019  . Influenza-Unspecified 04/10/2013, 04/23/2014, 05/06/2015, 04/08/2016, 04/21/2017  . PFIZER SARS-COV-2 Vaccination 08/24/2019, 09/18/2019  . Pneumococcal Conjugate-13 09/07/2018  . Pneumococcal Polysaccharide-23 06/14/2017  . Tdap 04/07/2003, 05/14/2013  . Zoster 12/19/2013    Past Medical History:  Diagnosis Date  . Anemia   . BRCA negative 2011   BRCA I/ II negative  . Breast cancer ([place]) 08/2009   stage 2, rx with lumpectomy and xrt  . Bronchiectasis ([place])   . Chronic diastolic CHF (congestive heart failure) ([place]) 02/20/2020  . Constipation   . Essential hypertension 02/20/2020  . Family history of adverse reaction to anesthesia    My Sister has nausea  . Family history of breast cancer   . Family history of colon cancer   . Family history of lung cancer   . GERD (gastroesophageal reflux disease)    not presently having symptoms  . Hypertension   . Iatrogenic pneumothorax 02/06/2019  . Lung cancer ([place]) 06/06/05   stage 1 poorly differentiated adenocarcinoma, s/p right lower lobectomy.  . Osteopenia 09/13/2013  . Osteoporosis   . Personal history of lung cancer   . Pneumonia    2007ish, walking pnemonia -  2009 ish  . STD (sexually transmitted disease)    HSV  . Vitamin D deficiency 09/13/2013    Tobacco History: Social History   Tobacco Use  Smoking Status Former Smoker  . Packs/day: 2.00  . Years: 18.00  . Pack years: 36.00  . Quit date: 07/12/1987  . Years since quitting: 32.6  Smokeless Tobacco Never Used   Counseling  given: Not Answered   Outpatient Medications Prior to Visit  Medication Sig Dispense Refill  . acyclovir (ZOVIRAX) 400 MG tablet Take 1 tablet (400 mg total) by mouth 2 (two) times daily.    Marland Kitchen albuterol (VENTOLIN HFA) 108 (90 Base) MCG/ACT inhaler Inhale 2 puffs into the lungs every 6 (six) hours as needed for wheezing or shortness of breath. 8 g prn  . carvedilol (COREG) 6.25 MG tablet Take 6.25 mg by mouth 2 (two) times daily.    . folic acid (FOLVITE) 1 MG tablet Take 1 tablet (1 mg total) by mouth daily. 100 tablet 6  . gabapentin (NEURONTIN) 300 MG capsule Take 1 capsule (300 mg total) by mouth at bedtime. 90 capsule 4  . mirtazapine (REMERON) 7.5 MG tablet Take 1 tablet (7.5 mg total) by mouth at bedtime. 60 tablet 6  . Multiple Vitamins-Minerals (MULTIVITAMIN WITH MINERALS) tablet Take 1 tablet by mouth daily.    . ondansetron (ZOFRAN) 4 MG tablet Take 2 tablets (8 mg total) by mouth every 6 (six) hours as needed for nausea or vomiting. 200 tablet 2  . pantoprazole (PROTONIX) 40 MG tablet Take 1 tablet (40 mg total) by mouth daily. 30 tablet 0  . prochlorperazine (COMPAZINE) 5 MG tablet Take 2 tablets (10 mg total) by mouth every 6 (six) hours as needed for nausea or vomiting. 60 tablet 6  . Psyllium (METAMUCIL PO) Take 1 Scoop by mouth daily as needed (constipation).      . sodium chloride HYPERTONIC 3 % nebulizer solution SMARTSIG:Via Nebulizer Morning-Night    . tamoxifen (NOLVADEX) 20 MG tablet Take 20 mg by mouth at bedtime.     . vitamin B-12 (CYANOCOBALAMIN) 500 MCG tablet Take 500 mcg by mouth daily.      No facility-administered medications prior to visit.    Review of Systems  Review of Systems  Respiratory: Negative for cough, shortness of breath and wheezing.   Musculoskeletal: Positive for arthralgias.  Skin: Positive for rash.  Neurological: Positive for numbness.   Physical Exam  BP 112/74 (BP Location: Left Arm, Cuff Size: Normal)   Pulse 97   Temp 98 F  (36.7 C) (Oral)   Ht _0  (1.676 m)   Wt 99 lb 6.4 oz (45.1 kg)   SpO2 98%   BMI 16.04 kg/m  Physical Exam Constitutional:      Appearance: Normal appearance.  HENT:     Mouth/Throat:     Mouth: Mucous membranes are moist.     Pharynx: Oropharynx is clear.  Cardiovascular:     Rate and Rhythm: Normal rate and regular rhythm.  Pulmonary:     Effort: Pulmonary effort is normal.     Breath sounds: Normal breath sounds. No wheezing or rhonchi.     Comments: CTA Skin:    Comments: Diffuse erythematous rash to abdomen and back   Neurological:     General: No focal deficit present.     Mental Status: She is alert and oriented to person, place, and time. Mental status is at baseline.  Psychiatric:        Mood  and Affect: Mood normal.        Behavior: Behavior normal.        Thought Content: Thought content normal.        Judgment: Judgment normal.      Lab Results:  CBC    Component Value Date/Time   WBC 5.2 02/22/2020 0412   RBC 3.21 (L) 02/22/2020 0412   HGB 8.7 (L) 02/22/2020 0412   HGB 8.1 (L) 02/05/2020 1525   HGB 11.9 12/22/2016 1141   HCT 26.3 (L) 02/22/2020 0412   HCT 35.8 12/22/2016 1141   PLT 430 (H) 02/22/2020 0412   PLT 412 (H) 02/05/2020 1525   PLT 275 12/22/2016 1141   MCV 81.9 02/22/2020 0412   MCV 93.2 12/22/2016 1141   MCH 27.1 02/22/2020 0412   MCHC 33.1 02/22/2020 0412   RDW 17.7 (H) 02/22/2020 0412   RDW 13.0 12/22/2016 1141   LYMPHSABS 0.6 (L) 02/22/2020 0412   LYMPHSABS 0.8 (L) 12/22/2016 1141   MONOABS 0.6 02/22/2020 0412   MONOABS 0.5 12/22/2016 1141   EOSABS 0.1 02/22/2020 0412   EOSABS 0.1 12/22/2016 1141   EOSABS 0.1 02/03/2010 1007   BASOSABS 0.0 02/22/2020 0412   BASOSABS 0.0 12/22/2016 1141    BMET    Component Value Date/Time   NA 134 (L) 02/24/2020 0818   NA 139 12/22/2016 1141   K 3.6 02/24/2020 0818   K 4.2 12/22/2016 1141   CL 100 02/24/2020 0818   CL 103 11/22/2012 1025   CO2 26 02/24/2020 0818   CO2 28 12/22/2016  1141   GLUCOSE 100 (H) 02/24/2020 0818   GLUCOSE 92 12/22/2016 1141   GLUCOSE 94 11/22/2012 1025   BUN <5 (L) 02/24/2020 0818   BUN 13.2 12/22/2016 1141   CREATININE 0.46 02/24/2020 0818   CREATININE 0.78 02/05/2020 1525   CREATININE 0.69 12/17/2019 1654   CREATININE 0.7 12/22/2016 1141   CALCIUM 8.2 (L) 02/24/2020 0818   CALCIUM 9.7 12/22/2016 1141   GFRNONAA >60 02/24/2020 0818   GFRNONAA >60 02/05/2020 1525   GFRNONAA 89 12/17/2019 1654   GFRAA >60 02/24/2020 0818   GFRAA >60 02/05/2020 1525   GFRAA 104 12/17/2019 1654    BNP No results found for: BNP  ProBNP No results found for: PROBNP  Imaging: DG Chest 2 View  Result Date: 02/20/2020 CLINICAL DATA:  Shortness of breath EXAM: CHEST - 2 VIEW COMPARISON:  01/08/2020.  12/02/2019. FINDINGS: Improvement compared to the study 01/08/2020. Background pattern of emphysema of with bolus disease more extensive on the right than the left. Widespread pulmonary infiltrate previously seen on the right has improved, but there are some persistent areas of density that could be some ongoing pneumonia. Small amount of pleural fluid present on the right. No acute bone finding. IMPRESSION: Improvement of widespread pulmonary infiltrate on the right. Some persistent areas of density could be ongoing pneumonia. Small right effusion. Background pattern emphysema with severe bullous disease on the right Electronically Signed   By: Nelson Chimes M.D.   On: 02/20/2020 16:04   DG Lumbar Spine 2-3 Views  Result Date: 02/20/2020 CLINICAL DATA:  Low back pain EXAM: LUMBAR SPINE - 2-3 VIEW COMPARISON:  None. FINDINGS: Degenerative facet disease in the lower lumbar spine. Disc space narrowing at L5-S1. No fracture or subluxation. SI joints symmetric and unremarkable. IMPRESSION: Degenerative changes.  No acute bony abnormality. Electronically Signed   By: Rolm Baptise M.D.   On: 02/20/2020 22:14   CT CHEST W CONTRAST  Result Date: 02/20/2020 CLINICAL DATA:   69 year old female with non-small cell lung cancer presenting with weight loss and shortness of breath. EXAM: CT CHEST WITH CONTRAST TECHNIQUE: Multidetector CT imaging of the chest was performed during intravenous contrast administration. CONTRAST:  47m OMNIPAQUE IOHEXOL 300 MG/ML  SOLN COMPARISON:  Chest CT dated 12/18/2019. FINDINGS: Cardiovascular: There is no cardiomegaly or pericardial effusion. There is coronary vascular calcification. Mild atherosclerotic calcification of the thoracic aorta. No aneurysmal dilatation or dissection. The origins of the great vessels of the aortic arch appear patent. No pulmonary artery embolus identified. Mediastinum/Nodes: Mildly enlarged mediastinal lymph node measures 13 mm in short axis anterior to the carina. The esophagus is grossly unremarkable. No mediastinal fluid collection. Lungs/Pleura: There is postoperative changes of right lower lobectomy with associated volume loss and shift of the mediastinum into the right hemithorax. The previously described masslike consolidation in the right middle lobe appears less solid on today's exam and has been replaced by areas of cavitation and cystic changes. This likely represented an infected cavitary lesion. There is diffuse tubular bronchiectasis of the right lung. There is a mildly lobulated cavitary lesion extending from the posterior aspect of the right upper lobe inferiorly along the fissure. Several additional cavitary lesions noted throughout the right lung which appear progressed since the prior CT. An area of subpleural consolidation at the right lung base again noted similar to prior CT which may represent atelectasis or postoperative scarring versus pneumonia. There is diffuse scattered pulmonary nodules in the right lung which may be inflammatory or infectious in etiology. Metastatic disease is favored less likely but not excluded clinical correlation is recommended. Several small scattered nodules measuring 3-4 mm  noted in the left lower lobe, likely post inflammatory. A cluster of small nodular densities in the lingula with a small cavitary focus noted which appears similar to prior CT. There is no pneumothorax. Secretin is noted in the trachea and right mainstem bronchus. Upper Abdomen: There is a 2 cm right renal cyst. Musculoskeletal: There is loss of subcutaneous fat and cachexia. No acute osseous pathology. IMPRESSION: 1. Postsurgical changes of right lower lobectomy with associated volume loss and shift of the mediastinum into the right hemithorax. 2. The previously described masslike consolidation in the right middle lobe appears less solid on today's exam and has been replaced by areas of cavitation and cystic changes. This likely represented an infected an impacted cavitary lesion. Continued follow-up recommended. 3. Multiple cystic and cavitary lesions throughout the right lung as well as diffuse right lung bronchiectasis. 4. Diffuse scattered pulmonary nodules in the right lung may be inflammatory or infectious in etiology. Metastatic disease is favored less likely but not excluded. Clinical correlation is recommended. 5. An area of subpleural consolidation at the right lung base again noted similar to prior CT which may represent atelectasis or postoperative scarring versus pneumonia. 6. Aortic Atherosclerosis (ICD10-I70.0). Electronically Signed   By: AAnner CreteM.D.   On: 02/20/2020 22:39   DG Abd 2 Views  Result Date: 02/20/2020 CLINICAL DATA:  69year old female with low back pain.  Lung cancer. EXAM: ABDOMEN - 2 VIEW COMPARISON:  PET-CT 12/31/2019. FINDINGS: Upright and supine views of the abdomen and pelvis. No pneumoperitoneum. Partially visible abnormal right lung, not significantly changed from the scout view of the PET-CT. Non obstructed bowel gas pattern. Chronic probable fibroid related dystrophic calcification in the right hemipelvis. Superimposed vascular calcifications in the abdomen and  pelvis. No acute osseous abnormality identified. IMPRESSION: 1.  Normal bowel gas  pattern, no free air. 2. Partially visible abnormal right lung appears grossly stable since June. Electronically Signed   By: Genevie Ann M.D.   On: 02/20/2020 22:15   OCT, Retina - OU - Both Eyes  Result Date: 01/30/2020 Right Eye Quality was good. Scan locations included subfoveal. Central Foveal Thickness: 375. Progression has been stable. Findings include epiretinal membrane. Left Eye Quality was good. Scan locations included subfoveal. Central Foveal Thickness: 274. Progression has been stable. Notes Epiretinal membrane with foveal macular schisis, actually slightly improved as compared to last year.  This could be a change due to a deformation change in the overlying epiretinal membrane, with stable acuity will observe.  US Abdomen Limited RUQ  Result Date: 02/21/2020 CLINICAL DATA:  Nausea and vomiting for 2 weeks. EXAM: ULTRASOUND ABDOMEN LIMITED RIGHT UPPER QUADRANT COMPARISON:  None. FINDINGS: Gallbladder: No gallstones or wall thickening visualized. Small amount of sludge in the gallbladder. No sonographic Murphy sign noted by sonographer. Common bile duct: Diameter: 0.4 cm, within normal limits. Liver: No focal lesion identified. Within normal limits in parenchymal echogenicity. Portal vein is patent on color Doppler imaging with normal direction of blood flow towards the liver. Other: 2.1 cm cyst in the right kidney. IMPRESSION: No sonographic finding to explain the patient's symptoms. Small amount of sludge in the gallbladder. Electronically Signed   By: Audie Pinto M.D.   On: 02/21/2020 10:10     Assessment & Plan:   Hyponatremia -Patient is status post hospitalization for hyponatremia from 8/12-8/16/2021.  Likely Secondary to nausea/vomiting. Patient reports new paresthesia in her hands and arthralgia.  Nausea vomiting has resolved.  She is eating and drinking normally.  Recommend checking BMET, ANA and  B12 level today   Mycobacterium avium infection ([place]) -Rifampin, azithromycin, ethambutol are currently on hold secondary to nausea and vomiting for 10 to 14 days until follow-up with infectious disease  -Patient has follow-up scheduled with ID on 03/09/2020  Skin rash -Patient developed diffuse erythematous rash to abdomen and back 1 day ago.  She denies any new medications.  She was recently started on Remeron and gabapentin 3 weeks ago for sleep disturbance and night sweats.  Recommend patient hold medication and contact oncology for possible side effect from medication.  If symptoms worsen present to ED for further evaluation.   Martyn Ehrich, NP 02/28/2020

## 2020-02-28 NOTE — Assessment & Plan Note (Signed)
-  Patient is status post hospitalization for hyponatremia from 8/12-8/16/2021.  Likely Secondary to nausea/vomiting. Patient reports new paresthesia in her hands and arthralgia.  Nausea vomiting has resolved.  She is eating and drinking normally.  Recommend checking BMET, ANA and B12 level today

## 2020-02-28 NOTE — Patient Instructions (Addendum)
Nice seeing you today Ms Anna Valentine  I recommend checking labs today to monitor your sodium and B12 level. I also would hold Remeron and gabapentin d/t potential medication reaction. Please notify oncology to notify them.   Orders: We will send in prescription for hypertonic saline nebulizer to a DME (durable medical equipment) company- may be more affordable if processed under part D of medicare   Follow-up: Apt scheduled for 8/26 with Dr. [name], you can keep this visit or push out 4-6 weeks

## 2020-02-28 NOTE — Assessment & Plan Note (Signed)
-  Rifampin, azithromycin, ethambutol are currently on hold secondary to nausea and vomiting for 10 to 14 days until follow-up with infectious disease  -Patient has follow-up scheduled with ID on 03/09/2020

## 2020-03-02 LAB — ANA: Anti Nuclear Antibody (ANA): NEGATIVE

## 2020-03-03 ENCOUNTER — Encounter: Payer: Self-pay | Admitting: Oncology

## 2020-03-03 ENCOUNTER — Encounter: Payer: Self-pay | Admitting: Internal Medicine

## 2020-03-03 ENCOUNTER — Ambulatory Visit (INDEPENDENT_AMBULATORY_CARE_PROVIDER_SITE_OTHER): Payer: Medicare Other | Admitting: Internal Medicine

## 2020-03-03 ENCOUNTER — Other Ambulatory Visit: Payer: Self-pay

## 2020-03-03 VITALS — BP 110/80 | HR 90 | Temp 99.4°F | Ht 66.0 in | Wt 95.0 lb

## 2020-03-03 DIAGNOSIS — E871 Hypo-osmolality and hyponatremia: Secondary | ICD-10-CM | POA: Diagnosis not present

## 2020-03-03 DIAGNOSIS — R54 Age-related physical debility: Secondary | ICD-10-CM

## 2020-03-03 DIAGNOSIS — Z8619 Personal history of other infectious and parasitic diseases: Secondary | ICD-10-CM

## 2020-03-03 DIAGNOSIS — R627 Adult failure to thrive: Secondary | ICD-10-CM | POA: Diagnosis not present

## 2020-03-03 DIAGNOSIS — R531 Weakness: Secondary | ICD-10-CM

## 2020-03-03 DIAGNOSIS — Z853 Personal history of malignant neoplasm of breast: Secondary | ICD-10-CM

## 2020-03-03 DIAGNOSIS — Z85118 Personal history of other malignant neoplasm of bronchus and lung: Secondary | ICD-10-CM

## 2020-03-03 MED ORDER — CYANOCOBALAMIN 1000 MCG/ML IJ SOLN
1000.0000 ug | Freq: Once | INTRAMUSCULAR | Status: AC
  Administered 2020-03-03: 1000 ug via INTRAMUSCULAR

## 2020-03-04 LAB — COMPLETE METABOLIC PANEL WITH GFR
AG Ratio: 1.1 (calc) (ref 1.0–2.5)
ALT: 41 U/L — ABNORMAL HIGH (ref 6–29)
AST: 37 U/L — ABNORMAL HIGH (ref 10–35)
Albumin: 3.4 g/dL — ABNORMAL LOW (ref 3.6–5.1)
Alkaline phosphatase (APISO): 96 U/L (ref 37–153)
BUN: 14 mg/dL (ref 7–25)
CO2: 24 mmol/L (ref 20–32)
Calcium: 8.6 mg/dL (ref 8.6–10.4)
Chloride: 97 mmol/L — ABNORMAL LOW (ref 98–110)
Creat: 0.56 mg/dL (ref 0.50–0.99)
GFR, Est African American: 111 mL/min/{1.73_m2} (ref >=60)
GFR, Est Non African American: 96 mL/min/{1.73_m2} (ref >=60)
Globulin: 3.2 g/dL (calc) (ref 1.9–3.7)
Glucose, Bld: 79 mg/dL (ref 65–99)
Potassium: 4.7 mmol/L (ref 3.5–5.3)
Sodium: 133 mmol/L — ABNORMAL LOW (ref 135–146)
Total Bilirubin: 0.4 mg/dL (ref 0.2–1.2)
Total Protein: 6.6 g/dL (ref 6.1–8.1)

## 2020-03-04 LAB — CBC WITH DIFFERENTIAL/PLATELET
Absolute Monocytes: 518 cells/uL (ref 200–950)
Basophils Absolute: 28 cells/uL (ref 0–200)
Basophils Relative: 0.4 %
Eosinophils Absolute: 255 cells/uL (ref 15–500)
Eosinophils Relative: 3.7 %
HCT: 31.7 % — ABNORMAL LOW (ref 35.0–45.0)
Hemoglobin: 10.2 g/dL — ABNORMAL LOW (ref 11.7–15.5)
Lymphs Abs: 669 cells/uL — ABNORMAL LOW (ref 850–3900)
MCH: 27.2 pg (ref 27.0–33.0)
MCHC: 32.2 g/dL (ref 32.0–36.0)
MCV: 84.5 fL (ref 80.0–100.0)
MPV: 8.9 fL (ref 7.5–12.5)
Monocytes Relative: 7.5 %
Neutro Abs: 5430 cells/uL (ref 1500–7800)
Neutrophils Relative %: 78.7 %
Platelets: 354 10*3/uL (ref 140–400)
RBC: 3.75 10*6/uL — ABNORMAL LOW (ref 3.80–5.10)
RDW: 20.4 % — ABNORMAL HIGH (ref 11.0–15.0)
Total Lymphocyte: 9.7 %
WBC: 6.9 10*3/uL (ref 3.8–10.8)

## 2020-03-05 ENCOUNTER — Other Ambulatory Visit: Payer: Self-pay

## 2020-03-05 ENCOUNTER — Encounter: Payer: Self-pay | Admitting: Emergency Medicine

## 2020-03-05 ENCOUNTER — Other Ambulatory Visit: Payer: Self-pay | Admitting: Oncology

## 2020-03-05 ENCOUNTER — Ambulatory Visit (INDEPENDENT_AMBULATORY_CARE_PROVIDER_SITE_OTHER): Payer: Medicare Other | Admitting: Emergency Medicine

## 2020-03-05 DIAGNOSIS — A310 Pulmonary mycobacterial infection: Secondary | ICD-10-CM

## 2020-03-05 DIAGNOSIS — E871 Hypo-osmolality and hyponatremia: Secondary | ICD-10-CM

## 2020-03-05 DIAGNOSIS — Z170 Estrogen receptor positive status [ER+]: Secondary | ICD-10-CM

## 2020-03-05 NOTE — Progress Notes (Signed)
Received request from Dr. Renold Genta for Anna Valentine to be assessed and treated for iron deficiency.  We are checking her iron next week and setting her up for 2 Feraheme doses.  She has follow-up with Dr. Renold Genta in late September.

## 2020-03-05 NOTE — Progress Notes (Signed)
Subjective:    Patient ID: Anna Valentine, female    DOB: 02/29/1951, 69 y.o.   MRN: [id number]  HPI  Mycobacterium avium sensitive to clarithromycin, amikacin, moxifloxacin, linezolid. Pseudomonas pansensitive  ROV 12/20/19 --Anna Valentine is 69 and has a history of right sided non-small cell lung cancer status post resection.  Also with bronchiectasis, known Mycobacterium avium, colonized with Pseudomonas, based on bronchoscopy 1 year ago (complicated by pneumothorax).  She had been clinically stable with only waxing and waning nodular disease by CT so we deferred treatment of the [facility].  Since last visit however she has been treated multiple times for exacerbations/pneumonias including in mid May and now again at the beginning of this month.  Is on levofloxacin now.  Based on the recurrent symptoms her CT chest was repeated 12/18/2019 and I have reviewed.  This shows enlargement with thick-walled cavitary lesion in the posterior right upper lobe compared with priors, a new dense area of consolidation extending from the right middle lobe bronchus and right hilum into the lateral segment of the right middle lobe, increased clustered nodularity throughout the right lung.  She restarted the levaquin on , reports that she is a bit better >. No fevers , less cough. She still has R back pain, fatigue.   ROV 01/03/20 --follow-up visit for Anna Valentine, 69 year old woman with a history of right-sided non-small cell lung cancer post resection, bronchiectasis with known Mycobacterium avium and Pseudomonas.  She has been sick for over a month with symptoms consistent with pneumonia, treated with levofloxacin.  There is an enlarging area of thick-walled cavitation and dense consolidation on CT scan, difficult to interpret, could be consistent with infection/abscess but certainly concerning for malignancy. Her fevers, chills, cough have not resolved. The mucous production is less. She is completing Augmentin now.   PET  scan was done 12/31/2019 and I have reviewed.  This shows intense hypermetabolism in the cavitary lesion as well as the right lung parenchyma.  Is also a focus of hypermetabolic peripheral pleural thickening and some scattered other nodule hypermetabolic areas.  ROV 01/22/20 -follow-up visit for 69 year old woman with history of right-sided non-small cell lung cancer, significant bronchiectasis with known Mycobacterium avium.  She had PET positive progressive right lung disease which prompted bronchoscopy which was done on 01/08/2020.  All of her cytologies have shown granulomatous inflammation without any evidence of malignancy.  Her AFB is positive for MAIC, sensitivities pending.  She continues to feel very poorly with significant dyspnea on exertion, fatigue, chills and fever.  She is currently off antibiotics.  ROV 02/20/20 --this been a very difficult interval for Anna Valentine, 69 year old woman with a history of breast cancer on tamoxifen, right-sided non-small cell lung cancer post resection (remote), significant bronchiectasis and structural abnormality with evolving pneumonia/cavitary lesions.  Bronchoscopy revealed no malignancy but MAIC.  She was started on azithromycin, ethambutol, rifampin.   She has had sweats, weakness, fatigue and profound nausea with poor p.o. intake.  She was in the emergency department 8/9 due to all the symptoms, found to be dehydrated with a sodium 126.  Chest x-ray did not reveal any new abnormalities. She is having progressive dyspnea over the last 2 weeks. Having back pain as well. Significant nausea - unable to take much PO at all.   MAIC sensitivity data (01/08/2020): Amikacin susceptible, clarithromycin susceptible, linezolid, streptomycin and moxifloxacin resistant.   ROV 03/05/20 --Anna Valentine 69 and was admitted after our last visit 8/12 when she presented with sweats, weakness,  fatigue, found nausea and poor p.o. intake.  Her lab work showed severe hyponatremia.  She  has recently been started on azithromycin, ethambutol, rifampin for her significant MAIC affecting the entire right lung.  This had to be stopped in the hospital due to the symptoms described above.  With IV fluids her sodium improved to 134.  It was 133 on 8/24.  Today she describes significant improvement in her energy, nausea.    Review of Systems  Constitutional: Positive for activity change and fatigue. Negative for fever and unexpected weight change.  HENT: Negative.  Negative for congestion, dental problem, ear pain, nosebleeds, postnasal drip, rhinorrhea, sinus pressure, sneezing, sore throat and trouble swallowing.   Eyes: Negative.  Negative for redness and itching.  Respiratory: Negative.  Negative for cough, chest tightness, shortness of breath and wheezing.   Cardiovascular: Negative.  Negative for palpitations and leg swelling.  Gastrointestinal: Negative.  Negative for nausea and vomiting.  Genitourinary: Negative.  Negative for dysuria.  Musculoskeletal: Positive for back pain. Negative for joint swelling.  Skin: Negative.  Negative for rash.  Neurological: Negative.  Negative for headaches.  Hematological: Negative.  Does not bruise/bleed easily.  Psychiatric/Behavioral: Negative.  Negative for dysphoric mood. The patient is not nervous/anxious.         Objective:   Physical Exam Vitals:   03/05/20 1417  BP: 108/68  Pulse: (!) 103  Temp: (!) 97.3 F (36.3 C)  TempSrc: Oral  SpO2: 99%  Weight: 95 lb 12.8 oz (43.5 kg)  Height: 5\' 6"  (1.676 m)   Gen: tired, ill appearing, thin woman, well-nourished, in no distress  ENT: No lesions,  mouth clear,  oropharynx clear, no postnasal drip  Neck: No JVD, no stridor  Lungs: No use of accessory muscles, coarse R lung with some inspiratory and expiratory rhonchi  Cardiovascular: RRR, heart sounds normal, no murmur or gallops, no peripheral edema  Musculoskeletal: No deformities, no cyanosis or clubbing  Neuro: alert,  non focal  Skin: Warm, no lesions or rash     Assessment & Plan:  Hyponatremia Improved with IV fluids and with discontinuation of her 3 drug regimen. Her nausea is better, energy is better, p.o. intake is better. Stable on last labs at 133. She will probably need intermittent repeat BMP  Mycobacterium avium infection ([place]) With extensive right lung disease. Her CT scan from 02/20/2020 showed that one of her areas of consolidation now has a more cavitary appearance, is smaller. She will need to be back on an [facility] regimen but will have to talk to Dr. Megan Salon about what she will be able to tolerate.  I am glad that you are improved since your antibiotics were held. Your CT scan of the chest done during hospitalization showed some possible improvement in some of the affected areas in your right lung. There is also some residual cavitation present. Follow-up with Dr. Megan Salon as planned so that we can adjust the antibiotic regimen to something that you can tolerate. You will probably need labs checked every several weeks to ensure that your sodium is not changing We will discuss the timing of any follow-up CT scan of the chest depending on your progress on antibiotic treatment. Follow with Dr. Lamonte Sakai in 2 months or sooner if you have any problems.  Baltazar Apo, MD, PhD 03/05/2020, 2:48 PM [facility] [phone] or if no answer [phone]

## 2020-03-05 NOTE — Assessment & Plan Note (Signed)
Improved with IV fluids and with discontinuation of her 3 drug regimen. Her nausea is better, energy is better, p.o. intake is better. Stable on last labs at 133. She will probably need intermittent repeat BMP

## 2020-03-05 NOTE — Patient Instructions (Signed)
I am glad that you are improved since your antibiotics were held. Your CT scan of the chest done during hospitalization showed some possible improvement in some of the affected areas in your right lung. There is also some residual cavitation present. Follow-up with Dr. Megan Salon as planned so that we can adjust the antibiotic regimen to something that you can tolerate. You will probably need labs checked every several weeks to ensure that your sodium is not changing We will discuss the timing of any follow-up CT scan of the chest depending on your progress on antibiotic treatment. Follow with Dr. Lamonte Sakai in 2 months or sooner if you have any problems.

## 2020-03-05 NOTE — Assessment & Plan Note (Signed)
With extensive right lung disease. Her CT scan from 02/20/2020 showed that one of her areas of consolidation now has a more cavitary appearance, is smaller. She will need to be back on an [facility] regimen but will have to talk to Dr. Megan Salon about what she will be able to tolerate.  I am glad that you are improved since your antibiotics were held. Your CT scan of the chest done during hospitalization showed some possible improvement in some of the affected areas in your right lung. There is also some residual cavitation present. Follow-up with Dr. Megan Salon as planned so that we can adjust the antibiotic regimen to something that you can tolerate. You will probably need labs checked every several weeks to ensure that your sodium is not changing We will discuss the timing of any follow-up CT scan of the chest depending on your progress on antibiotic treatment. Follow with Dr. Lamonte Sakai in 2 months or sooner if you have any problems.

## 2020-03-06 DIAGNOSIS — J439 Emphysema, unspecified: Secondary | ICD-10-CM | POA: Diagnosis not present

## 2020-03-06 DIAGNOSIS — A312 Disseminated mycobacterium avium-intracellulare complex (DMAC): Secondary | ICD-10-CM | POA: Diagnosis not present

## 2020-03-06 DIAGNOSIS — G9341 Metabolic encephalopathy: Secondary | ICD-10-CM | POA: Diagnosis not present

## 2020-03-06 DIAGNOSIS — J479 Bronchiectasis, uncomplicated: Secondary | ICD-10-CM | POA: Diagnosis not present

## 2020-03-06 DIAGNOSIS — E860 Dehydration: Secondary | ICD-10-CM | POA: Diagnosis not present

## 2020-03-06 DIAGNOSIS — E871 Hypo-osmolality and hyponatremia: Secondary | ICD-10-CM | POA: Diagnosis not present

## 2020-03-09 ENCOUNTER — Other Ambulatory Visit: Payer: Self-pay

## 2020-03-09 ENCOUNTER — Inpatient Hospital Stay: Payer: Medicare Other

## 2020-03-09 ENCOUNTER — Encounter: Payer: Self-pay | Admitting: Internal Medicine

## 2020-03-09 ENCOUNTER — Ambulatory Visit (INDEPENDENT_AMBULATORY_CARE_PROVIDER_SITE_OTHER): Payer: Medicare Other | Admitting: Internal Medicine

## 2020-03-09 DIAGNOSIS — C50411 Malignant neoplasm of upper-outer quadrant of right female breast: Secondary | ICD-10-CM | POA: Diagnosis not present

## 2020-03-09 DIAGNOSIS — R634 Abnormal weight loss: Secondary | ICD-10-CM | POA: Diagnosis not present

## 2020-03-09 DIAGNOSIS — Z806 Family history of leukemia: Secondary | ICD-10-CM | POA: Diagnosis not present

## 2020-03-09 DIAGNOSIS — R112 Nausea with vomiting, unspecified: Secondary | ICD-10-CM | POA: Diagnosis not present

## 2020-03-09 DIAGNOSIS — Z170 Estrogen receptor positive status [ER+]: Secondary | ICD-10-CM | POA: Diagnosis not present

## 2020-03-09 DIAGNOSIS — Z8042 Family history of malignant neoplasm of prostate: Secondary | ICD-10-CM | POA: Diagnosis not present

## 2020-03-09 DIAGNOSIS — Z85118 Personal history of other malignant neoplasm of bronchus and lung: Secondary | ICD-10-CM | POA: Diagnosis not present

## 2020-03-09 DIAGNOSIS — Z8371 Family history of colonic polyps: Secondary | ICD-10-CM | POA: Diagnosis not present

## 2020-03-09 DIAGNOSIS — Z836 Family history of other diseases of the respiratory system: Secondary | ICD-10-CM | POA: Diagnosis not present

## 2020-03-09 DIAGNOSIS — A310 Pulmonary mycobacterial infection: Secondary | ICD-10-CM | POA: Diagnosis not present

## 2020-03-09 DIAGNOSIS — Z800 Family history of malignant neoplasm of digestive organs: Secondary | ICD-10-CM | POA: Diagnosis not present

## 2020-03-09 DIAGNOSIS — I509 Heart failure, unspecified: Secondary | ICD-10-CM | POA: Diagnosis not present

## 2020-03-09 DIAGNOSIS — Z7289 Other problems related to lifestyle: Secondary | ICD-10-CM | POA: Diagnosis not present

## 2020-03-09 DIAGNOSIS — C3431 Malignant neoplasm of lower lobe, right bronchus or lung: Secondary | ICD-10-CM

## 2020-03-09 DIAGNOSIS — Z853 Personal history of malignant neoplasm of breast: Secondary | ICD-10-CM | POA: Diagnosis not present

## 2020-03-09 DIAGNOSIS — Z87891 Personal history of nicotine dependence: Secondary | ICD-10-CM | POA: Diagnosis not present

## 2020-03-09 DIAGNOSIS — Z801 Family history of malignant neoplasm of trachea, bronchus and lung: Secondary | ICD-10-CM | POA: Diagnosis not present

## 2020-03-09 DIAGNOSIS — Z803 Family history of malignant neoplasm of breast: Secondary | ICD-10-CM | POA: Diagnosis not present

## 2020-03-09 DIAGNOSIS — Z79899 Other long term (current) drug therapy: Secondary | ICD-10-CM | POA: Diagnosis not present

## 2020-03-09 LAB — CMP (CANCER CENTER ONLY)
ALT: 59 U/L — ABNORMAL HIGH (ref 0–44)
AST: 46 U/L — ABNORMAL HIGH (ref 15–41)
Albumin: 2.9 g/dL — ABNORMAL LOW (ref 3.5–5.0)
Alkaline Phosphatase: 112 U/L (ref 38–126)
Anion gap: 9 (ref 5–15)
BUN: 16 mg/dL (ref 8–23)
CO2: 26 mmol/L (ref 22–32)
Calcium: 10 mg/dL (ref 8.9–10.3)
Chloride: 101 mmol/L (ref 98–111)
Creatinine: 0.69 mg/dL (ref 0.44–1.00)
GFR, Est AFR Am: >60 mL/min (ref >60)
GFR, Estimated: >60 mL/min (ref >60)
Glucose, Bld: 100 mg/dL — ABNORMAL HIGH (ref 70–99)
Potassium: 4 mmol/L (ref 3.5–5.1)
Sodium: 136 mmol/L (ref 135–145)
Total Bilirubin: 0.4 mg/dL (ref 0.3–1.2)
Total Protein: 7.3 g/dL (ref 6.5–8.1)

## 2020-03-09 LAB — CBC WITH DIFFERENTIAL (CANCER CENTER ONLY)
Abs Immature Granulocytes: 0.08 10*3/uL — ABNORMAL HIGH (ref 0.00–0.07)
Basophils Absolute: 0 10*3/uL (ref 0.0–0.1)
Basophils Relative: 0 %
Eosinophils Absolute: 0.1 10*3/uL (ref 0.0–0.5)
Eosinophils Relative: 2 %
HCT: 31 % — ABNORMAL LOW (ref 36.0–46.0)
Hemoglobin: 9.6 g/dL — ABNORMAL LOW (ref 12.0–15.0)
Immature Granulocytes: 1 %
Lymphocytes Relative: 5 %
Lymphs Abs: 0.5 10*3/uL — ABNORMAL LOW (ref 0.7–4.0)
MCH: 27 pg (ref 26.0–34.0)
MCHC: 31 g/dL (ref 30.0–36.0)
MCV: 87.3 fL (ref 80.0–100.0)
Monocytes Absolute: 0.7 10*3/uL (ref 0.1–1.0)
Monocytes Relative: 8 %
Neutro Abs: 7.5 10*3/uL (ref 1.7–7.7)
Neutrophils Relative %: 84 %
Platelet Count: 317 10*3/uL (ref 150–400)
RBC: 3.55 MIL/uL — ABNORMAL LOW (ref 3.87–5.11)
RDW: 21 % — ABNORMAL HIGH (ref 11.5–15.5)
WBC Count: 8.9 10*3/uL (ref 4.0–10.5)
nRBC: 0 % (ref 0.0–0.2)

## 2020-03-09 LAB — IRON AND TIBC
Iron: 28 ug/dL — ABNORMAL LOW (ref 41–142)
Saturation Ratios: 13 % — ABNORMAL LOW (ref 21–57)
TIBC: 214 ug/dL — ABNORMAL LOW (ref 236–444)
UIBC: 185 ug/dL (ref 120–384)

## 2020-03-09 LAB — RETIC PANEL
Immature Retic Fract: 8.1 % (ref 2.3–15.9)
RBC.: 3.52 MIL/uL — ABNORMAL LOW (ref 3.87–5.11)
Retic Count, Absolute: 54.2 10*3/uL (ref 19.0–186.0)
Retic Ct Pct: 1.5 % (ref 0.4–3.1)
Reticulocyte Hemoglobin: 33.9 pg (ref >27.9)

## 2020-03-09 LAB — FERRITIN: Ferritin: 1585 ng/mL — ABNORMAL HIGH (ref 11–307)

## 2020-03-09 NOTE — Progress Notes (Signed)
[facility]  Patient Active Problem List   Diagnosis Date Noted  . Mycobacterium avium infection ([place]) 02/11/2020    Priority: High  . Intractable nausea and vomiting 02/20/2020    Priority: Medium  . Weight loss, non-intentional 09/20/2019    Priority: Medium  . Skin rash 02/28/2020  . Hyponatremia   . Dehydration with hyponatremia 02/20/2020  . Low back pain 02/20/2020  . Chronic diastolic CHF (congestive heart failure) ([place]) 02/20/2020  . Essential hypertension 02/20/2020  . Acute metabolic encephalopathy 72/53/6644  . Right epiretinal membrane 01/30/2020  . 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 Nuclear sclerotic cataract of right eye 01/30/2020  . Nuclear sclerotic cataract of left eye 01/30/2020  . Iron deficiency anemia 01/20/2020  . Malignant neoplasm of overlapping sites of right breast in female, estrogen receptor positive ([place]) 09/20/2019  . Postprocedural pneumothorax   . 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 Port-A-Cath in place 04/26/2018  . Genetic testing 02/20/2018  . Family history of breast cancer   . Family history of lung cancer   . Personal history of lung cancer   . Chest pain 11/28/2017  . Abnormal CT of the chest 11/30/2016  . Malignant neoplasm of upper-inner quadrant of left breast in female, estrogen receptor positive ([place]) 06/16/2014  . Osteopenia 09/13/2013  . Postmenopausal atrophic vaginitis 09/13/2013  . Vitamin D deficiency 09/13/2013  . Malignant neoplasm of lower lobe of right lung ([place]) 07/24/2012  . History of lung cancer 03/11/2011  . Anxiety 03/11/2011  . History of neutropenia 03/11/2011  . Family history of colon cancer 03/11/2011  . Cough 11/15/2010  . Bronchiectasis without complication ([place]) 03/47/4259    Patient's Medications  New Prescriptions   No medications on file  Previous Medications   ACYCLOVIR (ZOVIRAX) 400 MG TABLET    Take 1 tablet (400 mg total) by mouth 2 (two) times daily.    ALBUTEROL (VENTOLIN HFA) 108 (90 BASE) MCG/ACT INHALER    Inhale 2 puffs into the lungs every 6 (six) hours as needed for wheezing or shortness of breath.   CARVEDILOL (COREG) 6.25 MG TABLET    Take 6.25 mg by mouth 2 (two) times daily.    FOLIC ACID (FOLVITE) 1 MG TABLET    Take 1 tablet (1 mg total) by mouth daily.   GABAPENTIN (NEURONTIN) 300 MG CAPSULE    Take 1 capsule (300 mg total) by mouth at bedtime.   MIRTAZAPINE (REMERON) 7.5 MG TABLET    Take 1 tablet (7.5 mg total) by mouth at bedtime.   MULTIPLE VITAMINS-MINERALS (MULTIVITAMIN WITH MINERALS) TABLET    Take 1 tablet by mouth daily.    ONDANSETRON (ZOFRAN) 4 MG TABLET    Take 2 tablets (8 mg total) by mouth every 6 (six) hours as needed for nausea or vomiting.   PANTOPRAZOLE (PROTONIX) 40 MG TABLET    Take 1 tablet (40 mg total) by mouth daily.   PROCHLORPERAZINE (COMPAZINE) 5 MG TABLET    Take 2 tablets (10 mg total) by mouth every 6 (six) hours as needed for nausea or vomiting.   PSYLLIUM (METAMUCIL PO)    Take 1 Scoop by mouth daily as needed (constipation).    SODIUM CHLORIDE HYPERTONIC 3 % NEBULIZER SOLUTION    SMARTSIG:Via Nebulizer Morning-Night   TAMOXIFEN (NOLVADEX) 20 MG TABLET    Take 20 mg by mouth at bedtime.    VITAMIN B-12 (CYANOCOBALAMIN) 500 MCG TABLET    Take 500 mcg by mouth  daily.   Modified Medications   No medications on file  Discontinued Medications   No medications on file    Subjective: Anna Valentine is in for her hospital follow-up visit.  She has a history of bronchiectasis and cavitary lung disease.  She also has a remote history of lung cancer and underwent right lower lobectomy.  She is currently being treated for breast cancer.  She had an abnormal PET scan last year and underwent bronchoscopy last July.  Cultures were positive for Mycobacterium avium.  She was not having fever, cough or shortness of breath so she was not treated at that time.  Her husband died in 2022/11/17 and shortly afterward and she began to  develop fever, chills, sweats, productive cough and shortness of breath.  A CT scan was obtained on 12/18/2019 which showed a new mass in the right hilum, increased thickening of the wall of the cavitary lesion in the right upper lobe and new extensive nodularity throughout the right lung.  A PET scan was obtained on 12/31/2019 which showed increased metabolic activity in the right lung.  She underwent bronchoscopy on 01/08/2020.  Pathology review showed granulomatous and acute inflammation.  There was no evidence of malignancy.  AFB cultures grew Mycobacterium avium again.    Her isolate was sensitive to macrolide antibiotics.  She was started on 3 times weekly azithromycin, ethambutol and rifampin on 01/22/2020.  She recalls having some nausea after starting her antibiotics.  I saw her in early August and changed her to daily dosing.   She had been having some unintentional weight loss since this past January her nausea got worse and she started losing more weight.  She also had some abdominal and back pain and started having fever, chills and sweats leading to admission to the hospital on 02/20/2020.  Her CT scanning showed that the masslike lesion in the right upper lobe now had more cystic changes.  She was seen by my partners who recommended holding her 3 antibiotics.  She is now feeling dramatically better.  She says that she feels better than she has since 11/17/2022 of this year.  Vomiting, fever, chills and sweats resolved.  Diet has improved and she is gaining some weight.  She says that her cough is actually better.  She only rarely brings up some sputum.  She has had no hemoptysis.  She has not noted any worsening of her dyspnea on exertion.  Review of Systems: Review of Systems  Constitutional: Positive for malaise/fatigue. Negative for chills, diaphoresis, fever and weight loss.  HENT: Negative for sore throat.   Respiratory: Positive for cough, sputum production and shortness of breath.     Cardiovascular: Negative for chest pain.  Gastrointestinal: Negative for abdominal pain, diarrhea, heartburn, nausea and vomiting.  Genitourinary: Negative for dysuria and frequency.  Musculoskeletal: Negative for joint pain and myalgias.  Skin: Negative for rash.  Psychiatric/Behavioral: Negative for depression. The patient is not nervous/anxious.     Past Medical History:  Diagnosis Date  . Anemia   . BRCA negative 2011   BRCA I/ II negative  . Breast cancer ([place]) 08/2009   stage 2, rx with lumpectomy and xrt  . Bronchiectasis ([place])   . Chronic diastolic CHF (congestive heart failure) ([place]) 02/20/2020  . Constipation   . Essential hypertension 02/20/2020  . Family history of adverse reaction to anesthesia    My Sister has nausea  . Family history of breast cancer   . Family history of colon cancer   .  Family history of lung cancer   . GERD (gastroesophageal reflux disease)    not presently having symptoms  . Hypertension   . Iatrogenic pneumothorax 02/06/2019  . Lung cancer ([place]) 06/06/05   stage 1 poorly differentiated adenocarcinoma, s/p right lower lobectomy.  . Osteopenia 09/13/2013  . Osteoporosis   . Personal history of lung cancer   . Pneumonia    2007ish, walking pnemonia - 2009 ish  . STD (sexually transmitted disease)    HSV  . Vitamin D deficiency 09/13/2013    Social History   Tobacco Use  . Smoking status: Former Smoker    Packs/day: 2.00    Years: 18.00    Pack years: 36.00    Quit date: 07/12/1987    Years since quitting: 32.6  . Smokeless tobacco: Never Used  Vaping Use  . Vaping Use: Never used  Substance Use Topics  . Alcohol use: Yes    Alcohol/week: 14.0 standard drinks    Types: 7 Glasses of wine, 7 Shots of liquor per week    Comment: social  . Drug use: No    Family History  Problem Relation Age of Onset  . Allergies Mother   . Asthma Mother   . Lung cancer Mother   . Breast cancer Mother 79       recurrence age 14  . Colon cancer  Father 59  . Prostate cancer Brother 2  . Breast cancer Sister 36       Recurrence age 8 BRCA negative  . Colon polyps Sister   . Leukemia Sister   . Breast cancer Sister 70  . Prostate cancer Brother 38  . Breast cancer Maternal Grandmother 6  . Colon cancer Maternal Aunt   . Leukemia Maternal Grandfather   . Lung cancer Maternal Aunt   . Breast cancer Cousin   . Esophageal cancer Neg Hx   . Rectal cancer Neg Hx   . Stomach cancer Neg Hx     Allergies  Allergen Reactions  . Taxol [Paclitaxel] Anaphylaxis  . Tape Itching  . Abraxane [Paclitaxel Protein-Bound Part] Rash  . Clarithromycin Rash    Objective: Vitals:   03/09/20 1442  BP: 117/68  Pulse: 94  SpO2: 100%  Weight: 99 lb (44.9 kg)   Body mass index is 15.98 kg/m.  Physical Exam Constitutional:      Comments: She is in good spirits.  She is accompanied by her friend, Helene Kelp.  Cardiovascular:     Rate and Rhythm: Normal rate and regular rhythm.     Heart sounds: No murmur heard.   Pulmonary:     Effort: Pulmonary effort is normal.     Breath sounds: Rales present. No wheezing.     Comments: Right-sided crackles posteriorly. Abdominal:     Palpations: Abdomen is soft.     Tenderness: There is no abdominal tenderness.  Musculoskeletal:        General: No swelling or tenderness.  Skin:    Findings: No rash.  Neurological:     General: No focal deficit present.  Psychiatric:        Mood and Affect: Mood normal.     Lab Results    Problem List Items Addressed This Visit      High   Mycobacterium avium infection ([place])    Her recent hospitalization was probably triggered by side effects of her 3 drug regimen.  Since she is feeling so much better now we have decided to keep her off of antibiotic  therapy for the foreseeable future.  If she worsens I would recommend restarting antibiotics 1 at that time and then a lower dose to see if we can pinpoint which one was causing problems.  She will  follow-up in 6 weeks.        Medium   Weight loss, non-intentional    Her unintentional weight loss is multifactorial but was exacerbated by recent antibiotic therapy.  She is eating better now and has gained a little bit of weight.      Intractable nausea and vomiting    Her nausea and vomiting resolved promptly after stopping her antibiotics.          Michel Bickers, MD [facility] [phone] pager   [phone] cell 03/09/2020, 3:55 PM

## 2020-03-09 NOTE — Assessment & Plan Note (Signed)
Her unintentional weight loss is multifactorial but was exacerbated by recent antibiotic therapy.  She is eating better now and has gained a little bit of weight.

## 2020-03-09 NOTE — Assessment & Plan Note (Signed)
Her nausea and vomiting resolved promptly after stopping her antibiotics.

## 2020-03-09 NOTE — Assessment & Plan Note (Signed)
Her recent hospitalization was probably triggered by side effects of her 3 drug regimen.  Since she is feeling so much better now we have decided to keep her off of antibiotic therapy for the foreseeable future.  If she worsens I would recommend restarting antibiotics 1 at that time and then a lower dose to see if we can pinpoint which one was causing problems.  She will follow-up in 6 weeks.

## 2020-03-10 NOTE — Progress Notes (Signed)
   Subjective:    Patient ID: Anna Valentine, female    DOB: [birth year], 69 y.o.   MRN: [id number]  HPI 69 year old Female admitted to hospital August 12-16 with generalized weakness, nausea, and sodium of 119 likely due to poor oral intake and volume depletion.  She was treated with IV fluids and sodium improved to 134 at the time of discharge.  She has a history of Mycobacterium avium complex diagnosed in June 2020 and followed by Dr. [name].  She has a history of bilateral placed mastectomies, adenocarcinoma of the right lung status post right lower lobe lobectomy, bronchiectasis, hypertension, diastolic heart failure and anxiety.  She lost her husband earlier this year and resides alone.  She has a Insurance risk surveyor, Anna Valentine [phone] who lives in [place] and a brother who lives in [place].  Her good friend Anna Valentine has been helping her.  Patient is also followed closely by Dr. Jana Valentine.  Has received iron infusions for iron deficiency due to poor p.o. intake.  Receiving iron infusion today.  Hospitalist advised that we will repeat CBC and c-Met today.  Hemoglobin is 10.2 g with MCV of 84.5 and improved from August 14 when it was 8.7.  Serum iron in early July was 15.  On August 12 serum iron was 34.  Her BUN and creatinine are normal today and chloride is 97.  Potassium 4.7.  She has slight elevations of liver functions SGOT 37 SGPT 41.  Her affect is much brighter and she seems a bit less depressed.  She is complaining of some lack of energy and we decided to give her a B12 injection today 1 cc IM to see if that would help her energy level.  I see no harm in that.  Review of Systems see above-no new complaints     Objective:   Physical Exam Blood pressure 110/80 pulse 90 temperature 99.4 degrees pulse oximetry 97% weight 95 pounds BMI 15.33 Neck is supple.  Chest clear to auscultation.  Cardiac exam regular rate and rhythm normal S1 and S2 without murmurs or gallops.   No lower extremity edema.  Affect and judgment appear to be normal.  She looks a bit stronger.      Assessment & Plan:  She remains underweight.  However she looks much better since her hospitalization.  Hyponatremia-resolved with IV fluids and likely due to volume depletion.  Will need to be monitored.  History of iron deficiency-received IV iron infusion today  History of Mycobacterium AVM infection of the lung  History of lung cancer  History of breast cancer  Plan: She will continue close follow-up with Dr. [name] and I will see her again in September 21

## 2020-03-10 NOTE — Patient Instructions (Signed)
Hyponatremia has improved.  B12 1 cc IM given in office to help with energy level.  Continue with iron infusions per Dr. Jana Hakim.  Continue follow-up with Dr. Lamonte Sakai.  Return here September 21.

## 2020-03-11 ENCOUNTER — Telehealth: Payer: Self-pay | Admitting: Internal Medicine

## 2020-03-11 DIAGNOSIS — E871 Hypo-osmolality and hyponatremia: Secondary | ICD-10-CM | POA: Diagnosis not present

## 2020-03-11 DIAGNOSIS — R531 Weakness: Secondary | ICD-10-CM | POA: Diagnosis not present

## 2020-03-11 DIAGNOSIS — R627 Adult failure to thrive: Secondary | ICD-10-CM | POA: Diagnosis not present

## 2020-03-11 NOTE — Telephone Encounter (Signed)
Faxed signed Certified [place] Orders to [facility] [phone], phone number [phone] Order number [id number] Certification period 02/27/2020 to 04/26/2020

## 2020-03-12 ENCOUNTER — Inpatient Hospital Stay (HOSPITAL_BASED_OUTPATIENT_CLINIC_OR_DEPARTMENT_OTHER): Payer: Medicare Other | Admitting: Medical

## 2020-03-12 ENCOUNTER — Other Ambulatory Visit: Payer: Self-pay

## 2020-03-12 ENCOUNTER — Inpatient Hospital Stay: Payer: Medicare Other | Attending: Oncology

## 2020-03-12 VITALS — BP 112/60 | HR 88 | Temp 99.2°F | Resp 16

## 2020-03-12 DIAGNOSIS — Z85118 Personal history of other malignant neoplasm of bronchus and lung: Secondary | ICD-10-CM | POA: Diagnosis not present

## 2020-03-12 DIAGNOSIS — M25511 Pain in right shoulder: Secondary | ICD-10-CM | POA: Diagnosis not present

## 2020-03-12 DIAGNOSIS — C50411 Malignant neoplasm of upper-outer quadrant of right female breast: Secondary | ICD-10-CM | POA: Insufficient documentation

## 2020-03-12 DIAGNOSIS — I10 Essential (primary) hypertension: Secondary | ICD-10-CM | POA: Insufficient documentation

## 2020-03-12 DIAGNOSIS — A312 Disseminated mycobacterium avium-intracellulare complex (DMAC): Secondary | ICD-10-CM | POA: Diagnosis not present

## 2020-03-12 DIAGNOSIS — Z79899 Other long term (current) drug therapy: Secondary | ICD-10-CM | POA: Diagnosis not present

## 2020-03-12 DIAGNOSIS — R0789 Other chest pain: Secondary | ICD-10-CM | POA: Insufficient documentation

## 2020-03-12 DIAGNOSIS — E860 Dehydration: Secondary | ICD-10-CM | POA: Diagnosis not present

## 2020-03-12 DIAGNOSIS — G9341 Metabolic encephalopathy: Secondary | ICD-10-CM | POA: Diagnosis not present

## 2020-03-12 DIAGNOSIS — Z8042 Family history of malignant neoplasm of prostate: Secondary | ICD-10-CM | POA: Insufficient documentation

## 2020-03-12 DIAGNOSIS — Z7289 Other problems related to lifestyle: Secondary | ICD-10-CM | POA: Diagnosis not present

## 2020-03-12 DIAGNOSIS — Z800 Family history of malignant neoplasm of digestive organs: Secondary | ICD-10-CM | POA: Insufficient documentation

## 2020-03-12 DIAGNOSIS — J439 Emphysema, unspecified: Secondary | ICD-10-CM | POA: Diagnosis not present

## 2020-03-12 DIAGNOSIS — R0602 Shortness of breath: Secondary | ICD-10-CM | POA: Insufficient documentation

## 2020-03-12 DIAGNOSIS — Z801 Family history of malignant neoplasm of trachea, bronchus and lung: Secondary | ICD-10-CM | POA: Diagnosis not present

## 2020-03-12 DIAGNOSIS — T451X5A Adverse effect of antineoplastic and immunosuppressive drugs, initial encounter: Secondary | ICD-10-CM | POA: Diagnosis not present

## 2020-03-12 DIAGNOSIS — Z87891 Personal history of nicotine dependence: Secondary | ICD-10-CM | POA: Insufficient documentation

## 2020-03-12 DIAGNOSIS — E871 Hypo-osmolality and hyponatremia: Secondary | ICD-10-CM | POA: Diagnosis not present

## 2020-03-12 DIAGNOSIS — Z806 Family history of leukemia: Secondary | ICD-10-CM | POA: Insufficient documentation

## 2020-03-12 DIAGNOSIS — Z170 Estrogen receptor positive status [ER+]: Secondary | ICD-10-CM | POA: Diagnosis not present

## 2020-03-12 DIAGNOSIS — T8090XA Unspecified complication following infusion and therapeutic injection, initial encounter: Secondary | ICD-10-CM | POA: Diagnosis not present

## 2020-03-12 DIAGNOSIS — Z803 Family history of malignant neoplasm of breast: Secondary | ICD-10-CM | POA: Diagnosis not present

## 2020-03-12 DIAGNOSIS — Z836 Family history of other diseases of the respiratory system: Secondary | ICD-10-CM | POA: Diagnosis not present

## 2020-03-12 DIAGNOSIS — C50212 Malignant neoplasm of upper-inner quadrant of left female breast: Secondary | ICD-10-CM

## 2020-03-12 DIAGNOSIS — Z95828 Presence of other vascular implants and grafts: Secondary | ICD-10-CM

## 2020-03-12 DIAGNOSIS — J479 Bronchiectasis, uncomplicated: Secondary | ICD-10-CM | POA: Diagnosis not present

## 2020-03-12 MED ORDER — SODIUM CHLORIDE 0.9 % IV SOLN
Freq: Once | INTRAVENOUS | Status: AC
  Filled 2020-03-12: qty 250

## 2020-03-12 MED ORDER — SODIUM CHLORIDE 0.9 % IV SOLN
510.0000 mg | Freq: Once | INTRAVENOUS | Status: AC
  Administered 2020-03-12: 510 mg via INTRAVENOUS
  Filled 2020-03-12: qty 510

## 2020-03-12 MED ORDER — FAMOTIDINE IN NACL 20-0.9 MG/50ML-% IV SOLN
20.0000 mg | Freq: Once | INTRAVENOUS | Status: AC
  Administered 2020-03-12: 20 mg via INTRAVENOUS

## 2020-03-12 NOTE — Progress Notes (Signed)
Pt. had an allergic reaction to Iron. Complained of Right shoulder pain, radiating down the right arm and in the chest, an aching type pain. States she feels like she is having difficulty breathing. Iron stopped, emergency protocol initiated and Sandi Mealy, PA here for observation and assessment. Pt. O 2 at 2 liters/,  IV fluids and Pepcid given. Iron restarted. Pt. tolerated rest of Iron treatment without difficulty. Left via ambulation, no respiratory distress noted.

## 2020-03-12 NOTE — Patient Instructions (Signed)

## 2020-03-18 NOTE — Progress Notes (Signed)
    DATE:  03/12/2020                                          X  CHEMO/IMMUNOTHERAPY REACTION           MD:  Dr. Sarajane Jews Magrinat   AGENT/BLOOD PRODUCT RECEIVING TODAY:              Feraheme   AGENT/BLOOD PRODUCT RECEIVING IMMEDIATELY PRIOR TO REACTION:          Feraheme   VS: BP:     114/63   P:       94       SPO2:       100 on oxygen via [place] at 2 LPM               T: 98.5   REACTION(S):           right shoulder pain radiating down arm, chest tightness and shortness of breath   PREMEDS:     no premeds   INTERVENTION: Feraheme was paused and the patient was placed on supplemental oxygen at 2 LPM and was given Pepcid 20 mg IV x 1.   Review of Systems  Review of Systems  Constitutional: Negative for chills, diaphoresis and fever.  HENT: Negative for trouble swallowing and voice change.   Respiratory: Positive for chest tightness and shortness of breath. Negative for cough and wheezing.   Cardiovascular: Negative for chest pain and palpitations.  Gastrointestinal: Negative for abdominal pain, constipation, diarrhea, nausea and vomiting.  Musculoskeletal: Positive for arthralgias and myalgias. Negative for back pain.  Neurological: Negative for dizziness, light-headedness and headaches.     Physical Exam  Physical Exam Constitutional:      General: She is not in acute distress.    Appearance: She is not diaphoretic.  HENT:     Head: Normocephalic and atraumatic.  Eyes:     General: No scleral icterus.       Right eye: No discharge.        Left eye: No discharge.     Conjunctiva/sclera: Conjunctivae normal.  Cardiovascular:     Rate and Rhythm: Normal rate and regular rhythm.     Heart sounds: Normal heart sounds. No murmur heard.  No friction rub. No gallop.   Pulmonary:     Effort: Pulmonary effort is normal. No respiratory distress.     Breath sounds: Normal breath sounds. No wheezing or rales.  Skin:    General: Skin is warm and dry.     Findings: No erythema or  rash.  Neurological:     Mental Status: She is alert.     OUTCOME:                The patient's symptoms abated shortly after Feraheme was paused. She was given Pepcid 20 mg IV x 1 and was able to restart and complete Feraheme without additional issues of concern.   Sandi Mealy, MHS, PA-C

## 2020-03-19 ENCOUNTER — Other Ambulatory Visit: Payer: Self-pay

## 2020-03-19 ENCOUNTER — Inpatient Hospital Stay: Payer: Medicare Other

## 2020-03-19 VITALS — BP 124/77 | HR 74 | Temp 98.2°F | Resp 18

## 2020-03-19 DIAGNOSIS — R0789 Other chest pain: Secondary | ICD-10-CM | POA: Diagnosis not present

## 2020-03-19 DIAGNOSIS — T451X5A Adverse effect of antineoplastic and immunosuppressive drugs, initial encounter: Secondary | ICD-10-CM | POA: Diagnosis not present

## 2020-03-19 DIAGNOSIS — Z170 Estrogen receptor positive status [ER+]: Secondary | ICD-10-CM | POA: Diagnosis not present

## 2020-03-19 DIAGNOSIS — C50411 Malignant neoplasm of upper-outer quadrant of right female breast: Secondary | ICD-10-CM | POA: Diagnosis not present

## 2020-03-19 DIAGNOSIS — M25511 Pain in right shoulder: Secondary | ICD-10-CM | POA: Diagnosis not present

## 2020-03-19 DIAGNOSIS — Z95828 Presence of other vascular implants and grafts: Secondary | ICD-10-CM

## 2020-03-19 DIAGNOSIS — R0602 Shortness of breath: Secondary | ICD-10-CM | POA: Diagnosis not present

## 2020-03-19 DIAGNOSIS — C50212 Malignant neoplasm of upper-inner quadrant of left female breast: Secondary | ICD-10-CM

## 2020-03-19 MED ORDER — SODIUM CHLORIDE 0.9 % IV SOLN
510.0000 mg | Freq: Once | INTRAVENOUS | Status: AC
  Administered 2020-03-19: 510 mg via INTRAVENOUS
  Filled 2020-03-19: qty 510

## 2020-03-19 MED ORDER — FAMOTIDINE IN NACL 20-0.9 MG/50ML-% IV SOLN
20.0000 mg | Freq: Once | INTRAVENOUS | Status: AC
  Administered 2020-03-19: 20 mg via INTRAVENOUS

## 2020-03-19 MED ORDER — FAMOTIDINE IN NACL 20-0.9 MG/50ML-% IV SOLN
INTRAVENOUS | Status: AC
  Filled 2020-03-19: qty 50

## 2020-03-19 MED ORDER — SODIUM CHLORIDE 0.9 % IV SOLN
INTRAVENOUS | Status: DC
  Filled 2020-03-19: qty 250

## 2020-03-19 NOTE — Patient Instructions (Signed)

## 2020-03-26 ENCOUNTER — Other Ambulatory Visit: Payer: Self-pay | Admitting: Internal Medicine

## 2020-03-31 ENCOUNTER — Ambulatory Visit (INDEPENDENT_AMBULATORY_CARE_PROVIDER_SITE_OTHER): Payer: Medicare Other | Admitting: Internal Medicine

## 2020-03-31 ENCOUNTER — Ambulatory Visit
Admission: RE | Admit: 2020-03-31 | Discharge: 2020-03-31 | Disposition: A | Discharge status: Home / Self Care | Payer: Medicare Other | Source: Ambulatory Visit | Attending: Internal Medicine | Admitting: Internal Medicine

## 2020-03-31 ENCOUNTER — Encounter: Payer: Self-pay | Admitting: Internal Medicine

## 2020-03-31 ENCOUNTER — Other Ambulatory Visit: Payer: Self-pay

## 2020-03-31 VITALS — BP 110/70 | HR 88 | Temp 97.7°F | Ht 66.0 in | Wt 100.0 lb

## 2020-03-31 DIAGNOSIS — D508 Other iron deficiency anemias: Secondary | ICD-10-CM

## 2020-03-31 DIAGNOSIS — Z853 Personal history of malignant neoplasm of breast: Secondary | ICD-10-CM | POA: Diagnosis not present

## 2020-03-31 DIAGNOSIS — R54 Age-related physical debility: Secondary | ICD-10-CM

## 2020-03-31 DIAGNOSIS — Z8619 Personal history of other infectious and parasitic diseases: Secondary | ICD-10-CM

## 2020-03-31 DIAGNOSIS — Z85118 Personal history of other malignant neoplasm of bronchus and lung: Secondary | ICD-10-CM

## 2020-03-31 DIAGNOSIS — M545 Low back pain, unspecified: Secondary | ICD-10-CM

## 2020-03-31 DIAGNOSIS — Z0000 Encounter for general adult medical examination without abnormal findings: Secondary | ICD-10-CM | POA: Diagnosis not present

## 2020-03-31 DIAGNOSIS — M47817 Spondylosis without myelopathy or radiculopathy, lumbosacral region: Secondary | ICD-10-CM | POA: Diagnosis not present

## 2020-03-31 NOTE — Progress Notes (Addendum)
Subjective:    Patient ID: Anna Valentine, female    DOB: [birth year], 69 y.o.   MRN: [id number]  HPI 69 year old Female had iron infusion on September 2 and had some right shoulder pain radiating down her arm with chest tightness and shortness of breath.  Feraheme infusion was paused.  Patient was given Pepcid 20 mg IV and was able to complete the infusion.  Her hemoglobin was 9.6 g on August 30.  She has a history of iron deficiency.  Iron level was low at 28 on August 30.  She is here for follow-up on anemia and is also here for Medicare wellness visit.  Sodium was normal on August 30 as well as potassium and chloride.  Bicarbonate was 26.    Patient has been having some low back pain without sciatica.  I think it might just be musculoskeletal pain.  I think she looks much better than she did a few weeks ago.  She seems to look stronger and more energetic and in less distress.  Her friends have been very supportive.  She is eating better.  She has a history of non-small cell lung cancer stage I a 2006 status post right upper lobe wedge resection, middle lobe wedge resection, left lower lobectomy and mediastinal lymph node dissection.  Pathology showed 1.2 cm poorly differentiated adenocarcinoma.  Has been [facility] and also by cancer center here, Dr. Jana Hakim.  Apparently there is an ill-defined hypermetabolic nodular opacity in the right medial lung and an ill-defined second lesion hypermetabolic in the medial anterior right lung.  There is a 1.8 cm nodular density in the lingula noted to be new in 2020.  History of bronchiectasis.  Also a 9 mm irregular nodule was seen in the right lung base not present on prior study.  In February 2011 she was diagnosed with breast cancer and underwent left lumpectomy with a diagnosis of lobular carcinoma.  Sentinel node biopsy showed 1 out of 4 lymph nodes positive for invasive lobular carcinoma.  She was clinically stage II.  Tumor was ER positive  PR positive HER-2/neu negative.  Radiation therapy completed July 2011.  Bilateral tubal ligation 1994.  Left bunionectomy 1998.  Right bunionectomy 1992.  Patient underwent bilateral total mastectomies with right sentinel lymph node biopsy September 2019.  Had right breast cancer triple positive at that time.  Dr. Aundra Dubin has her on low-dose losartan, Tylenol.  She had atypical chest pain in June 2019 Cardiolite study at that time showed no ischemia.  Dr. Trey Paula determined she had pulmonary hypertension.  Tiny avulsion fracture left ankle 2019.  Last colonoscopy was in 2018.  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  Social history: She is a widow.  No children.  She smoked 1.5 to 2 packs of cigarettes daily for some 15 years but quit around 1991.  No alcohol consumption.  She completed 4 years of college.  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  She resides alone.  Has sister in [place] and brother in [place] see above-has frailty and fatigue.  Appetite has improved.  Weight has improved.     Objective:   Physical Exam Temperature 97.7 degrees orally blood pressure 110/70 pulse 88 and regular.  Weight is 100 pounds.  BMI 16.14. She has gained 5 pounds since August 24.  She was admitted to the hospital August 12-16 with generalized weakness and sodium of 119.  I am very pleased with her recent sodium on August 30 of 136.  Her chest is fairly clear.  Cardiac exam regular rate and rhythm.  Her affect is much brighter.  No lower extremity edema.     Assessment & Plan:  Hyponatremia-resolved.  Is eating better.  Weight loss-improved.  In August she weighed 95 pounds.  History of MAC being followed by Dr. Megan Salon.  Has thick walled cavitary lesion right upper lobe.  Also area in right middle lobe and right hilum.  Persistent anemia-has received iron infusions.  We will follow-up with her in early October.  She  is asking about when to repeat iron infusion.  I will leave that up to her other physicians.  She seems less depressed and stronger physically  Remote history of lung cancer and history of bronchiectasis followed by Dr. Lamonte Sakai.  History of breast cancer  Weight loss-improved  Plan: Follow-up anemia studies in November.  Subjective:   Patient presents for Medicare Annual/Subsequent preventive examination.  Review Past Medical/Family/Social: See above   Risk Factors  Current exercise habits: Not able to do much exercise due to weakness and deconditioning Dietary issues discussed: Caloric intake discussed  Cardiac risk factors: Hyperlipidemia  Depression Screen  (Note: if answer to either of the following is "Yes", a more complete depression screening is indicated)   Over the past two weeks, have you felt down, depressed or hopeless?  Occasionally Over the past two weeks, have you felt little interest or pleasure in doing things? No Have you lost interest or pleasure in daily life? No Do you often feel hopeless? No Do you cry easily over simple problems? No   Activities of Daily Living  In your present state of health, do you have any difficulty performing the following activities?:   Driving? No  Managing money? No  Feeding yourself? No  Getting from bed to chair? No  Climbing a flight of stairs? No  Preparing food and eating?: No  Bathing or showering? No  Getting dressed: No  Getting to the toilet? No  Using the toilet:No  Moving around from place to place: No  In the past year have you fallen or had a near fall?:No  Are you sexually active? No  Do you have more than one partner? No   Hearing Difficulties: No  Do you often ask people to speak up or repeat themselves? No  Do you experience ringing or noises in your ears? No  Do you have difficulty understanding soft or whispered voices? No  Do you feel that you have a problem with memory? No Do you often misplace  items? No    Home Safety:  Do you have a smoke alarm at your residence? Yes Do you have grab bars in the bathroom? Do you have throw rugs in your house?   Cognitive Testing  Alert? Yes Normal Appearance?Yes  Oriented to person? Yes Place? Yes  Time? Yes  Recall of three objects? Yes  Can perform simple calculations? Yes  Displays appropriate judgment?Yes  Can read the correct time from a watch face?Yes   List the Names of Other Physician/Practitioners you currently use:  See referral list for the physicians patient is currently seeing.  Oncologist  Infectious disease specialist   Review of Systems: See above   Objective:     General appearance: Appears younger than stated age but frail Head: Normocephalic, without obvious abnormality, atraumatic  Eyes: conj clear, EOMi PEERLA  Ears: normal TM's and external ear  canals both ears  Nose: Nares normal. Septum midline. Mucosa normal. No drainage or sinus tenderness.  Throat: lips, mucosa, and tongue normal; teeth and gums normal  Neck: no adenopathy, no carotid bruit, no JVD, supple, symmetrical, trachea midline and thyroid not enlarged, symmetric, no tenderness/mass/nodules  No CVA tenderness.  Lungs: clear to auscultation bilaterally  Breasts: normal appearance, no masses or tenderness Heart: regular rate and rhythm, S1, S2 normal, no murmur, click, rub or gallop  Abdomen: soft, non-tender; bowel sounds normal; no masses, no organomegaly  Musculoskeletal: ROM normal in all joints, no crepitus, no deformity, Normal muscle strengthen. Back  is symmetric, no curvature. Skin: Skin color, texture, turgor normal. No rashes or lesions  Lymph nodes: Cervical, supraclavicular, and axillary nodes normal.  Neurologic: CN 2 -12 Normal, Normal symmetric reflexes. Normal coordination and gait  Psych: Alert & Oriented x 3, Mood appear stable.    Assessment:    Annual wellness medicare exam   Plan:    During the course of the  visit the patient was educated and counseled about appropriate screening and preventive services including:    Should get Covid booster  Has had Prevnar 13 and pneumococcal 23 vaccines.  Tetanus immunization is up-to-date.  Gets annual flu vaccine.    Patient Instructions (the written plan) was given to the patient.  Medicare Attestation  I have personally reviewed:  The patient's medical and social history  Their use of alcohol, tobacco or illicit drugs  Their current medications and supplements  The patient's functional ability including ADLs,fall risks, home safety risks, cognitive, and hearing and visual impairment  Diet and physical activities  Evidence for depression or mood disorders  The patient's weight, height, BMI, and visual acuity have been recorded in the chart. I have made referrals, counseling, and provided education to the patient based on review of the above and I have provided the patient with a written personalized care plan for preventive services.

## 2020-04-05 NOTE — Patient Instructions (Addendum)
I will plan to see you in early October.  Try to eat as well as possible.  Follow-up with Dr. Megan Salon regarding lung infection.  We will recheck labs in early October.  You have requested flu vaccine at that visit as well.

## 2020-04-16 ENCOUNTER — Other Ambulatory Visit: Payer: Self-pay

## 2020-04-16 ENCOUNTER — Ambulatory Visit: Payer: Medicare Other | Admitting: Internal Medicine

## 2020-04-16 DIAGNOSIS — E871 Hypo-osmolality and hyponatremia: Secondary | ICD-10-CM

## 2020-04-16 DIAGNOSIS — Z170 Estrogen receptor positive status [ER+]: Secondary | ICD-10-CM | POA: Diagnosis not present

## 2020-04-16 DIAGNOSIS — C50212 Malignant neoplasm of upper-inner quadrant of left female breast: Secondary | ICD-10-CM | POA: Diagnosis not present

## 2020-04-16 NOTE — Progress Notes (Signed)
Flu vaccine per CMA. Labs drawn.

## 2020-04-16 NOTE — Patient Instructions (Signed)
Flu Vaccine per CMA

## 2020-04-17 DIAGNOSIS — Z23 Encounter for immunization: Secondary | ICD-10-CM | POA: Diagnosis not present

## 2020-04-17 LAB — COMPLETE METABOLIC PANEL WITH GFR
AG Ratio: 1.1 (calc) (ref 1.0–2.5)
ALT: 10 U/L (ref 6–29)
AST: 16 U/L (ref 10–35)
Albumin: 3.5 g/dL — ABNORMAL LOW (ref 3.6–5.1)
Alkaline phosphatase (APISO): 66 U/L (ref 37–153)
BUN: 20 mg/dL (ref 7–25)
CO2: 27 mmol/L (ref 20–32)
Calcium: 8.9 mg/dL (ref 8.6–10.4)
Chloride: 103 mmol/L (ref 98–110)
Creat: 0.61 mg/dL (ref 0.50–0.99)
GFR, Est African American: 108 mL/min/{1.73_m2} (ref >=60)
GFR, Est Non African American: 93 mL/min/{1.73_m2} (ref >=60)
Globulin: 3.2 g/dL (calc) (ref 1.9–3.7)
Glucose, Bld: 94 mg/dL (ref 65–99)
Potassium: 4.3 mmol/L (ref 3.5–5.3)
Sodium: 138 mmol/L (ref 135–146)
Total Bilirubin: 0.3 mg/dL (ref 0.2–1.2)
Total Protein: 6.7 g/dL (ref 6.1–8.1)

## 2020-04-17 LAB — CBC WITH DIFFERENTIAL/PLATELET
Absolute Monocytes: 763 cells/uL (ref 200–950)
Basophils Absolute: 16 cells/uL (ref 0–200)
Basophils Relative: 0.2 %
Eosinophils Absolute: 41 cells/uL (ref 15–500)
Eosinophils Relative: 0.5 %
HCT: 30.1 % — ABNORMAL LOW (ref 35.0–45.0)
Hemoglobin: 9.7 g/dL — ABNORMAL LOW (ref 11.7–15.5)
Lymphs Abs: 746 cells/uL — ABNORMAL LOW (ref 850–3900)
MCH: 29.9 pg (ref 27.0–33.0)
MCHC: 32.2 g/dL (ref 32.0–36.0)
MCV: 92.9 fL (ref 80.0–100.0)
MPV: 10 fL (ref 7.5–12.5)
Monocytes Relative: 9.3 %
Neutro Abs: 6634 cells/uL (ref 1500–7800)
Neutrophils Relative %: 80.9 %
Platelets: 362 10*3/uL (ref 140–400)
RBC: 3.24 10*6/uL — ABNORMAL LOW (ref 3.80–5.10)
RDW: 16.3 % — ABNORMAL HIGH (ref 11.0–15.0)
Total Lymphocyte: 9.1 %
WBC: 8.2 10*3/uL (ref 3.8–10.8)

## 2020-04-17 LAB — FERRITIN: Ferritin: 1483 ng/mL — ABNORMAL HIGH (ref 16–288)

## 2020-04-17 LAB — IRON, TOTAL/TOTAL IRON BINDING CAP
%SAT: 14 % (calc) — ABNORMAL LOW (ref 16–45)
Iron: 33 ug/dL — ABNORMAL LOW (ref 45–160)
TIBC: 235 mcg/dL (calc) — ABNORMAL LOW (ref 250–450)

## 2020-04-23 ENCOUNTER — Ambulatory Visit (INDEPENDENT_AMBULATORY_CARE_PROVIDER_SITE_OTHER): Payer: Medicare Other | Admitting: Internal Medicine

## 2020-04-23 ENCOUNTER — Other Ambulatory Visit: Payer: Self-pay

## 2020-04-23 DIAGNOSIS — A310 Pulmonary mycobacterial infection: Secondary | ICD-10-CM

## 2020-04-23 NOTE — Assessment & Plan Note (Signed)
Anna Valentine is doing much better since stopping her Mycobacterium avium 3 drug antibiotic regimen.  I see no current indication to rechallenge her.  She will follow-up here in 3 months.

## 2020-04-23 NOTE — Progress Notes (Signed)
[facility] for Infectious Disease  Patient Active Problem List   Diagnosis Date Noted  . Mycobacterium avium infection ([place]) 02/11/2020    Priority: High  . Intractable nausea and vomiting 02/20/2020    Priority: Medium  . Weight loss, non-intentional 09/20/2019    Priority: Medium  . Skin rash 02/28/2020  . Hyponatremia   . Dehydration with hyponatremia 02/20/2020  . Low back pain 02/20/2020  . Chronic diastolic CHF (congestive heart failure) ([place]) 02/20/2020  . Essential hypertension 02/20/2020  . Acute metabolic encephalopathy 94/17/4081  . Right epiretinal membrane 01/30/2020  . 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 Nuclear sclerotic cataract of right eye 01/30/2020  . Nuclear sclerotic cataract of left eye 01/30/2020  . Iron deficiency anemia 01/20/2020  . Malignant neoplasm of overlapping sites of right breast in female, estrogen receptor positive ([place]) 09/20/2019  . Postprocedural pneumothorax   . 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 Port-A-Cath in place 04/26/2018  . Genetic testing 02/20/2018  . Family history of breast cancer   . Family history of lung cancer   . Personal history of lung cancer   . Chest pain 11/28/2017  . Abnormal CT of the chest 11/30/2016  . Malignant neoplasm of upper-inner quadrant of left breast in female, estrogen receptor positive ([place]) 06/16/2014  . Osteopenia 09/13/2013  . Postmenopausal atrophic vaginitis 09/13/2013  . Vitamin D deficiency 09/13/2013  . Malignant neoplasm of lower lobe of right lung ([place]) 07/24/2012  . History of lung cancer 03/11/2011  . Anxiety 03/11/2011  . History of neutropenia 03/11/2011  . Family history of colon cancer 03/11/2011  . Cough 11/15/2010  . Bronchiectasis without complication ([place]) 44/81/8563    Patient's Medications  New Prescriptions   No medications on file  Previous Medications   ACYCLOVIR (ZOVIRAX) 400 MG TABLET    Take 1 tablet (400 mg total) by mouth 2 (two) times daily.    ALBUTEROL (VENTOLIN HFA) 108 (90 BASE) MCG/ACT INHALER    Inhale 2 puffs into the lungs every 6 (six) hours as needed for wheezing or shortness of breath.   FOLIC ACID (FOLVITE) 1 MG TABLET    Take 1 tablet (1 mg total) by mouth daily.   GABAPENTIN (NEURONTIN) 300 MG CAPSULE    Take 1 capsule (300 mg total) by mouth at bedtime.   MULTIPLE VITAMIN (MULTIVITAMIN) CAPSULE    Take 1 capsule by mouth daily.   MULTIPLE VITAMINS-MINERALS (MULTIVITAMIN WITH MINERALS) TABLET    Take 1 tablet by mouth daily.    ONDANSETRON (ZOFRAN) 4 MG TABLET    Take 2 tablets (8 mg total) by mouth every 6 (six) hours as needed for nausea or vomiting.   PANTOPRAZOLE (PROTONIX) 40 MG TABLET    TAKE 1 TABLET (40 MG TOTAL) BY MOUTH DAILY.   PSYLLIUM (METAMUCIL PO)    Take 1 Scoop by mouth daily as needed (constipation).    SODIUM CHLORIDE HYPERTONIC 3 % NEBULIZER SOLUTION    SMARTSIG:Via Nebulizer Morning-Night   TAMOXIFEN (NOLVADEX) 20 MG TABLET    Take 20 mg by mouth at bedtime.   Modified Medications   No medications on file  Discontinued Medications   No medications on file    Subjective: Anna Valentine is in for her hospital follow-up visit.  She has a history of bronchiectasis and cavitary lung disease.  She also has a remote history of lung cancer and underwent right lower lobectomy.  She is currently being treated for breast cancer.  She had an  abnormal PET scan last year and underwent bronchoscopy last July.  Cultures were positive for Mycobacterium avium.  She was not having fever, cough or shortness of breath so she was not treated at that time.  Her husband died in 12-22-22 and shortly afterward and she began to develop fever, chills, sweats, productive cough and shortness of breath.  A CT scan was obtained on 12/18/2019 which showed a new mass in the right hilum, increased thickening of the wall of the cavitary lesion in the right upper lobe and new extensive nodularity throughout the right lung.  A PET scan was obtained on  12/31/2019 which showed increased metabolic activity in the right lung.  She underwent bronchoscopy on 01/08/2020.  Pathology review showed granulomatous and acute inflammation.  There was no evidence of malignancy.  AFB cultures grew Mycobacterium avium again.    Her isolate was sensitive to macrolide antibiotics.  She was started on 3 times weekly azithromycin, ethambutol and rifampin on 01/22/2020.  She recalls having some nausea after starting her antibiotics.  I saw her in early August and changed her to daily dosing.   She had been having some unintentional weight loss since this past January her nausea got worse and she started losing more weight.  She also had some abdominal and back pain and started having fever, chills and sweats leading to admission to the hospital on 02/20/2020.  Her CT scanning showed that the masslike lesion in the right upper lobe now had more cystic changes.  She was seen by my partners who recommended holding her 3 antibiotics.  She is now feeling dramatically better.  She notes a mild cough at night and some first thing in the morning.  She only brings up a small amount of sputum.  She has some intermittent shortness of breath but all of that she is very stable and she feels remarkably better than when she was in the hospital.  She says that she knows she is better because she was able to take care of a 41-year-old boy over the weekend without wearing herself out.  She has already had her influenza vaccine and is scheduled to get her [place] booster next month.  She is scheduled for a repeat chest CT scan in January.  Review of Systems: Review of Systems  Constitutional: Positive for malaise/fatigue. Negative for chills, diaphoresis, fever and weight loss.  HENT: Negative for sore throat.   Respiratory: Positive for cough, sputum production and shortness of breath. Negative for hemoptysis and wheezing.   Cardiovascular: Negative for chest pain.  Gastrointestinal:  Negative for abdominal pain, diarrhea, nausea and vomiting.  Skin: Negative for rash.    Past Medical History:  Diagnosis Date  . Anemia   . BRCA negative 2011   BRCA I/ II negative  . Breast cancer ([place]) 08/2009   stage 2, rx with lumpectomy and xrt  . Bronchiectasis ([place])   . Chronic diastolic CHF (congestive heart failure) ([place]) 02/20/2020  . Constipation   . Essential hypertension 02/20/2020  . Family history of adverse reaction to anesthesia    My Sister has nausea  . Family history of breast cancer   . Family history of colon cancer   . Family history of lung cancer   . GERD (gastroesophageal reflux disease)    not presently having symptoms  . Hypertension   . Iatrogenic pneumothorax 02/06/2019  . Lung cancer ([place]) 06/06/05   stage 1 poorly differentiated adenocarcinoma, s/p right lower lobectomy.  . Osteopenia  09/13/2013  . Osteoporosis   . Personal history of lung cancer   . Pneumonia    2007ish, walking pnemonia - 2009 ish  . STD (sexually transmitted disease)    HSV  . Vitamin D deficiency 09/13/2013    Social History   Tobacco Use  . Smoking status: Former Smoker    Packs/day: 2.00    Years: 18.00    Pack years: 36.00    Quit date: 07/12/1987    Years since quitting: 32.8  . Smokeless tobacco: Never Used  Vaping Use  . Vaping Use: Never used  Substance Use Topics  . Alcohol use: Yes    Alcohol/week: 14.0 standard drinks    Types: 7 Glasses of wine, 7 Shots of liquor per week    Comment: social  . Drug use: No    Family History  Problem Relation Age of Onset  . Allergies Mother   . Asthma Mother   . Lung cancer Mother   . Breast cancer Mother 21       recurrence age 45  . Colon cancer Father 70  . Prostate cancer Brother 38  . Breast cancer Sister 54       Recurrence age 78 BRCA negative  . Colon polyps Sister   . Leukemia Sister   . Breast cancer Sister 36  . Prostate cancer Brother 82  . Breast cancer Maternal Grandmother 25  . Colon cancer  Maternal Aunt   . Leukemia Maternal Grandfather   . Lung cancer Maternal Aunt   . Breast cancer Cousin   . Esophageal cancer Neg Hx   . Rectal cancer Neg Hx   . Stomach cancer Neg Hx     Allergies  Allergen Reactions  . Taxol [Paclitaxel] Anaphylaxis  . Tape Itching  . Abraxane [Paclitaxel Protein-Bound Part] Rash  . Clarithromycin Rash    Objective: There were no vitals filed for this visit. There is no height or weight on file to calculate BMI.  Physical Exam Constitutional:      Comments: She is in good spirits.  She is accompanied by her friend, Helene Kelp.  Cardiovascular:     Rate and Rhythm: Normal rate and regular rhythm.     Heart sounds: No murmur heard.   Pulmonary:     Effort: Pulmonary effort is normal.     Breath sounds: Rales present. No wheezing.     Comments: She has a few right-sided crackles posteriorly. Abdominal:     Palpations: Abdomen is soft.     Tenderness: There is no abdominal tenderness.  Musculoskeletal:        General: No swelling or tenderness.  Skin:    Findings: No rash.  Neurological:     General: No focal deficit present.  Psychiatric:        Mood and Affect: Mood normal.     Lab Results    Problem List Items Addressed This Visit      High   Mycobacterium avium infection ([place])    Madden is doing much better since stopping her Mycobacterium avium 3 drug antibiotic regimen.  I see no current indication to rechallenge her.  She will follow-up here in 3 months.          Michel Bickers, MD [facility] for [facility] [place] [facility] [phone] pager   [phone] cell 04/23/2020, 11:40 AM

## 2020-04-30 ENCOUNTER — Encounter: Payer: Self-pay | Admitting: Emergency Medicine

## 2020-04-30 ENCOUNTER — Other Ambulatory Visit: Payer: Self-pay

## 2020-04-30 ENCOUNTER — Ambulatory Visit (INDEPENDENT_AMBULATORY_CARE_PROVIDER_SITE_OTHER): Payer: Medicare Other | Admitting: Emergency Medicine

## 2020-04-30 DIAGNOSIS — J479 Bronchiectasis, uncomplicated: Secondary | ICD-10-CM

## 2020-04-30 NOTE — Progress Notes (Signed)
Subjective:    Patient ID: Anna Valentine, female    DOB: [birth year], 69 y.o.   MRN: [id number]  HPI  ROV 03/05/20 --Anna Valentine 69 and was admitted after our last visit 8/12 when she presented with sweats, weakness, fatigue, found nausea and poor p.o. intake.  Her lab work showed severe hyponatremia.  She has recently been started on azithromycin, ethambutol, rifampin for her significant MAIC affecting the entire right lung.  This had to be stopped in the hospital due to the symptoms described above.  With IV fluids her sodium improved to 134.  It was 133 on 8/24.  Today she describes significant improvement in her energy, nausea.   ROV 04/30/20 --follow-up visit for 69 year old woman with a history of right lower lobectomy due to non-small cell lung cancer, breast cancer on tamoxifen, severe bronchiectasis and structural abnormalities with cavitary disease in the setting of Mycobacterium avium.  As above she became severely ill when we tried to initiate 3 drug therapy, was hospitalized, had severe hyponatremia.  She feels markedly improved since the antibiotic regimen was held.  She has been following closely with Dr. Megan Salon in infectious diseases as well.  Currently on surveillance.  Her most recent CT chest was 02/20/2020, possibly improved on antibiotics even though only partial treatment. She reports some chills, fever in the last week. Cough is a bit worse as well - she has green mucous. This is all new over about 1 week.    Review of Systems  Constitutional: Positive for activity change and fatigue. Negative for fever and unexpected weight change.  HENT: Negative.  Negative for congestion, dental problem, ear pain, nosebleeds, postnasal drip, rhinorrhea, sinus pressure, sneezing, sore throat and trouble swallowing.   Eyes: Negative.  Negative for redness and itching.  Respiratory: Negative.  Negative for cough, chest tightness, shortness of breath and wheezing.   Cardiovascular: Negative.   Negative for palpitations and leg swelling.  Gastrointestinal: Negative.  Negative for nausea and vomiting.  Genitourinary: Negative.  Negative for dysuria.  Musculoskeletal: Positive for back pain. Negative for joint swelling.  Skin: Negative.  Negative for rash.  Neurological: Negative.  Negative for headaches.  Hematological: Negative.  Does not bruise/bleed easily.  Psychiatric/Behavioral: Negative.  Negative for dysphoric mood. The patient is not nervous/anxious.         Objective:   Physical Exam Vitals:   04/30/20 1338  BP: 116/68  Pulse: 87  Temp: (!) 97.3 F (36.3 C)  TempSrc: Temporal  SpO2: 98%  Weight: 100 lb 9.6 oz (45.6 kg)  Height: 5\' 6"  (1.676 m)   Gen: tired, ill appearing, thin woman, well-nourished, in no distress  ENT: No lesions,  mouth clear,  oropharynx clear, no postnasal drip  Neck: No JVD, no stridor  Lungs: No use of accessory muscles, coarse R lung with some inspiratory and expiratory rhonchi. L is clear  Cardiovascular: RRR, heart sounds normal, no murmur or gallops, no peripheral edema  Musculoskeletal: No deformities, no cyanosis or clubbing  Neuro: alert, non focal  Skin: Warm, no lesions or rash     Assessment & Plan:  Bronchiectasis without complication (HCC) With Mycobacterium avium infection.  She has started to have some chills, fevers over the last week.  She was not having these when she saw Dr. Megan Salon.  I am concerned that this will progress.  We can continue her on observation but will need to consider therapy for her [facility] if/when clinically indicated.  In the meantime we will  plan for a repeat CT chest in December to see if there is any change in her parenchymal injury.  If things progress am hoping that she can still be treated medically, will not require lung resection.  We will plan to repeat your CT scan of the chest without contrast in December Keep your albuterol available use 2 puffs if needed for shortness of breath,  chest tightness, wheezing, mucus clearance You could consider using your flutter valve if you need it to help with cough, mucus clearance We will hold off on restarting hypertonic saline nebulizer treatments for now Please call either our office or Dr. Megan Salon if you have progression of clinical symptoms including chills, fever, sweats, increased mucus production, shortness of breath Follow with Dr. Lamonte Sakai in December after your CT so that we can review the results together.  Baltazar Apo, MD, PhD 04/30/2020, 1:56 PM [facility] [phone] or if no answer [phone]

## 2020-04-30 NOTE — Assessment & Plan Note (Signed)
With Mycobacterium avium infection.  She has started to have some chills, fevers over the last week.  She was not having these when she saw Dr. Megan Salon.  I am concerned that this will progress.  We can continue her on observation but will need to consider therapy for her [facility] if/when clinically indicated.  In the meantime we will plan for a repeat CT chest in December to see if there is any change in her parenchymal injury.  If things progress am hoping that she can still be treated medically, will not require lung resection.  We will plan to repeat your CT scan of the chest without contrast in December Keep your albuterol available use 2 puffs if needed for shortness of breath, chest tightness, wheezing, mucus clearance You could consider using your flutter valve if you need it to help with cough, mucus clearance We will hold off on restarting hypertonic saline nebulizer treatments for now Please call either our office or Dr. Megan Salon if you have progression of clinical symptoms including chills, fever, sweats, increased mucus production, shortness of breath Follow with Dr. Lamonte Sakai in December after your CT so that we can review the results together.

## 2020-04-30 NOTE — Patient Instructions (Addendum)
We will plan to repeat your CT scan of the chest without contrast in December Keep your albuterol available use 2 puffs if needed for shortness of breath, chest tightness, wheezing, mucus clearance You could consider using your flutter valve if you need it to help with cough, mucus clearance We will hold off on restarting hypertonic saline nebulizer treatments for now Please call either our office or Dr. Megan Salon if you have progression of clinical symptoms including chills, fever, sweats, increased mucus production, shortness of breath Follow with Dr. Lamonte Sakai in December after your CT so that we can review the results together.

## 2020-04-30 NOTE — Addendum Note (Signed)
Addended by: Gavin Potters R on: 04/30/2020 04:13 PM   Modules accepted: Orders

## 2020-05-05 ENCOUNTER — Other Ambulatory Visit: Payer: Self-pay

## 2020-05-05 ENCOUNTER — Ambulatory Visit (INDEPENDENT_AMBULATORY_CARE_PROVIDER_SITE_OTHER): Payer: Medicare Other | Admitting: Internal Medicine

## 2020-05-05 ENCOUNTER — Encounter: Payer: Self-pay | Admitting: Internal Medicine

## 2020-05-05 DIAGNOSIS — J209 Acute bronchitis, unspecified: Secondary | ICD-10-CM | POA: Insufficient documentation

## 2020-05-05 MED ORDER — AZITHROMYCIN 500 MG PO TABS: 500.0000 mg | ORAL_TABLET | Freq: Every day | ORAL | 0 refills | Status: AC

## 2020-05-05 NOTE — Progress Notes (Signed)
[facility] for Infectious Disease  Patient Active Problem List   Diagnosis Date Noted  . Acute bronchitis 05/05/2020    Priority: High  . Mycobacterium avium infection (Anna Valentine) 02/11/2020    Priority: High  . Intractable nausea and vomiting 02/20/2020    Priority: Medium  . Weight loss, non-intentional 09/20/2019    Priority: Medium  . Skin rash 02/28/2020  . Hyponatremia   . Dehydration with hyponatremia 02/20/2020  . Low back pain 02/20/2020  . Chronic diastolic CHF (congestive heart failure) (Anna Valentine) 02/20/2020  . Essential hypertension 02/20/2020  . Acute metabolic encephalopathy 27/12/2374  . Right epiretinal membrane 01/30/2020  . 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 Nuclear sclerotic cataract of right eye 01/30/2020  . Nuclear sclerotic cataract of left eye 01/30/2020  . Iron deficiency anemia 01/20/2020  . Malignant neoplasm of overlapping sites of right breast in female, estrogen receptor positive (Anna Valentine) 09/20/2019  . Postprocedural pneumothorax   . 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 Port-A-Cath in place 04/26/2018  . Genetic testing 02/20/2018  . Family history of breast cancer   . Family history of lung cancer   . Personal history of lung cancer   . Chest pain 11/28/2017  . Abnormal CT of the chest 11/30/2016  . Malignant neoplasm of upper-inner quadrant of left breast in female, estrogen receptor positive (Anna Valentine) 06/16/2014  . Osteopenia 09/13/2013  . Postmenopausal atrophic vaginitis 09/13/2013  . Vitamin D deficiency 09/13/2013  . Malignant neoplasm of lower lobe of right lung (Anna Valentine) 07/24/2012  . History of lung cancer 03/11/2011  . Anxiety 03/11/2011  . History of neutropenia 03/11/2011  . Family history of colon cancer 03/11/2011  . Cough 11/15/2010  . Bronchiectasis without complication (Anna Valentine) 28/31/5176    Patient's Medications  New Prescriptions   AZITHROMYCIN (ZITHROMAX) 500 MG TABLET    Take 1 tablet (500 mg total) by mouth daily for 5 doses.   Previous Medications   ACYCLOVIR (ZOVIRAX) 400 MG TABLET    Take 1 tablet (400 mg total) by mouth 2 (two) times daily.   ALBUTEROL (VENTOLIN HFA) 108 (90 BASE) MCG/ACT INHALER    Inhale 2 puffs into the lungs every 6 (six) hours as needed for wheezing or shortness of breath.   FOLIC ACID (FOLVITE) 1 MG TABLET    Take 1 tablet (1 mg total) by mouth daily.   GABAPENTIN (NEURONTIN) 300 MG CAPSULE    Take 1 capsule (300 mg total) by mouth at bedtime.   MULTIPLE VITAMIN (MULTIVITAMIN) CAPSULE    Take 1 capsule by mouth daily.   MULTIPLE VITAMINS-MINERALS (MULTIVITAMIN WITH MINERALS) TABLET    Take 1 tablet by mouth daily.    ONDANSETRON (ZOFRAN) 4 MG TABLET    Take 2 tablets (8 mg total) by mouth every 6 (six) hours as needed for nausea or vomiting.   PANTOPRAZOLE (PROTONIX) 40 MG TABLET    TAKE 1 TABLET (40 MG TOTAL) BY MOUTH DAILY.   PSYLLIUM (METAMUCIL PO)    Take 1 Scoop by mouth daily as needed (constipation).    SODIUM CHLORIDE HYPERTONIC 3 % NEBULIZER SOLUTION    SMARTSIG:Via Nebulizer Morning-Night   TAMOXIFEN (NOLVADEX) 20 MG TABLET    Take 20 mg by mouth at bedtime.   Modified Medications   No medications on file  Discontinued Medications   No medications on file    Subjective: Anna Valentine is seen on a working basis.  About 10 days ago, shortly after her last visit here, she had sudden  onset of fever, chills and sweats.  She has gradually noticed slight worsening of her chronic cough with more sputum production and shortness of breath.  She has also had some stomach upset and she has noted that she has been quite thirsty.  She feels like she is eating and drinking normally.  She has not had any nausea, vomiting, diarrhea or dysuria.  Review of Systems: Review of Systems  Constitutional: Positive for chills, diaphoresis, fever and malaise/fatigue. Negative for weight loss.  Respiratory: Positive for cough, sputum production and shortness of breath. Negative for hemoptysis.     Cardiovascular: Negative for chest pain.  Gastrointestinal: Negative for diarrhea, nausea and vomiting.  Genitourinary: Negative for dysuria.  Musculoskeletal: Positive for back pain.    Past Medical History:  Diagnosis Date  . Anemia   . BRCA negative 2011   BRCA I/ II negative  . Breast cancer ([place]) 08/2009   stage 2, rx with lumpectomy and xrt  . Bronchiectasis ([place])   . Chronic diastolic CHF (congestive heart failure) ([place]) 02/20/2020  . Constipation   . Essential hypertension 02/20/2020  . Family history of adverse reaction to anesthesia    My Sister has nausea  . Family history of breast cancer   . Family history of colon cancer   . Family history of lung cancer   . GERD (gastroesophageal reflux disease)    not presently having symptoms  . Hypertension   . Iatrogenic pneumothorax 02/06/2019  . Lung cancer ([place]) 06/06/05   stage 1 poorly differentiated adenocarcinoma, s/p right lower lobectomy.  . Osteopenia 09/13/2013  . Osteoporosis   . Personal history of lung cancer   . Pneumonia    2007ish, walking pnemonia - 2009 ish  . STD (sexually transmitted disease)    HSV  . Vitamin D deficiency 09/13/2013    Social History   Tobacco Use  . Smoking status: Former Smoker    Packs/day: 2.00    Years: 18.00    Pack years: 36.00    Quit date: 07/12/1987    Years since quitting: 32.8  . Smokeless tobacco: Never Used  Vaping Use  . Vaping Use: Never used  Substance Use Topics  . Alcohol use: Yes    Alcohol/week: 14.0 standard drinks    Types: 7 Glasses of wine, 7 Shots of liquor per week    Comment: social  . Drug use: No    Family History  Problem Relation Age of Onset  . Allergies Mother   . Asthma Mother   . Lung cancer Mother   . Breast cancer Mother 67       recurrence age 18  . Colon cancer Father 21  . Prostate cancer Brother 51  . Breast cancer Sister 7       Recurrence age 90 BRCA negative  . Colon polyps Sister   . Leukemia Sister   . Breast cancer  Sister 29  . Prostate cancer Brother 67  . Breast cancer Maternal Grandmother 71  . Colon cancer Maternal Aunt   . Leukemia Maternal Grandfather   . Lung cancer Maternal Aunt   . Breast cancer Cousin   . Esophageal cancer Neg Hx   . Rectal cancer Neg Hx   . Stomach cancer Neg Hx     Allergies  Allergen Reactions  . Taxol [Paclitaxel] Anaphylaxis  . Tape Itching  . Abraxane [Paclitaxel Protein-Bound Part] Rash  . Clarithromycin Rash    Objective: Vitals:   05/05/20 1509  BP: 130/73  Pulse: 83  Temp: 98 F (36.7 C)  Weight: 102 lb 3.2 oz (46.4 kg)  Height: '5\' 6"'  (1.676 m)   Body mass index is 16.5 kg/m.  Physical Exam Constitutional:      Comments: Her spirits are good.  Her weight is unchanged.  Cardiovascular:     Rate and Rhythm: Normal rate and regular rhythm.     Heart sounds: No murmur heard.   Pulmonary:     Effort: Pulmonary effort is normal.     Breath sounds: Normal breath sounds. No wheezing, rhonchi or rales.  Abdominal:     Palpations: Abdomen is soft.     Tenderness: There is no abdominal tenderness.  Psychiatric:        Mood and Affect: Mood normal.       Problem List Items Addressed This Visit      High   Acute bronchitis    She has developed acute on chronic bronchitis.  This could possibly be due to a flare of Mycobacterium avium since she is not on treatment but it could be due to other, more common pathogens as well.  She has received both of her [place] Covid vaccines earlier this year.  I think acute Covid infection is unlikely now.  I will have her bring and sputum specimens in the next few days for routine and AFB culture.  She will get blood work today for CBC, CMP and blood cultures.  She will take azithromycin 500 mg daily for the next 5 days.  She will follow-up here next week      Relevant Medications   azithromycin (ZITHROMAX) 500 MG tablet   Other Relevant Orders   CBC   Comprehensive metabolic panel   Culture, blood  (single)   Culture, blood (single)   Respiratory or Resp and Sputum Culture   MYCOBACTERIA, CULTURE, WITH FLUOROCHROME SMEAR       Michel Bickers, MD [facility] for [place] [phone] pager   [phone] cell 05/05/2020, 3:32 PM

## 2020-05-05 NOTE — Telephone Encounter (Signed)
Called and spoke with patient per MD to schedule appointment at 3:15. Patient was able to accept appointment time. Would like for MD to know that her last temp readings; last night 102.1 before bed time. 99.2 this AM, but did take Tylenol. Patient would also like to let MD know that she has been feeling more thirsty. Has been drinking water, grape juice, and ginger ale. Advised she drink something with electrolytes today as well. Will discuss concerns with MD. Aundria Rud, [place]

## 2020-05-05 NOTE — Assessment & Plan Note (Signed)
She has developed acute on chronic bronchitis.  This could possibly be due to a flare of Mycobacterium avium since she is not on treatment but it could be due to other, more common pathogens as well.  She has received both of her [place] Covid vaccines earlier this year.  I think acute Covid infection is unlikely now.  I will have her bring and sputum specimens in the next few days for routine and AFB culture.  She will get blood work today for CBC, CMP and blood cultures.  She will take azithromycin 500 mg daily for the next 5 days.  She will follow-up here next week

## 2020-05-08 ENCOUNTER — Other Ambulatory Visit: Payer: Medicare Other

## 2020-05-08 ENCOUNTER — Other Ambulatory Visit: Payer: Self-pay

## 2020-05-08 DIAGNOSIS — J209 Acute bronchitis, unspecified: Secondary | ICD-10-CM

## 2020-05-11 LAB — COMPREHENSIVE METABOLIC PANEL
AG Ratio: 0.9 (calc) — ABNORMAL LOW (ref 1.0–2.5)
ALT: 31 U/L — ABNORMAL HIGH (ref 6–29)
AST: 25 U/L (ref 10–35)
Albumin: 3.3 g/dL — ABNORMAL LOW (ref 3.6–5.1)
Alkaline phosphatase (APISO): 152 U/L (ref 37–153)
BUN: 15 mg/dL (ref 7–25)
CO2: 26 mmol/L (ref 20–32)
Calcium: 8.9 mg/dL (ref 8.6–10.4)
Chloride: 98 mmol/L (ref 98–110)
Creat: 0.54 mg/dL (ref 0.50–0.99)
Globulin: 3.5 g/dL (calc) (ref 1.9–3.7)
Glucose, Bld: 116 mg/dL — ABNORMAL HIGH (ref 65–99)
Potassium: 3.8 mmol/L (ref 3.5–5.3)
Sodium: 133 mmol/L — ABNORMAL LOW (ref 135–146)
Total Bilirubin: 0.3 mg/dL (ref 0.2–1.2)
Total Protein: 6.8 g/dL (ref 6.1–8.1)

## 2020-05-11 LAB — CULTURE, BLOOD (SINGLE)
MICRO NUMBER:: 11131186
MICRO NUMBER:: 11136450
Result:: NO GROWTH
Result:: NO GROWTH
SPECIMEN QUALITY:: ADEQUATE
SPECIMEN QUALITY:: ADEQUATE

## 2020-05-11 LAB — CBC
HCT: 30.1 % — ABNORMAL LOW (ref 35.0–45.0)
Hemoglobin: 9.9 g/dL — ABNORMAL LOW (ref 11.7–15.5)
MCH: 30.3 pg (ref 27.0–33.0)
MCHC: 32.9 g/dL (ref 32.0–36.0)
MCV: 92 fL (ref 80.0–100.0)
MPV: 9.2 fL (ref 7.5–12.5)
Platelets: 438 Thousand/uL — ABNORMAL HIGH (ref 140–400)
RBC: 3.27 Million/uL — ABNORMAL LOW (ref 3.80–5.10)
RDW: 12.8 % (ref 11.0–15.0)
WBC: 10.6 Thousand/uL (ref 3.8–10.8)

## 2020-05-13 ENCOUNTER — Other Ambulatory Visit: Payer: Self-pay

## 2020-05-13 ENCOUNTER — Ambulatory Visit (INDEPENDENT_AMBULATORY_CARE_PROVIDER_SITE_OTHER): Payer: Medicare Other | Admitting: Internal Medicine

## 2020-05-13 ENCOUNTER — Encounter: Payer: Self-pay | Admitting: Internal Medicine

## 2020-05-13 DIAGNOSIS — Z23 Encounter for immunization: Secondary | ICD-10-CM | POA: Diagnosis not present

## 2020-05-13 DIAGNOSIS — J209 Acute bronchitis, unspecified: Secondary | ICD-10-CM

## 2020-05-13 NOTE — Assessment & Plan Note (Signed)
She improved with empiric azithromycin but it is not exactly clear why.  Pseudomonas would not improve on azithromycin so I doubt that was the culprit.  I double checked with our lab and the sputum sample is being sent for AFB stain and culture but results have been slow to return.  Continued observation off of antibiotics for now to see how she does and to check the results of the AFB stain and culture.  I will arrange follow-up in December following her repeat chest CT scan.

## 2020-05-13 NOTE — Progress Notes (Signed)
[facility] for Infectious Disease  Patient Active Problem List   Diagnosis Date Noted  . Acute bronchitis 05/05/2020    Priority: High  . Mycobacterium avium infection ([place]) 02/11/2020    Priority: High  . Intractable nausea and vomiting 02/20/2020    Priority: Medium  . Weight loss, non-intentional 09/20/2019    Priority: Medium  . Skin rash 02/28/2020  . Hyponatremia   . Dehydration with hyponatremia 02/20/2020  . Low back pain 02/20/2020  . Chronic diastolic CHF (congestive heart failure) ([place]) 02/20/2020  . Essential hypertension 02/20/2020  . Acute metabolic encephalopathy 60/10/5995  . Right epiretinal membrane 01/30/2020  . 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 Nuclear sclerotic cataract of right eye 01/30/2020  . Nuclear sclerotic cataract of left eye 01/30/2020  . Iron deficiency anemia 01/20/2020  . Malignant neoplasm of overlapping sites of right breast in female, estrogen receptor positive ([place]) 09/20/2019  . Postprocedural pneumothorax   . 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 Port-A-Cath in place 04/26/2018  . Genetic testing 02/20/2018  . Family history of breast cancer   . Family history of lung cancer   . Personal history of lung cancer   . Chest pain 11/28/2017  . Abnormal CT of the chest 11/30/2016  . Malignant neoplasm of upper-inner quadrant of left breast in female, estrogen receptor positive ([place]) 06/16/2014  . Osteopenia 09/13/2013  . Postmenopausal atrophic vaginitis 09/13/2013  . Vitamin D deficiency 09/13/2013  . Malignant neoplasm of lower lobe of right lung ([place]) 07/24/2012  . History of lung cancer 03/11/2011  . Anxiety 03/11/2011  . History of neutropenia 03/11/2011  . Family history of colon cancer 03/11/2011  . Cough 11/15/2010  . Bronchiectasis without complication ([place]) 74/14/2395    Patient's Medications  New Prescriptions   No medications on file  Previous Medications   ACYCLOVIR (ZOVIRAX) 400 MG TABLET    Take 1  tablet (400 mg total) by mouth 2 (two) times daily.   ALBUTEROL (VENTOLIN HFA) 108 (90 BASE) MCG/ACT INHALER    Inhale 2 puffs into the lungs every 6 (six) hours as needed for wheezing or shortness of breath.   FOLIC ACID (FOLVITE) 1 MG TABLET    Take 1 tablet (1 mg total) by mouth daily.   GABAPENTIN (NEURONTIN) 300 MG CAPSULE    Take 1 capsule (300 mg total) by mouth at bedtime.   MULTIPLE VITAMIN (MULTIVITAMIN) CAPSULE    Take 1 capsule by mouth daily.   ONDANSETRON (ZOFRAN) 4 MG TABLET    Take 2 tablets (8 mg total) by mouth every 6 (six) hours as needed for nausea or vomiting.   PANTOPRAZOLE (PROTONIX) 40 MG TABLET    TAKE 1 TABLET (40 MG TOTAL) BY MOUTH DAILY.   PSYLLIUM (METAMUCIL PO)    Take 1 Scoop by mouth daily as needed (constipation).    SODIUM CHLORIDE HYPERTONIC 3 % NEBULIZER SOLUTION    SMARTSIG:Via Nebulizer Morning-Night   TAMOXIFEN (NOLVADEX) 20 MG TABLET    Take 20 mg by mouth at bedtime.   Modified Medications   No medications on file  Discontinued Medications   MULTIPLE VITAMINS-MINERALS (MULTIVITAMIN WITH MINERALS) TABLET    Take 1 tablet by mouth daily.     Subjective: Anna Valentine is in for her routine follow-up visit.  I treated her with a 5-day course of azithromycin last week for her flare of bronchitis, fever, chills or sweats.  She promptly improved and has not had any fever or sweats in 4  days.  She has not noted much improvement in her cough or sputum production.  She feels like her voice is very weak but not hoarse.  Her appetite remains good and stable.  She did have some upset stomach and one bout of diarrhea toward the tail end of her azithromycin treatment.  Review of Systems: Review of Systems  Constitutional: Positive for chills. Negative for diaphoresis, fever, malaise/fatigue and weight loss.  Respiratory: Positive for cough, sputum production and shortness of breath. Negative for hemoptysis and wheezing.   Cardiovascular: Negative for chest pain.    Gastrointestinal: Positive for diarrhea and nausea. Negative for abdominal pain and vomiting.    Past Medical History:  Diagnosis Date  . Anemia   . BRCA negative 2011   BRCA I/ II negative  . Breast cancer ([place]) 08/2009   stage 2, rx with lumpectomy and xrt  . Bronchiectasis ([place])   . Chronic diastolic CHF (congestive heart failure) ([place]) 02/20/2020  . Constipation   . Essential hypertension 02/20/2020  . Family history of adverse reaction to anesthesia    My Sister has nausea  . Family history of breast cancer   . Family history of colon cancer   . Family history of lung cancer   . GERD (gastroesophageal reflux disease)    not presently having symptoms  . Hypertension   . Iatrogenic pneumothorax 02/06/2019  . Lung cancer ([place]) 06/06/05   stage 1 poorly differentiated adenocarcinoma, s/p right lower lobectomy.  . Osteopenia 09/13/2013  . Osteoporosis   . Personal history of lung cancer   . Pneumonia    2007ish, walking pnemonia - 2009 ish  . STD (sexually transmitted disease)    HSV  . Vitamin D deficiency 09/13/2013    Social History   Tobacco Use  . Smoking status: Former Smoker    Packs/day: 2.00    Years: 18.00    Pack years: 36.00    Quit date: 07/12/1987    Years since quitting: 32.8  . Smokeless tobacco: Never Used  Vaping Use  . Vaping Use: Never used  Substance Use Topics  . Alcohol use: Yes    Alcohol/week: 14.0 standard drinks    Types: 7 Glasses of wine, 7 Shots of liquor per week    Comment: social  . Drug use: No    Family History  Problem Relation Age of Onset  . Allergies Mother   . Asthma Mother   . Lung cancer Mother   . Breast cancer Mother 52       recurrence age 31  . Colon cancer Father 10  . Prostate cancer Brother 30  . Breast cancer Sister 57       Recurrence age 28 BRCA negative  . Colon polyps Sister   . Leukemia Sister   . Breast cancer Sister 89  . Prostate cancer Brother 42  . Breast cancer Maternal Grandmother 10  . Colon  cancer Maternal Aunt   . Leukemia Maternal Grandfather   . Lung cancer Maternal Aunt   . Breast cancer Cousin   . Esophageal cancer Neg Hx   . Rectal cancer Neg Hx   . Stomach cancer Neg Hx     Allergies  Allergen Reactions  . Taxol [Paclitaxel] Anaphylaxis  . Tape Itching  . Abraxane [Paclitaxel Protein-Bound Part] Rash  . Clarithromycin Rash    Objective: Vitals:   05/13/20 1118  BP: 105/68  Pulse: 100  Temp: 97.9 F (36.6 C)  TempSrc: Oral  SpO2: 99%  Weight: 98 lb 12.8 oz (44.8 kg)  Height: '5\' 6"'  (1.676 m)   Body mass index is 15.95 kg/m.  Physical Exam Cardiovascular:     Rate and Rhythm: Normal rate and regular rhythm.  Pulmonary:     Effort: Pulmonary effort is normal.     Breath sounds: Normal breath sounds.  Psychiatric:        Mood and Affect: Mood normal.     Lab Results 05/08/2020 Sputum culture: Pseudomonas aeruginosa susceptible to all antibiotics tested Sputum AFB stain and culture: Pending Blood cultures: Negative   Problem List Items Addressed This Visit      High   Acute bronchitis    She improved with empiric azithromycin but it is not exactly clear why.  Pseudomonas would not improve on azithromycin so I doubt that was the culprit.  I double checked with our lab and the sputum sample is being sent for AFB stain and culture but results have been slow to return.  Continued observation off of antibiotics for now to see how she does and to check the results of the AFB stain and culture.  I will arrange follow-up in December following her repeat chest CT scan.          Michel Bickers, MD [facility] [place] [phone] pager   [phone] cell 05/13/2020, 11:48 AM

## 2020-05-14 ENCOUNTER — Telehealth: Payer: Self-pay | Admitting: *Deleted

## 2020-05-14 NOTE — Telephone Encounter (Signed)
Received call from [facility] that smear results from 10/29 sputum sample are available.  Routing to Dr Megan Salon for review. Landis Gandy, RN

## 2020-05-19 ENCOUNTER — Telehealth: Payer: Self-pay | Admitting: *Deleted

## 2020-05-19 NOTE — Telephone Encounter (Signed)
[facility] called with critical lab results, acid fast bacilli found in sputum from 10/29.  Dr Megan Salon has already seen this result, acknowledged this morning. Landis Gandy, RN

## 2020-05-21 ENCOUNTER — Telehealth: Payer: Self-pay

## 2020-05-21 NOTE — Telephone Encounter (Signed)
Received fax from [place] with sputum results. Results available in Epic.   Beryle Flock, RN

## 2020-05-22 ENCOUNTER — Ambulatory Visit: Payer: Medicare Other

## 2020-05-29 ENCOUNTER — Other Ambulatory Visit: Payer: Self-pay

## 2020-05-29 ENCOUNTER — Other Ambulatory Visit: Payer: Medicare Other | Admitting: Internal Medicine

## 2020-05-29 DIAGNOSIS — D649 Anemia, unspecified: Secondary | ICD-10-CM

## 2020-05-29 DIAGNOSIS — Z170 Estrogen receptor positive status [ER+]: Secondary | ICD-10-CM

## 2020-05-29 DIAGNOSIS — Z85118 Personal history of other malignant neoplasm of bronchus and lung: Secondary | ICD-10-CM | POA: Diagnosis not present

## 2020-05-29 DIAGNOSIS — E611 Iron deficiency: Secondary | ICD-10-CM | POA: Diagnosis not present

## 2020-05-29 DIAGNOSIS — Z853 Personal history of malignant neoplasm of breast: Secondary | ICD-10-CM

## 2020-05-29 DIAGNOSIS — M8580 Other specified disorders of bone density and structure, unspecified site: Secondary | ICD-10-CM | POA: Diagnosis not present

## 2020-05-29 DIAGNOSIS — C50212 Malignant neoplasm of upper-inner quadrant of left female breast: Secondary | ICD-10-CM | POA: Diagnosis not present

## 2020-05-30 LAB — CBC WITH DIFFERENTIAL/PLATELET
Absolute Monocytes: 629 cells/uL (ref 200–950)
Basophils Absolute: 22 cells/uL (ref 0–200)
Basophils Relative: 0.3 %
Eosinophils Absolute: 67 cells/uL (ref 15–500)
Eosinophils Relative: 0.9 %
HCT: 30.5 % — ABNORMAL LOW (ref 35.0–45.0)
Hemoglobin: 10.1 g/dL — ABNORMAL LOW (ref 11.7–15.5)
Lymphs Abs: 1014 cells/uL (ref 850–3900)
MCH: 30.2 pg (ref 27.0–33.0)
MCHC: 33.1 g/dL (ref 32.0–36.0)
MCV: 91.3 fL (ref 80.0–100.0)
MPV: 9.2 fL (ref 7.5–12.5)
Monocytes Relative: 8.5 %
Neutro Abs: 5668 cells/uL (ref 1500–7800)
Neutrophils Relative %: 76.6 %
Platelets: 373 10*3/uL (ref 140–400)
RBC: 3.34 10*6/uL — ABNORMAL LOW (ref 3.80–5.10)
RDW: 11.9 % (ref 11.0–15.0)
Total Lymphocyte: 13.7 %
WBC: 7.4 10*3/uL (ref 3.8–10.8)

## 2020-05-30 LAB — IRON,TIBC AND FERRITIN PANEL
%SAT: 20 % (calc) (ref 16–45)
Ferritin: 1566 ng/mL — ABNORMAL HIGH (ref 16–288)
Iron: 44 ug/dL — ABNORMAL LOW (ref 45–160)
TIBC: 220 mcg/dL (calc) — ABNORMAL LOW (ref 250–450)

## 2020-05-30 LAB — FOLATE: Folate: >24 ng/mL

## 2020-06-11 ENCOUNTER — Other Ambulatory Visit: Payer: Self-pay | Admitting: Internal Medicine

## 2020-06-16 ENCOUNTER — Ambulatory Visit (HOSPITAL_COMMUNITY)
Admission: RE | Admit: 2020-06-16 | Discharge: 2020-06-16 | Disposition: A | Discharge status: Home / Self Care | Payer: Medicare Other | Source: Ambulatory Visit | Attending: Emergency Medicine | Admitting: Emergency Medicine

## 2020-06-16 ENCOUNTER — Other Ambulatory Visit: Payer: Self-pay

## 2020-06-16 DIAGNOSIS — J479 Bronchiectasis, uncomplicated: Secondary | ICD-10-CM

## 2020-06-16 DIAGNOSIS — I2510 Atherosclerotic heart disease of native coronary artery without angina pectoris: Secondary | ICD-10-CM | POA: Diagnosis not present

## 2020-06-16 DIAGNOSIS — J181 Lobar pneumonia, unspecified organism: Secondary | ICD-10-CM | POA: Diagnosis not present

## 2020-06-16 DIAGNOSIS — J90 Pleural effusion, not elsewhere classified: Secondary | ICD-10-CM | POA: Diagnosis not present

## 2020-06-24 ENCOUNTER — Telehealth: Payer: Self-pay | Admitting: Internal Medicine

## 2020-06-24 NOTE — Telephone Encounter (Signed)
Can you come in to see me tomorrow morning at 10 AM?  Anna Valentine ===View-only below this line===   ----- Message -----      From:Anna Valentine      Sent:06/24/2020 11:33 AM EST        GB:TDVV Anna Salon, MD   Subject:Feeling sick  Starting Sunday I had an unsettled stomach. It settled down until Monday mid afternoon. That settled until Tuesday morning. I then started throwing up mucous and bile. I had shakes/chills without fever Monday morning. Same thing twice on Tuesday morning, and one this morning. Been having temp in the evening between 100 and 101. Did have 3 nights last week where it was normal.

## 2020-06-25 ENCOUNTER — Other Ambulatory Visit: Payer: Self-pay

## 2020-06-25 ENCOUNTER — Encounter: Payer: Self-pay | Admitting: Internal Medicine

## 2020-06-25 ENCOUNTER — Ambulatory Visit (INDEPENDENT_AMBULATORY_CARE_PROVIDER_SITE_OTHER): Payer: Medicare Other | Admitting: Internal Medicine

## 2020-06-25 ENCOUNTER — Ambulatory Visit: Payer: Medicare Other | Admitting: Internal Medicine

## 2020-06-25 DIAGNOSIS — J208 Acute bronchitis due to other specified organisms: Secondary | ICD-10-CM | POA: Diagnosis not present

## 2020-06-25 DIAGNOSIS — A310 Pulmonary mycobacterial infection: Secondary | ICD-10-CM

## 2020-06-25 LAB — RESPIRATORY CULTURE OR RESPIRATORY AND SPUTUM CULTURE
MICRO NUMBER:: 11141246
SPECIMEN QUALITY:: ADEQUATE

## 2020-06-25 LAB — MYCOBACTERIA,CULT W/FLUOROCHROME SMEAR
MICRO NUMBER:: 11147995
SPECIMEN QUALITY:: ADEQUATE

## 2020-06-25 MED ORDER — CIPROFLOXACIN HCL 500 MG PO TABS: 500.0000 mg | ORAL_TABLET | Freq: Two times a day (BID) | ORAL | 0 refills | Status: DC

## 2020-06-25 NOTE — Assessment & Plan Note (Addendum)
I suspect that her acute febrile illness is due to an exacerbation of her chronic bronchitis.  6 weeks ago she grew Pseudomonas and Mycobacterium avium (again) from sputum.  She improved with a short course of empiric azithromycin.  I will treat her with empiric ciprofloxacin pending repeat sputum cultures.  Her dysuria is very mild and intermittent.  I doubt that she has a symptomatic urinary tract infection.

## 2020-06-25 NOTE — Progress Notes (Signed)
[facility] for Infectious Disease  Patient Active Problem List   Diagnosis Date Noted   Acute bronchitis 05/05/2020    Priority: High   Mycobacterium avium infection ([place]) 02/11/2020    Priority: High   Intractable nausea and vomiting 02/20/2020    Priority: Medium   Weight loss, non-intentional 09/20/2019    Priority: Medium   Skin rash 02/28/2020   Hyponatremia    Dehydration with hyponatremia 02/20/2020   Low back pain 02/20/2020   Chronic diastolic CHF (congestive heart failure) ([place]) 02/20/2020   Essential hypertension 02/40/9735   Acute metabolic encephalopathy 32/99/2426   Right epiretinal membrane 01/30/2020   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   Nuclear sclerotic cataract of right eye 01/30/2020   Nuclear sclerotic cataract of left eye 01/30/2020   Iron deficiency anemia 01/20/2020   Malignant neoplasm of overlapping sites of right breast in female, estrogen receptor positive ([place]) 09/20/2019   Postprocedural pneumothorax    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   Port-A-Cath in place 04/26/2018   Genetic testing 02/20/2018   Family history of breast cancer    Family history of lung cancer    Personal history of lung cancer    Chest pain 11/28/2017   Abnormal CT of the chest 11/30/2016   Malignant neoplasm of upper-inner quadrant of left breast in female, estrogen receptor positive ([place]) 06/16/2014   Osteopenia 09/13/2013   Postmenopausal atrophic vaginitis 09/13/2013   Vitamin D deficiency 09/13/2013   Malignant neoplasm of lower lobe of right lung ([place]) 07/24/2012   History of lung cancer 03/11/2011   Anxiety 03/11/2011   History of neutropenia 03/11/2011   Family history of colon cancer 03/11/2011   Cough 11/15/2010   Bronchiectasis without complication ([place]) 83/41/9622    Patient's Medications  New Prescriptions   CIPROFLOXACIN (CIPRO) 500 MG TABLET    Take 1 tablet (500 mg total) by mouth 2 (two) times daily.   Previous Medications   ACYCLOVIR (ZOVIRAX) 400 MG TABLET    TAKE 1 TABLET BY MOUTH TWICE DAILY   ALBUTEROL (VENTOLIN HFA) 108 (90 BASE) MCG/ACT INHALER    Inhale 2 puffs into the lungs every 6 (six) hours as needed for wheezing or shortness of breath.   FOLIC ACID (FOLVITE) 1 MG TABLET    Take 1 tablet (1 mg total) by mouth daily.   GABAPENTIN (NEURONTIN) 300 MG CAPSULE    Take 1 capsule (300 mg total) by mouth at bedtime.   MULTIPLE VITAMIN (MULTIVITAMIN) CAPSULE    Take 1 capsule by mouth daily.   ONDANSETRON (ZOFRAN) 4 MG TABLET    Take 2 tablets (8 mg total) by mouth every 6 (six) hours as needed for nausea or vomiting.   PANTOPRAZOLE (PROTONIX) 40 MG TABLET    TAKE 1 TABLET (40 MG TOTAL) BY MOUTH DAILY.   PSYLLIUM (METAMUCIL PO)    Take 1 Scoop by mouth daily as needed (constipation).    SODIUM CHLORIDE HYPERTONIC 3 % NEBULIZER SOLUTION    SMARTSIG:Via Nebulizer Morning-Night   TAMOXIFEN (NOLVADEX) 20 MG TABLET    Take 20 mg by mouth at bedtime.   Modified Medications   No medications on file  Discontinued Medications   No medications on file    Subjective: Elianna is seen for a work in visit.  She started having intermittent fever and chills about 5 days ago.  Her temperature was 102.6 degrees last night.  She has also had some nausea and one episode of dry heaves.  She had  one episode of diarrhea several days ago.  She has had some very mild intermittent dysuria.  Her chronic cough has increased dramatically and she is now coughing up large amounts of ugly, green, foul tasting sputum.  Her cough most commonly occurs at night.  She has had increased dyspnea on exertion.  She had a follow-up chest CT on 06/17/2020 which showed:  IMPRESSION: 1. Redemonstrated postoperative findings of right lower lobectomy with severe bronchiectasis and cavitation of the remaining right lung. Cavitation has increased compared to prior examination dated 02/20/2020, with new, dense consolidation of the  dependent right upper lobe. Findings are consistent with ongoing infection. 2. Redemonstrated consolidation and bronchial plugging in the lingula with scattered, clustered centrilobular nodules in the left lower lobe, likewise in keeping with atypical infection. 3. Small right pleural effusion, unchanged. 4. Coronary artery disease.  Review of Systems: Review of Systems  Constitutional: Positive for chills, fever and malaise/fatigue. Negative for weight loss.  Respiratory: Positive for cough, sputum production and shortness of breath. Negative for hemoptysis and wheezing.   Cardiovascular: Negative for chest pain.  Gastrointestinal: Positive for diarrhea and nausea. Negative for abdominal pain and vomiting.  Genitourinary: Positive for dysuria.    Past Medical History:  Diagnosis Date   Anemia    BRCA negative 2011   BRCA I/ II negative   Breast cancer ([place]) 08/2009   stage 2, rx with lumpectomy and xrt   Bronchiectasis ([place])    Chronic diastolic CHF (congestive heart failure) ([place]) 02/20/2020   Constipation    Essential hypertension 02/20/2020   Family history of adverse reaction to anesthesia    My Sister has nausea   Family history of breast cancer    Family history of colon cancer    Family history of lung cancer    GERD (gastroesophageal reflux disease)    not presently having symptoms   Hypertension    Iatrogenic pneumothorax 02/06/2019   Lung cancer ([place]) 06/06/05   stage 1 poorly differentiated adenocarcinoma, s/p right lower lobectomy.   Osteopenia 09/13/2013   Osteoporosis    Personal history of lung cancer    Pneumonia    2007ish, walking pnemonia - 2009 ish   STD (sexually transmitted disease)    HSV   Vitamin D deficiency 09/13/2013    Social History   Tobacco Use   Smoking status: Former Smoker    Packs/day: 2.00    Years: 18.00    Pack years: 36.00    Quit date: 07/12/1987    Years since quitting: 32.9   Smokeless tobacco: Never Used   Vaping Use   Vaping Use: Never used  Substance Use Topics   Alcohol use: Yes    Alcohol/week: 14.0 standard drinks    Types: 7 Glasses of wine, 7 Shots of liquor per week    Comment: social   Drug use: No    Family History  Problem Relation Age of Onset   Allergies Mother    Asthma Mother    Lung cancer Mother    Breast cancer Mother 56       recurrence age 40   Colon cancer Father 67   Prostate cancer Brother 56   Breast cancer Sister 1       Recurrence age 58 BRCA negative   Colon polyps Sister    Leukemia Sister    Breast cancer Sister 74   Prostate cancer Brother 56   Breast cancer Maternal Grandmother 75   Colon cancer Maternal Aunt  Leukemia Maternal Grandfather    Lung cancer Maternal Aunt    Breast cancer Cousin    Esophageal cancer Neg Hx    Rectal cancer Neg Hx    Stomach cancer Neg Hx     Allergies  Allergen Reactions   Taxol [Paclitaxel] Anaphylaxis   Tape Itching   Abraxane [Paclitaxel Protein-Bound Part] Rash   Clarithromycin Rash    Objective: Vitals:   06/25/20 1016  BP: 101/65  Pulse: (!) 103  Temp: 97.6 F (36.4 C)  TempSrc: Oral  SpO2: 98%  Weight: 101 lb (45.8 kg)   Body mass index is 16.3 kg/m.  Physical Exam Constitutional:      Comments: She is calm and pleasant.  Her weight is stable.  Cardiovascular:     Rate and Rhythm: Normal rate and regular rhythm.     Heart sounds: No murmur heard.   Pulmonary:     Effort: Pulmonary effort is normal.     Breath sounds: Rales present. No wheezing or rhonchi.  Abdominal:     Palpations: Abdomen is soft.     Tenderness: There is no abdominal tenderness.  Psychiatric:        Mood and Affect: Mood normal.     Lab Results    Problem List Items Addressed This Visit      High   Mycobacterium avium infection ([place])    Her repeat chest CT scan suggest a strong possibility that she has smoldering and gradually worsening Mycobacterium avium pneumonia.   She did not tolerate her initial course of azithromycin, ethambutol and rifampin.  Once I see how she responds to empiric ciprofloxacin I will need to consider rechallenging her with Mycobacterium antibiotics.  She tolerates azithromycin well.  I suspect rifampin was the culprit when she was recently hospitalized and vomiting.      Acute bronchitis    I suspect that her acute febrile illness is due to an exacerbation of her chronic bronchitis.  6 weeks ago she grew Pseudomonas and Mycobacterium avium (again) from sputum.  She improved with a short course of empiric azithromycin.  I will treat her with empiric ciprofloxacin pending repeat sputum cultures.  Her dysuria is very mild and intermittent.  I doubt that she has a symptomatic urinary tract infection.      Relevant Medications   ciprofloxacin (CIPRO) 500 MG tablet   Other Relevant Orders   MYCOBACTERIA, CULTURE, WITH FLUOROCHROME SMEAR   Respiratory or Resp and Sputum Culture       Michel Bickers, MD [facility] for Infectious [place] [phone] pager   [phone] cell 06/25/2020, 11:01 AM

## 2020-06-25 NOTE — Assessment & Plan Note (Signed)
Her repeat chest CT scan suggest a strong possibility that she has smoldering and gradually worsening Mycobacterium avium pneumonia.  She did not tolerate her initial course of azithromycin, ethambutol and rifampin.  Once I see how she responds to empiric ciprofloxacin I will need to consider rechallenging her with Mycobacterium antibiotics.  She tolerates azithromycin well.  I suspect rifampin was the culprit when she was recently hospitalized and vomiting.

## 2020-06-26 ENCOUNTER — Other Ambulatory Visit: Payer: Medicare Other

## 2020-06-26 DIAGNOSIS — J208 Acute bronchitis due to other specified organisms: Secondary | ICD-10-CM

## 2020-06-26 NOTE — Addendum Note (Signed)
Addended by: Caffie Pinto on: 06/26/2020 10:45 AM   Modules accepted: Orders

## 2020-06-30 ENCOUNTER — Other Ambulatory Visit: Payer: Self-pay | Admitting: Internal Medicine

## 2020-06-30 DIAGNOSIS — J208 Acute bronchitis due to other specified organisms: Secondary | ICD-10-CM

## 2020-06-30 MED ORDER — BENZONATATE 100 MG PO CAPS: 100.0000 mg | ORAL_CAPSULE | Freq: Three times a day (TID) | ORAL | 1 refills | Status: DC | PRN

## 2020-06-30 NOTE — Telephone Encounter (Signed)
I called and spoke with Anna Valentine today.  She is feeling a little bit better but still bothered by productive cough that keeps her awake at night.  Her routine sputum culture grew only normal flora.  Her sputum AFB stain is positive again.  I suspect that her worsening cough is due to a flare of her known Mycobacterium avium pneumonia.  I talked to her about rechallenging her with Mycobacterium avium medications to see which one (most likely rifampin) caused her severe nausea and vomiting.  We decided to start her medications one at a time in early January prior to her follow-up visit with me on 07/22/2020.  If she cannot tolerate rifampin I will consider substituting clofazimine.

## 2020-07-01 ENCOUNTER — Encounter: Payer: Self-pay | Admitting: Internal Medicine

## 2020-07-01 ENCOUNTER — Telehealth: Payer: Self-pay | Admitting: Internal Medicine

## 2020-07-01 NOTE — Telephone Encounter (Signed)
Spoke with patient by phone today as I had question about her Medicare wellness exam.   She has some questions about anemia studies done in November. Her iron level was near normal at 44. Ferritin elevated at 1566 which I explained was an acute phase reactant and may be elevated due to Mycobacterium infection. I think Fe/TIBc is more reliable in her case due to ongoing infection. Currently there is not another iron infusion scheduled. Hgb stable and improved from 9.9 grams to 10.1 grams. MCV is normal at 91.3. Labs will be repeated in January. Appt made for lab draw only in January.

## 2020-07-01 NOTE — Addendum Note (Signed)
Addended by: [name] on: 07/01/2020 04:14 PM   Modules accepted: Level of Service

## 2020-07-15 ENCOUNTER — Encounter: Payer: Self-pay | Admitting: Internal Medicine

## 2020-07-15 ENCOUNTER — Encounter (INDEPENDENT_AMBULATORY_CARE_PROVIDER_SITE_OTHER): Payer: Self-pay

## 2020-07-15 DIAGNOSIS — Z79899 Other long term (current) drug therapy: Secondary | ICD-10-CM | POA: Diagnosis not present

## 2020-07-15 DIAGNOSIS — H35371 Puckering of macula, right eye: Secondary | ICD-10-CM | POA: Diagnosis not present

## 2020-07-15 DIAGNOSIS — H40013 Open angle with borderline findings, low risk, bilateral: Secondary | ICD-10-CM | POA: Diagnosis not present

## 2020-07-15 DIAGNOSIS — H2513 Age-related nuclear cataract, bilateral: Secondary | ICD-10-CM | POA: Diagnosis not present

## 2020-07-16 ENCOUNTER — Telehealth: Payer: Self-pay

## 2020-07-16 NOTE — Telephone Encounter (Signed)
Received call from [place] at [facility] to relay urgent lab results. Lab results are from Mycobacteria culture collected on 06/26/20 showing "acid-fast bacillus, Few (2+) acid-fast bacilli seen using the fluorochrome method" and "Mycobacterium, avium-intracellulare group; DNA probe result positive for Mycobacterium avium complex." Will route to provider.   Beryle Flock, RN

## 2020-07-17 ENCOUNTER — Ambulatory Visit (INDEPENDENT_AMBULATORY_CARE_PROVIDER_SITE_OTHER): Payer: Medicare Other | Admitting: Emergency Medicine

## 2020-07-17 ENCOUNTER — Other Ambulatory Visit: Payer: Self-pay

## 2020-07-17 ENCOUNTER — Encounter: Payer: Self-pay | Admitting: Emergency Medicine

## 2020-07-17 DIAGNOSIS — J479 Bronchiectasis, uncomplicated: Secondary | ICD-10-CM | POA: Diagnosis not present

## 2020-07-17 NOTE — Assessment & Plan Note (Signed)
Recently treated with ciprofloxacin for flaring symptoms, bronchitis/bronchiectasis.  She continues to be symptomatic and agree with Dr. Megan Salon that this in addition to her CT findings suggest that this is progressive MAIC.  I am concerned about the integrity of her left lung and agree that she needs to be treated.  Dr. Megan Salon is going to initiate a regimen that hopefully she can tolerate.  I did mention to her the possibility that if we cannot eradicate MAIC from her right lung or at least control it medically then we may need to consider right pneumonectomy at some point in the future.  Agree with plan to start antibiotics targeted at [facility] next week with Dr. Megan Salon. Use your albuterol 2 puffs if needed to help with mucus clearance. Continue your flutter valve as you needed to help with mucus clearance Follow Dr. Lamonte Sakai in about 3 months.  At that time we can talk about how you are doing on the antibiotics.  We will decide the timing of any repeat CT scan of the chest at that point.

## 2020-07-17 NOTE — Progress Notes (Signed)
Subjective:    Patient ID: Anna Valentine, female    DOB: October 27, 1950, 70 y.o.   MRN: [id number]  HPI  ROV 03/05/20 --Ms. Galka 69 and was admitted after our last visit 8/12 when she presented with sweats, weakness, fatigue, found nausea and poor p.o. intake.  Her lab work showed severe hyponatremia.  She has recently been started on azithromycin, ethambutol, rifampin for her significant MAIC affecting the entire right lung.  This had to be stopped in the hospital due to the symptoms described above.  With IV fluids her sodium improved to 134.  It was 133 on 8/24.  Today she describes significant improvement in her energy, nausea.   ROV 04/30/20 --follow-up visit for 70 year old woman with a history of right lower lobectomy due to non-small cell lung cancer, breast cancer on tamoxifen, severe bronchiectasis and structural abnormalities with cavitary disease in the setting of Mycobacterium avium.  As above she became severely ill when we tried to initiate 3 drug therapy, was hospitalized, had severe hyponatremia.  She feels markedly improved since the antibiotic regimen was held.  She has been following closely with Dr. Megan Salon in infectious diseases as well.  Currently on surveillance.  Her most recent CT chest was 02/20/2020, possibly improved on antibiotics even though only partial treatment. She reports some chills, fever in the last week. Cough is a bit worse as well - she has green mucous. This is all new over about 1 week.   ROV 07/17/20 --70 year old woman with complicated CT chest, history of a right lower lobectomy for non-small cell lung cancer and severe bronchiectasis with structural abnormalities and cavitary disease in the setting of Mycobacterium avium.  She also has a history of breast cancer on tamoxifen.  I tried to start her on 3 drug therapy for MAC but we had to stop this because she became very symptomatic with side effects.  She was starting to have some chills when I saw her in  October.  Most recent culture data from November grew out [facility] and Pseudomonas.  Today she reports dry cough. She does produce mucous mainly at night. Has albuterol but rarely uses.  She saw Dr. Megan Salon in mid December, was treated with empiric ciprofloxacin for acute bronchitis.  Plan is in place to try to rechallenge her with regimen for [facility] beginning next week.  Suspect that rifampin was the culprit that made her so sick.  Repeat CT scan of the chest performed 06/17/2020 reviewed by me, shows persistent severe bronchiectatic change and cavitation in the remaining right lung, probably increased compared with 02/20/2020 and with new dense consolidation of the dependent right upper lobe.  Also there is some consolidation and bronchial plugging in the lingula, micronodular disease consistent with atypical infection  MDM: Reviewed Dr Hale Bogus notes 06/25/20   Review of Systems  Constitutional: Positive for activity change and fatigue. Negative for fever and unexpected weight change.  HENT: Negative.  Negative for congestion, dental problem, ear pain, nosebleeds, postnasal drip, rhinorrhea, sinus pressure, sneezing, sore throat and trouble swallowing.   Eyes: Negative.  Negative for redness and itching.  Respiratory: Negative.  Negative for cough, chest tightness, shortness of breath and wheezing.   Cardiovascular: Negative.  Negative for palpitations and leg swelling.  Gastrointestinal: Negative.  Negative for nausea and vomiting.  Genitourinary: Negative.  Negative for dysuria.  Musculoskeletal: Positive for back pain. Negative for joint swelling.  Skin: Negative.  Negative for rash.  Neurological: Negative.  Negative for headaches.  Hematological:  Negative.  Does not bruise/bleed easily.  Psychiatric/Behavioral: Negative.  Negative for dysphoric mood. The patient is not nervous/anxious.         Objective:   Physical Exam Vitals:   07/17/20 1209  BP: 116/70  Pulse: 92  Temp: (!) 97.3  F (36.3 C)  TempSrc: Temporal  SpO2: 98%  Weight: 101 lb 6.4 oz (46 kg)  Height: _0  (1.676 m)   Gen: tired, ill appearing, thin woman, well-nourished, in no distress  ENT: No lesions,  mouth clear,  oropharynx clear, no postnasal drip  Neck: No JVD, no stridor  Lungs: No use of accessory muscles, coarse R lung with some inspiratory and expiratory rhonchi. L is clear  Cardiovascular: RRR, heart sounds normal, no murmur or gallops, no peripheral edema  Musculoskeletal: No deformities, no cyanosis or clubbing  Neuro: alert, non focal  Skin: Warm, no lesions or rash     Assessment & Plan:  Bronchiectasis without complication (HCC) Recently treated with ciprofloxacin for flaring symptoms, bronchitis/bronchiectasis.  She continues to be symptomatic and agree with Dr. Megan Salon that this in addition to her CT findings suggest that this is progressive MAIC.  I am concerned about the integrity of her left lung and agree that she needs to be treated.  Dr. Megan Salon is going to initiate a regimen that hopefully she can tolerate.  I did mention to her the possibility that if we cannot eradicate MAIC from her right lung or at least control it medically then we may need to consider right pneumonectomy at some point in the future.  Agree with plan to start antibiotics targeted at [facility] next week with Dr. Megan Salon. Use your albuterol 2 puffs if needed to help with mucus clearance. Continue your flutter valve as you needed to help with mucus clearance Follow Dr. Lamonte Sakai in about 3 months.  At that time we can talk about how you are doing on the antibiotics.  We will decide the timing of any repeat CT scan of the chest at that point.  Baltazar Apo, MD, PhD 07/17/2020, 1:45 PM [facility] [phone] or if no answer [phone]

## 2020-07-17 NOTE — Patient Instructions (Signed)
Agree with plan to start antibiotics targeted at [facility] next week with Dr. Megan Salon. Use your albuterol 2 puffs if needed to help with mucus clearance. Continue your flutter valve as you needed to help with mucus clearance Follow Dr. Lamonte Sakai in about 3 months.  At that time we can talk about how you are doing on the antibiotics.  We will decide the timing of any repeat CT scan of the chest at that point.

## 2020-07-22 ENCOUNTER — Other Ambulatory Visit: Payer: Self-pay

## 2020-07-22 ENCOUNTER — Ambulatory Visit (INDEPENDENT_AMBULATORY_CARE_PROVIDER_SITE_OTHER): Payer: Medicare Other | Admitting: Internal Medicine

## 2020-07-22 ENCOUNTER — Encounter: Payer: Self-pay | Admitting: Internal Medicine

## 2020-07-22 DIAGNOSIS — A310 Pulmonary mycobacterial infection: Secondary | ICD-10-CM | POA: Diagnosis not present

## 2020-07-22 MED ORDER — AZITHROMYCIN 500 MG PO TABS: 500.0000 mg | ORAL_TABLET | Freq: Every day | ORAL | 11 refills | Status: DC

## 2020-07-22 MED ORDER — ETHAMBUTOL HCL 400 MG PO TABS: 800.0000 mg | ORAL_TABLET | Freq: Every day | ORAL | 11 refills | Status: DC

## 2020-07-22 MED ORDER — CLOFAZIMINE POWD: 100.0000 mg | Freq: Every morning | 11 refills | Status: DC

## 2020-07-22 NOTE — Progress Notes (Signed)
[facility] for Infectious Disease  Patient Active Problem List   Diagnosis Date Noted  . Acute bronchitis 05/05/2020    Priority: High  . Mycobacterium avium infection ([place]) 02/11/2020    Priority: High  . Intractable nausea and vomiting 02/20/2020    Priority: Medium  . Weight loss, non-intentional 09/20/2019    Priority: Medium  . Skin rash 02/28/2020  . Hyponatremia   . Dehydration with hyponatremia 02/20/2020  . Low back pain 02/20/2020  . Chronic diastolic CHF (congestive heart failure) ([place]) 02/20/2020  . Essential hypertension 02/20/2020  . Acute metabolic encephalopathy 10/93/2355  . Right epiretinal membrane 01/30/2020  . 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 Nuclear sclerotic cataract of right eye 01/30/2020  . Nuclear sclerotic cataract of left eye 01/30/2020  . Iron deficiency anemia 01/20/2020  . Malignant neoplasm of overlapping sites of right breast in female, estrogen receptor positive ([place]) 09/20/2019  . Postprocedural pneumothorax   . 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 Port-A-Cath in place 04/26/2018  . Genetic testing 02/20/2018  . Family history of breast cancer   . Family history of lung cancer   . Personal history of lung cancer   . Chest pain 11/28/2017  . Abnormal CT of the chest 11/30/2016  . Malignant neoplasm of upper-inner quadrant of left breast in female, estrogen receptor positive ([place]) 06/16/2014  . Osteopenia 09/13/2013  . Postmenopausal atrophic vaginitis 09/13/2013  . Vitamin D deficiency 09/13/2013  . Malignant neoplasm of lower lobe of right lung ([place]) 07/24/2012  . History of lung cancer 03/11/2011  . Anxiety 03/11/2011  . History of neutropenia 03/11/2011  . Family history of colon cancer 03/11/2011  . Cough 11/15/2010  . Bronchiectasis without complication ([place]) 73/22/0254    Patient's Medications  New Prescriptions   AZITHROMYCIN (ZITHROMAX) 500 MG TABLET    Take 1 tablet (500 mg total) by mouth daily.    CLOFAZIMINE POWD    100 mg by Does not apply route every morning.   ETHAMBUTOL (MYAMBUTOL) 400 MG TABLET    Take 2 tablets (800 mg total) by mouth daily.  Previous Medications   ACYCLOVIR (ZOVIRAX) 400 MG TABLET    TAKE 1 TABLET BY MOUTH TWICE DAILY   ALBUTEROL (VENTOLIN HFA) 108 (90 BASE) MCG/ACT INHALER    Inhale 2 puffs into the lungs every 6 (six) hours as needed for wheezing or shortness of breath.   FOLIC ACID (FOLVITE) 1 MG TABLET    Take 1 tablet (1 mg total) by mouth daily.   GABAPENTIN (NEURONTIN) 300 MG CAPSULE    Take 1 capsule (300 mg total) by mouth at bedtime.   MULTIPLE VITAMIN (MULTIVITAMIN) CAPSULE    Take 1 capsule by mouth daily.   PANTOPRAZOLE (PROTONIX) 40 MG TABLET    TAKE 1 TABLET (40 MG TOTAL) BY MOUTH DAILY.   TAMOXIFEN (NOLVADEX) 20 MG TABLET    Take 20 mg by mouth at bedtime.   Modified Medications   No medications on file  Discontinued Medications   BENZONATATE (TESSALON PERLES) 100 MG CAPSULE    Take 1 capsule (100 mg total) by mouth 3 (three) times daily as needed for cough.   ONDANSETRON (ZOFRAN) 4 MG TABLET    Take 2 tablets (8 mg total) by mouth every 6 (six) hours as needed for nausea or vomiting.    Subjective: Anna Valentine is in for her routine follow-up visit.  She has a history of cavitary lung disease and Mycobacterium avium pneumonia.  She started  on azithromycin, ethambutol and rifampin last year.  After I changed her dosing to a daily regimen she developed severe nausea and vomiting leading to hospitalization.  Her symptoms resolved promptly after stopping her antibiotics.  Since that time she has been on azithromycin alone with no problems.  Over the last 6 weeks she has developed some worsening symptoms with increased cough productive of yellow sputum and slightly worse dyspnea on exertion.  Initially she had some fevers but they have now resolved spontaneously.  Repeat sputum culture on 06/26/2020 has grown Mycobacterium avium again.  Repeat chest CT scan  on 06/17/2020 showed:  IMPRESSION: 1. Redemonstrated postoperative findings of right lower lobectomy with severe bronchiectasis and cavitation of the remaining right lung. Cavitation has increased compared to prior examination dated 02/20/2020, with new, dense consolidation of the dependent right upper lobe. Findings are consistent with ongoing infection. 2. Redemonstrated consolidation and bronchial plugging in the lingula with scattered, clustered centrilobular nodules in the left lower lobe, likewise in keeping with atypical infection. 3. Small right pleural effusion, unchanged. 4. Coronary artery disease.    Review of Systems: Review of Systems  Constitutional: Negative for chills, diaphoresis, fever and weight loss.  Respiratory: Positive for cough, sputum production and shortness of breath. Negative for hemoptysis and wheezing.   Cardiovascular: Negative for chest pain.  Gastrointestinal: Negative for abdominal pain, diarrhea, nausea and vomiting.    Past Medical History:  Diagnosis Date  . Anemia   . BRCA negative 2011   BRCA I/ II negative  . Breast cancer ([place]) 08/2009   stage 2, rx with lumpectomy and xrt  . Bronchiectasis ([place])   . Chronic diastolic CHF (congestive heart failure) ([place]) 02/20/2020  . Constipation   . Essential hypertension 02/20/2020  . Family history of adverse reaction to anesthesia    My Sister has nausea  . Family history of breast cancer   . Family history of colon cancer   . Family history of lung cancer   . GERD (gastroesophageal reflux disease)    not presently having symptoms  . Hypertension   . Iatrogenic pneumothorax 02/06/2019  . Lung cancer ([place]) 06/06/05   stage 1 poorly differentiated adenocarcinoma, s/p right lower lobectomy.  . Osteopenia 09/13/2013  . Osteoporosis   . Personal history of lung cancer   . Pneumonia    2007ish, walking pnemonia - 2009 ish  . STD (sexually transmitted disease)    HSV  . Vitamin D deficiency 09/13/2013     Social History   Tobacco Use  . Smoking status: Former Smoker    Packs/day: 2.00    Years: 18.00    Pack years: 36.00    Quit date: 07/12/1987    Years since quitting: 33.0  . Smokeless tobacco: Never Used  Vaping Use  . Vaping Use: Never used  Substance Use Topics  . Alcohol use: Yes    Alcohol/week: 14.0 standard drinks    Types: 7 Glasses of wine, 7 Shots of liquor per week    Comment: social  . Drug use: No    Family History  Problem Relation Age of Onset  . Allergies Mother   . Asthma Mother   . Lung cancer Mother   . Breast cancer Mother 25       recurrence age 51  . Colon cancer Father 58  . Prostate cancer Brother 48  . Breast cancer Sister 89       Recurrence age 53 BRCA negative  . Colon polyps Sister   .  Leukemia Sister   . Breast cancer Sister 38  . Prostate cancer Brother 52  . Breast cancer Maternal Grandmother 24  . Colon cancer Maternal Aunt   . Leukemia Maternal Grandfather   . Lung cancer Maternal Aunt   . Breast cancer Cousin   . Esophageal cancer Neg Hx   . Rectal cancer Neg Hx   . Stomach cancer Neg Hx     Allergies  Allergen Reactions  . Taxol [Paclitaxel] Anaphylaxis  . Tape Itching  . Abraxane [Paclitaxel Protein-Bound Part] Rash  . Clarithromycin Rash    Objective: Vitals:   07/22/20 1111  BP: 111/73  Pulse: 93  Resp: 16  Temp: 97.6 F (36.4 C)  TempSrc: Oral  SpO2: 98%  Weight: 102 lb (46.3 kg)  Height: '5\' 6"'  (1.676 m)   Body mass index is 16.46 kg/m.  Physical Exam Constitutional:      Comments: Her spirits are good.  Her weight is unchanged.  Cardiovascular:     Rate and Rhythm: Normal rate and regular rhythm.  Pulmonary:     Effort: Pulmonary effort is normal.  Psychiatric:        Mood and Affect: Mood normal.      Problem List Items Addressed This Visit      High   Mycobacterium avium infection ([place])    I suspected that her recent clinical worsening and progression on chest CT are due to smoldering  Mycobacterium avium infection.  She did not tolerate her initial 3 drug regimen.  Her severe nausea and vomiting were almost certainly due to rifampin.  I have reviewed treatment options with her again today and recommended trying an alternative regimen including azithromycin, ethambutol and clofazimine.  She is in agreement with that plan.  I have discussed this plan with Magda Kiel, my ID pharmacy partner.  She is in agreement with the plan and will order clofazimine.  Once that is available she will have Ms. Thall come in for review of her new medications.  She will follow-up with me after that visit.      Relevant Medications   azithromycin (ZITHROMAX) 500 MG tablet   ethambutol (MYAMBUTOL) 400 MG tablet   Clofazimine POWD       Michel Bickers, MD [facility] for Infectious [place] [phone] pager   [phone] cell 07/22/2020, 1:49 PM

## 2020-07-22 NOTE — Assessment & Plan Note (Signed)
I suspected that her recent clinical worsening and progression on chest CT are due to smoldering Mycobacterium avium infection.  She did not tolerate her initial 3 drug regimen.  Her severe nausea and vomiting were almost certainly due to rifampin.  I have reviewed treatment options with her again today and recommended trying an alternative regimen including azithromycin, ethambutol and clofazimine.  She is in agreement with that plan.  I have discussed this plan with Anna Valentine, my ID pharmacy partner.  She is in agreement with the plan and will order clofazimine.  Once that is available she will have Anna Valentine come in for review of her new medications.  She will follow-up with me after that visit.

## 2020-07-23 ENCOUNTER — Telehealth: Payer: Self-pay | Admitting: Pharmacist

## 2020-07-23 NOTE — Telephone Encounter (Signed)
Patient has been approved for clofazimine. Will update encounter when I receive a shipping notice from [employer].

## 2020-07-23 NOTE — Telephone Encounter (Signed)
Completed online application for clofazimine with [employer] . Will update Dr. Megan Salon when approval status has been decided.

## 2020-07-29 ENCOUNTER — Other Ambulatory Visit: Payer: Medicare Other | Admitting: Internal Medicine

## 2020-07-29 ENCOUNTER — Telehealth: Payer: Self-pay | Admitting: Pharmacist

## 2020-07-29 NOTE — Telephone Encounter (Signed)
Patient's clofazimine arrived to clinic today. Called and spoke to patient. She will come on Tuesday 1/25 for pick up and counseling. I will make a 4-week follow up with Dr. Megan Salon for her at that appointment.

## 2020-08-04 ENCOUNTER — Other Ambulatory Visit: Payer: Self-pay

## 2020-08-04 ENCOUNTER — Telehealth (INDEPENDENT_AMBULATORY_CARE_PROVIDER_SITE_OTHER): Payer: Medicare Other | Admitting: Internal Medicine

## 2020-08-04 ENCOUNTER — Telehealth: Payer: Self-pay

## 2020-08-04 ENCOUNTER — Ambulatory Visit: Payer: Medicare Other | Admitting: Pharmacist

## 2020-08-04 DIAGNOSIS — A310 Pulmonary mycobacterial infection: Secondary | ICD-10-CM | POA: Diagnosis not present

## 2020-08-04 DIAGNOSIS — D2272 Melanocytic nevi of left lower limb, including hip: Secondary | ICD-10-CM | POA: Diagnosis not present

## 2020-08-04 DIAGNOSIS — L814 Other melanin hyperpigmentation: Secondary | ICD-10-CM | POA: Diagnosis not present

## 2020-08-04 DIAGNOSIS — L570 Actinic keratosis: Secondary | ICD-10-CM | POA: Diagnosis not present

## 2020-08-04 DIAGNOSIS — L821 Other seborrheic keratosis: Secondary | ICD-10-CM | POA: Diagnosis not present

## 2020-08-04 DIAGNOSIS — D225 Melanocytic nevi of trunk: Secondary | ICD-10-CM | POA: Diagnosis not present

## 2020-08-04 DIAGNOSIS — L820 Inflamed seborrheic keratosis: Secondary | ICD-10-CM | POA: Diagnosis not present

## 2020-08-04 DIAGNOSIS — L720 Epidermal cyst: Secondary | ICD-10-CM | POA: Diagnosis not present

## 2020-08-04 DIAGNOSIS — D2262 Melanocytic nevi of left upper limb, including shoulder: Secondary | ICD-10-CM | POA: Diagnosis not present

## 2020-08-04 NOTE — Telephone Encounter (Signed)
Error - duplicate

## 2020-08-04 NOTE — Telephone Encounter (Signed)
Please add her on to my schedule this week for a phone visit.  Thanks.

## 2020-08-04 NOTE — Progress Notes (Signed)
Virtual Visit via Telephone Note  I connected with Anna Valentine on 08/04/20 at  4:00 PM EST by telephone and verified that I am speaking with the correct person using two identifiers. Home Location: Patient: Home Provider: [facility]   I discussed the limitations, risks, security and privacy concerns of performing an evaluation and management service by telephone and the availability of in person appointments. I also discussed with the patient that there may be a patient responsible charge related to this service. The patient expressed understanding and agreed to proceed.   History of Present Illness: I called and spoke with Anna Valentine today.  She came by the clinic earlier today and picked up her clofazimine.  She also has her azithromycin and ethambutol and is ready to start on her new regimen for Mycobacterium avium pneumonia.  She tells me that she does not feel very good now.  She is still bothered by lots of coughing.  Sometimes she has a dry cough but it is frequently somewhat productive at night when she is trying to sleep.  She is not getting much relief from [employer]  She has some vague nausea.   Observations/Objective:   Assessment and Plan: She has smoldering Mycobacterium avium pneumonia with worsening clinical symptoms and radiographic findings.  She did not tolerate a regimen of azithromycin, ethambutol and rifampin last year.  She was hospitalized briefly with severe gastrointestinal upset.  Her symptoms resolved promptly after stopping her antibiotics.  She has taken azithromycin by itself since then and tolerated it well.  I suspect that her symptoms were due to rifampin.  I instructed her to start taking ethambutol now by itself.  If she tolerates it she will start azithromycin after 3 days.  Assuming she tolerates those 2 antibiotics together she will start clofazimine after 3 more days.  I instructed her to take all of her antibiotics with her morning meal.  She knows to  call me right away if she has any problems tolerating any of her new medications.  I have arranged a phone follow-up visit in 1 week.  Follow Up Instructions: Start ethambutol, azithromycin and clofazimine sequentially every 3 days. Follow-up in 1 week   I discussed the assessment and treatment plan with the patient. The patient was provided an opportunity to ask questions and all were answered. The patient agreed with the plan and demonstrated an understanding of the instructions.   The patient was advised to call back or seek an in-person evaluation if the symptoms worsen or if the condition fails to improve as anticipated.  I provided 15 minutes of non-face-to-face time during this encounter.   Michel Bickers, MD

## 2020-08-04 NOTE — Telephone Encounter (Signed)
Patient here today to pick up clofazimine obtained by our pharmacist.  She was to receive medication counseling but the pharmacist is not in the office.   I have given the patient the medication and told her to expect a call from Dr Megan Salon for medication counseling.  She was advised not to start the  Medication until she has spoken with Dr Megan Salon.   Dr Megan Salon pleases call patient for medication counseling.    Patient is available today anytime before 3 or after 4 pm.

## 2020-08-07 ENCOUNTER — Other Ambulatory Visit: Payer: Self-pay | Admitting: *Deleted

## 2020-08-07 DIAGNOSIS — Z170 Estrogen receptor positive status [ER+]: Secondary | ICD-10-CM

## 2020-08-09 NOTE — Progress Notes (Incomplete)
[place]  [phone] [phone]    ID: Anna Valentine DOB: 1950/10/19  MR#: [id number]  LYY#:[id number]  Patient Care Team: [name], MD as PCP - General (Internal Medicine) Magrinat, Virgie Dad, MD as Consulting Physician (Oncology) Collene Gobble, MD as Consulting Physician (Pulmonary Disease) [name], MD as Referring Physician Armbruster, Carlota Raspberry, MD as Consulting Physician (Gastroenterology) Kem Boroughs, [place] (Family Medicine) [name], MD as Consulting Physician (Radiation Oncology) Rolm Bookbinder, MD as Consulting Physician (General Surgery) Larey Dresser, MD as Consulting Physician (Cardiology) Bensimhon, Shaune Pascal, MD as Consulting Physician (Cardiology)   CHIEF COMPLAINT:  (1) Estrogen receptor positive left-sided breast cancer (2011)    (2) history of stage IA right lung adenocarcinoma resected November 2006    (3) triple- positive right-sided breast cancer    (4) homozygous p53 mutation/ Li-Fraumeni syndrome   CURRENT TREATMENT: Tamoxifen   INTERVAL HISTORY: Anna Valentine returns today for follow-up of her triple positive breast cancer and history of homozygous p53 mutation.     REVIEW  OF SYSTEMS: Anna Valentine    COVID 17 VACCINATION STATUS: [place] x2, most recently 09/2019   LUNG CANCER HISTORY:  From Dr. [name] original intake note 07/26/2005: (Lung cancer presentation)  "The patient is a very pleasant 70 year old female without a significant past medical history, who states that in 04/2005 she felt a lump in the left neck.  It did not resolve spontaneously, so she was seen by Dr. [name] [facility], and a CT scan of the neck was performed on 04/25/05.  There was noted to be two lymph nodes on the left, and they were normal in size, but they were asymmetrical.  No other pathologic findings were noted, except for left internal jugular vein, which was atypically positioned.  However, because of these  enlarged lymph nodes, a CT scan of the chest was also obtained by Dr. Renold Genta on 04/27/05, and the CT scan did reveal a poorly defined nodular opacity medially in the right upper lobe, measuring 6.5 mg anterior to posterior.  Within the right lower lobe there was noted to be a lobular nodular mass measuring 13 x 13 mm, and was felt to be worrisome for a primary lung carcinoma.  No other nodules or effusions were seen on the left lung.  On the mediastinal window images there was slight prominence of right hilar nodes, but no other evidence of mediastinal or hilar adenopathy.  A PET scan was thereafter obtained on 05/06/05.  The PET scan did reveal increased FDG activity within a small mass in the posterior superior right lower lobe, highly suspicious for a malignancy.  No significant nodal FDG uptake was identified within the hilar regions or the mediastinum.  Also noted was abnormal FDG uptake in the right middle lobe, corresponding to the CT scan findings, and again suspicious for a malignancy.  There was no evidence of metastatic disease within the neck, abdomen or pelvis.  The patient was then referred to [facility], and was seen by Dr. [name].  The patient underwent a right lower lobe wedge resection with completion lobectomy and mediastinal lymph node biopsies.  The pathology revealed the following:  Right upper lobe wedge resection revealed pulmonary tissue with necrotizing granulomatous inflammation.  No evidence of malignancy.  Right middle lobe wedge revealed again pulmonary tissue with necrotizing granulomatous inflammation.  No evidence of malignancy.  The right pleural lobe (lobectomy) revealed an adenocarcinoma, poorly differentiated, grade 3, measuring 1.2 cm.  The visceral pleura  was negative.  Chest wall negative.  Mediastinum negative.  All margins were negative, including the pleural and parenchymal margins.  There was no evidence of lymphovascular invasion.  In all lymph nodes sampled, including level  11, level 7, level 12, 4R and 2R lymph nodes negative for malignancy.  The patient was staged as T1 N0 M0, stage IA adenocarcinoma of the right lung.  Postoperatively the patient's course was complicated by the development of pleural effusion, requiring diuresis.  She had multiple x-rays performed.  Last x-ray performed on 07/07/05 revealed persistent right pleural effusion.  It was recommended that the patient have followups at [facility].  Dr. [name] Baxley kindly refers the patient to me today for medical oncology evaluation.  Clinically, the patient states that she has recovered well.  She is once again walking.  She was a runner, and she is trying to get back in shape."  LEFT BREAST CANCER HISTORY:From [name]'s 09/30/2009 note:  "She tells me that most recently she had her yearly screening mammogram performed that revealed an abnormality.  She also on exam was noted to have a palpable lump in the upper left breast as well.  Because of the abnormality, patient on September 02, 2009 had a digital diagnostic mammogram of the left breast and ultrasound of the left breast.  The mammogram revealed a 7.0 mm area slightly increased density in the upper left breast.  There were no suspicious calcifications noted.  The breast was heterogeneously dense.  Patient then went onto have an ultrasound of the breast performed and again a 7.0 mm irregular hypoechoic shadowing mass was noted in the 12 o'Valentine position of the left breast 5.0 cm from the nipple.  There were no enlarged or abnormal left axillary lymph nodes identified.  Patient went onto have a core biopsy performed on 09/02/2009 [phone]).  The needle core biopsy of the 12 o'Valentine mass revealed an invasive mammary carcinoma, low to intermediate grade.  It was lobular carcinoma.  Confirmatory immunohistochemical stains revealed the tumor to be strongly positive for cytokeratin AE1-AE3 with an infiltrative pattern of growth and the tumor was  negative for E-cadherin stain confirming a lobular phenotype.  The tumor was estrogen receptor positive at 98%, progesterone receptor positive 6%, proliferation marker Ki-67 by MIB was 14%.  The tumor did not express HER-2/neu by CISH with a signal of 1.04.  Patient was seen by Dr. Neldon Mc on 09/08/2009 for discussion of surgical options.  His recommendation was a lumpectomy.  Patient also had bilateral MRI of the breasts performed, which revealed a 0.8 cm mildly enlarged area of mass-like enhancement at the 12 o'Valentine location corresponding to the biopsy-proven breast cancer.  There was no evidence of lymphadenopathy."   PAST MEDICAL HISTORY: Past Medical History:  Diagnosis Date  . Anemia   . BRCA negative 2011   BRCA I/ II negative  . Breast cancer ([place]) 08/2009   stage 2, rx with lumpectomy and xrt  . Bronchiectasis ([place])   . Chronic diastolic CHF (congestive heart failure) ([place]) 02/20/2020  . Constipation   . Essential hypertension 02/20/2020  . Family history of adverse reaction to anesthesia    My Sister has nausea  . Family history of breast cancer   . Family history of colon cancer   . Family history of lung cancer   . GERD (gastroesophageal reflux disease)    not presently having symptoms  . Hypertension   . Iatrogenic pneumothorax 02/06/2019  . Lung cancer (  [place]) 06/06/05   stage 1 poorly differentiated adenocarcinoma, s/p right lower lobectomy.  . Osteopenia 09/13/2013  . Osteoporosis   . Personal history of lung cancer   . Pneumonia    2007ish, walking pnemonia - 2009 ish  . STD (sexually transmitted disease)    HSV  . Vitamin D deficiency 09/13/2013    PAST SURGICAL HISTORY: Past Surgical History:  Procedure Laterality Date  . BREAST BIOPSY    . BREAST LUMPECTOMY  10/12/2009   Left lumpectomy and radiation, stage II, ER/PR+, Her 2 nu negative  . BUNIONECTOMY Bilateral   . COLONOSCOPY    . COLONOSCOPY    . LOBECTOMY Right 06/06/2005   [place]  . MASTECTOMY W/  SENTINEL NODE BIOPSY Bilateral 03/15/2018  . MASTECTOMY W/ SENTINEL NODE BIOPSY Bilateral 03/15/2018   Procedure: BILATERAL TOTAL MASTECTOMIES WITH RIGHT SENTINEL LYMPH NODE BIOPSY;  Surgeon: Rolm Bookbinder, MD;  Location: [facility];  Service: General;  Laterality: Bilateral;  . PORTACATH PLACEMENT N/A 03/15/2018   Procedure: INSERTION PORT-A-CATH WITH [place];  Surgeon: Rolm Bookbinder, MD;  Location: [facility];  Service: General;  Laterality: N/A;  . TONSILLECTOMY    . TUBAL LIGATION  1984  . VIDEO BRONCHOSCOPY  02/06/2019   Flexible video fiberoptic bronchoscopy with electromagnetic navigation and biopsies.  Marland Kitchen VIDEO BRONCHOSCOPY WITH ENDOBRONCHIAL NAVIGATION Left 02/06/2019   Procedure: VIDEO BRONCHOSCOPY WITH ENDOBRONCHIAL NAVIGATION, left lung;  Surgeon: Collene Gobble, MD;  Location: [facility];  Service: Thoracic;  Laterality: Left;  Marland Kitchen VIDEO BRONCHOSCOPY WITH ENDOBRONCHIAL NAVIGATION N/A 01/08/2020   Procedure: VIDEO BRONCHOSCOPY WITH ENDOBRONCHIAL NAVIGATION;  Surgeon: Collene Gobble, MD;  Location[facility];  Service: Thoracic;  Laterality: N/A;    FAMILY HISTORY Family History  Problem Relation Age of Onset  . Allergies Mother   . Asthma Mother   . Lung cancer Mother   . Breast cancer Mother 14       recurrence age 14  . Colon cancer Father 35  . Prostate cancer Brother 47  . Breast cancer Sister 78       Recurrence age 31 BRCA negative  . Colon polyps Sister   . Leukemia Sister   . Breast cancer Sister 57  . Prostate cancer Brother 69  . Breast cancer Maternal Grandmother 2  . Colon cancer Maternal Aunt   . Leukemia Maternal Grandfather   . Lung cancer Maternal Aunt   . Breast cancer Cousin   . Esophageal cancer Neg Hx   . Rectal cancer Neg Hx   . Stomach cancer Neg Hx    The patient's maternal grandmother was diagnosed with cancer at age 52. Patient's mother was diagnosed at age 67. The patient's sister was diagnosed at age 3 with recurrence at 43 and is BRCA negative; she was  also diagnosed with leukemia and underwent a bone marrow transplant before passing in 11/2018. A second sister was diagnosed at age 73.   GYNECOLOGIC HISTORY:  No LMP recorded. Patient is postmenopausal. Menarche age 36, the patient is GX P0. She went through the change of life in approximately age 64. She did not take hormone replacement.   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 He passed away in summer of 2021 from a sudden heart attack. She is now home alone. The patient is a Tourist information centre manager.    ADVANCED DIRECTIVES: In place   HEALTH MAINTENANCE: Social History   Tobacco Use  . Smoking status: Former Smoker  Packs/day: 2.00    Years: 18.00    Pack years: 36.00    Quit date: 07/12/1987    Years since quitting: 33.1  . Smokeless tobacco: Never Used  Vaping Use  . Vaping Use: Never used  Substance Use Topics  . Alcohol use: Yes    Alcohol/week: 14.0 standard drinks    Types: 7 Glasses of wine, 7 Shots of liquor per week    Comment: social  . Drug use: No    Allergies  Allergen Reactions  . Taxol [Paclitaxel] Anaphylaxis  . Tape Itching  . Abraxane [Paclitaxel Protein-Bound Part] Rash  . Clarithromycin Rash    Current Outpatient Medications  Medication Sig Dispense Refill  . acyclovir (ZOVIRAX) 400 MG tablet TAKE 1 TABLET BY MOUTH TWICE DAILY 120 tablet 0  . albuterol (VENTOLIN HFA) 108 (90 Base) MCG/ACT inhaler Inhale 2 puffs into the lungs every 6 (six) hours as needed for wheezing or shortness of breath. 8 g prn  . azithromycin (ZITHROMAX) 500 MG tablet Take 1 tablet (500 mg total) by mouth daily. 30 tablet 11  . Clofazimine POWD 100 mg by Does not apply route every morning. 3000 g 11  . ethambutol (MYAMBUTOL) 400 MG tablet Take 2 tablets (800 mg total) by mouth daily. 60 tablet 11  . folic acid (FOLVITE) 1 MG tablet Take 1 tablet (1 mg total) by mouth daily. 100 tablet 6  . gabapentin (NEURONTIN) 300 MG  capsule Take 1 capsule (300 mg total) by mouth at bedtime. 90 capsule 4  . Multiple Vitamin (MULTIVITAMIN) capsule Take 1 capsule by mouth daily.    . pantoprazole (PROTONIX) 40 MG tablet TAKE 1 TABLET (40 MG TOTAL) BY MOUTH DAILY. 90 tablet 1  . tamoxifen (NOLVADEX) 20 MG tablet Take 20 mg by mouth at bedtime.      No current facility-administered medications for this visit.    OBJECTIVE:  white woman who appears moderately cachectic  There were no vitals filed for this visit.   There is no height or weight on file to calculate BMI.    ECOG FS:1 - Symptomatic but completely ambulatory  Sclerae unicteric, EOMs intact Wearing a mask No cervical or supraclavicular adenopathy Lungs no rales or rhonchi Heart regular rate and rhythm Abd soft, nontender, positive bowel sounds MSK no focal spinal tenderness, no upper extremity lymphedema Neuro: nonfocal, well oriented, appropriate affect Breasts:    {Sclerae unicteric, EOMs intact Wearing a mask No cervical or supraclavicular adenopathy Lungs no rales or rhonchi Heart regular rate and rhythm Abd soft, nontender, positive bowel sounds MSK no focal spinal tenderness, no upper extremity lymphedema Neuro: nonfocal, well oriented, appropriate affect Breasts: Status post bilateral mastectomies with no evidence of local recurrence}   LAB RESULTS:  CMP     Component Value Date/Time   NA 133 (L) 05/05/2020 1531   NA 139 12/22/2016 1141   K 3.8 05/05/2020 1531   K 4.2 12/22/2016 1141   CL 98 05/05/2020 1531   CL 103 11/22/2012 1025   CO2 26 05/05/2020 1531   CO2 28 12/22/2016 1141   GLUCOSE 116 (H) 05/05/2020 1531   GLUCOSE 92 12/22/2016 1141   GLUCOSE 94 11/22/2012 1025   BUN 15 05/05/2020 1531   BUN 13.2 12/22/2016 1141   CREATININE 0.54 05/05/2020 1531   CREATININE 0.7 12/22/2016 1141   CALCIUM 8.9 05/05/2020 1531   CALCIUM 9.7 12/22/2016 1141   PROT 6.8 05/05/2020 1531   PROT 7.0 12/22/2016 1141   ALBUMIN 2.9 (L)  03/09/2020 1118   ALBUMIN 3.8 12/22/2016 1141   AST 25 05/05/2020 1531   AST 46 (H) 03/09/2020 1118   AST 18 12/22/2016 1141   ALT 31 (H) 05/05/2020 1531   ALT 59 (H) 03/09/2020 1118   ALT 10 12/22/2016 1141   ALKPHOS 112 03/09/2020 1118   ALKPHOS 81 12/22/2016 1141   BILITOT 0.3 05/05/2020 1531   BILITOT 0.4 03/09/2020 1118   BILITOT 0.65 12/22/2016 1141   GFRNONAA 93 04/16/2020 1225   GFRAA 108 04/16/2020 1225    INo results found for: SPEP, UPEP  Lab Results  Component Value Date   WBC 7.4 05/29/2020   NEUTROABS 5,668 05/29/2020   HGB 10.1 (L) 05/29/2020   HCT 30.5 (L) 05/29/2020   MCV 91.3 05/29/2020   PLT 373 05/29/2020      Chemistry      Component Value Date/Time   NA 133 (L) 05/05/2020 1531   NA 139 12/22/2016 1141   K 3.8 05/05/2020 1531   K 4.2 12/22/2016 1141   CL 98 05/05/2020 1531   CL 103 11/22/2012 1025   CO2 26 05/05/2020 1531   CO2 28 12/22/2016 1141   BUN 15 05/05/2020 1531   BUN 13.2 12/22/2016 1141   CREATININE 0.54 05/05/2020 1531   CREATININE 0.7 12/22/2016 1141      Component Value Date/Time   CALCIUM 8.9 05/05/2020 1531   CALCIUM 9.7 12/22/2016 1141   ALKPHOS 112 03/09/2020 1118   ALKPHOS 81 12/22/2016 1141   AST 25 05/05/2020 1531   AST 46 (H) 03/09/2020 1118   AST 18 12/22/2016 1141   ALT 31 (H) 05/05/2020 1531   ALT 59 (H) 03/09/2020 1118   ALT 10 12/22/2016 1141   BILITOT 0.3 05/05/2020 1531   BILITOT 0.4 03/09/2020 1118   BILITOT 0.65 12/22/2016 1141       Lab Results  Component Value Date   LABCA2 18 09/30/2009    No components found for: [id number]  No results for input(s): INR in the last 168 hours.  Urinalysis    Component Value Date/Time   COLORURINE AMBER (A) 02/21/2020 0456   APPEARANCEUR CLEAR 02/21/2020 0456   LABSPEC 1.030 02/21/2020 0456   PHURINE 6.0 02/21/2020 0456   GLUCOSEU NEGATIVE 02/21/2020 0456   HGBUR NEGATIVE 02/21/2020 0456   BILIRUBINUR NEGATIVE 02/21/2020 0456   BILIRUBINUR NEG  12/17/2019 1654   KETONESUR 20 (A) 02/21/2020 0456   PROTEINUR NEGATIVE 02/21/2020 0456   UROBILINOGEN 0.2 12/17/2019 1654   NITRITE NEGATIVE 02/21/2020 0456   LEUKOCYTESUR NEGATIVE 02/21/2020 0456    STUDIES: No results found.   ASSESSMENT: 70 y.o. [place], [place] woman status post right upper lobe wedge resection, middle lobe wedge resection, lower lobectomy and mediastinal lymph node dissection 06/06/2005 for a 1.2 cm grade 3 adenocarcinoma, pT1 pN1, stage Ia  (a) [facility] at [facility] with every other year chest CT, most recently 06/06/2017  LEFT BREAST CANCER: (1) status post left breast upper outer quadrant biopsy 09/02/2009 for an invasive lobular carcinoma (E-cadherin negative) estrogen receptor 98% positive, progesterone receptor 6% positive, with an MIB-1 of 14% and no HER-2 amplification. [[id number]]  (2) status post left lumpectomy and sentinel lymph node sampling 10/12/2009 for a pT1b pN1, stage IIA invasive lobular breast cancer, with negative margins.  (3) Oncotype DX score of 16 predicts a risk of outside the breast recurrence of 10% if the patient's only systemic treatment is tamoxifen for 5 years. It also predicts no benefit from adjuvant chemotherapy  (4)  completed adjuvant radiation therapy 01/28/2010, receiving 5040 cGy to the left breast, with a boost to the upper inner aspect of the breast (to a cumulative dose of 6300 cGy); the axillary and supraclavicular regions received 4500 cGy  (5) started anastrozole July 2011, completedI 5 years July 2016  RIGHT BREAST CANCER: (6) status post right breast upper outer quadrant biopsy 01/29/2018 for a clinical  T1c N0, stage IA invasive ductal carcinoma, grade 2, estrogen receptor positive, progesterone receptor negative, HER-2 amplified, with an MIB-1 of 15%  (7) status post bilateral mastectomy with right sentinel lymph node sampling 03/15/2018, showing  (a) on the left, no malignancy noted  (b) on the right, a pT1c pN0,stage  IA invasive ductal carcinoma, grade 2, with negative margins.  (c) a total of 6 lymph nodes removed from the right axilla, none from the left  (8) chemotherapy consisting of paclitaxel weekly x12 started 04/26/2018, with trastuzumab to be given for 1 year  (a) myocardial perfusion study 12/20/2017 showed an ejection fraction of 75% (hyperdynamic  (b) paclitaxel switched to Abraxane with the dose #2 because of initial reaction to Taxol  (c) Abraxane discontinued 07/13/2018 with continuing side effects  (d) received a total of 10 paclitaxel last Abraxane doses of 12 planned  (9) adjuvant radiation not indicated  (10) tamoxifen started 08/01/2018, on hold after August 2020 because of spotting  (11) bone density 02/21/2012 at [place] showed osteoporosis with a T score of -2.5; on alendronate  (12) repeat genetics testing 02/08/2018 offered through [facility] Common Hereditary Cancers Panel and 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 There were no deleterious mutations in: APC, ATM, AXIN2, BARD1, BMPR1A, BRCA1, BRCA2, BRIP1, CDH1, CDKN2A (p14ARF), CDKN2A (p16INK4a), CKD4, CHEK2, CTNNA1, DICER1, EPCAM (Deletion/duplication testing only), GREM1 (promoter region deletion/duplication testing only), KIT, MEN1, MLH1, MSH2, MSH3, MSH6, MUTYH, NBN, NF1, NHTL1, PALB2, PDGFRA, PMS2, POLD1, POLE, PTEN, RAD50, RAD51C, RAD51D, SDHB, SDHC, SDHD, SMAD4, SMARCA4. STK11, TSC1, TSC2, and VHL.  The following genes were evaluated for sequence changes only: SDHA and HOXB13 c.251G>A variant only.  (a) see "cancer surveillance" for details of long term Maylon Peppers related screening studies   (i) s/p bilateral mastectomies   (ii) colonoscopy recommended every 2 to 5 years: Next scheduled September 2020   (iii) brain MRI every year: However patient has significant claustrophobia   (iiii) consider referral to a Li-Fraumeni syndrome clinic   PLAN: Anna Valentine is struggling with her MAC, and she has lost  a lot of weight, is very weak, and is short of breath.  I am hopeful will be triple antibiotics (azithromycin, rifampin, and ethambutol) can pull her out of this hole and she does tell me her sputum at least appears to be clearing somewhat.  I am starting her on folate daily given the ethambutol and also on mirtazapine at 7.5 mg at bedtime which I think will help her sleep and help her appetite.  It may also help with the fact that she is adjusting to the sudden loss of her husband so recently.  I also wrote her for Compazine in case the Zofran is not sufficient.  Also I gabapentin at bedtime for the nighttime hot flashes.  We discussed diet issues and I encouraged her to of course be a good amount of protein but also increase her carbohydrate intake (potatoes, pastel, rice, bread).  Hopefully she can begin to work on increasing her weight over the next 2 to 3 months.  She is seeing Dr. Lamonte Sakai on  a monthly basis.  She is going to see me in 6 months but she can call us if any problems develop before then  Total encounter time 30 minutes.Sarajane Jews C. Magrinat, MD 08/09/20 12:44 PM Medical Oncology and Hematology [facility] [place], [place] [address] Tel. [phone]    Fax. [phone]    I, Wilburn Mylar, am acting as scribe for Dr. Virgie Dad. Magrinat.  I, Lurline Del MD, have reviewed the above documentation for accuracy and completeness, and I agree with the above.   *Total Encounter Time as defined by the Centers for Medicare and Medicaid Services includes, in addition to the face-to-face time of a patient visit (documented in the note above) non-face-to-face time: obtaining and reviewing outside history, ordering and reviewing medications, tests or procedures, care coordination (communications with other health care professionals or caregivers) and documentation in the medical record.

## 2020-08-10 ENCOUNTER — Telehealth: Payer: Self-pay | Admitting: *Deleted

## 2020-08-10 ENCOUNTER — Inpatient Hospital Stay: Payer: Medicare Other | Admitting: Oncology

## 2020-08-10 ENCOUNTER — Inpatient Hospital Stay: Payer: Medicare Other

## 2020-08-10 NOTE — Telephone Encounter (Signed)
This RN spoke with pt her return call.  She states she is having a exacerbation of her known mycobactrium avium pneumonia and is on antibiotics under the care of Dr Megan Salon.  Per phone discussion appt today was canceled per her prior call - she is scheduled for next visit 09/22/2020 and states she feels she does not have any needs at this time that she needs to be seen before then.  No further concerns at this time.

## 2020-08-10 NOTE — Telephone Encounter (Signed)
Anna Valentine

## 2020-08-11 ENCOUNTER — Other Ambulatory Visit: Payer: Self-pay

## 2020-08-11 ENCOUNTER — Telehealth (INDEPENDENT_AMBULATORY_CARE_PROVIDER_SITE_OTHER): Payer: Medicare Other | Admitting: Internal Medicine

## 2020-08-11 DIAGNOSIS — A310 Pulmonary mycobacterial infection: Secondary | ICD-10-CM | POA: Diagnosis not present

## 2020-08-11 NOTE — Progress Notes (Signed)
Virtual Visit via Telephone Note  I connected with Anna Valentine on 08/11/20 at  3:45 PM EST by telephone and verified that I am speaking with the correct person using two identifiers.  Location: Patient: Home Provider: [facility]   I discussed the limitations, risks, security and privacy concerns of performing an evaluation and management service by telephone and the availability of in person appointments. I also discussed with the patient that there may be a patient responsible charge related to this service. The patient expressed understanding and agreed to proceed.   History of Present Illness: I called and spoke with Anna Valentine today.  She started back on her new regimen for her Mycobacterium avium pneumonia 1 week ago.  She started ethambutol first and tolerated that for the first 3 days before adding azithromycin.  She did not develop any recurrent nausea and took her first dose of clofazimine today.  She has not had any nausea today.  However she says that she has not been feeling well recently but is not feeling any worse since starting on antibiotics 1 week ago.  She continues to be bothered by cough, malaise, and some evening fevers for which she takes ibuprofen.   Observations/Objective:   Assessment and Plan: It seems as though she is tolerating her new 3 drug antibiotic regimen.  I am hopeful that she will start to feel better as this regimen starts to offer some relief from her chronic, smoldering Mycobacterium avium pneumonia.  Follow Up Instructions: Continue ethambutol, azithromycin and clofazimine Follow-up in 6 weeks   I discussed the assessment and treatment plan with the patient. The patient was provided an opportunity to ask questions and all were answered. The patient agreed with the plan and demonstrated an understanding of the instructions.   The patient was advised to call back or seek an in-person evaluation if the symptoms worsen or if the condition fails to  improve as anticipated.  I provided 15 minutes of non-face-to-face time during this encounter.   Michel Bickers, MD

## 2020-08-19 ENCOUNTER — Other Ambulatory Visit (INDEPENDENT_AMBULATORY_CARE_PROVIDER_SITE_OTHER): Payer: Medicare Other | Admitting: Internal Medicine

## 2020-08-19 ENCOUNTER — Other Ambulatory Visit: Payer: Self-pay

## 2020-08-19 DIAGNOSIS — D72829 Elevated white blood cell count, unspecified: Secondary | ICD-10-CM | POA: Diagnosis not present

## 2020-08-19 DIAGNOSIS — R0602 Shortness of breath: Secondary | ICD-10-CM | POA: Diagnosis not present

## 2020-08-19 DIAGNOSIS — D508 Other iron deficiency anemias: Secondary | ICD-10-CM

## 2020-08-19 DIAGNOSIS — R54 Age-related physical debility: Secondary | ICD-10-CM | POA: Diagnosis not present

## 2020-08-19 DIAGNOSIS — D649 Anemia, unspecified: Secondary | ICD-10-CM | POA: Diagnosis not present

## 2020-08-19 DIAGNOSIS — E611 Iron deficiency: Secondary | ICD-10-CM | POA: Diagnosis not present

## 2020-08-19 NOTE — Addendum Note (Signed)
Addended by: Mady Haagensen on: 08/19/2020 02:19 PM   Modules accepted: Orders

## 2020-08-20 LAB — CBC WITH DIFFERENTIAL/PLATELET
Absolute Monocytes: 683 cells/uL (ref 200–950)
Basophils Absolute: 24 cells/uL (ref 0–200)
Basophils Relative: 0.2 %
Eosinophils Absolute: 85 cells/uL (ref 15–500)
Eosinophils Relative: 0.7 %
HCT: 32.9 % — ABNORMAL LOW (ref 35.0–45.0)
Hemoglobin: 10.7 g/dL — ABNORMAL LOW (ref 11.7–15.5)
Lymphs Abs: 830 cells/uL — ABNORMAL LOW (ref 850–3900)
MCH: 28.6 pg (ref 27.0–33.0)
MCHC: 32.5 g/dL (ref 32.0–36.0)
MCV: 88 fL (ref 80.0–100.0)
MPV: 8.9 fL (ref 7.5–12.5)
Monocytes Relative: 5.6 %
Neutro Abs: 10577 cells/uL — ABNORMAL HIGH (ref 1500–7800)
Neutrophils Relative %: 86.7 %
Platelets: 509 10*3/uL — ABNORMAL HIGH (ref 140–400)
RBC: 3.74 10*6/uL — ABNORMAL LOW (ref 3.80–5.10)
RDW: 13 % (ref 11.0–15.0)
Total Lymphocyte: 6.8 %
WBC: 12.2 10*3/uL — ABNORMAL HIGH (ref 3.8–10.8)

## 2020-08-20 LAB — VITAMIN B12: Vitamin B-12: 268 pg/mL (ref 200–1100)

## 2020-08-20 LAB — IRON,TIBC AND FERRITIN PANEL
%SAT: 14 % (calc) — ABNORMAL LOW (ref 16–45)
Ferritin: 1488 ng/mL — ABNORMAL HIGH (ref 16–288)
Iron: 31 ug/dL — ABNORMAL LOW (ref 45–160)
TIBC: 220 mcg/dL (calc) — ABNORMAL LOW (ref 250–450)

## 2020-08-20 LAB — FOLATE: Folate: 13.9 ng/mL

## 2020-08-21 ENCOUNTER — Encounter: Payer: Self-pay | Admitting: Internal Medicine

## 2020-08-21 ENCOUNTER — Telehealth: Payer: Self-pay | Admitting: Internal Medicine

## 2020-08-21 NOTE — Telephone Encounter (Signed)
Many thanks. 

## 2020-08-21 NOTE — Telephone Encounter (Signed)
Phone call to patient. Her WBC count is elevated. No fever.? 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 Iron level os low again. Question for Dr. Jana Hakim is should she have another iron infusion?  She is also have some vague dyspepsia and nausea. Has not had colonoscopy yet. Not sure if she needs endoscopy as well. Will refer back to Dr. Havery Moros.

## 2020-08-21 NOTE — Telephone Encounter (Signed)
Thank you for the note. Anemia looks like that of chronic disease with low TIBC but defer to Dr. Jana Hakim, her ferritin is also > 1000.  She is due for colonoscopy and we can add on upper endoscopy for her upper tract symptoms in light of the anemia. [place] can you contact the patient to see if she wishes to book for direct EGD and colon at the [facility] or if she would like to see me in the clinic first to discuss options. Thanks

## 2020-08-23 LAB — M. AVIUM MIC PANEL
AMIKACIN: 64
CIPROFLOXACIN: >16
CLARITHROMYCIN: 8
ETHAMBUTOL: 8
ETHIONAMIDE: >20
ISONIAZID: 8
LINEZOLID: 64
MOXIFLOXACIN: 8
RIFABUTIN: 1
RIFAMPIN: 8
STREPTOMYCIN: >64

## 2020-08-23 LAB — MYCOBACTERIA,CULT W/FLUOROCHROME SMEAR
MICRO NUMBER:: 11336331
SPECIMEN QUALITY:: ADEQUATE

## 2020-08-23 LAB — RESPIRATORY CULTURE OR RESPIRATORY AND SPUTUM CULTURE
MICRO NUMBER:: 11336333
RESULT:: NORMAL
SPECIMEN QUALITY:: ADEQUATE

## 2020-08-24 NOTE — Telephone Encounter (Signed)
Agree this is not iron deficiency and is c/w anemia of inflammation. From our point of view would follow.  Thanks  GM

## 2020-08-24 NOTE — Telephone Encounter (Signed)
Patient advised to stop iron, patient verbalized understanding.

## 2020-08-24 NOTE — Telephone Encounter (Signed)
Please call Anna Valentine. Dr. Jana Hakim does not think she needs more iron at this time.

## 2020-08-24 NOTE — Telephone Encounter (Signed)
Spoke with patient, she states that she would like to discuss further with Dr. Havery Moros before scheduling procedures. Patient has been scheduled for a follow up with Dr. Havery Moros on Wednesday, 08/26/20 at 9:20 AM. Patient wanted to cancel 10/06/20 appt. Patient verbalized understanding of all information and had no concerns at the end of the call.

## 2020-08-24 NOTE — Telephone Encounter (Signed)
Thank you Brooklyn 

## 2020-08-26 ENCOUNTER — Ambulatory Visit (INDEPENDENT_AMBULATORY_CARE_PROVIDER_SITE_OTHER): Payer: Medicare Other | Admitting: Gastroenterology

## 2020-08-26 ENCOUNTER — Encounter: Payer: Self-pay | Admitting: Gastroenterology

## 2020-08-26 VITALS — BP 111/64 | HR 99 | Ht 66.0 in | Wt 96.8 lb

## 2020-08-26 DIAGNOSIS — Z1502 Genetic susceptibility to malignant neoplasm of ovary: Secondary | ICD-10-CM

## 2020-08-26 DIAGNOSIS — Z1501 Genetic susceptibility to malignant neoplasm of breast: Secondary | ICD-10-CM | POA: Diagnosis not present

## 2020-08-26 DIAGNOSIS — R0600 Dyspnea, unspecified: Secondary | ICD-10-CM | POA: Diagnosis not present

## 2020-08-26 DIAGNOSIS — R110 Nausea: Secondary | ICD-10-CM | POA: Diagnosis not present

## 2020-08-26 DIAGNOSIS — Z1509 Genetic susceptibility to other malignant neoplasm: Secondary | ICD-10-CM | POA: Diagnosis not present

## 2020-08-26 DIAGNOSIS — D649 Anemia, unspecified: Secondary | ICD-10-CM

## 2020-08-26 NOTE — Progress Notes (Signed)
HPI :  70 year old female here for follow-up visit for TP53 mutation / Li-Fraumeni syndrome to discuss timing of next surveillance colonoscopy, and also to address chronic nausea.  She is accompanied by a friend of hers today.   She is a history of lung cancer, history of left breast cancer in 2011, history of right breast cancer in July 2019. She has previously tested positive for theTP53 gene, which can be associated with Li-Fraumeni syndrome.Genetics recommended consideration for colonoscopy every 2-5 years starting at age 44.   Her last colonoscopy was performed in 2018 which was normal without any polyps.  She has never had any prior colon polyps on her colonoscopy exams as below.    She denies any problems with her bowels at this time.  No blood in her stools.  Her most pressing concern is symptoms of persistent nausea which has been present since January timeframe.  She has a history of MAC which was treated with antibiotics this past summer.  She states she had a rough period of a few weeks where she felt poorly, was admitted to the hospital for 4 days for hyponatremia and dehydration.  She was eventually taken off the medicines and felt okay.  She is now had recurrent MAC and currently on azithromycin, ethambutol and clofazimine, this started in recent weeks.  She states however her nausea started earlier in January well before she started antibiotics.  She is feeling nauseous at baseline and is present mostly all the time.  She does not vomit but has occasional dry heaves with some coughing.  She is eating but has a poor appetite and forces herself to eat so she does not lose weight.  She has lost perhaps a few pounds over the past month or 2.  She does not really have any pains in her abdomen that she endorses.  Eating does not make her have pain.  She denies any reflux symptoms.  No dysphagia.  He has never had a prior endoscopy.  She does endorse history of taking Advil for fevers and  night sweats, 2 tabs per day which she was on for a few months but actually stopped that in January.  Stopping this has made no improvement to her nausea.  She has been taking Protonix 40 mg once daily since last summer but she is unaware why she needs to take this.  She has not taken anything for her nausea yet but has some Zofran at home someone prescribed for her, she inquires if she should take this.  She otherwise has had a PET scan and a CT scan of her chest as outlined below.  She states over the past 2 months she has had worsening dyspnea.  She feels quite short of breath after climbing a flight of stairs needs to rest.  She can feel some tightness in her chest with this.  She denies any lower extremity edema.  She is not on any supplemental oxygen.  Her last echo was August 2020 and showed an EF of 55%.  She is a bit anxious about receiving anesthesia without having her lungs further evaluated.  She denies any problems with anesthesia historically.  Most recent labs show that her hemoglobin is 10.7, with an MCV of 88.  Her vitamin B12 level is 268, folate normal.  Her ferritin is 1488, iron 31, TIBC 2020 with an iron sat of 14%.  Se denies any family history of gastric or pancreatic cancer. She's never had a prior upper  endoscopy. Her father had colon cancer diagnosed in his 23s.  Prior colonoscopies: Colonoscopy 03/20/2017 - small cecal AVM, diverticulosis, no polyps Colonoscopy 09/10/2010 - normal Colonoscopy 07/30/2003 - normal   RUQ [place] 8.13.21 -  IMPRESSION: No sonographic finding to explain the patient's symptoms. Small amount of sludge in the gallbladder.  PET scan 12/31/19 - IMPRESSION: 1. Intense metabolic activity associated with the RIGHT upper lobe consolidation is highly concerning for lung cancer recurrence. Pulmonary infection could have a similar pattern but less favored. Recommend clinical correlation. 2. Additional pleural nodules and smaller foci of consolidation  in the lower RIGHT lung also with intense metabolic activity. 3. Cavitary lesion in the superior aspect of the RIGHT lung with moderate metabolic activity again concerning for lung cancer recurrence. Pulmonary infection such as fungal infection would also be considered. 4. No hypermetabolic mediastinal lymph nodes. 5. Focus of bronchiectasis and nodularity in the lingula with differential include infection versus metastatic disease.      Past Medical History:  Diagnosis Date  . Anemia   . BRCA negative 2011   BRCA I/ II negative  . Breast cancer ([place]) 08/2009   stage 2, rx with lumpectomy and xrt  . Bronchiectasis ([place])   . Chronic diastolic CHF (congestive heart failure) ([place]) 02/20/2020  . Constipation   . Essential hypertension 02/20/2020  . Family history of adverse reaction to anesthesia    My Sister has nausea  . Family history of breast cancer   . Family history of colon cancer   . Family history of lung cancer   . GERD (gastroesophageal reflux disease)    not presently having symptoms  . Hypertension   . Iatrogenic pneumothorax 02/06/2019  . Lung cancer ([place]) 06/06/05   stage 1 poorly differentiated adenocarcinoma, s/p right lower lobectomy.  . Osteopenia 09/13/2013  . Osteoporosis   . Personal history of lung cancer   . Pneumonia    2007ish, walking pnemonia - 2009 ish  . STD (sexually transmitted disease)    HSV  . Vitamin D deficiency 09/13/2013     Past Surgical History:  Procedure Laterality Date  . BREAST BIOPSY    . BREAST LUMPECTOMY  10/12/2009   Left lumpectomy and radiation, stage II, ER/PR+, Her 2 nu negative  . BUNIONECTOMY Bilateral   . COLONOSCOPY    . COLONOSCOPY    . LOBECTOMY Right 06/06/2005   [place]  . MASTECTOMY W/ SENTINEL NODE BIOPSY Bilateral 03/15/2018  . MASTECTOMY W/ SENTINEL NODE BIOPSY Bilateral 03/15/2018   Procedure: BILATERAL TOTAL MASTECTOMIES WITH RIGHT SENTINEL LYMPH NODE BIOPSY;  Surgeon: Rolm Bookbinder, MD;  Location: [place];  Service: General;  Laterality: Bilateral;  . PORTACATH PLACEMENT N/A 03/15/2018   Procedure: INSERTION PORT-A-CATH WITH [place];  Surgeon: Rolm Bookbinder, MD;  Location: [place];  Service: General;  Laterality: N/A;  . TONSILLECTOMY    . TUBAL LIGATION  1984  . VIDEO BRONCHOSCOPY  02/06/2019   Flexible video fiberoptic bronchoscopy with electromagnetic navigation and biopsies.  Marland Kitchen VIDEO BRONCHOSCOPY WITH ENDOBRONCHIAL NAVIGATION Left 02/06/2019   Procedure: VIDEO BRONCHOSCOPY WITH ENDOBRONCHIAL NAVIGATION, left lung;  Surgeon: Collene Gobble, MD;  Location: [place];  Service: Thoracic;  Laterality: Left;  Marland Kitchen VIDEO BRONCHOSCOPY WITH ENDOBRONCHIAL NAVIGATION N/A 01/08/2020   Procedure: VIDEO BRONCHOSCOPY WITH ENDOBRONCHIAL NAVIGATION;  Surgeon: Collene Gobble, MD;  Location: [facility];  Service: Thoracic;  Laterality: N/A;   Family History  Problem Relation Age of Onset  . Allergies Mother   . Asthma Mother   .  Lung cancer Mother   . Breast cancer Mother 75       recurrence age 43  . Colon cancer Father 62  . Prostate cancer Brother 58  . Breast cancer Sister 49       Recurrence age 70 BRCA negative  . Colon polyps Sister   . Leukemia Sister   . Breast cancer Sister 21  . Prostate cancer Brother 3  . Breast cancer Maternal Grandmother 76  . Colon cancer Maternal Aunt   . Leukemia Maternal Grandfather   . Lung cancer Maternal Aunt   . Breast cancer Cousin   . Esophageal cancer Neg Hx   . Rectal cancer Neg Hx   . Stomach cancer Neg Hx    Social History   Tobacco Use  . Smoking status: Former Smoker    Packs/day: 2.00    Years: 18.00    Pack years: 36.00    Quit date: 07/12/1987    Years since quitting: 33.1  . Smokeless tobacco: Never Used  Vaping Use  . Vaping Use: Never used  Substance Use Topics  . Alcohol use: Yes    Alcohol/week: 14.0 standard drinks    Types: 7 Glasses of wine, 7 Shots of liquor per week    Comment: social  . Drug use: No   Current Outpatient  Medications  Medication Sig Dispense Refill  . acyclovir (ZOVIRAX) 400 MG tablet TAKE 1 TABLET BY MOUTH TWICE DAILY 120 tablet 0  . albuterol (VENTOLIN HFA) 108 (90 Base) MCG/ACT inhaler Inhale 2 puffs into the lungs every 6 (six) hours as needed for wheezing or shortness of breath. 8 g prn  . azithromycin (ZITHROMAX) 500 MG tablet Take 1 tablet (500 mg total) by mouth daily. 30 tablet 11  . Clofazimine POWD 100 mg by Does not apply route every morning. 3000 g 11  . ethambutol (MYAMBUTOL) 400 MG tablet Take 2 tablets (800 mg total) by mouth daily. 60 tablet 11  . Multiple Vitamin (MULTIVITAMIN) capsule Take 1 capsule by mouth daily.    . ondansetron (ZOFRAN) 4 MG tablet Take 4 mg by mouth every 8 (eight) hours as needed for nausea or vomiting.    . pantoprazole (PROTONIX) 40 MG tablet TAKE 1 TABLET (40 MG TOTAL) BY MOUTH DAILY. 90 tablet 1  . tamoxifen (NOLVADEX) 20 MG tablet Take 20 mg by mouth at bedtime.      No current facility-administered medications for this visit.   Allergies  Allergen Reactions  . Taxol [Paclitaxel] Anaphylaxis  . Tape Itching  . Abraxane [Paclitaxel Protein-Bound Part] Rash  . Clarithromycin Rash     Review of Systems: All systems reviewed and negative except where noted in HPI.   Lab Results  Component Value Date   WBC 12.2 (H) 08/19/2020   HGB 10.7 (L) 08/19/2020   HCT 32.9 (L) 08/19/2020   MCV 88.0 08/19/2020   PLT 509 (H) 08/19/2020    Lab Results  Component Value Date   IRON 31 (L) 08/19/2020   TIBC 220 (L) 08/19/2020   FERRITIN 1,488 (H) 08/19/2020    Lab Results  Component Value Date   CREATININE 0.54 05/05/2020   BUN 15 05/05/2020   NA 133 (L) 05/05/2020   K 3.8 05/05/2020   CL 98 05/05/2020   CO2 26 05/05/2020    Lab Results  Component Value Date   ALT 31 (H) 05/05/2020   AST 25 05/05/2020   ALKPHOS 112 03/09/2020   BILITOT 0.3 05/05/2020  Physical Exam: BP 111/64   Pulse 99   Ht _0  (1.676 m)   Wt 96 lb 12.8 oz  (43.9 kg)   SpO2 99%   BMI 15.62 kg/m  Constitutional: Pleasant,well-developed, female in no acute distress. HEENT: Normocephalic and atraumatic. Conjunctivae are normal. No scleral icterus. Neck supple.  Cardiovascular: Normal rate, regular rhythm.  Pulmonary/chest: Effort normal and breath sounds normal. Abdominal: Soft, thin, nondistended, nontender.  There are no masses palpable.. Extremities: no edema Lymphadenopathy: No cervical adenopathy noted. Neurological: Alert and oriented to person place and time. Skin: Skin is warm and dry. No rashes noted. Psychiatric: Normal mood and affect. Behavior is normal.   ASSESSMENT AND PLAN: 70 year old female here for reassessment of the following issues:  Persistent nausea TP53 gene mutation Anemia Dyspnea  As above, patient with significant persistent nausea with occasional dry heaves over the past 2 months without clear precipitants.  It does not limit her eating but she does have a poor appetite.  No pain at all.  She has been on Protonix since last summer for unclear reasons but this has not helped her at all.  Clearly she is on a strong antibiotic regiment which can cause GI upset however her symptoms have started before taking these antibiotics.  I discussed differential diagnosis with her.  She is due for a colonoscopy for screening purposes given her TP53 gene mutation and she does want to proceed with that.  I offered her an upper endoscopy to be done at the same time given she is never had that and to screen her upper tract in light of her nausea.  Following discussion of risks and benefits of the procedures and anesthesia she does want to proceed.  However, she does have some dyspnea that has worsened in the past month or 2.  I am not sure if this is due to her underlying MAC / bronchiectasis, but we discussed this for a bit and we feel it best that she be evaluated to ensure she is fit for anesthesia before we proceed with this.  I will  reach out to her primary care and pulmonologist to see what other work-up if any needs to be performed.  She had an echocardiogram in 2020 which looked okay, unclear if that needs to be repeated or if she needs her oxygen saturations tested when she feels like this.  Once she has that addressed and is determined stable for anesthesia we will proceed with EGD and colonoscopy.  In the interim we discussed options to treat nausea.  I recommend standing Zofran 4 mg 3 times daily to start and see if that provides benefit.  If it does not work or she has not tolerated she should let me know and we will consider other options.  We otherwise did discuss long-term Protonix use, indications for that, also long-term risks.  She does have osteopenia and and is at increased risk for fracture being on this, will await EGD and if no high risk pathology we may stop it.  Of note, iron studies consistent with that of chronic disease, perhaps due to her bronchiectasis. Hemoglobin is stable and will monitor. She agreed  Plan: - will reach out to her primary care and pulmonologist and see what other workup may be recommended prior to proceeding with endoscopic evaluation, in light of her recent dyspnea - EGD and colonoscopy once cleared as above. She will contact me once she is ready to schedule. - start Zofran three times daily for  nausea - discussed protonix use, may stop it pending EGD results  Pueblitos Cellar, MD [facility]

## 2020-08-26 NOTE — Patient Instructions (Signed)
If you are age 69 or older, your body mass index should be between 23-30. Your Body mass index is 15.62 kg/m. If this is out of the aforementioned range listed, please consider follow up with your Primary Care Provider.  If you are age 21 or younger, your body mass index should be between 19-25. Your Body mass index is 15.62 kg/m. If this is out of the aformentioned range listed, please consider follow up with your Primary Care Provider.   It has been recommended to you by your physician that you have a(n) EGD/Colonoscopy completed. Per your request, we did not schedule the procedure(s) today. Please contact our office at [phone] when you are ready to have the procedure completed. You will be scheduled for a pre-visit and procedure at that time.  Thank you for entrusting me with your care and for choosing [facility], Dr. Juntura Cellar

## 2020-08-31 ENCOUNTER — Telehealth: Payer: Self-pay | Admitting: Internal Medicine

## 2020-08-31 NOTE — Telephone Encounter (Signed)
Anna Valentine [phone]  [name] called to say she seen Dr Havery Moros last week and he was going to send you a message about her getting a CT of the Heart before he could do an Endoscopy on her. Have you seen anything on this?

## 2020-08-31 NOTE — Telephone Encounter (Signed)
Called patient and she is going to call Pulmonary about Echcardiogram

## 2020-08-31 NOTE — Telephone Encounter (Signed)
The note says he will be reaching out to me and Pulmonary about work up prior to anesthesia with GI procedure. I see he has thought about an Echocardiogram. I think she should contact Pulmonary about this.It has to do with whether or not she can tolerate anesthesia.

## 2020-09-07 ENCOUNTER — Other Ambulatory Visit: Payer: Self-pay

## 2020-09-07 MED ORDER — PROMETHAZINE HCL 12.5 MG PO TABS: 12.5000 mg | ORAL_TABLET | Freq: Three times a day (TID) | ORAL | 0 refills | Status: DC | PRN

## 2020-09-07 NOTE — Telephone Encounter (Signed)
Please advise on patient mychart message  I have been experiencing consistent nausea since mid January (a couple of weeks before starting the antibiotics). Met with Dr Havery Moros about it. He would like to do a endoscopy and colonoscopy but suggested I reach out to you to see if you might have any recommendations for any work ups prior to proceeding. Also experiencing more shortness of breath

## 2020-09-09 ENCOUNTER — Telehealth: Payer: Self-pay

## 2020-09-09 NOTE — Telephone Encounter (Signed)
The nausea could relate to the infection itself, now with the superimposed contribution of multiple antibiotics.   I agree with getting the procedures that Dr Havery Moros recommends to look for other possible contributors.   Dr Jacinto Reap

## 2020-09-09 NOTE — Telephone Encounter (Signed)
PA APPROVED 08-10-20 - 09-09-2021. [employer]: [phone], PATIENT ID: [id number]

## 2020-09-09 NOTE — Telephone Encounter (Signed)
PA for Promethazine 12.5 mg tablets submitted via CoverMyMeds. Key: [id number] - PA Case ID: [id number].   BIN: [id number], PCN: [id number],  ID: [id number]

## 2020-09-10 ENCOUNTER — Other Ambulatory Visit: Payer: Self-pay | Admitting: Internal Medicine

## 2020-09-14 ENCOUNTER — Encounter: Payer: Self-pay | Admitting: Gastroenterology

## 2020-09-16 NOTE — Telephone Encounter (Signed)
Dr. Lamonte Sakai, Please see patient notes and advise.  Thank you.

## 2020-09-16 NOTE — Telephone Encounter (Signed)
I agree that I need to see her sooner.   Please put her into one of my next blocked slots. If none in the next couple weeks then set her up to see APP in the interim

## 2020-09-18 ENCOUNTER — Telehealth: Payer: Self-pay | Admitting: Internal Medicine

## 2020-09-18 ENCOUNTER — Other Ambulatory Visit: Payer: Self-pay

## 2020-09-18 ENCOUNTER — Encounter: Payer: Self-pay | Admitting: Internal Medicine

## 2020-09-18 ENCOUNTER — Ambulatory Visit (INDEPENDENT_AMBULATORY_CARE_PROVIDER_SITE_OTHER): Payer: Medicare Other | Admitting: Internal Medicine

## 2020-09-18 VITALS — BP 100/60 | HR 72 | Temp 99.9°F | Ht 66.0 in | Wt 96.0 lb

## 2020-09-18 DIAGNOSIS — H6503 Acute serous otitis media, bilateral: Secondary | ICD-10-CM | POA: Diagnosis not present

## 2020-09-18 MED ORDER — METHYLPREDNISOLONE ACETATE 80 MG/ML IJ SUSP
80.0000 mg | Freq: Once | INTRAMUSCULAR | Status: AC
  Administered 2020-09-18: 80 mg via INTRAMUSCULAR

## 2020-09-18 MED ORDER — CEFTRIAXONE SODIUM 1 G IJ SOLR
1.0000 g | Freq: Once | INTRAMUSCULAR | Status: AC
  Administered 2020-09-18: 1 g via INTRAMUSCULAR

## 2020-09-18 NOTE — Telephone Encounter (Signed)
COVID test negative.

## 2020-09-18 NOTE — Telephone Encounter (Signed)
Scheduled OV.

## 2020-09-18 NOTE — Telephone Encounter (Signed)
Take Home Covid test and book OV if negative

## 2020-09-18 NOTE — Telephone Encounter (Signed)
Anna Valentine called to say she thinks her ears are stopped up, she is having trouble hearing out of them especially the left ear. It comes and goes.

## 2020-09-18 NOTE — Telephone Encounter (Signed)
Called patient she is going to do home COVID test and then call back with results, if negative book OV for today

## 2020-09-21 ENCOUNTER — Other Ambulatory Visit: Payer: Self-pay | Admitting: Gastroenterology

## 2020-09-21 NOTE — Progress Notes (Signed)
[facility]  [phone] [phone]    ID: NGINA Valentine DOB: 1950-09-15  MR#: [id number]  YSA#:[id number]  Patient Care Team: [name], MD as PCP - General (Internal Medicine) Dre Gamino, Virgie Dad, MD as Consulting Physician (Oncology) Collene Gobble, MD as Consulting Physician (Pulmonary Disease) [name], MD as Referring Physician Armbruster, Carlota Raspberry, MD as Consulting Physician (Gastroenterology) Kem Boroughs, Harrod (Family Medicine) [name], MD as Consulting Physician (Radiation Oncology) Rolm Bookbinder, MD as Consulting Physician (General Surgery) Larey Dresser, MD as Consulting Physician (Cardiology) Haroldine Laws Shaune Pascal, MD as Consulting Physician (Cardiology)   CHIEF COMPLAINT:  (1) Estrogen receptor positive left-sided breast cancer (2011)    (2) history of stage IA right lung adenocarcinoma resected November 2006    (3) triple- positive right-sided breast cancer Sept 2019    (4) homozygous p53 mutation/ Li-Fraumeni syndrome   CURRENT TREATMENT: Tamoxifen   INTERVAL HISTORY: Ludell returns today for follow-up of her triple positive breast cancer and history of homozygous p53 mutation.  Since her last visit, she underwent chest CT on 06/16/2020 showing: redemonstrated severe bronchiectasis and cavitation of remaining right lung; cavitation increased since 02/2020, with new, dense consolidation of right upper lobe; findings consisted with ongoing infection.   In January she has been started on triple antibiotics.  She had problems with rifampin and is currently on clofazimine, ethambutol and Zithromax  REVIEW  OF SYSTEMS: [name] tells me her cough is better.  She still has problems with nausea.  She says certain sense of taste and appetite are "okay".  She just does not want to eat very much.  She is not having many little meals but trying to have 3 meals a day.  Bowel movements are fine.  Occasionally she has  mild dysuria but this is not persistent.  She denies any unusual headaches.  She has had no visual changes.  She denies recent falls.  She is having problems with her hearing and she thinks she may need to go to see an ENT--she is working on this with Dr. Renold Genta.  Aside from these issues a detailed review of systems today was stable   COVID 19 VACCINATION STATUS: [place] x2, most recently 09/2019   LUNG CANCER HISTORY:  From Dr. [name] original intake note 07/26/2005: (Lung cancer presentation)  "The patient is a very pleasant 70 year old female without a significant past medical history, who states that in 04/2005 she felt a lump in the left neck.  It did not resolve spontaneously, so she was seen by Dr. [name] [facility], and a CT scan of the neck was performed on 04/25/05.  There was noted to be two lymph nodes on the left, and they were normal in size, but they were asymmetrical.  No other pathologic findings were noted, except for left internal jugular vein, which was atypically positioned.  However, because of these enlarged lymph nodes, a CT scan of the chest was also obtained by Dr. Renold Genta on 04/27/05, and the CT scan did reveal a poorly defined nodular opacity medially in the right upper lobe, measuring 6.5 mg anterior to posterior.  Within the right lower lobe there was noted to be a lobular nodular mass measuring 13 x 13 mm, and was felt to be worrisome for a primary lung carcinoma.  No other nodules or effusions were seen on the left lung.  On the mediastinal window images there was slight prominence of right hilar nodes, but no other evidence of  mediastinal or hilar adenopathy.  A PET scan was thereafter obtained on 05/06/05.  The PET scan did reveal increased FDG activity within a small mass in the posterior superior right lower lobe, highly suspicious for a malignancy.  No significant nodal FDG uptake was identified within the hilar regions or the mediastinum.  Also noted was abnormal FDG  uptake in the right middle lobe, corresponding to the CT scan findings, and again suspicious for a malignancy.  There was no evidence of metastatic disease within the neck, abdomen or pelvis.  The patient was then referred to [facility], and was seen by Dr. [name].  The patient underwent a right lower lobe wedge resection with completion lobectomy and mediastinal lymph node biopsies.  The pathology revealed the following:  Right upper lobe wedge resection revealed pulmonary tissue with necrotizing granulomatous inflammation.  No evidence of malignancy.  Right middle lobe wedge revealed again pulmonary tissue with necrotizing granulomatous inflammation.  No evidence of malignancy.  The right pleural lobe (lobectomy) revealed an adenocarcinoma, poorly differentiated, grade 3, measuring 1.2 cm.  The visceral pleura was negative.  Chest wall negative.  Mediastinum negative.  All margins were negative, including the pleural and parenchymal margins.  There was no evidence of lymphovascular invasion.  In all lymph nodes sampled, including level 11, level 7, level 12, 4R and 2R lymph nodes negative for malignancy.  The patient was staged as T1 N0 M0, stage IA adenocarcinoma of the right lung.  Postoperatively the patient's course was complicated by the development of pleural effusion, requiring diuresis.  She had multiple x-rays performed.  Last x-ray performed on 07/07/05 revealed persistent right pleural effusion.  It was recommended that the patient have followups at [facility].  Dr. [name] Baxley kindly refers the patient to me today for medical oncology evaluation.  Clinically, the patient states that she has recovered well.  She is once again walking.  She was a runner, and she is trying to get back in shape."  LEFT BREAST CANCER HISTORY:From [name]'s 09/30/2009 note:  "She tells me that most recently she had her yearly screening mammogram performed that revealed an abnormality.  She also on exam was  noted to have a palpable lump in the upper left breast as well.  Because of the abnormality, patient on September 02, 2009 had a digital diagnostic mammogram of the left breast and ultrasound of the left breast.  The mammogram revealed a 7.0 mm area slightly increased density in the upper left breast.  There were no suspicious calcifications noted.  The breast was heterogeneously dense.  Patient then went onto have an ultrasound of the breast performed and again a 7.0 mm irregular hypoechoic shadowing mass was noted in the 12 o'clock position of the left breast 5.0 cm from the nipple.  There were no enlarged or abnormal left axillary lymph nodes identified.  Patient went onto have a core biopsy performed on 09/02/2009 [phone]).  The needle core biopsy of the 12 o'clock mass revealed an invasive mammary carcinoma, low to intermediate grade.  It was lobular carcinoma.  Confirmatory immunohistochemical stains revealed the tumor to be strongly positive for cytokeratin AE1-AE3 with an infiltrative pattern of growth and the tumor was negative for E-cadherin stain confirming a lobular phenotype.  The tumor was estrogen receptor positive at 98%, progesterone receptor positive 6%, proliferation marker Ki-67 by MIB was 14%.  The tumor did not express HER-2/neu by CISH with a signal of 1.04.  Patient was seen by Dr.  Neldon Mc on 09/08/2009 for discussion of surgical options.  His recommendation was a lumpectomy.  Patient also had bilateral MRI of the breasts performed, which revealed a 0.8 cm mildly enlarged area of mass-like enhancement at the 12 o'clock location corresponding to the biopsy-proven breast cancer.  There was no evidence of lymphadenopathy."   PAST MEDICAL HISTORY: Past Medical History:  Diagnosis Date  . Anemia   . BRCA negative 2011   BRCA I/ II negative  . Breast cancer ([place]) 08/2009   stage 2, rx with lumpectomy and xrt  . Bronchiectasis ([place])   . Chronic diastolic CHF (congestive heart  failure) ([place]) 02/20/2020  . Constipation   . Essential hypertension 02/20/2020  . Family history of adverse reaction to anesthesia    My Sister has nausea  . Family history of breast cancer   . Family history of colon cancer   . Family history of lung cancer   . GERD (gastroesophageal reflux disease)    not presently having symptoms  . Hypertension   . Iatrogenic pneumothorax 02/06/2019  . Lung cancer ([place]) 06/06/05   stage 1 poorly differentiated adenocarcinoma, s/p right lower lobectomy.  . Osteopenia 09/13/2013  . Osteoporosis   . Personal history of lung cancer   . Pneumonia    2007ish, walking pnemonia - 2009 ish  . STD (sexually transmitted disease)    HSV  . Vitamin D deficiency 09/13/2013    PAST SURGICAL HISTORY: Past Surgical History:  Procedure Laterality Date  . BREAST BIOPSY    . BREAST LUMPECTOMY  10/12/2009   Left lumpectomy and radiation, stage II, ER/PR+, Her 2 nu negative  . BUNIONECTOMY Bilateral   . COLONOSCOPY    . COLONOSCOPY    . LOBECTOMY Right 06/06/2005   [place]  . MASTECTOMY W/ SENTINEL NODE BIOPSY Bilateral 03/15/2018  . MASTECTOMY W/ SENTINEL NODE BIOPSY Bilateral 03/15/2018   Procedure: BILATERAL TOTAL MASTECTOMIES WITH RIGHT SENTINEL LYMPH NODE BIOPSY;  Surgeon: Rolm Bookbinder, MD;  Location: [facility] [facility];  Service: General;  Laterality: Bilateral;  . PORTACATH PLACEMENT N/A 03/15/2018   Procedure: INSERTION PORT-A-CATH WITH [place];  Surgeon: Rolm Bookbinder, MD;  Location: [facility];  Service: General;  Laterality: N/A;  . TONSILLECTOMY    . TUBAL LIGATION  1984  . VIDEO BRONCHOSCOPY  02/06/2019   Flexible video fiberoptic bronchoscopy with electromagnetic navigation and biopsies.  Marland Kitchen VIDEO BRONCHOSCOPY WITH ENDOBRONCHIAL NAVIGATION Left 02/06/2019   Procedure: VIDEO BRONCHOSCOPY WITH ENDOBRONCHIAL NAVIGATION, left lung;  Surgeon: Collene Gobble, MD;  Location: [facility];  Service: Thoracic;  Laterality: Left;  Marland Kitchen VIDEO BRONCHOSCOPY WITH ENDOBRONCHIAL NAVIGATION  N/A 01/08/2020   Procedure: VIDEO BRONCHOSCOPY WITH ENDOBRONCHIAL NAVIGATION;  Surgeon: Collene Gobble, MD;  Location: [facility];  Service: Thoracic;  Laterality: N/A;    FAMILY HISTORY Family History  Problem Relation Age of Onset  . Allergies Mother   . Asthma Mother   . Lung cancer Mother   . Breast cancer Mother 74       recurrence age 8  . Colon cancer Father 18  . Prostate cancer Brother 73  . Breast cancer Sister 81       Recurrence age 59 BRCA negative  . Colon polyps Sister   . Leukemia Sister   . Breast cancer Sister 26  . Prostate cancer Brother 53  . Breast cancer Maternal Grandmother 41  . Colon cancer Maternal Aunt   . Leukemia Maternal Grandfather   . Lung cancer Maternal Aunt   . Breast  cancer Cousin   . Esophageal cancer Neg Hx   . Rectal cancer Neg Hx   . Stomach cancer Neg Hx   The patient's maternal grandmother was diagnosed with cancer at age 31. Patient's mother was diagnosed at age 64. The patient's sister was diagnosed at age 8 with recurrence at 67 and is BRCA negative; she was also diagnosed with leukemia and underwent a bone marrow transplant before passing in 11/2018. A second sister was diagnosed at age 2.   GYNECOLOGIC HISTORY:  No LMP recorded. Patient is postmenopausal. Menarche age 48, the patient is GX P0. She went through the change of life in approximately age 48. She did not take hormone replacement.   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 She was the primary caregiver for her husband, who has had a couple of small strokes and has myelodysplasia, until he passed in 02/2020. She lives alone now. She helps a friend out with her business. She walks her 52 year old lab. The patient is a Tourist information centre manager    ADVANCED DIRECTIVES: In place   HEALTH MAINTENANCE: Social History   Tobacco Use  . Smoking status: Former Smoker    Packs/day: 2.00    Years: 18.00    Pack years: 36.00     Quit date: 07/12/1987    Years since quitting: 33.2  . Smokeless tobacco: Never Used  Vaping Use  . Vaping Use: Never used  Substance Use Topics  . Alcohol use: Yes    Alcohol/week: 14.0 standard drinks    Types: 7 Glasses of wine, 7 Shots of liquor per week    Comment: social  . Drug use: No    Allergies  Allergen Reactions  . Taxol [Paclitaxel] Anaphylaxis  . Tape Itching  . Abraxane [Paclitaxel Protein-Bound Part] Rash  . Clarithromycin Rash    Current Outpatient Medications  Medication Sig Dispense Refill  . acyclovir (ZOVIRAX) 400 MG tablet TAKE 1 TABLET BY MOUTH TWICE DAILY 120 tablet 0  . albuterol (VENTOLIN HFA) 108 (90 Base) MCG/ACT inhaler Inhale 2 puffs into the lungs every 6 (six) hours as needed for wheezing or shortness of breath. 8 g prn  . azithromycin (ZITHROMAX) 250 MG tablet  6 each 0  . Clofazimine POWD 100 mg by Does not apply route every morning. 3000 g 11  . ethambutol (MYAMBUTOL) 400 MG tablet Take 2 tablets (800 mg total) by mouth daily. 60 tablet 11  . Multiple Vitamin (MULTIVITAMIN) capsule Take 1 capsule by mouth daily.    . ondansetron (ZOFRAN) 4 MG tablet Take 4 mg by mouth every 8 (eight) hours as needed for nausea or vomiting.    . pantoprazole (PROTONIX) 40 MG tablet TAKE 1 TABLET (40 MG TOTAL) BY MOUTH DAILY. 90 tablet 1  . promethazine (PHENERGAN) 12.5 MG tablet Take 1 tablet (12.5 mg total) by mouth every 8 (eight) hours as needed. 30 tablet 1  . tamoxifen (NOLVADEX) 20 MG tablet Take 1 tablet (20 mg total) by mouth at bedtime. 90 tablet 4   No current facility-administered medications for this visit.    OBJECTIVE:  white woman who appears mildly cachectic  Vitals:   09/22/20 1333  BP: 116/70  Pulse: 89  Resp: 17  Temp: 97.7 F (36.5 C)  SpO2: 100%     Body mass index is 15.28 kg/m.    ECOG FS:1 - Symptomatic but completely ambulatory Filed Weights   09/22/20 1333  Weight: 94 lb 11.2  oz (43 kg)    Sclerae unicteric, EOMs  intact Wearing a mask No cervical or supraclavicular adenopathy Lungs no rales or rhonchi Heart regular rate and rhythm Abd soft, nontender, positive bowel sounds MSK no focal spinal tenderness, no upper extremity lymphedema Neuro: nonfocal, well oriented, appropriate affect Breasts: Status post bilateral mastectomies.  There is no evidence of chest wall recurrence.   LAB RESULTS:  CMP     Component Value Date/Time   NA 133 (L) 05/05/2020 1531   NA 139 12/22/2016 1141   K 3.8 05/05/2020 1531   K 4.2 12/22/2016 1141   CL 98 05/05/2020 1531   CL 103 11/22/2012 1025   CO2 26 05/05/2020 1531   CO2 28 12/22/2016 1141   GLUCOSE 116 (H) 05/05/2020 1531   GLUCOSE 92 12/22/2016 1141   GLUCOSE 94 11/22/2012 1025   BUN 15 05/05/2020 1531   BUN 13.2 12/22/2016 1141   CREATININE 0.54 05/05/2020 1531   CREATININE 0.7 12/22/2016 1141   CALCIUM 8.9 05/05/2020 1531   CALCIUM 9.7 12/22/2016 1141   PROT 6.8 05/05/2020 1531   PROT 7.0 12/22/2016 1141   ALBUMIN 2.9 (L) 03/09/2020 1118   ALBUMIN 3.8 12/22/2016 1141   AST 25 05/05/2020 1531   AST 46 (H) 03/09/2020 1118   AST 18 12/22/2016 1141   ALT 31 (H) 05/05/2020 1531   ALT 59 (H) 03/09/2020 1118   ALT 10 12/22/2016 1141   ALKPHOS 112 03/09/2020 1118   ALKPHOS 81 12/22/2016 1141   BILITOT 0.3 05/05/2020 1531   BILITOT 0.4 03/09/2020 1118   BILITOT 0.65 12/22/2016 1141   GFRNONAA 93 04/16/2020 1225   GFRAA 108 04/16/2020 1225    INo results found for: SPEP, UPEP  Lab Results  Component Value Date   WBC 7.9 09/22/2020   NEUTROABS 6.3 09/22/2020   HGB 11.0 (L) 09/22/2020   HCT 35.0 (L) 09/22/2020   MCV 91.4 09/22/2020   PLT 402 (H) 09/22/2020      Chemistry      Component Value Date/Time   NA 133 (L) 05/05/2020 1531   NA 139 12/22/2016 1141   K 3.8 05/05/2020 1531   K 4.2 12/22/2016 1141   CL 98 05/05/2020 1531   CL 103 11/22/2012 1025   CO2 26 05/05/2020 1531   CO2 28 12/22/2016 1141   BUN 15 05/05/2020 1531    BUN 13.2 12/22/2016 1141   CREATININE 0.54 05/05/2020 1531   CREATININE 0.7 12/22/2016 1141      Component Value Date/Time   CALCIUM 8.9 05/05/2020 1531   CALCIUM 9.7 12/22/2016 1141   ALKPHOS 112 03/09/2020 1118   ALKPHOS 81 12/22/2016 1141   AST 25 05/05/2020 1531   AST 46 (H) 03/09/2020 1118   AST 18 12/22/2016 1141   ALT 31 (H) 05/05/2020 1531   ALT 59 (H) 03/09/2020 1118   ALT 10 12/22/2016 1141   BILITOT 0.3 05/05/2020 1531   BILITOT 0.4 03/09/2020 1118   BILITOT 0.65 12/22/2016 1141       Lab Results  Component Value Date   LABCA2 18 09/30/2009    No components found for: [id number]  No results for input(s): INR in the last 168 hours.  Urinalysis    Component Value Date/Time   COLORURINE AMBER (A) 02/21/2020 0456   APPEARANCEUR CLEAR 02/21/2020 0456   LABSPEC 1.030 02/21/2020 0456   PHURINE 6.0 02/21/2020 [place] 02/21/2020 0456   HGBUR NEGATIVE 02/21/2020 0456   BILIRUBINUR NEGATIVE 02/21/2020 0456  BILIRUBINUR NEG 12/17/2019 1654   KETONESUR 20 (A) 02/21/2020 0456   PROTEINUR NEGATIVE 02/21/2020 0456   UROBILINOGEN 0.2 12/17/2019 1654   NITRITE NEGATIVE 02/21/2020 0456   LEUKOCYTESUR NEGATIVE 02/21/2020 0456    STUDIES: No results found.   ASSESSMENT: 70 y.o. [place], [place] woman status post right upper lobe wedge resection, middle lobe wedge resection, lower lobectomy and mediastinal lymph node dissection 06/06/2005 for a 1.2 cm grade 3 adenocarcinoma, pT1 pN1, stage Ia  (a) [facility] with every other year chest CT, most recently 06/06/2017  LEFT BREAST CANCER: (1) status post left breast upper outer quadrant biopsy 09/02/2009 for an invasive lobular carcinoma (E-cadherin negative) estrogen receptor 98% positive, progesterone receptor 6% positive, with an MIB-1 of 14% and no HER-2 amplification. [[id number]]  (2) status post left lumpectomy and sentinel lymph node sampling 10/12/2009 for a pT1b pN1, stage IIA invasive lobular  breast cancer, with negative margins.  (3) Oncotype DX score of 16 predicts a risk of outside the breast recurrence of 10% if the patient's only systemic treatment is tamoxifen for 5 years. It also predicts no benefit from adjuvant chemotherapy  (4) completed adjuvant radiation therapy 01/28/2010, receiving 5040 cGy to the left breast, with a boost to the upper inner aspect of the breast (to a cumulative dose of 6300 cGy); the axillary and supraclavicular regions received 4500 cGy  (5) started anastrozole July 2011, completedI 5 years July 2016  RIGHT BREAST CANCER: (6) status post right breast upper outer quadrant biopsy 01/29/2018 for a clinical  T1c N0, stage IA invasive ductal carcinoma, grade 2, estrogen receptor positive, progesterone receptor negative, HER-2 amplified, with an MIB-1 of 15%  (7) status post bilateral mastectomy with right sentinel lymph node sampling 03/15/2018, showing  (a) on the left, no malignancy noted  (b) on the right, a pT1c pN0,stage IA invasive ductal carcinoma, grade 2, with negative margins.  (c) a total of 6 lymph nodes removed from the right axilla, none from the left  (8) chemotherapy consisting of paclitaxel weekly x12 started 04/26/2018, with trastuzumab to be given for 1 year  (a) myocardial perfusion study 12/20/2017 showed an ejection fraction of 75% (hyperdynamic  (b) paclitaxel switched to Abraxane with the dose #2 because of initial reaction to Taxol  (c) Abraxane discontinued 07/13/2018 with continuing side effects  (d) received a total of 10 paclitaxel last Abraxane doses of 12 planned  (9) adjuvant radiation not indicated  (10) tamoxifen started 08/01/2018  (11) bone density 02/21/2012 at [facility] showed osteoporosis with a T score of -2.5; on alendronate  (12) repeat genetics testing 02/08/2018 offered through [facility] Common Hereditary Cancers Panel and 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 There were no  deleterious mutations in: APC, ATM, AXIN2, BARD1, BMPR1A, BRCA1, BRCA2, BRIP1, CDH1, CDKN2A (p14ARF), CDKN2A (p16INK4a), CKD4, CHEK2, CTNNA1, DICER1, EPCAM (Deletion/duplication testing only), GREM1 (promoter region deletion/duplication testing only), KIT, MEN1, MLH1, MSH2, MSH3, MSH6, MUTYH, NBN, NF1, NHTL1, PALB2, PDGFRA, PMS2, POLD1, POLE, PTEN, RAD50, RAD51C, RAD51D, SDHB, SDHC, SDHD, SMAD4, SMARCA4. STK11, TSC1, TSC2, and VHL.  The following genes were evaluated for sequence changes only: SDHA and HOXB13 c.251G>A variant only.  (a) see "cancer surveillance" for details of long term Maylon Peppers related screening studies   (i) s/p bilateral mastectomies   (ii) colonoscopy recommended every 2 to 5 years: Next scheduled May 2022   (iii) brain MRI every year: However patient has significant claustrophobia   (iiii) consider referral to a Li-Fraumeni syndrome  clinic   PLAN: Alaysiah is tolerating her triple antibiotics for MAC moderately well.  I do not know if the nausea is related to this but she tells me it really did start previously.  She is scheduled for EGD and colonoscopy in May and perhaps that will give [place] additional information.  In the meantime she tells me Phenergan helps  She is being very closely followed by Dr. Lamonte Sakai and Dr. Megan Salon and she tells me finally her cough is abating.  She is still significantly short of breath.  She stopped the Remeron although she does not really remember why.  When she stopped she found it very difficult to go to sleep and right now she is very reluctant to go back on it even though she understands that could help her appetite as well as sleeping at night.  She also went off the gabapentin which she does not think was really helping very much.  She is continuing tamoxifen with good tolerance and the plan is to stay on that for a total of 5 years  At this point are involvement in her care is marginal.  She will see me again in October.  She knows to call  for any other issue that may develop before that visit  Total encounter time 25 minutes.Sarajane Jews C. Jadavion Spoelstra, MD 09/22/20 1:55 PM Medical Oncology and Hematology [facility] [place], [place] [address] Tel. [phone]    Fax. [phone]    I, Wilburn Mylar, am acting as scribe for Dr. Virgie Dad. Arihanna Estabrook.  I, Lurline Del MD, have reviewed the above documentation for accuracy and completeness, and I agree with the above.   *Total Encounter Time as defined by the Centers for Medicare and Medicaid Services includes, in addition to the face-to-face time of a patient visit (documented in the note above) non-face-to-face time: obtaining and reviewing outside history, ordering and reviewing medications, tests or procedures, care coordination (communications with other health care professionals or caregivers) and documentation in the medical record.

## 2020-09-22 ENCOUNTER — Inpatient Hospital Stay: Payer: Medicare Other | Attending: Oncology

## 2020-09-22 ENCOUNTER — Other Ambulatory Visit: Payer: Self-pay

## 2020-09-22 ENCOUNTER — Inpatient Hospital Stay (HOSPITAL_BASED_OUTPATIENT_CLINIC_OR_DEPARTMENT_OTHER): Payer: Medicare Other | Admitting: Oncology

## 2020-09-22 ENCOUNTER — Inpatient Hospital Stay: Payer: Medicare Other

## 2020-09-22 ENCOUNTER — Ambulatory Visit (INDEPENDENT_AMBULATORY_CARE_PROVIDER_SITE_OTHER): Payer: Medicare Other | Admitting: Internal Medicine

## 2020-09-22 VITALS — BP 116/70 | HR 89 | Temp 97.7°F | Resp 17 | Ht 66.0 in | Wt 94.7 lb

## 2020-09-22 DIAGNOSIS — Z87891 Personal history of nicotine dependence: Secondary | ICD-10-CM | POA: Diagnosis not present

## 2020-09-22 DIAGNOSIS — Z803 Family history of malignant neoplasm of breast: Secondary | ICD-10-CM | POA: Diagnosis not present

## 2020-09-22 DIAGNOSIS — Z806 Family history of leukemia: Secondary | ICD-10-CM | POA: Insufficient documentation

## 2020-09-22 DIAGNOSIS — C50411 Malignant neoplasm of upper-outer quadrant of right female breast: Secondary | ICD-10-CM | POA: Diagnosis not present

## 2020-09-22 DIAGNOSIS — Z800 Family history of malignant neoplasm of digestive organs: Secondary | ICD-10-CM | POA: Diagnosis not present

## 2020-09-22 DIAGNOSIS — Z923 Personal history of irradiation: Secondary | ICD-10-CM | POA: Diagnosis not present

## 2020-09-22 DIAGNOSIS — C50212 Malignant neoplasm of upper-inner quadrant of left female breast: Secondary | ICD-10-CM

## 2020-09-22 DIAGNOSIS — Z801 Family history of malignant neoplasm of trachea, bronchus and lung: Secondary | ICD-10-CM | POA: Diagnosis not present

## 2020-09-22 DIAGNOSIS — R634 Abnormal weight loss: Secondary | ICD-10-CM | POA: Diagnosis not present

## 2020-09-22 DIAGNOSIS — I5032 Chronic diastolic (congestive) heart failure: Secondary | ICD-10-CM | POA: Diagnosis not present

## 2020-09-22 DIAGNOSIS — Z9013 Acquired absence of bilateral breasts and nipples: Secondary | ICD-10-CM | POA: Diagnosis not present

## 2020-09-22 DIAGNOSIS — Z1509 Genetic susceptibility to other malignant neoplasm: Secondary | ICD-10-CM | POA: Diagnosis not present

## 2020-09-22 DIAGNOSIS — Z79899 Other long term (current) drug therapy: Secondary | ICD-10-CM | POA: Insufficient documentation

## 2020-09-22 DIAGNOSIS — Z8371 Family history of colonic polyps: Secondary | ICD-10-CM | POA: Insufficient documentation

## 2020-09-22 DIAGNOSIS — Z8042 Family history of malignant neoplasm of prostate: Secondary | ICD-10-CM | POA: Diagnosis not present

## 2020-09-22 DIAGNOSIS — Z170 Estrogen receptor positive status [ER+]: Secondary | ICD-10-CM

## 2020-09-22 DIAGNOSIS — R059 Cough, unspecified: Secondary | ICD-10-CM | POA: Diagnosis not present

## 2020-09-22 DIAGNOSIS — R0602 Shortness of breath: Secondary | ICD-10-CM | POA: Diagnosis not present

## 2020-09-22 DIAGNOSIS — R112 Nausea with vomiting, unspecified: Secondary | ICD-10-CM | POA: Diagnosis not present

## 2020-09-22 DIAGNOSIS — I110 Hypertensive heart disease with heart failure: Secondary | ICD-10-CM | POA: Diagnosis not present

## 2020-09-22 DIAGNOSIS — Z85118 Personal history of other malignant neoplasm of bronchus and lung: Secondary | ICD-10-CM | POA: Diagnosis not present

## 2020-09-22 DIAGNOSIS — Z853 Personal history of malignant neoplasm of breast: Secondary | ICD-10-CM | POA: Diagnosis not present

## 2020-09-22 DIAGNOSIS — A310 Pulmonary mycobacterial infection: Secondary | ICD-10-CM | POA: Diagnosis present

## 2020-09-22 DIAGNOSIS — Z888 Allergy status to other drugs, medicaments and biological substances status: Secondary | ICD-10-CM | POA: Diagnosis not present

## 2020-09-22 DIAGNOSIS — Z836 Family history of other diseases of the respiratory system: Secondary | ICD-10-CM | POA: Insufficient documentation

## 2020-09-22 LAB — CBC WITH DIFFERENTIAL (CANCER CENTER ONLY)
Abs Immature Granulocytes: 0.06 10*3/uL (ref 0.00–0.07)
Basophils Absolute: 0.1 10*3/uL (ref 0.0–0.1)
Basophils Relative: 1 %
Eosinophils Absolute: 0.2 10*3/uL (ref 0.0–0.5)
Eosinophils Relative: 3 %
HCT: 35 % — ABNORMAL LOW (ref 36.0–46.0)
Hemoglobin: 11 g/dL — ABNORMAL LOW (ref 12.0–15.0)
Immature Granulocytes: 1 %
Lymphocytes Relative: 8 %
Lymphs Abs: 0.6 10*3/uL — ABNORMAL LOW (ref 0.7–4.0)
MCH: 28.7 pg (ref 26.0–34.0)
MCHC: 31.4 g/dL (ref 30.0–36.0)
MCV: 91.4 fL (ref 80.0–100.0)
Monocytes Absolute: 0.7 10*3/uL (ref 0.1–1.0)
Monocytes Relative: 9 %
Neutro Abs: 6.3 10*3/uL (ref 1.7–7.7)
Neutrophils Relative %: 78 %
Platelet Count: 402 10*3/uL — ABNORMAL HIGH (ref 150–400)
RBC: 3.83 MIL/uL — ABNORMAL LOW (ref 3.87–5.11)
RDW: 14.5 % (ref 11.5–15.5)
WBC Count: 7.9 10*3/uL (ref 4.0–10.5)
nRBC: 0 % (ref 0.0–0.2)

## 2020-09-22 LAB — CMP (CANCER CENTER ONLY)
ALT: 82 U/L — ABNORMAL HIGH (ref 0–44)
AST: 34 U/L (ref 15–41)
Albumin: 3.1 g/dL — ABNORMAL LOW (ref 3.5–5.0)
Alkaline Phosphatase: 243 U/L — ABNORMAL HIGH (ref 38–126)
Anion gap: 9 (ref 5–15)
BUN: 17 mg/dL (ref 8–23)
CO2: 26 mmol/L (ref 22–32)
Calcium: 8.9 mg/dL (ref 8.9–10.3)
Chloride: 101 mmol/L (ref 98–111)
Creatinine: 0.75 mg/dL (ref 0.44–1.00)
GFR, Estimated: >60 mL/min (ref >60)
Glucose, Bld: 107 mg/dL — ABNORMAL HIGH (ref 70–99)
Potassium: 3.8 mmol/L (ref 3.5–5.1)
Sodium: 136 mmol/L (ref 135–145)
Total Bilirubin: 0.3 mg/dL (ref 0.3–1.2)
Total Protein: 7.5 g/dL (ref 6.5–8.1)

## 2020-09-22 MED ORDER — TAMOXIFEN CITRATE 20 MG PO TABS: 20.0000 mg | ORAL_TABLET | Freq: Every day | ORAL | 4 refills | Status: DC

## 2020-09-22 NOTE — Progress Notes (Signed)
[facility] for Infectious Disease  Patient Active Problem List   Diagnosis Date Noted  . Mycobacterium avium infection ([place]) 02/11/2020    Priority: High  . Intractable nausea and vomiting 02/20/2020    Priority: Medium  . Weight loss, non-intentional 09/20/2019    Priority: Medium  . Skin rash 02/28/2020  . Hyponatremia   . Dehydration with hyponatremia 02/20/2020  . Low back pain 02/20/2020  . Chronic diastolic CHF (congestive heart failure) ([place]) 02/20/2020  . Essential hypertension 02/20/2020  . Acute metabolic encephalopathy 12/87/8676  . Right epiretinal membrane 01/30/2020  . 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 Nuclear sclerotic cataract of right eye 01/30/2020  . Nuclear sclerotic cataract of left eye 01/30/2020  . Iron deficiency anemia 01/20/2020  . Malignant neoplasm of overlapping sites of right breast in female, estrogen receptor positive ([place]) 09/20/2019  . Postprocedural pneumothorax   . 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 Port-A-Cath in place 04/26/2018  . Genetic testing 02/20/2018  . Family history of breast cancer   . Family history of lung cancer   . Personal history of lung cancer   . Chest pain 11/28/2017  . Abnormal CT of the chest 11/30/2016  . Malignant neoplasm of upper-inner quadrant of left breast in female, estrogen receptor positive ([place]) 06/16/2014  . Osteopenia 09/13/2013  . Postmenopausal atrophic vaginitis 09/13/2013  . Vitamin D deficiency 09/13/2013  . Malignant neoplasm of lower lobe of right lung ([place]) 07/24/2012  . Anxiety 03/11/2011  . History of neutropenia 03/11/2011  . Family history of colon cancer 03/11/2011  . Bronchiectasis without complication ([place]) 72/03/4708    Patient's Medications  New Prescriptions   No medications on file  Previous Medications   ACYCLOVIR (ZOVIRAX) 400 MG TABLET    TAKE 1 TABLET BY MOUTH TWICE DAILY   ALBUTEROL (VENTOLIN HFA) 108 (90 BASE) MCG/ACT INHALER    Inhale 2 puffs into the  lungs every 6 (six) hours as needed for wheezing or shortness of breath.   AZITHROMYCIN (ZITHROMAX) 250 MG TABLET       CLOFAZIMINE POWD    100 mg by Does not apply route every morning.   ETHAMBUTOL (MYAMBUTOL) 400 MG TABLET    Take 2 tablets (800 mg total) by mouth daily.   MULTIPLE VITAMIN (MULTIVITAMIN) CAPSULE    Take 1 capsule by mouth daily.   ONDANSETRON (ZOFRAN) 4 MG TABLET    Take 4 mg by mouth every 8 (eight) hours as needed for nausea or vomiting.   PANTOPRAZOLE (PROTONIX) 40 MG TABLET    TAKE 1 TABLET (40 MG TOTAL) BY MOUTH DAILY.   PROMETHAZINE (PHENERGAN) 12.5 MG TABLET    Take 1 tablet (12.5 mg total) by mouth every 8 (eight) hours as needed.   TAMOXIFEN (NOLVADEX) 20 MG TABLET    Take 1 tablet (20 mg total) by mouth at bedtime.  Modified Medications   No medications on file  Discontinued Medications   No medications on file    Subjective: Anna Valentine is in for her routine follow-up visit.  She started on a new 3 drug salvage regimen for Mycobacterium avium 6 weeks ago.  She is taking daily azithromycin, clofazimine and ethambutol.  She has some chronic nausea but this has not worsened since starting her antibiotic regimen.  Over the last 2 weeks she has noted some improvement.  Her cough has resolved completely and she has no longer having nighttime fever and sweats.  However she still remains very short of breath  with exertion.  She feels that this may be a little worse.  About 1 week ago she noted a decrease in hearing bilaterally.  She saw Dr. Renold Genta, her PCP, and received steroid and antibiotic injections.  She has not noted any improvement in her hearing.  Her appetite remains poor and she has continued to lose some weight.  She notes early satiety and estimates that she is only eating about 50% of her normal meals.  She does not snack between meals and is not taking any supplements.  Review of Systems: Review of Systems  Constitutional: Negative for chills, diaphoresis and  fever.  HENT: Positive for hearing loss. Negative for tinnitus.        Recent dry mouth.  Respiratory: Positive for shortness of breath. Negative for cough, hemoptysis, sputum production and wheezing.   Cardiovascular: Negative for chest pain.  Gastrointestinal: Positive for nausea. Negative for abdominal pain, diarrhea and vomiting.    Past Medical History:  Diagnosis Date  . Anemia   . BRCA negative 2011   BRCA I/ II negative  . Breast cancer ([place]) 08/2009   stage 2, rx with lumpectomy and xrt  . Bronchiectasis ([place])   . Chronic diastolic CHF (congestive heart failure) ([place]) 02/20/2020  . Constipation   . Essential hypertension 02/20/2020  . Family history of adverse reaction to anesthesia    My Sister has nausea  . Family history of breast cancer   . Family history of colon cancer   . Family history of lung cancer   . GERD (gastroesophageal reflux disease)    not presently having symptoms  . Hypertension   . Iatrogenic pneumothorax 02/06/2019  . Lung cancer ([place]) 06/06/05   stage 1 poorly differentiated adenocarcinoma, s/p right lower lobectomy.  . Osteopenia 09/13/2013  . Osteoporosis   . Personal history of lung cancer   . Pneumonia    2007ish, walking pnemonia - 2009 ish  . STD (sexually transmitted disease)    HSV  . Vitamin D deficiency 09/13/2013    Social History   Tobacco Use  . Smoking status: Former Smoker    Packs/day: 2.00    Years: 18.00    Pack years: 36.00    Quit date: 07/12/1987    Years since quitting: 33.2  . Smokeless tobacco: Never Used  Vaping Use  . Vaping Use: Never used  Substance Use Topics  . Alcohol use: Yes    Alcohol/week: 14.0 standard drinks    Types: 7 Glasses of wine, 7 Shots of liquor per week    Comment: social  . Drug use: No    Family History  Problem Relation Age of Onset  . Allergies Mother   . Asthma Mother   . Lung cancer Mother   . Breast cancer Mother 67       recurrence age 65  . Colon cancer Father 40  .  Prostate cancer Brother 3  . Breast cancer Sister 61       Recurrence age 47 BRCA negative  . Colon polyps Sister   . Leukemia Sister   . Breast cancer Sister 71  . Prostate cancer Brother 75  . Breast cancer Maternal Grandmother 63  . Colon cancer Maternal Aunt   . Leukemia Maternal Grandfather   . Lung cancer Maternal Aunt   . Breast cancer Cousin   . Esophageal cancer Neg Hx   . Rectal cancer Neg Hx   . Stomach cancer Neg Hx     Allergies  Allergen  Reactions  . Taxol [Paclitaxel] Anaphylaxis  . Tape Itching  . Abraxane [Paclitaxel Protein-Bound Part] Rash  . Clarithromycin Rash    Objective: Vitals:   09/22/20 1559  BP: 115/74  Pulse: 91  Temp: (!) 97.5 F (36.4 C)  TempSrc: Oral  Weight: 95 lb 12.8 oz (43.5 kg)   Body mass index is 15.46 kg/m.  Physical Exam Constitutional:      General: She is not in acute distress.    Appearance: She is not ill-appearing.     Comments: Her weight is down 7 pounds over the past 2 months.  HENT:     Mouth/Throat:     Mouth: Mucous membranes are moist.  Cardiovascular:     Rate and Rhythm: Normal rate and regular rhythm.     Heart sounds: No murmur heard.   Pulmonary:     Effort: Pulmonary effort is normal.     Breath sounds: Normal breath sounds. No wheezing, rhonchi or rales.  Abdominal:     Palpations: Abdomen is soft.     Tenderness: There is no abdominal tenderness.  Psychiatric:        Mood and Affect: Mood normal.       Problem List Items Addressed This Visit      High   Mycobacterium avium infection ([place])    I am hopeful that she is showing some early improvement on her new antibiotic regimen given the resolution of her cough, fever and night sweats.  So far she seems to be tolerating her antibiotics.  She will continue them and follow-up with me in 6 weeks.        Medium   Weight loss, non-intentional    I encouraged her to eat small more frequent meals.      Intractable nausea and vomiting     Her chronic nausea is unchanged.  She gets some relief with Phenergan.  I do not believe that it is related to her antibiotic therapy.  She is scheduled for upper endoscopy and colonoscopy in May.          Michel Bickers, MD [facility] for Infectious [place] [facility] [phone] pager   [phone] cell 09/22/2020, 4:26 PM

## 2020-09-22 NOTE — Assessment & Plan Note (Signed)
Her chronic nausea is unchanged.  She gets some relief with Phenergan.  I do not believe that it is related to her antibiotic therapy.  She is scheduled for upper endoscopy and colonoscopy in May.

## 2020-09-22 NOTE — Assessment & Plan Note (Signed)
I encouraged her to eat small more frequent meals.

## 2020-09-22 NOTE — Assessment & Plan Note (Signed)
I am hopeful that she is showing some early improvement on her new antibiotic regimen given the resolution of her cough, fever and night sweats.  So far she seems to be tolerating her antibiotics.  She will continue them and follow-up with me in 6 weeks.

## 2020-09-22 NOTE — Telephone Encounter (Signed)
Spoke with pt via phone and scheduled OV with Dr. Lamonte Sakai on 09/28/20 at 9:45am. Pt stated understanding. Nothing further needed at this time.

## 2020-09-23 ENCOUNTER — Telehealth: Payer: Self-pay | Admitting: Oncology

## 2020-09-23 NOTE — Telephone Encounter (Signed)
Scheduled appointments per 3/15 los. Spoke to patient who is aware of appointments date and times.

## 2020-09-28 ENCOUNTER — Other Ambulatory Visit: Payer: Self-pay

## 2020-09-28 ENCOUNTER — Ambulatory Visit (INDEPENDENT_AMBULATORY_CARE_PROVIDER_SITE_OTHER): Payer: Medicare Other

## 2020-09-28 ENCOUNTER — Ambulatory Visit (INDEPENDENT_AMBULATORY_CARE_PROVIDER_SITE_OTHER): Payer: Medicare Other | Admitting: Emergency Medicine

## 2020-09-28 ENCOUNTER — Encounter: Payer: Self-pay | Admitting: Emergency Medicine

## 2020-09-28 VITALS — BP 108/68 | HR 94 | Temp 97.5°F | Ht 66.0 in | Wt 95.8 lb

## 2020-09-28 DIAGNOSIS — A310 Pulmonary mycobacterial infection: Secondary | ICD-10-CM

## 2020-09-28 DIAGNOSIS — R0602 Shortness of breath: Secondary | ICD-10-CM

## 2020-09-28 DIAGNOSIS — J90 Pleural effusion, not elsewhere classified: Secondary | ICD-10-CM | POA: Diagnosis not present

## 2020-09-28 DIAGNOSIS — R9389 Abnormal findings on diagnostic imaging of other specified body structures: Secondary | ICD-10-CM | POA: Diagnosis not present

## 2020-09-28 DIAGNOSIS — R112 Nausea with vomiting, unspecified: Secondary | ICD-10-CM

## 2020-09-28 DIAGNOSIS — J479 Bronchiectasis, uncomplicated: Secondary | ICD-10-CM | POA: Diagnosis not present

## 2020-09-28 NOTE — Progress Notes (Signed)
Subjective:    Patient ID: Anna Valentine, female    DOB: 14-Feb-1951, 70 y.o.   MRN: [id number]  HPI  ROV 07/17/20 --70 year old woman with complicated CT chest, history of a right lower lobectomy for non-small cell lung cancer and severe bronchiectasis with structural abnormalities and cavitary disease in the setting of Mycobacterium avium.  She also has a history of breast cancer on tamoxifen.  I tried to start her on 3 drug therapy for MAC but we had to stop this because she became very symptomatic with side effects.  She was starting to have some chills when I saw her in October.  Most recent culture data from November grew out [facility] and Pseudomonas.  Today she reports dry cough. She does produce mucous mainly at night. Has albuterol but rarely uses.  She saw Dr. Megan Salon in mid December, was treated with empiric ciprofloxacin for acute bronchitis.  Plan is in place to try to rechallenge her with regimen for [facility] beginning next week.  Suspect that rifampin was the culprit that made her so sick.  Repeat CT scan of the chest performed 06/17/2020 reviewed by me, shows persistent severe bronchiectatic change and cavitation in the remaining right lung, probably increased compared with 02/20/2020 and with new dense consolidation of the dependent right upper lobe.  Also there is some consolidation and bronchial plugging in the lingula, micronodular disease consistent with atypical infection  ROV 09/28/2020 >> Pleasant 70 year old woman with complicated CT scan of the chest and complicated past medical history.  She has history of left and right breast cancer (tamoxifen), non-small cell lung cancer post right lower lobectomy, severe residual bronchiectasis and residual right lung cavitation/destruction due to Mycobacterium avium.  Also colonized with Pseudomonas.  Treating her MAIC has been difficult, had to be discontinued due to hypovolemic hyponatremia and severe nausea.  She is now back on azithromycin,  clofazimine, ethambutol since late January.  She is still dealing with daily nausea but no emesis, following with Dr. Havery Moros and planning for CSY, EGD, and considering D/C PPI.  Her cough and sweats are better.  Unfortunately she is experiencing progressive exertional dyspnea even with just daily chores around the house. Not at rest. She is having difficulty shopping or doing housework.  Also dealing with some decreased hearing, poor p.o. intake.  She uses albuterol twice a day with little change in symptoms. Her cough is better. Mucous production is down. Nausea is even improving.    Chest x-ray 3/21 reviewed by me, shows similar right lung volume loss and distortion, infiltrate.  No evidence of evolving left effusion or significant left-sided infiltrates.  MDM: Reviewed Dr. Doyne Keel note from 08/26/2020 Reviewed Dr. Hale Bogus note from 09/22/2020   Review of Systems  Constitutional:  Positive for activity change and fatigue. Negative for fever and unexpected weight change.  HENT: Negative.  Negative for congestion, dental problem, ear pain, nosebleeds, postnasal drip, rhinorrhea, sinus pressure, sneezing, sore throat and trouble swallowing.   Eyes: Negative.  Negative for redness and itching.  Respiratory: Negative.  Negative for cough, chest tightness, shortness of breath and wheezing.   Cardiovascular: Negative.  Negative for palpitations and leg swelling.  Gastrointestinal: Negative.  Negative for nausea and vomiting.  Genitourinary: Negative.  Negative for dysuria.  Musculoskeletal:  Positive for back pain. Negative for joint swelling.  Skin: Negative.  Negative for rash.  Neurological: Negative.  Negative for headaches.  Hematological: Negative.  Does not bruise/bleed easily.  Psychiatric/Behavioral: Negative.  Negative for dysphoric mood.  The patient is not nervous/anxious.        Objective:   Physical Exam Vitals:   09/28/20 0949  BP: 108/68  Pulse: 94  Temp: (!) 97.5  F (36.4 C)  TempSrc: Temporal  SpO2: 97%  Weight: 95 lb 12.8 oz (43.5 kg)  Height: _0  (1.676 m)   Gen: tired, ill appearing, thin woman, well-nourished, in no distress  ENT: No lesions,  mouth clear,  oropharynx clear, no postnasal drip  Neck: No JVD, no stridor  Lungs: No use of accessory muscles, coarse R lung with some inspiratory and expiratory rhonchi. L is clear  Cardiovascular: RRR, heart sounds normal, no murmur or gallops, no peripheral edema  Musculoskeletal: No deformities, no cyanosis or clubbing  Neuro: alert, non focal  Skin: Warm, no lesions or rash     Assessment & Plan:  Mycobacterium avium infection ([place]) We will perform a repeat CT scan of the chest with angiography to evaluate your lung tissue and also look for chronic or acute blood clots. Please continue your 3 antibiotics as directed by Dr. Megan Salon Follow with Dr Lamonte Sakai next available after your CT chest so that we can review together  Abnormal CT of the chest Complicated CT chest with Btx and R lung destruction due to [facility]. On 3 drug regimen, probably at least in part responsible for her nausea.   We will perform a repeat CT scan of the chest with angiography to evaluate your lung tissue and also look for chronic or acute blood clots.  Intractable nausea and vomiting GI workup underway  Baltazar Apo, MD, PhD 03/12/2021, 2:09 PM [facility] [phone] or if no answer [phone]

## 2020-09-28 NOTE — Patient Instructions (Signed)
We will perform a repeat CT scan of the chest with angiography to evaluate your lung tissue and also look for chronic or acute blood clots. Please continue your 3 antibiotics as directed by Dr. Frederich Chick to use albuterol 2 puffs up to every 4 hours if you need it for shortness of breath Agree with endoscopy and colonoscopy with Dr. Havery Moros as planned Follow with Dr Lamonte Sakai next available after your CT chest so that we can review together

## 2020-10-02 ENCOUNTER — Other Ambulatory Visit: Payer: Self-pay

## 2020-10-02 ENCOUNTER — Ambulatory Visit (HOSPITAL_COMMUNITY)
Admission: RE | Admit: 2020-10-02 | Discharge: 2020-10-02 | Disposition: A | Discharge status: Home / Self Care | Payer: Medicare Other | Source: Ambulatory Visit | Attending: Emergency Medicine | Admitting: Emergency Medicine

## 2020-10-02 DIAGNOSIS — R079 Chest pain, unspecified: Secondary | ICD-10-CM | POA: Diagnosis not present

## 2020-10-02 DIAGNOSIS — J90 Pleural effusion, not elsewhere classified: Secondary | ICD-10-CM | POA: Diagnosis not present

## 2020-10-02 DIAGNOSIS — I509 Heart failure, unspecified: Secondary | ICD-10-CM | POA: Diagnosis not present

## 2020-10-02 DIAGNOSIS — R0602 Shortness of breath: Secondary | ICD-10-CM | POA: Insufficient documentation

## 2020-10-02 MED ORDER — IOHEXOL 350 MG/ML SOLN
80.0000 mL | Freq: Once | INTRAVENOUS | Status: AC | PRN
  Administered 2020-10-02: 80 mL via INTRAVENOUS

## 2020-10-05 NOTE — Telephone Encounter (Signed)
I think she should see ENT physician. Her situation is complex. May need some  tests to see what is going on in middle ear. Please make referral.

## 2020-10-05 NOTE — Progress Notes (Signed)
Subjective:    Patient ID: Anna Valentine, female    DOB: 07/22/1950, 70 y.o.   MRN: [id number]  HPI 70 year old Female with history of atypical Mycobacterial infection of lungs, history of lung cancer, history of breast cancer followed by Dr. Lamonte Sakai, Pulmonary and Dr. Michel Bickers with Infectious Diseases called with complaint of bilateral ear congestion.  She tolerates azithromycin by itself but did not tolerate it when given with with ethambutol and rifampin in 2021 for atypical Mycobacterial infection.Developed nausea,weakness fatigue, weakness and hyponatremia requiring IV fluids.  Patient has complex history.  In 2006 she underwent a right upper lobe wedge resection, middle lobe wedge resection, left lower lobectomy and mediastinal lymph node dissection for non-small cell lung cancer stage I.  Pathology showed 1.2 cm poorly differentiated adenocarcinoma.  Has been followed at [facility] and also by Dr. Jana Hakim here in [place].  Had bronchoscopy by Dr. Lamonte Sakai in 2020.  Was found to have bronchiectasis, infection with Mycobacterium avium colonized with Pseudomonas treated multiple antibiotics.  Subsequently developed thick walled cavitary lesion in posterior right upper lobe in July 2021.  PET scan showed hypermetabolism in the cavitary lesion as well as the right lung parenchyma.  Was started on 3 drug therapy for Mycobacterium AVM intracellulare with azithromycin, rifampin, and ethambutol which led to hyponatremia and volume depletion due to nausea  In February 2011 she was diagnosed with breast cancer and underwent left lumpectomy with diagnosis of lobular carcinoma.  Sentinel node biopsy showed 1 out of 4 lymph nodes positive for invasive lobular carcinoma.  She was clinically stage II.  Tumor was ER positive PR positive HER-2/neu negative.  Had radiation therapy which was completed in July 2011.  She underwent bilateral total mastectomies with right sentinel lymph node biopsy September 2019.   She had right breast cancer triple positive at that time.  Genetic testing revealed positive p53 gene mutation.  Has been diagnosed with pulmonary hypertension and followed by Cardiology.  She has dyspnea but is not on supplemental oxygen.  History of iron deficiency and has received  iron infusions and iron level has improved. This may be nutritional as her appetite has been affected by her chronic illnesses for several months.  She is scheduled for upper endoscopy and colonoscopy by Dr. Havery Moros in May.  Has history of nausea but thought to be due possibly to antibiotics.  Is on tamoxifen and is scheduled to stay on that for a total of 5 years.   Addendum: She saw Dr. Jana Hakim March 15. He gave her a Z-pak.       Review of Systems she has no fever or shaking chills but cannot hear well due to ear congestion.     Objective:   Physical Exam  BP 100/60 pulse 72 T 99.9 degrees pulse ox 98% weight 96 pounds. BMI 15.49 Thin frail female sitting in exam room.  Pharynx is clear.  Neck is supple.  No palpable cervical adenopathy.  Her chest is clear.  TMs look to be slightly full.  They are not red.     Assessment & Plan:  Probable bilateral serous otitis media  History of atypical Mycobacterium infection of the lungs  History of lung cancer  History of breast cancer  BMI 15.49  Nausea-likely secondary to medications  Plan: Patient was given 1 g IM Rocephin on March 11 and 80 mg methylprednisolone IM in the office.  I am concerned that she may need to see ENT physician as ear congestion  has been prolonged.  May even need to consider grommet tubes.  Addendum: Dr. Jana Hakim gave her a Z-Pak on March 15 after I saw her on March 11.  Addendum: October 05, 2020: She called saying that her ears are still stopped up.  We are going to refer her to ENT physician for further evaluation and for consideration of grommet tubes if necessary.

## 2020-10-05 NOTE — Telephone Encounter (Signed)
I spoke with her and explained the CT results. She has a new clot at the stump of her RLL PA. I don't think this is clinically significant, and there wasn't any other PE seen. She does have some new micronodularity at her L base. This is disappointing since she is on [facility] therapy. I am worried that her R lung is so injured that it is not treatable and is putting the L lung at risk. It doesn't look like there is enough LLL disease to explain her SOB.   I'll need to talk to Dr Megan Salon to determine how long we treat w abx before we think about other strategies - possibly even R pneumonectomy.

## 2020-10-05 NOTE — Patient Instructions (Addendum)
Given 1 g IM Rocephin and 80 mg methylprednisolone IM in office.  Dr. Jana Hakim added a Z-Pak later in the week after I saw her.  She is still having issues with bilateral ear congestion and will be referred to ENT physician for consideration of  Grommet tubes.

## 2020-10-05 NOTE — Telephone Encounter (Signed)
Anna Valentine called back today to say she is still having trouble with her ears, some mornings they seem cleared up, but by the end of the day they are stopped up again, like last night she couldn't hear out of them and this morning they are stopped up. It sounds like she is talking in a barrel.

## 2020-10-05 NOTE — Telephone Encounter (Signed)
Please advise on patient mychart message  Would you please ask Dr. Lamonte Sakai if he will give me a call to review the results of the CT scan I had Friday? My next appointment with him is not until the end of April.

## 2020-10-05 NOTE — Telephone Encounter (Signed)
Spoke with [name] and let her know we were putting in a referral for ENT

## 2020-10-06 ENCOUNTER — Ambulatory Visit: Payer: Medicare Other | Admitting: Gastroenterology

## 2020-10-30 ENCOUNTER — Encounter: Payer: Medicare Other | Admitting: Gastroenterology

## 2020-11-03 ENCOUNTER — Other Ambulatory Visit: Payer: Self-pay

## 2020-11-03 ENCOUNTER — Ambulatory Visit (AMBULATORY_SURGERY_CENTER): Payer: Self-pay

## 2020-11-03 VITALS — Ht 66.0 in | Wt 99.0 lb

## 2020-11-03 DIAGNOSIS — D649 Anemia, unspecified: Secondary | ICD-10-CM

## 2020-11-03 DIAGNOSIS — Z800 Family history of malignant neoplasm of digestive organs: Secondary | ICD-10-CM

## 2020-11-03 DIAGNOSIS — R110 Nausea: Secondary | ICD-10-CM

## 2020-11-03 NOTE — Progress Notes (Signed)
No egg or soy allergy known to patient  No issues with past sedation with any surgeries or procedures Patient denies ever being told they had issues or difficulty with intubation  No FH of Malignant Hyperthermia No diet pills per patient No home 02 use per patient  No blood thinners per patient  Pt denies issues with constipation at this time; hx of constipation per patient; No A fib or A flutter  EMMI video via MyChart  COVID 19 guidelines implemented in [place] today with Pt and RN  Pt is fully vaccinated for Covid x 2 + booster;  Discussed with pt there will be an out-of-pocket cost for prep and that varies from $0 to 70 dollars   Due to the COVID-19 pandemic we are asking patients to follow certain guidelines.  Pt aware of COVID protocols and [facility] guidelines

## 2020-11-04 ENCOUNTER — Encounter: Payer: Self-pay | Admitting: Internal Medicine

## 2020-11-04 ENCOUNTER — Ambulatory Visit (INDEPENDENT_AMBULATORY_CARE_PROVIDER_SITE_OTHER): Payer: Medicare Other | Admitting: Internal Medicine

## 2020-11-04 DIAGNOSIS — R112 Nausea with vomiting, unspecified: Secondary | ICD-10-CM | POA: Diagnosis not present

## 2020-11-04 DIAGNOSIS — H9193 Unspecified hearing loss, bilateral: Secondary | ICD-10-CM | POA: Diagnosis not present

## 2020-11-04 DIAGNOSIS — A310 Pulmonary mycobacterial infection: Secondary | ICD-10-CM

## 2020-11-04 DIAGNOSIS — H9190 Unspecified hearing loss, unspecified ear: Secondary | ICD-10-CM | POA: Insufficient documentation

## 2020-11-04 DIAGNOSIS — R634 Abnormal weight loss: Secondary | ICD-10-CM | POA: Diagnosis not present

## 2020-11-04 NOTE — Assessment & Plan Note (Signed)
Her chronic nausea has improved since starting her current antibiotic regimen suggesting that it was caused, at least in part by her active pneumonia.

## 2020-11-04 NOTE — Assessment & Plan Note (Signed)
I do not know the cause of her recent hearing loss and tinnitus but I do not have any reason to believe that it is related to her mycobacterial infection or her current 3 drug antibiotic regimen.

## 2020-11-04 NOTE — Assessment & Plan Note (Signed)
Her usual adult weight is around 115-120 pounds.  Her appetite has improved on her current antibiotic regimen and she feels like she is slowly starting to regain some of her lost weight.  She does not like drinking Ensure so I suggested that she simply focus on eating and drinking things that she enjoys.

## 2020-11-04 NOTE — Progress Notes (Addendum)
[facility]  Patient Active Problem List   Diagnosis Date Noted  . Mycobacterium avium infection ([place]) 02/11/2020    Priority: High  . Hearing loss 11/04/2020    Priority: Medium  . Intractable nausea and vomiting 02/20/2020    Priority: Medium  . Weight loss, non-intentional 09/20/2019    Priority: Medium  . Skin rash 02/28/2020  . Hyponatremia   . Dehydration with hyponatremia 02/20/2020  . Low back pain 02/20/2020  . Chronic diastolic CHF (congestive heart failure) ([place]) 02/20/2020  . Essential hypertension 02/20/2020  . Acute metabolic encephalopathy 37/10/8887  . Right epiretinal membrane 01/30/2020  . 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 Nuclear sclerotic cataract of right eye 01/30/2020  . Nuclear sclerotic cataract of left eye 01/30/2020  . Iron deficiency anemia 01/20/2020  . Malignant neoplasm of overlapping sites of right breast in female, estrogen receptor positive ([place]) 09/20/2019  . Postprocedural pneumothorax   . 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 Port-A-Cath in place 04/26/2018  . Genetic testing 02/20/2018  . Family history of breast cancer   . Family history of lung cancer   . Personal history of lung cancer   . Chest pain 11/28/2017  . Abnormal CT of the chest 11/30/2016  . Malignant neoplasm of upper-inner quadrant of left breast in female, estrogen receptor positive ([place]) 06/16/2014  . Osteopenia 09/13/2013  . Postmenopausal atrophic vaginitis 09/13/2013  . Vitamin D deficiency 09/13/2013  . Malignant neoplasm of lower lobe of right lung ([place]) 07/24/2012  . Anxiety 03/11/2011  . History of neutropenia 03/11/2011  . Family history of colon cancer 03/11/2011  . Bronchiectasis without complication ([place]) 16/94/5038    Patient's Medications  New Prescriptions   No medications on file  Previous Medications   ACYCLOVIR (ZOVIRAX) 400 MG TABLET    TAKE 1 TABLET BY MOUTH TWICE DAILY   ALBUTEROL (VENTOLIN HFA) 108 (90  BASE) MCG/ACT INHALER    Inhale 2 puffs into the lungs every 6 (six) hours as needed for wheezing or shortness of breath.   AZITHROMYCIN (ZITHROMAX) 500 MG TABLET    Take 500 mg by mouth daily.   CLOFAZIMINE POWD    100 mg by Does not apply route every morning.   ETHAMBUTOL (MYAMBUTOL) 400 MG TABLET    Take 2 tablets (800 mg total) by mouth daily.   PROMETHAZINE (PHENERGAN) 12.5 MG TABLET    Take 1 tablet (12.5 mg total) by mouth every 8 (eight) hours as needed.   TAMOXIFEN (NOLVADEX) 20 MG TABLET    Take 1 tablet (20 mg total) by mouth at bedtime.  Modified Medications   No medications on file  Discontinued Medications   No medications on file    Subjective: Anna Valentine is in for her routine follow-up visit.  She started on her salvage regimen of azithromycin, clofazimine and ethambutol on 08/04/2020 for her progressive Mycobacterium avium pneumonia.  She did not tolerate rifampin as part of a 3 drug regimen consisting of azithromycin, rifampin and ethambutol last year.  Since starting her current regimen she is feeling much better.  She has had no problems tolerating her current regimen.  Her chronic nausea is much better and is now only mild and intermittent.  She has no cough and only rare sputum production.  Her appetite has improved and she has been able to gain a few pounds.  She is feeling stronger.  However, she has not noted any improvement in her dyspnea on exertion and thinks  that it may actually be slightly worse.  She also still notes bilateral decrease in hearing and has recently developed some transient tinnitus that she notes after climbing stairs.  She is scheduled for ENT evaluation next week.  Review of Systems: Review of Systems  Constitutional: Positive for malaise/fatigue. Negative for chills, diaphoresis, fever and weight loss.  HENT: Positive for hearing loss and tinnitus.   Respiratory: Positive for shortness of breath. Negative for cough, hemoptysis, sputum production and  wheezing.   Cardiovascular: Negative for chest pain.  Gastrointestinal: Positive for nausea. Negative for abdominal pain, diarrhea and vomiting.  Skin:       No skin discoloration.    Past Medical History:  Diagnosis Date  . Anemia    hx of IDA  . BRCA negative 2011   BRCA I/ II negative  . Breast cancer ([place]) 08/2009   LEFT=stage 2, rx with lumpectomy and xrt  . Breast cancer ([place]) 2019   RIGHT  . Bronchiectasis ([place])   . Chronic diastolic CHF (congestive heart failure) ([place]) 02/20/2020  . Constipation   . Essential hypertension 02/20/2020  . Family history of adverse reaction to anesthesia    My Sister has nausea  . Family history of breast cancer   . Family history of colon cancer   . Family history of lung cancer   . GERD (gastroesophageal reflux disease)    not presently having symptoms  . Hypertension   . Iatrogenic pneumothorax 02/06/2019  . Lung cancer ([place]) 06/06/05   stage 1 poorly differentiated adenocarcinoma, s/p right lower lobectomy.  . Mycobacterium avium complex ([place])    dx 2021  . Osteopenia 09/13/2013  . Osteoporosis   . Personal history of lung cancer   . Pneumonia    2007ish, walking pnemonia - 2009 ish  . STD (sexually transmitted disease)    HSV  . Vitamin D deficiency 09/13/2013    Social History   Tobacco Use  . Smoking status: Former Smoker    Packs/day: 2.00    Years: 18.00    Pack years: 36.00    Quit date: 07/12/1987    Years since quitting: 33.3  . Smokeless tobacco: Never Used  Vaping Use  . Vaping Use: Never used  Substance Use Topics  . Alcohol use: Yes    Comment: 1 time per month  . Drug use: No    Family History  Problem Relation Age of Onset  . Allergies Mother   . Asthma Mother   . Lung cancer Mother   . Breast cancer Mother 60       recurrence age 75  . Colon cancer Father 66  . Colon polyps Father 23  . Prostate cancer Brother 60  . Breast cancer Sister 55       Recurrence age 23 BRCA negative  . Colon polyps  Sister   . Leukemia Sister   . Breast cancer Sister 52  . Prostate cancer Brother 79  . Breast cancer Maternal Grandmother 78  . Colon cancer Maternal Aunt   . Leukemia Maternal Grandfather   . Lung cancer Maternal Aunt   . Breast cancer Cousin   . Esophageal cancer Neg Hx   . Rectal cancer Neg Hx   . Stomach cancer Neg Hx     Allergies  Allergen Reactions  . Rifampin Nausea And Vomiting  . Taxol [Paclitaxel] Anaphylaxis  . Tape Itching  . Abraxane [Paclitaxel Protein-Bound Part] Rash  . Clarithromycin Rash    Objective: Vitals:  11/04/20 1125  BP: 132/82  Pulse: 80  SpO2: 100%  Weight: 98 lb (44.5 kg)   Body mass index is 15.82 kg/m.  Physical Exam Constitutional:      Comments: Her spirits are very good.  Her weight is up 2 pounds.  Cardiovascular:     Rate and Rhythm: Normal rate and regular rhythm.     Heart sounds: No murmur heard.   Pulmonary:     Effort: Pulmonary effort is normal.     Breath sounds: No wheezing, rhonchi or rales.  Abdominal:     Palpations: Abdomen is soft.     Tenderness: There is no abdominal tenderness.  Skin:    Findings: No rash.  Psychiatric:        Mood and Affect: Mood normal.    Chest CT 10/02/2020   IMPRESSION: 1. Thrombus within the residual stump of the right lower lobe pulmonary artery, likely interval in situ thrombus as result of prior right lower lobectomy. No other signs of pulmonary embolus. 2. Postsurgical changes from right lower lobectomy, with progressive cavitary changes and bronchiectasis throughout the right lung consistent with sequela of atypical infection. 3. Stable nodular consolidation within the lingula, with increasing nodular airspace disease throughout the left lower lobe, also consistent with progressive atypical infection. 4. Stable small right pleural effusion. 5. Aortic Atherosclerosis (ICD10-I70.0). Coronary artery atherosclerosis.     Problem List Items Addressed This Visit       High   Mycobacterium avium infection ([place])    Although her repeat CT scan 1 month ago showed some progressive changes in her left lung, she is having significant clinical improvement since starting her current 3 drug antibiotic regimen.  I wonder if some of the changes seen on CT occurred between the time that her last CT scan was done in early December and when she started on her current regimen in late January.  I am hopeful that she will continue to have clinical improvement and that we will eventually see radiographic stabilization and possible improvement.  Fortunately she is tolerating her current regimen well.  She will continue it and follow-up with me in 6 weeks.        Medium   Weight loss, non-intentional    Her usual adult weight is around 115-120 pounds.  Her appetite has improved on her current antibiotic regimen and she feels like she is slowly starting to regain some of her lost weight.  She does not like drinking Ensure so I suggested that she simply focus on eating and drinking things that she enjoys.      Intractable nausea and vomiting    Her chronic nausea has improved since starting her current antibiotic regimen suggesting that it was caused, at least in part by her active pneumonia.      Hearing loss    I do not know the cause of her recent hearing loss and tinnitus but I do not have any reason to believe that it is related to her mycobacterial infection or her current 3 drug antibiotic regimen.          Michel Bickers, MD [facility] [phone] pager   [phone] cell 11/04/2020, 12:21 PM

## 2020-11-04 NOTE — Assessment & Plan Note (Signed)
Although her repeat CT scan 1 month ago showed some progressive changes in her left lung, she is having significant clinical improvement since starting her current 3 drug antibiotic regimen.  I wonder if some of the changes seen on CT occurred between the time that her last CT scan was done in early December and when she started on her current regimen in late January.  I am hopeful that she will continue to have clinical improvement and that we will eventually see radiographic stabilization and possible improvement.  Fortunately she is tolerating her current regimen well.  She will continue it and follow-up with me in 6 weeks.

## 2020-11-05 ENCOUNTER — Telehealth: Payer: Self-pay | Admitting: Gastroenterology

## 2020-11-05 ENCOUNTER — Encounter: Payer: Self-pay | Admitting: Emergency Medicine

## 2020-11-05 ENCOUNTER — Other Ambulatory Visit: Payer: Self-pay

## 2020-11-05 ENCOUNTER — Ambulatory Visit (INDEPENDENT_AMBULATORY_CARE_PROVIDER_SITE_OTHER): Payer: Medicare Other | Admitting: Emergency Medicine

## 2020-11-05 DIAGNOSIS — C3431 Malignant neoplasm of lower lobe, right bronchus or lung: Secondary | ICD-10-CM

## 2020-11-05 DIAGNOSIS — J479 Bronchiectasis, uncomplicated: Secondary | ICD-10-CM

## 2020-11-05 DIAGNOSIS — A310 Pulmonary mycobacterial infection: Secondary | ICD-10-CM | POA: Diagnosis not present

## 2020-11-05 NOTE — Patient Instructions (Signed)
Please continue to use your albuterol 2 puffs up to every 4 hours if needed for shortness of breath.  Try pretreating significant exertion with 2 puffs to prevent short windedness with these tasks. We will hold off on starting a long-acting asthma medication or inhaled steroid for now because I do not want to compromise our ability to maximally treat your mycobacterial infection. We will repeat your CT scan of the chest with contrast in September 2022 Continue your 3 antibiotics as directed by Dr. Megan Salon and follow with him as planned Follow Dr. Lamonte Sakai in 3 months or sooner if you have any problems.

## 2020-11-05 NOTE — Telephone Encounter (Signed)
Pt asking can she take her regular meds Thursday the day before the colon- Instructed yes-  Take meds as directed Thursday and she can take them Friday as well as long as they are taken by 8 am

## 2020-11-05 NOTE — Assessment & Plan Note (Signed)
With probable associated asthma.  She is benefiting from albuterol, still has dyspnea.  We talked about starting maintenance therapy today but I am hesitant to give her an ICS as I do not want to do anything to compromise her ability to maximally treat MAIC.  She understands this.  We can reconsider going forward depending on how she progresses.

## 2020-11-05 NOTE — Progress Notes (Signed)
Subjective:    Patient ID: Anna Valentine, female    DOB: 05/08/1951, 70 y.o.   MRN: [id number]  HPI  ROV 09/28/2020 >> Pleasant 70 year old woman with complicated CT scan of the chest and complicated past medical history.  She has history of left and right breast cancer (tamoxifen), non-small cell lung cancer post right lower lobectomy, severe residual bronchiectasis and residual right lung cavitation/destruction due to Mycobacterium avium.  Also colonized with Pseudomonas.  Treating her MAIC has been difficult, had to be discontinued due to hypovolemic hyponatremia and severe nausea.  She is now back on azithromycin, clofazimine, ethambutol since late January.  She is still dealing with daily nausea but no emesis, following with Dr. Havery Moros and planning for CSY, EGD, and considering D/C PPI.  Her cough and sweats are better.  Unfortunately she is experiencing progressive exertional dyspnea even with just daily chores around the house. Not at rest. She is having difficulty shopping or doing housework.  Also dealing with some decreased hearing, poor p.o. intake.  She uses albuterol twice a day with little change in symptoms. Her cough is better. Mucous production is down. Nausea is even improving.   Chest x-ray 3/21 reviewed by me, shows similar right lung volume loss and distortion, infiltrate.  No evidence of evolving left effusion or significant left-sided infiltrates.  ROV 11/05/2020 -- Anna Valentine is 73, follows up today for her complicated CT scan of the chest that we have ascribed to severe residual bronchiectasis and lung cavitation due to Mycobacterium avium.  She has been on therapy for this since 07/2020.  She also has a history of bilateral breast cancer on tamoxifen, non-small cell lung cancer post right lower lobe lobectomy.  She continues to have exertional dyspnea, is using her albuterol about once daily. She is going to undergo colonoscopy EGD with Dr. Havery Moros for nausea  (improving). She saw Dr. Megan Salon yesterday.  Overall she has had some clinical improvement although her dyspnea persists.  He was encouraged and willing to continue 3 drug therapy. Less nausea, better energy, less cough / sputum, better appetite, no sweats.   CT-PA done 10/02/2020 reviewed by me shows some thrombus in the residual stump of the right lower lobe pulmonary artery that appears to be clot in situ post her prior right lower lobe lobectomy.  There is no sign of any other pulmonary embolism.  There is now micronodular involvement in the lingula as well as in the left lower lobe consistent with atypical infection.    Review of Systems  Constitutional: Positive for activity change and fatigue. Negative for fever and unexpected weight change.  HENT: Negative.  Negative for congestion, dental problem, ear pain, nosebleeds, postnasal drip, rhinorrhea, sinus pressure, sneezing, sore throat and trouble swallowing.   Eyes: Negative.  Negative for redness and itching.  Respiratory: Negative.  Negative for cough, chest tightness, shortness of breath and wheezing.   Cardiovascular: Negative.  Negative for palpitations and leg swelling.  Gastrointestinal: Negative.  Negative for nausea and vomiting.  Genitourinary: Negative.  Negative for dysuria.  Musculoskeletal: Positive for back pain. Negative for joint swelling.  Skin: Negative.  Negative for rash.  Neurological: Negative.  Negative for headaches.  Hematological: Negative.  Does not bruise/bleed easily.  Psychiatric/Behavioral: Negative.  Negative for dysphoric mood. The patient is not nervous/anxious.         Objective:   Physical Exam Vitals:   11/05/20 1012  BP: 122/70  Pulse: 80  Temp: 98 F (36.7 C)  TempSrc: Temporal  SpO2: 100%  Weight: 98 lb 12.8 oz (44.8 kg)  Height: 5\' 6"  (1.676 m)   Gen: stronger, thin woman, well-nourished, in no distress  ENT: No lesions,  mouth clear,  oropharynx clear, no postnasal  drip  Neck: No JVD, no stridor  Lungs: No use of accessory muscles, coarse R lung with some inspiratory and expiratory rhonchi. L is clear  Cardiovascular: RRR, heart sounds normal, no murmur or gallops, no peripheral edema  Musculoskeletal: No deformities, no cyanosis or clubbing  Neuro: alert, non focal  Skin: Warm, no lesions or rash     Assessment & Plan:  Malignant neoplasm of lower lobe of right lung (HCC) No current evidence for recurrence.  She underwent repeat bronchoscopy when her right lung parenchyma was evolving due to [facility].  This was negative for malignancy.  Bronchiectasis without complication ([place]) With probable associated asthma.  She is benefiting from albuterol, still has dyspnea.  We talked about starting maintenance therapy today but I am hesitant to give her an ICS as I do not want to do anything to compromise her ability to maximally treat MAIC.  She understands this.  We can reconsider going forward depending on how she progresses.  Mycobacterium avium infection ([place]) She has had an overall significant benefit from antibiotic therapy, now over 4 months in.  She does have some left-sided micronodular disease on the most recent CT chest that concerns me.  I am still hopeful that we can treat this medically but acknowledge that the right lung is very damaged and may act as a reservoir for infection.  For now I think it is okay to continue her regimen under the guidance of Dr. Megan Salon, repeat her CT chest in 6 months.  I will do a CT-PA at that time since she had clot in the stump of her right lower lobe pulmonary artery.  She did not have any other evidence for relevant pulmonary embolism.  Baltazar Apo, MD, PhD 11/05/2020, 10:39 AM [facility] [phone] or if no answer [phone]

## 2020-11-05 NOTE — Assessment & Plan Note (Signed)
No current evidence for recurrence.  She underwent repeat bronchoscopy when her right lung parenchyma was evolving due to [facility].  This was negative for malignancy.

## 2020-11-05 NOTE — Addendum Note (Signed)
Addended by: Gavin Potters R on: 11/05/2020 11:04 AM   Modules accepted: Orders

## 2020-11-05 NOTE — Telephone Encounter (Signed)
Inbound call from patient requesting a call from a nurse please.  Has questions about whether she should take certain medications she is currently taking.  Please advise.

## 2020-11-05 NOTE — Assessment & Plan Note (Signed)
She has had an overall significant benefit from antibiotic therapy, now over 4 months in.  She does have some left-sided micronodular disease on the most recent CT chest that concerns me.  I am still hopeful that we can treat this medically but acknowledge that the right lung is very damaged and may act as a reservoir for infection.  For now I think it is okay to continue her regimen under the guidance of Dr. Megan Salon, repeat her CT chest in 6 months.  I will do a CT-PA at that time since she had clot in the stump of her right lower lobe pulmonary artery.  She did not have any other evidence for relevant pulmonary embolism.

## 2020-11-09 DIAGNOSIS — H903 Sensorineural hearing loss, bilateral: Secondary | ICD-10-CM | POA: Diagnosis not present

## 2020-11-10 ENCOUNTER — Telehealth: Payer: Self-pay | Admitting: Pharmacist

## 2020-11-10 ENCOUNTER — Encounter: Payer: Self-pay | Admitting: Pharmacist

## 2020-11-10 NOTE — Telephone Encounter (Signed)
Patient's clofazimine has been delivered to clinic. Will reach out to patient to see when she can pick them up.

## 2020-11-10 NOTE — Telephone Encounter (Signed)
Please advise on patient mychart message or if there is anyone else that we need to let know about a possible breech  Thought you might want to know that I received a text concerning my referral from Dr. Lamonte Sakai for cardio rehab. I was asked to enter my birthdate, which I did. I was then asked to call [phone] to set up appointment. Upon calling I learned that the pulmonary/cardio rehab had received a number of these calls which they had not initiated. Apparently there has been a breech . I was told they have the referral but there are about 60 people ahead of me and that I would receive a phone call, not a text, when my turn approached.

## 2020-11-11 ENCOUNTER — Other Ambulatory Visit: Payer: Medicare Other

## 2020-11-11 ENCOUNTER — Other Ambulatory Visit: Payer: Self-pay

## 2020-11-11 ENCOUNTER — Telehealth (HOSPITAL_COMMUNITY): Payer: Self-pay

## 2020-11-11 DIAGNOSIS — A310 Pulmonary mycobacterial infection: Secondary | ICD-10-CM

## 2020-11-11 NOTE — Telephone Encounter (Signed)
Thank you for letting us know.  

## 2020-11-11 NOTE — Telephone Encounter (Signed)
Called patient to see if she is interested in the Pulmonary Rehab Program. Patient expressed interest. Explained scheduling process and went over insurance, patient verbalized understanding. Also adv pt where we are with scheduling for PR and that we have a back log. (1-4 months) 

## 2020-11-11 NOTE — Telephone Encounter (Signed)
Pt insurance is active and benefits verified through Medicare A/B. Co-pay $0.00, DED $233.00/$233.00 met, out of pocket $0.00/$0.00 met, co-insurance 20%. No pre-authorization required. 11/11/20 @ 342PM  2ndary insurance is active and benefits verified through [employer]. Co-pay $0.00, DED $0.00/$0.00 met, out of pocket $0.00/$0.00 met, co-insurance 0%. No pre-authorization required. 11/11/20 @ 343PM  Will contact patient to see if she is interested in the Pulmonary Rehab Program.

## 2020-11-13 ENCOUNTER — Other Ambulatory Visit: Payer: Self-pay

## 2020-11-13 ENCOUNTER — Encounter: Payer: Medicare Other | Admitting: Gastroenterology

## 2020-11-13 ENCOUNTER — Encounter: Payer: Self-pay | Admitting: Gastroenterology

## 2020-11-13 ENCOUNTER — Ambulatory Visit (AMBULATORY_SURGERY_CENTER): Payer: Medicare Other | Admitting: Gastroenterology

## 2020-11-13 VITALS — BP 133/77 | HR 73 | Temp 98.6°F | Resp 13 | Ht 66.0 in | Wt 99.0 lb

## 2020-11-13 DIAGNOSIS — R110 Nausea: Secondary | ICD-10-CM

## 2020-11-13 DIAGNOSIS — Z1502 Genetic susceptibility to malignant neoplasm of ovary: Secondary | ICD-10-CM | POA: Diagnosis not present

## 2020-11-13 DIAGNOSIS — D649 Anemia, unspecified: Secondary | ICD-10-CM

## 2020-11-13 DIAGNOSIS — Z1509 Genetic susceptibility to other malignant neoplasm: Secondary | ICD-10-CM | POA: Diagnosis not present

## 2020-11-13 DIAGNOSIS — K2970 Gastritis, unspecified, without bleeding: Secondary | ICD-10-CM | POA: Diagnosis not present

## 2020-11-13 DIAGNOSIS — K319 Disease of stomach and duodenum, unspecified: Secondary | ICD-10-CM | POA: Diagnosis not present

## 2020-11-13 DIAGNOSIS — Z800 Family history of malignant neoplasm of digestive organs: Secondary | ICD-10-CM

## 2020-11-13 DIAGNOSIS — K2950 Unspecified chronic gastritis without bleeding: Secondary | ICD-10-CM | POA: Diagnosis not present

## 2020-11-13 DIAGNOSIS — Z1211 Encounter for screening for malignant neoplasm of colon: Secondary | ICD-10-CM | POA: Diagnosis not present

## 2020-11-13 DIAGNOSIS — K219 Gastro-esophageal reflux disease without esophagitis: Secondary | ICD-10-CM | POA: Diagnosis not present

## 2020-11-13 DIAGNOSIS — Z1501 Genetic susceptibility to malignant neoplasm of breast: Secondary | ICD-10-CM

## 2020-11-13 MED ORDER — SODIUM CHLORIDE 0.9 % IV SOLN: 500.0000 mL | Freq: Once | INTRAVENOUS | Status: DC

## 2020-11-13 NOTE — Progress Notes (Signed)
[place] - VS  Pt's states no medical or surgical changes since previsit or office visit. Maw

## 2020-11-13 NOTE — Progress Notes (Signed)
Called to room to assist during endoscopic procedure.  Patient ID and intended procedure confirmed with present staff. Received instructions for my participation in the procedure from the performing physician.  

## 2020-11-13 NOTE — Op Note (Signed)
[facility] Patient Name: Anna Valentine Procedure Date: 11/13/2020 11:41 AM MRN: [id number] Endoscopist: Remo Lipps P. Havery Moros , MD Age: 70 Referring MD:  Date of Birth: 31-Jan-1951 Gender: Female Account #: [id number] Procedure:                Colonoscopy Indications:              Screening patient at increased risk: Family history                            of 1st-degree relative with colorectal cancer                            (father dx age 21s), history of TP53 mutation /                            Li-Fraumeni syndrome Medicines:                Monitored Anesthesia Care Procedure:                Pre-Anesthesia Assessment:                           - Prior to the procedure, a History and Physical                            was performed, and patient medications and                            allergies were reviewed. The patient's tolerance of                            previous anesthesia was also reviewed. The risks                            and benefits of the procedure and the sedation                            options and risks were discussed with the patient.                            All questions were answered, and informed consent                            was obtained. Prior Anticoagulants: The patient has                            taken no previous anticoagulant or antiplatelet                            agents. ASA Grade Assessment: III - A patient with                            severe systemic disease. After reviewing the risks  and benefits, the patient was deemed in                            satisfactory condition to undergo the procedure.                           After obtaining informed consent, the colonoscope                            was passed under direct vision. Throughout the                            procedure, the patient's blood pressure, pulse, and                            oxygen saturations were monitored  continuously. The                            Olympus PCF-H190DL (#[id number]) Colonoscope was                            introduced through the anus and advanced to the the                            cecum, identified by appendiceal orifice and                            ileocecal valve. The colonoscopy was performed                            without difficulty. The patient tolerated the                            procedure well. The quality of the bowel                            preparation was good. The ileocecal valve,                            appendiceal orifice, and rectum were photographed. Scope In: 11:57:18 AM Scope Out: 12:16:31 PM Scope Withdrawal Time: 0 hours 14 minutes 14 seconds  Total Procedure Duration: 0 hours 19 minutes 13 seconds  Findings:                 The perianal and digital rectal examinations were                            normal.                           A single small angiodysplastic lesion was found in                            the cecum.  Internal hemorrhoids were found during                            retroflexion. The hemorrhoids were small.                           The exam was otherwise without abnormality. No                            polyps. Complications:            No immediate complications. Estimated blood loss:                            None. Estimated Blood Loss:     Estimated blood loss: none. Impression:               - A single colonic angiodysplastic lesion.                           - Internal hemorrhoids.                           - The examination was otherwise normal.                           - No polyps. Recommendation:           - Patient has a contact number available for                            emergencies. The signs and symptoms of potential                            delayed complications were discussed with the                            patient. Return to normal activities tomorrow.                             Written discharge instructions were provided to the                            patient.                           - Resume previous diet.                           - Continue present medications.                           - Repeat colonoscopy in 5 years for surveillance                            (unless Oncology requests sooner due to [facility]  mutation). The patient has had no polyps on her                            last 2 colonoscopies. Remo Lipps P. Genesis Paget, MD 11/13/2020 12:26:56 PM This report has been signed electronically.

## 2020-11-13 NOTE — Progress Notes (Signed)
A and O x3. Report to RN. Tolerated MAC anesthesia well.Teeth unchanged after procedure.

## 2020-11-13 NOTE — Patient Instructions (Signed)
Impression/Recommendations:  Hemorrhoid handout given to patient.  Resume previous diet. Continue present medications.  Continue Phenergan as needed if that helps symptoms.  Await pathology results.  Repeat colonoscopy in 5 years for surveillance, unless requested sooner by oncology.  YOU HAD AN ENDOSCOPIC PROCEDURE TODAY AT [facility]:   Refer to the procedure report that was given to you for any specific questions about what was found during the examination.  If the procedure report does not answer your questions, please call your gastroenterologist to clarify.  If you requested that your care partner not be given the details of your procedure findings, then the procedure report has been included in a sealed envelope for you to review at your convenience later.  YOU SHOULD EXPECT: Some feelings of bloating in the abdomen. Passage of more gas than usual.  Walking can help get rid of the air that was put into your GI tract during the procedure and reduce the bloating. If you had a lower endoscopy (such as a colonoscopy or flexible sigmoidoscopy) you may notice spotting of blood in your stool or on the toilet paper. If you underwent a bowel prep for your procedure, you may not have a normal bowel movement for a few days.  Please Note:  You might notice some irritation and congestion in your nose or some drainage.  This is from the oxygen used during your procedure.  There is no need for concern and it should clear up in a day or so.  SYMPTOMS TO REPORT IMMEDIATELY:   Following lower endoscopy (colonoscopy or flexible sigmoidoscopy):  Excessive amounts of blood in the stool  Significant tenderness or worsening of abdominal pains  Swelling of the abdomen that is new, acute  Fever of 100F or higher   Following upper endoscopy (EGD)  Vomiting of blood or coffee ground material  New chest pain or pain under the shoulder blades  Painful or persistently difficult  swallowing  New shortness of breath  Fever of 100F or higher  Black, tarry-looking stools  For urgent or emergent issues, a gastroenterologist can be reached at any hour by calling [phone]. Do not use MyChart messaging for urgent concerns.    DIET:  We do recommend a small meal at first, but then you may proceed to your regular diet.  Drink plenty of fluids but you should avoid alcoholic beverages for 24 hours.  ACTIVITY:  You should plan to take it easy for the rest of today and you should NOT DRIVE or use heavy machinery until tomorrow (because of the sedation medicines used during the test).    FOLLOW UP: Our staff will call the number listed on your records 48-72 hours following your procedure to check on you and address any questions or concerns that you may have regarding the information given to you following your procedure. If we do not reach you, we will leave a message.  We will attempt to reach you two times.  During this call, we will ask if you have developed any symptoms of COVID 19. If you develop any symptoms (ie: fever, flu-like symptoms, shortness of breath, cough etc.) before then, please call [phone].  If you test positive for Covid 19 in the 2 weeks post procedure, please call and report this information to [place].    If any biopsies were taken you will be contacted by phone or by letter within the next 1-3 weeks.  Please call us at [phone] if you have not  heard about the biopsies in 3 weeks.    SIGNATURES/CONFIDENTIALITY: You and/or your care partner have signed paperwork which will be entered into your electronic medical record.  These signatures attest to the fact that that the information above on your After Visit Summary has been reviewed and is understood.  Full responsibility of the confidentiality of this discharge information lies with you and/or your care-partner.

## 2020-11-13 NOTE — Op Note (Signed)
[facility] Patient Name: Anna Valentine Procedure Date: 11/13/2020 11:42 AM MRN: [id number] Endoscopist: Remo Lipps P. Havery Moros , MD Age: 70 Referring MD:  Date of Birth: [birth year] Gender: Female Account #: [id number] Procedure:                Upper GI endoscopy Indications:              Chronic Nausea - improved on phenergan, also with                            history of MAC on chronic antibiotics Medicines:                Monitored Anesthesia Care Procedure:                Pre-Anesthesia Assessment:                           - Prior to the procedure, a History and Physical                            was performed, and patient medications and                            allergies were reviewed. The patient's tolerance of                            previous anesthesia was also reviewed. The risks                            and benefits of the procedure and the sedation                            options and risks were discussed with the patient.                            All questions were answered, and informed consent                            was obtained. Prior Anticoagulants: The patient has                            taken no previous anticoagulant or antiplatelet                            agents. ASA Grade Assessment: III - A patient with                            severe systemic disease. After reviewing the risks                            and benefits, the patient was deemed in                            satisfactory condition to undergo the procedure.  After obtaining informed consent, the endoscope was                            passed under direct vision. Throughout the                            procedure, the patient's blood pressure, pulse, and                            oxygen saturations were monitored continuously. The                            Endoscope was introduced through the mouth, and                            advanced  to the second part of duodenum. The upper                            GI endoscopy was accomplished without difficulty.                            The patient tolerated the procedure well. Scope In: Scope Out: Findings:                 Esophagogastric landmarks were identified: the                            Z-line was found at 40 cm, the gastroesophageal                            junction was found at 40 cm and the upper extent of                            the gastric folds was found at 40 cm from the                            incisors.                           The exam of the esophagus was otherwise normal.                           Striped mildly erythematous mucosa was found in the                            gastric antrum.                           The exam of the stomach was otherwise normal.                           Biopsies were taken with a cold forceps in the  gastric body, at the incisura and in the gastric                            antrum for Helicobacter pylori testing.                           The duodenal bulb and second portion of the                            duodenum were normal. Complications:            No immediate complications. Estimated blood loss:                            Minimal. Estimated Blood Loss:     Estimated blood loss was minimal. Impression:               - Esophagogastric landmarks identified.                           - Normal esophagus otherwise                           - Erythematous mucosa in the antrum.                           - Normal stomach otherwise - biopsies taken to rule                            out H pylori                           - Normal duodenal bulb and second portion of the                            duodenum. Recommendation:           - Patient has a contact number available for                            emergencies. The signs and symptoms of potential                            delayed  complications were discussed with the                            patient. Return to normal activities tomorrow.                            Written discharge instructions were provided to the                            patient.                           - Resume previous diet.                           -  Continue present medications.                           - Continue phenergan as needed if that helps                            symptoms, which sounds significantly improved                            compared to previous clinic visit                           - Await pathology results. Remo Lipps P. Chandi Nicklin, MD 11/13/2020 12:30:18 PM This report has been signed electronically.

## 2020-11-14 ENCOUNTER — Telehealth: Payer: Self-pay | Admitting: Internal Medicine

## 2020-11-14 NOTE — Telephone Encounter (Signed)
Micro lab called to let us know that latest sputum specimen has isolated AFB+, which patient is colonized with MAI noted on 06/26/20. Defer to dr Megan Salon for further management.

## 2020-11-17 ENCOUNTER — Telehealth: Payer: Self-pay | Admitting: *Deleted

## 2020-11-17 NOTE — Telephone Encounter (Signed)
Patient picked up 2 bottles of clofazimine on 11/11/20. Next refill due around the end of July/beginning of August.

## 2020-11-17 NOTE — Telephone Encounter (Signed)
  Follow up Call-  Call back number 11/13/2020  Post procedure Call Back phone  # [phone] cell  Permission to leave phone message Yes  Some recent data might be hidden     Patient questions:  Do you have a fever, pain , or abdominal swelling? No. Pain Score  0 *  Have you tolerated food without any problems? Yes.    Have you been able to return to your normal activities? Yes.    Do you have any questions about your discharge instructions: Diet   No. Medications  No. Follow up visit  No.  Do you have questions or concerns about your Care? No.  Actions: * If pain score is 4 or above: No action needed, pain <4

## 2020-11-27 DIAGNOSIS — C50411 Malignant neoplasm of upper-outer quadrant of right female breast: Secondary | ICD-10-CM | POA: Diagnosis not present

## 2020-11-27 DIAGNOSIS — Z1501 Genetic susceptibility to malignant neoplasm of breast: Secondary | ICD-10-CM | POA: Diagnosis not present

## 2020-11-30 ENCOUNTER — Telehealth: Payer: Self-pay

## 2020-11-30 NOTE — Telephone Encounter (Signed)
Received voicemail from [facility] to relay urgent lab results for patient. Copy of results received via fax. Results are regarding mycobacteria culture from 11/11/20 "rare (1+) acid-fast bacilli seen using the fluorochrome method; DNA probe result positive for mycobacterium avium complex."  Results placed in provider box. Per Epic, results have been reviewed by provider.   Beryle Flock, RN

## 2020-12-24 ENCOUNTER — Other Ambulatory Visit: Payer: Self-pay

## 2020-12-24 ENCOUNTER — Encounter: Payer: Self-pay | Admitting: Internal Medicine

## 2020-12-24 ENCOUNTER — Other Ambulatory Visit: Payer: Self-pay | Admitting: Internal Medicine

## 2020-12-24 ENCOUNTER — Ambulatory Visit (INDEPENDENT_AMBULATORY_CARE_PROVIDER_SITE_OTHER): Payer: Medicare Other | Admitting: Internal Medicine

## 2020-12-24 DIAGNOSIS — A310 Pulmonary mycobacterial infection: Secondary | ICD-10-CM | POA: Diagnosis not present

## 2020-12-24 NOTE — Progress Notes (Signed)
[facility] for Infectious Disease  Patient Active Problem List   Diagnosis Date Noted   Mycobacterium avium infection ([place]) 02/11/2020    Priority: High   Hearing loss 11/04/2020    Priority: Medium   Intractable nausea and vomiting 02/20/2020    Priority: Medium   Weight loss, non-intentional 09/20/2019    Priority: Medium   Skin rash 02/28/2020   Hyponatremia    Dehydration with hyponatremia 02/20/2020   Low back pain 02/20/2020   Chronic diastolic CHF (congestive heart failure) ([place]) 02/20/2020   Essential hypertension 97/08/6376   Acute metabolic encephalopathy 58/85/0277   Right epiretinal membrane 01/30/2020   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   Nuclear sclerotic cataract of right eye 01/30/2020   Nuclear sclerotic cataract of left eye 01/30/2020   Iron deficiency anemia 01/20/2020   Malignant neoplasm of overlapping sites of right breast in female, estrogen receptor positive ([place]) 09/20/2019   Postprocedural pneumothorax    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   Port-A-Cath in place 04/26/2018   Genetic testing 02/20/2018   Family history of breast cancer    Family history of lung cancer    Personal history of lung cancer    Chest pain 11/28/2017   Abnormal CT of the chest 11/30/2016   Malignant neoplasm of upper-inner quadrant of left breast in female, estrogen receptor positive ([place]) 06/16/2014   Osteopenia 09/13/2013   Postmenopausal atrophic vaginitis 09/13/2013   Vitamin D deficiency 09/13/2013   Malignant neoplasm of lower lobe of right lung ([place]) 07/24/2012   Anxiety 03/11/2011   History of neutropenia 03/11/2011   Family history of colon cancer 03/11/2011   Bronchiectasis without complication ([place]) 41/28/7867    Patient's Medications  New Prescriptions   No medications on file  Previous Medications   ACYCLOVIR (ZOVIRAX) 400 MG TABLET    TAKE 1 TABLET BY MOUTH TWICE DAILY   ALBUTEROL (VENTOLIN HFA) 108 (90 BASE) MCG/ACT INHALER    Inhale 2  puffs into the lungs every 6 (six) hours as needed for wheezing or shortness of breath.   AZITHROMYCIN (ZITHROMAX) 500 MG TABLET    Take 500 mg by mouth daily.   CLOFAZIMINE POWD    100 mg by Does not apply route every morning.   ETHAMBUTOL (MYAMBUTOL) 400 MG TABLET    Take 2 tablets (800 mg total) by mouth daily.   FLUTICASONE (FLONASE) 50 MCG/ACT NASAL SPRAY       PROMETHAZINE (PHENERGAN) 12.5 MG TABLET    Take 1 tablet (12.5 mg total) by mouth every 8 (eight) hours as needed.   TAMOXIFEN (NOLVADEX) 20 MG TABLET    Take 1 tablet (20 mg total) by mouth at bedtime.  Modified Medications   No medications on file  Discontinued Medications   No medications on file    Subjective: Anna Valentine is in for her routine follow-up visit.  She started on her salvage regimen of azithromycin, clofazimine and ethambutol on 08/04/2020 for her progressive Mycobacterium avium pneumonia.  She did not tolerate rifampin as part of a 3 drug regimen consisting of azithromycin, rifampin and ethambutol last year.  Since starting her current regimen she is feeling much better.  Her chronic nausea is much better and is now only mild and intermittent.  She has had no problems tolerating her current regimen other than intermittent diarrhea 2-3 times each week.  It tends to occur in the morning after breakfast..  She has no cough and only rare sputum production.  Her appetite has improved  and she has been able to gain a few pounds.  However, she has not noted much improvement in her dyspnea on exertion.  Review of Systems: Review of Systems  Constitutional:  Negative for chills, diaphoresis, fever, malaise/fatigue and weight loss.  HENT:  Positive for hearing loss. Negative for tinnitus.   Respiratory:  Positive for sputum production and shortness of breath. Negative for cough, hemoptysis and wheezing.   Cardiovascular:  Negative for chest pain.  Gastrointestinal:  Positive for diarrhea and nausea. Negative for abdominal pain  and vomiting.  Skin:        No skin discoloration.   Past Medical History:  Diagnosis Date   Anemia    hx of IDA   BRCA negative 2011   BRCA I/ II negative   Breast cancer ([place]) 08/2009   LEFT=stage 2, rx with lumpectomy and xrt   Breast cancer ([place]) 2019   RIGHT   Bronchiectasis ([place])    Chronic diastolic CHF (congestive heart failure) ([place]) 02/20/2020   Constipation    Essential hypertension 02/20/2020   Family history of adverse reaction to anesthesia    My Sister has nausea   Family history of breast cancer    Family history of colon cancer    Family history of lung cancer    GERD (gastroesophageal reflux disease)    not presently having symptoms   Hypertension    Iatrogenic pneumothorax 02/06/2019   Lung cancer ([place]) 06/06/05   stage 1 poorly differentiated adenocarcinoma, s/p right lower lobectomy.   Mycobacterium avium complex ([place])    dx 2021   Osteopenia 09/13/2013   Osteoporosis    Personal history of lung cancer    Pneumonia    2007ish, walking pnemonia - 2009 ish   STD (sexually transmitted disease)    HSV   Vitamin D deficiency 09/13/2013    Social History   Tobacco Use   Smoking status: Former    Packs/day: 2.00    Years: 18.00    Pack years: 36.00    Types: Cigarettes    Quit date: 07/12/1987    Years since quitting: 33.4   Smokeless tobacco: Never  Vaping Use   Vaping Use: Never used  Substance Use Topics   Alcohol use: Yes    Comment: 1 time per month   Drug use: No    Family History  Problem Relation Age of Onset   Allergies Mother    Asthma Mother    Lung cancer Mother    Breast cancer Mother 36       recurrence age 82   Colon cancer Father 38   Colon polyps Father 40   Prostate cancer Brother 50   Breast cancer Sister 43       Recurrence age 75 BRCA negative   Colon polyps Sister    Leukemia Sister    Breast cancer Sister 62   Prostate cancer Brother 29   Breast cancer Maternal Grandmother 68   Colon cancer Maternal Aunt     Leukemia Maternal Grandfather    Lung cancer Maternal Aunt    Breast cancer Cousin    Esophageal cancer Neg Hx    Rectal cancer Neg Hx    Stomach cancer Neg Hx     Allergies  Allergen Reactions   Rifampin Nausea And Vomiting   Taxol [Paclitaxel] Anaphylaxis   Paclitaxel (Protein-Bound) Hives   Tape Itching   Abraxane [Paclitaxel Protein-Bound Part] Rash   Clarithromycin Rash    Objective: Vitals:   12/24/20  1112  BP: 133/87  Pulse: 82  Temp: (!) 97.4 F (36.3 C)  TempSrc: Oral  SpO2: 99%  Weight: 104 lb (47.2 kg)   Body mass index is 16.79 kg/m.  Physical Exam Constitutional:      Comments: Her spirits are good.  Her weight is up 8 pounds over the past 3 months.  Cardiovascular:     Rate and Rhythm: Normal rate and regular rhythm.     Heart sounds: No murmur heard. Pulmonary:     Effort: Pulmonary effort is normal.     Breath sounds: Rales present. No wheezing or rhonchi.     Comments: She has a few crackles in her right lung base posteriorly. Abdominal:     Palpations: Abdomen is soft.     Tenderness: There is no abdominal tenderness.  Skin:    Findings: No rash.  Psychiatric:        Mood and Affect: Mood normal.   Chest CT 10/02/2020    IMPRESSION: 1. Thrombus within the residual stump of the right lower lobe pulmonary artery, likely interval in situ thrombus as result of prior right lower lobectomy. No other signs of pulmonary embolus. 2. Postsurgical changes from right lower lobectomy, with progressive cavitary changes and bronchiectasis throughout the right lung consistent with sequela of atypical infection. 3. Stable nodular consolidation within the lingula, with increasing nodular airspace disease throughout the left lower lobe, also consistent with progressive atypical infection. 4. Stable small right pleural effusion. 5. Aortic Atherosclerosis (ICD10-I70.0). Coronary artery atherosclerosis.  Labs 11/11/2020 Sputum growing Mycobacterium avium  again Antibiotic susceptibilities are pending   Problem List Items Addressed This Visit       High   Mycobacterium avium infection ([place])    Although she remains short of breath and her most recent culture remains +3 months into her current antibiotic regimen, she is showing significant clinical improvement.  I talked to her about the possibility of adding aerosolized amikacin if she remains culture positive and her improvement stalls.  She will continue her 3 drug regimen for now and follow-up in 2 months.  She is scheduled for repeat chest CT scan in September.        Michel Bickers, MD [facility] [phone] pager   [phone] cell 12/24/2020, 11:58 AM

## 2020-12-24 NOTE — Assessment & Plan Note (Signed)
Although she remains short of breath and her most recent culture remains +3 months into her current antibiotic regimen, she is showing significant clinical improvement.  I talked to her about the possibility of adding aerosolized amikacin if she remains culture positive and her improvement stalls.  She will continue her 3 drug regimen for now and follow-up in 2 months.  She is scheduled for repeat chest CT scan in September.

## 2021-01-01 LAB — MYCOBACTERIA,CULT W/FLUOROCHROME SMEAR
MICRO NUMBER:: 11852961
SPECIMEN QUALITY:: ADEQUATE

## 2021-01-01 LAB — M. AVIUM MIC PANEL
AMIKACIN: 64
CIPROFLOXACIN: >8
CLARITHROMYCIN: 4
LINEZOLID: >32
MOXIFLOXACIN: 4
RIFABUTIN: 0.5
RIFAMPIN: >4
STREPTOMYCIN: >32

## 2021-01-10 DIAGNOSIS — Z20822 Contact with and (suspected) exposure to covid-19: Secondary | ICD-10-CM | POA: Diagnosis not present

## 2021-01-20 ENCOUNTER — Other Ambulatory Visit: Payer: Self-pay | Admitting: Internal Medicine

## 2021-01-21 DIAGNOSIS — H40013 Open angle with borderline findings, low risk, bilateral: Secondary | ICD-10-CM | POA: Diagnosis not present

## 2021-01-22 DIAGNOSIS — Z23 Encounter for immunization: Secondary | ICD-10-CM | POA: Diagnosis not present

## 2021-01-27 ENCOUNTER — Telehealth (HOSPITAL_COMMUNITY): Payer: Self-pay

## 2021-01-27 NOTE — Telephone Encounter (Signed)
Called patient to see if she was interested in participating in the Pulmonary Rehab Program. Patient stated yes. Patient will come in for orientation on 02/22/21 @ 9AM and will attend the 10:15AM exercise class.   Tourist information centre manager.

## 2021-02-01 ENCOUNTER — Encounter (INDEPENDENT_AMBULATORY_CARE_PROVIDER_SITE_OTHER): Payer: Medicare Other | Admitting: Ophthalmology

## 2021-02-08 ENCOUNTER — Other Ambulatory Visit: Payer: Self-pay

## 2021-02-08 ENCOUNTER — Encounter (HOSPITAL_COMMUNITY)
Admission: RE | Admit: 2021-02-08 | Discharge: 2021-02-08 | Disposition: A | Discharge status: Home / Self Care | Payer: Medicare Other | Source: Ambulatory Visit | Attending: Emergency Medicine | Admitting: Emergency Medicine

## 2021-02-08 VITALS — BP 102/60 | HR 90 | Ht 66.0 in | Wt 107.4 lb

## 2021-02-08 DIAGNOSIS — J479 Bronchiectasis, uncomplicated: Secondary | ICD-10-CM | POA: Insufficient documentation

## 2021-02-08 NOTE — Progress Notes (Signed)
Anna Valentine 70 y.o. female Pulmonary Rehab Orientation Note This patient who was referred to Pulmonary rehab by Dr. Lamonte Sakai with the diagnosis of bronchiectasis without complication arrived today in Cardiac and Pulmonary Rehab. She arrived ambulatory with normal gait. She does not carry portable oxygen. Per pt, she uses oxygen never. Color good, skin warm and dry. Patient is oriented to time and place. Patient's medical history, psychosocial health, and medications reviewed. Psychosocial assessment reveals pt lives alone. Pt is currently retired. Pt hobbies include reading. Pt reports her stress level is low. Areas of stress/anxiety include Health.  Pt does not exhibit signs of depression. PHQ2/9 score 0/0. Pt shows good  coping skills with positive outlook . Will continue to monitor and evaluate for psychosocial concerns.  Physical assessment reveals heart rate is normal, breath sounds clear to auscultation, no wheezes, rales, or rhonchi. Grip strength equal, strong. Patient reports she does take medications as prescribed. Patient states she follows a Regular diet. The patient has been trying to gain weight by eating 3 meals per day and by making milkshakes at home.. Patient's weight will be monitored closely. Demonstration and practice of PLB using pulse oximeter. Patient able to return demonstration satisfactorily. Safety and hand hygiene in the exercise area reviewed with patient. Patient voices understanding of the information reviewed. Department expectations discussed with patient and achievable goals were set. The patient shows enthusiasm about attending the program and we look forward to working with this nice lady. The patient completed a 6 min walk test today, 02/08/2021 and to begin exercise on Tuesday 02/16/2021 in the 10:15 am class.  [id number]

## 2021-02-08 NOTE — Progress Notes (Signed)
Pulmonary Individual Treatment Plan  Patient Details  Valentine: Anna Valentine MRN: [id number] Date of Birth: 1951/02/01 Referring Provider:    Initial Encounter Date:   Visit Diagnosis: Bronchiectasis without complication ([place])  Patient's Home Medications on Admission:   Current Outpatient Medications:    acyclovir (ZOVIRAX) 400 MG tablet, TAKE 1 TABLET BY MOUTH TWICE DAILY, Disp: 120 tablet, Rfl: 0   albuterol (VENTOLIN HFA) 108 (90 Base) MCG/ACT inhaler, INHALE 2 PUFFS INTO LUNGS EVERY 6 HOURS AS NEEDED FOR WHEEZING OR SHORTNESS OF BREATH., Disp: 8.5 g, Rfl: PRN   azithromycin (ZITHROMAX) 500 MG tablet, Take 500 mg by mouth daily., Disp: , Rfl:    Clofazimine POWD, 100 mg by Does not apply route every morning., Disp: 3000 g, Rfl: 11   ethambutol (MYAMBUTOL) 400 MG tablet, Take 2 tablets (800 mg total) by mouth daily., Disp: 60 tablet, Rfl: 11   promethazine (PHENERGAN) 12.5 MG tablet, Take 1 tablet (12.5 mg total) by mouth every 8 (eight) hours as needed., Disp: 30 tablet, Rfl: 1   tamoxifen (NOLVADEX) 20 MG tablet, Take 1 tablet (20 mg total) by mouth at bedtime., Disp: 90 tablet, Rfl: 4   fluticasone (FLONASE) 50 MCG/ACT nasal spray, , Disp: , Rfl:   Current Facility-Administered Medications:    0.9 %  sodium chloride infusion, 500 mL, Intravenous, Once, Armbruster, Carlota Raspberry, MD  Past Medical History: Past Medical History:  Diagnosis Date   Anemia    hx of IDA   BRCA negative 2011   BRCA I/ II negative   Breast cancer ([place]) 08/2009   LEFT=stage 2, rx with lumpectomy and xrt   Breast cancer ([place]) 2019   RIGHT   Bronchiectasis ([place])    Chronic diastolic CHF (congestive heart failure) ([place]) 02/20/2020   Constipation    Essential hypertension 02/20/2020   Family history of adverse reaction to anesthesia    My Sister has nausea   Family history of breast cancer    Family history of colon cancer    Family history of lung cancer    GERD (gastroesophageal reflux disease)    not  presently having symptoms   Hypertension    Iatrogenic pneumothorax 02/06/2019   Lung cancer ([place]) 06/06/05   stage 1 poorly differentiated adenocarcinoma, s/p right lower lobectomy.   Mycobacterium avium complex ([place])    dx 2021   Osteopenia 09/13/2013   Osteoporosis    Personal history of lung cancer    Pneumonia    2007ish, walking pnemonia - 2009 ish   STD (sexually transmitted disease)    HSV   Vitamin D deficiency 09/13/2013    Tobacco Use: Social History   Tobacco Use  Smoking Status Former   Packs/day: 2.00   Years: 18.00   Pack years: 36.00   Types: Cigarettes   Quit date: 07/12/1987   Years since quitting: 33.6  Smokeless Tobacco Never    Labs: Recent Review Flowsheet Data     Labs for ITP Cardiac and Pulmonary Rehab Latest Ref Rng & Units 02/28/2011 09/29/2014 11/07/2017 01/28/2019   Cholestrol <200 mg/dL 208(H) 205(H) 213(H) 191   LDLCALC mg/dL (calc) 119(H) 105(H) 125(H) 105(H)   HDL > OR = 50 mg/dL 80 90 73 71   Trlycerides <150 mg/dL 47 52 56 68       Capillary Blood Glucose: Lab Results  Component Value Date   GLUCAP 90 12/31/2019   GLUCAP 83 01/02/2019     Pulmonary Assessment Scores:  Pulmonary Assessment Scores  Anna Valentine 02/08/21 1007         ADL UCSD   ADL Phase Entry     SOB Score total 29           CAT Score     CAT Score 13           mMRC Score     mMRC Score 3            UCSD: Self-administered rating of dyspnea associated with activities of daily living (ADLs) 6-point scale (0 = "not at all" to 5 = "maximal or unable to do because of breathlessness")  Scoring Scores range from 0 to 120.  Minimally important difference is 5 units  CAT: CAT can identify the health impairment of COPD patients and is better correlated with disease progression.  CAT has a scoring range of zero to 40. The CAT score is classified into four groups of low (less than 10), medium (10 - 20), high (21-30) and very high (31-40) based on the impact  level of disease on health status. A CAT score over 10 suggests significant symptoms.  A worsening CAT score could be explained by an exacerbation, poor medication adherence, poor inhaler technique, or progression of COPD or comorbid conditions.  CAT MCID is 2 points  mMRC: mMRC (Modified Medical Research Council) Dyspnea Scale is used to assess the degree of baseline functional disability in patients of respiratory disease due to dyspnea. No minimal important difference is established. A decrease in score of 1 point or greater is considered a positive change.   Pulmonary Function Assessment:  Pulmonary Function Assessment - 02/08/21 0947       Breath   Bilateral Breath Sounds Clear    Shortness of Breath Yes;Limiting activity             Exercise Target Goals: Exercise Program Goal: Individual exercise prescription set using results from initial 6 min walk test and THRR while considering  patient's activity barriers and safety.   Exercise Prescription Goal: Initial exercise prescription builds to 30-45 minutes a day of aerobic activity, 2-3 days per week.  Home exercise guidelines will be given to patient during program as part of exercise prescription that the participant will acknowledge.  Activity Barriers & Risk Stratification:  Activity Barriers & Cardiac Risk Stratification - 02/08/21 0940       Activity Barriers & Cardiac Risk Stratification   Activity Barriers Deconditioning;Muscular Weakness;Shortness of Breath             6 Minute Walk:  6 Minute Walk     Row Valentine 02/08/21 1026         6 Minute Walk   Phase Initial     Distance 1360 feet     Walk Time 6 minutes     # of Rest Breaks 0     MPH 2.58     METS 3.5     RPE 11     Perceived Dyspnea  0     VO2 Peak 12.24     Symptoms No     Resting HR 81 bpm     Resting BP 102/60     Resting Oxygen Saturation  99 %     Exercise Oxygen Saturation  during 6 min walk 98 %     Max Ex. HR 105 bpm     Max  Ex. BP 110/60     2 Minute Post BP 108/60  Interval HR     1 Minute HR 95     2 Minute HR 102     3 Minute HR 105     4 Minute HR 101     5 Minute HR 100     6 Minute HR 100     2 Minute Post HR 86     Interval Heart Rate? Yes           Interval Oxygen     Interval Oxygen? Yes     Baseline Oxygen Saturation % 99 %     1 Minute Oxygen Saturation % 98 %     1 Minute Liters of Oxygen 0 L     2 Minute Oxygen Saturation % 100 %     2 Minute Liters of Oxygen 0 L     3 Minute Oxygen Saturation % 100 %     3 Minute Liters of Oxygen 0 L     4 Minute Oxygen Saturation % 100 %     4 Minute Liters of Oxygen 0 L     5 Minute Oxygen Saturation % 100 %     5 Minute Liters of Oxygen 0 L     6 Minute Oxygen Saturation % 99 %     6 Minute Liters of Oxygen 0 L     2 Minute Post Oxygen Saturation % 100 %     2 Minute Post Liters of Oxygen 0 L             Oxygen Initial Assessment:  Oxygen Initial Assessment - 02/08/21 0946       Home Oxygen   Home Oxygen Device None    Sleep Oxygen Prescription None    Home Exercise Oxygen Prescription None    Home Resting Oxygen Prescription None    Compliance with Home Oxygen Use Yes             Oxygen Re-Evaluation:   Oxygen Discharge (Final Oxygen Re-Evaluation):   Initial Exercise Prescription:   Perform Capillary Blood Glucose checks as needed.  Exercise Prescription Changes:   Exercise Comments:   Exercise Goals and Review:   Exercise Goals Re-Evaluation :   Discharge Exercise Prescription (Final Exercise Prescription Changes):   Nutrition:  Target Goals: Understanding of nutrition guidelines, daily intake of sodium <152m, cholesterol <2050m calories 30% from fat and 7% or less from saturated fats, daily to have 5 or more servings of fruits and vegetables.  Biometrics:  Pre Biometrics - 02/08/21 0945       Pre Biometrics   Height _0  (1.676 m)    Weight 48.7 kg    BMI (Calculated) 17.34     Grip Strength 21 kg              Nutrition Therapy Plan and Nutrition Goals:   Nutrition Assessments:  MEDIFICTS Score Key: ?70 Need to make dietary changes  40-70 Heart Healthy Diet ? 40 Therapeutic Level Cholesterol Diet   Picture Your Plate Scores: <4<42nhealthy dietary pattern with much room for improvement. 41-50 Dietary pattern unlikely to meet recommendations for good health and room for improvement. 51-60 More healthful dietary pattern, with some room for improvement.  >60 Healthy dietary pattern, although there may be some specific behaviors that could be improved.    Nutrition Goals Re-Evaluation:   Nutrition Goals Discharge (Final Nutrition Goals Re-Evaluation):   Psychosocial: Target Goals: Acknowledge presence or absence of significant depression and/or stress, maximize coping skills, provide positive support  system. Participant is able to verbalize types and ability to use techniques and skills needed for reducing stress and depression.  Initial Review & Psychosocial Screening:  Initial Psych Review & Screening - 02/08/21 0948       Initial Review   Current issues with None Identified      Family Dynamics   Good Support System? Yes   Lots of friends and church members     Barriers   Psychosocial barriers to participate in program There are no identifiable barriers or psychosocial needs.      Screening Interventions   Interventions Encouraged to exercise             Quality of Life Scores:  Scores of 19 and below usually indicate a poorer quality of life in these areas.  A difference of  2-3 points is a clinically meaningful difference.  A difference of 2-3 points in the total score of the Quality of Life Index has been associated with significant improvement in overall quality of life, self-image, physical symptoms, and general health in studies assessing change in quality of life.  PHQ-9: Recent Review Flowsheet Data     Depression  screen [facility] 2/9 02/08/2021 08/11/2020 07/22/2020 06/25/2020 04/23/2020   Decreased Interest 0 0 0 0 0   Down, Depressed, Hopeless 0 0 0 0 0   PHQ - 2 Score 0 0 0 0 0   Altered sleeping 0 - - - -   Tired, decreased energy 0 - - - -   Change in appetite 0 - - - -   Feeling bad or failure about yourself  0 - - - -   Trouble concentrating 0 - - - -   Moving slowly or fidgety/restless 0 - - - -   Suicidal thoughts 0 - - - -   Difficult doing work/chores Not difficult at all - - - -      Interpretation of Total Score  Total Score Depression Severity:  1-4 = Minimal depression, 5-9 = Mild depression, 10-14 = Moderate depression, 15-19 = Moderately severe depression, 20-27 = Severe depression   Psychosocial Evaluation and Intervention:  Psychosocial Evaluation - 02/08/21 0951       Psychosocial Evaluation & Interventions   Interventions Encouraged to exercise with the program and follow exercise prescription    Comments No concerns identified at this time.    Expected Outcomes To be free of psychosocial concerns while participating in pulmonary rehab.    Continue Psychosocial Services  No Follow up required             Psychosocial Re-Evaluation:   Psychosocial Discharge (Final Psychosocial Re-Evaluation):   Education: Education Goals: Education classes will be provided on a weekly basis, covering required topics. Participant will state understanding/return demonstration of topics presented.  Learning Barriers/Preferences:  Learning Barriers/Preferences - 02/08/21 1610       Learning Barriers/Preferences   Learning Barriers None    Learning Preferences Individual Instruction;Skilled Demonstration;Group Instruction;Written Material             Education Topics: Risk Factor Reduction:  -Group instruction that is supported by a PowerPoint presentation. Instructor discusses the definition of a risk factor, different risk factors for pulmonary disease, and how the heart and  lungs work together.     Nutrition for Pulmonary Patient:  -Group instruction provided by PowerPoint slides, verbal discussion, and written materials to support subject matter. The instructor gives an explanation and review of healthy diet recommendations, which includes a discussion  on weight management, recommendations for fruit and vegetable consumption, as well as protein, fluid, caffeine, fiber, sodium, sugar, and alcohol. Tips for eating when patients are short of breath are discussed.   Pursed Lip Breathing:  -Group instruction that is supported by demonstration and informational handouts. Instructor discusses the benefits of pursed lip and diaphragmatic breathing and detailed demonstration on how to preform both.     Oxygen Safety:  -Group instruction provided by PowerPoint, verbal discussion, and written material to support subject matter. There is an overview of "What is Oxygen" and "Why do we need it".  Instructor also reviews how to create a safe environment for oxygen use, the importance of using oxygen as prescribed, and the risks of noncompliance. There is a brief discussion on traveling with oxygen and resources the patient may utilize.   Oxygen Equipment:  -Group instruction provided by [facility] Staff utilizing handouts, written materials, and equipment demonstrations.   Signs and Symptoms:  -Group instruction provided by written material and verbal discussion to support subject matter. Warning signs and symptoms of infection, stroke, and heart attack are reviewed and when to call the physician/911 reinforced. Tips for preventing the spread of infection discussed.   Advanced Directives:  -Group instruction provided by verbal instruction and written material to support subject matter. Instructor reviews Advanced Directive laws and proper instruction for filling out document.   Pulmonary Video:  -Group video education that reviews the importance of medication and oxygen  compliance, exercise, good nutrition, pulmonary hygiene, and pursed lip and diaphragmatic breathing for the pulmonary patient.   Exercise for the Pulmonary Patient:  -Group instruction that is supported by a PowerPoint presentation. Instructor discusses benefits of exercise, core components of exercise, frequency, duration, and intensity of an exercise routine, importance of utilizing pulse oximetry during exercise, safety while exercising, and options of places to exercise outside of rehab.     Pulmonary Medications:  -Verbally interactive group education provided by instructor with focus on inhaled medications and proper administration.   Anatomy and Physiology of the Respiratory System and Intimacy:  -Group instruction provided by PowerPoint, verbal discussion, and written material to support subject matter. Instructor reviews respiratory cycle and anatomical components of the respiratory system and their functions. Instructor also reviews differences in obstructive and restrictive respiratory diseases with examples of each. Intimacy, Sex, and Sexuality differences are reviewed with a discussion on how relationships can change when diagnosed with pulmonary disease. Common sexual concerns are reviewed.   MD DAY -A group question and answer session with a medical doctor that allows participants to ask questions that relate to their pulmonary disease state.   OTHER EDUCATION -Group or individual verbal, written, or video instructions that support the educational goals of the pulmonary rehab program.   Holiday Eating Survival Tips:  -Group instruction provided by PowerPoint slides, verbal discussion, and written materials to support subject matter. The instructor gives patients tips, tricks, and techniques to help them not only survive but enjoy the holidays despite the onslaught of food that accompanies the holidays.   Knowledge Questionnaire Score:  Knowledge Questionnaire Score -  02/08/21 1008       Knowledge Questionnaire Score   Pre Score 18/18             Core Components/Risk Factors/Patient Goals at Admission:  Personal Goals and Risk Factors at Admission - 02/08/21 0952       Core Components/Risk Factors/Patient Goals on Admission    Weight Management Weight Gain    Improve shortness  of breath with ADL's Yes    Intervention Provide education, individualized exercise plan and daily activity instruction to help decrease symptoms of SOB with activities of daily living.    Expected Outcomes Short Term: Improve cardiorespiratory fitness to achieve a reduction of symptoms when performing ADLs;Long Term: Be able to perform more ADLs without symptoms or delay the onset of symptoms             Core Components/Risk Factors/Patient Goals Review:   Goals and Risk Factor Review     Row Valentine 02/08/21 0953             Core Components/Risk Factors/Patient Goals Review   Personal Goals Review Develop more efficient breathing techniques such as purse lipped breathing and diaphragmatic breathing and practicing self-pacing with activity.;Increase knowledge of respiratory medications and ability to use respiratory devices properly.;Improve shortness of breath with ADL's                Core Components/Risk Factors/Patient Goals at Discharge (Final Review):   Goals and Risk Factor Review - 02/08/21 0953       Core Components/Risk Factors/Patient Goals Review   Personal Goals Review Develop more efficient breathing techniques such as purse lipped breathing and diaphragmatic breathing and practicing self-pacing with activity.;Increase knowledge of respiratory medications and ability to use respiratory devices properly.;Improve shortness of breath with ADL's             ITP Comments:   Comments:

## 2021-02-10 DIAGNOSIS — Z20822 Contact with and (suspected) exposure to covid-19: Secondary | ICD-10-CM | POA: Diagnosis not present

## 2021-02-16 ENCOUNTER — Other Ambulatory Visit: Payer: Self-pay

## 2021-02-16 ENCOUNTER — Encounter (HOSPITAL_COMMUNITY)
Admission: RE | Admit: 2021-02-16 | Discharge: 2021-02-16 | Disposition: A | Discharge status: Home / Self Care | Payer: Medicare Other | Source: Ambulatory Visit | Attending: Emergency Medicine | Admitting: Emergency Medicine

## 2021-02-16 VITALS — Wt 108.9 lb

## 2021-02-16 DIAGNOSIS — J479 Bronchiectasis, uncomplicated: Secondary | ICD-10-CM

## 2021-02-16 NOTE — Progress Notes (Signed)
Daily Session Note  Patient Details  Name: Anna Valentine MRN: [id number] Date of Birth: 31-May-1951 Referring Provider:   April Manson Pulmonary Rehab Walk Test from 02/08/2021 in [place]  Referring Provider Dr. Lamonte Sakai       Encounter Date: 02/16/2021  Check In:  Session Check In - 02/16/21 1109       Check-In   Supervising physician immediately available to respond to emergencies [facility] immediately available    Physician(s) Dr Broadus John    Location [facility]    Staff Present Rosebud Poles, RN, BSN;Khian Remo Ysidro Evert, RN;Jessica Hassell Done, MS, ACSM-CEP, Exercise Physiologist    Virtual Visit No    Medication changes reported     No    Fall or balance concerns reported    No    Tobacco Cessation No Change    Warm-up and Cool-down Performed as group-led instruction    Resistance Training Performed Yes    VAD Patient? No    PAD/SET Patient? No      Pain Assessment   Currently in Pain? No/denies    Multiple Pain Sites No             Capillary Blood Glucose: No results found for this or any previous visit (from the past 24 hour(s)).   Exercise Prescription Changes - 02/16/21 1100       Response to Exercise   Blood Pressure (Admit) 120/84    Blood Pressure (Exercise) 114/64    Blood Pressure (Exit) 110/74    Heart Rate (Admit) 84 bpm    Heart Rate (Exercise) 112 bpm    Heart Rate (Exit) 92 bpm    Oxygen Saturation (Admit) 100 %    Oxygen Saturation (Exercise) 97 %    Oxygen Saturation (Exit) 98 %    Rating of Perceived Exertion (Exercise) 11    Perceived Dyspnea (Exercise) 1    Duration Continue with 30 min of aerobic exercise without signs/symptoms of physical distress.    Intensity Other (comment)   40-80% of HRR     Progression   Progression Continue to progress workloads to maintain intensity without signs/symptoms of physical distress.      Resistance Training   Training Prescription Yes    Weight Red  bands    Reps 10-15    Time 10 Minutes      Treadmill   MPH 1.8    Grade 0    Minutes 15      NuStep   Level 1    SPM 80    Minutes 15    METs 2             Social History   Tobacco Use  Smoking Status Former   Packs/day: 2.00   Years: 18.00   Pack years: 36.00   Types: Cigarettes   Quit date: 07/12/1987   Years since quitting: 33.6  Smokeless Tobacco Never    Goals Met:  Exercise tolerated well No report of cardiac concerns or symptoms Strength training completed today  Goals Unmet:  Not Applicable  Comments: Service time is from 1017 to 1140    Dr. Fransico Him is Medical Director for Cardiac Rehab at [facility].

## 2021-02-18 ENCOUNTER — Encounter (HOSPITAL_COMMUNITY)
Admission: RE | Admit: 2021-02-18 | Discharge: 2021-02-18 | Disposition: A | Discharge status: Home / Self Care | Payer: Medicare Other | Source: Ambulatory Visit | Attending: Emergency Medicine | Admitting: Emergency Medicine

## 2021-02-18 ENCOUNTER — Other Ambulatory Visit: Payer: Self-pay

## 2021-02-18 DIAGNOSIS — J479 Bronchiectasis, uncomplicated: Secondary | ICD-10-CM | POA: Diagnosis not present

## 2021-02-18 NOTE — Progress Notes (Addendum)
Anna Valentine 70 y.o. female Nutrition Note  Diagnosis: bronchiectasis without complications  Past Medical History:  Diagnosis Date   Anemia    hx of IDA   BRCA negative 2011   BRCA I/ II negative   Breast cancer ([place]) 08/2009   LEFT=stage 2, rx with lumpectomy and xrt   Breast cancer ([place]) 2019   RIGHT   Bronchiectasis ([place])    Chronic diastolic CHF (congestive heart failure) ([place]) 02/20/2020   Constipation    Essential hypertension 02/20/2020   Family history of adverse reaction to anesthesia    My Sister has nausea   Family history of breast cancer    Family history of colon cancer    Family history of lung cancer    GERD (gastroesophageal reflux disease)    not presently having symptoms   Hypertension    Iatrogenic pneumothorax 02/06/2019   Lung cancer ([place]) 06/06/05   stage 1 poorly differentiated adenocarcinoma, s/p right lower lobectomy.   Mycobacterium avium complex ([place])    dx 2021   Osteopenia 09/13/2013   Osteoporosis    Personal history of lung cancer    Pneumonia    2007ish, walking pnemonia - 2009 ish   STD (sexually transmitted disease)    HSV   Vitamin D deficiency 09/13/2013     Medications reviewed.   Current Outpatient Medications:    acyclovir (ZOVIRAX) 400 MG tablet, TAKE 1 TABLET BY MOUTH TWICE DAILY, Disp: 120 tablet, Rfl: 0   albuterol (VENTOLIN HFA) 108 (90 Base) MCG/ACT inhaler, INHALE 2 PUFFS INTO LUNGS EVERY 6 HOURS AS NEEDED FOR WHEEZING OR SHORTNESS OF BREATH., Disp: 8.5 g, Rfl: PRN   azithromycin (ZITHROMAX) 500 MG tablet, Take 500 mg by mouth daily., Disp: , Rfl:    Clofazimine POWD, 100 mg by Does not apply route every morning., Disp: 3000 g, Rfl: 11   ethambutol (MYAMBUTOL) 400 MG tablet, Take 2 tablets (800 mg total) by mouth daily., Disp: 60 tablet, Rfl: 11   fluticasone (FLONASE) 50 MCG/ACT nasal spray, , Disp: , Rfl:    promethazine (PHENERGAN) 12.5 MG tablet, Take 1 tablet (12.5 mg total) by mouth every 8 (eight) hours as needed.,  Disp: 30 tablet, Rfl: 1   tamoxifen (NOLVADEX) 20 MG tablet, Take 1 tablet (20 mg total) by mouth at bedtime., Disp: 90 tablet, Rfl: 4  Current Facility-Administered Medications:    0.9 %  sodium chloride infusion, 500 mL, Intravenous, Once, Armbruster, Carlota Raspberry, MD   Ht Readings from Last 1 Encounters:  02/08/21 _0  (1.676 m)     Wt Readings from Last 3 Encounters:  02/16/21 108 lb 14.5 oz (49.4 kg)  02/08/21 107 lb 5.8 oz (48.7 kg)  12/24/20 104 lb (47.2 kg)     There is no height or weight on file to calculate BMI.   Social History   Tobacco Use  Smoking Status Former   Packs/day: 2.00   Years: 18.00   Pack years: 36.00   Types: Cigarettes   Quit date: 07/12/1987   Years since quitting: 33.6  Smokeless Tobacco Never      Nutrition Note  Spoke with pt. Nutrition Plan and Nutrition Survey goals reviewed with pt.  Per nutrition survey, pt is not following a general healthful diet. Pt could use improvement. Pt is mainly concerned with weight gain. She states a year ago she could not eat and had lots 10-15 lbs. She has gained about 8 back. She would like to gain another 5-7 lbs. She  does not cook much. Her neighbor provides her evening meal. She eats 3 meals a day. She has tried to eat 6 small meals but had a hard time creating a habit of that. She prefers 3 meals. No nausea/vomiting. Occasional diarrhea but pt feels it is manageable.  Noted BMI 17.58 kg/m2 - underweight  Diet recall: Breakfast: cinnamon roll or bagel Lunch: Cheese/crackers or sometimes mcdonalds Dinner: meat or chicken, potatoes or rice, and beans or green beans.  Snacks: nuts or whole milk Fluids: coffee with breakfast, water, 1 soda daily   Pt expressed understanding of the information reviewed.    Nutrition Diagnosis  Underweight related to inadequate energy intake/poor appetite post cancer treatment as evidenced by a BMI 17.58 kg/m2  Nutrition Intervention Pt's individual nutrition plan  reviewed with pt. Benefits of adopting healthy diet reviewed with Picture My Plate survey   Pt given handouts for: ?High calorie, high protein NCM handout, recipes Continue client-centered nutrition education by RD, as part of interdisciplinary care.  Goal(s) Pt to identify food quantities necessary to achieve weight gain of 0.5 lb per week at graduation from pulmonary rehab.  Pt to build a healthy plate including vegetables, fruits, whole grains, and low-fat dairy products in a heart healthy meal plan. Pt to eat snacks/mini meals between meals such as 1 cup milk or 1 oz nuts   Plan:  Will provide client-centered nutrition education as part of interdisciplinary care Monitor and evaluate progress toward nutrition goal with team.   Michaele Offer, MS, RDN, LDN

## 2021-02-18 NOTE — Progress Notes (Signed)
Daily Session Note  Patient Details  Name: Anna Valentine MRN: [id number] Date of Birth: [birth year] Referring Provider:   April Manson Pulmonary Rehab Walk Test from 02/08/2021 in [place]  Referring Provider Dr. Lamonte Sakai       Encounter Date: 02/18/2021  Check In:  Session Check In - 02/18/21 1122       Check-In   Supervising physician immediately available to respond to emergencies [facility] immediately available    Physician(s) Dr. Alfredia Ferguson    Location [facility]    Staff Present Rosebud Poles, RN, Quentin Ore, MS, ACSM-CEP, Exercise Physiologist;Avnoor Koury Ysidro Evert, RN    Virtual Visit No    Medication changes reported     No    Fall or balance concerns reported    No    Tobacco Cessation No Change    Warm-up and Cool-down Performed as group-led instruction    Resistance Training Performed Yes    VAD Patient? No    PAD/SET Patient? No      Pain Assessment   Currently in Pain? No/denies    Multiple Pain Sites No             Capillary Blood Glucose: No results found for this or any previous visit (from the past 24 hour(s)).    Social History   Tobacco Use  Smoking Status Former   Packs/day: 2.00   Years: 18.00   Pack years: 36.00   Types: Cigarettes   Quit date: 07/12/1987   Years since quitting: 33.6  Smokeless Tobacco Never    Goals Met:  Exercise tolerated well No report of cardiac concerns or symptoms Strength training completed today  Goals Unmet:  Not Applicable  Comments: Service time is from 1015 to 1124    Dr. Fransico Him is Medical Director for Cardiac Rehab at [facility].

## 2021-02-22 ENCOUNTER — Ambulatory Visit (HOSPITAL_COMMUNITY): Payer: Medicare Other

## 2021-02-23 ENCOUNTER — Encounter (HOSPITAL_COMMUNITY)
Admission: RE | Admit: 2021-02-23 | Discharge: 2021-02-23 | Disposition: A | Discharge status: Home / Self Care | Payer: Medicare Other | Source: Ambulatory Visit | Attending: Emergency Medicine | Admitting: Emergency Medicine

## 2021-02-23 ENCOUNTER — Other Ambulatory Visit: Payer: Self-pay

## 2021-02-23 DIAGNOSIS — J479 Bronchiectasis, uncomplicated: Secondary | ICD-10-CM | POA: Diagnosis not present

## 2021-02-23 NOTE — Progress Notes (Signed)
Pulmonary Individual Treatment Plan  Patient Details  Name: Anna Valentine MRN: [id number] Date of Birth: 14-Apr-1951 Referring Provider:   April Manson Pulmonary Rehab Walk Test from 02/08/2021 in [place]  Referring Provider Dr. Lamonte Sakai       Initial Encounter Date:  Flowsheet Row Pulmonary Rehab Walk Test from 02/08/2021 in [place]  Date 02/15/21       Visit Diagnosis: Bronchiectasis without complication ([place])  Patient's Home Medications on Admission:   Current Outpatient Medications:    acyclovir (ZOVIRAX) 400 MG tablet, TAKE 1 TABLET BY MOUTH TWICE DAILY, Disp: 120 tablet, Rfl: 0   albuterol (VENTOLIN HFA) 108 (90 Base) MCG/ACT inhaler, INHALE 2 PUFFS INTO LUNGS EVERY 6 HOURS AS NEEDED FOR WHEEZING OR SHORTNESS OF BREATH., Disp: 8.5 g, Rfl: PRN   azithromycin (ZITHROMAX) 500 MG tablet, Take 500 mg by mouth daily., Disp: , Rfl:    Clofazimine POWD, 100 mg by Does not apply route every morning., Disp: 3000 g, Rfl: 11   ethambutol (MYAMBUTOL) 400 MG tablet, Take 2 tablets (800 mg total) by mouth daily., Disp: 60 tablet, Rfl: 11   fluticasone (FLONASE) 50 MCG/ACT nasal spray, , Disp: , Rfl:    promethazine (PHENERGAN) 12.5 MG tablet, Take 1 tablet (12.5 mg total) by mouth every 8 (eight) hours as needed., Disp: 30 tablet, Rfl: 1   tamoxifen (NOLVADEX) 20 MG tablet, Take 1 tablet (20 mg total) by mouth at bedtime., Disp: 90 tablet, Rfl: 4  Current Facility-Administered Medications:    0.9 %  sodium chloride infusion, 500 mL, Intravenous, Once, Armbruster, Carlota Raspberry, MD  Past Medical History: Past Medical History:  Diagnosis Date   Anemia    hx of IDA   BRCA negative 2011   BRCA I/ II negative   Breast cancer ([place]) 08/2009   LEFT=stage 2, rx with lumpectomy and xrt   Breast cancer ([place]) 2019   RIGHT   Bronchiectasis ([place])    Chronic diastolic CHF (congestive heart failure) ([place]) 02/20/2020   Constipation     Essential hypertension 02/20/2020   Family history of adverse reaction to anesthesia    My Sister has nausea   Family history of breast cancer    Family history of colon cancer    Family history of lung cancer    GERD (gastroesophageal reflux disease)    not presently having symptoms   Hypertension    Iatrogenic pneumothorax 02/06/2019   Lung cancer ([place]) 06/06/05   stage 1 poorly differentiated adenocarcinoma, s/p right lower lobectomy.   Mycobacterium avium complex ([place])    dx 2021   Osteopenia 09/13/2013   Osteoporosis    Personal history of lung cancer    Pneumonia    2007ish, walking pnemonia - 2009 ish   STD (sexually transmitted disease)    HSV   Vitamin D deficiency 09/13/2013    Tobacco Use: Social History   Tobacco Use  Smoking Status Former   Packs/day: 2.00   Years: 18.00   Pack years: 36.00   Types: Cigarettes   Quit date: 07/12/1987   Years since quitting: 33.6  Smokeless Tobacco Never    Labs: Recent Review Flowsheet Data     Labs for ITP Cardiac and Pulmonary Rehab Latest Ref Rng & Units 02/28/2011 09/29/2014 11/07/2017 01/28/2019   Cholestrol <200 mg/dL 208(H) 205(H) 213(H) 191   LDLCALC mg/dL (calc) 119(H) 105(H) 125(H) 105(H)   HDL > OR = 50 mg/dL 80 90 73 71  Trlycerides <150 mg/dL 47 52 56 68       Capillary Blood Glucose: Lab Results  Component Value Date   GLUCAP 90 12/31/2019   GLUCAP 83 01/02/2019     Pulmonary Assessment Scores:  Pulmonary Assessment Scores     Row Name 02/08/21 1007         ADL UCSD   ADL Phase Entry     SOB Score total 29           CAT Score   CAT Score 13           mMRC Score   mMRC Score 3             UCSD: Self-administered rating of dyspnea associated with activities of daily living (ADLs) 6-point scale (0 = "not at all" to 5 = "maximal or unable to do because of breathlessness")  Scoring Scores range from 0 to 120.  Minimally important difference is 5 units  CAT: CAT can identify the health  impairment of COPD patients and is better correlated with disease progression.  CAT has a scoring range of zero to 40. The CAT score is classified into four groups of low (less than 10), medium (10 - 20), high (21-30) and very high (31-40) based on the impact level of disease on health status. A CAT score over 10 suggests significant symptoms.  A worsening CAT score could be explained by an exacerbation, poor medication adherence, poor inhaler technique, or progression of COPD or comorbid conditions.  CAT MCID is 2 points  mMRC: mMRC (Modified Medical Research Council) Dyspnea Scale is used to assess the degree of baseline functional disability in patients of respiratory disease due to dyspnea. No minimal important difference is established. A decrease in score of 1 point or greater is considered a positive change.   Pulmonary Function Assessment:  Pulmonary Function Assessment - 02/08/21 0947       Breath   Bilateral Breath Sounds Clear    Shortness of Breath Yes;Limiting activity             Exercise Target Goals: Exercise Program Goal: Individual exercise prescription set using results from initial 6 min walk test and THRR while considering  patient's activity barriers and safety.   Exercise Prescription Goal: Initial exercise prescription builds to 30-45 minutes a day of aerobic activity, 2-3 days per week.  Home exercise guidelines will be given to patient during program as part of exercise prescription that the participant will acknowledge.  Activity Barriers & Risk Stratification:  Activity Barriers & Cardiac Risk Stratification - 02/08/21 0940       Activity Barriers & Cardiac Risk Stratification   Activity Barriers Deconditioning;Muscular Weakness;Shortness of Breath             6 Minute Walk:  6 Minute Walk     Row Name 02/08/21 1026         6 Minute Walk   Phase Initial     Distance 1360 feet     Walk Time 6 minutes     # of Rest Breaks 0     MPH 2.58      METS 3.5     RPE 11     Perceived Dyspnea  0     VO2 Peak 12.24     Symptoms No     Resting HR 81 bpm     Resting BP 102/60     Resting Oxygen Saturation  99 %     Exercise Oxygen  Saturation  during 6 min walk 98 %     Max Ex. HR 105 bpm     Max Ex. BP 110/60     2 Minute Post BP 108/60           Interval HR   1 Minute HR 95     2 Minute HR 102     3 Minute HR 105     4 Minute HR 101     5 Minute HR 100     6 Minute HR 100     2 Minute Post HR 86     Interval Heart Rate? Yes           Interval Oxygen   Interval Oxygen? Yes     Baseline Oxygen Saturation % 99 %     1 Minute Oxygen Saturation % 98 %     1 Minute Liters of Oxygen 0 L     2 Minute Oxygen Saturation % 100 %     2 Minute Liters of Oxygen 0 L     3 Minute Oxygen Saturation % 100 %     3 Minute Liters of Oxygen 0 L     4 Minute Oxygen Saturation % 100 %     4 Minute Liters of Oxygen 0 L     5 Minute Oxygen Saturation % 100 %     5 Minute Liters of Oxygen 0 L     6 Minute Oxygen Saturation % 99 %     6 Minute Liters of Oxygen 0 L     2 Minute Post Oxygen Saturation % 100 %     2 Minute Post Liters of Oxygen 0 L              Oxygen Initial Assessment:  Oxygen Initial Assessment - 02/08/21 0946       Home Oxygen   Home Oxygen Device None    Sleep Oxygen Prescription None    Home Exercise Oxygen Prescription None    Home Resting Oxygen Prescription None    Compliance with Home Oxygen Use Yes      Initial 6 min Walk   Oxygen Used None      Program Oxygen Prescription   Program Oxygen Prescription None      Intervention   Short Term Goals To learn and understand importance of monitoring SPO2 with pulse oximeter and demonstrate accurate use of the pulse oximeter.;To learn and understand importance of maintaining oxygen saturations>88%;To learn and demonstrate proper pursed lip breathing techniques or other breathing techniques. ;To learn and demonstrate proper use of respiratory medications     Long  Term Goals Verbalizes importance of monitoring SPO2 with pulse oximeter and return demonstration;Maintenance of O2 saturations>88%;Compliance with respiratory medication;Exhibits proper breathing techniques, such as pursed lip breathing or other method taught during program session;Demonstrates proper use of MDI's             Oxygen Re-Evaluation:   Oxygen Discharge (Final Oxygen Re-Evaluation):   Initial Exercise Prescription:  Initial Exercise Prescription - 02/15/21 0700       Date of Initial Exercise RX and Referring Provider   Date 02/15/21    Referring Provider Dr. Lamonte Sakai    Expected Discharge Date 04/15/21      NuStep   Level 1    SPM 80    Minutes 15      Track   Minutes 15      Prescription Details   Frequency (times per week)  2    Duration Progress to 30 minutes of continuous aerobic without signs/symptoms of physical distress      Intensity   THRR 40-80% of Max Heartrate 60-120    Ratings of Perceived Exertion 11-13    Perceived Dyspnea 0-4      Progression   Progression Continue to progress workloads to maintain intensity without signs/symptoms of physical distress.      Resistance Training   Training Prescription Yes    Weight red bands    Reps 10-15             Perform Capillary Blood Glucose checks as needed.  Exercise Prescription Changes:   Exercise Prescription Changes     Row Name 02/16/21 1100             Response to Exercise   Blood Pressure (Admit) 120/84       Blood Pressure (Exercise) 114/64       Blood Pressure (Exit) 110/74       Heart Rate (Admit) 84 bpm       Heart Rate (Exercise) 112 bpm       Heart Rate (Exit) 92 bpm       Oxygen Saturation (Admit) 100 %       Oxygen Saturation (Exercise) 97 %       Oxygen Saturation (Exit) 98 %       Rating of Perceived Exertion (Exercise) 11       Perceived Dyspnea (Exercise) 1       Duration Continue with 30 min of aerobic exercise without signs/symptoms of physical  distress.       Intensity Other (comment)  40-80% of HRR               Progression   Progression Continue to progress workloads to maintain intensity without signs/symptoms of physical distress.               Resistance Training   Training Prescription Yes       Weight Red bands       Reps 10-15       Time 10 Minutes               Treadmill   MPH 1.8       Grade 0       Minutes 15               NuStep   Level 1       SPM 80       Minutes 15       METs 2                Exercise Comments:   Exercise Goals and Review:   Exercise Goals     Row Name 02/15/21 0729 02/23/21 0922           Exercise Goals   Increase Physical Activity Yes Yes      Intervention Provide advice, education, support and counseling about physical activity/exercise needs.;Develop an individualized exercise prescription for aerobic and resistive training based on initial evaluation findings, risk stratification, comorbidities and participant's personal goals. Provide advice, education, support and counseling about physical activity/exercise needs.;Develop an individualized exercise prescription for aerobic and resistive training based on initial evaluation findings, risk stratification, comorbidities and participant's personal goals.      Expected Outcomes Short Term: Attend rehab on a regular basis to increase amount of physical activity.;Long Term: Add in home exercise to make exercise part of routine and to  increase amount of physical activity.;Long Term: Exercising regularly at least 3-5 days a week. Short Term: Attend rehab on a regular basis to increase amount of physical activity.;Long Term: Add in home exercise to make exercise part of routine and to increase amount of physical activity.;Long Term: Exercising regularly at least 3-5 days a week.      Increase Strength and Stamina Yes Yes      Intervention Provide advice, education, support and counseling about physical activity/exercise  needs.;Develop an individualized exercise prescription for aerobic and resistive training based on initial evaluation findings, risk stratification, comorbidities and participant's personal goals. Provide advice, education, support and counseling about physical activity/exercise needs.;Develop an individualized exercise prescription for aerobic and resistive training based on initial evaluation findings, risk stratification, comorbidities and participant's personal goals.      Expected Outcomes Short Term: Increase workloads from initial exercise prescription for resistance, speed, and METs.;Short Term: Perform resistance training exercises routinely during rehab and add in resistance training at home;Long Term: Improve cardiorespiratory fitness, muscular endurance and strength as measured by increased METs and functional capacity (6MWT) Short Term: Increase workloads from initial exercise prescription for resistance, speed, and METs.;Short Term: Perform resistance training exercises routinely during rehab and add in resistance training at home;Long Term: Improve cardiorespiratory fitness, muscular endurance and strength as measured by increased METs and functional capacity (6MWT)      Able to understand and use rate of perceived exertion (RPE) scale Yes Yes      Intervention Provide education and explanation on how to use RPE scale Provide education and explanation on how to use RPE scale      Expected Outcomes Short Term: Able to use RPE daily in rehab to express subjective intensity level;Long Term:  Able to use RPE to guide intensity level when exercising independently Short Term: Able to use RPE daily in rehab to express subjective intensity level;Long Term:  Able to use RPE to guide intensity level when exercising independently      Able to understand and use Dyspnea scale Yes Yes      Intervention Provide education and explanation on how to use Dyspnea scale Provide education and explanation on how to  use Dyspnea scale      Expected Outcomes Short Term: Able to use Dyspnea scale daily in rehab to express subjective sense of shortness of breath during exertion;Long Term: Able to use Dyspnea scale to guide intensity level when exercising independently Short Term: Able to use Dyspnea scale daily in rehab to express subjective sense of shortness of breath during exertion;Long Term: Able to use Dyspnea scale to guide intensity level when exercising independently      Knowledge and understanding of Target Heart Rate Range (THRR) Yes Yes      Intervention Provide education and explanation of THRR including how the numbers were predicted and where they are located for reference Provide education and explanation of THRR including how the numbers were predicted and where they are located for reference      Expected Outcomes Short Term: Able to state/look up THRR;Long Term: Able to use THRR to govern intensity when exercising independently;Short Term: Able to use daily as guideline for intensity in rehab Short Term: Able to state/look up THRR;Short Term: Able to use daily as guideline for intensity in rehab;Long Term: Able to use THRR to govern intensity when exercising independently      Understanding of Exercise Prescription Yes Yes      Intervention Provide education, explanation, and written materials on  patient's individual exercise prescription Provide education, explanation, and written materials on patient's individual exercise prescription      Expected Outcomes Short Term: Able to explain program exercise prescription;Long Term: Able to explain home exercise prescription to exercise independently Short Term: Able to explain program exercise prescription;Long Term: Able to explain home exercise prescription to exercise independently               Exercise Goals Re-Evaluation :  Exercise Goals Re-Evaluation     Row Name 02/23/21 0924             Exercise Goal Re-Evaluation   Exercise Goals  Review Increase Physical Activity;Increase Strength and Stamina;Able to understand and use rate of perceived exertion (RPE) scale;Able to understand and use Dyspnea scale;Knowledge and understanding of Target Heart Rate Range (THRR);Understanding of Exercise Prescription       Comments Anna Valentine has attended 2 exercise sessions. She is deconditioned but tolerates exercise well. Anna Valentine performs the warm up and resistance exercises standing up. Will continue to monitor and progress as necessary.       Expected Outcomes Through exercise at rehab and home, the pt will decrease SOB with daily activities and feel confident in carrying out an exercise regimen at home.                Discharge Exercise Prescription (Final Exercise Prescription Changes):  Exercise Prescription Changes - 02/16/21 1100       Response to Exercise   Blood Pressure (Admit) 120/84    Blood Pressure (Exercise) 114/64    Blood Pressure (Exit) 110/74    Heart Rate (Admit) 84 bpm    Heart Rate (Exercise) 112 bpm    Heart Rate (Exit) 92 bpm    Oxygen Saturation (Admit) 100 %    Oxygen Saturation (Exercise) 97 %    Oxygen Saturation (Exit) 98 %    Rating of Perceived Exertion (Exercise) 11    Perceived Dyspnea (Exercise) 1    Duration Continue with 30 min of aerobic exercise without signs/symptoms of physical distress.    Intensity Other (comment)   40-80% of HRR     Progression   Progression Continue to progress workloads to maintain intensity without signs/symptoms of physical distress.      Resistance Training   Training Prescription Yes    Weight Red bands    Reps 10-15    Time 10 Minutes      Treadmill   MPH 1.8    Grade 0    Minutes 15      NuStep   Level 1    SPM 80    Minutes 15    METs 2             Nutrition:  Target Goals: Understanding of nutrition guidelines, daily intake of sodium <1560m, cholesterol <2019m calories 30% from fat and 7% or less from saturated fats, daily to have 5  or more servings of fruits and vegetables.  Biometrics:  Pre Biometrics - 02/08/21 0945       Pre Biometrics   Height _0  (1.676 m)    Weight 48.7 kg    BMI (Calculated) 17.34    Grip Strength 21 kg              Nutrition Therapy Plan and Nutrition Goals:  Nutrition Therapy & Goals - 02/18/21 1321       Nutrition Therapy   Diet General Healthful      Personal Nutrition Goals   Nutrition Goal  Pt to identify food quantities necessary to achieve weight gain of 0.5 lb per week at graduation from pulmonary rehab.    Personal Goal #2 Pt to build a healthy plate including vegetables, fruits, whole grains, and low-fat dairy products in a heart healthy meal plan.    Personal Goal #3 Pt to eat snacks/mini meals between meals such as 1 cup milk or 1 oz nuts      Intervention Plan   Intervention Prescribe, educate and counsel regarding individualized specific dietary modifications aiming towards targeted core components such as weight, hypertension, lipid management, diabetes, heart failure and other comorbidities.;Nutrition handout(s) given to patient.    Expected Outcomes Long Term Goal: Adherence to prescribed nutrition plan.;Short Term Goal: A plan has been developed with personal nutrition goals set during dietitian appointment.             Nutrition Assessments:  MEDIFICTS Score Key: ?70 Need to make dietary changes  40-70 Heart Healthy Diet ? 40 Therapeutic Level Cholesterol Diet  Flowsheet Row PULMONARY REHAB OTHER RESPIRATORY from 02/18/2021 in [facility]  Picture Your Plate Total Score on Admission 42      Picture Your Plate Scores: <78 Unhealthy dietary pattern with much room for improvement. 41-50 Dietary pattern unlikely to meet recommendations for good health and room for improvement. 51-60 More healthful dietary pattern, with some room for improvement.  >60 Healthy dietary pattern, although there may be some specific  behaviors that could be improved.    Nutrition Goals Re-Evaluation:  Nutrition Goals Re-Evaluation     [place] Name 02/18/21 1322             Goals   Current Weight 108 lb (49 kg)       Nutrition Goal Pt to identify food quantities necessary to achieve weight gain of 0.5 lb per week at graduation from pulmonary rehab.               Personal Goal #2 Re-Evaluation   Personal Goal #2 Pt to build a healthy plate including vegetables, fruits, whole grains, and low-fat dairy products in a heart healthy meal plan.               Personal Goal #3 Re-Evaluation   Personal Goal #3 Pt to eat snacks/mini meals between meals such as 1 cup milk or 1 oz nuts                Nutrition Goals Discharge (Final Nutrition Goals Re-Evaluation):  Nutrition Goals Re-Evaluation - 02/18/21 1322       Goals   Current Weight 108 lb (49 kg)    Nutrition Goal Pt to identify food quantities necessary to achieve weight gain of 0.5 lb per week at graduation from pulmonary rehab.      Personal Goal #2 Re-Evaluation   Personal Goal #2 Pt to build a healthy plate including vegetables, fruits, whole grains, and low-fat dairy products in a heart healthy meal plan.      Personal Goal #3 Re-Evaluation   Personal Goal #3 Pt to eat snacks/mini meals between meals such as 1 cup milk or 1 oz nuts             Psychosocial: Target Goals: Acknowledge presence or absence of significant depression and/or stress, maximize coping skills, provide positive support system. Participant is able to verbalize types and ability to use techniques and skills needed for reducing stress and depression.  Initial Review & Psychosocial Screening:  Initial Psych Review &  Screening - 02/08/21 0948       Initial Review   Current issues with None Identified      Family Dynamics   Good Support System? Yes   Lots of friends and church members     Barriers   Psychosocial barriers to participate in program There are no identifiable  barriers or psychosocial needs.      Screening Interventions   Interventions Encouraged to exercise             Quality of Life Scores:  Scores of 19 and below usually indicate a poorer quality of life in these areas.  A difference of  2-3 points is a clinically meaningful difference.  A difference of 2-3 points in the total score of the Quality of Life Index has been associated with significant improvement in overall quality of life, self-image, physical symptoms, and general health in studies assessing change in quality of life.  PHQ-9: Recent Review Flowsheet Data     Depression screen [facility] 2/9 02/08/2021 08/11/2020 07/22/2020 06/25/2020 04/23/2020   Decreased Interest 0 0 0 0 0   Down, Depressed, Hopeless 0 0 0 0 0   PHQ - 2 Score 0 0 0 0 0   Altered sleeping 0 - - - -   Tired, decreased energy 0 - - - -   Change in appetite 0 - - - -   Feeling bad or failure about yourself  0 - - - -   Trouble concentrating 0 - - - -   Moving slowly or fidgety/restless 0 - - - -   Suicidal thoughts 0 - - - -   Difficult doing work/chores Not difficult at all - - - -      Interpretation of Total Score  Total Score Depression Severity:  1-4 = Minimal depression, 5-9 = Mild depression, 10-14 = Moderate depression, 15-19 = Moderately severe depression, 20-27 = Severe depression   Psychosocial Evaluation and Intervention:  Psychosocial Evaluation - 02/08/21 0951       Psychosocial Evaluation & Interventions   Interventions Encouraged to exercise with the program and follow exercise prescription    Comments No concerns identified at this time.    Expected Outcomes To be free of psychosocial concerns while participating in pulmonary rehab.    Continue Psychosocial Services  No Follow up required             Psychosocial Re-Evaluation:  Psychosocial Re-Evaluation     [place] Name 02/22/21 1247             Psychosocial Re-Evaluation   Current issues with None Identified        Comments No psychosocial concerns identified at this time, she has a positive attitude and seems to be enjoying exercising in pulmonary rehab.       Expected Outcomes For Anna Valentine to continue to be free of psychosocial concerns while participating in pulmonary rehab.       Interventions Encouraged to attend Pulmonary Rehabilitation for the exercise       Continue Psychosocial Services  No Follow up required                Psychosocial Discharge (Final Psychosocial Re-Evaluation):  Psychosocial Re-Evaluation - 02/22/21 1247       Psychosocial Re-Evaluation   Current issues with None Identified    Comments No psychosocial concerns identified at this time, she has a positive attitude and seems to be enjoying exercising in pulmonary rehab.    Expected Outcomes  For Anna Valentine to continue to be free of psychosocial concerns while participating in pulmonary rehab.    Interventions Encouraged to attend Pulmonary Rehabilitation for the exercise    Continue Psychosocial Services  No Follow up required             Education: Education Goals: Education classes will be provided on a weekly basis, covering required topics. Participant will state understanding/return demonstration of topics presented.  Learning Barriers/Preferences:  Learning Barriers/Preferences - 02/08/21 8938       Learning Barriers/Preferences   Learning Barriers None    Learning Preferences Individual Instruction;Skilled Demonstration;Group Instruction;Written Material             Education Topics: Risk Factor Reduction:  -Group instruction that is supported by a PowerPoint presentation. Instructor discusses the definition of a risk factor, different risk factors for pulmonary disease, and how the heart and lungs work together.     Nutrition for Pulmonary Patient:  -Group instruction provided by PowerPoint slides, verbal discussion, and written materials to support subject matter. The instructor gives an  explanation and review of healthy diet recommendations, which includes a discussion on weight management, recommendations for fruit and vegetable consumption, as well as protein, fluid, caffeine, fiber, sodium, sugar, and alcohol. Tips for eating when patients are short of breath are discussed.   Pursed Lip Breathing:  -Group instruction that is supported by demonstration and informational handouts. Instructor discusses the benefits of pursed lip and diaphragmatic breathing and detailed demonstration on how to preform both.     Oxygen Safety:  -Group instruction provided by PowerPoint, verbal discussion, and written material to support subject matter. There is an overview of "What is Oxygen" and "Why do we need it".  Instructor also reviews how to create a safe environment for oxygen use, the importance of using oxygen as prescribed, and the risks of noncompliance. There is a brief discussion on traveling with oxygen and resources the patient may utilize.   Oxygen Equipment:  -Group instruction provided by [facility] Staff utilizing handouts, written materials, and equipment demonstrations.   Signs and Symptoms:  -Group instruction provided by written material and verbal discussion to support subject matter. Warning signs and symptoms of infection, stroke, and heart attack are reviewed and when to call the physician/911 reinforced. Tips for preventing the spread of infection discussed.   Advanced Directives:  -Group instruction provided by verbal instruction and written material to support subject matter. Instructor reviews Advanced Directive laws and proper instruction for filling out document.   Pulmonary Video:  -Group video education that reviews the importance of medication and oxygen compliance, exercise, good nutrition, pulmonary hygiene, and pursed lip and diaphragmatic breathing for the pulmonary patient.   Exercise for the Pulmonary Patient:  -Group instruction that is  supported by a PowerPoint presentation. Instructor discusses benefits of exercise, core components of exercise, frequency, duration, and intensity of an exercise routine, importance of utilizing pulse oximetry during exercise, safety while exercising, and options of places to exercise outside of rehab.     Pulmonary Medications:  -Verbally interactive group education provided by instructor with focus on inhaled medications and proper administration.   Anatomy and Physiology of the Respiratory System and Intimacy:  -Group instruction provided by PowerPoint, verbal discussion, and written material to support subject matter. Instructor reviews respiratory cycle and anatomical components of the respiratory system and their functions. Instructor also reviews differences in obstructive and restrictive respiratory diseases with examples of each. Intimacy, Sex, and Sexuality differences are reviewed with a  discussion on how relationships can change when diagnosed with pulmonary disease. Common sexual concerns are reviewed. Flowsheet Row PULMONARY REHAB OTHER RESPIRATORY from 02/18/2021 in [place]  Date 02/18/21  Educator Handout       MD DAY -A group question and answer session with a medical doctor that allows participants to ask questions that relate to their pulmonary disease state.   OTHER EDUCATION -Group or individual verbal, written, or video instructions that support the educational goals of the pulmonary rehab program.   Holiday Eating Survival Tips:  -Group instruction provided by PowerPoint slides, verbal discussion, and written materials to support subject matter. The instructor gives patients tips, tricks, and techniques to help them not only survive but enjoy the holidays despite the onslaught of food that accompanies the holidays.   Knowledge Questionnaire Score:  Knowledge Questionnaire Score - 02/08/21 1008       Knowledge Questionnaire Score    Pre Score 18/18             Core Components/Risk Factors/Patient Goals at Admission:  Personal Goals and Risk Factors at Admission - 02/08/21 0952       Core Components/Risk Factors/Patient Goals on Admission    Weight Management Weight Gain    Improve shortness of breath with ADL's Yes    Intervention Provide education, individualized exercise plan and daily activity instruction to help decrease symptoms of SOB with activities of daily living.    Expected Outcomes Short Term: Improve cardiorespiratory fitness to achieve a reduction of symptoms when performing ADLs;Long Term: Be able to perform more ADLs without symptoms or delay the onset of symptoms             Core Components/Risk Factors/Patient Goals Review:   Goals and Risk Factor Review     Row Name 02/08/21 0953 02/22/21 1254           Core Components/Risk Factors/Patient Goals Review   Personal Goals Review Develop more efficient breathing techniques such as purse lipped breathing and diaphragmatic breathing and practicing self-pacing with activity.;Increase knowledge of respiratory medications and ability to use respiratory devices properly.;Improve shortness of breath with ADL's Develop more efficient breathing techniques such as purse lipped breathing and diaphragmatic breathing and practicing self-pacing with activity.;Increase knowledge of respiratory medications and ability to use respiratory devices properly.;Improve shortness of breath with ADL's      Review -- Anna Valentine has attended 2 exercise sessions, she tolerated them well, but it is too early to see progression toward goals yet.      Expected Outcomes -- See admission goals.               Core Components/Risk Factors/Patient Goals at Discharge (Final Review):   Goals and Risk Factor Review - 02/22/21 1254       Core Components/Risk Factors/Patient Goals Review   Personal Goals Review Develop more efficient breathing techniques such as purse  lipped breathing and diaphragmatic breathing and practicing self-pacing with activity.;Increase knowledge of respiratory medications and ability to use respiratory devices properly.;Improve shortness of breath with ADL's    Review Anna Valentine has attended 2 exercise sessions, she tolerated them well, but it is too early to see progression toward goals yet.    Expected Outcomes See admission goals.             ITP Comments:   Comments:  ITP REVIEW Pt is making expected progress toward pulmonary rehab goals after completing 2 sessions. Recommend continued exercise, life style modification, education, and  utilization of breathing techniques to increase stamina and strength and decrease shortness of breath with exertion.

## 2021-02-23 NOTE — Progress Notes (Signed)
Daily Session Note  Patient Details  Name: Anna Valentine MRN: [id number] Date of Birth: 03-27-1951 Referring Provider:   April Manson Pulmonary Rehab Walk Test from 02/08/2021 in [place]  Referring Provider Dr. Lamonte Sakai       Encounter Date: 02/23/2021  Check In:  Session Check In - 02/23/21 1120       Check-In   Supervising physician immediately available to respond to emergencies [facility] immediately available    Physician(s) Dr. Alfredia Ferguson    Location [facility]    Staff Present Rosebud Poles, RN, Quentin Ore, MS, ACSM-CEP, Exercise Physiologist;Jessica Hassell Done, MS, ACSM-CEP, Exercise Physiologist    Virtual Visit No    Medication changes reported     No    Fall or balance concerns reported    No    Tobacco Cessation No Change    Warm-up and Cool-down Performed as group-led instruction    Resistance Training Performed Yes    VAD Patient? No    PAD/SET Patient? No      Pain Assessment   Currently in Pain? No/denies    Multiple Pain Sites No             Capillary Blood Glucose: No results found for this or any previous visit (from the past 24 hour(s)).    Social History   Tobacco Use  Smoking Status Former   Packs/day: 2.00   Years: 18.00   Pack years: 36.00   Types: Cigarettes   Quit date: 07/12/1987   Years since quitting: 33.6  Smokeless Tobacco Never    Goals Met:  Exercise tolerated well No report of cardiac concerns or symptoms Strength training completed today  Goals Unmet:  Not Applicable  Comments: Service time is from 1020 to 1130    Dr. Fransico Him is Medical Director for Cardiac Rehab at [facility].

## 2021-02-25 ENCOUNTER — Ambulatory Visit (INDEPENDENT_AMBULATORY_CARE_PROVIDER_SITE_OTHER): Payer: Medicare Other | Admitting: Internal Medicine

## 2021-02-25 ENCOUNTER — Encounter: Payer: Self-pay | Admitting: Internal Medicine

## 2021-02-25 ENCOUNTER — Ambulatory Visit: Payer: Medicare Other | Admitting: Internal Medicine

## 2021-02-25 ENCOUNTER — Encounter (HOSPITAL_COMMUNITY)
Admission: RE | Admit: 2021-02-25 | Discharge: 2021-02-25 | Disposition: A | Discharge status: Home / Self Care | Payer: Medicare Other | Source: Ambulatory Visit | Attending: Emergency Medicine | Admitting: Emergency Medicine

## 2021-02-25 ENCOUNTER — Other Ambulatory Visit: Payer: Self-pay

## 2021-02-25 ENCOUNTER — Telehealth: Payer: Self-pay | Admitting: Pharmacist

## 2021-02-25 DIAGNOSIS — A310 Pulmonary mycobacterial infection: Secondary | ICD-10-CM | POA: Diagnosis not present

## 2021-02-25 DIAGNOSIS — J479 Bronchiectasis, uncomplicated: Secondary | ICD-10-CM

## 2021-02-25 NOTE — Progress Notes (Signed)
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    Priority: High   Hearing loss 11/04/2020    Priority: Medium   Intractable nausea and vomiting 02/20/2020    Priority: Medium   Weight loss, non-intentional 09/20/2019    Priority: Medium   Skin rash 02/28/2020   Hyponatremia    Dehydration with hyponatremia 02/20/2020   Low back pain 02/20/2020   Chronic diastolic CHF (congestive heart failure) ([place]) 02/20/2020   Essential hypertension 39/76/7341   Acute metabolic encephalopathy 93/79/0240   Right epiretinal membrane 01/30/2020   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   Nuclear sclerotic cataract of right eye 01/30/2020   Nuclear sclerotic cataract of left eye 01/30/2020   Iron deficiency anemia 01/20/2020   Malignant neoplasm of overlapping sites of right breast in female, estrogen receptor positive ([place]) 09/20/2019   Postprocedural pneumothorax    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   Port-A-Cath in place 04/26/2018   Genetic testing 02/20/2018   Family history of breast cancer    Family history of lung cancer    Personal history of lung cancer    Chest pain 11/28/2017   Abnormal CT of the chest 11/30/2016   Malignant neoplasm of upper-inner quadrant of left breast in female, estrogen receptor positive ([place]) 06/16/2014   Osteopenia 09/13/2013   Postmenopausal atrophic vaginitis 09/13/2013   Vitamin D deficiency 09/13/2013   Malignant neoplasm of lower lobe of right lung ([place]) 07/24/2012   Anxiety 03/11/2011   History of neutropenia 03/11/2011   Family history of colon cancer 03/11/2011   Bronchiectasis without complication ([place]) 97/35/3299    Patient's Medications  New Prescriptions   No medications on file  Previous Medications   ACYCLOVIR (ZOVIRAX) 400 MG TABLET    TAKE 1 TABLET BY MOUTH TWICE DAILY   ALBUTEROL (VENTOLIN HFA) 108 (90 BASE) MCG/ACT INHALER    INHALE 2  PUFFS INTO LUNGS EVERY 6 HOURS AS NEEDED FOR WHEEZING OR SHORTNESS OF BREATH.   AZITHROMYCIN (ZITHROMAX) 500 MG TABLET    Take 500 mg by mouth daily.   CLOFAZIMINE POWD    100 mg by Does not apply route every morning.   ETHAMBUTOL (MYAMBUTOL) 400 MG TABLET    Take 2 tablets (800 mg total) by mouth daily.   FLUTICASONE (FLONASE) 50 MCG/ACT NASAL SPRAY       MULTIPLE VITAMINS-MINERALS (ONE-A-DAY WOMENS PO)    Take by mouth.   PROMETHAZINE (PHENERGAN) 12.5 MG TABLET    Take 1 tablet (12.5 mg total) by mouth every 8 (eight) hours as needed.   TAMOXIFEN (NOLVADEX) 20 MG TABLET    Take 1 tablet (20 mg total) by mouth at bedtime.  Modified Medications   No medications on file  Discontinued Medications   No medications on file    Subjective: Odie is in for her routine follow-up visit.  She started on azithromycin, clofazimine and ethambutol in January for her smoldering Mycobacterium avium pneumonia.  She has not tolerated a rifampin based regimen last year.  She has not had any problems tolerating her current medications other than some loose stool.  She will generally have 1 bowel movement each morning after breakfast.  She has not had any nausea or vomiting.  She has not had any fever, visual changes or noted any changes in her skin pigmentation.  Overall she is feeling much better since starting her antibiotics.  Her appetite has improved and  she is gaining weight.  Her cough has decreased considerably and she rarely brings up any sputum.  She has not had any hemoptysis.  She says that she has not noted any improvement in her shortness of breath.  She says she gets short of breath when climbing stairs or bending over.  She recently started pulmonary rehab and says that her oxygen saturation has been normal.  She has not checked her oxygen saturation at home when she feels short of breath.  Review of Systems: Review of Systems  Constitutional:  Negative for chills, fever and weight loss.   Respiratory:  Positive for cough, sputum production and shortness of breath. Negative for hemoptysis and wheezing.   Cardiovascular:  Negative for chest pain.  Gastrointestinal:  Positive for diarrhea. Negative for abdominal pain, nausea and vomiting.  Skin:  Negative for rash.   Past Medical History:  Diagnosis Date   Anemia    hx of IDA   BRCA negative 2011   BRCA I/ II negative   Breast cancer ([place]) 08/2009   LEFT=stage 2, rx with lumpectomy and xrt   Breast cancer ([place]) 2019   RIGHT   Bronchiectasis ([place])    Chronic diastolic CHF (congestive heart failure) ([place]) 02/20/2020   Constipation    Essential hypertension 02/20/2020   Family history of adverse reaction to anesthesia    My Sister has nausea   Family history of breast cancer    Family history of colon cancer    Family history of lung cancer    GERD (gastroesophageal reflux disease)    not presently having symptoms   Hypertension    Iatrogenic pneumothorax 02/06/2019   Lung cancer ([place]) 06/06/05   stage 1 poorly differentiated adenocarcinoma, s/p right lower lobectomy.   Mycobacterium avium complex ([place])    dx 2021   Osteopenia 09/13/2013   Osteoporosis    Personal history of lung cancer    Pneumonia    2007ish, walking pnemonia - 2009 ish   STD (sexually transmitted disease)    HSV   Vitamin D deficiency 09/13/2013    Social History   Tobacco Use   Smoking status: Former    Packs/day: 2.00    Years: 18.00    Pack years: 36.00    Types: Cigarettes    Quit date: 07/12/1987    Years since quitting: 33.6   Smokeless tobacco: Never  Vaping Use   Vaping Use: Never used  Substance Use Topics   Alcohol use: Yes    Comment: 1 time per month   Drug use: No    Family History  Problem Relation Age of Onset   Allergies Mother    Asthma Mother    Lung cancer Mother    Breast cancer Mother 82       recurrence age 49   Colon cancer Father 90   Colon polyps Father 54   Prostate cancer Brother 30   Breast cancer  Sister 26       Recurrence age 67 BRCA negative   Colon polyps Sister    Leukemia Sister    Breast cancer Sister 70   Prostate cancer Brother 55   Breast cancer Maternal Grandmother 22   Colon cancer Maternal Aunt    Leukemia Maternal Grandfather    Lung cancer Maternal Aunt    Breast cancer Cousin    Esophageal cancer Neg Hx    Rectal cancer Neg Hx    Stomach cancer Neg Hx     Allergies  Allergen Reactions   Rifampin Nausea And Vomiting   Taxol [Paclitaxel] Anaphylaxis   Paclitaxel (Protein-Bound) Hives   Tape Itching   Abraxane [Paclitaxel Protein-Bound Part] Rash   Clarithromycin Rash    Objective: Vitals:   02/25/21 1131  BP: 111/75  Pulse: 88  Temp: (!) 97.4 F (36.3 C)  TempSrc: Oral  SpO2: 99%  Weight: 108 lb (49 kg)   Body mass index is 17.43 kg/m.  Physical Exam Constitutional:      Comments: Her spirits are good.  Her weight is up 12 pounds in the last 8 months.  Cardiovascular:     Rate and Rhythm: Normal rate and regular rhythm.     Heart sounds: No murmur heard. Pulmonary:     Effort: Pulmonary effort is normal.     Breath sounds: No wheezing, rhonchi or rales.  Skin:    Findings: No rash.  Psychiatric:        Mood and Affect: Mood normal.    Lab Results    Problem List Items Addressed This Visit       High   Mycobacterium avium infection ([place])    She is improving clinically on therapy for Mycobacterium avium pneumonia.  Her last sputum culture in May was still positive.  I asked her to bring in another sputum sample for repeat culture.  She will get blood work today and follow-up in 6 weeks after her upcoming repeat chest CT.      Relevant Orders   CBC   Comprehensive metabolic panel   MYCOBACTERIA, CULTURE, WITH FLUOROCHROME SMEAR     Michel Bickers, MD [facility] [place] [phone] pager   [phone] cell 02/25/2021, 12:00 PM

## 2021-02-25 NOTE — Telephone Encounter (Signed)
Patient picked up 2 bottles of clofazimine today at her appointment.

## 2021-02-25 NOTE — Assessment & Plan Note (Signed)
She is improving clinically on therapy for Mycobacterium avium pneumonia.  Her last sputum culture in May was still positive.  I asked her to bring in another sputum sample for repeat culture.  She will get blood work today and follow-up in 6 weeks after her upcoming repeat chest CT.

## 2021-02-25 NOTE — Telephone Encounter (Signed)
Received a message from patient stating she is ready to schedule her CT scan in September 2022.   PCCs, can you all please advise? Thank you!

## 2021-02-25 NOTE — Progress Notes (Signed)
Daily Session Note  Patient Details  Name: Anna Valentine MRN: [id number] Date of Birth: 30-Aug-1950 Referring Provider:   April Manson Pulmonary Rehab Walk Test from 02/08/2021 in [place]  Referring Provider Dr. Lamonte Sakai       Encounter Date: 02/25/2021  Check In:  Session Check In - 02/25/21 1108       Check-In   Supervising physician immediately available to respond to emergencies [facility] immediately available    Physician(s) Dr. Mal Misty    [facility]    Staff Present Trish Fountain, RN, Quentin Ore, MS, ACSM-CEP, Exercise Physiologist;David Lilyan Punt, MS, ACSM-CEP, CCRP, Exercise Physiologist;Carlette Wilber Oliphant, RN, BSN    Virtual Visit No    Medication changes reported     No    Fall or balance concerns reported    No    Tobacco Cessation No Change    Warm-up and Cool-down Performed as group-led instruction    Resistance Training Performed No    VAD Patient? No    PAD/SET Patient? No      Pain Assessment   Currently in Pain? No/denies    Multiple Pain Sites No             Capillary Blood Glucose: No results found for this or any previous visit (from the past 24 hour(s)).    Social History   Tobacco Use  Smoking Status Former   Packs/day: 2.00   Years: 18.00   Pack years: 36.00   Types: Cigarettes   Quit date: 07/12/1987   Years since quitting: 33.6  Smokeless Tobacco Never    Goals Met:  Proper associated with RPD/PD & O2 Sat Exercise tolerated well No report of cardiac concerns or symptoms Strength training completed today  Goals Unmet:  Not Applicable  Comments: Service time is from 1015 to 1125    Dr. Fransico Him is Medical Director for Cardiac Rehab at [facility].

## 2021-02-26 ENCOUNTER — Telehealth: Payer: Self-pay

## 2021-02-26 ENCOUNTER — Other Ambulatory Visit: Payer: Self-pay

## 2021-02-26 LAB — COMPREHENSIVE METABOLIC PANEL
AG Ratio: 1.3 (calc) (ref 1.0–2.5)
ALT: 11 U/L (ref 6–29)
AST: 18 U/L (ref 10–35)
Albumin: 3.5 g/dL — ABNORMAL LOW (ref 3.6–5.1)
Alkaline phosphatase (APISO): 60 U/L (ref 37–153)
BUN: 16 mg/dL (ref 7–25)
CO2: 25 mmol/L (ref 20–32)
Calcium: 9.1 mg/dL (ref 8.6–10.4)
Chloride: 104 mmol/L (ref 98–110)
Creat: 0.79 mg/dL (ref 0.50–1.05)
Globulin: 2.8 g/dL (calc) (ref 1.9–3.7)
Glucose, Bld: 81 mg/dL (ref 65–99)
Potassium: 4.3 mmol/L (ref 3.5–5.3)
Sodium: 137 mmol/L (ref 135–146)
Total Bilirubin: 0.3 mg/dL (ref 0.2–1.2)
Total Protein: 6.3 g/dL (ref 6.1–8.1)

## 2021-02-26 LAB — CBC
HCT: 32.7 % — ABNORMAL LOW (ref 35.0–45.0)
Hemoglobin: 11 g/dL — ABNORMAL LOW (ref 11.7–15.5)
MCH: 30.1 pg (ref 27.0–33.0)
MCHC: 33.6 g/dL (ref 32.0–36.0)
MCV: 89.3 fL (ref 80.0–100.0)
MPV: 9.8 fL (ref 7.5–12.5)
Platelets: 259 10*3/uL (ref 140–400)
RBC: 3.66 10*6/uL — ABNORMAL LOW (ref 3.80–5.10)
RDW: 13.1 % (ref 11.0–15.0)
WBC: 4 10*3/uL (ref 3.8–10.8)

## 2021-02-26 NOTE — Telephone Encounter (Signed)
Error

## 2021-02-26 NOTE — Telephone Encounter (Signed)
I have the order to schedule for Sept.  I just looked at the order while on the phone with scheduler and it was put in as CT angio - per RB's note it should be CT chest with contrast.  Vivien Rota told me I would have to get nurse to correct the order or put in new order - she can't change it.  I have sent Estill Bamberg a message asking for order to be corrected.

## 2021-02-26 NOTE — Telephone Encounter (Signed)
Checked with Estill Bamberg and based on RB's note she wants to verify with him which CT he wants before we schedule this.

## 2021-03-02 ENCOUNTER — Ambulatory Visit (HOSPITAL_COMMUNITY): Payer: Medicare Other

## 2021-03-02 ENCOUNTER — Other Ambulatory Visit: Payer: Self-pay

## 2021-03-02 ENCOUNTER — Encounter (HOSPITAL_COMMUNITY)
Admission: RE | Admit: 2021-03-02 | Discharge: 2021-03-02 | Disposition: A | Discharge status: Home / Self Care | Payer: Medicare Other | Source: Ambulatory Visit | Attending: Emergency Medicine | Admitting: Emergency Medicine

## 2021-03-02 VITALS — Wt 109.8 lb

## 2021-03-02 DIAGNOSIS — J479 Bronchiectasis, uncomplicated: Secondary | ICD-10-CM | POA: Diagnosis not present

## 2021-03-02 NOTE — Progress Notes (Signed)
Daily Session Note  Patient Details  Name: RUPAL CHILDRESS MRN: [id number] Date of Birth: 10-Nov-1950 Referring Provider:   April Manson Pulmonary Rehab Walk Test from 02/08/2021 in [place]  Referring Provider Dr. Lamonte Sakai       Encounter Date: 03/02/2021  Check In:  Session Check In - 03/02/21 1149       Check-In   Supervising physician immediately available to respond to emergencies [facility] immediately available    Physician(s) Tennis Must. [facility]    [facility]    Staff Present Leda Roys, MS, ACSM-CEP, Exercise Physiologist;Ysabel Stankovich Rosana Hoes, MS, ACSM-CEP, Exercise Physiologist;Lisa Ysidro Evert, RN    Virtual Visit No    Medication changes reported     No    Fall or balance concerns reported    No    Tobacco Cessation No Change    Warm-up and Cool-down Performed as group-led instruction    Resistance Training Performed Yes    VAD Patient? No    PAD/SET Patient? No      VAD patient   Has back up controller? No    Has spare charged batteries? No    Has compatible battery clips? No      Pain Assessment   Currently in Pain? No/denies    Multiple Pain Sites No             Capillary Blood Glucose: No results found for this or any previous visit (from the past 24 hour(s)).   Exercise Prescription Changes - 03/02/21 1200       Response to Exercise   Blood Pressure (Admit) 118/76    Blood Pressure (Exercise) 112/80    Blood Pressure (Exit) 102/70    Heart Rate (Admit) 86 bpm    Heart Rate (Exercise) 109 bpm    Heart Rate (Exit) 88 bpm    Oxygen Saturation (Admit) 99 %    Oxygen Saturation (Exercise) 96 %    Oxygen Saturation (Exit) 99 %    Rating of Perceived Exertion (Exercise) 11    Perceived Dyspnea (Exercise) 1    Duration Continue with 30 min of aerobic exercise without signs/symptoms of physical distress.    Intensity THRR unchanged      Progression   Progression Continue to progress workloads to  maintain intensity without signs/symptoms of physical distress.      Resistance Training   Training Prescription Yes    Weight Red bands    Reps 10-15    Time 10 Minutes      Treadmill   MPH 2    Grade 1.5    Minutes 15      NuStep   Level 1    SPM 80    Minutes 15    METs 2.6             Social History   Tobacco Use  Smoking Status Former   Packs/day: 2.00   Years: 18.00   Pack years: 36.00   Types: Cigarettes   Quit date: 07/12/1987   Years since quitting: 33.6  Smokeless Tobacco Never    Goals Met:  Proper associated with RPD/PD & O2 Sat Exercise tolerated well No report of cardiac concerns or symptoms Strength training completed today  Goals Unmet:  Not Applicable  Comments: Service time is from 1014 to 1137    Dr. Fransico Him is Medical Director for Cardiac Rehab at [facility].

## 2021-03-04 ENCOUNTER — Encounter (HOSPITAL_COMMUNITY)
Admission: RE | Admit: 2021-03-04 | Discharge: 2021-03-04 | Disposition: A | Discharge status: Home / Self Care | Payer: Medicare Other | Source: Ambulatory Visit | Attending: Emergency Medicine | Admitting: Emergency Medicine

## 2021-03-04 ENCOUNTER — Ambulatory Visit (HOSPITAL_COMMUNITY): Payer: Medicare Other

## 2021-03-04 ENCOUNTER — Other Ambulatory Visit: Payer: Self-pay

## 2021-03-04 DIAGNOSIS — J479 Bronchiectasis, uncomplicated: Secondary | ICD-10-CM

## 2021-03-04 NOTE — Progress Notes (Signed)
Daily Session Note  Patient Details  Name: Anna Valentine MRN: [id number] Date of Birth: [birth year] Referring Provider:   April Manson Pulmonary Rehab Walk Test from 02/08/2021 in [facility]  Referring Provider Dr. Lamonte Sakai       Encounter Date: 03/04/2021  Check In:  Session Check In - 03/04/21 1114       Check-In   Supervising physician immediately available to respond to emergencies [facility] immediately available    Physician(s) Dr. Sloan Leiter    Location [facility]    Staff Present Lesly Rubenstein, MS, ACSM-CEP, CCRP, Exercise Physiologist;Jessica Hassell Done, MS, ACSM-CEP, Exercise Physiologist;Lisa Clarisa Schools, MS, ACSM-CEP, Exercise Physiologist    Virtual Visit No    Medication changes reported     No    Fall or balance concerns reported    No    Tobacco Cessation No Change    Warm-up and Cool-down Performed as group-led instruction    Resistance Training Performed Yes    VAD Patient? No    PAD/SET Patient? No      VAD patient   Has back up controller? No    Has spare charged batteries? No    Has battery cables? No    Has compatible battery clips? No      Pain Assessment   Currently in Pain? No/denies    Pain Score 0-No pain    Multiple Pain Sites No             Capillary Blood Glucose: No results found for this or any previous visit (from the past 24 hour(s)).    Social History   Tobacco Use  Smoking Status Former   Packs/day: 2.00   Years: 18.00   Pack years: 36.00   Types: Cigarettes   Quit date: 07/12/1987   Years since quitting: 33.6  Smokeless Tobacco Never    Goals Met:  Proper associated with RPD/PD & O2 Sat Exercise tolerated well No report of cardiac concerns or symptoms Strength training completed today  Goals Unmet:  Not Applicable  Comments: Service time is from 1015 to 74    Dr. Fransico Him is Medical Director for Cardiac Rehab at [facility].

## 2021-03-05 NOTE — Telephone Encounter (Signed)
Anna Valentine checked with RB & verified CT angio for Sept.  I scheduled CT and gave appt info to pt.  Will route back to triage so they can close MyChart message.

## 2021-03-09 ENCOUNTER — Other Ambulatory Visit: Payer: Self-pay

## 2021-03-09 ENCOUNTER — Other Ambulatory Visit: Payer: Medicare Other

## 2021-03-09 ENCOUNTER — Encounter (HOSPITAL_COMMUNITY)
Admission: RE | Admit: 2021-03-09 | Discharge: 2021-03-09 | Disposition: A | Discharge status: Home / Self Care | Payer: Medicare Other | Source: Ambulatory Visit | Attending: Emergency Medicine | Admitting: Emergency Medicine

## 2021-03-09 ENCOUNTER — Ambulatory Visit (HOSPITAL_COMMUNITY): Payer: Medicare Other

## 2021-03-09 DIAGNOSIS — J479 Bronchiectasis, uncomplicated: Secondary | ICD-10-CM | POA: Diagnosis not present

## 2021-03-09 DIAGNOSIS — A310 Pulmonary mycobacterial infection: Secondary | ICD-10-CM

## 2021-03-09 NOTE — Progress Notes (Signed)
Daily Session Note  Patient Details  Name: Anna Valentine MRN: [id number] Date of Birth: [birth year] Referring Provider:   April Manson Pulmonary Rehab Walk Test from 02/08/2021 in [place]  Referring Provider Dr. Lamonte Sakai       Encounter Date: 03/09/2021  Check In:  Session Check In - 03/09/21 1120       Check-In   Supervising physician immediately available to respond to emergencies [facility] immediately available    Physician(s) Dr. Doristine Bosworth    [facility]    Staff Present Rosebud Poles, RN, Quentin Ore, MS, ACSM-CEP, Exercise Physiologist;[name], RN    Virtual Visit No    Medication changes reported     No    Fall or balance concerns reported    No    Tobacco Cessation No Change    Warm-up and Cool-down Performed as group-led instruction    Resistance Training Performed Yes    VAD Patient? No    PAD/SET Patient? No      VAD patient   Has back up controller? No    Has spare charged batteries? No    Has battery cables? No      Pain Assessment   Currently in Pain? No/denies    Multiple Pain Sites No             Capillary Blood Glucose: No results found for this or any previous visit (from the past 24 hour(s)).    Social History   Tobacco Use  Smoking Status Former   Packs/day: 2.00   Years: 18.00   Pack years: 36.00   Types: Cigarettes   Quit date: 07/12/1987   Years since quitting: 33.6  Smokeless Tobacco Never    Goals Met:  Exercise tolerated well No report of concerns or symptoms today Strength training completed today  Goals Unmet:  Not Applicable  Comments: Service time is from 1019 to 1135    Dr. Fransico Him is Medical Director for Cardiac Rehab at [facility].

## 2021-03-10 ENCOUNTER — Ambulatory Visit (INDEPENDENT_AMBULATORY_CARE_PROVIDER_SITE_OTHER): Payer: Medicare Other | Admitting: Ophthalmology

## 2021-03-10 ENCOUNTER — Encounter (INDEPENDENT_AMBULATORY_CARE_PROVIDER_SITE_OTHER): Payer: Self-pay | Admitting: Ophthalmology

## 2021-03-10 ENCOUNTER — Encounter (INDEPENDENT_AMBULATORY_CARE_PROVIDER_SITE_OTHER): Payer: Medicare Other | Admitting: Ophthalmology

## 2021-03-10 DIAGNOSIS — H35371 Puckering of macula, right eye: Secondary | ICD-10-CM | POA: Diagnosis not present

## 2021-03-10 DIAGNOSIS — H33101 Unspecified retinoschisis, right eye: Secondary | ICD-10-CM | POA: Diagnosis not present

## 2021-03-10 NOTE — Progress Notes (Signed)
03/10/2021     CHIEF COMPLAINT Patient presents for  Chief Complaint  Patient presents with   Retina Follow Up      HISTORY OF PRESENT ILLNESS: Anna Valentine is a 70 y.o. female who presents to the clinic today for:   HPI     Retina Follow Up   Patient presents with  Other.  In both eyes.  This started 1 year ago.  Severity is mild.  Duration of 1 year.  Since onset it is stable.        Comments   1 year fu OU and OCT Pt states VA OU stable since last visit. Pt denies FOL, floaters, or ocular pain OU. '      Last edited by Kendra Opitz, COA on 03/10/2021 10:24 AM.      Referring physician: Monna Fam, MD [place],  [place] [id number]  HISTORICAL INFORMATION:   Selected notes from the [facility]: No current outpatient medications on file. (Ophthalmic Drugs)   No current facility-administered medications for this visit. (Ophthalmic Drugs)   Current Outpatient Medications (Other)  Medication Sig   acyclovir (ZOVIRAX) 400 MG tablet TAKE 1 TABLET BY MOUTH TWICE DAILY   albuterol (VENTOLIN HFA) 108 (90 Base) MCG/ACT inhaler INHALE 2 PUFFS INTO LUNGS EVERY 6 HOURS AS NEEDED FOR WHEEZING OR SHORTNESS OF BREATH.   azithromycin (ZITHROMAX) 500 MG tablet Take 500 mg by mouth daily.   Clofazimine POWD 100 mg by Does not apply route every morning.   ethambutol (MYAMBUTOL) 400 MG tablet Take 2 tablets (800 mg total) by mouth daily.   fluticasone (FLONASE) 50 MCG/ACT nasal spray    Multiple Vitamins-Minerals (ONE-A-DAY WOMENS PO) Take by mouth.   promethazine (PHENERGAN) 12.5 MG tablet Take 1 tablet (12.5 mg total) by mouth every 8 (eight) hours as needed.   tamoxifen (NOLVADEX) 20 MG tablet Take 1 tablet (20 mg total) by mouth at bedtime.   Current Facility-Administered Medications (Other)  Medication Route   0.9 %  sodium chloride infusion Intravenous      REVIEW OF  SYSTEMS:    ALLERGIES Allergies  Allergen Reactions   Rifampin Nausea And Vomiting   Taxol [Paclitaxel] Anaphylaxis   Paclitaxel (Protein-Bound) Hives   Tape Itching   Abraxane [Paclitaxel Protein-Bound Part] Rash   Clarithromycin Rash    PAST MEDICAL HISTORY Past Medical History:  Diagnosis Date   Anemia    hx of IDA   BRCA negative 2011   BRCA I/ II negative   Breast cancer ([place]) 08/2009   LEFT=stage 2, rx with lumpectomy and xrt   Breast cancer ([place]) 2019   RIGHT   Bronchiectasis ([place])    Chronic diastolic CHF (congestive heart failure) ([place]) 02/20/2020   Constipation    Essential hypertension 02/20/2020   Family history of adverse reaction to anesthesia    My Sister has nausea   Family history of breast cancer    Family history of colon cancer    Family history of lung cancer    GERD (gastroesophageal reflux disease)    not presently having symptoms   Hypertension    Iatrogenic pneumothorax 02/06/2019   Lung cancer ([place]) 06/06/05   stage 1 poorly differentiated adenocarcinoma, s/p right lower lobectomy.   Mycobacterium avium complex [facility])    dx 2021   Osteopenia 09/13/2013   Osteoporosis    Personal history of lung cancer    Pneumonia  2007ish, walking pnemonia - 2009 ish   STD (sexually transmitted disease)    HSV   Vitamin D deficiency 09/13/2013   Past Surgical History:  Procedure Laterality Date   BREAST BIOPSY     BREAST LUMPECTOMY  10/12/2009   Left lumpectomy and radiation, stage II, ER/PR+, Her 2 nu negative   BUNIONECTOMY Bilateral    COLONOSCOPY  2018   SA-MAC-suprep-no polyps   COLONOSCOPY     LOBECTOMY Right 06/06/2005   [place]   MASTECTOMY W/ SENTINEL NODE BIOPSY Bilateral 03/15/2018   MASTECTOMY W/ SENTINEL NODE BIOPSY Bilateral 03/15/2018   Procedure: BILATERAL TOTAL MASTECTOMIES WITH RIGHT SENTINEL LYMPH NODE BIOPSY;  Surgeon: Wakefield, Matthew, MD;  Location: [facility];  Service: General;  Laterality: Bilateral;   PORTACATH PLACEMENT N/A  03/15/2018   Procedure: INSERTION PORT-A-CATH WITH US;  Surgeon: Wakefield, Matthew, MD;  Location: [facility];  Service: General;  Laterality: N/A;   TONSILLECTOMY     TUBAL LIGATION  1984   VIDEO BRONCHOSCOPY  02/06/2019   Flexible video fiberoptic bronchoscopy with electromagnetic navigation and biopsies.   VIDEO BRONCHOSCOPY WITH ENDOBRONCHIAL NAVIGATION Left 02/06/2019   Procedure: VIDEO BRONCHOSCOPY WITH ENDOBRONCHIAL NAVIGATION, left lung;  Surgeon: Byrum, Robert S, MD;  Location: [facility];  Service: Thoracic;  Laterality: Left;   VIDEO BRONCHOSCOPY WITH ENDOBRONCHIAL NAVIGATION N/A 01/08/2020   Procedure: VIDEO BRONCHOSCOPY WITH ENDOBRONCHIAL NAVIGATION;  Surgeon: Byrum, Robert S, MD;  Location[facility];  Service: Thoracic;  Laterality: N/A;    FAMILY HISTORY Family History  Problem Relation Age of Onset   Allergies Mother    Asthma Mother    Lung cancer Mother    Breast cancer Mother 61       recurrence age 66   Colon cancer Father 76   Colon polyps Father 76   Prostate cancer Brother 48   Breast cancer Sister 52       Recurrence age 57 BRCA negative   Colon polyps Sister    Leukemia Sister    Breast cancer Sister 57   Prostate cancer Brother 63   Breast cancer Maternal Grandmother 70   Colon cancer Maternal Aunt    Leukemia Maternal Grandfather    Lung cancer Maternal Aunt    Breast cancer Cousin    Esophageal cancer Neg Hx    Rectal cancer Neg Hx    Stomach cancer Neg Hx     SOCIAL HISTORY Social History   Tobacco Use   Smoking status: Former    Packs/day: 2.00    Years: 18.00    Pack years: 36.00    Types: Cigarettes    Quit date: 07/12/1987    Years since quitting: 33.6   Smokeless tobacco: Never  Vaping Use   Vaping Use: Never used  Substance Use Topics   Alcohol use: Yes    Comment: 1 time per month   Drug use: No         OPHTHALMIC EXAM:  Base Eye Exam     Visual Acuity (ETDRS)       Right Left   Dist cc 20/25 +2 20/25         Tonometry  (Tonopen, 10:28 AM)       Right Left   Pressure 14 13         Pupils       Pupils Dark Light Shape React APD   Right PERRL 5 4 Round Brisk None   Left PERRL 5 4 Round Brisk None           Visual Fields (Counting fingers)       Left Right    Full Full         Extraocular Movement       Right Left    Full Full         Neuro/Psych     Oriented x3: Yes   Mood/Affect: Normal         Dilation     Both eyes: 1.0% Mydriacyl, 2.5% Phenylephrine @ 10:28 AM           Slit Lamp and Fundus Exam     External Exam       Right Left   External Normal Normal         Slit Lamp Exam       Right Left   Lids/Lashes Normal Normal   Conjunctiva/Sclera White and quiet White and quiet   Cornea Clear Clear   Anterior Chamber Deep and quiet Deep and quiet   Iris Round and reactive Round and reactive   Lens 2+ Nuclear sclerosis 2+ Nuclear sclerosis   Anterior Vitreous Normal Normal         Fundus Exam       Right Left   Posterior Vitreous Posterior vitreous detachment Posterior vitreous detachment   Disc Normal Normal   C/D Ratio 0.25 0.25   Macula Epiretinal membrane, minor topo distortion Normal   Vessels Normal Normal   Periphery Normal Normal            IMAGING AND PROCEDURES  Imaging and Procedures for 03/10/21  OCT, Retina - OU - Both Eyes       Right Eye Quality was good. Scan locations included subfoveal. Central Foveal Thickness: 332. Progression has been stable. Findings include epiretinal membrane, abnormal foveal contour.   Left Eye Quality was good. Scan locations included subfoveal. Central Foveal Thickness: 295. Progression has been stable. Findings include normal foveal contour.   Notes Epiretinal membrane with foveal macular schisis, actually slightly improved as compared to last year.  This finding OD year-over-year is not progressing             ASSESSMENT/PLAN:  Right epiretinal membrane The nature of macular  pucker (epiretinal membrane ERM) was discussed with the patient as well as threshold criteria for vitrectomy surgery. I explained that in rare cases another surgery is needed to actually remove a second wrinkle should it regrow.  Most often, the epiretinal membrane and underlying wrinkled internal limiting membrane are removed with the first surgery, to accomplish the goals.   If the operative eye is Phakic (natural lens still present), cataract surgery is often recommended prior to Vitrectomy. This will enable the retina surgeon to have the best view during surgery and the patient to obtain optimal results in the future. Treatment options were discussed.  I have recommended at home monitoring the near vision task in a monocular (1 eye at a time), with or without near vision glasses, to look for changes or declines in reading.  Minor, no change year-over-year will continue to observe     ICD-10-CM   1. Right epiretinal membrane  H35.371 OCT, Retina - OU - Both Eyes    2. Right retinoschisis  H33.101       1.  OD, with minor epiretinal membrane no interval change year-over-year we will continue observe  2.  Patient instructed and demonstrated and self monitoring on a monthly basis particular with reading and if reading acuity declines in the right eye nonemergent evaluation can  be undertaken. 3.  Ophthalmic Meds Ordered this visit:  No orders of the defined types were placed in this encounter.      Return in about 1 year (around 03/10/2022) for DILATE OU, OCT.  There are no Patient Instructions on file for this visit.   Explained the diagnoses, plan, and follow up with the patient and they expressed understanding.  Patient expressed understanding of the importance of proper follow up care.   Gary A. Rankin M.D. Diseases & Surgery of the Retina and Vitreous [facility] 03/10/21     Abbreviations: M myopia (nearsighted); A astigmatism; H hyperopia (farsighted); P  presbyopia; Mrx spectacle prescription;  CTL contact lenses; OD right eye; OS left eye; OU both eyes  XT exotropia; ET esotropia; PEK punctate epithelial keratitis; PEE punctate epithelial erosions; DES dry eye syndrome; MGD meibomian gland dysfunction; ATs artificial tears; PFAT's preservative free artificial tears; NSC nuclear sclerotic cataract; PSC posterior subcapsular cataract; ERM epi-retinal membrane; PVD posterior vitreous detachment; RD retinal detachment; DM diabetes mellitus; DR diabetic retinopathy; NPDR non-proliferative diabetic retinopathy; PDR proliferative diabetic retinopathy; CSME clinically significant macular edema; DME diabetic macular edema; dbh dot blot hemorrhages; CWS cotton wool spot; POAG primary open angle glaucoma; C/D cup-to-disc ratio; HVF humphrey visual field; GVF goldmann visual field; OCT optical coherence tomography; IOP intraocular pressure; BRVO Branch retinal vein occlusion; CRVO central retinal vein occlusion; CRAO central retinal artery occlusion; BRAO branch retinal artery occlusion; RT retinal tear; SB scleral buckle; PPV pars plana vitrectomy; VH Vitreous hemorrhage; PRP panretinal laser photocoagulation; IVK intravitreal kenalog; VMT vitreomacular traction; MH Macular hole;  NVD neovascularization of the disc; NVE neovascularization elsewhere; AREDS age related eye disease study; ARMD age related macular degeneration; POAG primary open angle glaucoma; EBMD epithelial/anterior basement membrane dystrophy; ACIOL anterior chamber intraocular lens; IOL intraocular lens; PCIOL posterior chamber intraocular lens; Phaco/IOL phacoemulsification with intraocular lens placement; PRK photorefractive keratectomy; LASIK laser assisted in situ keratomileusis; HTN hypertension; DM diabetes mellitus; COPD chronic obstructive pulmonary disease  

## 2021-03-10 NOTE — Assessment & Plan Note (Signed)
The nature of macular pucker (epiretinal membrane ERM) was discussed with the patient as well as threshold criteria for vitrectomy surgery. I explained that in rare cases another surgery is needed to actually remove a second wrinkle should it regrow.  Most often, the epiretinal membrane and underlying wrinkled internal limiting membrane are removed with the first surgery, to accomplish the goals.   If the operative eye is Phakic (natural lens still present), cataract surgery is often recommended prior to Vitrectomy. This will enable the retina surgeon to have the best view during surgery and the patient to obtain optimal results in the future. Treatment options were discussed.  I have recommended at home monitoring the near vision task in a monocular (1 eye at a time), with or without near vision glasses, to look for changes or declines in reading.  Minor, no change year-over-year will continue to observe

## 2021-03-11 ENCOUNTER — Other Ambulatory Visit: Payer: Self-pay

## 2021-03-11 ENCOUNTER — Encounter (HOSPITAL_COMMUNITY)
Admission: RE | Admit: 2021-03-11 | Discharge: 2021-03-11 | Disposition: A | Discharge status: Home / Self Care | Payer: Medicare Other | Source: Ambulatory Visit | Attending: Emergency Medicine | Admitting: Emergency Medicine

## 2021-03-11 ENCOUNTER — Ambulatory Visit (HOSPITAL_COMMUNITY): Payer: Medicare Other

## 2021-03-11 VITALS — Wt 109.3 lb

## 2021-03-11 DIAGNOSIS — J479 Bronchiectasis, uncomplicated: Secondary | ICD-10-CM | POA: Insufficient documentation

## 2021-03-11 NOTE — Progress Notes (Signed)
Daily Session Note  Patient Details  Name: Anna Valentine MRN: [id number] Date of Birth: Dec 02, 1950 Referring Provider:   April Manson Pulmonary Rehab Walk Test from 02/08/2021 in [place]  Referring Provider Dr. Lamonte Sakai       Encounter Date: 03/11/2021  Check In:  Session Check In - 03/11/21 1122       Check-In   Supervising physician immediately available to respond to emergencies [facility] immediately available    Physician(s) Dr. Doristine Bosworth    [facility]    Staff Present Rosebud Poles, RN, Quentin Ore, MS, ACSM-CEP, Exercise Physiologist;Lisa Ysidro Evert, RN    Virtual Visit No    Medication changes reported     No    Fall or balance concerns reported    No    Tobacco Cessation No Change    Warm-up and Cool-down Performed as group-led instruction    Resistance Training Performed Yes    VAD Patient? No    PAD/SET Patient? No      Pain Assessment   Currently in Pain? No/denies    Multiple Pain Sites No             Capillary Blood Glucose: No results found for this or any previous visit (from the past 24 hour(s)).    Social History   Tobacco Use  Smoking Status Former   Packs/day: 2.00   Years: 18.00   Pack years: 36.00   Types: Cigarettes   Quit date: 07/12/1987   Years since quitting: 33.6  Smokeless Tobacco Never    Goals Met:  Proper associated with RPD/PD & O2 Sat Exercise tolerated well No report of concerns or symptoms today Strength training completed today  Goals Unmet:  Not Applicable  Comments: Service time is from 1017 to 1128    Dr. Fransico Him is Medical Director for Cardiac Rehab at [facility].

## 2021-03-12 ENCOUNTER — Encounter: Payer: Self-pay | Admitting: Emergency Medicine

## 2021-03-12 NOTE — Assessment & Plan Note (Signed)
We will perform a repeat CT scan of the chest with angiography to evaluate your lung tissue and also look for chronic or acute blood clots. Please continue your 3 antibiotics as directed by Dr. Megan Salon Follow with Dr Lamonte Sakai next available after your CT chest so that we can review together

## 2021-03-12 NOTE — Assessment & Plan Note (Signed)
GI workup underway

## 2021-03-12 NOTE — Assessment & Plan Note (Signed)
Complicated CT chest with Btx and R lung destruction due to [facility]. On 3 drug regimen, probably at least in part responsible for her nausea.   We will perform a repeat CT scan of the chest with angiography to evaluate your lung tissue and also look for chronic or acute blood clots.

## 2021-03-16 ENCOUNTER — Ambulatory Visit (HOSPITAL_COMMUNITY): Payer: Medicare Other

## 2021-03-16 ENCOUNTER — Encounter (HOSPITAL_COMMUNITY): Payer: Medicare Other

## 2021-03-16 IMAGING — CT NM PET TUM IMG RESTAG (PS) SKULL BASE T - THIGH
7 series · 25 of 25 positions shown · non-contrast
Comparison: CT 12/18/2019

CLINICAL DATA: Subsequent treatment strategy for lung carcinoma.

EXAM:
NUCLEAR MEDICINE PET SKULL BASE TO THIGH
TECHNIQUE: 5.0 mCi F-18 FDG was injected intravenously. Full-ring PET imaging
was performed from the skull base to thigh after the radiotracer. CT
data was obtained and used for attenuation correction and anatomic
localization.
Fasting blood glucose: 90 mg/dl

[Series 3: pet sk_thigh ac · axial · 5.0mm · 4.07mm/px · z∈[-950,-126]mm · 5 of 207 slices shown]
[im 1/207]
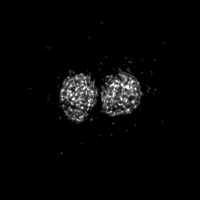
[im 52/207]
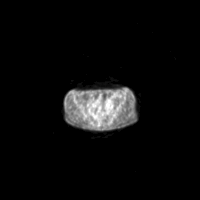
[im 104/207]
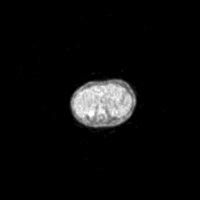
[im 155/207]
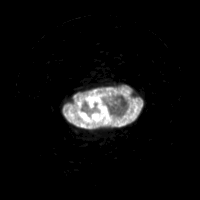
[im 207/207]
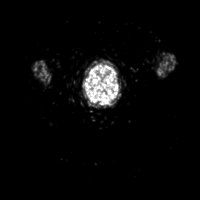

[Series 4: ct sk_thigh 5.0 b31f · axial · 5.0mm · 0.98mm/px · z∈[-950,-126]mm · 5 of 207 slices shown]
[im 1/207]
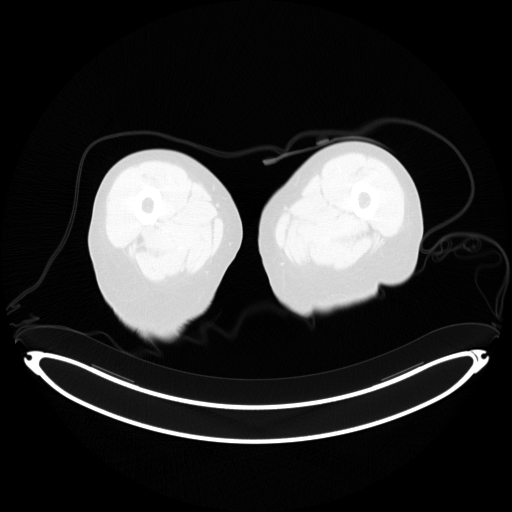
[im 52/207]
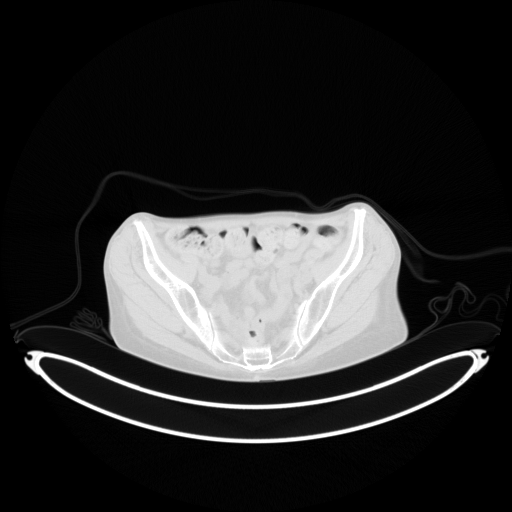
[im 104/207]
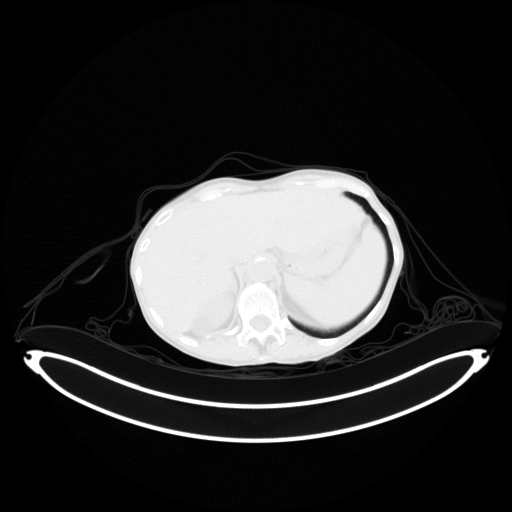
[im 155/207]
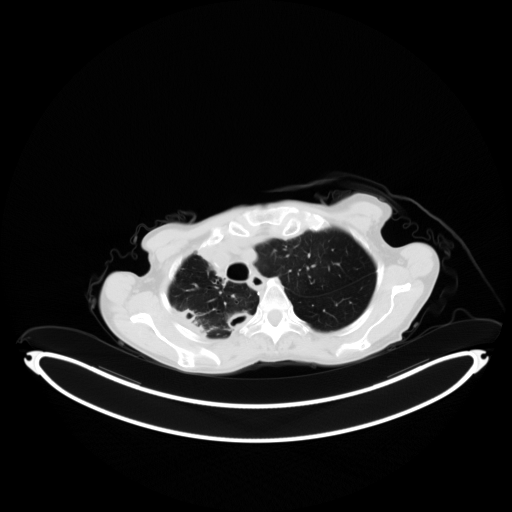
[im 207/207  brain]
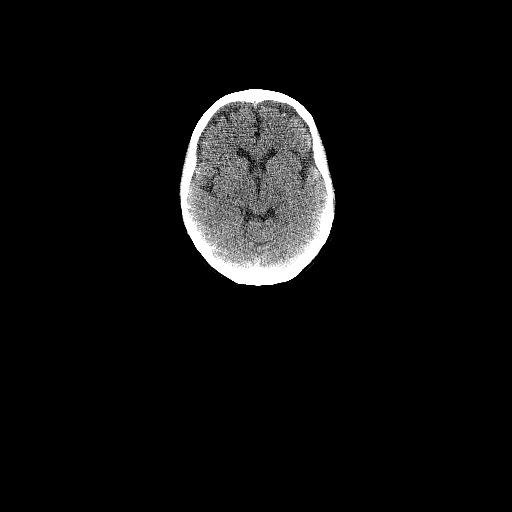

[Series 5: pet sk_thigh nac · axial · 5.0mm · 4.07mm/px · z∈[-950,-126]mm · 5 of 207 slices shown]
[im 1/207]
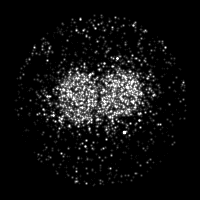
[im 52/207]
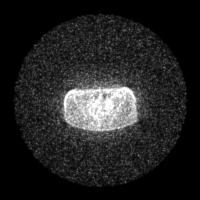
[im 104/207]
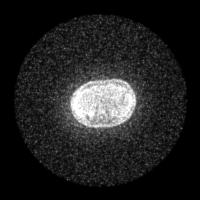
[im 155/207]
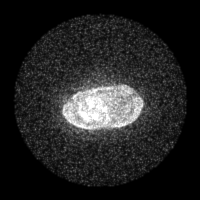
[im 207/207]
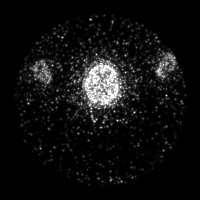

[Series 8: ct sk_thigh 5.0 b70f (id)_bone · axial · 5.0mm · 0.55mm/px · z∈[-548,-280]mm · 2 of 68 slices shown]
[im 1/68  bone]
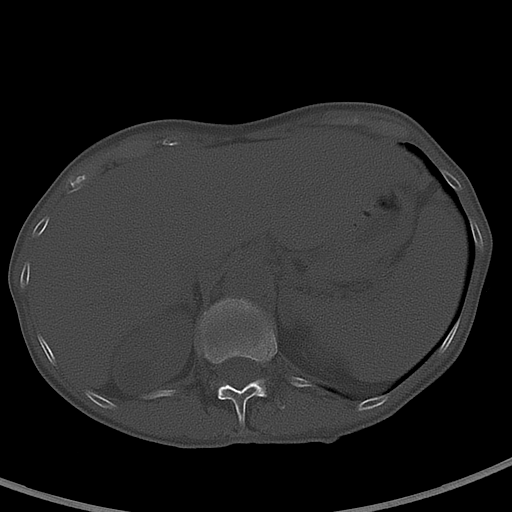
[im 68/68  bone]
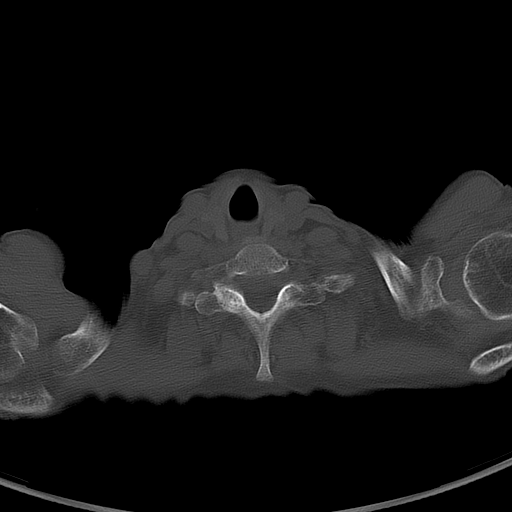

[Series 603: mip range 3 · coronal · 1.71mm/px · 1 of 32 slices shown]
[im 1/32]
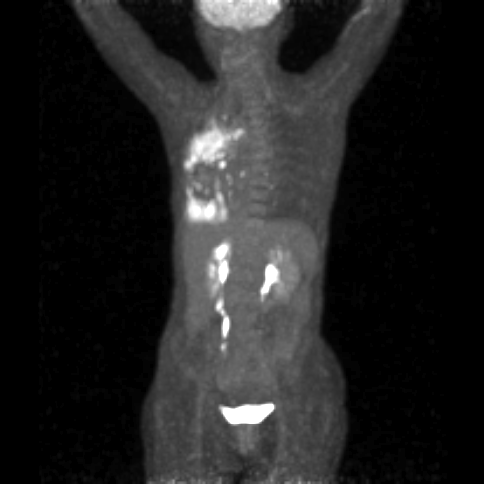

[Series 604: range-ct sk_thigh 5.0 (id)<alpha range> · 2 of 68 slices shown (1 of 2)]
[im 1/68]
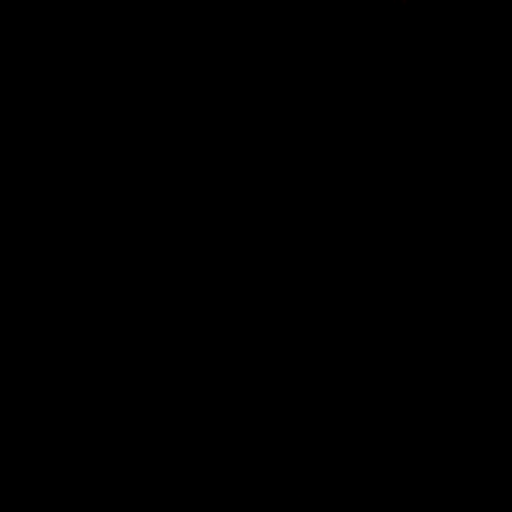
[im 68/68]
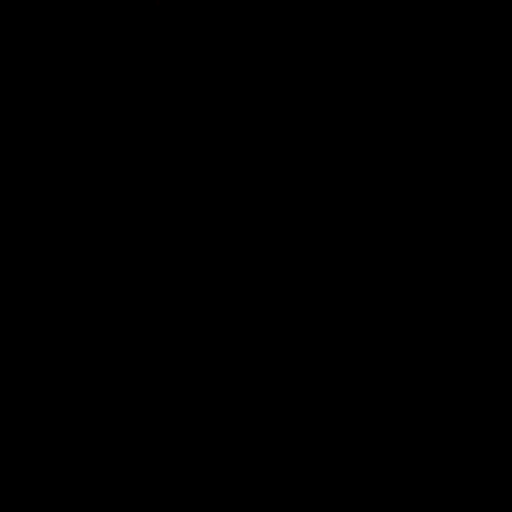

[Series 605: range-ct sk_thigh 5.0 (id)<alpha range> · 5 of 195 slices shown (2 of 2)]
[im 1/195]
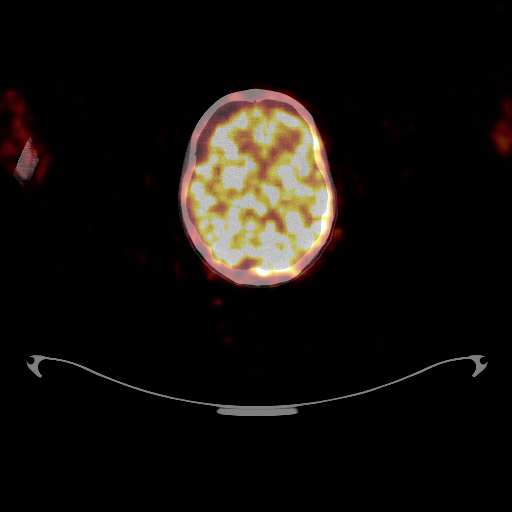
[im 49/195]
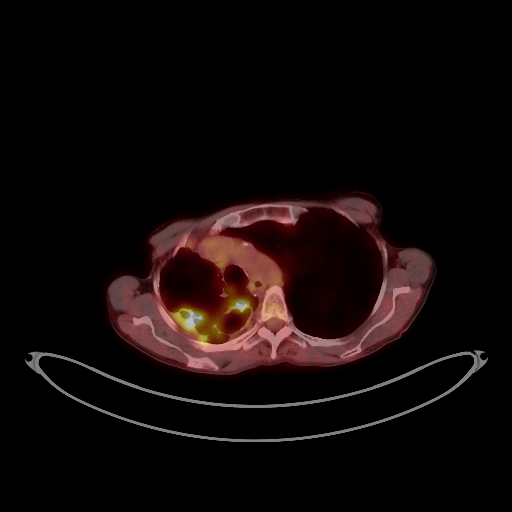
[im 98/195]
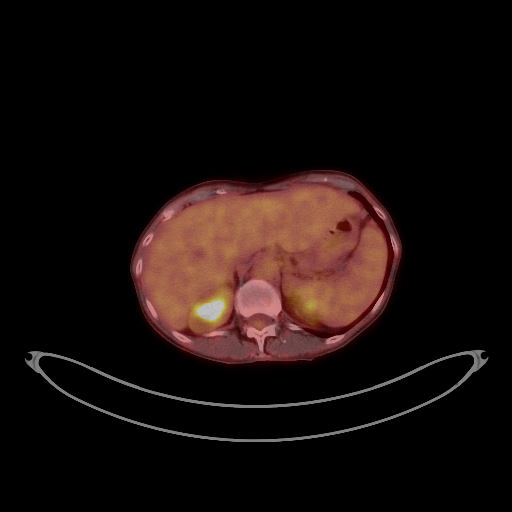
[im 146/195]
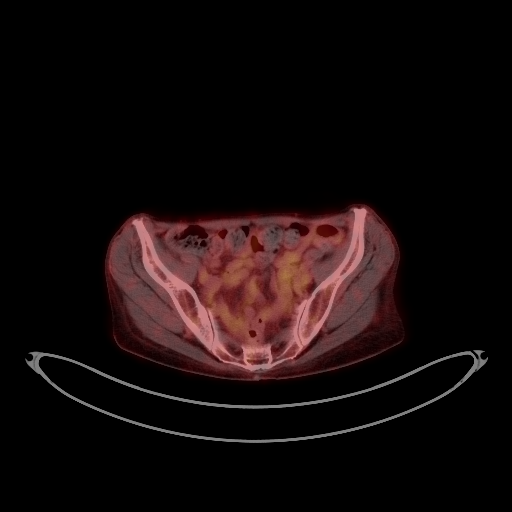
[im 195/195]
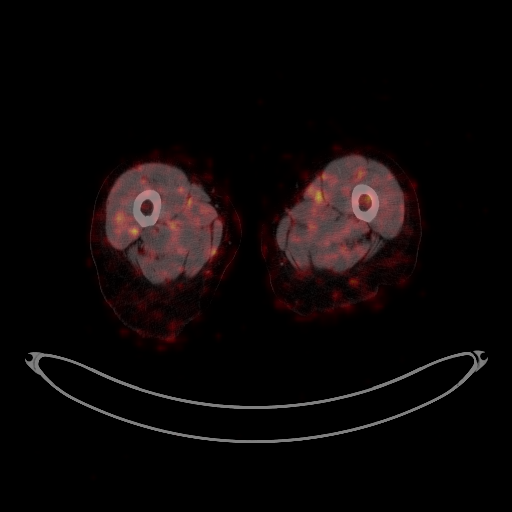

[25 of 25 positions shown; findings below may reference images not displayed]

FINDINGS: Mediastinal blood pool activity: SUV max

Liver activity: SUV max NA

NECK: No hypermetabolic lymph nodes in the neck.

Incidental CT findings: none

CHEST: Intense metabolic activity associated with the RIGHT lung
parenchymal consolidation as well as a cavitary lesion. The lung
consolidation intensely hypermetabolic with SUV max equal 13.2. A
cavitary lesion in the superior aspect of the RIGHT lung measuring
2.3 cm on image 55 is less hypermetabolic with SUV max equal 8.7.
Focus of hypermetabolic peripheral pleural thickening measuring
cm on image 80/4 with intense metabolic activity (SUV max equal
14.6. Small foci of consolidation in the RIGHT lower lung are also
hypermetabolic.

There are no hypermetabolic mediastinal lymph nodes.

Hypermetabolic nodule within the LEFT lung associated with
bronchiectasis and a small cavitary lesion with SUV max equal 3.1 on
image 76.

Incidental CT findings: none

ABDOMEN/PELVIS: No abnormal hypermetabolic activity within the
liver, pancreas, adrenal glands, or spleen. No hypermetabolic lymph
nodes in the abdomen or pelvis.

Incidental CT findings: Uterus and adnexa unremarkable.

SKELETON: No aggressive osseous lesions.

Incidental CT findings: none
IMPRESSION: 1. Intense metabolic activity associated with the RIGHT upper lobe
consolidation is highly concerning for lung cancer recurrence.
Pulmonary infection could have a similar pattern but less favored.
Recommend clinical correlation.
2. Additional pleural nodules and smaller foci of consolidation in
the lower RIGHT lung also with intense metabolic activity.
3. Cavitary lesion in the superior aspect of the RIGHT lung with
moderate metabolic activity again concerning for lung cancer
recurrence. Pulmonary infection such as fungal infection would also
be considered.
4. No hypermetabolic mediastinal lymph nodes.
5. Focus of bronchiectasis and nodularity in the lingula with
differential include infection versus metastatic disease.

## 2021-03-18 ENCOUNTER — Ambulatory Visit (HOSPITAL_COMMUNITY): Payer: Medicare Other

## 2021-03-18 ENCOUNTER — Encounter (HOSPITAL_COMMUNITY): Payer: Medicare Other

## 2021-03-18 ENCOUNTER — Telehealth: Payer: Self-pay

## 2021-03-18 NOTE — Telephone Encounter (Signed)
Received call from [facility] with Abnormal results. MYCOBACTERIA, CULTURE, WITH FLUOROCHROME SMEAR  MICRO NUMBER: [id number] P   SPECIMEN QUALITY: Adequate P   Source: SPUTUM P   STATUS: PRELIMINARY P   SMEAR: No acid-fast bacilli seen using the fluorochrome method. P   ISOLATE 1: acid-fast bacillus  Abnormal  P   Comment: Acid-fast bacilli present in liquid culture media. Identification to follow.  Will forward results to provider.  Leatrice Jewels, RMA

## 2021-03-22 ENCOUNTER — Telehealth: Payer: Self-pay

## 2021-03-22 ENCOUNTER — Encounter: Payer: Self-pay | Admitting: Internal Medicine

## 2021-03-22 ENCOUNTER — Ambulatory Visit
Admission: RE | Admit: 2021-03-22 | Discharge: 2021-03-22 | Disposition: A | Discharge status: Home / Self Care | Payer: Medicare Other | Source: Ambulatory Visit | Attending: Internal Medicine | Admitting: Internal Medicine

## 2021-03-22 ENCOUNTER — Telehealth (INDEPENDENT_AMBULATORY_CARE_PROVIDER_SITE_OTHER): Payer: Medicare Other | Admitting: Internal Medicine

## 2021-03-22 ENCOUNTER — Telehealth: Payer: Self-pay | Admitting: Internal Medicine

## 2021-03-22 ENCOUNTER — Other Ambulatory Visit: Payer: Self-pay

## 2021-03-22 VITALS — Ht 66.0 in | Wt 109.0 lb

## 2021-03-22 DIAGNOSIS — R9389 Abnormal findings on diagnostic imaging of other specified body structures: Secondary | ICD-10-CM

## 2021-03-22 DIAGNOSIS — M546 Pain in thoracic spine: Secondary | ICD-10-CM

## 2021-03-22 DIAGNOSIS — S22000A Wedge compression fracture of unspecified thoracic vertebra, initial encounter for closed fracture: Secondary | ICD-10-CM

## 2021-03-22 DIAGNOSIS — M8080XA Other osteoporosis with current pathological fracture, unspecified site, initial encounter for fracture: Secondary | ICD-10-CM | POA: Diagnosis not present

## 2021-03-22 DIAGNOSIS — M438X4 Other specified deforming dorsopathies, thoracic region: Secondary | ICD-10-CM | POA: Diagnosis not present

## 2021-03-22 DIAGNOSIS — Z85118 Personal history of other malignant neoplasm of bronchus and lung: Secondary | ICD-10-CM

## 2021-03-22 DIAGNOSIS — Z853 Personal history of malignant neoplasm of breast: Secondary | ICD-10-CM

## 2021-03-22 MED ORDER — HYDROCODONE-ACETAMINOPHEN 5-325 MG PO TABS
ORAL_TABLET | ORAL | 0 refills | Status: DC
Start: 2021-03-22 — End: 2021-05-13

## 2021-03-22 NOTE — Telephone Encounter (Signed)
Anna Valentine [phone]  Nene called to say she has pulled a muscle in her back on Friday when she lifted her luggage on Friday then she rode 8 hours in a car. This pain is making her sick to her stomach and at time she has difficulty breathing

## 2021-03-22 NOTE — Telephone Encounter (Signed)
-----   Message from [name], MD sent at 03/22/2021  2:57 PM EDT ----- Please set up MRI ----- Message ----- From: Interface, Rad Results In Sent: 03/22/2021   2:52 PM EDT To: [name], MD

## 2021-03-22 NOTE — Telephone Encounter (Signed)
Left message for patient to call back to discuss imaging results and recommendations.

## 2021-03-22 NOTE — Telephone Encounter (Signed)
scheduled

## 2021-03-22 NOTE — Progress Notes (Signed)
   Subjective:    Patient ID: Anna Valentine, female    DOB: [birth year], 70 y.o.   MRN: [id number]  HPI 70 year old Female seen today via interactive audio and video telecommunications due to Coronavirus pandemic.  She is identified using 2 identifiers as Anna Valentine. Anna Valentine, a patient in this practice.  She is agreeable to visit in this format today.  She is at her home and I am at my office.  She has a history of non-small cell lung cancer stage I dx 2006 status post right upper lobe wedge resection, middle lobe wedge resection, left lower lobectomy and mediastinal lymph node dissection.  Pathology showed 1.2 cm poorly differentiated adenocarcinoma.  Follow-up at [facility] and also here by Dr. Jana Hakim.  In February 2011 was diagnosed with breast cancer and underwent left lumpectomy with diagnosis of lobular carcinoma.  Sentinel node biopsy showed 1 out of 4 lymph nodes positive for invasive lobular carcinoma.  She was clinically stage II.  Tumor was ER positive PR positive HER2/neu negative.  Radiation therapy completed July 2011.  Patient underwent bilateral total mastectomies with right sentinel lymph node biopsy September 2019.  Had right breast cancer triple positive at that time.  Followed at infectious disease clinic with Mycobacterium avium infection and followed by pulmonary for bronchiectasi.  Recently traveled out of state and packed a rather heavy suitcase.  She was getting the suitcase out of the trunk after returning from her trip and suffered an acute injury of her back.  She is in considerable amount of pain.  I see a bone density study from 2013 where her T score was -2.5.  There is no if she was on bisphosphonate therapy.       Review of Systems see above     Objective:   Physical Exam  She is seen virtually in moderate pain.  Indicates pain is in her thoracic spine area.  Trouble ambulating due to pain.      Assessment & Plan:  Suspect compression fracture thoracic  spine  Plan: She will have thoracic spine exam.  Have sent prescription for hydrocodone APAP 5/325 to take 1 every 8 hours with food if needed for severe pain.  Addendum: Has wedge compression deformity of mid thoracic vertebral body concerning for fracture.  MRI recommended.  This is ordered for 04/04/2021.

## 2021-03-23 ENCOUNTER — Telehealth (HOSPITAL_COMMUNITY): Payer: Self-pay | Admitting: Internal Medicine

## 2021-03-23 ENCOUNTER — Ambulatory Visit (HOSPITAL_COMMUNITY): Payer: Medicare Other

## 2021-03-23 ENCOUNTER — Encounter (HOSPITAL_COMMUNITY)
Admission: RE | Admit: 2021-03-23 | Discharge: 2021-03-23 | Disposition: A | Discharge status: Home / Self Care | Payer: Medicare Other | Source: Ambulatory Visit | Attending: Emergency Medicine | Admitting: Emergency Medicine

## 2021-03-23 NOTE — Progress Notes (Deleted)
Pulmonary Individual Treatment Plan  Patient Details  Name: Anna Valentine MRN: [id number] Date of Birth: 1951/02/03 Referring Provider:   April Manson Pulmonary Rehab Walk Test from 02/08/2021 in [place]  Referring Provider Dr. Lamonte Sakai       Initial Encounter Date:  Flowsheet Row Pulmonary Rehab Walk Test from 02/08/2021 in [place]  Date 02/15/21       Visit Diagnosis: Bronchiectasis without complication ([place])  Patient's Home Medications on Admission:   Current Outpatient Medications:    acyclovir (ZOVIRAX) 400 MG tablet, TAKE 1 TABLET BY MOUTH TWICE DAILY, Disp: 120 tablet, Rfl: 0   albuterol (VENTOLIN HFA) 108 (90 Base) MCG/ACT inhaler, INHALE 2 PUFFS INTO LUNGS EVERY 6 HOURS AS NEEDED FOR WHEEZING OR SHORTNESS OF BREATH., Disp: 8.5 g, Rfl: PRN   azithromycin (ZITHROMAX) 500 MG tablet, Take 500 mg by mouth daily., Disp: , Rfl:    Clofazimine POWD, 100 mg by Does not apply route every morning., Disp: 3000 g, Rfl: 11   ethambutol (MYAMBUTOL) 400 MG tablet, Take 2 tablets (800 mg total) by mouth daily., Disp: 60 tablet, Rfl: 11   fluticasone (FLONASE) 50 MCG/ACT nasal spray, , Disp: , Rfl:    HYDROcodone-acetaminophen (NORCO) 5-325 MG tablet, One tab with food every 8 hours as needed for severe thoracic back pain, Disp: 15 tablet, Rfl: 0   Multiple Vitamins-Minerals (ONE-A-DAY WOMENS PO), Take by mouth., Disp: , Rfl:    promethazine (PHENERGAN) 12.5 MG tablet, Take 1 tablet (12.5 mg total) by mouth every 8 (eight) hours as needed., Disp: 30 tablet, Rfl: 1   tamoxifen (NOLVADEX) 20 MG tablet, Take 1 tablet (20 mg total) by mouth at bedtime., Disp: 90 tablet, Rfl: 4  Current Facility-Administered Medications:    0.9 %  sodium chloride infusion, 500 mL, Intravenous, Once, Armbruster, Carlota Raspberry, MD  Past Medical History: Past Medical History:  Diagnosis Date   Anemia    hx of IDA   BRCA negative 2011   BRCA I/ II  negative   Breast cancer ([place]) 08/2009   LEFT=stage 2, rx with lumpectomy and xrt   Breast cancer ([place]) 2019   RIGHT   Bronchiectasis ([place])    Chronic diastolic CHF (congestive heart failure) ([place]) 02/20/2020   Constipation    Essential hypertension 02/20/2020   Family history of adverse reaction to anesthesia    My Sister has nausea   Family history of breast cancer    Family history of colon cancer    Family history of lung cancer    GERD (gastroesophageal reflux disease)    not presently having symptoms   Hypertension    Iatrogenic pneumothorax 02/06/2019   Lung cancer ([place]) 06/06/05   stage 1 poorly differentiated adenocarcinoma, s/p right lower lobectomy.   Mycobacterium avium complex ([place])    dx 2021   Osteopenia 09/13/2013   Osteoporosis    Personal history of lung cancer    Pneumonia    2007ish, walking pnemonia - 2009 ish   STD (sexually transmitted disease)    HSV   Vitamin D deficiency 09/13/2013    Tobacco Use: Social History   Tobacco Use  Smoking Status Former   Packs/day: 2.00   Years: 18.00   Pack years: 36.00   Types: Cigarettes   Quit date: 07/12/1987   Years since quitting: 33.7  Smokeless Tobacco Never    Labs: Recent Review Flowsheet Data     Labs for ITP Cardiac and Pulmonary  Rehab Latest Ref Rng & Units 02/28/2011 09/29/2014 11/07/2017 01/28/2019   Cholestrol <200 mg/dL 208(H) 205(H) 213(H) 191   LDLCALC mg/dL (calc) 119(H) 105(H) 125(H) 105(H)   HDL > OR = 50 mg/dL 80 90 73 71   Trlycerides <150 mg/dL 47 52 56 68       Capillary Blood Glucose: Lab Results  Component Value Date   GLUCAP 90 12/31/2019   GLUCAP 83 01/02/2019     Pulmonary Assessment Scores:  Pulmonary Assessment Scores     Row Name 02/08/21 1007         ADL UCSD   ADL Phase Entry     SOB Score total 29           CAT Score   CAT Score 13           mMRC Score   mMRC Score 3             UCSD: Self-administered rating of dyspnea associated with activities  of daily living (ADLs) 6-point scale (0 = "not at all" to 5 = "maximal or unable to do because of breathlessness")  Scoring Scores range from 0 to 120.  Minimally important difference is 5 units  CAT: CAT can identify the health impairment of COPD patients and is better correlated with disease progression.  CAT has a scoring range of zero to 40. The CAT score is classified into four groups of low (less than 10), medium (10 - 20), high (21-30) and very high (31-40) based on the impact level of disease on health status. A CAT score over 10 suggests significant symptoms.  A worsening CAT score could be explained by an exacerbation, poor medication adherence, poor inhaler technique, or progression of COPD or comorbid conditions.  CAT MCID is 2 points  mMRC: mMRC (Modified Medical Research Council) Dyspnea Scale is used to assess the degree of baseline functional disability in patients of respiratory disease due to dyspnea. No minimal important difference is established. A decrease in score of 1 point or greater is considered a positive change.   Pulmonary Function Assessment:  Pulmonary Function Assessment - 02/08/21 0947       Breath   Bilateral Breath Sounds Clear    Shortness of Breath Yes;Limiting activity             Exercise Target Goals: Exercise Program Goal: Individual exercise prescription set using results from initial 6 min walk test and THRR while considering  patient's activity barriers and safety.   Exercise Prescription Goal: Initial exercise prescription builds to 30-45 minutes a day of aerobic activity, 2-3 days per week.  Home exercise guidelines will be given to patient during program as part of exercise prescription that the participant will acknowledge.  Activity Barriers & Risk Stratification:  Activity Barriers & Cardiac Risk Stratification - 02/08/21 0940       Activity Barriers & Cardiac Risk Stratification   Activity Barriers Deconditioning;Muscular  Weakness;Shortness of Breath             6 Minute Walk:  6 Minute Walk     Row Name 02/08/21 1026         6 Minute Walk   Phase Initial     Distance 1360 feet     Walk Time 6 minutes     # of Rest Breaks 0     MPH 2.58     METS 3.5     RPE 11     Perceived Dyspnea  0  VO2 Peak 12.24     Symptoms No     Resting HR 81 bpm     Resting BP 102/60     Resting Oxygen Saturation  99 %     Exercise Oxygen Saturation  during 6 min walk 98 %     Max Ex. HR 105 bpm     Max Ex. BP 110/60     2 Minute Post BP 108/60           Interval HR   1 Minute HR 95     2 Minute HR 102     3 Minute HR 105     4 Minute HR 101     5 Minute HR 100     6 Minute HR 100     2 Minute Post HR 86     Interval Heart Rate? Yes           Interval Oxygen   Interval Oxygen? Yes     Baseline Oxygen Saturation % 99 %     1 Minute Oxygen Saturation % 98 %     1 Minute Liters of Oxygen 0 L     2 Minute Oxygen Saturation % 100 %     2 Minute Liters of Oxygen 0 L     3 Minute Oxygen Saturation % 100 %     3 Minute Liters of Oxygen 0 L     4 Minute Oxygen Saturation % 100 %     4 Minute Liters of Oxygen 0 L     5 Minute Oxygen Saturation % 100 %     5 Minute Liters of Oxygen 0 L     6 Minute Oxygen Saturation % 99 %     6 Minute Liters of Oxygen 0 L     2 Minute Post Oxygen Saturation % 100 %     2 Minute Post Liters of Oxygen 0 L              Oxygen Initial Assessment:  Oxygen Initial Assessment - 02/08/21 0946       Home Oxygen   Home Oxygen Device None    Sleep Oxygen Prescription None    Home Exercise Oxygen Prescription None    Home Resting Oxygen Prescription None    Compliance with Home Oxygen Use Yes      Initial 6 min Walk   Oxygen Used None      Program Oxygen Prescription   Program Oxygen Prescription None      Intervention   Short Term Goals To learn and understand importance of monitoring SPO2 with pulse oximeter and demonstrate accurate use of the pulse  oximeter.;To learn and understand importance of maintaining oxygen saturations>88%;To learn and demonstrate proper pursed lip breathing techniques or other breathing techniques. ;To learn and demonstrate proper use of respiratory medications    Long  Term Goals Verbalizes importance of monitoring SPO2 with pulse oximeter and return demonstration;Maintenance of O2 saturations>88%;Compliance with respiratory medication;Exhibits proper breathing techniques, such as pursed lip breathing or other method taught during program session;Demonstrates proper use of MDI's             Oxygen Re-Evaluation:  Oxygen Re-Evaluation     Row Name 03/19/21 0911             Program Oxygen Prescription   Program Oxygen Prescription None               Home Oxygen   Home Oxygen Device None  Sleep Oxygen Prescription None       Home Exercise Oxygen Prescription None       Home Resting Oxygen Prescription None       Compliance with Home Oxygen Use Yes               Goals/Expected Outcomes   Short Term Goals To learn and understand importance of monitoring SPO2 with pulse oximeter and demonstrate accurate use of the pulse oximeter.;To learn and understand importance of maintaining oxygen saturations>88%;To learn and demonstrate proper pursed lip breathing techniques or other breathing techniques. ;To learn and demonstrate proper use of respiratory medications       Long  Term Goals Verbalizes importance of monitoring SPO2 with pulse oximeter and return demonstration;Maintenance of O2 saturations>88%;Compliance with respiratory medication;Exhibits proper breathing techniques, such as pursed lip breathing or other method taught during program session;Demonstrates proper use of MDI's       Goals/Expected Outcomes Compliance and understanding of monitoring oxygen saturation and importance of pursed lip breathing                Oxygen Discharge (Final Oxygen Re-Evaluation):  Oxygen Re-Evaluation -  03/19/21 0911       Program Oxygen Prescription   Program Oxygen Prescription None      Home Oxygen   Home Oxygen Device None    Sleep Oxygen Prescription None    Home Exercise Oxygen Prescription None    Home Resting Oxygen Prescription None    Compliance with Home Oxygen Use Yes      Goals/Expected Outcomes   Short Term Goals To learn and understand importance of monitoring SPO2 with pulse oximeter and demonstrate accurate use of the pulse oximeter.;To learn and understand importance of maintaining oxygen saturations>88%;To learn and demonstrate proper pursed lip breathing techniques or other breathing techniques. ;To learn and demonstrate proper use of respiratory medications    Long  Term Goals Verbalizes importance of monitoring SPO2 with pulse oximeter and return demonstration;Maintenance of O2 saturations>88%;Compliance with respiratory medication;Exhibits proper breathing techniques, such as pursed lip breathing or other method taught during program session;Demonstrates proper use of MDI's    Goals/Expected Outcomes Compliance and understanding of monitoring oxygen saturation and importance of pursed lip breathing             Initial Exercise Prescription:  Initial Exercise Prescription - 02/15/21 0700       Date of Initial Exercise RX and Referring Provider   Date 02/15/21    Referring Provider Dr. Lamonte Sakai    Expected Discharge Date 04/15/21      NuStep   Level 1    SPM 80    Minutes 15      Track   Minutes 15      Prescription Details   Frequency (times per week) 2    Duration Progress to 30 minutes of continuous aerobic without signs/symptoms of physical distress      Intensity   THRR 40-80% of Max Heartrate 60-120    Ratings of Perceived Exertion 11-13    Perceived Dyspnea 0-4      Progression   Progression Continue to progress workloads to maintain intensity without signs/symptoms of physical distress.      Resistance Training   Training Prescription  Yes    Weight red bands    Reps 10-15             Perform Capillary Blood Glucose checks as needed.  Exercise Prescription Changes:   Exercise Prescription Changes  [place] Name 02/16/21 1100 03/02/21 1200 03/11/21 1149         Response to Exercise   Blood Pressure (Admit) 120/84 118/76 106/60     Blood Pressure (Exercise) 114/64 112/80 --     Blood Pressure (Exit) 110/74 102/70 104/60     Heart Rate (Admit) 84 bpm 86 bpm 81 bpm     Heart Rate (Exercise) 112 bpm 109 bpm 99 bpm     Heart Rate (Exit) 92 bpm 88 bpm 99 bpm     Oxygen Saturation (Admit) 100 % 99 % 100 %     Oxygen Saturation (Exercise) 97 % 96 % 98 %     Oxygen Saturation (Exit) 98 % 99 % 100 %     Rating of Perceived Exertion (Exercise) _0 Perceived Dyspnea (Exercise) _1 Duration Continue with 30 min of aerobic exercise without signs/symptoms of physical distress. Continue with 30 min of aerobic exercise without signs/symptoms of physical distress. Continue with 30 min of aerobic exercise without signs/symptoms of physical distress.     Intensity Other (comment)  40-80% of HRR THRR unchanged THRR unchanged           Progression   Progression Continue to progress workloads to maintain intensity without signs/symptoms of physical distress. Continue to progress workloads to maintain intensity without signs/symptoms of physical distress. Continue to progress workloads to maintain intensity without signs/symptoms of physical distress.           Resistance Training   Training Prescription Yes Yes Yes     Weight Red bands Red bands Red Bands     Reps 10-15 10-15 10-15     Time 10 Minutes 10 Minutes 10 Minutes           Treadmill   MPH 1._2 Grade 0 1.5 3.5     Minutes _3 NuStep   Level _4 SPM 80 80 80     Minutes _5 METs 2 2.6 3              Exercise Comments:   Exercise Goals and Review:   Exercise Goals     Row Name 02/15/21 0729  02/23/21 3545 03/19/21 0911         Exercise Goals   Increase Physical Activity Yes Yes Yes     Intervention Provide advice, education, support and counseling about physical activity/exercise needs.;Develop an individualized exercise prescription for aerobic and resistive training based on initial evaluation findings, risk stratification, comorbidities and participant's personal goals. Provide advice, education, support and counseling about physical activity/exercise needs.;Develop an individualized exercise prescription for aerobic and resistive training based on initial evaluation findings, risk stratification, comorbidities and participant's personal goals. Provide advice, education, support and counseling about physical activity/exercise needs.;Develop an individualized exercise prescription for aerobic and resistive training based on initial evaluation findings, risk stratification, comorbidities and participant's personal goals.     Expected Outcomes Short Term: Attend rehab on a regular basis to increase amount of physical activity.;Long Term: Add in home exercise to make exercise part of routine and to increase amount of physical activity.;Long Term: Exercising regularly at least 3-5 days a week. Short Term: Attend rehab on a regular basis to increase amount of physical activity.;Long Term: Add in home exercise to make exercise part  of routine and to increase amount of physical activity.;Long Term: Exercising regularly at least 3-5 days a week. Short Term: Attend rehab on a regular basis to increase amount of physical activity.;Long Term: Add in home exercise to make exercise part of routine and to increase amount of physical activity.;Long Term: Exercising regularly at least 3-5 days a week.     Increase Strength and Stamina Yes Yes Yes     Intervention Provide advice, education, support and counseling about physical activity/exercise needs.;Develop an individualized exercise prescription for  aerobic and resistive training based on initial evaluation findings, risk stratification, comorbidities and participant's personal goals. Provide advice, education, support and counseling about physical activity/exercise needs.;Develop an individualized exercise prescription for aerobic and resistive training based on initial evaluation findings, risk stratification, comorbidities and participant's personal goals. Provide advice, education, support and counseling about physical activity/exercise needs.;Develop an individualized exercise prescription for aerobic and resistive training based on initial evaluation findings, risk stratification, comorbidities and participant's personal goals.     Expected Outcomes Short Term: Increase workloads from initial exercise prescription for resistance, speed, and METs.;Short Term: Perform resistance training exercises routinely during rehab and add in resistance training at home;Long Term: Improve cardiorespiratory fitness, muscular endurance and strength as measured by increased METs and functional capacity (6MWT) Short Term: Increase workloads from initial exercise prescription for resistance, speed, and METs.;Short Term: Perform resistance training exercises routinely during rehab and add in resistance training at home;Long Term: Improve cardiorespiratory fitness, muscular endurance and strength as measured by increased METs and functional capacity (6MWT) Short Term: Increase workloads from initial exercise prescription for resistance, speed, and METs.;Short Term: Perform resistance training exercises routinely during rehab and add in resistance training at home;Long Term: Improve cardiorespiratory fitness, muscular endurance and strength as measured by increased METs and functional capacity (6MWT)     Able to understand and use rate of perceived exertion (RPE) scale Yes Yes Yes     Intervention Provide education and explanation on how to use RPE scale Provide education  and explanation on how to use RPE scale Provide education and explanation on how to use RPE scale     Expected Outcomes Short Term: Able to use RPE daily in rehab to express subjective intensity level;Long Term:  Able to use RPE to guide intensity level when exercising independently Short Term: Able to use RPE daily in rehab to express subjective intensity level;Long Term:  Able to use RPE to guide intensity level when exercising independently Short Term: Able to use RPE daily in rehab to express subjective intensity level;Long Term:  Able to use RPE to guide intensity level when exercising independently     Able to understand and use Dyspnea scale Yes Yes Yes     Intervention Provide education and explanation on how to use Dyspnea scale Provide education and explanation on how to use Dyspnea scale Provide education and explanation on how to use Dyspnea scale     Expected Outcomes Short Term: Able to use Dyspnea scale daily in rehab to express subjective sense of shortness of breath during exertion;Long Term: Able to use Dyspnea scale to guide intensity level when exercising independently Short Term: Able to use Dyspnea scale daily in rehab to express subjective sense of shortness of breath during exertion;Long Term: Able to use Dyspnea scale to guide intensity level when exercising independently Short Term: Able to use Dyspnea scale daily in rehab to express subjective sense of shortness of breath during exertion;Long Term: Able to use Dyspnea scale to guide  intensity level when exercising independently     Knowledge and understanding of Target Heart Rate Range (THRR) Yes Yes Yes     Intervention Provide education and explanation of THRR including how the numbers were predicted and where they are located for reference Provide education and explanation of THRR including how the numbers were predicted and where they are located for reference Provide education and explanation of THRR including how the numbers  were predicted and where they are located for reference     Expected Outcomes Short Term: Able to state/look up THRR;Long Term: Able to use THRR to govern intensity when exercising independently;Short Term: Able to use daily as guideline for intensity in rehab Short Term: Able to state/look up THRR;Short Term: Able to use daily as guideline for intensity in rehab;Long Term: Able to use THRR to govern intensity when exercising independently Short Term: Able to state/look up THRR;Short Term: Able to use daily as guideline for intensity in rehab;Long Term: Able to use THRR to govern intensity when exercising independently     Understanding of Exercise Prescription Yes Yes Yes     Intervention Provide education, explanation, and written materials on patient's individual exercise prescription Provide education, explanation, and written materials on patient's individual exercise prescription Provide education, explanation, and written materials on patient's individual exercise prescription     Expected Outcomes Short Term: Able to explain program exercise prescription;Long Term: Able to explain home exercise prescription to exercise independently Short Term: Able to explain program exercise prescription;Long Term: Able to explain home exercise prescription to exercise independently Short Term: Able to explain program exercise prescription;Long Term: Able to explain home exercise prescription to exercise independently              Exercise Goals Re-Evaluation :  Exercise Goals Re-Evaluation     Row Name 02/23/21 0924 03/19/21 0912           Exercise Goal Re-Evaluation   Exercise Goals Review Increase Physical Activity;Increase Strength and Stamina;Able to understand and use rate of perceived exertion (RPE) scale;Able to understand and use Dyspnea scale;Knowledge and understanding of Target Heart Rate Range (THRR);Understanding of Exercise Prescription Increase Physical Activity;Increase Strength and  Stamina;Able to understand and use rate of perceived exertion (RPE) scale;Able to understand and use Dyspnea scale;Knowledge and understanding of Target Heart Rate Range (THRR);Understanding of Exercise Prescription      Comments Keonna has attended 2 exercise sessions. She is deconditioned but tolerates exercise well. Bettyjo performs the warm up and resistance exercises standing up. Will continue to monitor and progress as necessary. Kyisha has attended 8 exercise sessions. She tolerates exercise well on the Nustep and treadmill. Sharlon has progressed workloads on both pieces of equipment. She averages 3.1 METs at level 4 on the Nustep and 4.74 METs at 3.0 mph @ 3.5% on the treadmill. Katilyn is motivated to exercise. She performs the warmup and cooldown standing up. Will continue to monitor and progress as able.      Expected Outcomes Through exercise at rehab and home, the pt will decrease SOB with daily activities and feel confident in carrying out an exercise regimen at home. Through exercise at rehab and home, the pt will decrease SOB with daily activities and feel confident in carrying out an exercise regimen at home.               Discharge Exercise Prescription (Final Exercise Prescription Changes):  Exercise Prescription Changes - 03/11/21 1149       Response to Exercise  Blood Pressure (Admit) 106/60    Blood Pressure (Exit) 104/60    Heart Rate (Admit) 81 bpm    Heart Rate (Exercise) 99 bpm    Heart Rate (Exit) 99 bpm    Oxygen Saturation (Admit) 100 %    Oxygen Saturation (Exercise) 98 %    Oxygen Saturation (Exit) 100 %    Rating of Perceived Exertion (Exercise) 11    Perceived Dyspnea (Exercise) 1    Duration Continue with 30 min of aerobic exercise without signs/symptoms of physical distress.    Intensity THRR unchanged      Progression   Progression Continue to progress workloads to maintain intensity without signs/symptoms of physical distress.       Resistance Training   Training Prescription Yes    Weight Red Bands    Reps 10-15    Time 10 Minutes      Treadmill   MPH 3    Grade 3.5    Minutes 15      NuStep   Level 4    SPM 80    Minutes 15    METs 3             Nutrition:  Target Goals: Understanding of nutrition guidelines, daily intake of sodium <1544m, cholesterol <2051m calories 30% from fat and 7% or less from saturated fats, daily to have 5 or more servings of fruits and vegetables.  Biometrics:  Pre Biometrics - 02/08/21 0945       Pre Biometrics   Height _0  (1.676 m)    Weight 48.7 kg    BMI (Calculated) 17.34    Grip Strength 21 kg              Nutrition Therapy Plan and Nutrition Goals:  Nutrition Therapy & Goals - 02/18/21 1321       Nutrition Therapy   Diet General Healthful      Personal Nutrition Goals   Nutrition Goal Pt to identify food quantities necessary to achieve weight gain of 0.5 lb per week at graduation from pulmonary rehab.    Personal Goal #2 Pt to build a healthy plate including vegetables, fruits, whole grains, and low-fat dairy products in a heart healthy meal plan.    Personal Goal #3 Pt to eat snacks/mini meals between meals such as 1 cup milk or 1 oz nuts      Intervention Plan   Intervention Prescribe, educate and counsel regarding individualized specific dietary modifications aiming towards targeted core components such as weight, hypertension, lipid management, diabetes, heart failure and other comorbidities.;Nutrition handout(s) given to patient.    Expected Outcomes Long Term Goal: Adherence to prescribed nutrition plan.;Short Term Goal: A plan has been developed with personal nutrition goals set during dietitian appointment.             Nutrition Assessments:  MEDIFICTS Score Key: ?70 Need to make dietary changes  40-70 Heart Healthy Diet ? 40 Therapeutic Level Cholesterol Diet  Flowsheet Row PULMONARY REHAB OTHER RESPIRATORY [facility] [facility]  [place] 42      Picture Your Plate Scores: <4<61nhealthy dietary pattern with much room for improvement. 41-50 Dietary pattern unlikely to meet recommendations for good health and room for improvement. 51-60 More healthful dietary pattern, with some room for improvement.  >60 Healthy dietary pattern, although there may be some specific behaviors that could be improved.    Nutrition Goals Re-Evaluation:  Nutrition  Goals Re-Evaluation     Row Name 02/18/21 1322             Goals   Current Weight 108 lb (49 kg)       Nutrition Goal Pt to identify food quantities necessary to achieve weight gain of 0.5 lb per week at graduation from pulmonary rehab.               Personal Goal #2 Re-Evaluation   Personal Goal #2 Pt to build a healthy plate including vegetables, fruits, whole grains, and low-fat dairy products in a heart healthy meal plan.               Personal Goal #3 Re-Evaluation   Personal Goal #3 Pt to eat snacks/mini meals between meals such as 1 cup milk or 1 oz nuts                Nutrition Goals Discharge (Final Nutrition Goals Re-Evaluation):  Nutrition Goals Re-Evaluation - 02/18/21 1322       Goals   Current Weight 108 lb (49 kg)    Nutrition Goal Pt to identify food quantities necessary to achieve weight gain of 0.5 lb per week at graduation from pulmonary rehab.      Personal Goal #2 Re-Evaluation   Personal Goal #2 Pt to build a healthy plate including vegetables, fruits, whole grains, and low-fat dairy products in a heart healthy meal plan.      Personal Goal #3 Re-Evaluation   Personal Goal #3 Pt to eat snacks/mini meals between meals such as 1 cup milk or 1 oz nuts             Psychosocial: Target Goals: Acknowledge presence or absence of significant depression and/or stress, maximize coping skills, provide positive support system. Participant is able to verbalize  types and ability to use techniques and skills needed for reducing stress and depression.  Initial Review & Psychosocial Screening:  Initial Psych Review & Screening - 02/08/21 0948       Initial Review   Current issues with None Identified      Family Dynamics   Good Support System? Yes   Lots of friends and church members     Barriers   Psychosocial barriers to participate in program There are no identifiable barriers or psychosocial needs.      Screening Interventions   Interventions Encouraged to exercise             Quality of Life Scores:  Scores of 19 and below usually indicate a poorer quality of life in these areas.  A difference of  2-3 points is a clinically meaningful difference.  A difference of 2-3 points in the total score of the Quality of Life Index has been associated with significant improvement in overall quality of life, self-image, physical symptoms, and general health in studies assessing change in quality of life.  PHQ-9: Recent Review Flowsheet Data     Depression screen [facility] 2/9 02/08/2021 08/11/2020 07/22/2020 06/25/2020 04/23/2020   Decreased Interest 0 0 0 0 0   Down, Depressed, Hopeless 0 0 0 0 0   PHQ - 2 Score 0 0 0 0 0   Altered sleeping 0 - - - -   Tired, decreased energy 0 - - - -   Change in appetite 0 - - - -   Feeling bad or failure about yourself  0 - - - -   Trouble concentrating 0 - - - -  Moving slowly or fidgety/restless 0 - - - -   Suicidal thoughts 0 - - - -   Difficult doing work/chores Not difficult at all - - - -      Interpretation of Total Score  Total Score Depression Severity:  1-4 = Minimal depression, 5-9 = Mild depression, 10-14 = Moderate depression, 15-19 = Moderately severe depression, 20-27 = Severe depression   Psychosocial Evaluation and Intervention:  Psychosocial Evaluation - 02/08/21 0951       Psychosocial Evaluation & Interventions   Interventions Encouraged to exercise with the program and follow  exercise prescription    Comments No concerns identified at this time.    Expected Outcomes To be free of psychosocial concerns while participating in pulmonary rehab.    Continue Psychosocial Services  No Follow up required             Psychosocial Re-Evaluation:  Psychosocial Re-Evaluation     [place] Name 02/22/21 1247 03/22/21 2423           Psychosocial Re-Evaluation   Current issues with None Identified None Identified      Comments No psychosocial concerns identified at this time, she has a positive attitude and seems to be enjoying exercising in pulmonary rehab. No psychosocial concerns identified at this time.      Expected Outcomes For Maebry to continue to be free of psychosocial concerns while participating in pulmonary rehab. For Aletha to continue to have no psychosocial concerns while participating in pulmonary rehab.      Interventions Encouraged to attend Pulmonary Rehabilitation for the exercise Encouraged to attend Pulmonary Rehabilitation for the exercise      Continue Psychosocial Services  No Follow up required No Follow up required               Psychosocial Discharge (Final Psychosocial Re-Evaluation):  Psychosocial Re-Evaluation - 03/22/21 5361       Psychosocial Re-Evaluation   Current issues with None Identified    Comments No psychosocial concerns identified at this time.    Expected Outcomes For Lema to continue to have no psychosocial concerns while participating in pulmonary rehab.    Interventions Encouraged to attend Pulmonary Rehabilitation for the exercise    Continue Psychosocial Services  No Follow up required             Education: Education Goals: Education classes will be provided on a weekly basis, covering required topics. Participant will state understanding/return demonstration of topics presented.  Learning Barriers/Preferences:  Learning Barriers/Preferences - 02/08/21 4431       Learning Barriers/Preferences    Learning Barriers None    Learning Preferences Individual Instruction;Skilled Demonstration;Group Instruction;Written Material             Education Topics: Risk Factor Reduction:  -Group instruction that is supported by a PowerPoint presentation. Instructor discusses the definition of a risk factor, different risk factors for pulmonary disease, and how the heart and lungs work together.     Nutrition for Pulmonary Patient:  -Group instruction provided by PowerPoint slides, verbal discussion, and written materials to support subject matter. The instructor gives an explanation and review of healthy diet recommendations, which includes a discussion on weight management, recommendations for fruit and vegetable consumption, as well as protein, fluid, caffeine, fiber, sodium, sugar, and alcohol. Tips for eating when patients are short of breath are discussed.   Pursed Lip Breathing:  -Group instruction that is supported by demonstration and informational handouts. Instructor discusses the benefits of pursed lip  and diaphragmatic breathing and detailed demonstration on how to preform both.     Oxygen Safety:  -Group instruction provided by PowerPoint, verbal discussion, and written material to support subject matter. There is an overview of "What is Oxygen" and "Why do we need it".  Instructor also reviews how to create a safe environment for oxygen use, the importance of using oxygen as prescribed, and the risks of noncompliance. There is a brief discussion on traveling with oxygen and resources the patient may utilize.   Oxygen Equipment:  -Group instruction provided by [facility] Staff utilizing handouts, written materials, and equipment demonstrations.   Signs and Symptoms:  -Group instruction provided by written material and verbal discussion to support subject matter. Warning signs and symptoms of infection, stroke, and heart attack are reviewed and when to call the physician/911  reinforced. Tips for preventing the spread of infection discussed.   Advanced Directives:  -Group instruction provided by verbal instruction and written material to support subject matter. Instructor reviews Advanced Directive laws and proper instruction for filling out document.   Pulmonary Video:  -Group video education that reviews the importance of medication and oxygen compliance, exercise, good nutrition, pulmonary hygiene, and pursed lip and diaphragmatic breathing for the pulmonary patient.   Exercise for the Pulmonary Patient:  -Group instruction that is supported by a PowerPoint presentation. Instructor discusses benefits of exercise, core components of exercise, frequency, duration, and intensity of an exercise routine, importance of utilizing pulse oximetry during exercise, safety while exercising, and options of places to exercise outside of rehab.     Pulmonary Medications:  -Verbally interactive group education provided by instructor with focus on inhaled medications and proper administration.   Anatomy and Physiology of the Respiratory System and Intimacy:  -Group instruction provided by PowerPoint, verbal discussion, and written material to support subject matter. Instructor reviews respiratory cycle and anatomical components of the respiratory system and their functions. Instructor also reviews differences in obstructive and restrictive respiratory diseases with examples of each. Intimacy, Sex, and Sexuality differences are reviewed with a discussion on how relationships can change when diagnosed with pulmonary disease. Common sexual concerns are reviewed. Flowsheet Row PULMONARY REHAB OTHER RESPIRATORY from 03/11/2021 in [place]  Date 03/11/21  Educator Handout  [Beat a Sedentary Lifestyle]       MD DAY -A group question and answer session with a medical doctor that allows participants to ask questions that relate to their pulmonary  disease state.   OTHER EDUCATION -Group or individual verbal, written, or video instructions that support the educational goals of the pulmonary rehab program. [place] from 03/11/2021 in [place]  Date 02/23/21  Educator Handout  [facility] Reading]       Holiday Eating Survival Tips:  -Group instruction provided by PowerPoint slides, verbal discussion, and written materials to support subject matter. The instructor gives patients tips, tricks, and techniques to help them not only survive but enjoy the holidays despite the onslaught of food that accompanies the holidays.   Knowledge Questionnaire Score:  Knowledge Questionnaire Score - 02/08/21 1008       Knowledge Questionnaire Score   Pre Score 18/18             Core Components/Risk Factors/Patient Goals at Admission:  Personal Goals and Risk Factors at Admission - 02/08/21 0952       Core Components/Risk Factors/Patient Goals on Admission    Weight Management Weight Gain  Improve shortness of breath with ADL's Yes    Intervention Provide education, individualized exercise plan and daily activity instruction to help decrease symptoms of SOB with activities of daily living.    Expected Outcomes Short Term: Improve cardiorespiratory fitness to achieve a reduction of symptoms when performing ADLs;Long Term: Be able to perform more ADLs without symptoms or delay the onset of symptoms             Core Components/Risk Factors/Patient Goals Review:   Goals and Risk Factor Review     Row Name 02/08/21 0953 02/22/21 1254 03/22/21 0920         Core Components/Risk Factors/Patient Goals Review   Personal Goals Review Develop more efficient breathing techniques such as purse lipped breathing and diaphragmatic breathing and practicing self-pacing with activity.;Increase knowledge of respiratory medications and ability to use respiratory devices  properly.;Improve shortness of breath with ADL's Develop more efficient breathing techniques such as purse lipped breathing and diaphragmatic breathing and practicing self-pacing with activity.;Increase knowledge of respiratory medications and ability to use respiratory devices properly.;Improve shortness of breath with ADL's Develop more efficient breathing techniques such as purse lipped breathing and diaphragmatic breathing and practicing self-pacing with activity.;Increase knowledge of respiratory medications and ability to use respiratory devices properly.;Improve shortness of breath with ADL's     Review -- Farida has attended 2 exercise sessions, she tolerated them well, but it is too early to see progression toward goals yet. Raelee has completed 4 weeks of exercise in pulmonary rehab.  She is progressing well and has increased her strenth and stamina on both the treadmill and nustep.  She seems to enjoy participating, is very positive, and is improving her shortness of breath and has learned breathing techniques which aid her in her daily activities and exercise.     Expected Outcomes -- See admission goals. Ass admission goals.              Core Components/Risk Factors/Patient Goals at Discharge (Final Review):   Goals and Risk Factor Review - 03/22/21 0920       Core Components/Risk Factors/Patient Goals Review   Personal Goals Review Develop more efficient breathing techniques such as purse lipped breathing and diaphragmatic breathing and practicing self-pacing with activity.;Increase knowledge of respiratory medications and ability to use respiratory devices properly.;Improve shortness of breath with ADL's    Review Syana has completed 4 weeks of exercise in pulmonary rehab.  She is progressing well and has increased her strenth and stamina on both the treadmill and nustep.  She seems to enjoy participating, is very positive, and is improving her shortness of breath and has  learned breathing techniques which aid her in her daily activities and exercise.    Expected Outcomes Ass admission goals.             ITP Comments:   Comments:

## 2021-03-25 ENCOUNTER — Ambulatory Visit (HOSPITAL_COMMUNITY): Payer: Medicare Other

## 2021-03-25 ENCOUNTER — Encounter: Payer: Self-pay | Admitting: Internal Medicine

## 2021-03-25 ENCOUNTER — Encounter (HOSPITAL_COMMUNITY): Payer: Medicare Other

## 2021-03-25 NOTE — Patient Instructions (Signed)
To have thoracic spine pain to rule out compression fracture.  May take hydrocodone APAP 5/325 sparingly every 8 hours with food if needed for severe pain.  We will be in touch with her about next steps after x-ray is performed.

## 2021-03-26 ENCOUNTER — Ambulatory Visit (INDEPENDENT_AMBULATORY_CARE_PROVIDER_SITE_OTHER): Payer: Medicare Other | Admitting: Internal Medicine

## 2021-03-26 ENCOUNTER — Encounter: Payer: Self-pay | Admitting: Internal Medicine

## 2021-03-26 ENCOUNTER — Other Ambulatory Visit: Payer: Self-pay

## 2021-03-26 VITALS — BP 112/72 | HR 80 | Ht 66.0 in | Wt 109.0 lb

## 2021-03-26 DIAGNOSIS — S22000A Wedge compression fracture of unspecified thoracic vertebra, initial encounter for closed fracture: Secondary | ICD-10-CM | POA: Diagnosis not present

## 2021-03-26 DIAGNOSIS — Z853 Personal history of malignant neoplasm of breast: Secondary | ICD-10-CM

## 2021-03-26 DIAGNOSIS — Z85118 Personal history of other malignant neoplasm of bronchus and lung: Secondary | ICD-10-CM

## 2021-03-26 DIAGNOSIS — M8080XA Other osteoporosis with current pathological fracture, unspecified site, initial encounter for fracture: Secondary | ICD-10-CM

## 2021-03-26 NOTE — Progress Notes (Signed)
   Subjective:    Patient ID: Anna Valentine, female    DOB: [birth year], 70 y.o.   MRN: [id number]  HPI 70 year old Female seen with acute back pain.  She recently went on a road trip to see her sister.  She carried a heavy suitcase and actually lifted the suitcase into the trunk of the car.  She is having pain in her thoracic back area and around her breasts.  I am concerned she may have an acute compression fracture.  She had T-spine films today showing mild wedge compression deformity of mid thoracic vertebral body concerning for fracture of indeterminate age.  She has remote history of breast and lung cancer.  History of non-small cell lung cancer stage Ia in 2006 status post right upper lobe wedge resection, middle lobe wedge resection, left lower lobectomy and mediastinal lymph node dissection.  Pathology showed 1.2 cm poorly differentiated adenocarcinoma.  In February 2011 was diagnosed with breast cancer and underwent left lumpectomy with diagnosis of lobular carcinoma.  Sentinel node biopsy showed 1 out of 4 lymph nodes positive for invasive lobular carcinoma.  Clinically with stage II.  Tumor was ER positive PR positive HER2/neu negative.  Radiation therapy completed July 2011.  History of osteopenia.  She underwent bilateral total mastectomies with right sentinel lymph node biopsy September 2019.  She had triple positive right breast cancer.  Has been followed by Dr. Jana Hakim.  Was treated with chemotherapy.  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  Social history: She is a widow.  No children.  Resides alone.  Quit smoking around 1991.  For family history: See dictation 02/04/2019.  She also has Mycobacterium avium infection with bronchiectasis.  She is followed by Pulmonary and Infectious disease clinic.  Review of Systems had bone density study 2013 with T score -2.5 and took oral bisphosphanate. Had GERD on bisphosphonate. Records indicate  she was taking it in 2012 and stopped in 2014.     Objective:   Physical Exam Blood pressure 112/72 pulse 80 ,pulse oximetry 94% weight 109 pounds,  Height: 5 feet 6 inches,BMI 17.59  Her chest is fairly clear to auscultation without rales or wheezing.  She has tenderness over her thoracic spine to palpation.  No significant tenderness of the ribs.     Assessment & Plan:  Acute thoracic back pain after lifting heavy suitcase-concern for thoracic compression fracture  Plan: She will have thoracic spine films today and I have given her Norco 5/325 #15 tabs to take sparingly for pain.  Addendum: Radiologist concerned about possible pression deformity based on plain film.  To have MRI of the thoracic spine to evaluate further.

## 2021-03-29 ENCOUNTER — Telehealth (HOSPITAL_COMMUNITY): Payer: Self-pay | Admitting: Internal Medicine

## 2021-03-29 NOTE — Addendum Note (Signed)
Encounter addended by: Lance Morin, RN on: 03/29/2021 2:54 PM  Actions taken: Clinical Note Signed

## 2021-03-29 NOTE — Progress Notes (Signed)
Pulmonary Individual Treatment Plan  Patient Details  Name: Anna Valentine MRN: [id number] Date of Birth: 03/13/1951 Referring Provider:   April Manson Pulmonary Rehab Walk Test from 02/08/2021 in [facility]  Referring Provider Dr. Lamonte Sakai       Initial Encounter Date:  Flowsheet Row Pulmonary Rehab Walk Test from 02/08/2021 in [facility]  Date 02/15/21       Visit Diagnosis: Bronchiectasis without complication ([place])  Patient's Home Medications on Admission:   Current Outpatient Medications:    acyclovir (ZOVIRAX) 400 MG tablet, TAKE 1 TABLET BY MOUTH TWICE DAILY, Disp: 120 tablet, Rfl: 0   albuterol (VENTOLIN HFA) 108 (90 Base) MCG/ACT inhaler, INHALE 2 PUFFS INTO LUNGS EVERY 6 HOURS AS NEEDED FOR WHEEZING OR SHORTNESS OF BREATH., Disp: 8.5 g, Rfl: PRN   Clofazimine POWD, 100 mg by Does not apply route every morning., Disp: 3000 g, Rfl: 11   ethambutol (MYAMBUTOL) 400 MG tablet, Take 2 tablets (800 mg total) by mouth daily., Disp: 60 tablet, Rfl: 11   fluticasone (FLONASE) 50 MCG/ACT nasal spray, , Disp: , Rfl:    HYDROcodone-acetaminophen (NORCO) 5-325 MG tablet, One tab with food every 8 hours as needed for severe thoracic back pain, Disp: 15 tablet, Rfl: 0   Multiple Vitamins-Minerals (ONE-A-DAY WOMENS PO), Take by mouth., Disp: , Rfl:    promethazine (PHENERGAN) 12.5 MG tablet, Take 1 tablet (12.5 mg total) by mouth every 8 (eight) hours as needed., Disp: 30 tablet, Rfl: 1   tamoxifen (NOLVADEX) 20 MG tablet, Take 1 tablet (20 mg total) by mouth at bedtime., Disp: 90 tablet, Rfl: 4  Current Facility-Administered Medications:    0.9 %  sodium chloride infusion, 500 mL, Intravenous, Once, Armbruster, Carlota Raspberry, MD  Past Medical History: Past Medical History:  Diagnosis Date   Anemia    hx of IDA   BRCA negative 2011   BRCA I/ II negative   Breast cancer ([place]) 08/2009   LEFT=stage 2, rx with lumpectomy and xrt    Breast cancer ([place]) 2019   RIGHT   Bronchiectasis ([place])    Chronic diastolic CHF (congestive heart failure) ([place]) 02/20/2020   Constipation    Essential hypertension 02/20/2020   Family history of adverse reaction to anesthesia    My Sister has nausea   Family history of breast cancer    Family history of colon cancer    Family history of lung cancer    GERD (gastroesophageal reflux disease)    not presently having symptoms   Hypertension    Iatrogenic pneumothorax 02/06/2019   Lung cancer ([place]) 06/06/05   stage 1 poorly differentiated adenocarcinoma, s/p right lower lobectomy.   Mycobacterium avium complex ([place])    dx 2021   Osteopenia 09/13/2013   Osteoporosis    Personal history of lung cancer    Pneumonia    2007ish, walking pnemonia - 2009 ish   STD (sexually transmitted disease)    HSV   Vitamin D deficiency 09/13/2013    Tobacco Use: Social History   Tobacco Use  Smoking Status Former   Packs/day: 2.00   Years: 18.00   Pack years: 36.00   Types: Cigarettes   Quit date: 07/12/1987   Years since quitting: 33.7  Smokeless Tobacco Never    Labs: Recent Review Flowsheet Data     Labs for ITP Cardiac and Pulmonary Rehab Latest Ref Rng & Units 02/28/2011 09/29/2014 11/07/2017 01/28/2019   Cholestrol <200 mg/dL 208(H) 205(H)  213(H) 191   LDLCALC mg/dL (calc) 119(H) 105(H) 125(H) 105(H)   HDL > OR = 50 mg/dL 80 90 73 71   Trlycerides <150 mg/dL 47 52 56 68       Capillary Blood Glucose: Lab Results  Component Value Date   GLUCAP 90 12/31/2019   GLUCAP 83 01/02/2019     Pulmonary Assessment Scores:  Pulmonary Assessment Scores     Row Name 02/08/21 1007         ADL UCSD   ADL Phase Entry     SOB Score total 29           CAT Score   CAT Score 13           mMRC Score   mMRC Score 3             UCSD: Self-administered rating of dyspnea associated with activities of daily living (ADLs) 6-point scale (0 = "not at all" to 5 = "maximal or unable to do  because of breathlessness")  Scoring Scores range from 0 to 120.  Minimally important difference is 5 units  CAT: CAT can identify the health impairment of COPD patients and is better correlated with disease progression.  CAT has a scoring range of zero to 40. The CAT score is classified into four groups of low (less than 10), medium (10 - 20), high (21-30) and very high (31-40) based on the impact level of disease on health status. A CAT score over 10 suggests significant symptoms.  A worsening CAT score could be explained by an exacerbation, poor medication adherence, poor inhaler technique, or progression of COPD or comorbid conditions.  CAT MCID is 2 points  mMRC: mMRC (Modified Medical Research Council) Dyspnea Scale is used to assess the degree of baseline functional disability in patients of respiratory disease due to dyspnea. No minimal important difference is established. A decrease in score of 1 point or greater is considered a positive change.   Pulmonary Function Assessment:  Pulmonary Function Assessment - 02/08/21 0947       Breath   Bilateral Breath Sounds Clear    Shortness of Breath Yes;Limiting activity             Exercise Target Goals: Exercise Program Goal: Individual exercise prescription set using results from initial 6 min walk test and THRR while considering  patient's activity barriers and safety.   Exercise Prescription Goal: Initial exercise prescription builds to 30-45 minutes a day of aerobic activity, 2-3 days per week.  Home exercise guidelines will be given to patient during program as part of exercise prescription that the participant will acknowledge.  Activity Barriers & Risk Stratification:  Activity Barriers & Cardiac Risk Stratification - 02/08/21 0940       Activity Barriers & Cardiac Risk Stratification   Activity Barriers Deconditioning;Muscular Weakness;Shortness of Breath             6 Minute Walk:  6 Minute Walk     Row  Name 02/08/21 1026         6 Minute Walk   Phase Initial     Distance 1360 feet     Walk Time 6 minutes     # of Rest Breaks 0     MPH 2.58     METS 3.5     RPE 11     Perceived Dyspnea  0     VO2 Peak 12.24     Symptoms No     Resting HR  81 bpm     Resting BP 102/60     Resting Oxygen Saturation  99 %     Exercise Oxygen Saturation  during 6 min walk 98 %     Max Ex. HR 105 bpm     Max Ex. BP 110/60     2 Minute Post BP 108/60           Interval HR   1 Minute HR 95     2 Minute HR 102     3 Minute HR 105     4 Minute HR 101     5 Minute HR 100     6 Minute HR 100     2 Minute Post HR 86     Interval Heart Rate? Yes           Interval Oxygen   Interval Oxygen? Yes     Baseline Oxygen Saturation % 99 %     1 Minute Oxygen Saturation % 98 %     1 Minute Liters of Oxygen 0 L     2 Minute Oxygen Saturation % 100 %     2 Minute Liters of Oxygen 0 L     3 Minute Oxygen Saturation % 100 %     3 Minute Liters of Oxygen 0 L     4 Minute Oxygen Saturation % 100 %     4 Minute Liters of Oxygen 0 L     5 Minute Oxygen Saturation % 100 %     5 Minute Liters of Oxygen 0 L     6 Minute Oxygen Saturation % 99 %     6 Minute Liters of Oxygen 0 L     2 Minute Post Oxygen Saturation % 100 %     2 Minute Post Liters of Oxygen 0 L              Oxygen Initial Assessment:  Oxygen Initial Assessment - 02/08/21 0946       Home Oxygen   Home Oxygen Device None    Sleep Oxygen Prescription None    Home Exercise Oxygen Prescription None    Home Resting Oxygen Prescription None    Compliance with Home Oxygen Use Yes      Initial 6 min Walk   Oxygen Used None      Program Oxygen Prescription   Program Oxygen Prescription None      Intervention   Short Term Goals To learn and understand importance of monitoring SPO2 with pulse oximeter and demonstrate accurate use of the pulse oximeter.;To learn and understand importance of maintaining oxygen saturations>88%;To learn and  demonstrate proper pursed lip breathing techniques or other breathing techniques. ;To learn and demonstrate proper use of respiratory medications    Long  Term Goals Verbalizes importance of monitoring SPO2 with pulse oximeter and return demonstration;Maintenance of O2 saturations>88%;Compliance with respiratory medication;Exhibits proper breathing techniques, such as pursed lip breathing or other method taught during program session;Demonstrates proper use of MDI's             Oxygen Re-Evaluation:  Oxygen Re-Evaluation     Row Name 03/19/21 0911             Program Oxygen Prescription   Program Oxygen Prescription None               Home Oxygen   Home Oxygen Device None       Sleep Oxygen Prescription None  Home Exercise Oxygen Prescription None       Home Resting Oxygen Prescription None       Compliance with Home Oxygen Use Yes               Goals/Expected Outcomes   Short Term Goals To learn and understand importance of monitoring SPO2 with pulse oximeter and demonstrate accurate use of the pulse oximeter.;To learn and understand importance of maintaining oxygen saturations>88%;To learn and demonstrate proper pursed lip breathing techniques or other breathing techniques. ;To learn and demonstrate proper use of respiratory medications       Long  Term Goals Verbalizes importance of monitoring SPO2 with pulse oximeter and return demonstration;Maintenance of O2 saturations>88%;Compliance with respiratory medication;Exhibits proper breathing techniques, such as pursed lip breathing or other method taught during program session;Demonstrates proper use of MDI's       Goals/Expected Outcomes Compliance and understanding of monitoring oxygen saturation and importance of pursed lip breathing                Oxygen Discharge (Final Oxygen Re-Evaluation):  Oxygen Re-Evaluation - 03/19/21 0911       Program Oxygen Prescription   Program Oxygen Prescription None      Home  Oxygen   Home Oxygen Device None    Sleep Oxygen Prescription None    Home Exercise Oxygen Prescription None    Home Resting Oxygen Prescription None    Compliance with Home Oxygen Use Yes      Goals/Expected Outcomes   Short Term Goals To learn and understand importance of monitoring SPO2 with pulse oximeter and demonstrate accurate use of the pulse oximeter.;To learn and understand importance of maintaining oxygen saturations>88%;To learn and demonstrate proper pursed lip breathing techniques or other breathing techniques. ;To learn and demonstrate proper use of respiratory medications    Long  Term Goals Verbalizes importance of monitoring SPO2 with pulse oximeter and return demonstration;Maintenance of O2 saturations>88%;Compliance with respiratory medication;Exhibits proper breathing techniques, such as pursed lip breathing or other method taught during program session;Demonstrates proper use of MDI's    Goals/Expected Outcomes Compliance and understanding of monitoring oxygen saturation and importance of pursed lip breathing             Initial Exercise Prescription:  Initial Exercise Prescription - 02/15/21 0700       Date of Initial Exercise RX and Referring Provider   Date 02/15/21    Referring Provider Dr. Lamonte Sakai    Expected Discharge Date 04/15/21      NuStep   Level 1    SPM 80    Minutes 15      Track   Minutes 15      Prescription Details   Frequency (times per week) 2    Duration Progress to 30 minutes of continuous aerobic without signs/symptoms of physical distress      Intensity   THRR 40-80% of Max Heartrate 60-120    Ratings of Perceived Exertion 11-13    Perceived Dyspnea 0-4      Progression   Progression Continue to progress workloads to maintain intensity without signs/symptoms of physical distress.      Resistance Training   Training Prescription Yes    Weight red bands    Reps 10-15             Perform Capillary Blood Glucose checks  as needed.  Exercise Prescription Changes:   Exercise Prescription Changes     Row Name 02/16/21 1100 03/02/21 1200 03/11/21 1149  Response to Exercise   Blood Pressure (Admit) 120/84 118/76 106/60     Blood Pressure (Exercise) 114/64 112/80 --     Blood Pressure (Exit) 110/74 102/70 104/60     Heart Rate (Admit) 84 bpm 86 bpm 81 bpm     Heart Rate (Exercise) 112 bpm 109 bpm 99 bpm     Heart Rate (Exit) 92 bpm 88 bpm 99 bpm     Oxygen Saturation (Admit) 100 % 99 % 100 %     Oxygen Saturation (Exercise) 97 % 96 % 98 %     Oxygen Saturation (Exit) 98 % 99 % 100 %     Rating of Perceived Exertion (Exercise) _0 Perceived Dyspnea (Exercise) _1 Duration Continue with 30 min of aerobic exercise without signs/symptoms of physical distress. Continue with 30 min of aerobic exercise without signs/symptoms of physical distress. Continue with 30 min of aerobic exercise without signs/symptoms of physical distress.     Intensity Other (comment)  40-80% of HRR THRR unchanged THRR unchanged           Progression   Progression Continue to progress workloads to maintain intensity without signs/symptoms of physical distress. Continue to progress workloads to maintain intensity without signs/symptoms of physical distress. Continue to progress workloads to maintain intensity without signs/symptoms of physical distress.           Resistance Training   Training Prescription Yes Yes Yes     Weight Red bands Red bands Red Bands     Reps 10-15 10-15 10-15     Time 10 Minutes 10 Minutes 10 Minutes           Treadmill   MPH 1._2 Grade 0 1.5 3.5     Minutes _3 NuStep   Level _4 SPM 80 80 80     Minutes _5 METs 2 2.6 3              Exercise Comments:   Exercise Goals and Review:   Exercise Goals     Row Name 02/15/21 0729 02/23/21 7858 03/19/21 0911         Exercise Goals   Increase Physical Activity Yes Yes Yes      Intervention Provide advice, education, support and counseling about physical activity/exercise needs.;Develop an individualized exercise prescription for aerobic and resistive training based on initial evaluation findings, risk stratification, comorbidities and participant's personal goals. Provide advice, education, support and counseling about physical activity/exercise needs.;Develop an individualized exercise prescription for aerobic and resistive training based on initial evaluation findings, risk stratification, comorbidities and participant's personal goals. Provide advice, education, support and counseling about physical activity/exercise needs.;Develop an individualized exercise prescription for aerobic and resistive training based on initial evaluation findings, risk stratification, comorbidities and participant's personal goals.     Expected Outcomes Short Term: Attend rehab on a regular basis to increase amount of physical activity.;Long Term: Add in home exercise to make exercise part of routine and to increase amount of physical activity.;Long Term: Exercising regularly at least 3-5 days a week. Short Term: Attend rehab on a regular basis to increase amount of physical activity.;Long Term: Add in home exercise to make exercise part of routine and to increase amount of physical activity.;Long Term: Exercising regularly at least 3-5 days  a week. Short Term: Attend rehab on a regular basis to increase amount of physical activity.;Long Term: Add in home exercise to make exercise part of routine and to increase amount of physical activity.;Long Term: Exercising regularly at least 3-5 days a week.     Increase Strength and Stamina Yes Yes Yes     Intervention Provide advice, education, support and counseling about physical activity/exercise needs.;Develop an individualized exercise prescription for aerobic and resistive training based on initial evaluation findings, risk stratification, comorbidities and  participant's personal goals. Provide advice, education, support and counseling about physical activity/exercise needs.;Develop an individualized exercise prescription for aerobic and resistive training based on initial evaluation findings, risk stratification, comorbidities and participant's personal goals. Provide advice, education, support and counseling about physical activity/exercise needs.;Develop an individualized exercise prescription for aerobic and resistive training based on initial evaluation findings, risk stratification, comorbidities and participant's personal goals.     Expected Outcomes Short Term: Increase workloads from initial exercise prescription for resistance, speed, and METs.;Short Term: Perform resistance training exercises routinely during rehab and add in resistance training at home;Long Term: Improve cardiorespiratory fitness, muscular endurance and strength as measured by increased METs and functional capacity (6MWT) Short Term: Increase workloads from initial exercise prescription for resistance, speed, and METs.;Short Term: Perform resistance training exercises routinely during rehab and add in resistance training at home;Long Term: Improve cardiorespiratory fitness, muscular endurance and strength as measured by increased METs and functional capacity (6MWT) Short Term: Increase workloads from initial exercise prescription for resistance, speed, and METs.;Short Term: Perform resistance training exercises routinely during rehab and add in resistance training at home;Long Term: Improve cardiorespiratory fitness, muscular endurance and strength as measured by increased METs and functional capacity (6MWT)     Able to understand and use rate of perceived exertion (RPE) scale Yes Yes Yes     Intervention Provide education and explanation on how to use RPE scale Provide education and explanation on how to use RPE scale Provide education and explanation on how to use RPE scale      Expected Outcomes Short Term: Able to use RPE daily in rehab to express subjective intensity level;Long Term:  Able to use RPE to guide intensity level when exercising independently Short Term: Able to use RPE daily in rehab to express subjective intensity level;Long Term:  Able to use RPE to guide intensity level when exercising independently Short Term: Able to use RPE daily in rehab to express subjective intensity level;Long Term:  Able to use RPE to guide intensity level when exercising independently     Able to understand and use Dyspnea scale Yes Yes Yes     Intervention Provide education and explanation on how to use Dyspnea scale Provide education and explanation on how to use Dyspnea scale Provide education and explanation on how to use Dyspnea scale     Expected Outcomes Short Term: Able to use Dyspnea scale daily in rehab to express subjective sense of shortness of breath during exertion;Long Term: Able to use Dyspnea scale to guide intensity level when exercising independently Short Term: Able to use Dyspnea scale daily in rehab to express subjective sense of shortness of breath during exertion;Long Term: Able to use Dyspnea scale to guide intensity level when exercising independently Short Term: Able to use Dyspnea scale daily in rehab to express subjective sense of shortness of breath during exertion;Long Term: Able to use Dyspnea scale to guide intensity level when exercising independently     Knowledge and understanding of Target Heart Rate  Range (THRR) Yes Yes Yes     Intervention Provide education and explanation of THRR including how the numbers were predicted and where they are located for reference Provide education and explanation of THRR including how the numbers were predicted and where they are located for reference Provide education and explanation of THRR including how the numbers were predicted and where they are located for reference     Expected Outcomes Short Term: Able to  state/look up THRR;Long Term: Able to use THRR to govern intensity when exercising independently;Short Term: Able to use daily as guideline for intensity in rehab Short Term: Able to state/look up THRR;Short Term: Able to use daily as guideline for intensity in rehab;Long Term: Able to use THRR to govern intensity when exercising independently Short Term: Able to state/look up THRR;Short Term: Able to use daily as guideline for intensity in rehab;Long Term: Able to use THRR to govern intensity when exercising independently     Understanding of Exercise Prescription Yes Yes Yes     Intervention Provide education, explanation, and written materials on patient's individual exercise prescription Provide education, explanation, and written materials on patient's individual exercise prescription Provide education, explanation, and written materials on patient's individual exercise prescription     Expected Outcomes Short Term: Able to explain program exercise prescription;Long Term: Able to explain home exercise prescription to exercise independently Short Term: Able to explain program exercise prescription;Long Term: Able to explain home exercise prescription to exercise independently Short Term: Able to explain program exercise prescription;Long Term: Able to explain home exercise prescription to exercise independently              Exercise Goals Re-Evaluation :  Exercise Goals Re-Evaluation     Row Name 02/23/21 0924 03/19/21 0912           Exercise Goal Re-Evaluation   Exercise Goals Review Increase Physical Activity;Increase Strength and Stamina;Able to understand and use rate of perceived exertion (RPE) scale;Able to understand and use Dyspnea scale;Knowledge and understanding of Target Heart Rate Range (THRR);Understanding of Exercise Prescription Increase Physical Activity;Increase Strength and Stamina;Able to understand and use rate of perceived exertion (RPE) scale;Able to understand and use  Dyspnea scale;Knowledge and understanding of Target Heart Rate Range (THRR);Understanding of Exercise Prescription      Comments Venetta has attended 2 exercise sessions. She is deconditioned but tolerates exercise well. Galadriel performs the warm up and resistance exercises standing up. Will continue to monitor and progress as necessary. Wyvonne has attended 8 exercise sessions. She tolerates exercise well on the Nustep and treadmill. Minerva has progressed workloads on both pieces of equipment. She averages 3.1 METs at level 4 on the Nustep and 4.74 METs at 3.0 mph @ 3.5% on the treadmill. Walida is motivated to exercise. She performs the warmup and cooldown standing up. Will continue to monitor and progress as able.      Expected Outcomes Through exercise at rehab and home, the pt will decrease SOB with daily activities and feel confident in carrying out an exercise regimen at home. Through exercise at rehab and home, the pt will decrease SOB with daily activities and feel confident in carrying out an exercise regimen at home.               Discharge Exercise Prescription (Final Exercise Prescription Changes):  Exercise Prescription Changes - 03/11/21 1149       Response to Exercise   Blood Pressure (Admit) 106/60    Blood Pressure (Exit) 104/60    Heart  Rate (Admit) 81 bpm    Heart Rate (Exercise) 99 bpm    Heart Rate (Exit) 99 bpm    Oxygen Saturation (Admit) 100 %    Oxygen Saturation (Exercise) 98 %    Oxygen Saturation (Exit) 100 %    Rating of Perceived Exertion (Exercise) 11    Perceived Dyspnea (Exercise) 1    Duration Continue with 30 min of aerobic exercise without signs/symptoms of physical distress.    Intensity THRR unchanged      Progression   Progression Continue to progress workloads to maintain intensity without signs/symptoms of physical distress.      Resistance Training   Training Prescription Yes    Weight Red Bands    Reps 10-15    Time 10 Minutes       Treadmill   MPH 3    Grade 3.5    Minutes 15      NuStep   Level 4    SPM 80    Minutes 15    METs 3             Nutrition:  Target Goals: Understanding of nutrition guidelines, daily intake of sodium <155m, cholesterol <204m calories 30% from fat and 7% or less from saturated fats, daily to have 5 or more servings of fruits and vegetables.  Biometrics:  Pre Biometrics - 02/08/21 0945       Pre Biometrics   Height _0  (1.676 m)    Weight 48.7 kg    BMI (Calculated) 17.34    Grip Strength 21 kg              Nutrition Therapy Plan and Nutrition Goals:  Nutrition Therapy & Goals - 02/18/21 1321       Nutrition Therapy   Diet General Healthful      Personal Nutrition Goals   Nutrition Goal Pt to identify food quantities necessary to achieve weight gain of 0.5 lb per week at graduation from pulmonary rehab.    Personal Goal #2 Pt to build a healthy plate including vegetables, fruits, whole grains, and low-fat dairy products in a heart healthy meal plan.    Personal Goal #3 Pt to eat snacks/mini meals between meals such as 1 cup milk or 1 oz nuts      Intervention Plan   Intervention Prescribe, educate and counsel regarding individualized specific dietary modifications aiming towards targeted core components such as weight, hypertension, lipid management, diabetes, heart failure and other comorbidities.;Nutrition handout(s) given to patient.    Expected Outcomes Long Term Goal: Adherence to prescribed nutrition plan.;Short Term Goal: A plan has been developed with personal nutrition goals set during dietitian appointment.             Nutrition Assessments:  MEDIFICTS Score Key: ?70 Need to make dietary changes  40-70 Heart Healthy Diet ? 40 Therapeutic Level Cholesterol Diet  Flowsheet Row PULMONARY REHAB OTHER RESPIRATORY [facility] Total Score on Admission 42      Picture  Your Plate Scores: <4<33nhealthy dietary pattern with much room for improvement. 41-50 Dietary pattern unlikely to meet recommendations for good health and room for improvement. 51-60 More healthful dietary pattern, with some room for improvement.  >60 Healthy dietary pattern, although there may be some specific behaviors that could be improved.    Nutrition Goals Re-Evaluation:  Nutrition Goals Re-Evaluation     [facility] 02/18[facility]21 1322 03/26/21 1417  Goals   Current Weight 108 lb (49 kg) 109 lb 5.6 oz (49.6 kg)      Nutrition Goal Pt to identify food quantities necessary to achieve weight gain of 0.5 lb per week at graduation from pulmonary rehab. Pt to identify food quantities necessary to achieve weight gain of 0.5 lb per week at graduation from pulmonary rehab.             Personal Goal #2 Re-Evaluation   Personal Goal #2 Pt to build a healthy plate including vegetables, fruits, whole grains, and low-fat dairy products in a heart healthy meal plan. Pt to build a healthy plate including vegetables, fruits, whole grains, and low-fat dairy products in a heart healthy meal plan.             Personal Goal #3 Re-Evaluation   Personal Goal #3 Pt to eat snacks/mini meals between meals such as 1 cup milk or 1 oz nuts Pt to eat snacks/mini meals between meals such as 1 cup milk or 1 oz nuts               Nutrition Goals Discharge (Final Nutrition Goals Re-Evaluation):  Nutrition Goals Re-Evaluation - 03/26/21 1417       Goals   Current Weight 109 lb 5.6 oz (49.6 kg)    Nutrition Goal Pt to identify food quantities necessary to achieve weight gain of 0.5 lb per week at graduation from pulmonary rehab.      Personal Goal #2 Re-Evaluation   Personal Goal #2 Pt to build a healthy plate including vegetables, fruits, whole grains, and low-fat dairy products in a heart healthy meal plan.      Personal Goal #3 Re-Evaluation   Personal Goal #3 Pt to eat snacks/mini meals between  meals such as 1 cup milk or 1 oz nuts             Psychosocial: Target Goals: Acknowledge presence or absence of significant depression and/or stress, maximize coping skills, provide positive support system. Participant is able to verbalize types and ability to use techniques and skills needed for reducing stress and depression.  Initial Review & Psychosocial Screening:  Initial Psych Review & Screening - 02/08/21 0948       Initial Review   Current issues with None Identified      Family Dynamics   Good Support System? Yes   Lots of friends and church members     Barriers   Psychosocial barriers to participate in program There are no identifiable barriers or psychosocial needs.      Screening Interventions   Interventions Encouraged to exercise             Quality of Life Scores:  Scores of 19 and below usually indicate a poorer quality of life in these areas.  A difference of  2-3 points is a clinically meaningful difference.  A difference of 2-3 points in the total score of the Quality of Life Index has been associated with significant improvement in overall quality of life, self-image, physical symptoms, and general health in studies assessing change in quality of life.  PHQ-9: Recent Review Flowsheet Data     Depression screen [facility] 2/9 02/08/2021 08/11/2020 07/22/2020 06/25/2020 04/23/2020   Decreased Interest 0 0 0 0 0   Down, Depressed, Hopeless 0 0 0 0 0   PHQ - 2 Score 0 0 0 0 0   Altered sleeping 0 - - - -   Tired, decreased energy 0 - - - -  Change in appetite 0 - - - -   Feeling bad or failure about yourself  0 - - - -   Trouble concentrating 0 - - - -   Moving slowly or fidgety/restless 0 - - - -   Suicidal thoughts 0 - - - -   Difficult doing work/chores Not difficult at all - - - -      Interpretation of Total Score  Total Score Depression Severity:  1-4 = Minimal depression, 5-9 = Mild depression, 10-14 = Moderate depression, 15-19 = Moderately  severe depression, 20-27 = Severe depression   Psychosocial Evaluation and Intervention:  Psychosocial Evaluation - 02/08/21 0951       Psychosocial Evaluation & Interventions   Interventions Encouraged to exercise with the program and follow exercise prescription    Comments No concerns identified at this time.    Expected Outcomes To be free of psychosocial concerns while participating in pulmonary rehab.    Continue Psychosocial Services  No Follow up required             Psychosocial Re-Evaluation:  Psychosocial Re-Evaluation     [place] Name 02/22/21 1247 03/22/21 3329           Psychosocial Re-Evaluation   Current issues with None Identified None Identified      Comments No psychosocial concerns identified at this time, she has a positive attitude and seems to be enjoying exercising in pulmonary rehab. No psychosocial concerns identified at this time.      Expected Outcomes For Shikara to continue to be free of psychosocial concerns while participating in pulmonary rehab. For Vincenza to continue to have no psychosocial concerns while participating in pulmonary rehab.      Interventions Encouraged to attend Pulmonary Rehabilitation for the exercise Encouraged to attend Pulmonary Rehabilitation for the exercise      Continue Psychosocial Services  No Follow up required No Follow up required               Psychosocial Discharge (Final Psychosocial Re-Evaluation):  Psychosocial Re-Evaluation - 03/22/21 5188       Psychosocial Re-Evaluation   Current issues with None Identified    Comments No psychosocial concerns identified at this time.    Expected Outcomes For Deven to continue to have no psychosocial concerns while participating in pulmonary rehab.    Interventions Encouraged to attend Pulmonary Rehabilitation for the exercise    Continue Psychosocial Services  No Follow up required             Education: Education Goals: Education classes will be  provided on a weekly basis, covering required topics. Participant will state understanding/return demonstration of topics presented.  Learning Barriers/Preferences:  Learning Barriers/Preferences - 02/08/21 4166       Learning Barriers/Preferences   Learning Barriers None    Learning Preferences Individual Instruction;Skilled Demonstration;Group Instruction;Written Material             Education Topics: Risk Factor Reduction:  -Group instruction that is supported by a PowerPoint presentation. Instructor discusses the definition of a risk factor, different risk factors for pulmonary disease, and how the heart and lungs work together.     Nutrition for Pulmonary Patient:  -Group instruction provided by PowerPoint slides, verbal discussion, and written materials to support subject matter. The instructor gives an explanation and review of healthy diet recommendations, which includes a discussion on weight management, recommendations for fruit and vegetable consumption, as well as protein, fluid, caffeine, fiber, sodium, sugar, and alcohol. Tips  for eating when patients are short of breath are discussed.   Pursed Lip Breathing:  -Group instruction that is supported by demonstration and informational handouts. Instructor discusses the benefits of pursed lip and diaphragmatic breathing and detailed demonstration on how to preform both.     Oxygen Safety:  -Group instruction provided by PowerPoint, verbal discussion, and written material to support subject matter. There is an overview of "What is Oxygen" and "Why do we need it".  Instructor also reviews how to create a safe environment for oxygen use, the importance of using oxygen as prescribed, and the risks of noncompliance. There is a brief discussion on traveling with oxygen and resources the patient may utilize.   Oxygen Equipment:  -Group instruction provided by [facility] Staff utilizing handouts, written materials, and equipment  demonstrations.   Signs and Symptoms:  -Group instruction provided by written material and verbal discussion to support subject matter. Warning signs and symptoms of infection, stroke, and heart attack are reviewed and when to call the physician/911 reinforced. Tips for preventing the spread of infection discussed.   Advanced Directives:  -Group instruction provided by verbal instruction and written material to support subject matter. Instructor reviews Advanced Directive laws and proper instruction for filling out document.   Pulmonary Video:  -Group video education that reviews the importance of medication and oxygen compliance, exercise, good nutrition, pulmonary hygiene, and pursed lip and diaphragmatic breathing for the pulmonary patient.   Exercise for the Pulmonary Patient:  -Group instruction that is supported by a PowerPoint presentation. Instructor discusses benefits of exercise, core components of exercise, frequency, duration, and intensity of an exercise routine, importance of utilizing pulse oximetry during exercise, safety while exercising, and options of places to exercise outside of rehab.     Pulmonary Medications:  -Verbally interactive group education provided by instructor with focus on inhaled medications and proper administration.   Anatomy and Physiology of the Respiratory System and Intimacy:  -Group instruction provided by PowerPoint, verbal discussion, and written material to support subject matter. Instructor reviews respiratory cycle and anatomical components of the respiratory system and their functions. Instructor also reviews differences in obstructive and restrictive respiratory diseases with examples of each. Intimacy, Sex, and Sexuality differences are reviewed with a discussion on how relationships can change when diagnosed with pulmonary disease. Common sexual concerns are reviewed. Flowsheet Row PULMONARY REHAB OTHER RESPIRATORY from 03/11/2021 in [place]  Date 03/11/21  Educator Handout  [Beat a Sedentary Lifestyle]       MD DAY -A group question and answer session with a medical doctor that allows participants to ask questions that relate to their pulmonary disease state.   OTHER EDUCATION -Group or individual verbal, written, or video instructions that support the educational goals of the pulmonary rehab program. Boyd from 03/11/2021 in [place]  Date 02/23/21  Educator Handout  [facility] Reading]       Holiday Eating Survival Tips:  -Group instruction provided by PowerPoint slides, verbal discussion, and written materials to support subject matter. The instructor gives patients tips, tricks, and techniques to help them not only survive but enjoy the holidays despite the onslaught of food that accompanies the holidays.   Knowledge Questionnaire Score:  Knowledge Questionnaire Score - 02/08/21 1008       Knowledge Questionnaire Score   Pre Score 18/18             Core Components/Risk Factors/Patient Goals  at Admission:  Personal Goals and Risk Factors at Admission - 02/08/21 0952       Core Components/Risk Factors/Patient Goals on Admission    Weight Management Weight Gain    Improve shortness of breath with ADL's Yes    Intervention Provide education, individualized exercise plan and daily activity instruction to help decrease symptoms of SOB with activities of daily living.    Expected Outcomes Short Term: Improve cardiorespiratory fitness to achieve a reduction of symptoms when performing ADLs;Long Term: Be able to perform more ADLs without symptoms or delay the onset of symptoms             Core Components/Risk Factors/Patient Goals Review:   Goals and Risk Factor Review     Row Name 02/08/21 0953 02/22/21 1254 03/22/21 0920         Core Components/Risk Factors/Patient Goals Review   Personal  Goals Review Develop more efficient breathing techniques such as purse lipped breathing and diaphragmatic breathing and practicing self-pacing with activity.;Increase knowledge of respiratory medications and ability to use respiratory devices properly.;Improve shortness of breath with ADL's Develop more efficient breathing techniques such as purse lipped breathing and diaphragmatic breathing and practicing self-pacing with activity.;Increase knowledge of respiratory medications and ability to use respiratory devices properly.;Improve shortness of breath with ADL's Develop more efficient breathing techniques such as purse lipped breathing and diaphragmatic breathing and practicing self-pacing with activity.;Increase knowledge of respiratory medications and ability to use respiratory devices properly.;Improve shortness of breath with ADL's     Review -- Maritza has attended 2 exercise sessions, she tolerated them well, but it is too early to see progression toward goals yet. Vicke has completed 4 weeks of exercise in pulmonary rehab.  She is progressing well and has increased her strenth and stamina on both the treadmill and nustep.  She seems to enjoy participating, is very positive, and is improving her shortness of breath and has learned breathing techniques which aid her in her daily activities and exercise.     Expected Outcomes -- See admission goals. Ass admission goals.              Core Components/Risk Factors/Patient Goals at Discharge (Final Review):   Goals and Risk Factor Review - 03/22/21 0920       Core Components/Risk Factors/Patient Goals Review   Personal Goals Review Develop more efficient breathing techniques such as purse lipped breathing and diaphragmatic breathing and practicing self-pacing with activity.;Increase knowledge of respiratory medications and ability to use respiratory devices properly.;Improve shortness of breath with ADL's    Review Amena has completed 4  weeks of exercise in pulmonary rehab.  She is progressing well and has increased her strenth and stamina on both the treadmill and nustep.  She seems to enjoy participating, is very positive, and is improving her shortness of breath and has learned breathing techniques which aid her in her daily activities and exercise.    Expected Outcomes Ass admission goals.             ITP Comments:   Comments: ITP REVIEW Pt is making expected progress toward pulmonary rehab goals after completing 8 sessions. Recommend continued exercise, life style modification, education, and utilization of breathing techniques to increase stamina and strength and decrease shortness of breath with exertion.

## 2021-03-30 ENCOUNTER — Ambulatory Visit (HOSPITAL_COMMUNITY): Payer: Medicare Other

## 2021-03-30 ENCOUNTER — Encounter (HOSPITAL_COMMUNITY): Payer: Medicare Other

## 2021-03-30 ENCOUNTER — Telehealth (HOSPITAL_COMMUNITY): Payer: Self-pay | Admitting: *Deleted

## 2021-03-31 ENCOUNTER — Ambulatory Visit (HOSPITAL_COMMUNITY): Payer: Medicare Other

## 2021-04-01 ENCOUNTER — Encounter (HOSPITAL_COMMUNITY): Payer: Medicare Other

## 2021-04-01 ENCOUNTER — Ambulatory Visit (HOSPITAL_COMMUNITY): Payer: Medicare Other

## 2021-04-01 NOTE — Addendum Note (Signed)
Encounter addended by: Deon Pilling, RN on: 04/01/2021 10:03 AM  Actions taken: Clinical Note Signed, Episode resolved

## 2021-04-01 NOTE — Progress Notes (Signed)
Discharge Progress Report  Patient Details  Name: Anna Valentine MRN: [id number] Date of Birth: 13-May-1951 Referring Provider:   [facility] Walk Test from 02/08/2021 in [place]  Referring Provider Dr. Lamonte Sakai        Number of Visits: 8  Reason for Discharge:  Early Exit:  Pt had spinal compression fracture and was unable to exercise due to doctor's orders.  Smoking History:  Social History   Tobacco Use  Smoking Status Former   Packs/day: 2.00   Years: 18.00   Pack years: 36.00   Types: Cigarettes   Quit date: 07/12/1987   Years since quitting: 33.7  Smokeless Tobacco Never    Diagnosis:  Bronchiectasis without complication ([place])  ADL [facility]SD:  Pulmonary Assessment Scores     Row Name 02/08/21 1007         ADL UCSD   ADL Phase Entry     SOB Score total 29           CAT Score   CAT Score 13           mMRC Score   mMRC Score 3              Initial Exercise Prescription:  Initial Exercise Prescription - 02/15/21 0700       Date of Initial Exercise RX and Referring Provider   Date 02/15/21    Referring Provider Dr. Lamonte Sakai    Expected Discharge Date 04/15/21      NuStep   Level 1    SPM 80    Minutes 15      Track   Minutes 15      Prescription Details   Frequency (times per week) 2    Duration Progress to 30 minutes of continuous aerobic without signs/symptoms of physical distress      Intensity   THRR 40-80% of Max Heartrate 60-120    Ratings of Perceived Exertion 11-13    Perceived Dyspnea 0-4      Progression   Progression Continue to progress workloads to maintain intensity without signs/symptoms of physical distress.      Resistance Training   Training Prescription Yes    Weight red bands    Reps 10-15             Discharge Exercise Prescription (Final Exercise Prescription Changes):  Exercise Prescription Changes - 03/11/21 1149       Response to Exercise   Blood  Pressure (Admit) 106/60    Blood Pressure (Exit) 104/60    Heart Rate (Admit) 81 bpm    Heart Rate (Exercise) 99 bpm    Heart Rate (Exit) 99 bpm    Oxygen Saturation (Admit) 100 %    Oxygen Saturation (Exercise) 98 %    Oxygen Saturation (Exit) 100 %    Rating of Perceived Exertion (Exercise) 11    Perceived Dyspnea (Exercise) 1    Duration Continue with 30 min of aerobic exercise without signs/symptoms of physical distress.    Intensity THRR unchanged      Progression   Progression Continue to progress workloads to maintain intensity without signs/symptoms of physical distress.      Resistance Training   Training Prescription Yes    Weight Red Bands    Reps 10-15    Time 10 Minutes      Treadmill   MPH 3    Grade 3.5    Minutes 15      NuStep  Level 4    SPM 80    Minutes 15    METs 3             Functional Capacity:  6 Minute Walk     Row Name 02/08/21 1026         6 Minute Walk   Phase Initial     Distance 1360 feet     Walk Time 6 minutes     # of Rest Breaks 0     MPH 2.58     METS 3.5     RPE 11     Perceived Dyspnea  0     VO2 Peak 12.24     Symptoms No     Resting HR 81 bpm     Resting BP 102/60     Resting Oxygen Saturation  99 %     Exercise Oxygen Saturation  during 6 min walk 98 %     Max Ex. HR 105 bpm     Max Ex. BP 110/60     2 Minute Post BP 108/60           Interval HR   1 Minute HR 95     2 Minute HR 102     3 Minute HR 105     4 Minute HR 101     5 Minute HR 100     6 Minute HR 100     2 Minute Post HR 86     Interval Heart Rate? Yes           Interval Oxygen   Interval Oxygen? Yes     Baseline Oxygen Saturation % 99 %     1 Minute Oxygen Saturation % 98 %     1 Minute Liters of Oxygen 0 L     2 Minute Oxygen Saturation % 100 %     2 Minute Liters of Oxygen 0 L     3 Minute Oxygen Saturation % 100 %     3 Minute Liters of Oxygen 0 L     4 Minute Oxygen Saturation % 100 %     4 Minute Liters of Oxygen 0 L     5  Minute Oxygen Saturation % 100 %     5 Minute Liters of Oxygen 0 L     6 Minute Oxygen Saturation % 99 %     6 Minute Liters of Oxygen 0 L     2 Minute Post Oxygen Saturation % 100 %     2 Minute Post Liters of Oxygen 0 L              Psychological, QOL, Others - Outcomes: PHQ 2/9: Depression screen [facility] 2/9 02/08/2021 08/11/2020 07/22/2020 06/25/2020 04/23/2020  Decreased Interest 0 0 0 0 0  Down, Depressed, Hopeless 0 0 0 0 0  PHQ - 2 Score 0 0 0 0 0  Altered sleeping 0 - - - -  Tired, decreased energy 0 - - - -  Change in appetite 0 - - - -  Feeling bad or failure about yourself  0 - - - -  Trouble concentrating 0 - - - -  Moving slowly or fidgety/restless 0 - - - -  Suicidal thoughts 0 - - - -  Difficult doing work/chores Not difficult at all - - - -  Some recent data might be hidden    Quality of Life:   Personal Goals: Goals established at orientation with  interventions provided to work toward goal.  Personal Goals and Risk Factors at Admission - 02/08/21 0952       Core Components/Risk Factors/Patient Goals on Admission    Weight Management Weight Gain    Improve shortness of breath with ADL's Yes    Intervention Provide education, individualized exercise plan and daily activity instruction to help decrease symptoms of SOB with activities of daily living.    Expected Outcomes Short Term: Improve cardiorespiratory fitness to achieve a reduction of symptoms when performing ADLs;Long Term: Be able to perform more ADLs without symptoms or delay the onset of symptoms              Personal Goals Discharge:  Goals and Risk Factor Review     Row Name 02/08/21 0953 02/22/21 1254 03/22/21 0920         Core Components/Risk Factors/Patient Goals Review   Personal Goals Review Develop more efficient breathing techniques such as purse lipped breathing and diaphragmatic breathing and practicing self-pacing with activity.;Increase knowledge of respiratory medications and  ability to use respiratory devices properly.;Improve shortness of breath with ADL's Develop more efficient breathing techniques such as purse lipped breathing and diaphragmatic breathing and practicing self-pacing with activity.;Increase knowledge of respiratory medications and ability to use respiratory devices properly.;Improve shortness of breath with ADL's Develop more efficient breathing techniques such as purse lipped breathing and diaphragmatic breathing and practicing self-pacing with activity.;Increase knowledge of respiratory medications and ability to use respiratory devices properly.;Improve shortness of breath with ADL's     Review -- Micole has attended 2 exercise sessions, she tolerated them well, but it is too early to see progression toward goals yet. Marceil has completed 4 weeks of exercise in pulmonary rehab.  She is progressing well and has increased her strenth and stamina on both the treadmill and nustep.  She seems to enjoy participating, is very positive, and is improving her shortness of breath and has learned breathing techniques which aid her in her daily activities and exercise.     Expected Outcomes -- See admission goals. Ass admission goals.              Exercise Goals and Review:  Exercise Goals     Row Name 02/15/21 0729 02/23/21 0922 03/19/21 0911         Exercise Goals   Increase Physical Activity Yes Yes Yes     Intervention Provide advice, education, support and counseling about physical activity/exercise needs.;Develop an individualized exercise prescription for aerobic and resistive training based on initial evaluation findings, risk stratification, comorbidities and participant's personal goals. Provide advice, education, support and counseling about physical activity/exercise needs.;Develop an individualized exercise prescription for aerobic and resistive training based on initial evaluation findings, risk stratification, comorbidities and participant's  personal goals. Provide advice, education, support and counseling about physical activity/exercise needs.;Develop an individualized exercise prescription for aerobic and resistive training based on initial evaluation findings, risk stratification, comorbidities and participant's personal goals.     Expected Outcomes Short Term: Attend rehab on a regular basis to increase amount of physical activity.;Long Term: Add in home exercise to make exercise part of routine and to increase amount of physical activity.;Long Term: Exercising regularly at least 3-5 days a week. Short Term: Attend rehab on a regular basis to increase amount of physical activity.;Long Term: Add in home exercise to make exercise part of routine and to increase amount of physical activity.;Long Term: Exercising regularly at least 3-5 days a week. Short Term: Attend rehab on a  regular basis to increase amount of physical activity.;Long Term: Add in home exercise to make exercise part of routine and to increase amount of physical activity.;Long Term: Exercising regularly at least 3-5 days a week.     Increase Strength and Stamina Yes Yes Yes     Intervention Provide advice, education, support and counseling about physical activity/exercise needs.;Develop an individualized exercise prescription for aerobic and resistive training based on initial evaluation findings, risk stratification, comorbidities and participant's personal goals. Provide advice, education, support and counseling about physical activity/exercise needs.;Develop an individualized exercise prescription for aerobic and resistive training based on initial evaluation findings, risk stratification, comorbidities and participant's personal goals. Provide advice, education, support and counseling about physical activity/exercise needs.;Develop an individualized exercise prescription for aerobic and resistive training based on initial evaluation findings, risk stratification, comorbidities  and participant's personal goals.     Expected Outcomes Short Term: Increase workloads from initial exercise prescription for resistance, speed, and METs.;Short Term: Perform resistance training exercises routinely during rehab and add in resistance training at home;Long Term: Improve cardiorespiratory fitness, muscular endurance and strength as measured by increased METs and functional capacity (6MWT) Short Term: Increase workloads from initial exercise prescription for resistance, speed, and METs.;Short Term: Perform resistance training exercises routinely during rehab and add in resistance training at home;Long Term: Improve cardiorespiratory fitness, muscular endurance and strength as measured by increased METs and functional capacity (6MWT) Short Term: Increase workloads from initial exercise prescription for resistance, speed, and METs.;Short Term: Perform resistance training exercises routinely during rehab and add in resistance training at home;Long Term: Improve cardiorespiratory fitness, muscular endurance and strength as measured by increased METs and functional capacity (6MWT)     Able to understand and use rate of perceived exertion (RPE) scale Yes Yes Yes     Intervention Provide education and explanation on how to use RPE scale Provide education and explanation on how to use RPE scale Provide education and explanation on how to use RPE scale     Expected Outcomes Short Term: Able to use RPE daily in rehab to express subjective intensity level;Long Term:  Able to use RPE to guide intensity level when exercising independently Short Term: Able to use RPE daily in rehab to express subjective intensity level;Long Term:  Able to use RPE to guide intensity level when exercising independently Short Term: Able to use RPE daily in rehab to express subjective intensity level;Long Term:  Able to use RPE to guide intensity level when exercising independently     Able to understand and use Dyspnea scale Yes  Yes Yes     Intervention Provide education and explanation on how to use Dyspnea scale Provide education and explanation on how to use Dyspnea scale Provide education and explanation on how to use Dyspnea scale     Expected Outcomes Short Term: Able to use Dyspnea scale daily in rehab to express subjective sense of shortness of breath during exertion;Long Term: Able to use Dyspnea scale to guide intensity level when exercising independently Short Term: Able to use Dyspnea scale daily in rehab to express subjective sense of shortness of breath during exertion;Long Term: Able to use Dyspnea scale to guide intensity level when exercising independently Short Term: Able to use Dyspnea scale daily in rehab to express subjective sense of shortness of breath during exertion;Long Term: Able to use Dyspnea scale to guide intensity level when exercising independently     Knowledge and understanding of Target Heart Rate Range (THRR) Yes Yes Yes  Intervention Provide education and explanation of THRR including how the numbers were predicted and where they are located for reference Provide education and explanation of THRR including how the numbers were predicted and where they are located for reference Provide education and explanation of THRR including how the numbers were predicted and where they are located for reference     Expected Outcomes Short Term: Able to state/look up THRR;Long Term: Able to use THRR to govern intensity when exercising independently;Short Term: Able to use daily as guideline for intensity in rehab Short Term: Able to state/look up THRR;Short Term: Able to use daily as guideline for intensity in rehab;Long Term: Able to use THRR to govern intensity when exercising independently Short Term: Able to state/look up THRR;Short Term: Able to use daily as guideline for intensity in rehab;Long Term: Able to use THRR to govern intensity when exercising independently     Understanding of Exercise  Prescription Yes Yes Yes     Intervention Provide education, explanation, and written materials on patient's individual exercise prescription Provide education, explanation, and written materials on patient's individual exercise prescription Provide education, explanation, and written materials on patient's individual exercise prescription     Expected Outcomes Short Term: Able to explain program exercise prescription;Long Term: Able to explain home exercise prescription to exercise independently Short Term: Able to explain program exercise prescription;Long Term: Able to explain home exercise prescription to exercise independently Short Term: Able to explain program exercise prescription;Long Term: Able to explain home exercise prescription to exercise independently              Exercise Goals Re-Evaluation:  Exercise Goals Re-Evaluation     Row Name 02/23/21 0924 03/19/21 0912           Exercise Goal Re-Evaluation   Exercise Goals Review Increase Physical Activity;Increase Strength and Stamina;Able to understand and use rate of perceived exertion (RPE) scale;Able to understand and use Dyspnea scale;Knowledge and understanding of Target Heart Rate Range (THRR);Understanding of Exercise Prescription Increase Physical Activity;Increase Strength and Stamina;Able to understand and use rate of perceived exertion (RPE) scale;Able to understand and use Dyspnea scale;Knowledge and understanding of Target Heart Rate Range (THRR);Understanding of Exercise Prescription      Comments Baylei has attended 2 exercise sessions. She is deconditioned but tolerates exercise well. Myrikal performs the warm up and resistance exercises standing up. Will continue to monitor and progress as necessary. Katheryne has attended 8 exercise sessions. She tolerates exercise well on the Nustep and treadmill. Karelly has progressed workloads on both pieces of equipment. She averages 3.1 METs at level 4 on the Nustep and 4.74  METs at 3.0 mph @ 3.5% on the treadmill. Yessika is motivated to exercise. She performs the warmup and cooldown standing up. Will continue to monitor and progress as able.      Expected Outcomes Through exercise at rehab and home, the pt will decrease SOB with daily activities and feel confident in carrying out an exercise regimen at home. Through exercise at rehab and home, the pt will decrease SOB with daily activities and feel confident in carrying out an exercise regimen at home.               Nutrition & Weight - Outcomes:  Pre Biometrics - 02/08/21 0945       Pre Biometrics   Height 5\' 6"  (1.676 m)    Weight 48.7 kg    BMI (Calculated) 17.34    Grip Strength 21 kg  Nutrition:  Nutrition Therapy & Goals - 02/18/21 1321       Nutrition Therapy   Diet General Healthful      Personal Nutrition Goals   Nutrition Goal Pt to identify food quantities necessary to achieve weight gain of 0.5 lb per week at graduation from pulmonary rehab.    Personal Goal #2 Pt to build a healthy plate including vegetables, fruits, whole grains, and low-fat dairy products in a heart healthy meal plan.    Personal Goal #3 Pt to eat snacks/mini meals between meals such as 1 cup milk or 1 oz nuts      Intervention Plan   Intervention Prescribe, educate and counsel regarding individualized specific dietary modifications aiming towards targeted core components such as weight, hypertension, lipid management, diabetes, heart failure and other comorbidities.;Nutrition handout(s) given to patient.    Expected Outcomes Long Term Goal: Adherence to prescribed nutrition plan.;Short Term Goal: A plan has been developed with personal nutrition goals set during dietitian appointment.             Nutrition Discharge:   Education Questionnaire Score:  Knowledge Questionnaire Score - 02/08/21 1008       Knowledge Questionnaire Score   Pre Score 18/18             Goals reviewed  with patient; copy given to patient.

## 2021-04-02 NOTE — Patient Instructions (Addendum)
Plain T-spine demonstrated possibility of wedge deformity in thoracic spine.  MRI has been ordered.  Suspect you may have acute compression fracture from lifting heavy suitcase.  Have given small quantity of Norco 5/325 to take sparingly for pain.  MRI thoracic spine scheduled for Sunday, September 25.

## 2021-04-04 ENCOUNTER — Ambulatory Visit
Admission: RE | Admit: 2021-04-04 | Discharge: 2021-04-04 | Disposition: A | Discharge status: Home / Self Care | Payer: Medicare Other | Source: Ambulatory Visit | Attending: Internal Medicine | Admitting: Internal Medicine

## 2021-04-04 ENCOUNTER — Other Ambulatory Visit: Payer: Self-pay

## 2021-04-04 DIAGNOSIS — M546 Pain in thoracic spine: Secondary | ICD-10-CM

## 2021-04-04 DIAGNOSIS — M5124 Other intervertebral disc displacement, thoracic region: Secondary | ICD-10-CM | POA: Diagnosis not present

## 2021-04-04 DIAGNOSIS — R9389 Abnormal findings on diagnostic imaging of other specified body structures: Secondary | ICD-10-CM

## 2021-04-04 DIAGNOSIS — S22060A Wedge compression fracture of T7-T8 vertebra, initial encounter for closed fracture: Secondary | ICD-10-CM | POA: Diagnosis not present

## 2021-04-04 DIAGNOSIS — Z853 Personal history of malignant neoplasm of breast: Secondary | ICD-10-CM | POA: Diagnosis not present

## 2021-04-04 DIAGNOSIS — D1800 Hemangioma unspecified site: Secondary | ICD-10-CM | POA: Diagnosis not present

## 2021-04-04 MED ORDER — GADOBENATE DIMEGLUMINE 529 MG/ML IV SOLN
10.0000 mL | Freq: Once | INTRAVENOUS | Status: AC | PRN
  Administered 2021-04-04: 10 mL via INTRAVENOUS

## 2021-04-05 ENCOUNTER — Encounter (HOSPITAL_COMMUNITY): Payer: Self-pay

## 2021-04-05 ENCOUNTER — Ambulatory Visit (HOSPITAL_COMMUNITY)
Admission: RE | Admit: 2021-04-05 | Discharge: 2021-04-05 | Disposition: A | Discharge status: Home / Self Care | Payer: Medicare Other | Source: Ambulatory Visit | Attending: Emergency Medicine | Admitting: Emergency Medicine

## 2021-04-05 DIAGNOSIS — Z85118 Personal history of other malignant neoplasm of bronchus and lung: Secondary | ICD-10-CM | POA: Diagnosis not present

## 2021-04-05 DIAGNOSIS — I2699 Other pulmonary embolism without acute cor pulmonale: Secondary | ICD-10-CM | POA: Diagnosis not present

## 2021-04-05 DIAGNOSIS — J479 Bronchiectasis, uncomplicated: Secondary | ICD-10-CM | POA: Diagnosis not present

## 2021-04-05 DIAGNOSIS — I2510 Atherosclerotic heart disease of native coronary artery without angina pectoris: Secondary | ICD-10-CM | POA: Diagnosis not present

## 2021-04-05 DIAGNOSIS — C3431 Malignant neoplasm of lower lobe, right bronchus or lung: Secondary | ICD-10-CM | POA: Insufficient documentation

## 2021-04-05 LAB — POCT I-STAT CREATININE: Creatinine, Ser: 0.6 mg/dL (ref 0.44–1.00)

## 2021-04-05 MED ORDER — IOHEXOL 350 MG/ML SOLN
80.0000 mL | Freq: Once | INTRAVENOUS | Status: AC | PRN
  Administered 2021-04-05: 75 mL via INTRAVENOUS

## 2021-04-05 NOTE — Telephone Encounter (Signed)
Anna Valentine was returning your call, she stated she is doing better. She is out of pain medication, however she wants to try and take Advil in its place and see if she can make it on that. She will call back if she can not. She will see you on Friday for her CPE.

## 2021-04-06 ENCOUNTER — Encounter (HOSPITAL_COMMUNITY): Payer: Medicare Other

## 2021-04-06 ENCOUNTER — Other Ambulatory Visit: Payer: Medicare Other | Admitting: Internal Medicine

## 2021-04-06 ENCOUNTER — Other Ambulatory Visit: Payer: Self-pay

## 2021-04-06 ENCOUNTER — Ambulatory Visit (HOSPITAL_COMMUNITY): Payer: Medicare Other

## 2021-04-06 DIAGNOSIS — D508 Other iron deficiency anemias: Secondary | ICD-10-CM

## 2021-04-06 DIAGNOSIS — Z130 Encounter for screening for diseases of the blood and blood-forming organs and certain disorders involving the immune mechanism: Secondary | ICD-10-CM | POA: Diagnosis not present

## 2021-04-06 DIAGNOSIS — I10 Essential (primary) hypertension: Secondary | ICD-10-CM | POA: Diagnosis not present

## 2021-04-06 DIAGNOSIS — Z13220 Encounter for screening for lipoid disorders: Secondary | ICD-10-CM

## 2021-04-06 DIAGNOSIS — Z0000 Encounter for general adult medical examination without abnormal findings: Secondary | ICD-10-CM | POA: Diagnosis not present

## 2021-04-06 DIAGNOSIS — Z1329 Encounter for screening for other suspected endocrine disorder: Secondary | ICD-10-CM

## 2021-04-06 NOTE — Progress Notes (Signed)
Lab only 

## 2021-04-07 DIAGNOSIS — Z23 Encounter for immunization: Secondary | ICD-10-CM | POA: Diagnosis not present

## 2021-04-07 LAB — LIPID PANEL
Cholesterol: 166 mg/dL (ref <200)
HDL: 60 mg/dL (ref >=50)
LDL Cholesterol (Calc): 85 mg/dL (calc)
Non-HDL Cholesterol (Calc): 106 mg/dL (calc) (ref <130)
Total CHOL/HDL Ratio: 2.8 (calc) (ref <5.0)
Triglycerides: 117 mg/dL (ref <150)

## 2021-04-07 LAB — COMPREHENSIVE METABOLIC PANEL
AG Ratio: 1.4 (calc) (ref 1.0–2.5)
ALT: 16 U/L (ref 6–29)
AST: 20 U/L (ref 10–35)
Albumin: 3.8 g/dL (ref 3.6–5.1)
Alkaline phosphatase (APISO): 63 U/L (ref 37–153)
BUN: 11 mg/dL (ref 7–25)
CO2: 25 mmol/L (ref 20–32)
Calcium: 9.3 mg/dL (ref 8.6–10.4)
Chloride: 106 mmol/L (ref 98–110)
Creat: 0.79 mg/dL (ref 0.50–1.05)
Globulin: 2.7 g/dL (calc) (ref 1.9–3.7)
Glucose, Bld: 84 mg/dL (ref 65–99)
Potassium: 5.1 mmol/L (ref 3.5–5.3)
Sodium: 139 mmol/L (ref 135–146)
Total Bilirubin: 0.3 mg/dL (ref 0.2–1.2)
Total Protein: 6.5 g/dL (ref 6.1–8.1)

## 2021-04-07 LAB — CBC WITH DIFFERENTIAL/PLATELET
Absolute Monocytes: 329 cells/uL (ref 200–950)
Basophils Absolute: 21 cells/uL (ref 0–200)
Basophils Relative: 0.6 %
Eosinophils Absolute: 102 cells/uL (ref 15–500)
Eosinophils Relative: 2.9 %
HCT: 34.6 % — ABNORMAL LOW (ref 35.0–45.0)
Hemoglobin: 11.2 g/dL — ABNORMAL LOW (ref 11.7–15.5)
Lymphs Abs: 571 cells/uL — ABNORMAL LOW (ref 850–3900)
MCH: 29.9 pg (ref 27.0–33.0)
MCHC: 32.4 g/dL (ref 32.0–36.0)
MCV: 92.3 fL (ref 80.0–100.0)
MPV: 10 fL (ref 7.5–12.5)
Monocytes Relative: 9.4 %
Neutro Abs: 2478 cells/uL (ref 1500–7800)
Neutrophils Relative %: 70.8 %
Platelets: 292 10*3/uL (ref 140–400)
RBC: 3.75 10*6/uL — ABNORMAL LOW (ref 3.80–5.10)
RDW: 13.7 % (ref 11.0–15.0)
Total Lymphocyte: 16.3 %
WBC: 3.5 10*3/uL — ABNORMAL LOW (ref 3.8–10.8)

## 2021-04-07 LAB — TSH: TSH: 2.72 mIU/L (ref 0.40–4.50)

## 2021-04-07 NOTE — Telephone Encounter (Signed)
Good afternoon, Please see patient comment and advise.  Thank you.

## 2021-04-07 NOTE — Telephone Encounter (Signed)
Please let her know that I reviewed her Ct chest. The chronic changes that we have been following are not significantly changed compared with her priors. I do want her to make an OV w me so we can discuss further and plan next steps. Thanks.

## 2021-04-08 ENCOUNTER — Ambulatory Visit (HOSPITAL_COMMUNITY): Payer: Medicare Other

## 2021-04-08 ENCOUNTER — Encounter (HOSPITAL_COMMUNITY): Payer: Medicare Other

## 2021-04-09 ENCOUNTER — Encounter: Payer: Medicare Other | Admitting: Internal Medicine

## 2021-04-09 ENCOUNTER — Encounter: Payer: Self-pay | Admitting: Emergency Medicine

## 2021-04-09 ENCOUNTER — Other Ambulatory Visit: Payer: Self-pay

## 2021-04-09 ENCOUNTER — Ambulatory Visit (INDEPENDENT_AMBULATORY_CARE_PROVIDER_SITE_OTHER): Payer: Medicare Other | Admitting: Emergency Medicine

## 2021-04-09 VITALS — BP 112/78 | HR 87 | Temp 97.8°F | Ht 66.0 in | Wt 111.8 lb

## 2021-04-09 DIAGNOSIS — C3431 Malignant neoplasm of lower lobe, right bronchus or lung: Secondary | ICD-10-CM | POA: Diagnosis not present

## 2021-04-09 DIAGNOSIS — J479 Bronchiectasis, uncomplicated: Secondary | ICD-10-CM | POA: Diagnosis not present

## 2021-04-09 DIAGNOSIS — R9389 Abnormal findings on diagnostic imaging of other specified body structures: Secondary | ICD-10-CM | POA: Diagnosis not present

## 2021-04-09 DIAGNOSIS — A310 Pulmonary mycobacterial infection: Secondary | ICD-10-CM

## 2021-04-09 NOTE — Progress Notes (Signed)
Subjective:    Patient ID: Anna Valentine, female    DOB: 01-19-1951, 70 y.o.   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  She has been on therapy for this since 07/2020.  She also has a history of bilateral breast cancer on tamoxifen, non-small cell lung cancer post right lower lobe lobectomy.  She continues to have exertional dyspnea, is using her albuterol about once daily. She is going to undergo colonoscopy EGD with Dr. Havery Moros for nausea (improving). She saw Dr. Megan Salon yesterday.  Overall she has had some clinical improvement although her dyspnea persists.  He was encouraged and willing to continue 3 drug therapy. Less nausea, better energy, less cough / sputum, better appetite, no sweats.   CT-PA done 10/02/2020 reviewed by me shows some thrombus in the residual stump of the right lower lobe pulmonary artery that appears to be clot in situ post her prior right lower lobe lobectomy.  There is no sign of any other pulmonary embolism.  There is now micronodular involvement in the lingula as well as in the left lower lobe consistent with atypical infection.   ROV 04/09/21 --70 year old woman who follows up today for history of right lower lobe non-small cell lung cancer, mycobacterial colonization and infection with significant right lung destruction and distortion.  She also has a history of breast cancer.  Her most recent imaging also showed some clot formation in the right lower lobe PA stump.  Probable asthma associated with her bronchiectasis.  She feels better overall - no fevers, no cough, gaining some wt. Still w some exertional dyspnea. Uses albuterol most mornings.   CT-PA scan of the chest performed on 04/05/2021 and reviewed by me shows persistence of the filling defect in the posterior aspect of the stump  of her pulmonary artery, slightly enlarged compared with 10/02/2020 but with no other evidence of pulmonary embolism and the rest of the vasculature.  No significant lymphadenopathy.  Continued scattered areas of bronchiectasis that her not significantly changed fibrocavitary disease.  There is some evidence of bronchiectasis in the lingula, also better  Contin abx Albuterol prn Cards > [place] for Ca2+ Ask TCTS about stump clot   Review of Systems  Constitutional:  Positive for activity change and fatigue. Negative for fever and unexpected weight change.  HENT: Negative.  Negative for congestion, dental problem, ear pain, nosebleeds, postnasal drip, rhinorrhea, sinus pressure, sneezing, sore throat and trouble swallowing.   Eyes: Negative.  Negative for redness and itching.  Respiratory: Negative.  Negative for cough, chest tightness, shortness of breath and wheezing.   Cardiovascular: Negative.  Negative for palpitations and leg swelling.  Gastrointestinal: Negative.  Negative for nausea and vomiting.  Genitourinary: Negative.  Negative for dysuria.  Musculoskeletal:  Positive for back pain. Negative for joint swelling.  Skin: Negative.  Negative for rash.  Neurological: Negative.  Negative for headaches.  Hematological: Negative.  Does not bruise/bleed easily.  Psychiatric/Behavioral: Negative.  Negative for dysphoric mood. The patient is not nervous/anxious.        Objective:   Physical Exam Vitals:   04/09/21 1109  BP: 112/78  Pulse: 87  Temp: 97.8 F (36.6 C)  TempSrc: Oral  SpO2: 96%  Weight: 111 lb 12.8 oz (50.7 kg)  Height: 5\' 6"  (1.676 m)   Gen: stronger, thin woman,  well-nourished, in no distress  ENT: No lesions,  mouth clear,  oropharynx clear, no postnasal drip  Neck: No JVD, no stridor  Lungs: No use of accessory muscles, coarse R lung with some inspiratory and expiratory rhonchi. L is clear  Cardiovascular: RRR, heart sounds normal, no murmur or gallops, no  peripheral edema  Musculoskeletal: No deformities, no cyanosis or clubbing  Neuro: alert, non focal  Skin: Warm, no lesions or rash     Assessment & Plan:  Malignant neoplasm of lower lobe of right lung (HCC) No evidence of recurrence on most recent CT chest, most recent FOB with biopsies.  We will continue to follow  Bronchiectasis without complication ([place]) Stable to improved on imaging, CT from 04/05/2021.  She is being treated for mycobacterial disease.  Mycobacterium avium infection [facility]) Following with Dr. Megan Salon, remains on antibiotic regimen.  Her CT chest is improving.  We will need to decide going forward whether to repeat sputum cultures, repeat bronchoscopy for culture data.  I will coordinate this as well as repeat imaging with Dr. Megan Salon.  Abnormal CT of the chest She has clot in her right lower lobe PA stump of unclear significance.  It has extended proximally some on the most recent CT scan.  Unclear whether there is a role for anticoagulation in this setting although I suppose it could allow the clot to organize and prevent future propagation.  She does not have any evidence of DVT or PE elsewhere.  Could consider treating her with 3 to 6 months of anticoagulation.  I will review with thoracic surgery since they may occasionally see similar thrombus and postop patients after resection.  Baltazar Apo, MD, PhD 04/09/2021, 5:17 PM [facility] [phone] or if no answer [phone]

## 2021-04-09 NOTE — Patient Instructions (Addendum)
We reviewed the CT scan of the chest today. Please continue your antibiotic regimen as directed by Dr. Megan Salon Keep your albuterol available to use 2 puffs when you need if shortness of breath, chest tightness, wheezing. We discussed possibly starting anticoagulation medication to treat your blood clot that is been found in the right lower lobe pulmonary artery.  Dr. Lamonte Sakai will discuss this with thoracic surgery and then we will revisit. We will refer you back to see Dr. Aundra Dubin to discuss whether there may be any role for cardiac or aortic testing given the atherosclerosis seen on your CT scans of the chest. Follow with Dr Lamonte Sakai in 3 months or sooner if you have any problems.

## 2021-04-09 NOTE — Assessment & Plan Note (Signed)
Stable to improved on imaging, CT from 04/05/2021.  She is being treated for mycobacterial disease.

## 2021-04-09 NOTE — Assessment & Plan Note (Signed)
Following with Dr. Megan Salon, remains on antibiotic regimen.  Her CT chest is improving.  We will need to decide going forward whether to repeat sputum cultures, repeat bronchoscopy for culture data.  I will coordinate this as well as repeat imaging with Dr. Megan Salon.

## 2021-04-09 NOTE — Assessment & Plan Note (Signed)
She has clot in her right lower lobe PA stump of unclear significance.  It has extended proximally some on the most recent CT scan.  Unclear whether there is a role for anticoagulation in this setting although I suppose it could allow the clot to organize and prevent future propagation.  She does not have any evidence of DVT or PE elsewhere.  Could consider treating her with 3 to 6 months of anticoagulation.  I will review with thoracic surgery since they may occasionally see similar thrombus and postop patients after resection.

## 2021-04-09 NOTE — Assessment & Plan Note (Signed)
No evidence of recurrence on most recent CT chest, most recent FOB with biopsies.  We will continue to follow

## 2021-04-13 ENCOUNTER — Encounter (HOSPITAL_COMMUNITY): Payer: Medicare Other

## 2021-04-13 ENCOUNTER — Ambulatory Visit (HOSPITAL_COMMUNITY): Payer: Medicare Other

## 2021-04-15 ENCOUNTER — Encounter (HOSPITAL_COMMUNITY): Payer: Medicare Other

## 2021-04-15 ENCOUNTER — Ambulatory Visit (HOSPITAL_COMMUNITY): Payer: Medicare Other

## 2021-04-15 ENCOUNTER — Encounter: Payer: Self-pay | Admitting: Internal Medicine

## 2021-04-15 ENCOUNTER — Ambulatory Visit (INDEPENDENT_AMBULATORY_CARE_PROVIDER_SITE_OTHER): Payer: Medicare Other | Admitting: Internal Medicine

## 2021-04-15 ENCOUNTER — Other Ambulatory Visit: Payer: Self-pay

## 2021-04-15 DIAGNOSIS — A310 Pulmonary mycobacterial infection: Secondary | ICD-10-CM | POA: Diagnosis not present

## 2021-04-15 NOTE — Progress Notes (Signed)
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   Hearing loss 11/04/2020    Priority: 2.   Intractable nausea and vomiting 02/20/2020    Priority: 2.   Weight loss, non-intentional 09/20/2019    Priority: 2.   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   Nuclear sclerotic cataract of right eye 01/30/2020   Nuclear sclerotic cataract of left eye 01/30/2020   Iron deficiency anemia 01/20/2020   Malignant neoplasm of overlapping sites of right breast in female, estrogen receptor positive ([place]) 09/20/2019   Postprocedural pneumothorax    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   Port-A-Cath in place 04/26/2018   Genetic testing 02/20/2018   Family history of breast cancer    Family history of lung cancer    Personal history of lung cancer    Chest pain 11/28/2017   Abnormal CT of the chest 11/30/2016   Malignant neoplasm of upper-inner quadrant of left breast in female, estrogen receptor positive ([place]) 06/16/2014   Osteopenia 09/13/2013   Postmenopausal atrophic vaginitis 09/13/2013   Vitamin D deficiency 09/13/2013   Malignant neoplasm of lower lobe of right lung ([place]) 07/24/2012   Anxiety 03/11/2011   History of neutropenia 03/11/2011   Family history of colon cancer 03/11/2011   Bronchiectasis without complication ([place]) 22/29/7989    Patient's Medications  New Prescriptions   No medications on file  Previous Medications   ACYCLOVIR (ZOVIRAX) 400 MG TABLET    TAKE 1 TABLET BY MOUTH TWICE DAILY   ALBUTEROL (VENTOLIN HFA) 108 (90 BASE) MCG/ACT INHALER    INHALE 2 PUFFS INTO LUNGS  EVERY 6 HOURS AS NEEDED FOR WHEEZING OR SHORTNESS OF BREATH.   AZITHROMYCIN (ZITHROMAX) 500 MG TABLET    Take 500 mg by mouth daily.   CLOFAZIMINE POWD    100 mg by Does not apply route every morning.   ETHAMBUTOL (MYAMBUTOL) 400 MG TABLET    Take 2 tablets (800 mg total) by mouth daily.   FLUTICASONE (FLONASE) 50 MCG/ACT NASAL SPRAY       HYDROCODONE-ACETAMINOPHEN (NORCO) 5-325 MG TABLET    One tab with food every 8 hours as needed for severe thoracic back pain   MULTIPLE VITAMINS-MINERALS (ONE-A-DAY WOMENS PO)    Take by mouth.   PROMETHAZINE (PHENERGAN) 12.5 MG TABLET    Take 1 tablet (12.5 mg total) by mouth every 8 (eight) hours as needed.   TAMOXIFEN (NOLVADEX) 20 MG TABLET    Take 1 tablet (20 mg total) by mouth at bedtime.  Modified Medications   No medications on file  Discontinued Medications   No medications on file    Subjective: Mykaylah is in for her routine follow-up visit.  She started on azithromycin, clofazimine and ethambutol in January for her smoldering Mycobacterium avium pneumonia.  She had not tolerated a rifampin based regimen last year.  She feels like she is tolerating her medications well now.  She has been having some intermittent diarrhea but that seems to have improved spontaneously.  Her cough has improved and she now has only occasional cough and sputum production.  She has had no change  in her chronic dyspnea on exertion.  Her appetite is good and she has been able to gain some weight.  She recently took a road trip to [place] to visit her sister.  She developed some back pain while loading her suitcase into the car.  An MRI revealed that she had a subacute T7 compression fracture.  Fortunately and did not require any treatment and her pain has resolved.  She underwent repeat chest CT scan on 04/05/2021.  It revealed:    IMPRESSION: 1. Persistent and slightly enlarged thrombus in the right interlobar pulmonary artery at the stump from prior right lower  lobectomy. No other new pulmonary embolus identified. 2. Widespread areas of bronchiectasis and chronic mucoid impaction, similar to the prior study. 3. Fibro cavitary changes in the apex of the right upper lobe, similar to the prior examination. 4. Aortic atherosclerosis, in addition to left main and 2 vessel coronary artery disease. Please note that although the presence of coronary artery calcium documents the presence of coronary artery disease, the severity of this disease and any potential stenosis cannot be assessed on this non-gated CT examination. Assessment for potential risk factor modification, dietary therapy or pharmacologic therapy may be warranted, if clinically indicated.   Aortic Atherosclerosis (ICD10-I70.0).  Review of Systems: Review of Systems  Constitutional:  Negative for chills, diaphoresis, fever and weight loss.  Respiratory:  Positive for cough, sputum production and shortness of breath. Negative for hemoptysis and wheezing.   Cardiovascular:  Negative for chest pain.  Gastrointestinal:  Negative for abdominal pain, diarrhea, nausea and vomiting.  Musculoskeletal:  Negative for back pain.   Past Medical History:  Diagnosis Date   Anemia    hx of IDA   BRCA negative 2011   BRCA I/ II negative   Breast cancer ([place]) 08/2009   LEFT=stage 2, rx with lumpectomy and xrt   Breast cancer ([place]) 2019   RIGHT   Bronchiectasis ([place])    Chronic diastolic CHF (congestive heart failure) ([place]) 02/20/2020   Constipation    Essential hypertension 02/20/2020   Family history of adverse reaction to anesthesia    My Sister has nausea   Family history of breast cancer    Family history of colon cancer    Family history of lung cancer    GERD (gastroesophageal reflux disease)    not presently having symptoms   Hypertension    Iatrogenic pneumothorax 02/06/2019   Lung cancer ([place]) 06/06/05   stage 1 poorly differentiated adenocarcinoma, s/p right lower lobectomy.    Mycobacterium avium complex ([place])    dx 2021   Osteopenia 09/13/2013   Osteoporosis    Personal history of lung cancer    Pneumonia    2007ish, walking pnemonia - 2009 ish   STD (sexually transmitted disease)    HSV   Vitamin D deficiency 09/13/2013    Social History   Tobacco Use   Smoking status: Former    Packs/day: 2.00    Years: 18.00    Pack years: 36.00    Types: Cigarettes    Quit date: 07/12/1987    Years since quitting: 33.7   Smokeless tobacco: Never  Vaping Use   Vaping Use: Never used  Substance Use Topics   Alcohol use: Yes    Comment: 1 time per month   Drug use: No    Family History  Problem Relation Age of Onset   Allergies Mother    Asthma Mother    Lung cancer Mother  Breast cancer Mother 21       recurrence age 37   Colon cancer Father 43   Colon polyps Father 66   Prostate cancer Brother 9   Breast cancer Sister 66       Recurrence age 58 BRCA negative   Colon polyps Sister    Leukemia Sister    Breast cancer Sister 24   Prostate cancer Brother 24   Breast cancer Maternal Grandmother 60   Colon cancer Maternal Aunt    Leukemia Maternal Grandfather    Lung cancer Maternal Aunt    Breast cancer Cousin    Esophageal cancer Neg Hx    Rectal cancer Neg Hx    Stomach cancer Neg Hx     Allergies  Allergen Reactions   Rifampin Nausea And Vomiting   Taxol [Paclitaxel] Anaphylaxis   Paclitaxel (Protein-Bound) Hives   Tape Itching   Abraxane [Paclitaxel Protein-Bound Part] Rash   Clarithromycin Rash    Objective: Vitals:   04/15/21 1107  BP: 117/76  Pulse: 81  Temp: 97.6 F (36.4 C)  TempSrc: Oral  SpO2: 100%  Weight: 112 lb (50.8 kg)   Body mass index is 18.08 kg/m.  Physical Exam Constitutional:      Comments: She is in good spirits.  She certainly seems to be feeling much better since starting antibiotic therapy earlier this year.  She has gained 17 pounds since January.  Cardiovascular:     Rate and Rhythm: Normal rate  and regular rhythm.     Heart sounds: No murmur heard. Pulmonary:     Effort: Pulmonary effort is normal.     Breath sounds: No wheezing, rhonchi or rales.  Psychiatric:        Mood and Affect: Mood normal.    Lab Results Sputum AFB smear 03/09/2021: Negative Sputum AFB culture 03/09/2021: Mycobacterium avium  Problem List Items Addressed This Visit       1.   Mycobacterium avium infection ([place])    Her repeat sputum culture at the end of August continues to grow Mycobacterium avium.  This probably represents persistent colonization as she has had definite clinical improvement since starting her current 3 drug regimen 9 months ago.  Her recent CT scan does not reveal any changes suggestive of progressive pneumonia.  She is in agreement with continuing her current 3 drug antibiotic regimen for now.  We will monitor serial sputum cultures.  She will follow-up in 2 months.      Relevant Orders   MYCOBACTERIA, CULTURE, WITH FLUOROCHROME SMEAR     Michel Bickers, MD [facility] for [facility] [phone] pager   [phone] cell 04/15/2021, 11:39 AM

## 2021-04-15 NOTE — Assessment & Plan Note (Signed)
Her repeat sputum culture at the end of August continues to grow Mycobacterium avium.  This probably represents persistent colonization as she has had definite clinical improvement since starting her current 3 drug regimen 9 months ago.  Her recent CT scan does not reveal any changes suggestive of progressive pneumonia.  She is in agreement with continuing her current 3 drug antibiotic regimen for now.  We will monitor serial sputum cultures.  She will follow-up in 2 months.

## 2021-04-17 LAB — MYCOBACTERIA,CULT W/FLUOROCHROME SMEAR
MICRO NUMBER:: 12313702
SMEAR:: NONE SEEN
SPECIMEN QUALITY:: ADEQUATE

## 2021-04-18 NOTE — Progress Notes (Addendum)
[place]  [phone] [phone]    ID: Anna Valentine DOB: [birth year]  MR#: [id number]  GDJ#:[id number]  Patient Care Team: [name], MD as PCP - General (Internal Medicine) Kristan Votta, Virgie Dad, MD as Consulting Physician (Oncology) Collene Gobble, MD as Consulting Physician (Pulmonary Disease) [name], MD as Referring Physician Armbruster, Carlota Raspberry, MD as Consulting Physician (Gastroenterology) Kem Boroughs, [place] (Family Medicine) [name], MD as Consulting Physician (Radiation Oncology) Rolm Bookbinder, MD as Consulting Physician (General Surgery) Larey Dresser, MD as Consulting Physician (Cardiology) Haroldine Laws Shaune Pascal, MD as Consulting Physician (Cardiology)   CHIEF COMPLAINT:  (1) Estrogen receptor positive left-sided breast cancer (2011)    (2) history of stage IA right lung adenocarcinoma resected November 2006    (3) triple- positive right-sided breast cancer Sept 2019 (s/p bilateral mastectomies)    (4) homozygous p53 mutation/ Li-Fraumeni syndrome   CURRENT TREATMENT: Tamoxifen; antibiotic treatment for MAI   INTERVAL HISTORY: Anna Valentine returns today for follow-up of her triple positive breast cancer and history of homozygous p53 mutation.  Her most recent chest CT was on 04/05/2021 and showed: persistent and slightly enlarged thrombus in right interlobar pulmonary artery at stump from prior right lower lobectomy (initially noted 10/02/2020), no other new pulmonary embolus identified; Dr. Lamonte Sakai in his 04/09/2021 note discusses the possibility of anticoagulation for 3 to 6 months and plans to review with thoracic surgery.    Also on the recent chest CT scan there were widespread areas of bronchiectasis and chronic mucoid impaction, similar to prior; fibro cavitary changes in apex of right upper lobe, similar to prior.  She is followed by Dr. Megan Salon for Mycobacterium avium pneumonia. In January she was started  on triple antibiotics.  She had problems with rifampin and is currently on clofazimine, ethambutol and Zithromax  Dr. Megan Salon note the patient has had a good clinical response to antibiotic treatment with weight gain and improved sense of wellbeing.  He feels repeat sputum culture at the end of August, which was positive, likely represents persistent colonization.  Of note, she also underwent thoracic spine MRI on 04/04/2021 to evaluate for possible compression fraction after lifting injury.  This showed: late subacute compression fracture involving T7 vertebral body with up to 60% anterior height loss without bony retropulsion, felt to be older than a fracture related to patient's recent injury but new since chest CT 10/02/2020, benign/mechanical in appearance with no evidence for metastatic disease; small disc protrusions at T6-7 through T8-9 without significant stenosis.   REVIEW  OF SYSTEMS: Anna Valentine confirms that she feels much closer to her normal since starting the antibiotics for MAI.  She has gained weight, has occasional diarrhea as the main side effect, and has not had any visual changes so far.  She is followed closely by Dr. Herbert Deaner and [place] as far as her visual issues is concerned.  A detailed review of systems was otherwise stable.   COVID 19 VACCINATION STATUS: [place] x2, most recently 09/2019   LUNG CANCER HISTORY:  From Dr. [name] original intake note 07/26/2005: (Lung cancer presentation)  "The patient is a very pleasant 70 year old female without a significant past medical history, who states that in 04/2005 she felt a lump in the left neck.  It did not resolve spontaneously, so she was seen by Dr. [name] [facility], and a CT scan of the neck was performed on 04/25/05.  There was noted to be two lymph nodes on the left, and they  were normal in size, but they were asymmetrical.  No other pathologic findings were noted, except for left internal jugular vein, which was  atypically positioned.  However, because of these enlarged lymph nodes, a CT scan of the chest was also obtained by Dr. Renold Genta on 04/27/05, and the CT scan did reveal a poorly defined nodular opacity medially in the right upper lobe, measuring 6.5 mg anterior to posterior.  Within the right lower lobe there was noted to be a lobular nodular mass measuring 13 x 13 mm, and was felt to be worrisome for a primary lung carcinoma.  No other nodules or effusions were seen on the left lung.  On the mediastinal window images there was slight prominence of right hilar nodes, but no other evidence of mediastinal or hilar adenopathy.  A PET scan was thereafter obtained on 05/06/05.  The PET scan did reveal increased FDG activity within a small mass in the posterior superior right lower lobe, highly suspicious for a malignancy.  No significant nodal FDG uptake was identified within the hilar regions or the mediastinum.  Also noted was abnormal FDG uptake in the right middle lobe, corresponding to the CT scan findings, and again suspicious for a malignancy.  There was no evidence of metastatic disease within the neck, abdomen or pelvis.  The patient was then referred to [facility], and was seen by Dr. [name].  The patient underwent a right lower lobe wedge resection with completion lobectomy and mediastinal lymph node biopsies.  The pathology revealed the following:  Right upper lobe wedge resection revealed pulmonary tissue with necrotizing granulomatous inflammation.  No evidence of malignancy.  Right middle lobe wedge revealed again pulmonary tissue with necrotizing granulomatous inflammation.  No evidence of malignancy.  The right pleural lobe (lobectomy) revealed an adenocarcinoma, poorly differentiated, grade 3, measuring 1.2 cm.  The visceral pleura was negative.  Chest wall negative.  Mediastinum negative.  All margins were negative, including the pleural and parenchymal margins.  There was no evidence of lymphovascular  invasion.  In all lymph nodes sampled, including level 11, level 7, level 12, 4R and 2R lymph nodes negative for malignancy.  The patient was staged as T1 N0 M0, stage IA adenocarcinoma of the right lung.  Postoperatively the patient's course was complicated by the development of pleural effusion, requiring diuresis.  She had multiple x-rays performed.  Last x-ray performed on 07/07/05 revealed persistent right pleural effusion.  It was recommended that the patient have followups at [facility].  Dr. [name] Baxley kindly refers the patient to me today for medical oncology evaluation.  Clinically, the patient states that she has recovered well.  She is once again walking.  She was a runner, and she is trying to get back in shape."  LEFT BREAST CANCER HISTORY:From [name]'s 09/30/2009 note:  "She tells me that most recently she had her yearly screening mammogram performed that revealed an abnormality.  She also on exam was noted to have a palpable lump in the upper left breast as well.  Because of the abnormality, patient on September 02, 2009 had a digital diagnostic mammogram of the left breast and ultrasound of the left breast.  The mammogram revealed a 7.0 mm area slightly increased density in the upper left breast.  There were no suspicious calcifications noted.  The breast was heterogeneously dense.  Patient then went onto have an ultrasound of the breast performed and again a 7.0 mm irregular hypoechoic shadowing mass was noted in the  12 o'clock position of the left breast 5.0 cm from the nipple.  There were no enlarged or abnormal left axillary lymph nodes identified.  Patient went onto have a core biopsy performed on 09/02/2009 [phone]).  The needle core biopsy of the 12 o'clock mass revealed an invasive mammary carcinoma, low to intermediate grade.  It was lobular carcinoma.  Confirmatory immunohistochemical stains revealed the tumor to be strongly positive for cytokeratin AE1-AE3 with  an infiltrative pattern of growth and the tumor was negative for E-cadherin stain confirming a lobular phenotype.  The tumor was estrogen receptor positive at 98%, progesterone receptor positive 6%, proliferation marker Ki-67 by MIB was 14%.  The tumor did not express HER-2/neu by CISH with a signal of 1.04.  Patient was seen by Dr. Neldon Mc on 09/08/2009 for discussion of surgical options.  His recommendation was a lumpectomy.  Patient also had bilateral MRI of the breasts performed, which revealed a 0.8 cm mildly enlarged area of mass-like enhancement at the 12 o'clock location corresponding to the biopsy-proven breast cancer.  There was no evidence of lymphadenopathy."   PAST MEDICAL HISTORY: Past Medical History:  Diagnosis Date   Anemia    hx of IDA   BRCA negative 2011   BRCA I/ II negative   Breast cancer ([place]) 08/2009   LEFT=stage 2, rx with lumpectomy and xrt   Breast cancer ([place]) 2019   RIGHT   Bronchiectasis ([place])    Chronic diastolic CHF (congestive heart failure) ([place]) 02/20/2020   Constipation    Essential hypertension 02/20/2020   Family history of adverse reaction to anesthesia    My Sister has nausea   Family history of breast cancer    Family history of colon cancer    Family history of lung cancer    GERD (gastroesophageal reflux disease)    not presently having symptoms   Hypertension    Iatrogenic pneumothorax 02/06/2019   Lung cancer ([place]) 06/06/05   stage 1 poorly differentiated adenocarcinoma, s/p right lower lobectomy.   Mycobacterium avium complex ([place])    dx 2021   Osteopenia 09/13/2013   Osteoporosis    Personal history of lung cancer    Pneumonia    2007ish, walking pnemonia - 2009 ish   STD (sexually transmitted disease)    HSV   Vitamin D deficiency 09/13/2013    PAST SURGICAL HISTORY: Past Surgical History:  Procedure Laterality Date   BREAST BIOPSY     BREAST LUMPECTOMY  10/12/2009   Left lumpectomy and radiation, stage II, ER/PR+, Her 2  nu negative   BUNIONECTOMY Bilateral    COLONOSCOPY  2018   SA-MAC-suprep-no polyps   COLONOSCOPY     LOBECTOMY Right 06/06/2005   [place]   MASTECTOMY W/ SENTINEL NODE BIOPSY Bilateral 03/15/2018   MASTECTOMY W/ SENTINEL NODE BIOPSY Bilateral 03/15/2018   Procedure: BILATERAL TOTAL MASTECTOMIES WITH RIGHT SENTINEL LYMPH NODE BIOPSY;  Surgeon: Rolm Bookbinder, MD;  Location: [place];  Service: General;  Laterality: Bilateral;   PORTACATH PLACEMENT N/A 03/15/2018   Procedure: INSERTION PORT-A-CATH WITH [place];  Surgeon: Rolm Bookbinder, MD;  Location: [place];  Service: General;  Laterality: N/A;   [place]  02/06/2019   Flexible video fiberoptic bronchoscopy with electromagnetic navigation and biopsies.   VIDEO BRONCHOSCOPY WITH ENDOBRONCHIAL NAVIGATION Left 02/06/2019   Procedure: VIDEO BRONCHOSCOPY WITH ENDOBRONCHIAL NAVIGATION, left lung;  Surgeon: Collene Gobble, MD;  Location: [facility];  Service: Thoracic;  Laterality: Left;   VIDEO BRONCHOSCOPY WITH ENDOBRONCHIAL NAVIGATION N/A 01/08/2020   Procedure: VIDEO BRONCHOSCOPY WITH ENDOBRONCHIAL NAVIGATION;  Surgeon: Collene Gobble, MD;  Location: [facility];  Service: Thoracic;  Laterality: N/A;    FAMILY HISTORY Family History  Problem Relation Age of Onset   Allergies Mother    Asthma Mother    Lung cancer Mother    Breast cancer Mother 77       recurrence age 72   Colon cancer Father 43   Colon polyps Father 17   Prostate cancer Brother 109   Breast cancer Sister 50       Recurrence age 59 BRCA negative   Colon polyps Sister    Leukemia Sister    Breast cancer Sister 15   Prostate cancer Brother 50   Breast cancer Maternal Grandmother 6   Colon cancer Maternal Aunt    Leukemia Maternal Grandfather    Lung cancer Maternal Aunt    Breast cancer Cousin    Esophageal cancer Neg Hx    Rectal cancer Neg Hx    Stomach cancer Neg Hx   The patient's maternal grandmother was diagnosed with  cancer at age 62. Patient's mother was diagnosed at age 54. The patient's sister was diagnosed at age 3 with recurrence at 21 and is BRCA negative; she was also diagnosed with leukemia and underwent a bone marrow transplant before passing in 11/2018. A second sister was diagnosed at age 51.   GYNECOLOGIC HISTORY:  No LMP recorded. Patient is postmenopausal. Menarche age 44, the patient is GX P0. She went through the change of life in approximately age 36. She did not take hormone replacement.   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 She was the primary caregiver for her husband, who has had a couple of small strokes and had myelodysplasia, until he passed in 02/2020. She lives alone now. She helps a friend out with her business. She walks her 2 year old lab. The patient is a [employer]    ADVANCED DIRECTIVES: In place   HEALTH MAINTENANCE: Social History   Tobacco Use   Smoking status: Former    Packs/day: 2.00    Years: 18.00    Pack years: 36.00    Types: Cigarettes    Quit date: 07/12/1987    Years since quitting: 33.7   Smokeless tobacco: Never  Vaping Use   Vaping Use: Never used  Substance Use Topics   Alcohol use: Yes    Comment: 1 time per month   Drug use: No    Allergies  Allergen Reactions   Rifampin Nausea And Vomiting   Taxol [Paclitaxel] Anaphylaxis   Paclitaxel (Protein-Bound) Hives   Tape Itching   Abraxane [Paclitaxel Protein-Bound Part] Rash   Clarithromycin Rash    Current Outpatient Medications  Medication Sig Dispense Refill   letrozole (FEMARA) 2.5 MG tablet Take 1 tablet (2.5 mg total) by mouth daily. 90 tablet 4   acyclovir (ZOVIRAX) 400 MG tablet TAKE 1 TABLET BY MOUTH TWICE DAILY 120 tablet 0   albuterol (VENTOLIN HFA) 108 (90 Base) MCG/ACT inhaler INHALE 2 PUFFS INTO LUNGS EVERY 6 HOURS AS NEEDED FOR WHEEZING OR SHORTNESS OF BREATH. 8.5 g PRN   azithromycin (ZITHROMAX) 500 MG  tablet Take 500 mg by mouth daily.     Clofazimine POWD 100 mg by Does not apply route every morning. 3000 g 11   ethambutol (MYAMBUTOL) 400 MG tablet Take 2 tablets (  800 mg total) by mouth daily. 60 tablet 11   fluticasone (FLONASE) 50 MCG/ACT nasal spray  (Patient not taking: No sig reported)     HYDROcodone-acetaminophen (NORCO) 5-325 MG tablet One tab with food every 8 hours as needed for severe thoracic back pain (Patient not taking: Reported on 04/15/2021) 15 tablet 0   Multiple Vitamins-Minerals (ONE-A-DAY WOMENS PO) Take by mouth.     promethazine (PHENERGAN) 12.5 MG tablet Take 1 tablet (12.5 mg total) by mouth every 8 (eight) hours as needed. (Patient not taking: No sig reported) 30 tablet 1   Current Facility-Administered Medications  Medication Dose Route Frequency Provider Last Rate Last Admin   0.9 %  sodium chloride infusion  500 mL Intravenous Once Armbruster, Carlota Raspberry, MD        OBJECTIVE:  white woman who appears stated age  9:   04/19/21 1131  BP: 117/71  Pulse: 76  Resp: 18  Temp: (!) 97.3 F (36.3 C)  SpO2: 99%      Body mass index is 17.72 kg/m.    ECOG FS:1 - Symptomatic but completely ambulatory Filed Weights   04/19/21 1131  Weight: 109 lb 12.8 oz (49.8 kg)    Sclerae unicteric, EOMs intact Wearing a mask No cervical or supraclavicular adenopathy Lungs no rales or rhonchi Heart regular rate and rhythm Abd soft, nontender, positive bowel sounds MSK no focal spinal tenderness, no upper extremity lymphedema Neuro: nonfocal, well oriented, appropriate affect Breasts: Status post bilateral mastectomies.  There is no evidence of chest wall recurrence.   LAB RESULTS:  CMP     Component Value Date/Time   NA 139 04/06/2021 0948   NA 139 12/22/2016 1141   K 5.1 04/06/2021 0948   K 4.2 12/22/2016 1141   CL 106 04/06/2021 0948   CL 103 11/22/2012 1025   CO2 25 04/06/2021 0948   CO2 28 12/22/2016 1141   GLUCOSE 84 04/06/2021 0948   GLUCOSE 92  12/22/2016 1141   GLUCOSE 94 11/22/2012 1025   BUN 11 04/06/2021 0948   BUN 13.2 12/22/2016 1141   CREATININE 0.79 04/06/2021 0948   CREATININE 0.7 12/22/2016 1141   CALCIUM 9.3 04/06/2021 0948   CALCIUM 9.7 12/22/2016 1141   PROT 6.5 04/06/2021 0948   PROT 7.0 12/22/2016 1141   ALBUMIN 3.1 (L) 09/22/2020 1309   ALBUMIN 3.8 12/22/2016 1141   AST 20 04/06/2021 0948   AST 34 09/22/2020 1309   AST 18 12/22/2016 1141   ALT 16 04/06/2021 0948   ALT 82 (H) 09/22/2020 1309   ALT 10 12/22/2016 1141   ALKPHOS 243 (H) 09/22/2020 1309   ALKPHOS 81 12/22/2016 1141   BILITOT 0.3 04/06/2021 0948   BILITOT 0.3 09/22/2020 1309   BILITOT 0.65 12/22/2016 1141   GFRNONAA >60 09/22/2020 1309   GFRNONAA 93 04/16/2020 1225   GFRAA 108 04/16/2020 1225    INo results found for: SPEP, UPEP  Lab Results  Component Value Date   WBC 3.5 (L) 04/06/2021   NEUTROABS 2,478 04/06/2021   HGB 11.2 (L) 04/06/2021   HCT 34.6 (L) 04/06/2021   MCV 92.3 04/06/2021   PLT 292 04/06/2021      Chemistry      Component Value Date/Time   NA 139 04/06/2021 0948   NA 139 12/22/2016 1141   K 5.1 04/06/2021 0948   K 4.2 12/22/2016 1141   CL 106 04/06/2021 0948   CL 103 11/22/2012 1025   CO2 25 04/06/2021 0948   CO2  28 12/22/2016 1141   BUN 11 04/06/2021 0948   BUN 13.2 12/22/2016 1141   CREATININE 0.79 04/06/2021 0948   CREATININE 0.7 12/22/2016 1141      Component Value Date/Time   CALCIUM 9.3 04/06/2021 0948   CALCIUM 9.7 12/22/2016 1141   ALKPHOS 243 (H) 09/22/2020 1309   ALKPHOS 81 12/22/2016 1141   AST 20 04/06/2021 0948   AST 34 09/22/2020 1309   AST 18 12/22/2016 1141   ALT 16 04/06/2021 0948   ALT 82 (H) 09/22/2020 1309   ALT 10 12/22/2016 1141   BILITOT 0.3 04/06/2021 0948   BILITOT 0.3 09/22/2020 1309   BILITOT 0.65 12/22/2016 1141       Lab Results  Component Value Date   LABCA2 18 09/30/2009    No components found for: [id number]  No results for input(s): INR in the last 168  hours.  Urinalysis    Component Value Date/Time   COLORURINE AMBER (A) 02/21/2020 0456   APPEARANCEUR CLEAR 02/21/2020 0456   LABSPEC 1.030 02/21/2020 0456   PHURINE 6.0 02/21/2020 0456   GLUCOSEU NEGATIVE 02/21/2020 0456   HGBUR NEGATIVE 02/21/2020 0456   BILIRUBINUR NEGATIVE 02/21/2020 0456   BILIRUBINUR NEG 12/17/2019 1654   KETONESUR 20 (A) 02/21/2020 0456   PROTEINUR NEGATIVE 02/21/2020 0456   UROBILINOGEN 0.2 12/17/2019 1654   NITRITE NEGATIVE 02/21/2020 0456   LEUKOCYTESUR NEGATIVE 02/21/2020 0456    STUDIES: DG Thoracic Spine W/Swimmers  Result Date: 03/22/2021 CLINICAL DATA:  Thoracic spine pain after lifting heavy luggage this weekend. EXAM: THORACIC SPINE - 3 VIEWS COMPARISON:  October 02, 2020. FINDINGS: Mild wedge compression deformity of midthoracic vertebral body is noted concerning for fracture of indeterminate age. No spondylolisthesis is noted. IMPRESSION: Interval development of mild wedge compression deformity of midthoracic vertebral body concerning for fracture of indeterminate age. MRI may be performed for further evaluation. Electronically Signed   By: Marijo Conception M.D.   On: 03/22/2021 14:49   CT Angio Chest W/Cm &/Or Wo Cm  Result Date: 04/05/2021 CLINICAL DATA:  70 year old female with prior history of pulmonary embolism. Prior history of right lower lobectomy for lung cancer. Follow-up study. EXAM: CT ANGIOGRAPHY CHEST WITH CONTRAST TECHNIQUE: Multidetector CT imaging of the chest was performed using the standard protocol during bolus administration of intravenous contrast. Multiplanar CT image reconstructions and MIPs were obtained to evaluate the vascular anatomy. CONTRAST:  81m OMNIPAQUE IOHEXOL 350 MG/ML SOLN COMPARISON:  Chest CT 10/02/2020. FINDINGS: Cardiovascular: In the posterior aspect of the stump of the interlobar pulmonary artery there is an elongated filling defect measuring 1.6 x 0.6 cm (axial image 37 of series 4) which has increased in size  compared to the prior study from 10/02/2020, compatible with propagation of indwelling thrombus. No other filling defects are noted elsewhere in the pulmonary arterial tree to suggest new pulmonary embolism. Chronic rightward shift of cardiomediastinal structures, similar to the prior study. Heart size is normal. There is no significant pericardial fluid, thickening or pericardial calcification. There is aortic atherosclerosis, as well as atherosclerosis of the great vessels of the mediastinum and the coronary arteries, including calcified atherosclerotic plaque in the left main, left anterior descending and right coronary arteries. Mediastinum/Nodes: No pathologically enlarged mediastinal or hilar lymph nodes. Esophagus is unremarkable in appearance. No axillary lymphadenopathy. Surgical clips in the right axillary region from prior lymph node dissection. Lungs/Pleura: Status post right lower lobectomy. Scattered areas of bronchiectasis are noted throughout the lungs bilaterally (right greater than left), similar to the  prior study. Fibro cavitary changes are also noted in the apex of the right hemithorax where there is a suture line, suggesting prior wedge resection. Patchy areas of nodularity are noted associated with regions of bronchiectasis, most evident in the inferior segment of the lingula and in the posterior aspect of the left lower lobe, favored to reflect areas of chronic mucoid impaction, with the largest of these nodules in the inferior segment of the lingula (axial image 84 of series 6) measuring 1.3 x 0.9 cm, similar to the prior examination. Trace volume of chronic right-sided pleural fluid. No left pleural effusion. Upper Abdomen: 2.7 cm low-attenuation lesion in the upper pole of the right kidney, compatible with a simple cyst. Musculoskeletal: Chronic compression fracture of T7 with 40% loss of anterior vertebral body height, similar to the prior study. Status post bilateral modified radical  mastectomy. There are no aggressive appearing lytic or blastic lesions noted in the visualized portions of the skeleton. Review of the MIP images confirms the above findings. IMPRESSION: 1. Persistent and slightly enlarged thrombus in the right interlobar pulmonary artery at the stump from prior right lower lobectomy. No other new pulmonary embolus identified. 2. Widespread areas of bronchiectasis and chronic mucoid impaction, similar to the prior study. 3. Fibro cavitary changes in the apex of the right upper lobe, similar to the prior examination. 4. Aortic atherosclerosis, in addition to left main and 2 vessel coronary artery disease. Please note that although the presence of coronary artery calcium documents the presence of coronary artery disease, the severity of this disease and any potential stenosis cannot be assessed on this non-gated CT examination. Assessment for potential risk factor modification, dietary therapy or pharmacologic therapy may be warranted, if clinically indicated. Aortic Atherosclerosis (ICD10-I70.0). Electronically Signed   By: Vinnie Langton M.D.   On: 04/05/2021 11:07   MR THORACIC SPINE W WO CONTRAST  Result Date: 04/04/2021 CLINICAL DATA:  Initial evaluation for compression fracture, lifting injury approximately 3 weeks ago, now with mid back pain. History of breast cancer. EXAM: MRI THORACIC WITHOUT AND WITH CONTRAST TECHNIQUE: Multiplanar and multiecho pulse sequences of the thoracic spine were obtained without and with intravenous contrast. CONTRAST:  92m MULTIHANCE GADOBENATE DIMEGLUMINE 529 MG/ML IV SOLN COMPARISON:  Previous radiograph from 03/22/2021 as well as most recent CT from 10/02/2020. FINDINGS: Alignment: Mild exaggeration of the normal thoracic kyphosis. No listhesis. Vertebrae: Compression deformity seen involving the T7 vertebral body with up to 60% anterior height loss without bony retropulsion. There is relatively mild residual marrow edema and enhancement  at the inferior aspect of T7, suggesting that this is late subacute in nature. No underlying lesion or other pathologic features evident by MRI. Otherwise, vertebral body height maintained with no other acute, subacute, or chronic fracture. Underlying bone marrow signal intensity within normal limits. 6 mm benign hemangioma noted within the T6 vertebral body. Additional 1 cm well-circumscribed enhancing lesion within the posterior aspect of T9 is seen, favored to reflect an atypical hemangioma (series 10, image 12). No other discrete or worrisome osseous lesions. No evidence for metastatic disease. No evidence for osteomyelitis discitis or septic arthritis. Cord:  Normal signal and morphology.  No abnormal enhancement. Paraspinal and other soft tissues: Paraspinous soft tissues demonstrate no acute finding. Postsurgical changes with associated volume loss, chronic pleuroparenchymal thickening/scarring, and bronchiectasis partially visualize within the right lung, better evaluated on prior CT. 1.9 cm T2 hyperintense simple cyst noted at the right kidney. Visualized visceral structures otherwise unremarkable. Disc levels: T6-7: Right  paracentral disc protrusion indents the right ventral thecal sac (series 7, image 15). Mild flattening of the right ventral cord without cord signal changes or significant spinal stenosis. Foramina remain patent. T7-8: Small left paracentral disc protrusion minimally indents the left ventral thecal sac (series 7, image 18). Minimal cord flattening without significant spinal stenosis. Foramina remain patent. T8-9: Small left paracentral disc protrusion indents the left ventral thecal sac (series 7, image 21). Mild cord flattening without cord signal changes or significant spinal stenosis. Foramina remain patent. T9-10: Minimal right eccentric disc bulge. Small perineural cyst noted at the left neural foramen. No stenosis. Otherwise, no other significant spondylosis seen within the thoracic  spine for age. No significant spinal stenosis. Foramina remain patent. IMPRESSION: 1. Late subacute compression fracture involving the T7 vertebral body with up to 60% anterior height loss without bony retropulsion. Although new as compared to prior CT from 10/02/2020, appearance of this fracture is felt to be older than a fracture related to patient's recent injury 3 weeks ago. This is benign/mechanical in appearance with no evidence for metastatic disease within the thoracic spine. 2. Small disc protrusions at T6-7 through T8-9 as above without significant stenosis. 3. Postsurgical changes with associated volume loss within the right lung, better evaluated on prior CT. Electronically Signed   By: Jeannine Boga M.D.   On: 04/04/2021 23:29     ASSESSMENT: 70 y.o. [place], [place] woman status post right upper lobe wedge resection, middle lobe wedge resection, lower lobectomy and mediastinal lymph node dissection 06/06/2005 for a 1.2 cm grade 3 adenocarcinoma, pT1 pN1, stage Ia  (a) followed at [facility] with every other year chest CT, most recently 06/06/2017  LEFT BREAST CANCER: (1) status post left breast upper outer quadrant biopsy 09/02/2009 for an invasive lobular carcinoma (E-cadherin negative) estrogen receptor 98% positive, progesterone receptor 6% positive, with an MIB-1 of 14% and no HER-2 amplification. [[id number] [id number]]  (2) status post left lumpectomy and sentinel lymph node sampling 10/12/2009 for a pT1b pN1, stage IIA invasive lobular breast cancer, with negative margins.  (3) Oncotype DX score of 16 predicts a risk of outside the breast recurrence of 10% if the patient's only systemic treatment is tamoxifen for 5 years. It also predicts no benefit from adjuvant chemotherapy  (4) completed adjuvant radiation therapy 01/28/2010, receiving 5040 cGy to the left breast, with a boost to the upper inner aspect of the breast (to a cumulative dose of 6300 cGy); the axillary and supraclavicular regions  received 4500 cGy  (5) started anastrozole July 2011, completedI 5 years July 2016  RIGHT BREAST CANCER: (6) status post right breast upper outer quadrant biopsy 01/29/2018 for a clinical  T1c N0, stage IA invasive ductal carcinoma, grade 2, estrogen receptor positive, progesterone receptor negative, HER-2 amplified, with an MIB-1 of 15%  (7) status post bilateral mastectomy with right sentinel lymph node sampling 03/15/2018, showing  (a) on the left, no malignancy noted  (b) on the right, a pT1c pN0,stage IA invasive ductal carcinoma, grade 2, with negative margins.  (c) a total of 6 lymph nodes removed from the right axilla, none from the left  (8) chemotherapy consisting of paclitaxel weekly x12 started 04/26/2018, with trastuzumab to be given for 1 year  (a) myocardial perfusion study 12/20/2017 showed an ejection fraction of 75% (hyperdynamic  (b) paclitaxel switched to Abraxane with the dose #2 because of initial reaction to Taxol  (c) Abraxane discontinued 07/13/2018 with continuing side effects  (d) received a total of 10 paclitaxel  last Abraxane doses of 12 planned  (9) adjuvant radiation not indicated  (10) tamoxifen started 08/01/2018, switched to anastrozole as of 04/19/2021 because of clotting concerns  (11) bone density 02/21/2012 at [facility] showed osteoporosis with a T score of -2.5; on alendronate  (12) repeat genetics testing 02/08/2018 offered through [facility] Common Hereditary Cancers Panel and 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 There were no deleterious mutations in: APC, ATM, AXIN2, BARD1, BMPR1A, BRCA1, BRCA2, BRIP1, CDH1, CDKN2A (p14ARF), CDKN2A (p16INK4a), CKD4, CHEK2, CTNNA1, DICER1, EPCAM (Deletion/duplication testing only), GREM1 (promoter region deletion/duplication testing only), KIT, MEN1, MLH1, MSH2, MSH3, MSH6, MUTYH, NBN, NF1, NHTL1, PALB2, PDGFRA, PMS2, POLD1, POLE, PTEN, RAD50, RAD51C, RAD51D, SDHB, SDHC, SDHD, SMAD4, SMARCA4.  STK11, TSC1, TSC2, and VHL.  The following genes were evaluated for sequence changes only: SDHA and HOXB13 c.251G>A variant only.  (a) see "cancer surveillance" for details of long term Maylon Peppers related screening studies   (i) s/p bilateral mastectomies   (ii) colonoscopy recommended every 2 to 5 years: [Study on 11/13/2020 showed a single angiodysplastic lesion; repeat colonoscopy scheduled in 5 years ([facility])]   (iii) brain MRI every year: however patient has significant claustrophobia   (iiii) considered referral to a Li-Fraumeni syndrome clinic--opted against for now.   PLAN: Anna Valentine is clearly gaining ground as the MAI comes under better control.  She is still short of breath and hopefully that will improve.  Hopefully she will continue to gain weight and her overall functional status will return closer to her normal.  Given the findings of some surgery associated clot on the 04/05/2021 CT angio of the chest I think it would be prudent to discontinue tamoxifen and switch to anastrozole.  She has a good understanding of the possible toxicities side effects and complications of that drug and I gave her some written information regarding it as well.  She will let me know if she has any side effects from it of concern.  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  She tells me she would have to be "completely out" at 2 undergo that test and at this point she is not inclined to undergo it.  Fortunately she has no symptoms suggestive of CNS disease.  I would feel more comfortable if she saw a Li-Fraumeni specialist at least once to help [place] organize follow-up in more detail.  We elected that she is going to check with her insurance and see if there is such a specialist on her list.  She will also ask about out-of-pocket costs.  I am going to check in with her again 04/26/2021 to follow-up on that.  Otherwise she will see [place] again in April 2023.  She knows  to call for any other issues that may develop before then.  Total encounter time 35 minutes.Sarajane Jews C. Stephie Xu, MD 04/19/21 5:31 PM Medical Oncology and Hematology [facility] [place], [place] [address] Tel. [phone]    Fax. [phone]    I, Wilburn Mylar, am acting as scribe for Dr. Virgie Dad. Zaylin Pistilli.  I, Lurline Del MD, have reviewed the above documentation for accuracy and completeness, and I agree with the above.   *Total Encounter Time as defined by the Centers for Medicare and Medicaid Services includes, in addition to the face-to-face time of a patient visit (documented in the note above) non-face-to-face time: obtaining and reviewing outside history, ordering and reviewing medications, tests or  procedures, care coordination (communications with other health care professionals or caregivers) and documentation in the medical record.

## 2021-04-19 ENCOUNTER — Inpatient Hospital Stay: Payer: Medicare Other

## 2021-04-19 ENCOUNTER — Inpatient Hospital Stay: Payer: Medicare Other | Attending: Oncology | Admitting: Oncology

## 2021-04-19 ENCOUNTER — Other Ambulatory Visit: Payer: Self-pay

## 2021-04-19 VITALS — BP 117/71 | HR 76 | Temp 97.3°F | Resp 18 | Ht 66.0 in | Wt 109.8 lb

## 2021-04-19 DIAGNOSIS — Z888 Allergy status to other drugs, medicaments and biological substances status: Secondary | ICD-10-CM | POA: Diagnosis not present

## 2021-04-19 DIAGNOSIS — Z836 Family history of other diseases of the respiratory system: Secondary | ICD-10-CM | POA: Insufficient documentation

## 2021-04-19 DIAGNOSIS — D509 Iron deficiency anemia, unspecified: Secondary | ICD-10-CM

## 2021-04-19 DIAGNOSIS — Z800 Family history of malignant neoplasm of digestive organs: Secondary | ICD-10-CM | POA: Insufficient documentation

## 2021-04-19 DIAGNOSIS — C3431 Malignant neoplasm of lower lobe, right bronchus or lung: Secondary | ICD-10-CM

## 2021-04-19 DIAGNOSIS — C50811 Malignant neoplasm of overlapping sites of right female breast: Secondary | ICD-10-CM | POA: Diagnosis not present

## 2021-04-19 DIAGNOSIS — G96191 Perineural cyst: Secondary | ICD-10-CM | POA: Diagnosis not present

## 2021-04-19 DIAGNOSIS — Z801 Family history of malignant neoplasm of trachea, bronchus and lung: Secondary | ICD-10-CM | POA: Insufficient documentation

## 2021-04-19 DIAGNOSIS — Z806 Family history of leukemia: Secondary | ICD-10-CM | POA: Diagnosis not present

## 2021-04-19 DIAGNOSIS — C50212 Malignant neoplasm of upper-inner quadrant of left female breast: Secondary | ICD-10-CM | POA: Diagnosis not present

## 2021-04-19 DIAGNOSIS — I10 Essential (primary) hypertension: Secondary | ICD-10-CM | POA: Insufficient documentation

## 2021-04-19 DIAGNOSIS — Z803 Family history of malignant neoplasm of breast: Secondary | ICD-10-CM | POA: Diagnosis not present

## 2021-04-19 DIAGNOSIS — I700 Atherosclerosis of aorta: Secondary | ICD-10-CM | POA: Diagnosis not present

## 2021-04-19 DIAGNOSIS — Z170 Estrogen receptor positive status [ER+]: Secondary | ICD-10-CM | POA: Insufficient documentation

## 2021-04-19 DIAGNOSIS — Z85118 Personal history of other malignant neoplasm of bronchus and lung: Secondary | ICD-10-CM | POA: Diagnosis not present

## 2021-04-19 DIAGNOSIS — Z8379 Family history of other diseases of the digestive system: Secondary | ICD-10-CM | POA: Diagnosis not present

## 2021-04-19 DIAGNOSIS — Z902 Acquired absence of lung [part of]: Secondary | ICD-10-CM | POA: Diagnosis not present

## 2021-04-19 DIAGNOSIS — M8580 Other specified disorders of bone density and structure, unspecified site: Secondary | ICD-10-CM

## 2021-04-19 DIAGNOSIS — M546 Pain in thoracic spine: Secondary | ICD-10-CM | POA: Diagnosis not present

## 2021-04-19 DIAGNOSIS — Z86711 Personal history of pulmonary embolism: Secondary | ICD-10-CM | POA: Diagnosis not present

## 2021-04-19 DIAGNOSIS — Z923 Personal history of irradiation: Secondary | ICD-10-CM | POA: Diagnosis not present

## 2021-04-19 DIAGNOSIS — C50412 Malignant neoplasm of upper-outer quadrant of left female breast: Secondary | ICD-10-CM | POA: Insufficient documentation

## 2021-04-19 DIAGNOSIS — Z79899 Other long term (current) drug therapy: Secondary | ICD-10-CM | POA: Insufficient documentation

## 2021-04-19 DIAGNOSIS — J479 Bronchiectasis, uncomplicated: Secondary | ICD-10-CM | POA: Diagnosis not present

## 2021-04-19 DIAGNOSIS — Z8371 Family history of colonic polyps: Secondary | ICD-10-CM | POA: Insufficient documentation

## 2021-04-19 DIAGNOSIS — Z79811 Long term (current) use of aromatase inhibitors: Secondary | ICD-10-CM | POA: Insufficient documentation

## 2021-04-19 DIAGNOSIS — Z9013 Acquired absence of bilateral breasts and nipples: Secondary | ICD-10-CM | POA: Insufficient documentation

## 2021-04-19 DIAGNOSIS — Z87891 Personal history of nicotine dependence: Secondary | ICD-10-CM | POA: Insufficient documentation

## 2021-04-19 DIAGNOSIS — Z8042 Family history of malignant neoplasm of prostate: Secondary | ICD-10-CM | POA: Insufficient documentation

## 2021-04-19 DIAGNOSIS — M5124 Other intervertebral disc displacement, thoracic region: Secondary | ICD-10-CM | POA: Diagnosis not present

## 2021-04-19 MED ORDER — LETROZOLE 2.5 MG PO TABS: 2.5000 mg | ORAL_TABLET | Freq: Every day | ORAL | 4 refills | Status: DC

## 2021-04-20 ENCOUNTER — Ambulatory Visit (HOSPITAL_COMMUNITY): Payer: Medicare Other

## 2021-04-22 ENCOUNTER — Ambulatory Visit (HOSPITAL_COMMUNITY): Payer: Medicare Other

## 2021-04-26 ENCOUNTER — Other Ambulatory Visit: Payer: Self-pay | Admitting: Oncology

## 2021-04-27 ENCOUNTER — Ambulatory Visit (HOSPITAL_COMMUNITY): Payer: Medicare Other

## 2021-04-28 ENCOUNTER — Other Ambulatory Visit: Payer: Self-pay | Admitting: Internal Medicine

## 2021-04-29 ENCOUNTER — Ambulatory Visit (HOSPITAL_COMMUNITY): Payer: Medicare Other

## 2021-04-29 ENCOUNTER — Telehealth (HOSPITAL_COMMUNITY): Payer: Self-pay | Admitting: Cardiology

## 2021-05-11 NOTE — Telephone Encounter (Signed)
Please advise on the treatment per patient request.

## 2021-05-13 ENCOUNTER — Telehealth (INDEPENDENT_AMBULATORY_CARE_PROVIDER_SITE_OTHER): Payer: Medicare Other | Admitting: Internal Medicine

## 2021-05-13 ENCOUNTER — Telehealth: Payer: Self-pay

## 2021-05-13 DIAGNOSIS — M545 Low back pain, unspecified: Secondary | ICD-10-CM | POA: Diagnosis not present

## 2021-05-13 DIAGNOSIS — Z85118 Personal history of other malignant neoplasm of bronchus and lung: Secondary | ICD-10-CM | POA: Diagnosis not present

## 2021-05-13 DIAGNOSIS — Z853 Personal history of malignant neoplasm of breast: Secondary | ICD-10-CM | POA: Diagnosis not present

## 2021-05-13 DIAGNOSIS — Z8619 Personal history of other infectious and parasitic diseases: Secondary | ICD-10-CM

## 2021-05-13 DIAGNOSIS — M810 Age-related osteoporosis without current pathological fracture: Secondary | ICD-10-CM

## 2021-05-13 MED ORDER — IBUPROFEN 800 MG PO TABS: 800.0000 mg | ORAL_TABLET | Freq: Three times a day (TID) | ORAL | 1 refills | Status: DC | PRN

## 2021-05-13 NOTE — Patient Instructions (Addendum)
Ibuprofen 800 mg 3 times a day as needed with food for musculoskeletal pain. Bone density ordered at [facility].  Avoid heavy lifting.  May use heating pad.  Call if not improving in 2 to 3 days or sooner if worse.  Bone density study ordered.

## 2021-05-13 NOTE — Telephone Encounter (Signed)
Patient called stating that you treated her for upper back pain which is improved, however, now her lower back is bothering her. The lung area upper left x 10 days. Thinks she hurt it messing with furniture. She is asking for a virtual.

## 2021-05-13 NOTE — Telephone Encounter (Signed)
scheduled

## 2021-05-13 NOTE — Telephone Encounter (Signed)
Will forward back to RB to make him aware of message.

## 2021-05-14 MED ORDER — APIXABAN 5 MG PO TABS: 5.0000 mg | ORAL_TABLET | Freq: Two times a day (BID) | ORAL | 2 refills | Status: DC

## 2021-05-20 NOTE — Telephone Encounter (Signed)
The patient reports that you wanted her to start on Eliquis 5 mg BID  and she was given Ibprofen 800 mg by her PCP on 05/13/21 and she wants to know when she needs to stop taking Ibuprofen to take the Eliquis. Please advise.

## 2021-05-26 ENCOUNTER — Ambulatory Visit: Payer: Medicare Other | Admitting: Internal Medicine

## 2021-05-26 ENCOUNTER — Ambulatory Visit (INDEPENDENT_AMBULATORY_CARE_PROVIDER_SITE_OTHER): Payer: Medicare Other

## 2021-05-26 DIAGNOSIS — Z0000 Encounter for general adult medical examination without abnormal findings: Secondary | ICD-10-CM | POA: Diagnosis not present

## 2021-05-26 NOTE — Progress Notes (Signed)
Subjective:   Anna Valentine is a 70 y.o. female who presents for Medicare Annual (Subsequent) preventive examination. I connected with  [name] on 05/26/21 by a phone call and verified that I am speaking with the correct person using two identifiers.  Location of provider: Office Location of patient: [place] I discussed the limitations of evaluation and management by telemedicine. The patient expressed understanding and agreed to proceed.   Review of Systems    Defer to PCP Cardiac Risk Factors include: advanced age (>65mn, >>56women);sedentary lifestyle     Objective:    There were no vitals filed for this visit. There is no height or weight on file to calculate BMI.  Advanced Directives 05/26/2021 02/08/2021 03/19/2020 02/20/2020 02/17/2020 01/08/2020 02/06/2019  Does Patient Have a Medical Advance Directive? Yes Yes Yes No No Yes -  Type of AParamedicof [place]Living will [place] will Living will - - [place] will -  Does patient want to make changes to medical advance directive? No - Patient declined No - Patient declined No - Patient declined - - - No - Patient declined  Copy of [place] Chart? No - copy requested - - - - No - copy requested -  Would patient like information on creating a medical advance directive? - - - No - Patient declined - - -    Current Medications (verified) Outpatient Encounter Medications as of 05/26/2021  Medication Sig   acyclovir (ZOVIRAX) 400 MG tablet TAKE 1 TABLET BY MOUTH TWICE DAILY   albuterol (VENTOLIN HFA) 108 (90 Base) MCG/ACT inhaler INHALE 2 PUFFS INTO LUNGS EVERY 6 HOURS AS NEEDED FOR WHEEZING OR SHORTNESS OF BREATH.   apixaban (ELIQUIS) 5 MG TABS tablet Take 1 tablet (5 mg total) by mouth 2 (two) times daily.   azithromycin (ZITHROMAX) 500 MG tablet Take 500 mg by mouth daily.   Clofazimine POWD 100 mg by Does not apply route every  morning.   ethambutol (MYAMBUTOL) 400 MG tablet Take 2 tablets (800 mg total) by mouth daily.   ibuprofen (ADVIL) 800 MG tablet Take 1 tablet (800 mg total) by mouth every 8 (eight) hours as needed.   letrozole (FEMARA) 2.5 MG tablet Take 2.5 mg by mouth daily.   Multiple Vitamins-Minerals (ONE-A-DAY WOMENS PO) Take by mouth.   promethazine (PHENERGAN) 12.5 MG tablet Take 1 tablet (12.5 mg total) by mouth every 8 (eight) hours as needed.   Facility-Administered Encounter Medications as of 05/26/2021  Medication   0.9 %  sodium chloride infusion    Allergies (verified) Rifampin, Taxol [paclitaxel], Paclitaxel (protein-bound), Tape, Abraxane [paclitaxel protein-bound part], and Clarithromycin   History: Past Medical History:  Diagnosis Date   Anemia    hx of IDA   BRCA negative 2011   BRCA I/ II negative   Breast cancer ([place] 08/2009   LEFT=stage 2, rx with lumpectomy and xrt   Breast cancer ([place] 2019   RIGHT   Bronchiectasis ([place]    Chronic diastolic CHF (congestive heart failure) ([facility] 02/20/2020   Constipation    Essential hypertension 02/20/2020   Family history of adverse reaction to anesthesia    My Sister has nausea   Family history of breast cancer    Family history of colon cancer    Family history of lung cancer    GERD (gastroesophageal reflux disease)    not presently having symptoms   Hypertension    Iatrogenic pneumothorax  02/06/2019   Lung cancer ([place]) 06/06/05   stage 1 poorly differentiated adenocarcinoma, s/p right lower lobectomy.   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   Osteopenia 09/13/2013   Osteoporosis    Personal history of lung cancer    Pneumonia    2007ish, walking pnemonia - 2009 ish   STD (sexually transmitted disease)    HSV   Vitamin D deficiency 09/13/2013   Past Surgical History:  Procedure Laterality Date   BREAST BIOPSY     BREAST LUMPECTOMY  10/12/2009   Left lumpectomy and radiation, stage II, ER/PR+, Her 2 nu negative    BUNIONECTOMY Bilateral    COLONOSCOPY  2018   SA-MAC-suprep-no polyps   COLONOSCOPY     LOBECTOMY Right 06/06/2005   [place]   MASTECTOMY W/ SENTINEL NODE BIOPSY Bilateral 03/15/2018   MASTECTOMY W/ SENTINEL NODE BIOPSY Bilateral 03/15/2018   Procedure: BILATERAL TOTAL MASTECTOMIES WITH RIGHT SENTINEL LYMPH NODE BIOPSY;  Surgeon: Rolm Bookbinder, MD;  Location: [facility];  Service: General;  Laterality: Bilateral;   PORTACATH PLACEMENT N/A 03/15/2018   Procedure: INSERTION PORT-A-CATH WITH [place];  Surgeon: Rolm Bookbinder, MD;  Location: [facility];  Service: General;  Laterality: N/A;   [place]  02/06/2019   Flexible video fiberoptic bronchoscopy with electromagnetic navigation and biopsies.   VIDEO BRONCHOSCOPY WITH ENDOBRONCHIAL NAVIGATION Left 02/06/2019   Procedure: VIDEO BRONCHOSCOPY WITH ENDOBRONCHIAL NAVIGATION, left lung;  Surgeon: Collene Gobble, MD;  Location: [facility];  Service: Thoracic;  Laterality: Left;   VIDEO BRONCHOSCOPY WITH ENDOBRONCHIAL NAVIGATION N/A 01/08/2020   Procedure: VIDEO BRONCHOSCOPY WITH ENDOBRONCHIAL NAVIGATION;  Surgeon: Collene Gobble, MD;  Location: [facility];  Service: Thoracic;  Laterality: N/A;   Family History  Problem Relation Age of Onset   Allergies Mother    Asthma Mother    Lung cancer Mother    Breast cancer Mother 51       recurrence age 28   Colon cancer Father 58   Colon polyps Father 62   Prostate cancer Brother 29   Breast cancer Sister 80       Recurrence age 6 BRCA negative   Colon polyps Sister    Leukemia Sister    Breast cancer Sister 33   Prostate cancer Brother 76   Breast cancer Maternal Grandmother 28   Colon cancer Maternal Aunt    Leukemia Maternal Grandfather    Lung cancer Maternal Aunt    Breast cancer Cousin    Esophageal cancer Neg Hx    Rectal cancer Neg Hx    Stomach cancer Neg Hx    Social History   Socioeconomic History   Marital status: Single    Spouse name: Not  on file   Number of children: 0   Years of education: Not on file   Highest education level: Not on file  Occupational History   Occupation: OFFICE MANAGER    Employer: [employer]  Tobacco Use   Smoking status: Former    Packs/day: 2.00    Years: 18.00    Pack years: 36.00    Types: Cigarettes    Quit date: 07/12/1987    Years since quitting: 33.8   Smokeless tobacco: Never  Vaping Use   Vaping Use: Never used  Substance and Sexual Activity   Alcohol use: Yes    Comment: 1 time per month   Drug use: No   Sexual activity: Yes  Other Topics Concern   Not on file  Social History Narrative   Not on file   Social Determinants of Health   Financial Resource Strain: Low Risk    Difficulty of Paying Living Expenses: Not hard at all  Food Insecurity: No Food Insecurity   Worried About Charity fundraiser in the Last Year: Never true   [place] in the Last Year: Never true  Transportation Needs: No Transportation Needs   Lack of Transportation (Medical): No   Lack of Transportation (Non-Medical): No  Physical Activity: Insufficiently Active   Days of Exercise per Week: 3 days   Minutes of Exercise per Session: 20 min  Stress: No Stress Concern Present   Feeling of Stress : Not at all  Social Connections: Moderately Isolated   Frequency of Communication with Friends and Family: More than three times a week   Frequency of Social Gatherings with Friends and Family: More than three times a week   Attends Religious Services: More than 4 times per year   Active Member of [employer] or Organizations: No   Attends Archivist Meetings: Never   Marital Status: Widowed    Tobacco Counseling Counseling given: Not Answered   Clinical Intake:  Pre-visit preparation completed: Yes  Pain : No/denies pain     Diabetes: No  How often do you need to have someone help you when you read instructions, pamphlets, or other written materials from your doctor or pharmacy?:  1 - Never What is the last grade level you completed in school?: college degree  Diabetic?N/A  Interpreter Needed?: No      Activities of Daily Living In your present state of health, do you have any difficulty performing the following activities: 05/26/2021  Hearing? N  Vision? N  Difficulty concentrating or making decisions? N  Walking or climbing stairs? N  Dressing or bathing? N  Doing errands, shopping? N  Preparing Food and eating ? N  Using the Toilet? N  In the past six months, have you accidently leaked urine? N  Do you have problems with loss of bowel control? N  Managing your Medications? N  Managing your Finances? N  Housekeeping or managing your Housekeeping? N  Some recent data might be hidden    Patient Care Team: Baxley, Cresenciano Lick, MD as PCP - General (Internal Medicine) Magrinat, Virgie Dad, MD as Consulting Physician (Oncology) Collene Gobble, MD as Consulting Physician (Pulmonary Disease) [name], MD as Referring Physician Armbruster, Carlota Raspberry, MD as Consulting Physician (Gastroenterology) Kem Boroughs, [place] (Family Medicine) [name], MD as Consulting Physician (Radiation Oncology) Rolm Bookbinder, MD as Consulting Physician (General Surgery) Larey Dresser, MD as Consulting Physician (Cardiology) Bensimhon, Shaune Pascal, MD as Consulting Physician (Cardiology)  Indicate any recent Medical Services you may have received from other than [facility] providers in the past year (date may be approximate).     Assessment:   This is a routine wellness examination for [facility].  Hearing/Vision screen No results found.  Dietary issues and exercise activities discussed: Current Exercise Habits: The patient does not participate in regular exercise at present, Exercise limited by: orthopedic condition(s)   Goals Addressed   None    Depression Screen PHQ 2/9 Scores 05/26/2021 02/08/2021 08/11/2020 07/22/2020 06/25/2020 04/23/2020 03/09/2020  PHQ - 2  Score 0 0 0 0 0 0 0    Fall Risk Fall Risk  05/26/2021 04/15/2021 02/08/2021 08/11/2020 07/22/2020  Falls in the past year? 0 0 0  0 0  Number falls in past yr: 0 0 - 0 0  Injury with Fall? 0 0 - 0 0  Comment - - - - -  Risk for fall due to : No Fall Risks - No Fall Risks - -  Follow up Falls evaluation completed - - - -    FALL RISK PREVENTION PERTAINING TO THE HOME:  Any stairs in or around the home? Yes  If so, are there any without handrails? Yes  Home free of loose throw rugs in walkways, pet beds, electrical cords, etc? No  Adequate lighting in your home to reduce risk of falls? Yes   ASSISTIVE DEVICES UTILIZED TO PREVENT FALLS:  Life alert? No  Use of a cane, walker or w/c? No  Grab bars in the bathroom? Yes  Shower chair or bench in shower? No  Elevated toilet seat or a handicapped toilet? No   TIMED UP AND GO:  Was the test performed? No .  Length of time to ambulate 10 feet: n/a sec.     Cognitive Function:     6CIT Screen 05/26/2021  What Year? 0 points  What month? 0 points  What time? 0 points  Count back from 20 0 points  Months in reverse 0 points  Repeat phrase 0 points  Total Score 0    Immunizations Immunization History  Administered Date(s) Administered   Fluad Quad(high Dose 65+) 04/07/2021   Influenza Split 04/13/2011, 05/02/2012   Influenza,inj,Quad PF,6+ Mos 04/10/2013, 04/23/2014, 05/06/2015, 04/08/2016, 04/21/2017, 04/20/2018, 04/19/2019, 04/17/2020   Influenza-Unspecified 04/10/2013, 04/23/2014, 05/06/2015, 04/08/2016, 04/21/2017   PFIZER(Purple Top)SARS-COV-2 Vaccination 08/24/2019, 09/18/2019, 05/25/2020, 02/04/2021   Pneumococcal Conjugate-13 09/07/2018   Pneumococcal Polysaccharide-23 06/14/2017   Tdap 04/07/2003, 05/14/2013   Zoster, Live 12/19/2013    TDAP status: Up to date  Flu Vaccine status: Up to date  Pneumococcal vaccine status: Up to date  Covid-19 vaccine status: Information provided on how to obtain vaccines.    Qualifies for Shingles Vaccine? Yes   Zostavax completed Yes   Shingrix Completed?: No.    Education has been provided regarding the importance of this vaccine. Patient has been advised to call insurance company to determine out of pocket expense if they have not yet received this vaccine. Advised may also receive vaccine at local pharmacy or Health Dept. Verbalized acceptance and understanding.  Screening Tests Health Maintenance  Topic Date Due   Hepatitis C Screening  Never done   Zoster Vaccines- Shingrix (1 of 2) Never done   COVID-19 Vaccine (5 - Booster for Pfizer series) 04/01/2021   TETANUS/TDAP  05/15/2023   COLONOSCOPY (Pts 45-38yr Insurance coverage will need to be confirmed)  11/13/2025   Pneumonia Vaccine 70 Years old  Completed   INFLUENZA VACCINE  Completed   DEXA SCAN  Completed   HPV VACCINES  Aged Out   MAMMOGRAM  Discontinued    Health Maintenance  Health Maintenance Due  Topic Date Due   Hepatitis C Screening  Never done   Zoster Vaccines- Shingrix (1 of 2) Never done   COVID-19 Vaccine (5 - Booster for Pfizer series) 04/01/2021    Colorectal cancer screening: Type of screening: Colonoscopy. Completed 11/13/20. Repeat every 5 years  Mammogram status: No longer required due to Medical decision.  Bone Density status: Ordered and scheduled for 10/21/2021. Pt provided with contact info and advised to call to schedule appt.  Lung Cancer Screening: (Low Dose CT Chest recommended if Age 69-80years, 30 pack-year currently smoking OR have  quit w/in 15years.) does qualify.   Lung Cancer Screening Referral: n/a  Additional Screening:  Hepatitis C Screening: does qualify; Completed N/A  Vision Screening: Recommended annual ophthalmology exams for early detection of glaucoma and other disorders of the eye. Is the patient up to date with their annual eye exam?  Yes  Who is the provider or what is the name of the office in which the patient attends annual eye  exams? Dr Herbert Deaner If pt is not established with a provider, would they like to be referred to a provider to establish care? No .   Dental Screening: Recommended annual dental exams for proper oral hygiene  Community Resource Referral / Chronic Care Management: CRR required this visit?  No   CCM required this visit?  No      Plan:     I have personally reviewed and noted the following in the patient's chart:   Medical and social history Use of alcohol, tobacco or illicit drugs  Current medications and supplements including opioid prescriptions.  Functional ability and status Nutritional status Physical activity Advanced directives List of other physicians Hospitalizations, surgeries, and ER visits in previous 12 months Vitals Screenings to include cognitive, depression, and falls Referrals and appointments  In addition, I have reviewed and discussed with patient certain preventive protocols, quality metrics, and best practice recommendations. A written personalized care plan for preventive services as well as general preventive health recommendations were provided to patient.     Angus Seller, CMA   05/26/2021   Nurse Notes: Non face to face  31 minute visit.   Ms. Coto , Thank you for taking time to come for your Medicare Wellness Visit. I appreciate your ongoing commitment to your health goals. Please review the following plan we discussed and let me know if I can assist you in the future.   These are the goals we discussed:  Goals   None     This is a list of the screening recommended for you and due dates:  Health Maintenance  Topic Date Due   Hepatitis C Screening: USPSTF Recommendation to screen - Ages 68-79 yo.  Never done   Zoster (Shingles) Vaccine (1 of 2) Never done   COVID-19 Vaccine (5 - Booster for Pfizer series) 04/01/2021   Tetanus Vaccine  05/15/2023   Colon Cancer Screening  11/13/2025   Pneumonia Vaccine  Completed   Flu Shot  Completed    DEXA scan (bone density measurement)  Completed   HPV Vaccine  Aged Out   Mammogram  Discontinued

## 2021-05-26 NOTE — Patient Instructions (Signed)

## 2021-05-31 ENCOUNTER — Encounter: Payer: Self-pay | Admitting: Internal Medicine

## 2021-06-04 ENCOUNTER — Telehealth: Payer: Self-pay | Admitting: Pharmacist

## 2021-06-04 NOTE — Telephone Encounter (Signed)
Patient picked up 2 bottles of clofazimine (~3 month's supply) on 11/23.

## 2021-06-16 ENCOUNTER — Ambulatory Visit (INDEPENDENT_AMBULATORY_CARE_PROVIDER_SITE_OTHER): Payer: Medicare Other

## 2021-06-16 ENCOUNTER — Ambulatory Visit (INDEPENDENT_AMBULATORY_CARE_PROVIDER_SITE_OTHER): Payer: Medicare Other | Admitting: Internal Medicine

## 2021-06-16 ENCOUNTER — Other Ambulatory Visit: Payer: Self-pay

## 2021-06-16 ENCOUNTER — Encounter: Payer: Self-pay | Admitting: Internal Medicine

## 2021-06-16 DIAGNOSIS — A310 Pulmonary mycobacterial infection: Secondary | ICD-10-CM | POA: Diagnosis not present

## 2021-06-16 DIAGNOSIS — Z23 Encounter for immunization: Secondary | ICD-10-CM | POA: Diagnosis not present

## 2021-06-16 NOTE — Assessment & Plan Note (Signed)
She continues to have clinical improvement on therapy for Mycobacterium avium pneumonia despite having persistently positive sputum cultures.  She brought in a sputum specimen for repeat culture today.  She is in agreement with continuing her current antibiotics.  She has already received her annual influenza vaccine.  She received her COVID booster vaccine here today.  She will follow-up in 6 weeks.

## 2021-06-16 NOTE — Progress Notes (Signed)
   Covid-19 Vaccination Clinic  Name:  Anna Valentine    MRN: [id number] DOB: 08/02/1950  06/16/2021  [name] was observed post Covid-19 immunization for 15 minutes without incident. She was provided with Vaccine Information Sheet and instruction to access the V-Safe system.   [name] was instructed to call 911 with any severe reactions post vaccine: Difficulty breathing  Swelling of face and throat  A fast heartbeat  A bad rash all over body  Dizziness and weakness   Immunizations Administered     Name Date Dose VIS Date Route   Pfizer Covid-19 Vaccine Bivalent Booster 06/16/2021 10:50 AM 0.3 mL 03/10/2021 Intramuscular   Manufacturer: Laurie   Lot: [id number]   [place]: [place], RMA

## 2021-06-16 NOTE — Progress Notes (Signed)
[facility]  Patient Active Problem List   Diagnosis Date Noted   Mycobacterium avium infection ([place]) 02/11/2020    Priority: High   Hearing loss 11/04/2020    Priority: Medium    Intractable nausea and vomiting 02/20/2020    Priority: Medium    Weight loss, non-intentional 09/20/2019    Priority: Medium    Skin rash 02/28/2020   Hyponatremia    Dehydration with hyponatremia 02/20/2020   Low back pain 02/20/2020   Chronic diastolic CHF (congestive heart failure) ([place]) 02/20/2020   Essential hypertension 94/76/5465   Acute metabolic encephalopathy 03/54/6568   Right epiretinal membrane 01/30/2020   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   Nuclear sclerotic cataract of right eye 01/30/2020   Nuclear sclerotic cataract of left eye 01/30/2020   Iron deficiency anemia 01/20/2020   Malignant neoplasm of overlapping sites of right breast in female, estrogen receptor positive ([place]) 09/20/2019   Postprocedural pneumothorax    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   Port-A-Cath in place 04/26/2018   Genetic testing 02/20/2018   Family history of breast cancer    Family history of lung cancer    Personal history of lung cancer    Chest pain 11/28/2017   Abnormal CT of the chest 11/30/2016   Malignant neoplasm of upper-inner quadrant of left breast in female, estrogen receptor positive ([place]) 06/16/2014   Osteopenia 09/13/2013   Postmenopausal atrophic vaginitis 09/13/2013   Vitamin D deficiency 09/13/2013   Malignant neoplasm of lower lobe of right lung ([place]) 07/24/2012   Anxiety 03/11/2011   History of neutropenia 03/11/2011   Family history of colon cancer 03/11/2011   Bronchiectasis without complication ([place]) 12/75/1700    Patient's Medications  New Prescriptions   No medications on file  Previous Medications   ACYCLOVIR (ZOVIRAX) 400 MG TABLET    TAKE 1 TABLET BY MOUTH TWICE DAILY   ALBUTEROL (VENTOLIN HFA) 108 (90 BASE) MCG/ACT INHALER    INHALE 2  PUFFS INTO LUNGS EVERY 6 HOURS AS NEEDED FOR WHEEZING OR SHORTNESS OF BREATH.   APIXABAN (ELIQUIS) 5 MG TABS TABLET    Take 1 tablet (5 mg total) by mouth 2 (two) times daily.   AZITHROMYCIN (ZITHROMAX) 500 MG TABLET    Take 500 mg by mouth daily.   CLOFAZIMINE POWD    100 mg by Does not apply route every morning.   ETHAMBUTOL (MYAMBUTOL) 400 MG TABLET    Take 2 tablets (800 mg total) by mouth daily.   IBUPROFEN (ADVIL) 800 MG TABLET    Take 1 tablet (800 mg total) by mouth every 8 (eight) hours as needed.   LETROZOLE (FEMARA) 2.5 MG TABLET    Take 2.5 mg by mouth daily.   MULTIPLE VITAMINS-MINERALS (ONE-A-DAY WOMENS PO)    Take by mouth.   PROMETHAZINE (PHENERGAN) 12.5 MG TABLET    Take 1 tablet (12.5 mg total) by mouth every 8 (eight) hours as needed.  Modified Medications   No medications on file  Discontinued Medications   No medications on file    Subjective: Anna Valentine is in for her routine follow-up visit.  She started on azithromycin, clofazimine and ethambutol in January for her smoldering Mycobacterium avium pneumonia.  She had not tolerated a rifampin based regimen last year.  She feels like she is tolerating her medications well now.  She has been having some intermittent diarrhea but that seems to have improved after stopping her multivitamin and starting a probiotic recently.  Her cough  has improved and she now has only occasional cough and sputum production.  She has had no change in her chronic dyspnea on exertion.  Her appetite is good and she has been able to gain some weight.  She recently started Eliquis after the chest CT scan in late September showed a blood clot.  Review of Systems: Review of Systems  Constitutional:  Negative for chills, diaphoresis, fever and weight loss.  Respiratory:  Positive for cough, sputum production and shortness of breath. Negative for hemoptysis and wheezing.   Cardiovascular:  Negative for chest pain.  Gastrointestinal:  Positive for diarrhea.  Negative for abdominal pain, nausea and vomiting.  Musculoskeletal:  Positive for back pain.   Past Medical History:  Diagnosis Date   Anemia    hx of IDA   BRCA negative 2011   BRCA I/ II negative   Breast cancer ([place]) 08/2009   LEFT=stage 2, rx with lumpectomy and xrt   Breast cancer ([place]) 2019   RIGHT   Bronchiectasis ([place])    Chronic diastolic CHF (congestive heart failure) ([place]) 02/20/2020   Constipation    Essential hypertension 02/20/2020   Family history of adverse reaction to anesthesia    My Sister has nausea   Family history of breast cancer    Family history of colon cancer    Family history of lung cancer    GERD (gastroesophageal reflux disease)    not presently having symptoms   Hypertension    Iatrogenic pneumothorax 02/06/2019   Lung cancer ([place]) 06/06/05   stage 1 poorly differentiated adenocarcinoma, s/p right lower lobectomy.   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   Osteopenia 09/13/2013   Osteoporosis    Personal history of lung cancer    Pneumonia    2007ish, walking pnemonia - 2009 ish   STD (sexually transmitted disease)    HSV   Vitamin D deficiency 09/13/2013    Social History   Tobacco Use   Smoking status: Former    Packs/day: 2.00    Years: 18.00    Pack years: 36.00    Types: Cigarettes    Quit date: 07/12/1987    Years since quitting: 33.9   Smokeless tobacco: Never  Vaping Use   Vaping Use: Never used  Substance Use Topics   Alcohol use: Yes    Comment: 1 time per month   Drug use: No    Family History  Problem Relation Age of Onset   Allergies Mother    Asthma Mother    Lung cancer Mother    Breast cancer Mother 45       recurrence age 63   Colon cancer Father 67   Colon polyps Father 47   Prostate cancer Brother 56   Breast cancer Sister 54       Recurrence age 58 BRCA negative   Colon polyps Sister    Leukemia Sister    Breast cancer Sister 66   Prostate cancer Brother 17   Breast cancer Maternal Grandmother  32   Colon cancer Maternal Aunt    Leukemia Maternal Grandfather    Lung cancer Maternal Aunt    Breast cancer Cousin    Esophageal cancer Neg Hx    Rectal cancer Neg Hx    Stomach cancer Neg Hx     Allergies  Allergen Reactions   Rifampin Nausea And Vomiting   Taxol [Paclitaxel] Anaphylaxis   Paclitaxel (Protein-Bound) Hives   Tape Itching   Abraxane [Paclitaxel Protein-Bound  Part] Rash   Clarithromycin Rash    Objective: Vitals:   06/16/21 1020  BP: 112/80  Pulse: 91  Temp: (!) 97.4 F (36.3 C)  TempSrc: Oral  SpO2: 100%  Weight: 114 lb (51.7 kg)   Body mass index is 18.4 kg/m.  Physical Exam Constitutional:      Comments: She is in good spirits.  Her weight is up 21 pounds since January.  Cardiovascular:     Rate and Rhythm: Normal rate and regular rhythm.     Heart sounds: No murmur heard. Pulmonary:     Effort: Pulmonary effort is normal.     Breath sounds: No wheezing, rhonchi or rales.  Psychiatric:        Mood and Affect: Mood normal.    Lab Results Sputum AFB smear 03/09/2021: Negative Sputum AFB culture 03/09/2021: Mycobacterium avium  Problem List Items Addressed This Visit       High   Mycobacterium avium infection ([place])    She continues to have clinical improvement on therapy for Mycobacterium avium pneumonia despite having persistently positive sputum cultures.  She brought in a sputum specimen for repeat culture today.  She is in agreement with continuing her current antibiotics.  She has already received her annual influenza vaccine.  She received her COVID booster vaccine here today.  She will follow-up in 6 weeks.       Michel Bickers, MD [facility] [phone] pager   [phone] cell 06/16/2021, 10:49 AM

## 2021-06-16 NOTE — Addendum Note (Signed)
Addended by: Caffie Pinto on: 06/16/2021 10:58 AM   Modules accepted: Orders

## 2021-06-18 ENCOUNTER — Telehealth: Payer: Self-pay

## 2021-06-18 NOTE — Telephone Encounter (Signed)
Received call from [facility] 06/18/21 1448 to report abnormal results on mycobacterium culture collected on 06/16/21.   Patient has had multiple positive sputum cultures and is currently on treatment for MAC.  Will route to provider.   Beryle Flock, RN

## 2021-06-21 ENCOUNTER — Encounter: Payer: Self-pay | Admitting: Internal Medicine

## 2021-06-21 ENCOUNTER — Telehealth: Payer: Self-pay | Admitting: Internal Medicine

## 2021-06-21 ENCOUNTER — Telehealth: Payer: Self-pay

## 2021-06-21 MED ORDER — HYDROCODONE-ACETAMINOPHEN 5-325 MG PO TABS: 1.0000 | ORAL_TABLET | Freq: Three times a day (TID) | ORAL | 0 refills | Status: DC | PRN

## 2021-06-21 NOTE — Telephone Encounter (Signed)
She will take pain med if ok to take with eliquis. She has an appt with oncologist but not until April. She wants to know if you think she should call and try to get in before the 28th with Dr Lamonte Sakai

## 2021-06-21 NOTE — Telephone Encounter (Signed)
I have personally reviewed this phone note and have sent in electronically [place] ( #15) tabs to take sparingly every 8 hours as needed for musculoskeletal pain. MJB, MD

## 2021-06-21 NOTE — Telephone Encounter (Signed)
Patient called and stated that she was having the intense back pain and tight chest like she has had in the past. She states that this is an ongoing problem for her not new and no worse than usual. She has tried  tylenol for pain since she has been told not to take ibuprofen due to blood thinners but gets little relief. I tried to ask her to go be seen and she states its not new. She wants to know your thoughts. I have advised her if it doesn't resolve or gets worse to go to ER . Advised provider out of office.

## 2021-06-21 NOTE — Telephone Encounter (Signed)
She will call and see if she can get in sooner or put on a cancellation schedule. She isnt able to check her pulse ox but she will and if it is not good she will let us know. She is ok with the pain medication being available tomorrow.

## 2021-07-02 ENCOUNTER — Other Ambulatory Visit (HOSPITAL_COMMUNITY): Payer: Self-pay | Admitting: *Deleted

## 2021-07-02 DIAGNOSIS — Z170 Estrogen receptor positive status [ER+]: Secondary | ICD-10-CM

## 2021-07-02 DIAGNOSIS — I5032 Chronic diastolic (congestive) heart failure: Secondary | ICD-10-CM

## 2021-07-07 ENCOUNTER — Other Ambulatory Visit: Payer: Self-pay

## 2021-07-07 ENCOUNTER — Encounter: Payer: Self-pay | Admitting: Emergency Medicine

## 2021-07-07 ENCOUNTER — Other Ambulatory Visit: Payer: Self-pay | Admitting: *Deleted

## 2021-07-07 ENCOUNTER — Ambulatory Visit (INDEPENDENT_AMBULATORY_CARE_PROVIDER_SITE_OTHER): Payer: Medicare Other | Admitting: Emergency Medicine

## 2021-07-07 VITALS — BP 122/70 | HR 82 | Temp 97.7°F | Ht 66.0 in | Wt 114.2 lb

## 2021-07-07 DIAGNOSIS — C3431 Malignant neoplasm of lower lobe, right bronchus or lung: Secondary | ICD-10-CM

## 2021-07-07 DIAGNOSIS — A310 Pulmonary mycobacterial infection: Secondary | ICD-10-CM

## 2021-07-07 DIAGNOSIS — R0789 Other chest pain: Secondary | ICD-10-CM

## 2021-07-07 NOTE — Progress Notes (Signed)
Subjective:    Patient ID: Anna Valentine, female    DOB: 08/02/1950, 70 y.o.   MRN: [id number]  HPI  ROV 11/05/2020 -- Anna Valentine is 70, follows up today for her complicated CT scan of the chest that we have ascribed to severe residual bronchiectasis and lung cavitation due to Mycobacterium avium.  She has been on therapy for this since 07/2020.  She also has a history of bilateral breast cancer on tamoxifen, non-small cell lung cancer post right lower lobe lobectomy.  She continues to have exertional dyspnea, is using her albuterol about once daily. She is going to undergo colonoscopy EGD with Dr. Havery Moros for nausea (improving). She saw Dr. Megan Salon yesterday.  Overall she has had some clinical improvement although her dyspnea persists.  He was encouraged and willing to continue 3 drug therapy. Less nausea, better energy, less cough / sputum, better appetite, no sweats.   CT-PA done 10/02/2020 reviewed by me shows some thrombus in the residual stump of the right lower lobe pulmonary artery that appears to be clot in situ post her prior right lower lobe lobectomy.  There is no sign of any other pulmonary embolism.  There is now micronodular involvement in the lingula as well as in the left lower lobe consistent with atypical infection.   ROV 04/09/21 --70 year old woman who follows up today for history of right lower lobe non-small cell lung cancer, mycobacterial colonization and infection with significant right lung destruction and distortion.  She also has a history of breast cancer.  Her most recent imaging also showed some clot formation in the right lower lobe PA stump.  Probable asthma associated with her bronchiectasis.  She feels better overall - no fevers, no cough, gaining some wt. Still w some exertional dyspnea. Uses albuterol most mornings.   CT-PA scan of the chest performed on 04/05/2021 and reviewed by me shows persistence of the filling defect in the posterior aspect of the stump  of her pulmonary artery, slightly enlarged compared with 10/02/2020 but with no other evidence of pulmonary embolism and the rest of the vasculature.  No significant lymphadenopathy.  Continued scattered areas of bronchiectasis that her not significantly changed fibrocavitary disease.  There is some evidence of bronchiectasis in the lingula, also better   ROV 07/07/21 --70 year old woman with complicated history that includes right lower lobe non-small cell lung cancer post lobectomy, mycobacterial infection with significant impact on the rest of her right lung with obstruction and distortion.  Also with a history of breast cancer.  We have been following serial imaging on antibiotic therapy for MAIC, azithromycin and ethambutol.  I started her on Eliquis for clot noted in her right lower lobe PA stump starting in November. She reports that she has been having intermittent R back pain that seems to be MSK, after a strain carrying something. She has a compression fracture, ? Related. Started in September. Then an episode of chest and back pain in December, better after 2 days. Resolved w hydrocodone. No increase in cough. Expect that she will be on abx for a years after the sputum is negative.   AFB 12/7 >> AFB still positive.    Review of Systems  Constitutional:  Positive for activity change and fatigue. Negative for fever and unexpected weight change.  HENT: Negative.  Negative for congestion, dental problem, ear pain, nosebleeds, postnasal drip, rhinorrhea, sinus pressure, sneezing, sore throat and trouble swallowing.   Eyes: Negative.  Negative for redness and itching.  Respiratory: Negative.  Negative for cough, chest tightness, shortness of breath and wheezing.   Cardiovascular: Negative.  Negative for palpitations and leg swelling.  Gastrointestinal: Negative.  Negative for nausea and vomiting.  Genitourinary: Negative.  Negative for dysuria.  Musculoskeletal:  Positive for back pain. Negative  for joint swelling.  Skin: Negative.  Negative for rash.  Neurological: Negative.  Negative for headaches.  Hematological: Negative.  Does not bruise/bleed easily.  Psychiatric/Behavioral: Negative.  Negative for dysphoric mood. The patient is not nervous/anxious.        Objective:   Physical Exam Vitals:   07/07/21 1023  BP: 122/70  Pulse: 82  Temp: 97.7 F (36.5 C)  TempSrc: Oral  SpO2: 97%  Weight: 114 lb 3.2 oz (51.8 kg)  Height: 5\' 6"  (1.676 m)   Gen: stronger, thin woman, well-nourished, in no distress  ENT: No lesions,  mouth clear,  oropharynx clear, no postnasal drip  Neck: No JVD, no stridor  Lungs: No use of accessory muscles, coarse R lung with some inspiratory and expiratory rhonchi. L is clear  Cardiovascular: RRR, heart sounds normal, no murmur or gallops, no peripheral edema  Musculoskeletal: No deformities, no cyanosis or clubbing  Neuro: alert, non focal  Skin: Warm, no lesions or rash     Assessment & Plan:  Malignant neoplasm of lower lobe of right lung ([name]) Complicated case with multiple issues involving her right lung but given her history of primary malignancy, new chest discomfort through to her back, believe we need to repeat her CT scan of the chest now.  I will do this with contrast since she has thrombus and her right lower lobe pulmonary arterial stump (on Eliquis).  Follow-up with her after to review  Mycobacterium avium infection ([place]) Remains on azithromycin and ethambutol.  She had surveillance AFB cultures done on 06/16/2021 that are still positive.  Suspect that she will need therapy for at least a year after her cultures converted to negative.  She is following closely with Dr. Megan Salon.  CT chest planned as above, will help [place] sort out whether there is any further parenchymal disease from her [facility]  Baltazar Apo, MD, PhD 07/07/2021, 1:25 PM [facility] [phone] or if no answer [phone]

## 2021-07-07 NOTE — Assessment & Plan Note (Signed)
Remains on azithromycin and ethambutol.  She had surveillance AFB cultures done on 06/16/2021 that are still positive.  Suspect that she will need therapy for at least a year after her cultures converted to negative.  She is following closely with Dr. Megan Salon.  CT chest planned as above, will help [place] sort out whether there is any further parenchymal disease from her [facility]

## 2021-07-07 NOTE — Patient Instructions (Addendum)
Keep your albuterol available use 2 puffs when you need it for short of breath, chest tightness, wheezing. Continue your current antibiotics as directed by Dr. Megan Salon. We reviewed your sputum AFB culture stain today. We will continue Eliquis for now.  Probably continue at least through February We will perform a CT scan of the chest with contrast Follow Dr. Lamonte Sakai next available after your CT so we can review the results together.

## 2021-07-07 NOTE — Assessment & Plan Note (Signed)
Complicated case with multiple issues involving her right lung but given her history of primary malignancy, new chest discomfort through to her back, believe we need to repeat her CT scan of the chest now.  I will do this with contrast since she has thrombus and her right lower lobe pulmonary arterial stump (on Eliquis).  Follow-up with her after to review

## 2021-07-09 ENCOUNTER — Encounter: Payer: Self-pay | Admitting: Internal Medicine

## 2021-07-09 NOTE — Progress Notes (Signed)
° °  Subjective:    Patient ID: Anna Valentine, female    DOB: 1950-09-26, 70 y.o.   MRN: [id number]  HPI 70 year old Female with history of Mycobacterium AVM infection followed by Dr. Megan Salon, osteoporosis, history of breast cancer followed by Dr. Jana Hakim, remote history of lung cancer call with complaint of back pain.  History of thoracic compression fracture in September 2022 after lifting heavy suitcase.  Pain was in her thoracic back area around her breast.  Had compression fracture T7 with up to 60% anterior height loss.  This healed by itself without intervention.  Due to the coronavirus pandemic, she is seen today via interactive audio and video telecommunications.  She is at her home and I am at my office.  She is identified using 2 identifiers as Anna Valentine, a patient in this practice for many years.  She is agreeable to visit in this format today.  She called earlier today indicating she had lower back pain on the left side for some 10 days.  She lifted a heavy piece of furniture in her home around that time and she believes that to be the cause of her back pain.  Previously had T7 compression fracture.  This is more lumbar back pain.  She prefers virtual visit today rather than in person evaluation.  Explained to her cannot rule out another compression fracture with with his heavy lifting that she did.  She is not having radiculopathy symptoms just low back pain.  When I asked her if she has pain with straight leg raising, she says she does not.  Says she does not notice any significant lower extremity weakness in 1 leg or another.  Simply has low back pain.  Have prescribed ibuprofen 800 mg to take every 8 hours as needed.  Rest, use heating pad and avoid heavy lifting.  Call if symptoms not improving.    Review of Systems see above     Objective:   Physical Exam  Reports straight leg raising to be negative      Assessment & Plan:  Acute left-sided back pain likely  mechanical from lifting heavy furniture without sciatica  Plan: Ibuprofen 800 mg 3 times daily as needed.  Use heating pad.  Rest.  Avoid heavy lifting and call if not improving in 2 to 3 days.  Also has osteoporosis and history of lung and breast cancer.  History of Mycobacterium AVM infection.  Bone density study ordered.  Time spent with video visit including reviewing chart, interviewing patient and medical decision making is 20 minutes

## 2021-07-13 ENCOUNTER — Telehealth: Payer: Self-pay

## 2021-07-13 ENCOUNTER — Encounter: Payer: Self-pay | Admitting: Internal Medicine

## 2021-07-13 ENCOUNTER — Ambulatory Visit
Admission: RE | Admit: 2021-07-13 | Discharge: 2021-07-13 | Disposition: A | Discharge status: Home / Self Care | Payer: Medicare Other | Source: Ambulatory Visit | Attending: Internal Medicine | Admitting: Internal Medicine

## 2021-07-13 ENCOUNTER — Ambulatory Visit (INDEPENDENT_AMBULATORY_CARE_PROVIDER_SITE_OTHER): Payer: Medicare Other | Admitting: Internal Medicine

## 2021-07-13 ENCOUNTER — Other Ambulatory Visit: Payer: Self-pay

## 2021-07-13 VITALS — BP 112/76 | HR 85 | Temp 98.4°F | Ht 66.0 in | Wt 108.0 lb

## 2021-07-13 DIAGNOSIS — J90 Pleural effusion, not elsewhere classified: Secondary | ICD-10-CM | POA: Diagnosis not present

## 2021-07-13 DIAGNOSIS — R0781 Pleurodynia: Secondary | ICD-10-CM | POA: Diagnosis not present

## 2021-07-13 DIAGNOSIS — R918 Other nonspecific abnormal finding of lung field: Secondary | ICD-10-CM | POA: Diagnosis not present

## 2021-07-13 DIAGNOSIS — J984 Other disorders of lung: Secondary | ICD-10-CM | POA: Diagnosis not present

## 2021-07-13 MED ORDER — HYDROCODONE-ACETAMINOPHEN 10-325 MG PO TABS: ORAL_TABLET | ORAL | 0 refills | Status: DC

## 2021-07-13 NOTE — Telephone Encounter (Signed)
Patient called with back pain associated with chest pain. She was advised to see pulmonology. She states that she has and a lung Ct has been ordered for next Wednesday. She states that she had a friend come over and put on solanpas patch and the area where it hurt was a knot and it was very tender.She would like to have it check out. Please advise.

## 2021-07-13 NOTE — Telephone Encounter (Signed)
Need to resend pain medication with a maximum pill per day. Is it once a day?

## 2021-07-13 NOTE — Progress Notes (Signed)
° °  Subjective:    Patient ID: Anna Valentine, female    DOB: October 08, 1950, 71 y.o.   MRN: [id number]  HPI 71 year old Female with complex past medical history.  She has history of Mycobacterium AVM infection and is followed by Dr. Megan Salon.  She has had issues recently with musculoskeletal pain including left-sided low back pain and rib pain.  Today has rib pain on her left side.  Followed by cardiology for chronic diastolic heart failure.  Followed closely by Pulmonary.  She has a remote history of right lower lobe non -small cell lung cancer, mycobacterial colonization and infection with significant right lung destruction and distortion.  She has a history of breast cancer.  There has been a clot identified in her right lower lobe pulmonary artery stump suggestive of chronic PE.  Due to have CT angio of chest January 12.  Has been having more issues with musculoskeletal pain.  She resides alone.  She is a widow.  She is quite anxious about her health.  There is no recent bone density study on file.  History of osteoporosis in 2013 with T score -2.5.  Bone density study has been ordered.    Review of Systems complaining of back pain associated with chest pain.  A friend came over and put on a Salonpas patch on her back and that seemed to help.  She would like this checked out.  Denies any overexertion or heavy lifting which is triggered some of these episodes in the past.  History of T7 compression fracture in the past.  Seen in November for left-sided low back pain for some 10 days.  This was treated with ibuprofen.  Seem to be related to lifting heavy furniture.     Objective:   Physical Exam Vital signs reviewed and are stable.  She is tender along her left posterior lateral rib cage area.  Her chest is clear to auscultation.  Her pulse oximetry is excellent at 98% on room air.  She is afebrile.  Her weight is 108 pounds and BMI is 17.43.  She looks frail.  Blood pressure stable at 112/76 and  pulse is 85 and regular.       Assessment & Plan:  Presumed musculoskeletal rib cage pain  Rib films ordered today and chest x-ray shows no acute or healing fractures and a stable chronic right pleural effusion and right lung airspace disease.  Plan: Norco 10/325 (number 15 tablets) 1/2 to 1 tablet up to 3 times daily every 8 hours as needed with food to take sparingly for pain.  Follow-up as indicated with Pulmonary in the near future.  I think she could benefit from Endocrine consult for osteoporosis.  She has upcoming bone density study scheduled for April.  She should have consultation with Endocrine after that for osteoporosis.  Time spent reviewing chart, interviewing patient, examining patient and medical decision making is 35 minutes

## 2021-07-13 NOTE — Telephone Encounter (Signed)
scheduled

## 2021-07-14 ENCOUNTER — Ambulatory Visit (HOSPITAL_COMMUNITY)
Admission: RE | Admit: 2021-07-14 | Discharge: 2021-07-14 | Disposition: A | Discharge status: Home / Self Care | Payer: Medicare Other | Source: Ambulatory Visit | Attending: Cardiology | Admitting: Cardiology

## 2021-07-14 ENCOUNTER — Ambulatory Visit (HOSPITAL_BASED_OUTPATIENT_CLINIC_OR_DEPARTMENT_OTHER)
Admission: RE | Admit: 2021-07-14 | Discharge: 2021-07-14 | Disposition: A | Discharge status: Home / Self Care | Payer: Medicare Other | Source: Ambulatory Visit | Attending: Cardiology | Admitting: Cardiology

## 2021-07-14 ENCOUNTER — Encounter (HOSPITAL_COMMUNITY): Payer: Self-pay | Admitting: Cardiology

## 2021-07-14 VITALS — BP 122/70 | HR 82 | Wt 108.0 lb

## 2021-07-14 DIAGNOSIS — E785 Hyperlipidemia, unspecified: Secondary | ICD-10-CM

## 2021-07-14 DIAGNOSIS — I358 Other nonrheumatic aortic valve disorders: Secondary | ICD-10-CM | POA: Diagnosis not present

## 2021-07-14 DIAGNOSIS — Z853 Personal history of malignant neoplasm of breast: Secondary | ICD-10-CM | POA: Insufficient documentation

## 2021-07-14 DIAGNOSIS — I5032 Chronic diastolic (congestive) heart failure: Secondary | ICD-10-CM | POA: Diagnosis not present

## 2021-07-14 DIAGNOSIS — I427 Cardiomyopathy due to drug and external agent: Secondary | ICD-10-CM | POA: Insufficient documentation

## 2021-07-14 DIAGNOSIS — Z9013 Acquired absence of bilateral breasts and nipples: Secondary | ICD-10-CM | POA: Diagnosis not present

## 2021-07-14 DIAGNOSIS — I272 Pulmonary hypertension, unspecified: Secondary | ICD-10-CM | POA: Insufficient documentation

## 2021-07-14 DIAGNOSIS — I2510 Atherosclerotic heart disease of native coronary artery without angina pectoris: Secondary | ICD-10-CM | POA: Insufficient documentation

## 2021-07-14 DIAGNOSIS — Z170 Estrogen receptor positive status [ER+]: Secondary | ICD-10-CM

## 2021-07-14 DIAGNOSIS — Z7901 Long term (current) use of anticoagulants: Secondary | ICD-10-CM | POA: Diagnosis not present

## 2021-07-14 DIAGNOSIS — T451X5D Adverse effect of antineoplastic and immunosuppressive drugs, subsequent encounter: Secondary | ICD-10-CM | POA: Insufficient documentation

## 2021-07-14 DIAGNOSIS — Z85118 Personal history of other malignant neoplasm of bronchus and lung: Secondary | ICD-10-CM | POA: Insufficient documentation

## 2021-07-14 DIAGNOSIS — Z08 Encounter for follow-up examination after completed treatment for malignant neoplasm: Secondary | ICD-10-CM | POA: Insufficient documentation

## 2021-07-14 DIAGNOSIS — J449 Chronic obstructive pulmonary disease, unspecified: Secondary | ICD-10-CM | POA: Diagnosis not present

## 2021-07-14 DIAGNOSIS — Z902 Acquired absence of lung [part of]: Secondary | ICD-10-CM | POA: Insufficient documentation

## 2021-07-14 DIAGNOSIS — I959 Hypotension, unspecified: Secondary | ICD-10-CM | POA: Diagnosis not present

## 2021-07-14 DIAGNOSIS — C50212 Malignant neoplasm of upper-inner quadrant of left female breast: Secondary | ICD-10-CM

## 2021-07-14 LAB — ECHOCARDIOGRAM COMPLETE
Area-P 1/2: 5.13 cm2
S' Lateral: 3.4 cm

## 2021-07-14 MED ORDER — PERFLUTREN LIPID MICROSPHERE
1.0000 mL | INTRAVENOUS | Status: DC | PRN
  Administered 2021-07-14: 3 mL via INTRAVENOUS
  Filled 2021-07-14: qty 10

## 2021-07-14 MED ORDER — ROSUVASTATIN CALCIUM 10 MG PO TABS: 10.0000 mg | ORAL_TABLET | Freq: Every day | ORAL | 11 refills | Status: DC

## 2021-07-14 NOTE — Patient Instructions (Signed)
EKG done today.  No Labs done today.   START Crestor 10mg  (1 tablet) by mouth daily.   No other medication changes were made. Please continue all current medications as prescribed.  Your physician recommends that you schedule a follow-up appointment in: 2 months for a lab only appointment and as needed with Dr. Aundra Dubin  If you have any questions or concerns before your next appointment please send [place] a message through [facility] or call our office at [phone].    TO LEAVE A MESSAGE FOR THE NURSE SELECT OPTION 2, PLEASE LEAVE A MESSAGE INCLUDING: YOUR NAME DATE OF BIRTH CALL BACK NUMBER REASON FOR CALL**this is important as we prioritize the call backs  YOU WILL RECEIVE A CALL BACK THE SAME DAY AS LONG AS YOU CALL BEFORE 4:00 PM   Do the following things EVERYDAY: Weigh yourself in the morning before breakfast. Write it down and keep it in a log. Take your medicines as prescribed Eat low salt foods--Limit salt (sodium) to 2000 mg per day.  Stay as active as you can everyday Limit all fluids for the day to less than 2 liters   At the [facility], you and your health needs are our priority. As part of our continuing mission to provide you with exceptional heart care, we have created designated Provider Care Teams. These Care Teams include your primary Cardiologist (physician) and Advanced Practice Providers (APPs- Physician Assistants and Nurse Practitioners) who all work together to provide you with the care you need, when you need it.   You may see any of the following providers on your designated Care Team at your next follow up: Dr Glori Bickers Dr Haynes Kerns, NP Lyda Jester, [place] Audry Riles, PharmD   Please be sure to bring in all your medications bottles to every appointment.

## 2021-07-14 NOTE — Progress Notes (Signed)
Oncology: Dr. Jana Hakim Cardiology: Dr. Aundra Dubin  71 y.o. with history of COPD, prior lung cancer, and breast cancer was referred by Dr. Jana Hakim for cardio-oncology evaluation given use of Herceptin. Patient had breast cancer diagnosed on left in 2/11, ER+/PR+/HER2-.  She had left lumpectomy and radiation.  Diagnosed in 7/19 with right breast cancer, ER+/PR-/HER2+.  She had bilateral mastectomy.  Plan initially for paclitaxel x 12 cycles and Herceptin x 1 year.  Intolerant of paclitaxel, switched to abraxane.    She had atypical chest pain in 6/19, Cardiolite at that time showed no ischemia.  Other than that incident, no cardiac history.  She is a prior smoker but has quit.  No exertional dyspnea or chest pain.    Echo in 3/20 showed significantly less negative strain, and Herceptin was held.  There is no plan to restart Herceptin.  She was started on Coreg. Repeat echo in 4/20 showed EF down to 50-55%, losartan was added.  Repeat echo in 6/20 showed EF lower again at 45-50%.  Echo in 8/20 showed EF 55%.  CTA chest in 9/22 showed thrombus in the right PA system at the stump from prior right lower lobectomy.    Echo was done today and reviewed.  Technically difficult study with EF 55-60%, poorly viewed RV.   She is now taking Eliquis with no BRBPR/melena.  She stopped Coreg and losartan due to low BP.  No exertional chest pain.  She has chronic dyspnea related to MAC infection. This has improved with antibiotic treatment.    ECG (personally reviewed): NSR, nonspecific T wave flattening   Labs (6/20): K 4.3, creatinine 0.77 Labs (7/20): K 4, creatinine 0.64, LDL 105  PMH: 1. Bronchiectasis: MAC noted.  2. Lung cancer: Adenocarcinoma.  In 11/06, had RUL wedge resection, right middle lobe wedge resection, and right lower lobectomy.  3. Breast cancer: Diagnosed on left in 2/11, ER+/PR+/HER2-.  She had left lumpectomy and radiation.  Diagnosed in 7/19 with right breast cancer, ER+/PR-/HER2+.  She had  bilateral mastectomy.  Plan initially for paclitaxel x 12 cycles and Herceptin x 1 year.  Intolerant of paclitaxel, switched to abraxane.   - Echo (12/19): EF 55-60%, GLS -19%, normal RV size and systolic function, PASP 48 mmHg - Echo (3/20): EF 55%, GLS -13.3%. Normal RV size and systolic function.  - Echo (4/20): EF 08-65%, grade 2 diastolic dysfunction, RV normal. - Echo (6/20): EF 78-46%, grade 2 diastolic dysfunction, normal RV, GLS -17.7%.  - Echo (8/20): EF 55%, GLS -16.1%, normal RV.  - Echo (1/23): Technically difficult study with EF 55-60%, poorly viewed RV.  4. Chest pain: Cardiolite (6/19) with EF 75%, no ischemia.  5. CTA chest in 9/22 showed thrombus in the right PA system at the stump from prior right lower lobectomy.   Social History   Socioeconomic History   Marital status: Single    Spouse name: Not on file   Number of children: 0   Years of education: Not on file   Highest education level: Not on file  Occupational History   Occupation: OFFICE MANAGER    Employer: [employer]  Tobacco Use   Smoking status: Former    Packs/day: 2.00    Years: 18.00    Pack years: 36.00    Types: Cigarettes    Quit date: 07/12/1987    Years since quitting: 34.0   Smokeless tobacco: Never  Vaping Use   Vaping Use: Never used  Substance and Sexual Activity   Alcohol use:  Yes    Comment: 1 time per month   Drug use: No   Sexual activity: Yes  Other Topics Concern   Not on file  Social History Narrative   Not on file   Social Determinants of Health   Financial Resource Strain: Low Risk    Difficulty of Paying Living Expenses: Not hard at all  Food Insecurity: No Food Insecurity   Worried About Charity fundraiser in the Last Year: Never true   [place] in the Last Year: Never true  Transportation Needs: No Transportation Needs   Lack of Transportation (Medical): No   Lack of Transportation (Non-Medical): No  Physical Activity: Insufficiently Active   Days of  Exercise per Week: 3 days   Minutes of Exercise per Session: 20 min  Stress: No Stress Concern Present   Feeling of Stress : Not at all  Social Connections: Moderately Isolated   Frequency of Communication with Friends and Family: More than three times a week   Frequency of Social Gatherings with Friends and Family: More than three times a week   Attends Religious Services: More than 4 times per year   Active Member of [employer] or Organizations: No   Attends Archivist Meetings: Never   Marital Status: Widowed  Human resources officer Violence: Not At Risk   Fear of Current or Ex-Partner: No   Emotionally Abused: No   Physically Abused: No   Sexually Abused: No   Family History  Problem Relation Age of Onset   Allergies Mother    Asthma Mother    Lung cancer Mother    Breast cancer Mother 33       recurrence age 23   Colon cancer Father 64   Colon polyps Father 27   Prostate cancer Brother 62   Breast cancer Sister 46       Recurrence age 99 BRCA negative   Colon polyps Sister    Leukemia Sister    Breast cancer Sister 66   Prostate cancer Brother 61   Breast cancer Maternal Grandmother 16   Colon cancer Maternal Aunt    Leukemia Maternal Grandfather    Lung cancer Maternal Aunt    Breast cancer Cousin    Esophageal cancer Neg Hx    Rectal cancer Neg Hx    Stomach cancer Neg Hx    ROS: All systems reviewed and negative except as per HPI.   Current Outpatient Medications  Medication Sig Dispense Refill   acyclovir (ZOVIRAX) 400 MG tablet TAKE 1 TABLET BY MOUTH TWICE DAILY 120 tablet prn   albuterol (VENTOLIN HFA) 108 (90 Base) MCG/ACT inhaler INHALE 2 PUFFS INTO LUNGS EVERY 6 HOURS AS NEEDED FOR WHEEZING OR SHORTNESS OF BREATH. 8.5 g PRN   apixaban (ELIQUIS) 5 MG TABS tablet Take 1 tablet (5 mg total) by mouth 2 (two) times daily. 60 tablet 2   azithromycin (ZITHROMAX) 500 MG tablet Take 500 mg by mouth daily.     Clofazimine POWD 100 mg by Does not apply route every  morning. 3000 g 11   ethambutol (MYAMBUTOL) 400 MG tablet Take 2 tablets (800 mg total) by mouth daily. 60 tablet 11   HYDROcodone-acetaminophen (NORCO) 10-325 MG tablet One half to one tab with food every 8 hours  as needed sparingly for pain. Note this is a stronger dose than before 15 tablet 0   letrozole (FEMARA) 2.5 MG tablet Take 2.5 mg by mouth daily.     Multiple Vitamins-Minerals (  ONE-A-DAY WOMENS PO) Take by mouth.     promethazine (PHENERGAN) 12.5 MG tablet Take 1 tablet (12.5 mg total) by mouth every 8 (eight) hours as needed. 30 tablet 1   rosuvastatin (CRESTOR) 10 MG tablet Take 1 tablet (10 mg total) by mouth daily. 30 tablet 11   No current facility-administered medications for this encounter.   BP 122/70    Pulse 82    Wt 49 kg (108 lb)    SpO2 99%    BMI 17.43 kg/m  General: NAD Neck: No JVD, no thyromegaly or thyroid nodule.  Lungs: Clear to auscultation bilaterally with normal respiratory effort. CV: Nondisplaced PMI.  Heart regular S1/S2, no S3/S4, no murmur.  No peripheral edema.  No carotid bruit.  Normal pedal pulses.  Abdomen: Soft, nontender, no hepatosplenomegaly, no distention.  Skin: Intact without lesions or rashes.  Neurologic: Alert and oriented x 3.  Psych: Normal affect. Extremities: No clubbing or cyanosis.  HEENT: Normal.   Assessment/Plan: 1. Breast cancer: I had concern in 3/20 for cardiotoxicity from Herceptin, and it has been stopped.  Per Dr. Jana Hakim, will not rechallenge.  Echo was done today and reviewed, EF is 55-60% though study was difficult.    - She is off Coreg and losartan with hypotension and can stay off them.  2. Pulmonary hypertension: By a prior echo, PASP 48 mmHg.  She has a history of bronchiectasis and also of extensive right lung resections for lung cancer.  Suspect group 3 pulmonary hypertension from lung disease.  3. CAD: Significant coronary calcification noted on CTs.   - We discussed statin use, she will start Crestor 10 mg  daily.  Lipids/LFTs in 2 months.  4. Pulmonary embolus: Noted at site of stump in PA system from prior right lower lobectomy.  - Continue Eliquis.   With stable echo today, she can followup prn.   Loralie Champagne 07/14/2021

## 2021-07-14 NOTE — Progress Notes (Signed)
°  Echocardiogram 2D Echocardiogram has been performed.  Johny Chess 07/14/2021, 1:00 PM

## 2021-07-22 ENCOUNTER — Ambulatory Visit (HOSPITAL_COMMUNITY)
Admission: RE | Admit: 2021-07-22 | Discharge: 2021-07-22 | Disposition: A | Discharge status: Home / Self Care | Payer: Medicare Other | Source: Ambulatory Visit | Attending: Emergency Medicine | Admitting: Emergency Medicine

## 2021-07-22 ENCOUNTER — Other Ambulatory Visit: Payer: Self-pay

## 2021-07-22 DIAGNOSIS — R0789 Other chest pain: Secondary | ICD-10-CM | POA: Diagnosis not present

## 2021-07-22 LAB — POCT I-STAT CREATININE: Creatinine, Ser: 0.7 mg/dL (ref 0.44–1.00)

## 2021-07-22 MED ORDER — SODIUM CHLORIDE (PF) 0.9 % IJ SOLN
INTRAMUSCULAR | Status: AC
  Filled 2021-07-22: qty 50

## 2021-07-22 MED ORDER — IOHEXOL 350 MG/ML SOLN
100.0000 mL | Freq: Once | INTRAVENOUS | Status: AC | PRN
  Administered 2021-07-22: 100 mL via INTRAVENOUS

## 2021-07-25 NOTE — Patient Instructions (Signed)
Please take Norco 10/325 sparingly for musculoskeletal pain.  Have bone density study which has been scheduled for early April.  You may benefit from endocrinology consultation.  You have history of osteoporosis on bone density study done a number of years ago and this may have gotten worse.  We might need to consider bone sparing medication.

## 2021-07-26 ENCOUNTER — Encounter: Payer: Self-pay | Admitting: Emergency Medicine

## 2021-07-26 ENCOUNTER — Telehealth: Payer: Self-pay

## 2021-07-26 DIAGNOSIS — M810 Age-related osteoporosis without current pathological fracture: Secondary | ICD-10-CM

## 2021-07-26 NOTE — Telephone Encounter (Signed)
Referral to endo after Dexa ordered.

## 2021-07-27 NOTE — Telephone Encounter (Signed)
Please see other encounter. Will close this encounter.

## 2021-07-27 NOTE — Telephone Encounter (Signed)
Dr. Lamonte Sakai please see patient mychart message.   I am anxious to address the compression fractures I continue to experience in my spine. I dont have an appointment to discuss the scan until Feb 8. I would like advice on the next step I should take regarding this issue.  Regarding my previous message: Dr. Lauree Chandler office just contacted me. They are going to try to move up my next bone density scan and then refer me to an endocrinologist. Will see you on the 8th.

## 2021-07-28 ENCOUNTER — Encounter: Payer: Self-pay | Admitting: Internal Medicine

## 2021-07-28 ENCOUNTER — Other Ambulatory Visit: Payer: Self-pay

## 2021-07-28 ENCOUNTER — Ambulatory Visit (INDEPENDENT_AMBULATORY_CARE_PROVIDER_SITE_OTHER): Payer: Medicare Other | Admitting: Internal Medicine

## 2021-07-28 DIAGNOSIS — A310 Pulmonary mycobacterial infection: Secondary | ICD-10-CM | POA: Diagnosis not present

## 2021-07-28 MED ORDER — CLOFAZIMINE POWD: 100.0000 mg | Freq: Every morning | 11 refills | Status: DC

## 2021-07-28 MED ORDER — AZITHROMYCIN 500 MG PO TABS: 500.0000 mg | ORAL_TABLET | Freq: Every day | ORAL | 11 refills | Status: DC

## 2021-07-28 MED ORDER — ETHAMBUTOL HCL 400 MG PO TABS: 800.0000 mg | ORAL_TABLET | Freq: Every day | ORAL | 11 refills | Status: DC

## 2021-07-28 NOTE — Addendum Note (Signed)
Addended by: Leatrice Jewels on: 07/28/2021 11:26 AM   Modules accepted: Orders

## 2021-07-28 NOTE — Progress Notes (Signed)
[facility] for Infectious Disease  Patient Active Problem List   Diagnosis Date Noted   Mycobacterium avium infection ([place]) 02/11/2020    Priority: High   Hearing loss 11/04/2020    Priority: Medium    Intractable nausea and vomiting 02/20/2020    Priority: Medium    Weight loss, non-intentional 09/20/2019    Priority: Medium    Skin rash 02/28/2020   Hyponatremia    Dehydration with hyponatremia 02/20/2020   Low back pain 02/20/2020   Chronic diastolic CHF (congestive heart failure) ([place]) 02/20/2020   Essential hypertension 32/54/9826   Acute metabolic encephalopathy 41/58/3094   Right epiretinal membrane 01/30/2020   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   Nuclear sclerotic cataract of right eye 01/30/2020   Nuclear sclerotic cataract of left eye 01/30/2020   Iron deficiency anemia 01/20/2020   Malignant neoplasm of overlapping sites of right breast in female, estrogen receptor positive ([place]) 09/20/2019   Postprocedural pneumothorax    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   Port-A-Cath in place 04/26/2018   Genetic testing 02/20/2018   Family history of breast cancer    Family history of lung cancer    Personal history of lung cancer    Chest pain 11/28/2017   Abnormal CT of the chest 11/30/2016   Malignant neoplasm of upper-inner quadrant of left breast in female, estrogen receptor positive ([place]) 06/16/2014   Osteopenia 09/13/2013   Postmenopausal atrophic vaginitis 09/13/2013   Vitamin D deficiency 09/13/2013   Malignant neoplasm of lower lobe of right lung ([place]) 07/24/2012   Anxiety 03/11/2011   History of neutropenia 03/11/2011   Family history of colon cancer 03/11/2011   Bronchiectasis without complication ([place]) 07/68/0881    Patient's Medications  New Prescriptions   No medications on file  Previous Medications   ACYCLOVIR (ZOVIRAX) 400 MG TABLET    TAKE 1 TABLET BY MOUTH TWICE DAILY   ALBUTEROL (VENTOLIN HFA) 108 (90 BASE) MCG/ACT INHALER    INHALE 2  PUFFS INTO LUNGS EVERY 6 HOURS AS NEEDED FOR WHEEZING OR SHORTNESS OF BREATH.   APIXABAN (ELIQUIS) 5 MG TABS TABLET    Take 1 tablet (5 mg total) by mouth 2 (two) times daily.   HYDROCODONE-ACETAMINOPHEN (NORCO) 10-325 MG TABLET    One half to one tab with food every 8 hours  as needed sparingly for pain. Note this is a stronger dose than before   LETROZOLE (FEMARA) 2.5 MG TABLET    Take 2.5 mg by mouth daily.   MULTIPLE VITAMINS-MINERALS (ONE-A-DAY WOMENS PO)    Take by mouth.   PROMETHAZINE (PHENERGAN) 12.5 MG TABLET    Take 1 tablet (12.5 mg total) by mouth every 8 (eight) hours as needed.   ROSUVASTATIN (CRESTOR) 10 MG TABLET    Take 1 tablet (10 mg total) by mouth daily.  Modified Medications   Modified Medication Previous Medication   AZITHROMYCIN (ZITHROMAX) 500 MG TABLET azithromycin (ZITHROMAX) 500 MG tablet      Take 1 tablet (500 mg total) by mouth daily.    Take 500 mg by mouth daily.   CLOFAZIMINE POWD Clofazimine POWD      100 mg by Does not apply route every morning.    100 mg by Does not apply route every morning.   ETHAMBUTOL (MYAMBUTOL) 400 MG TABLET ethambutol (MYAMBUTOL) 400 MG tablet      Take 2 tablets (800 mg total) by mouth daily.    Take 2 tablets (800 mg total) by mouth daily.  Discontinued Medications   No medications on file    Subjective: Anna Valentine is in for her routine follow-up visit.  She started on azithromycin, clofazimine and ethambutol in January of last year for her smoldering Mycobacterium avium pneumonia.  She had not tolerated a rifampin based regimen in 2021.  She feels like she is tolerating her medications well now.  She continues to have some mild, intermittent diarrhea.  Her cough has improved and she now has only occasional cough.  She used to bring up a small amount of brown sputum each morning but now it is clear.  She has had no change in her chronic dyspnea on exertion.  Her appetite is good. She Eliquis after the chest CT scan in late September  showed a blood clot.  She was also found to have a T7 vertebral fracture last September.  She recently developed recurrent, acute back pain and underwent a repeat scan last week which showed new T6 and T12 compression fractures.  She is scheduled for bone density testing.  Recent CT scan did not show any acute changes in her right upper lobe to suggest progression of her pneumonia.  Review of Systems: Review of Systems  Constitutional:  Negative for chills, diaphoresis, fever and weight loss.  Respiratory:  Positive for cough, sputum production and shortness of breath. Negative for hemoptysis and wheezing.   Cardiovascular:  Negative for chest pain.  Gastrointestinal:  Positive for diarrhea. Negative for abdominal pain, nausea and vomiting.  Musculoskeletal:  Positive for back pain.   Past Medical History:  Diagnosis Date   Anemia    hx of IDA   BRCA negative 2011   BRCA I/ II negative   Breast cancer ([place]) 08/2009   LEFT=stage 2, rx with lumpectomy and xrt   Breast cancer ([place]) 2019   RIGHT   Bronchiectasis ([place])    Chronic diastolic CHF (congestive heart failure) ([place]) 02/20/2020   Constipation    Essential hypertension 02/20/2020   Family history of adverse reaction to anesthesia    My Sister has nausea   Family history of breast cancer    Family history of colon cancer    Family history of lung cancer    GERD (gastroesophageal reflux disease)    not presently having symptoms   Hypertension    Iatrogenic pneumothorax 02/06/2019   Lung cancer ([place]) 06/06/05   stage 1 poorly differentiated adenocarcinoma, s/p right lower lobectomy.   Mycobacterium avium complex ([place])    dx 2021   Osteopenia 09/13/2013   Osteoporosis    Personal history of lung cancer    Pneumonia    2007ish, walking pnemonia - 2009 ish   STD (sexually transmitted disease)    HSV   Vitamin D deficiency 09/13/2013    Social History   Tobacco Use   Smoking status: Former    Packs/day: 2.00    Years: 18.00     Pack years: 36.00    Types: Cigarettes    Quit date: 07/12/1987    Years since quitting: 34.0   Smokeless tobacco: Never  Vaping Use   Vaping Use: Never used  Substance Use Topics   Alcohol use: Yes    Comment: 1 time per month   Drug use: No    Family History  Problem Relation Age of Onset   Allergies Mother    Asthma Mother    Lung cancer Mother    Breast cancer Mother 56       recurrence age 41  Colon cancer Father 49   Colon polyps Father 35   Prostate cancer Brother 42   Breast cancer Sister 3       Recurrence age 48 BRCA negative   Colon polyps Sister    Leukemia Sister    Breast cancer Sister 82   Prostate cancer Brother 70   Breast cancer Maternal Grandmother 65   Colon cancer Maternal Aunt    Leukemia Maternal Grandfather    Lung cancer Maternal Aunt    Breast cancer Cousin    Esophageal cancer Neg Hx    Rectal cancer Neg Hx    Stomach cancer Neg Hx     Allergies  Allergen Reactions   Rifampin Nausea And Vomiting   Taxol [Paclitaxel] Anaphylaxis   Paclitaxel (Protein-Bound) Hives   Tape Itching   Abraxane [Paclitaxel Protein-Bound Part] Rash   Clarithromycin Rash    Objective: Vitals:   07/28/21 1022  BP: 126/84  Pulse: 91  Temp: (!) 97.5 F (36.4 C)  TempSrc: Oral  SpO2: 100%  Weight: 110 lb (49.9 kg)   Body mass index is 17.75 kg/m.  Physical Exam Constitutional:      Comments: She is in good spirits as usual.  Cardiovascular:     Rate and Rhythm: Normal rate and regular rhythm.     Heart sounds: No murmur heard. Pulmonary:     Effort: Pulmonary effort is normal.     Breath sounds: No wheezing, rhonchi or rales.  Psychiatric:        Mood and Affect: Mood normal.    Lab Results Sputum AFB smear 06/16/2021: 3+ AFB Sputum AFB culture 06/16/2021: Mycobacterium avium  Problem List Items Addressed This Visit       High   Mycobacterium avium infection ([place])    Although her AFB smears and cultures remain positive for  Mycobacterium avium she has had definite, sustained improvement since restarting antibiotic therapy 1 year ago.  She is tolerating her current 3 drug regimen well.  She will continue it and follow-up in 3 months.      Relevant Medications   Clofazimine POWD   ethambutol (MYAMBUTOL) 400 MG tablet   azithromycin (ZITHROMAX) 500 MG tablet    Michel Bickers, MD [facility] [phone] pager   [phone] cell 07/28/2021, 11:08 AM

## 2021-07-28 NOTE — Assessment & Plan Note (Signed)
Although her AFB smears and cultures remain positive for Mycobacterium avium she has had definite, sustained improvement since restarting antibiotic therapy 1 year ago.  She is tolerating her current 3 drug regimen well.  She will continue it and follow-up in 3 months.

## 2021-08-02 ENCOUNTER — Other Ambulatory Visit: Payer: Self-pay

## 2021-08-02 ENCOUNTER — Ambulatory Visit (HOSPITAL_BASED_OUTPATIENT_CLINIC_OR_DEPARTMENT_OTHER)
Admission: RE | Admit: 2021-08-02 | Discharge: 2021-08-02 | Disposition: A | Discharge status: Home / Self Care | Payer: Medicare Other | Source: Ambulatory Visit | Attending: Internal Medicine | Admitting: Internal Medicine

## 2021-08-02 DIAGNOSIS — Z780 Asymptomatic menopausal state: Secondary | ICD-10-CM | POA: Diagnosis not present

## 2021-08-02 DIAGNOSIS — M810 Age-related osteoporosis without current pathological fracture: Secondary | ICD-10-CM | POA: Diagnosis not present

## 2021-08-04 DIAGNOSIS — L853 Xerosis cutis: Secondary | ICD-10-CM | POA: Diagnosis not present

## 2021-08-04 DIAGNOSIS — D2272 Melanocytic nevi of left lower limb, including hip: Secondary | ICD-10-CM | POA: Diagnosis not present

## 2021-08-04 DIAGNOSIS — L821 Other seborrheic keratosis: Secondary | ICD-10-CM | POA: Diagnosis not present

## 2021-08-04 DIAGNOSIS — D2271 Melanocytic nevi of right lower limb, including hip: Secondary | ICD-10-CM | POA: Diagnosis not present

## 2021-08-04 DIAGNOSIS — L720 Epidermal cyst: Secondary | ICD-10-CM | POA: Diagnosis not present

## 2021-08-04 DIAGNOSIS — D1801 Hemangioma of skin and subcutaneous tissue: Secondary | ICD-10-CM | POA: Diagnosis not present

## 2021-08-04 DIAGNOSIS — L814 Other melanin hyperpigmentation: Secondary | ICD-10-CM | POA: Diagnosis not present

## 2021-08-04 DIAGNOSIS — D225 Melanocytic nevi of trunk: Secondary | ICD-10-CM | POA: Diagnosis not present

## 2021-08-11 ENCOUNTER — Telehealth: Payer: Self-pay

## 2021-08-11 NOTE — Telephone Encounter (Signed)
Anna Valentine with [facility] called regarding patient's sputum culture. Please see results.

## 2021-08-12 ENCOUNTER — Telehealth: Payer: Self-pay

## 2021-08-12 ENCOUNTER — Other Ambulatory Visit: Payer: Self-pay

## 2021-08-12 ENCOUNTER — Encounter: Payer: Self-pay | Admitting: Internal Medicine

## 2021-08-12 ENCOUNTER — Ambulatory Visit (INDEPENDENT_AMBULATORY_CARE_PROVIDER_SITE_OTHER): Payer: Medicare Other | Admitting: Internal Medicine

## 2021-08-12 ENCOUNTER — Ambulatory Visit
Admission: RE | Admit: 2021-08-12 | Discharge: 2021-08-12 | Disposition: A | Discharge status: Home / Self Care | Payer: Medicare Other | Source: Ambulatory Visit | Attending: Internal Medicine | Admitting: Internal Medicine

## 2021-08-12 VITALS — BP 110/70 | HR 87 | Temp 99.6°F

## 2021-08-12 DIAGNOSIS — Z853 Personal history of malignant neoplasm of breast: Secondary | ICD-10-CM | POA: Diagnosis not present

## 2021-08-12 DIAGNOSIS — R0989 Other specified symptoms and signs involving the circulatory and respiratory systems: Secondary | ICD-10-CM | POA: Diagnosis not present

## 2021-08-12 DIAGNOSIS — Z85118 Personal history of other malignant neoplasm of bronchus and lung: Secondary | ICD-10-CM | POA: Diagnosis not present

## 2021-08-12 DIAGNOSIS — J22 Unspecified acute lower respiratory infection: Secondary | ICD-10-CM

## 2021-08-12 DIAGNOSIS — Z8619 Personal history of other infectious and parasitic diseases: Secondary | ICD-10-CM

## 2021-08-12 DIAGNOSIS — R059 Cough, unspecified: Secondary | ICD-10-CM | POA: Diagnosis not present

## 2021-08-12 MED ORDER — HYDROCOD POLI-CHLORPHE POLI ER 10-8 MG/5ML PO SUER: 5.0000 mL | Freq: Two times a day (BID) | ORAL | 0 refills | Status: DC | PRN

## 2021-08-12 MED ORDER — CEFTRIAXONE SODIUM 1 G IJ SOLR
1.0000 g | Freq: Once | INTRAMUSCULAR | Status: AC
  Administered 2021-08-12: 1 g via INTRAMUSCULAR

## 2021-08-12 NOTE — Patient Instructions (Addendum)
One gram M Rocephin given. Take Tussionex sparingly for cough. Rest and drink fluids. Call if symptoms worsen.  Continue azithromycin as previously prescribed daily by Dr. Megan Salon.  Follow-up with Dr. Lamonte Sakai soon.  Chest x-ray does not show pneumonia.

## 2021-08-12 NOTE — Telephone Encounter (Signed)
Scheduled

## 2021-08-12 NOTE — Telephone Encounter (Signed)
Patient states that she would like to be seen. She has a runny nose and cough. Her covid test was negative but she would like to have her lungs listened to to make sure its not pneumonia.

## 2021-08-16 ENCOUNTER — Telehealth: Payer: Self-pay | Admitting: Pharmacist

## 2021-08-16 NOTE — Telephone Encounter (Signed)
Requested refill for clofazimine from [facility]. Medication should arrive to clinic in 7-10 business days. Will update encounter and patient when medication arrives.   Tessica Cupo L. Shacara Cozine, PharmD [facility] Clinical Pharmacist Practitioner

## 2021-08-18 ENCOUNTER — Other Ambulatory Visit: Payer: Self-pay

## 2021-08-18 ENCOUNTER — Ambulatory Visit (INDEPENDENT_AMBULATORY_CARE_PROVIDER_SITE_OTHER): Payer: Medicare Other | Admitting: Emergency Medicine

## 2021-08-18 ENCOUNTER — Encounter: Payer: Self-pay | Admitting: Emergency Medicine

## 2021-08-18 DIAGNOSIS — A310 Pulmonary mycobacterial infection: Secondary | ICD-10-CM | POA: Diagnosis not present

## 2021-08-18 DIAGNOSIS — C3431 Malignant neoplasm of lower lobe, right bronchus or lung: Secondary | ICD-10-CM

## 2021-08-18 DIAGNOSIS — J479 Bronchiectasis, uncomplicated: Secondary | ICD-10-CM

## 2021-08-18 DIAGNOSIS — R9389 Abnormal findings on diagnostic imaging of other specified body structures: Secondary | ICD-10-CM

## 2021-08-18 NOTE — Progress Notes (Signed)
Subjective:    Patient ID: Anna Valentine, female    DOB: 06-20-1951, 71 y.o.   MRN: [id number]  HPI  ROV 07/07/21 --71 year old woman with complicated history that includes right lower lobe non-small cell lung cancer post lobectomy, mycobacterial infection with significant impact on the rest of her right lung with obstruction and distortion.  Also with a history of breast cancer.  We have been following serial imaging on antibiotic therapy for MAIC, azithromycin and ethambutol.  I started her on Eliquis for clot noted in her right lower lobe PA stump starting in November. She reports that she has been having intermittent R back pain that seems to be MSK, after a strain carrying something. She has a compression fracture, ? Related. Started in September. Then an episode of chest and back pain in December, better after 2 days. Resolved w hydrocodone. No increase in cough. Expect that she will be on abx for a years after the sputum is negative.   AFB 12/7 >> AFB still positive.    ROV 08/18/21 --follow-up visit for 71 year old woman with history of right lower lobe non-small cell lung cancer post lobectomy, superimposed mycobacterial infection with some destruction of the remaining right lung.  Also with history of breast cancer.  We discovered a clot in her right lower lobe pulmonary artery stump and treated her with anticoagulation for 3 months - stopped in mid-January.  She remains on azithromycin and ethambutol.  Surveillance cultures were still positive for MAC 06/16/2021. She reports that she has had an upper respiratory infection beginning about 1 week ago.  Increased cough, increased dyspnea.  She is making sputum, clear.  No hemoptysis.  She was treated with ceftriaxone x1, Tussionex.  Still having trouble.  CT-PA 07/22/2021 reviewed by me showed decreased volume of thrombus in the right lower lobe pulmonary artery consistent with chronic PE but decreased.  Stable postsurgical changes and  inflammatory changes in the right lung without any progression.  Thoracic vertebral body compression fractures noted   Review of Systems  Constitutional:  Positive for activity change and fatigue. Negative for fever and unexpected weight change.  HENT: Negative.  Negative for congestion, dental problem, ear pain, nosebleeds, postnasal drip, rhinorrhea, sinus pressure, sneezing, sore throat and trouble swallowing.   Eyes: Negative.  Negative for redness and itching.  Respiratory: Negative.  Negative for cough, chest tightness, shortness of breath and wheezing.   Cardiovascular: Negative.  Negative for palpitations and leg swelling.  Gastrointestinal: Negative.  Negative for nausea and vomiting.  Genitourinary: Negative.  Negative for dysuria.  Musculoskeletal:  Positive for back pain. Negative for joint swelling.  Skin: Negative.  Negative for rash.  Neurological: Negative.  Negative for headaches.  Hematological: Negative.  Does not bruise/bleed easily.  Psychiatric/Behavioral: Negative.  Negative for dysphoric mood. The patient is not nervous/anxious.        Objective:   Physical Exam Vitals:   08/18/21 1009  BP: 118/68  Pulse: 94  Temp: 98.4 F (36.9 C)  TempSrc: Oral  SpO2: 98%  Weight: 109 lb 12.8 oz (49.8 kg)  Height: _0  (1.676 m)   Gen: stronger, thin woman, well-nourished, in no distress  ENT: No lesions,  mouth clear,  oropharynx clear, no postnasal drip  Neck: No JVD, no stridor  Lungs: No use of accessory muscles, coarse R lung with some inspiratory and expiratory rhonchi. L is clear  Cardiovascular: RRR, heart sounds normal, no murmur or gallops, no peripheral edema  Musculoskeletal: No deformities, no  cyanosis or clubbing  Neuro: alert, non focal  Skin: Warm, no lesions or rash     Assessment & Plan:  Bronchiectasis without complication (HCC) Flaring bronchiectasis symptoms since she had a URI about 1 week ago.  Discussed possible prednisone taper  with her.  She would like to avoid because she gets significant side effects including insomnia.  She will call me and let me know if the symptoms are clearing.  If not then I think a prednisone taper is appropriate.  She is already on azithromycin and has received ceftriaxone x1.  I think we can hold off on any other antibiotics.  Malignant neoplasm of lower lobe of right lung [facility]) Following serial imaging, next in July 2023  Mycobacterium avium infection ([place]) Severe Mycobacterium avium infection involving the right lung, some of which is destroyed/necrotic.  She has some nodular changes in the left lung that are stable, no progression.  Suspect she will have to be on azithromycin and ethambutol long-term as her cultures have not cleared.  She is following with Dr. Megan Salon.  Abnormal CT of the chest She had a thrombus in her right lower lobe pulmonary arterial stump.  I treated her with Eliquis for 3 months, stopped in mid January.  She had decreased clot burden in that vessel on a repeat CT chest 07/22/2021.  I do not think we need to continue the Eliquis long-term.   Baltazar Apo, MD, PhD 08/18/2021, 10:28 AM [facility] [phone] or if no answer [phone]

## 2021-08-18 NOTE — Patient Instructions (Addendum)
Agree with using Tussionex as ordered by Dr. Renold Genta Please call our office if your cough, shortness of breath are not resolving in the next week.  If so I think you would benefit from a short course of prednisone Keep your albuterol available to use 2 puffs when you needed for shortness of breath, chest tightness, wheezing. We will plan to repeat your CT scan of the chest in July 2023. Continue your azithromycin and ethambutol as you have been taking them.  Follow with Dr. Megan Salon as planned. Follow Dr. Lamonte Sakai in July after your CT scan so we can review the results together.

## 2021-08-18 NOTE — Addendum Note (Signed)
Addended by: Gavin Potters R on: 08/18/2021 10:47 AM   Modules accepted: Orders

## 2021-08-18 NOTE — Assessment & Plan Note (Signed)
Following serial imaging, next in July 2023

## 2021-08-18 NOTE — Assessment & Plan Note (Signed)
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  She has some nodular changes in the left lung that are stable, no progression.  Suspect she will have to be on azithromycin and ethambutol long-term as her cultures have not cleared.  She is following with Dr. Megan Salon.

## 2021-08-18 NOTE — Assessment & Plan Note (Signed)
Flaring bronchiectasis symptoms since she had a URI about 1 week ago.  Discussed possible prednisone taper with her.  She would like to avoid because she gets significant side effects including insomnia.  She will call me and let me know if the symptoms are clearing.  If not then I think a prednisone taper is appropriate.  She is already on azithromycin and has received ceftriaxone x1.  I think we can hold off on any other antibiotics.

## 2021-08-18 NOTE — Assessment & Plan Note (Signed)
She had a thrombus in her right lower lobe pulmonary arterial stump.  I treated her with Eliquis for 3 months, stopped in mid January.  She had decreased clot burden in that vessel on a repeat CT chest 07/22/2021.  I do not think we need to continue the Eliquis long-term.

## 2021-08-21 DIAGNOSIS — Z20822 Contact with and (suspected) exposure to covid-19: Secondary | ICD-10-CM | POA: Diagnosis not present

## 2021-08-23 LAB — M. AVIUM MIC PANEL
AMIKACIN: 32
CIPROFLOXACIN: >8
CLARITHROMYCIN: 4
LINEZOLID: 32
MOXIFLOXACIN: 4
RIFABUTIN: 0.25
RIFAMPIN: >4
STREPTOMYCIN: >32

## 2021-08-23 LAB — CP MYCOBACTERIAL IDENTIFICATION

## 2021-08-23 LAB — MYCOBACTERIA,CULT W/FLUOROCHROME SMEAR
MICRO NUMBER:: 12729090
SPECIMEN QUALITY:: ADEQUATE

## 2021-08-23 NOTE — Telephone Encounter (Signed)
2 bottles of clofazimine are located in the pharmacy office when patient needs a refill.  Luceal Hollibaugh L. Jeb Schloemer, PharmD [facility] Clinical Pharmacist Practitioner

## 2021-08-25 ENCOUNTER — Ambulatory Visit (INDEPENDENT_AMBULATORY_CARE_PROVIDER_SITE_OTHER): Payer: Medicare Other | Admitting: Internal Medicine

## 2021-08-25 ENCOUNTER — Encounter: Payer: Self-pay | Admitting: Internal Medicine

## 2021-08-25 ENCOUNTER — Other Ambulatory Visit: Payer: Self-pay

## 2021-08-25 VITALS — BP 104/72 | HR 100 | Ht 66.0 in | Wt 109.0 lb

## 2021-08-25 DIAGNOSIS — M816 Localized osteoporosis [Lequesne]: Secondary | ICD-10-CM | POA: Diagnosis not present

## 2021-08-25 DIAGNOSIS — M8080XA Other osteoporosis with current pathological fracture, unspecified site, initial encounter for fracture: Secondary | ICD-10-CM | POA: Insufficient documentation

## 2021-08-25 DIAGNOSIS — M8080XS Other osteoporosis with current pathological fracture, unspecified site, sequela: Secondary | ICD-10-CM | POA: Diagnosis not present

## 2021-08-25 DIAGNOSIS — M810 Age-related osteoporosis without current pathological fracture: Secondary | ICD-10-CM | POA: Diagnosis not present

## 2021-08-25 LAB — VITAMIN D 25 HYDROXY (VIT D DEFICIENCY, FRACTURES): VITD: 25.71 ng/mL — ABNORMAL LOW (ref 30.00–100.00)

## 2021-08-25 NOTE — Progress Notes (Signed)
Name: Anna Valentine  MRN/ DOB: [id number], [birth year]    Age/ Sex: 71 y.o., female    PCP: [name], MD   Reason for Endocrinology Evaluation: Osteoporosis      Date of Initial Endocrinology Evaluation: 08/25/2021     HPI: [name] NEEDS is a 71 y.o. female with a past medical history of dyslipidemia, bronchiectasis , Hx of PE and lung ca( in 2006) . Hx of Breast Ca   . The patient presented for initial endocrinology clinic visit on 08/25/2021 for consultative assistance with her Osteoporosis .   Pt was diagnosed with osteoporosis: 2013 with a left total hip T-score of -2.5 . Repeat in 07/2021 - 4.0   Menarche at age : 55 Menopausal at age : 89 Fracture Hx: right finger fracture while tripping  Hx of HRT: no FH of osteoporosis or hip fracture: no Prior Hx of anti-estrogenic therapy : yes Prior Hx of anti-resorptive therapy : alendronate around 2010 . Had to be stopped due to  GERD  Has TP 3 mutation   She is S/P mastectomy , chemo and radiation for breast Ca   She was on calcium and Vitamin D until a year ago when she was advised to stop while on Abx for MAC  No recent GERD  Has occasional back pain which has improved  Has occasional diarrhea    HISTORY:  Past Medical History:  Past Medical History:  Diagnosis Date   Anemia    hx of IDA   BRCA negative 2011   BRCA I/ II negative   Breast cancer ([place]) 08/2009   LEFT=stage 2, rx with lumpectomy and xrt   Breast cancer ([place]) 2019   RIGHT   Bronchiectasis ([place])    Chronic diastolic CHF (congestive heart failure) ([place]) 02/20/2020   Constipation    Essential hypertension 02/20/2020   Family history of adverse reaction to anesthesia    My Sister has nausea   Family history of breast cancer    Family history of colon cancer    Family history of lung cancer    GERD (gastroesophageal reflux disease)    not presently having symptoms   Hypertension    Iatrogenic pneumothorax 02/06/2019   Lung cancer ([place])  06/06/05   stage 1 poorly differentiated adenocarcinoma, s/p right lower lobectomy.   Mycobacterium avium complex ([place])    dx 2021   Osteopenia 09/13/2013   Osteoporosis    Personal history of lung cancer    Pneumonia    2007ish, walking pnemonia - 2009 ish   STD (sexually transmitted disease)    HSV   Vitamin D deficiency 09/13/2013   Past Surgical History:  Past Surgical History:  Procedure Laterality Date   BREAST BIOPSY     BREAST LUMPECTOMY  10/12/2009   Left lumpectomy and radiation, stage II, ER/PR+, Her 2 nu negative   BUNIONECTOMY Bilateral    COLONOSCOPY  2018   SA-MAC-suprep-no polyps   COLONOSCOPY     LOBECTOMY Right 06/06/2005   [place]   MASTECTOMY W/ SENTINEL NODE BIOPSY Bilateral 03/15/2018   MASTECTOMY W/ SENTINEL NODE BIOPSY Bilateral 03/15/2018   Procedure: BILATERAL TOTAL MASTECTOMIES WITH RIGHT SENTINEL LYMPH NODE BIOPSY;  Surgeon: Rolm Bookbinder, MD;  Location: [facility];  Service: General;  Laterality: Bilateral;   PORTACATH PLACEMENT N/A 03/15/2018   Procedure: INSERTION PORT-A-CATH WITH [place];  Surgeon: Rolm Bookbinder, MD;  Location: [facility];  Service: General;  Laterality: N/A;   TONSILLECTOMY  TUBAL LIGATION  1984   VIDEO BRONCHOSCOPY  02/06/2019   Flexible video fiberoptic bronchoscopy with electromagnetic navigation and biopsies.   VIDEO BRONCHOSCOPY WITH ENDOBRONCHIAL NAVIGATION Left 02/06/2019   Procedure: VIDEO BRONCHOSCOPY WITH ENDOBRONCHIAL NAVIGATION, left lung;  Surgeon: Collene Gobble, MD;  [facility];  Service: Thoracic;  Laterality: Left;   VIDEO BRONCHOSCOPY WITH ENDOBRONCHIAL NAVIGATION N/A 01/08/2020   Procedure: VIDEO BRONCHOSCOPY WITH ENDOBRONCHIAL NAVIGATION;  Surgeon: Collene Gobble, MD;  Location: [facility];  Service: Thoracic;  Laterality: N/A;    Social History:  reports that she quit smoking about 34 years ago. Her smoking use included cigarettes. She has a 36.00 pack-year smoking history. She has never used smokeless tobacco. She  reports current alcohol use. She reports that she does not use drugs. Family History: family history includes Allergies in her mother; Asthma in her mother; Breast cancer in her cousin; Breast cancer (age of onset: 54) in her sister; Breast cancer (age of onset: 78) in her sister; Breast cancer (age of onset: 81) in her mother; Breast cancer (age of onset: 40) in her maternal grandmother; Colon cancer in her maternal aunt; Colon cancer (age of onset: 72) in her father; Colon polyps in her sister; Colon polyps (age of onset: 36) in her father; Leukemia in her maternal grandfather and sister; Lung cancer in her maternal aunt and mother; Prostate cancer (age of onset: 14) in her brother; Prostate cancer (age of onset: 5) in her brother.   HOME MEDICATIONS: Allergies as of 08/25/2021       Reactions   Rifampin Nausea And Vomiting   Taxol [paclitaxel] Anaphylaxis   Paclitaxel (protein-bound) Hives   Tape Itching   Abraxane [paclitaxel Protein-bound Part] Rash   Clarithromycin Rash        Medication List        Accurate as of August 25, 2021  1:46 PM. If you have any questions, ask your nurse or doctor.          acyclovir 400 MG tablet Commonly known as: ZOVIRAX TAKE 1 TABLET BY MOUTH TWICE DAILY   albuterol 108 (90 Base) MCG/ACT inhaler Commonly known as: VENTOLIN HFA INHALE 2 PUFFS INTO LUNGS EVERY 6 HOURS AS NEEDED FOR WHEEZING OR SHORTNESS OF BREATH.   azithromycin 500 MG tablet Commonly known as: ZITHROMAX Take 1 tablet (500 mg total) by mouth daily.   chlorpheniramine-HYDROcodone 10-8 MG/5ML Commonly known as: Tussionex Pennkinetic ER Take 5 mLs by mouth every 12 (twelve) hours as needed for cough.   Clofazimine Powd 100 mg by Does not apply route every morning.   ethambutol 400 MG tablet Commonly known as: MYAMBUTOL Take 2 tablets (800 mg total) by mouth daily.   HYDROcodone-acetaminophen 10-325 MG tablet Commonly known as: NORCO One half to one tab with food  every 8 hours  as needed sparingly for pain. Note this is a stronger dose than before   letrozole 2.5 MG tablet Commonly known as: FEMARA Take 2.5 mg by mouth daily.   ONE-A-DAY WOMENS PO Take by mouth.   promethazine 12.5 MG tablet Commonly known as: PHENERGAN Take 1 tablet (12.5 mg total) by mouth every 8 (eight) hours as needed.   rosuvastatin 10 MG tablet Commonly known as: Crestor Take 1 tablet (10 mg total) by mouth daily.          REVIEW OF SYSTEMS: A comprehensive ROS was conducted with the patient and is negative except as per HPI and below:  ROS     OBJECTIVE:  VS: BP 104/72 (BP Location: Left Arm, Patient Position: Sitting, Cuff Size: Small)    Pulse 100    Ht _0  (1.676 m)    Wt 109 lb (49.4 kg)    SpO2 97%    BMI 17.59 kg/m    Wt Readings from Last 3 Encounters:  08/25/21 109 lb (49.4 kg)  08/18/21 109 lb 12.8 oz (49.8 kg)  07/28/21 110 lb (49.9 kg)     EXAM: General: Pt appears well and is in NAD  Neck: General: Supple without adenopathy. Thyroid: Thyroid size normal.  No goiter or nodules appreciated. No thyroid bruit.  Lungs: Clear with good BS bilat with no rales, rhonchi, or wheezes  Heart: Auscultation: RRR.  Abdomen: Normoactive bowel sounds, soft, nontender, without masses or organomegaly palpable  Extremities:  BL LE: No pretibial edema normal ROM and strength.  Mental Status: Judgment, insight: Intact Orientation: Oriented to time, place, and person Mood and affect: No depression, anxiety, or agitation     DATA REVIEWED:    Thoracic X-ray   Interval development of mild wedge compression deformity of midthoracic vertebral body concerning for fracture of indeterminate age. MRI may be performed for further evaluation.   MRI thoracic 04/04/2021    Late subacute compression fracture involving the T7 vertebral body with up to 60% anterior height loss without bony retropulsion. Although new as compared to prior CT from 10/02/2020,  appearance of this fracture is felt to be older than a fracture related to patient's recent injury 3 weeks ago. This is benign/mechanical in appearance with no evidence for metastatic disease within the thoracic spine. 2. Small disc protrusions at T6-7 through T8-9 as above without significant stenosis. 3. Postsurgical changes with associated volume loss within the right lung, better evaluated on prior CT. ASSESSMENT/PLAN/RECOMMENDATIONS:   Osteoporosis :  - Pt with severe osteoporosis already with compression fractures of the thoracic spine - Under different circumstances she would be a great candidate for anabolic agents but given hx of Breast ca, Lung Ca, exposure to radiation and history of pulmonary embolism I am not comfortable prescribing romosozumab nor PTH analogs -After discussing the rest of the antiresorptive options as well as her chronic and extensive medical history we have opted to start Prolia -We discussed local reaction as well as hypocalcemia as a side effect -I have emphasized the importance of optimal calcium and vitamin D intake -Patient has 2 more years of letrozole, she understands that this is one of the contributing factors to worsening osteoporosis but will need to continue as the benefit outweighs the risk -We will proceed with testing for secondary causes: Vitamin D low we will replenish, PTH, GFR and magnesium are normal.  Phosphorus slightly elevated will monitor -We will also proceed with 24-hour urine for calcium and cortisol excretion  Medications : Calcium 1200 mg daily Vitamin D3 2000 IU daily Prolia 60 mg SQ every 6 months     Signed electronically by: Mack Guise, MD  [facility]  [facility] [address] [place], [place] [address] Phone: [phone] FAX: [phone]   CC: [name], MD [address] [id number] Phone: [phone] Fax: [phone]   Return  to Endocrinology clinic as below: Future Appointments  Date Time Provider [place]  09/13/2021 11:00 AM [facility] None  10/19/2021 11:15 AM [facility] None  10/19/2021 11:45 AM Nicholas Lose, MD [facility] None  10/20/2021 10:30 AM Michel Bickers, MD [facility]  03/10/2022  10:30 AM [place], Clent Demark, MD [facility] None

## 2021-08-25 NOTE — Patient Instructions (Addendum)
Calcium 1000-1200 mg daily    24-Hour Urine Collection  You will be collecting your urine for a 24-hour period of time. Your timer starts with your first urine of the morning (For example - If you first pee at [place], your timer will start at [place]) [place] away your first urine of the morning Collect your urine every time you pee for the next 24 hours STOP your urine collection 24 hours after you started the collection (For example - You would stop at 9AM the day after you started)

## 2021-08-26 ENCOUNTER — Telehealth: Payer: Self-pay | Admitting: Internal Medicine

## 2021-08-26 LAB — COMPREHENSIVE METABOLIC PANEL
ALT: 19 U/L (ref 0–35)
AST: 26 U/L (ref 0–37)
Albumin: 4 g/dL (ref 3.5–5.2)
Alkaline Phosphatase: 97 U/L (ref 39–117)
BUN: 13 mg/dL (ref 6–23)
CO2: 28 mEq/L (ref 19–32)
Calcium: 9.4 mg/dL (ref 8.4–10.5)
Chloride: 101 mEq/L (ref 96–112)
Creatinine, Ser: 0.9 mg/dL (ref 0.40–1.20)
GFR: 65.01 mL/min (ref >60.00)
Glucose, Bld: 95 mg/dL (ref 70–99)
Potassium: 4.4 mEq/L (ref 3.5–5.1)
Sodium: 136 mEq/L (ref 135–145)
Total Bilirubin: 0.4 mg/dL (ref 0.2–1.2)
Total Protein: 7.6 g/dL (ref 6.0–8.3)

## 2021-08-26 LAB — PHOSPHORUS: Phosphorus: 4.7 mg/dL — ABNORMAL HIGH (ref 2.3–4.6)

## 2021-08-26 LAB — PARATHYROID HORMONE, INTACT (NO CA): PTH: 55 pg/mL (ref 16–77)

## 2021-08-26 LAB — MAGNESIUM: Magnesium: 2 mg/dL (ref 1.5–2.5)

## 2021-08-26 NOTE — Telephone Encounter (Signed)
Brandy,   Can you please add her to the Prolia list ASAP?   Thanks a lot

## 2021-08-30 NOTE — Telephone Encounter (Signed)
Prolia VOB initiated via [email or web address]  [place]

## 2021-09-04 NOTE — Progress Notes (Signed)
° °  Subjective:    Patient ID: Anna Valentine, female    DOB: 1950/09/19, 71 y.o.   MRN: [id number]  HPI 71 year old Female with complex past medical history including Mycobacterium avium infection followed in infectious disease clinic presents with cough and runny nose.  Took home COVID test which was negative.  She requested appointment to rule out pneumonia.  She went to church recently.  Perhaps contracted viral respiratory infection they are.  Otherwise she stays at home and wears a mask always.  She also is followed by Dr. Lamonte Sakai, pulmonologist.  History of thoracic back pain which was aggravated in the early fall while lifting a heavy suitcase.  Found to have compression deformity involving T7 vertebral body with 60% anterior height loss.  She has been referred to endocrinologist regarding osteoporosis treatment.  Bone density study done January 2023 showed T score -3.5 in the AP spine and -4.0 in the left femur.  History of breast cancer in the remote past.  History of non-small cell lung cancer in 2006.  Pathology showed 1.2 cm poorly differentiated adenocarcinoma.  Has been followed at [facility] and also by Dr. Jana Hakim.  She tolerates azithromycin.  In 2020 Dr. Lamonte Sakai discovered bronchiectasis and infection with Mycobacterium AVM colonized with Pseudomonas treated with multiple antibiotics.  Subsequently developed a thick walled cavitary lesion in posterior right upper lobe July 2021.  PET scan showed hypermetabolism in the cavitary lesion as well as the right lung parenchyma.  Was started on 3 drug therapy for Mycobacterium AVM intracellulare including azithromycin rifampin and ethambutol which led to hyponatremia and volume depletion due to nausea.  In February 2011 was diagnosed with breast cancer and underwent left lumpectomy with diagnosis of lobular carcinoma.  Sentinel node biopsy showed 1 out of 4 lymph nodes positive for invasive lobular carcinoma and she was clinically stage II.  Tumor was ER  positive PR positive HER2/neu negative.  Underwent radiation therapy which was completed July 2011.  She underwent total bilateral mastectomies with right sentinel lymph node biopsy in September 2019.  She had right breast cancer triple positive at that time.  Genetic testing revealed positive p53 gene mutation.  Has been diagnosed with pulmonary hypertension and followed by cardiology.  History of iron deficiency and has received iron infusions.  Appetite has been affected by her chronic illnesses but she is eating better now.  Review of Systems denies fever or shaking chills.  She has a deep congested cough.     Objective:   Physical Exam Blood pressure 110/70 pulse 87 temperature 99.6 degrees pulse oximetry 96%.  She looks fatigued.  She is not tachypneic.  She is slightly pale.  TMs are clear.  Chest x-ray does not reveal pneumonia today.  However she looks ill.       Assessment & Plan:  Acute lower respiratory infection  Plan: Given 1 g IM Rocephin in the office.  Was advised to take Tussionex sparingly for cough.  Is seeing Dr. Lamonte Sakai on February 8.  She is already on azithromycin per Dr. Megan Salon chronically 500 mg daily.  She will call if symptoms worsen.

## 2021-09-06 DIAGNOSIS — Z20822 Contact with and (suspected) exposure to covid-19: Secondary | ICD-10-CM | POA: Diagnosis not present

## 2021-09-09 ENCOUNTER — Encounter (INDEPENDENT_AMBULATORY_CARE_PROVIDER_SITE_OTHER): Payer: Self-pay

## 2021-09-09 DIAGNOSIS — H35033 Hypertensive retinopathy, bilateral: Secondary | ICD-10-CM | POA: Diagnosis not present

## 2021-09-09 DIAGNOSIS — H35371 Puckering of macula, right eye: Secondary | ICD-10-CM | POA: Diagnosis not present

## 2021-09-09 DIAGNOSIS — Z79899 Other long term (current) drug therapy: Secondary | ICD-10-CM | POA: Diagnosis not present

## 2021-09-09 DIAGNOSIS — H40013 Open angle with borderline findings, low risk, bilateral: Secondary | ICD-10-CM | POA: Diagnosis not present

## 2021-09-13 ENCOUNTER — Other Ambulatory Visit (HOSPITAL_COMMUNITY): Payer: Medicare Other

## 2021-09-13 LAB — MYCOBACTERIA,CULT W/FLUOROCHROME SMEAR
MICRO NUMBER:: 12891164
SPECIMEN QUALITY:: ADEQUATE

## 2021-09-14 NOTE — Telephone Encounter (Signed)
Pt ready for scheduling on or after 09/14/21 ? ?Out-of-pocket cost due at time of visit: $ ? ?Primary: Medicare ?Prolia co-insurance: 20% (approximately $276) ?Admin fee co-insurance: 20% (approximately $25) ? ?Secondary: [employer] Medicare Supp ?Prolia co-insurance: Covers Medicare Part B co-insurance ?Admin fee co-insurance: Covers Medicare Part B co-insurance and deductible.  ? ?Deductible: %226 of $226 met (not covered by secondary) ? ?Prior Auth: not required ?PA# ?Valid:  ? ?** This summary of benefits is an estimation of the patient's out-of-pocket cost. Exact cost may vary based on individual plan coverage.  ? ?

## 2021-09-16 ENCOUNTER — Encounter: Payer: Self-pay | Admitting: Internal Medicine

## 2021-09-16 ENCOUNTER — Telehealth: Payer: Self-pay | Admitting: Pharmacist

## 2021-09-16 NOTE — Telephone Encounter (Signed)
Patient picked up 2 bottles of clofazimine today. Supply should last until the beginning of June. ? ?Godric Lavell L. Anne-Marie Genson, PharmD ?[facility] Clinical Pharmacist Practitioner ? ?

## 2021-09-19 DIAGNOSIS — Z20828 Contact with and (suspected) exposure to other viral communicable diseases: Secondary | ICD-10-CM | POA: Diagnosis not present

## 2021-09-29 ENCOUNTER — Other Ambulatory Visit: Payer: Self-pay

## 2021-09-29 ENCOUNTER — Other Ambulatory Visit: Payer: Medicare Other

## 2021-09-29 ENCOUNTER — Telehealth: Payer: Self-pay | Admitting: Hematology and Oncology

## 2021-09-29 DIAGNOSIS — M8080XS Other osteoporosis with current pathological fracture, unspecified site, sequela: Secondary | ICD-10-CM

## 2021-09-29 NOTE — Telephone Encounter (Signed)
Rescheduled appointment per providers. Patient aware.  ? ?

## 2021-09-29 NOTE — Progress Notes (Unsigned)
Total volume 1,050 started 09-28-21 7:15 am ended 09-29-21 at 7:00am. ?

## 2021-10-06 LAB — CALCIUM, URINE, 24 HOUR: Calcium, 24H Urine: 140 mg/24 h

## 2021-10-06 LAB — CORTISOL, URINE, 24 HOUR
24 Hour urine volume (VMAHVA): 1050 mL
CREATININE, URINE: 0.89 g/(24.h) (ref 0.50–2.15)
Cortisol (Ur), Free: 15.9 mcg/24 h (ref 4.0–50.0)

## 2021-10-10 DIAGNOSIS — Z20822 Contact with and (suspected) exposure to covid-19: Secondary | ICD-10-CM | POA: Diagnosis not present

## 2021-10-11 ENCOUNTER — Ambulatory Visit (HOSPITAL_COMMUNITY)
Admission: RE | Admit: 2021-10-11 | Discharge: 2021-10-11 | Disposition: A | Discharge status: Home / Self Care | Payer: Medicare Other | Source: Ambulatory Visit | Attending: Internal Medicine | Admitting: Internal Medicine

## 2021-10-11 DIAGNOSIS — E785 Hyperlipidemia, unspecified: Secondary | ICD-10-CM | POA: Diagnosis not present

## 2021-10-11 LAB — HEPATIC FUNCTION PANEL
ALT: 40 U/L (ref 0–44)
AST: 39 U/L (ref 15–41)
Albumin: 3.7 g/dL (ref 3.5–5.0)
Alkaline Phosphatase: 89 U/L (ref 38–126)
Bilirubin, Direct: 0.1 mg/dL (ref 0.0–0.2)
Indirect Bilirubin: 0.6 mg/dL (ref 0.3–0.9)
Total Bilirubin: 0.7 mg/dL (ref 0.3–1.2)
Total Protein: 7 g/dL (ref 6.5–8.1)

## 2021-10-11 LAB — LIPID PANEL
Cholesterol: 145 mg/dL (ref 0–200)
HDL: 77 mg/dL
LDL Cholesterol: 62 mg/dL (ref 0–99)
Total CHOL/HDL Ratio: 1.9 ratio
Triglycerides: 32 mg/dL
VLDL: 6 mg/dL (ref 0–40)

## 2021-10-14 DIAGNOSIS — Z20822 Contact with and (suspected) exposure to covid-19: Secondary | ICD-10-CM | POA: Diagnosis not present

## 2021-10-19 ENCOUNTER — Ambulatory Visit: Payer: Medicare Other | Admitting: Hematology and Oncology

## 2021-10-19 ENCOUNTER — Other Ambulatory Visit: Payer: Medicare Other

## 2021-10-19 NOTE — Progress Notes (Signed)
? ?Patient Care Team: ?[name], MD as PCP - General (Internal Medicine) ?Magrinat, Virgie Dad, MD (Inactive) as Consulting Physician (Oncology) ?Collene Gobble, MD as Consulting Physician (Pulmonary Disease) ?[name], MD as Referring Physician ?Armbruster, Carlota Raspberry, MD as Consulting Physician (Gastroenterology) ?Kem Boroughs, FNP (Family Medicine) ?[name], MD as Consulting Physician (Radiation Oncology) ?Rolm Bookbinder, MD as Consulting Physician (General Surgery) ?Larey Dresser, MD as Consulting Physician (Cardiology) ?Bensimhon, Shaune Pascal, MD as Consulting Physician (Cardiology) ? ?DIAGNOSIS:  ?Encounter Diagnosis  ?Name Primary?  ? Malignant neoplasm of upper-inner quadrant of left breast in female, estrogen receptor positive ([place])   ? ? ?SUMMARY OF ONCOLOGIC HISTORY: ?Oncology History  ?Malignant neoplasm of upper-inner quadrant of left breast in female, estrogen receptor positive ([place])  ?06/16/2014 Initial Diagnosis  ? Malignant neoplasm of upper-inner quadrant of left breast in female, estrogen receptor positive ([place]) ? ?  ? Genetic Testing  ? Positive genetic test result. 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 TP53 is associated with Li-Fraumeni syndrome. The STAT Breast cancer panel offered by [facility] includes sequencing and rearrangement analysis for the following 9 genes:  ATM, BRCA1, BRCA2, CDH1, CHEK2, PALB2, PTEN, STK11 and TP53. The results of her Common Hereditary Cancer Panel test are still pending.  ? ?Report date is 02/20/2018.  ? ?Positive genetic test result. 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 The Common Hereditary Cancers Panel offered by [facility] includes sequencing and/or deletion duplication testing of the following 47 genes: APC, ATM, AXIN2, BARD1, BMPR1A, BRCA1, BRCA2, BRIP1, CDH1, CDKN2A (p14ARF), CDKN2A (p16INK4a), CKD4, CHEK2,  CTNNA1, DICER1, EPCAM (Deletion/duplication testing only), GREM1 (promoter region deletion/duplication testing only), KIT, MEN1, MLH1, MSH2, MSH3, MSH6, MUTYH, NBN, NF1, NHTL1, PALB2, PDGFRA, PMS2, POLD1, POLE, PTEN, RAD50, RAD51C, RAD51D, SDHB, SDHC, SDHD, SMAD4, SMARCA4. STK11, TP53, TSC1, TSC2, and VHL.  The following genes were evaluated for sequence changes only: SDHA and HOXB13 c.251G>A variant only. ? ?Report date is 02/22/2018. ? ?  ?04/27/2018 - 04/27/2018 Chemotherapy  ? The patient had trastuzumab (HERCEPTIN) 210 mg in sodium chloride 0.9 % 250 mL chemo infusion, 4 mg/kg = 210 mg, Intravenous,  Once, 1 of 16 cycles ?Administration: 210 mg (04/27/2018) ?PACLitaxel (TAXOL) 126 mg in sodium chloride 0.9 % 250 mL chemo infusion (</= 32m/m2), 80 mg/m2 = 126 mg, Intravenous,  Once, 1 of 3 cycles ?Administration: 126 mg (04/27/2018) ? ? for chemotherapy treatment.  ? ?  ?05/03/2018 - 07/06/2018 Chemotherapy  ? The patient had PACLitaxel-protein bound (ABRAXANE) chemo infusion 150 mg, 100 mg/m2 = 150 mg, Intravenous, Once, 3 of 4 cycles ?Administration: 150 mg (05/03/2018), 150 mg (05/10/2018), 150 mg (05/17/2018), 150 mg (05/31/2018), 150 mg (06/08/2018), 150 mg (06/21/2018), 150 mg (06/28/2018), 150 mg (07/06/2018) ? ? for chemotherapy treatment.  ? ?  ?05/03/2018 - 06/08/2018 Chemotherapy  ? The patient had trastuzumab (HERCEPTIN) 105 mg in sodium chloride 0.9 % 250 mL chemo infusion, 2 mg/kg = 105 mg (100 % of original dose 2 mg/kg), Intravenous,  Once, 6 of 6 cycles ?Dose modification: 2 mg/kg (original dose 2 mg/kg, Cycle 1, Reason: Provider Judgment) ?Administration: 105 mg (05/03/2018), 105 mg (05/10/2018), 105 mg (05/17/2018), 105 mg (05/24/2018), 105 mg (05/31/2018), 105 mg (06/08/2018) ? ? for chemotherapy treatment.  ? ?  ?05/04/2018 - 05/04/2018 Chemotherapy  ? The patient had trastuzumab (HERCEPTIN) 105 mg in sodium chloride 0.9 % 250 mL chemo infusion, 2 mg/kg = 105  mg (25 % of original dose 8  mg/kg), Intravenous,  Once, 0 of 5 cycles ?Dose modification: 2 mg/kg (original dose 8 mg/kg, Cycle 1, Reason: Provider Judgment) ? ? for chemotherapy treatment.  ? ?  ?07/13/2018 - 10/24/2018 Chemotherapy  ? The patient had trastuzumab (HERCEPTIN) 450 mg in sodium chloride 0.9 % 250 mL chemo infusion, 441 mg, Intravenous,  Once, 6 of 15 cycles ?Administration: 450 mg (07/13/2018), 300 mg (08/01/2018), 300 mg (10/03/2018), 300 mg (08/22/2018), 300 mg (09/12/2018), 300 mg (10/24/2018) ? ? for chemotherapy treatment.  ? ?  ? ? ?CHIEF COMPLIANT: triple positive breast cancer and history of homozygous p53 mutation on surveillance ? ?INTERVAL HISTORY: CHIANTI GOH is a 71 y.o. with the above mention triple positive breast cancer and history of homozygous p53 mutation. Currently on Anastrozole. She presents to the clinic today ?For a follow-up. She state that she is tolerating letrozole but complains of some hot flashes. ? ?ALLERGIES:  is allergic to rifampin, taxol [paclitaxel], paclitaxel (protein-bound), tape, abraxane [paclitaxel protein-bound part], and clarithromycin. ? ?MEDICATIONS:  ?Current Outpatient Medications  ?Medication Sig Dispense Refill  ? acyclovir (ZOVIRAX) 400 MG tablet TAKE 1 TABLET BY MOUTH TWICE DAILY 120 tablet prn  ? albuterol (VENTOLIN HFA) 108 (90 Base) MCG/ACT inhaler INHALE 2 PUFFS INTO LUNGS EVERY 6 HOURS AS NEEDED FOR WHEEZING OR SHORTNESS OF BREATH. 8.5 g PRN  ? azithromycin (ZITHROMAX) 500 MG tablet Take 1 tablet (500 mg total) by mouth daily. 30 tablet 11  ? Calcium Carbonate-Vit D-Min (CALCIUM 1200 PO) Take by mouth daily.    ? chlorpheniramine-HYDROcodone (TUSSIONEX PENNKINETIC ER) 10-8 MG/5ML Take 5 mLs by mouth every 12 (twelve) hours as needed for cough. 115 mL 0  ? Cholecalciferol (D3 2000 PO) Take by mouth.    ? Clofazimine POWD 100 mg by Does not apply route every morning. 3000 g 11  ? ethambutol (MYAMBUTOL) 400 MG tablet Take 2 tablets (800 mg total) by mouth daily. 60 tablet 11  ?  HYDROcodone-acetaminophen (NORCO) 10-325 MG tablet One half to one tab with food every 8 hours  as needed sparingly for pain. Note this is a stronger dose than before 15 tablet 0  ? letrozole (FEMARA) 2.5 MG tablet Take 2.5 mg by mouth daily.    ? Multiple Vitamins-Minerals (ONE-A-DAY WOMENS PO) Take by mouth.    ? promethazine (PHENERGAN) 12.5 MG tablet Take 1 tablet (12.5 mg total) by mouth every 8 (eight) hours as needed. 30 tablet 1  ? rosuvastatin (CRESTOR) 10 MG tablet Take 1 tablet (10 mg total) by mouth daily. 30 tablet 11  ? vitamin C (ASCORBIC ACID) 500 MG tablet Take 500 mg by mouth daily.    ? ?No current facility-administered medications for this visit.  ? ? ?PHYSICAL EXAMINATION: ?ECOG PERFORMANCE STATUS: 1 - Symptomatic but completely ambulatory ? ?Vitals:  ? 11/02/21 1502  ?BP: 131/89  ?Pulse: 85  ?Resp: 18  ?Temp: 97.6 ?F (36.4 ?C)  ?SpO2: 99%  ? ?Filed Weights  ? 11/02/21 1502  ?Weight: 110 lb 11.2 oz (50.2 kg)  ? ? ?BREAST: No palpable masses or nodules in either right or left chest wall or axilla (exam performed in the presence of a chaperone) ? ?LABORATORY DATA:  ?I have reviewed the data as listed ? ?  Latest Ref Rng & Units 10/11/2021  ? 11:30 AM 08/25/2021  ?  2:00 PM 07/22/2021  ? 10:55 AM  ?CMP  ?Glucose 70 - 99 mg/dL  95     ?  BUN 6 - 23 mg/dL  13     ?Creatinine 0.40 - 1.20 mg/dL  0.90   0.70    ?Sodium 135 - 145 mEq/L  136     ?Potassium 3.5 - 5.1 mEq/L  4.4     ?Chloride 96 - 112 mEq/L  101     ?CO2 19 - 32 mEq/L  28     ?Calcium 8.4 - 10.5 mg/dL  9.4     ?Total Protein 6.5 - 8.1 g/dL 7.0   7.6     ?Total Bilirubin 0.3 - 1.2 mg/dL 0.7   0.4     ?Alkaline Phos 38 - 126 U/L 89   97     ?AST 15 - 41 U/L 39   26     ?ALT 0 - 44 U/L 40   19     ? ? ?Lab Results  ?Component Value Date  ? WBC 3.8 (L) 11/02/2021  ? HGB 11.4 (L) 11/02/2021  ? HCT 34.1 (L) 11/02/2021  ? MCV 93.2 11/02/2021  ? PLT 235 11/02/2021  ? NEUTROABS 2.8 11/02/2021  ? ? ?ASSESSMENT & PLAN:  ?Malignant neoplasm of upper-inner  quadrant of left breast in female, estrogen receptor positive ([place]) ?06/06/2005: Right upper lobe lung wedge resection with mediastinal lymph node dissection: 1.2 cm grade 3 adenocarcinoma T1N1 stage Ia (followed at [facility]

## 2021-10-20 ENCOUNTER — Ambulatory Visit (INDEPENDENT_AMBULATORY_CARE_PROVIDER_SITE_OTHER): Payer: Medicare Other | Admitting: Internal Medicine

## 2021-10-20 ENCOUNTER — Encounter: Payer: Self-pay | Admitting: Internal Medicine

## 2021-10-20 ENCOUNTER — Other Ambulatory Visit: Payer: Self-pay

## 2021-10-20 DIAGNOSIS — A310 Pulmonary mycobacterial infection: Secondary | ICD-10-CM | POA: Diagnosis not present

## 2021-10-20 NOTE — Progress Notes (Signed)
?  ? ? ? ? ?[facility] ? ?Patient Active Problem List  ? Diagnosis Date Noted  ? Mycobacterium avium infection ([place]) 02/11/2020  ?  Priority: High  ? Hearing loss 11/04/2020  ?  Priority: Medium   ? Intractable nausea and vomiting 02/20/2020  ?  Priority: Medium   ? Weight loss, non-intentional 09/20/2019  ?  Priority: Medium   ? Localized osteoporosis with current pathological fracture 08/25/2021  ? Skin rash 02/28/2020  ? Hyponatremia   ? Dehydration with hyponatremia 02/20/2020  ? Low back pain 02/20/2020  ? Chronic diastolic CHF (congestive heart failure) ([place]) 02/20/2020  ? Essential hypertension 02/20/2020  ? Acute metabolic encephalopathy 28/78/6767  ? Right epiretinal membrane 01/30/2020  ? 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  ? Nuclear sclerotic cataract of right eye 01/30/2020  ? Nuclear sclerotic cataract of left eye 01/30/2020  ? Iron deficiency anemia 01/20/2020  ? Malignant neoplasm of overlapping sites of right breast in female, estrogen receptor positive ([place]) 09/20/2019  ? Postprocedural pneumothorax   ? 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 Port-A-Cath in place 04/26/2018  ? Genetic testing 02/20/2018  ? Family history of breast cancer   ? Family history of lung cancer   ? Personal history of lung cancer   ? Chest pain 11/28/2017  ? Abnormal CT of the chest 11/30/2016  ? Malignant neoplasm of upper-inner quadrant of left breast in female, estrogen receptor positive ([place]) 06/16/2014  ? Osteopenia 09/13/2013  ? Postmenopausal atrophic vaginitis 09/13/2013  ? Vitamin D deficiency 09/13/2013  ? Malignant neoplasm of lower lobe of right lung ([place]) 07/24/2012  ? Anxiety 03/11/2011  ? History of neutropenia 03/11/2011  ? Family history of colon cancer 03/11/2011  ? Bronchiectasis without complication ([place]) 20/94/7096  ? ? ?Patient's Medications  ?New Prescriptions  ? No medications on file  ?Previous Medications  ? ACYCLOVIR (ZOVIRAX) 400 MG TABLET    TAKE 1 TABLET BY MOUTH TWICE  DAILY  ? ALBUTEROL (VENTOLIN HFA) 108 (90 BASE) MCG/ACT INHALER    INHALE 2 PUFFS INTO LUNGS EVERY 6 HOURS AS NEEDED FOR WHEEZING OR SHORTNESS OF BREATH.  ? AZITHROMYCIN (ZITHROMAX) 500 MG TABLET    Take 1 tablet (500 mg total) by mouth daily.  ? CALCIUM CARBONATE-VIT D-MIN (CALCIUM 1200 PO)    Take by mouth daily.  ? CHLORPHENIRAMINE-HYDROCODONE (TUSSIONEX PENNKINETIC ER) 10-8 MG/5ML    Take 5 mLs by mouth every 12 (twelve) hours as needed for cough.  ? CHOLECALCIFEROL (D3 2000 PO)    Take by mouth.  ? CLOFAZIMINE POWD    100 mg by Does not apply route every morning.  ? ETHAMBUTOL (MYAMBUTOL) 400 MG TABLET    Take 2 tablets (800 mg total) by mouth daily.  ? HYDROCODONE-ACETAMINOPHEN (NORCO) 10-325 MG TABLET    One half to one tab with food every 8 hours  as needed sparingly for pain. Note this is a stronger dose than before  ? LETROZOLE (FEMARA) 2.5 MG TABLET    Take 2.5 mg by mouth daily.  ? MULTIPLE VITAMINS-MINERALS (ONE-A-DAY WOMENS PO)    Take by mouth.  ? PROMETHAZINE (PHENERGAN) 12.5 MG TABLET    Take 1 tablet (12.5 mg total) by mouth every 8 (eight) hours as needed.  ? ROSUVASTATIN (CRESTOR) 10 MG TABLET    Take 1 tablet (10 mg total) by mouth daily.  ? VITAMIN C (ASCORBIC ACID) 500 MG TABLET    Take 500 mg by mouth daily.  ?Modified Medications  ? No medications  on file  ?Discontinued Medications  ? No medications on file  ? ? ?Subjective: ?Anna Valentine is in for her routine follow-up visit.  She remains on azithromycin, clofazimine and ethambutol.  She continues to have infrequent, mild nausea and fairly rare episodes of diarrhea but otherwise is tolerating her antibiotics well.  She has had some blurred vision recently and got new prescription glasses.  No abnormalities were noted with testing for color vision.  She says that overall she is feeling much better.  She is not having any cough and says that she would not be able to give a sputum sample for repeat AFB culture or testing.  She is still having some  dyspnea on exertion but is now walking about a mile and a half several times each week and is feeling much stronger. ? ?Review of Systems: ?Review of Systems  ?Constitutional:  Negative for fever and weight loss.  ?Respiratory:  Positive for shortness of breath. Negative for cough, hemoptysis, sputum production and wheezing.   ? ?Past Medical History:  ?Diagnosis Date  ? Anemia   ? hx of IDA  ? BRCA negative 2011  ? BRCA I/ II negative  ? Breast cancer ([place]) 08/2009  ? LEFT=stage 2, rx with lumpectomy and xrt  ? Breast cancer ([place]) 2019  ? RIGHT  ? Bronchiectasis ([place])   ? Chronic diastolic CHF (congestive heart failure) ([place]) 02/20/2020  ? Constipation   ? Essential hypertension 02/20/2020  ? Family history of adverse reaction to anesthesia   ? My Sister has nausea  ? Family history of breast cancer   ? Family history of colon cancer   ? Family history of lung cancer   ? GERD (gastroesophageal reflux disease)   ? not presently having symptoms  ? Hypertension   ? Iatrogenic pneumothorax 02/06/2019  ? Lung cancer ([place]) 06/06/05  ? stage 1 poorly differentiated adenocarcinoma, s/p right lower lobectomy.  ? Mycobacterium avium complex ([place])   ? dx 2021  ? Osteopenia 09/13/2013  ? Osteoporosis   ? Personal history of lung cancer   ? Pneumonia   ? 2007ish, walking pnemonia - 2009 ish  ? STD (sexually transmitted disease)   ? HSV  ? Vitamin D deficiency 09/13/2013  ? ? ?Social History  ? ?Tobacco Use  ? Smoking status: Former  ?  Packs/day: 2.00  ?  Years: 18.00  ?  Pack years: 36.00  ?  Types: Cigarettes  ?  Quit date: 07/12/1987  ?  Years since quitting: 34.2  ? Smokeless tobacco: Never  ?Vaping Use  ? Vaping Use: Never used  ?Substance Use Topics  ? Alcohol use: Yes  ?  Comment: occasionally  ? Drug use: No  ? ? ?Family History  ?Problem Relation Age of Onset  ? Allergies Mother   ? Asthma Mother   ? Lung cancer Mother   ? Breast cancer Mother 30  ?     recurrence age 32  ? Colon cancer Father 20  ? Colon polyps Father 64  ?  Prostate cancer Brother 70  ? Breast cancer Sister 36  ?     Recurrence age 78 BRCA negative  ? Colon polyps Sister   ? Leukemia Sister   ? Breast cancer Sister 9  ? Prostate cancer Brother 78  ? Breast cancer Maternal Grandmother 13  ? Colon cancer Maternal Aunt   ? Leukemia Maternal Grandfather   ? Lung cancer Maternal Aunt   ? Breast cancer Cousin   ?  Esophageal cancer Neg Hx   ? Rectal cancer Neg Hx   ? Stomach cancer Neg Hx   ? ? ?Allergies  ?Allergen Reactions  ? Rifampin Nausea And Vomiting  ? Taxol [Paclitaxel] Anaphylaxis  ? Paclitaxel (Protein-Bound) Hives  ? Tape Itching  ? Abraxane [Paclitaxel Protein-Bound Part] Rash  ? Clarithromycin Rash  ? ? ?Objective: ?Vitals:  ? 10/20/21 1027  ?BP: 127/79  ?Pulse: 75  ?Temp: (!) 97.4 ?F (36.3 ?C)  ?TempSrc: Oral  ?SpO2: 100%  ?Weight: 109 lb (49.4 kg)  ?Height: _0  (1.676 m)  ? ?Body mass index is 17.59 kg/m?. ? ?Physical Exam ?Constitutional:   ?   Comments: She is in very good spirits.  ?Cardiovascular:  ?   Rate and Rhythm: Normal rate and regular rhythm.  ?   Heart sounds: No murmur heard. ?Pulmonary:  ?   Effort: Pulmonary effort is normal.  ?   Breath sounds: Normal breath sounds.  ?Abdominal:  ?   Palpations: Abdomen is soft.  ?   Tenderness: There is no abdominal tenderness.  ?Psychiatric:     ?   Mood and Affect: Mood normal.  ? ? ?Lab Results ? ?  ?Problem List Items Addressed This Visit   ? ?  ? High  ? Mycobacterium avium infection ([place])  ?  She has chronic, smoldering Mycobacterium avium pneumonia.  Her latest sputum culture remained positive in January of this year but a follow-up CT scan on 07/22/2021 did not show any acute inflammatory changes in her lungs.  Most importantly clinically she is much improved.  She will continue her current 3 drug regimen and follow-up in 6 months. ?  ?  ? ? ? ?Michel Bickers, MD ?[facility] ?[place] ?[phone] pager   [phone] cell ?10/20/2021, 11:31 AM ?

## 2021-10-20 NOTE — Assessment & Plan Note (Signed)
She has chronic, smoldering Mycobacterium avium pneumonia.  Her latest sputum culture remained positive in January of this year but a follow-up CT scan on 07/22/2021 did not show any acute inflammatory changes in her lungs.  Most importantly clinically she is much improved.  She will continue her current 3 drug regimen and follow-up in 6 months. ?

## 2021-10-21 ENCOUNTER — Other Ambulatory Visit: Payer: Medicare Other

## 2021-10-25 DIAGNOSIS — Z20822 Contact with and (suspected) exposure to covid-19: Secondary | ICD-10-CM | POA: Diagnosis not present

## 2021-11-01 ENCOUNTER — Other Ambulatory Visit: Payer: Self-pay | Admitting: *Deleted

## 2021-11-01 DIAGNOSIS — Z170 Estrogen receptor positive status [ER+]: Secondary | ICD-10-CM

## 2021-11-02 ENCOUNTER — Other Ambulatory Visit: Payer: Self-pay

## 2021-11-02 ENCOUNTER — Inpatient Hospital Stay: Payer: Medicare Other | Attending: Hematology and Oncology

## 2021-11-02 ENCOUNTER — Inpatient Hospital Stay (HOSPITAL_BASED_OUTPATIENT_CLINIC_OR_DEPARTMENT_OTHER): Payer: Medicare Other | Admitting: Hematology and Oncology

## 2021-11-02 DIAGNOSIS — C50212 Malignant neoplasm of upper-inner quadrant of left female breast: Secondary | ICD-10-CM | POA: Insufficient documentation

## 2021-11-02 DIAGNOSIS — Z888 Allergy status to other drugs, medicaments and biological substances status: Secondary | ICD-10-CM | POA: Insufficient documentation

## 2021-11-02 DIAGNOSIS — Z9013 Acquired absence of bilateral breasts and nipples: Secondary | ICD-10-CM | POA: Insufficient documentation

## 2021-11-02 DIAGNOSIS — Z170 Estrogen receptor positive status [ER+]: Secondary | ICD-10-CM

## 2021-11-02 DIAGNOSIS — Z85118 Personal history of other malignant neoplasm of bronchus and lung: Secondary | ICD-10-CM | POA: Diagnosis not present

## 2021-11-02 DIAGNOSIS — Z79899 Other long term (current) drug therapy: Secondary | ICD-10-CM | POA: Insufficient documentation

## 2021-11-02 DIAGNOSIS — Z1509 Genetic susceptibility to other malignant neoplasm: Secondary | ICD-10-CM | POA: Diagnosis not present

## 2021-11-02 LAB — CBC WITH DIFFERENTIAL (CANCER CENTER ONLY)
Abs Immature Granulocytes: 0.01 10*3/uL (ref 0.00–0.07)
Basophils Absolute: 0 10*3/uL (ref 0.0–0.1)
Basophils Relative: 0 %
Eosinophils Absolute: 0.1 10*3/uL (ref 0.0–0.5)
Eosinophils Relative: 1 %
HCT: 34.1 % — ABNORMAL LOW (ref 36.0–46.0)
Hemoglobin: 11.4 g/dL — ABNORMAL LOW (ref 12.0–15.0)
Immature Granulocytes: 0 %
Lymphocytes Relative: 14 %
Lymphs Abs: 0.6 10*3/uL — ABNORMAL LOW (ref 0.7–4.0)
MCH: 31.1 pg (ref 26.0–34.0)
MCHC: 33.4 g/dL (ref 30.0–36.0)
MCV: 93.2 fL (ref 80.0–100.0)
Monocytes Absolute: 0.5 10*3/uL (ref 0.1–1.0)
Monocytes Relative: 12 %
Neutro Abs: 2.8 10*3/uL (ref 1.7–7.7)
Neutrophils Relative %: 73 %
Platelet Count: 235 10*3/uL (ref 150–400)
RBC: 3.66 MIL/uL — ABNORMAL LOW (ref 3.87–5.11)
RDW: 14.6 % (ref 11.5–15.5)
WBC Count: 3.8 10*3/uL — ABNORMAL LOW (ref 4.0–10.5)
nRBC: 0 % (ref 0.0–0.2)

## 2021-11-02 LAB — IRON AND IRON BINDING CAPACITY (CC-WL,HP ONLY)
Iron: 61 ug/dL (ref 28–170)
Saturation Ratios: 21 % (ref 10.4–31.8)
TIBC: 286 ug/dL (ref 250–450)
UIBC: 225 ug/dL (ref 148–442)

## 2021-11-02 LAB — FERRITIN: Ferritin: 622 ng/mL — ABNORMAL HIGH (ref 11–307)

## 2021-11-02 NOTE — Assessment & Plan Note (Addendum)
06/06/2005: Right upper lobe lung wedge resection with mediastinal lymph node dissection: 1.2 cm grade 3 adenocarcinoma T1N1 stage Ia (followed at [facility]) ?------------------------------------------------------------ ?Left breast cancer ?09/02/2009: Invasive lobular cancer ER 98%, PR 96%, Ki-67 14%, HER2 negative ?10/12/2009: Left lumpectomy with sentinel lymph node biopsy: T1BN1 stage IIa invasive lobular cancer (Oncotype DX 16: ROR 10%) ?01/28/2010: Adjuvant radiation ?July 2011-July 2016: Anastrozole  ?------------------------------------------------ ?Right breast cancer ?01/29/2018: T1CN0 stage Ia IDC grade 2 ER positive PR negative HER2 amplified Ki-67 15% ?03/15/2018: Bilateral mastectomies, right breast T1CN0 stage Ia grade 2 IDC 0/6 lymph nodes ?04/26/2018: Taxol Herceptin (switch to Abraxane because of initial reaction, discontinued 07/13/2018) completed 10 cycles ? ?Current treatment: Tamoxifen started 08/01/2018 switched to anastrozole 04/19/2021 because of blood clot concerns ?Osteoporosis: Bone density 02/21/2012 at [facility] T score -2.5: Will be starting Prolia (with her PCP) along with calcium and vitamin D ?Genetics: T p53 pathogenic variant ? ?Breast cancer surveillance: ?1.  Breast exam: Benign ?2. no role of mammograms since she had bilateral mastectomies ? ?Return to clinic in 1 year for follow-up ? ?

## 2021-11-04 DIAGNOSIS — Z20822 Contact with and (suspected) exposure to covid-19: Secondary | ICD-10-CM | POA: Diagnosis not present

## 2021-11-15 DIAGNOSIS — Z20822 Contact with and (suspected) exposure to covid-19: Secondary | ICD-10-CM | POA: Diagnosis not present

## 2021-12-09 ENCOUNTER — Ambulatory Visit (INDEPENDENT_AMBULATORY_CARE_PROVIDER_SITE_OTHER): Payer: Medicare Other | Admitting: Internal Medicine

## 2021-12-09 ENCOUNTER — Telehealth: Payer: Self-pay | Admitting: Internal Medicine

## 2021-12-09 ENCOUNTER — Encounter: Payer: Self-pay | Admitting: Internal Medicine

## 2021-12-09 VITALS — BP 118/78 | HR 79 | Temp 98.7°F | Resp 12

## 2021-12-09 DIAGNOSIS — R42 Dizziness and giddiness: Secondary | ICD-10-CM

## 2021-12-09 DIAGNOSIS — H6502 Acute serous otitis media, left ear: Secondary | ICD-10-CM

## 2021-12-09 DIAGNOSIS — Z8619 Personal history of other infectious and parasitic diseases: Secondary | ICD-10-CM | POA: Diagnosis not present

## 2021-12-09 DIAGNOSIS — Z853 Personal history of malignant neoplasm of breast: Secondary | ICD-10-CM

## 2021-12-09 DIAGNOSIS — Z85118 Personal history of other malignant neoplasm of bronchus and lung: Secondary | ICD-10-CM | POA: Diagnosis not present

## 2021-12-09 DIAGNOSIS — H9203 Otalgia, bilateral: Secondary | ICD-10-CM

## 2021-12-09 MED ORDER — MECLIZINE HCL 25 MG PO TABS: 25.0000 mg | ORAL_TABLET | Freq: Three times a day (TID) | ORAL | 0 refills | Status: DC | PRN

## 2021-12-09 MED ORDER — CEFTRIAXONE SODIUM 1 G IJ SOLR
1.0000 g | Freq: Once | INTRAMUSCULAR | Status: AC
  Administered 2021-12-09: 1 g via INTRAMUSCULAR

## 2021-12-09 NOTE — Telephone Encounter (Signed)
Anna Valentine [phone]  Letizia called to say she has Left ear pain, vertigo comes and goes since Monday. I went ahead and scheduled her for this afternoon.

## 2021-12-09 NOTE — Progress Notes (Unsigned)
   Subjective:    Patient ID: Anna Valentine, female    DOB: 10/25/1950, 71 y.o.   MRN: [id number]  HPI 71 year old Female Thursday had left ear. Has had hearing aids since October 2022. Monday had vertigo - woke up with it. No chills, no cough, No URI symptoms Both ears feel stuffy even though not wearing hearing aids. Now feels unsteady with positiona change  Had temporary crown placed last week.    Review of Systems     Objective:   Physical Exam        Assessment & Plan:

## 2021-12-14 ENCOUNTER — Ambulatory Visit: Payer: Medicare Other

## 2021-12-20 ENCOUNTER — Encounter (INDEPENDENT_AMBULATORY_CARE_PROVIDER_SITE_OTHER): Payer: Self-pay

## 2021-12-24 ENCOUNTER — Ambulatory Visit (INDEPENDENT_AMBULATORY_CARE_PROVIDER_SITE_OTHER): Payer: Medicare Other

## 2021-12-24 DIAGNOSIS — M8080XS Other osteoporosis with current pathological fracture, unspecified site, sequela: Secondary | ICD-10-CM

## 2021-12-24 MED ORDER — DENOSUMAB 60 MG/ML ~~LOC~~ SOSY
60.0000 mg | PREFILLED_SYRINGE | Freq: Once | SUBCUTANEOUS | Status: AC
  Administered 2021-12-24: 60 mg via SUBCUTANEOUS

## 2021-12-24 NOTE — Progress Notes (Signed)
Patient seen today for Prolia injection.  60mg /ml given in right arm subQ.  Patient tolerated well.  Follow up 6 months or as advised by provider.   Patient verified name, DOB and provided verbal consent prior to administration. Patient monitored 15 mins post injection to ensure she didn't experience any side effects.

## 2021-12-26 ENCOUNTER — Encounter: Payer: Self-pay | Admitting: Internal Medicine

## 2021-12-28 ENCOUNTER — Telehealth: Payer: Self-pay | Admitting: Pharmacist

## 2021-12-28 NOTE — Telephone Encounter (Signed)
Patient picked up 2 bottles of clofazimine today (~3 months supply). This should last her until the middle-end of September.   Chanz Cahall L. Tesla Bochicchio, PharmD [facility] Clinical Pharmacist Practitioner

## 2022-01-02 ENCOUNTER — Encounter: Payer: Self-pay | Admitting: Internal Medicine

## 2022-01-08 NOTE — Telephone Encounter (Signed)
Last Prolia inj 12/24/21 Next Prolia inj due 06/26/22

## 2022-01-14 ENCOUNTER — Ambulatory Visit (HOSPITAL_COMMUNITY)
Admission: RE | Admit: 2022-01-14 | Discharge: 2022-01-14 | Disposition: A | Discharge status: Home / Self Care | Payer: Medicare Other | Source: Ambulatory Visit | Attending: Internal Medicine | Admitting: Internal Medicine

## 2022-01-14 DIAGNOSIS — I700 Atherosclerosis of aorta: Secondary | ICD-10-CM | POA: Diagnosis not present

## 2022-01-14 DIAGNOSIS — N281 Cyst of kidney, acquired: Secondary | ICD-10-CM | POA: Diagnosis not present

## 2022-01-14 DIAGNOSIS — I2510 Atherosclerotic heart disease of native coronary artery without angina pectoris: Secondary | ICD-10-CM | POA: Insufficient documentation

## 2022-01-14 DIAGNOSIS — M4854XA Collapsed vertebra, not elsewhere classified, thoracic region, initial encounter for fracture: Secondary | ICD-10-CM | POA: Insufficient documentation

## 2022-01-14 DIAGNOSIS — J479 Bronchiectasis, uncomplicated: Secondary | ICD-10-CM | POA: Insufficient documentation

## 2022-01-24 DIAGNOSIS — H903 Sensorineural hearing loss, bilateral: Secondary | ICD-10-CM | POA: Diagnosis not present

## 2022-02-08 ENCOUNTER — Encounter: Payer: Self-pay | Admitting: Emergency Medicine

## 2022-02-08 ENCOUNTER — Ambulatory Visit (INDEPENDENT_AMBULATORY_CARE_PROVIDER_SITE_OTHER): Payer: Medicare Other | Admitting: Emergency Medicine

## 2022-02-08 VITALS — BP 124/72 | HR 73 | Temp 97.7°F | Ht 66.0 in | Wt 111.8 lb

## 2022-02-08 DIAGNOSIS — C3431 Malignant neoplasm of lower lobe, right bronchus or lung: Secondary | ICD-10-CM | POA: Diagnosis not present

## 2022-02-08 DIAGNOSIS — J479 Bronchiectasis, uncomplicated: Secondary | ICD-10-CM

## 2022-02-08 DIAGNOSIS — A310 Pulmonary mycobacterial infection: Secondary | ICD-10-CM

## 2022-02-08 NOTE — Progress Notes (Signed)
Subjective:    Patient ID: Anna Valentine, female    DOB: [birth year], 71 y.o.   MRN: [id number]  HPI  ROV 08/18/21 --follow-up visit for 71 year old woman with history of right lower lobe non-small cell lung cancer post lobectomy, superimposed mycobacterial infection with some destruction of the remaining right lung.  Also with history of breast cancer.  We discovered a clot in her right lower lobe pulmonary artery stump and treated her with anticoagulation for 3 months - stopped in mid-January.  She remains on azithromycin and ethambutol.  Surveillance cultures were still positive for MAC 06/16/2021. She reports that she has had an upper respiratory infection beginning about 1 week ago.  Increased cough, increased dyspnea.  She is making sputum, clear.  No hemoptysis.  She was treated with ceftriaxone x1, Tussionex.  Still having trouble.  CT-PA 07/22/2021 reviewed by me showed decreased volume of thrombus in the right lower lobe pulmonary artery consistent with chronic PE but decreased.  Stable postsurgical changes and inflammatory changes in the right lung without any progression.  Thoracic vertebral body compression fractures noted  ROV 02/08/22 --71 year old woman with a history of right lower lobe non-small cell lung cancer with right lower lobe lobectomy.  She has significant parenchymal disease on the right due to superimposed mycobacterial infection.  Also with a history of breast cancer.  We discovered clot in her right lower lobe pulmonary artery stump on imaging and treat her with anticoagulation for 3 months.  She has been followed by ID, has remained on azithromycin, clofazamine and ethambutol.  Her most recent CT scan of the chest was done 01/14/2022 as below.  She has baseline exertional SOB but is not limited. Her cough is quiet right now. Her energy and nausea are both much improved.   CT chest 01/14/2022 reviewed by me showed overall stable bronchiectatic change and right-sided parenchymal  distortion, some associated mucous plugging especially in the lingula, unchanged.    Review of Systems  Constitutional:  Positive for activity change and fatigue. Negative for fever and unexpected weight change.  HENT: Negative.  Negative for congestion, dental problem, ear pain, nosebleeds, postnasal drip, rhinorrhea, sinus pressure, sneezing, sore throat and trouble swallowing.   Eyes: Negative.  Negative for redness and itching.  Respiratory: Negative.  Negative for cough, chest tightness, shortness of breath and wheezing.   Cardiovascular: Negative.  Negative for palpitations and leg swelling.  Gastrointestinal: Negative.  Negative for nausea and vomiting.  Genitourinary: Negative.  Negative for dysuria.  Musculoskeletal:  Positive for back pain. Negative for joint swelling.  Skin: Negative.  Negative for rash.  Neurological: Negative.  Negative for headaches.  Hematological: Negative.  Does not bruise/bleed easily.  Psychiatric/Behavioral: Negative.  Negative for dysphoric mood. The patient is not nervous/anxious.         Objective:   Physical Exam Vitals:   02/08/22 1019  BP: 124/72  Pulse: 73  Temp: 97.7 F (36.5 C)  TempSrc: Oral  SpO2: 100%  Weight: 111 lb 12.8 oz (50.7 kg)  Height: _0  (1.676 m)   Gen: stronger, thin woman, well-nourished, in no distress  ENT: No lesions,  mouth clear,  oropharynx clear, no postnasal drip  Neck: No JVD, no stridor  Lungs: No use of accessory muscles, coarse R lung with some inspiratory and expiratory rhonchi. L is clear  Cardiovascular: RRR, heart sounds normal, no murmur or gallops, no peripheral edema  Musculoskeletal: No deformities, no cyanosis or clubbing  Neuro: alert, non focal  Skin: Warm, no lesions or rash     Assessment & Plan:  Mycobacterium avium infection ([place]) Continue azithromycin, ethambutol, clofazimine as you have been taking them.  Follow with Dr. Megan Salon in infectious diseases as planned. We will  plan to repeat your CT scan of the chest in July 2024.  We would consider repeating this sooner if you develop new respiratory symptoms.g. Follow Dr. Lamonte Sakai in 6 months or sooner if you have any problems.  Bronchiectasis without complication (HCC) Keep albuterol available to use 2 puffs if needed for shortness of breath, chest tightness, wheezing  Malignant neoplasm of lower lobe of right lung ([place]) Following serial imaging. No evidence recurrence.    Baltazar Apo, MD, PhD 03/04/2022, 1:10 AM [facility] [phone] or if no answer [phone]

## 2022-02-08 NOTE — Patient Instructions (Addendum)
Continue azithromycin, ethambutol, clofazimine as you have been taking them.  Follow with Dr. Megan Salon in infectious diseases as planned. We will plan to repeat your CT scan of the chest in July 2024.  We would consider repeating this sooner if you develop new respiratory symptoms. Keep albuterol available to use 2 puffs if needed for shortness of breath, chest tightness, wheezing. Follow Dr. Lamonte Sakai in 6 months or sooner if you have any problems.

## 2022-02-23 ENCOUNTER — Encounter: Payer: Self-pay | Admitting: Internal Medicine

## 2022-02-23 ENCOUNTER — Ambulatory Visit (INDEPENDENT_AMBULATORY_CARE_PROVIDER_SITE_OTHER): Payer: Medicare Other | Admitting: Internal Medicine

## 2022-02-23 VITALS — BP 114/72 | HR 72 | Ht 66.0 in | Wt 110.8 lb

## 2022-02-23 DIAGNOSIS — M8080XS Other osteoporosis with current pathological fracture, unspecified site, sequela: Secondary | ICD-10-CM

## 2022-02-23 LAB — BASIC METABOLIC PANEL
BUN: 15 mg/dL (ref 6–23)
CO2: 27 mEq/L (ref 19–32)
Calcium: 8.8 mg/dL (ref 8.4–10.5)
Chloride: 106 mEq/L (ref 96–112)
Creatinine, Ser: 0.71 mg/dL (ref 0.40–1.20)
GFR: 86.11 mL/min (ref >60.00)
Glucose, Bld: 91 mg/dL (ref 70–99)
Potassium: 4.3 mEq/L (ref 3.5–5.1)
Sodium: 138 mEq/L (ref 135–145)

## 2022-02-23 LAB — VITAMIN D 25 HYDROXY (VIT D DEFICIENCY, FRACTURES): VITD: 29.37 ng/mL — ABNORMAL LOW (ref 30.00–100.00)

## 2022-02-23 NOTE — Progress Notes (Unsigned)
Name: Anna Valentine  MRN/ DOB: [id number], [birth year]    Age/ Sex: 71 y.o., female    PCP: [name], MD   Reason for Endocrinology Evaluation: Osteoporosis      Date of Initial Endocrinology Evaluation: 08/25/2021    HPI: Anna Valentine is a 71 y.o. female with a past medical history of dyslipidemia, bronchiectasis , Hx of PE and lung ca( in 2006) . Hx of Breast Ca . The patient presented for initial endocrinology clinic visit on 08/25/2021 for consultative assistance with her Osteoporosis .   Pt was diagnosed with osteoporosis: 2013 with a left total hip T-score of -2.5 . Repeat in 07/2021 - 4.0   Menarche at age : 16 Menopausal at age : 56 Fracture Hx: right finger fracture while tripping  Hx of HRT: no FH of osteoporosis or hip fracture: no Prior Hx of anti-estrogenic therapy : yes Prior Hx of anti-resorptive therapy : alendronate around 2010 . Had to be stopped due to  GERD.  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  Denies falls or recent fractures  Has occasional diarrhea , due to long term Azithromycin due to MAC Denies palpitations     Calcium 1200 mg daily Vitamin D3 2000 IU daily Prolia 60 mg SQ every 6 months    HISTORY:  Past Medical History:  Past Medical History:  Diagnosis Date   Anemia    hx of IDA   BRCA negative 2011   BRCA I/ II negative   Breast cancer ([place]) 08/2009   LEFT=stage 2, rx with lumpectomy and xrt   Breast cancer ([place]) 2019   RIGHT   Bronchiectasis ([place])    Chronic diastolic CHF (congestive heart failure) ([place]) 02/20/2020   Constipation    Essential hypertension 02/20/2020   Family history of adverse reaction to anesthesia    My Sister has nausea   Family history of breast cancer    Family history of  colon cancer    Family history of lung cancer    GERD (gastroesophageal reflux disease)    not presently having symptoms   Hypertension    Iatrogenic pneumothorax 02/06/2019   Lung cancer ([place]) 06/06/05   stage 1 poorly differentiated adenocarcinoma, s/p right lower lobectomy.   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   Osteopenia 09/13/2013   Osteoporosis    Personal history of lung cancer    Pneumonia    2007ish, walking pnemonia - 2009 ish   STD (sexually transmitted disease)    HSV   Vitamin D deficiency 09/13/2013   Past Surgical History:  Past Surgical History:  Procedure Laterality Date   BREAST BIOPSY     BREAST LUMPECTOMY  10/12/2009   Left lumpectomy and radiation, stage II, ER/PR+, Her 2 nu negative   BUNIONECTOMY Bilateral    COLONOSCOPY  2018   SA-MAC-suprep-no polyps   COLONOSCOPY     LOBECTOMY Right 06/06/2005   [facility]   MASTECTOMY W/ SENTINEL NODE BIOPSY Bilateral 03/15/2018   MASTECTOMY W/ SENTINEL NODE BIOPSY Bilateral 03/15/2018   Procedure: BILATERAL TOTAL MASTECTOMIES WITH RIGHT SENTINEL LYMPH NODE BIOPSY;  Surgeon: Rolm Bookbinder, MD;  Location: [facility];  Service: General;  Laterality: Bilateral;   PORTACATH PLACEMENT  N/A 03/15/2018   Procedure: INSERTION PORT-A-CATH WITH [place];  Surgeon: Rolm Bookbinder, MD;  Location: [facility];  Service: General;  Laterality: N/A;   [place]  02/06/2019   Flexible video fiberoptic bronchoscopy with electromagnetic navigation and biopsies.   VIDEO BRONCHOSCOPY WITH ENDOBRONCHIAL NAVIGATION Left 02/06/2019   Procedure: VIDEO BRONCHOSCOPY WITH ENDOBRONCHIAL NAVIGATION, left lung;  Surgeon: Collene Gobble, MD;  [facility];  Service: Thoracic;  Laterality: Left;   VIDEO BRONCHOSCOPY WITH ENDOBRONCHIAL NAVIGATION N/A 01/08/2020   Procedure: VIDEO BRONCHOSCOPY WITH ENDOBRONCHIAL NAVIGATION;  Surgeon: Collene Gobble, MD;  Location: [facility];  Service: Thoracic;  Laterality: N/A;     Social History:  reports that she quit smoking about 34 years ago. Her smoking use included cigarettes. She has a 36.00 pack-year smoking history. She has never used smokeless tobacco. She reports current alcohol use. She reports that she does not use drugs. Family History: family history includes Allergies in her mother; Asthma in her mother; Breast cancer in her cousin; Breast cancer (age of onset: 81) in her sister; Breast cancer (age of onset: 48) in her sister; Breast cancer (age of onset: 10) in her mother; Breast cancer (age of onset: 67) in her maternal grandmother; Colon cancer in her maternal aunt; Colon cancer (age of onset: 8) in her father; Colon polyps in her sister; Colon polyps (age of onset: 22) in her father; Leukemia in her maternal grandfather and sister; Lung cancer in her maternal aunt and mother; Prostate cancer (age of onset: 25) in her brother; Prostate cancer (age of onset: 43) in her brother.   HOME MEDICATIONS: Allergies as of 02/23/2022       Reactions   Rifampin Nausea And Vomiting   Taxol [paclitaxel] Anaphylaxis   Paclitaxel (protein-bound) Hives   Tape Itching   Abraxane [paclitaxel Protein-bound Part] Rash   Clarithromycin Rash        Medication List        Accurate as of February 23, 2022 10:24 AM. If you have any questions, ask your nurse or doctor.          acyclovir 400 MG tablet Commonly known as: ZOVIRAX TAKE 1 TABLET BY MOUTH TWICE DAILY   albuterol 108 (90 Base) MCG/ACT inhaler Commonly known as: VENTOLIN HFA INHALE 2 PUFFS INTO LUNGS EVERY 6 HOURS AS NEEDED FOR WHEEZING OR SHORTNESS OF BREATH.   ascorbic acid 500 MG tablet Commonly known as: VITAMIN C Take 500 mg by mouth daily.   azithromycin 500 MG tablet Commonly known as: ZITHROMAX Take 500 mg by mouth daily.   CALCIUM 1200 PO Take by mouth daily.   Clofazimine Powd 100 mg by Does not apply route every morning.   D3 2000 PO Take by mouth.   ethambutol 400 MG  tablet Commonly known as: MYAMBUTOL Take 2 tablets (800 mg total) by mouth daily.   letrozole 2.5 MG tablet Commonly known as: FEMARA Take 2.5 mg by mouth daily.   meclizine 25 MG tablet Commonly known as: ANTIVERT Take 1 tablet (25 mg total) by mouth 3 (three) times daily as needed for dizziness.   rosuvastatin 10 MG tablet Commonly known as: Crestor Take 1 tablet (10 mg total) by mouth daily.          REVIEW OF SYSTEMS: A comprehensive ROS was conducted with the patient and is negative except as per HPI and below:  ROS     OBJECTIVE:  VS:  BP 114/72 (BP Location: Left Arm, Patient Position: Sitting, Cuff Size: Small)   Pulse 72   Ht 5' 6" (1.676 m)   Wt 110 lb 12.8 oz (50.3 kg)   SpO2 99%   BMI 17.88 kg/m    Wt Readings from Last 3 Encounters:  02/23/22 110 lb 12.8 oz (50.3 kg)  02/08/22 111 lb 12.8 oz (50.7 kg)  11/02/21 110 lb 11.2 oz (50.2 kg)     EXAM: General: Pt appears well and is in NAD  Neck: General: Supple without adenopathy. Thyroid: Thyroid size normal.  No goiter or nodules appreciated. No thyroid bruit.  Lungs: Clear with good BS bilat with no rales, rhonchi, or wheezes  Heart: Auscultation: RRR.  Abdomen: Normoactive bowel sounds, soft, nontender, without masses or organomegaly palpable  Extremities:  BL LE: No pretibial edema normal ROM and strength.  Mental Status: Judgment, insight: Intact Orientation: Oriented to time, place, and person Mood and affect: No depression, anxiety, or agitation     DATA REVIEWED:    DXA 08/02/2021   ASSESSMENT: The BMD measured at DualFemur Total Left is 0.503 g/cm2 with a T-score of -4.0. This patient is considered osteoporotic according to [facility]) criteria.   The can quality is good.   Site Region Measured Date Measured Age YA BMD Significant CHANGE T-score DualFemur Total Left 08/02/2021    69.9         -4.0    0.503 g/cm2   DualFemur Total Mean 08/02/2021    69.9          -4.0    0.504 g/cm2   AP Spine  L1-L4      08/02/2021    69.9         -3.5    0.766 g/cm2   ASSESSMENT/PLAN/RECOMMENDATIONS:   Osteoporosis :  -I have emphasized the importance of optimal calcium and vitamin D intake -  Medications : Calcium 1200 mg daily Vitamin D3 2000 IU daily Prolia 60 mg SQ every 6 months     Signed electronically by: Mack Guise, MD  [facility]  [address] [place], [place] [address] Phone: [phone] FAX: [phone]   CC: [name], MD [address] [id number] Phone: [phone] Fax: [phone]   Return to Endocrinology clinic as below: Future Appointments  Date Time Provider [place]  02/23/2022 10:30 AM Jiaire Rosebrook, Melanie Crazier, MD [facility] None  03/10/2022 10:30 AM Rankin, Clent Demark, MD [facility] None  04/21/2022 11:00 AM Michel Bickers, MD [facility]  11/02/2022 11:15 AM [facility] None  11/02/2022 11:45 AM Nicholas Lose, MD [facility] None

## 2022-02-24 ENCOUNTER — Telehealth: Payer: Self-pay | Admitting: Pharmacist

## 2022-02-24 LAB — PARATHYROID HORMONE, INTACT (NO CA): PTH: 75 pg/mL (ref 16–77)

## 2022-02-24 NOTE — Telephone Encounter (Signed)
2 bottles of clofazimine are located in the pharmacy office when patient needs a refill.   Lenzi Marmo L. Jamerius Boeckman, PharmD [facility] Clinical Pharmacist Practitioner

## 2022-03-04 NOTE — Assessment & Plan Note (Signed)
Keep albuterol available to use 2 puffs if needed for shortness of breath, chest tightness, wheezing

## 2022-03-04 NOTE — Assessment & Plan Note (Signed)
Continue azithromycin, ethambutol, clofazimine as you have been taking them.  Follow with Dr. Megan Salon in infectious diseases as planned. We will plan to repeat your CT scan of the chest in July 2024.  We would consider repeating this sooner if you develop new respiratory symptoms.g. Follow Dr. Lamonte Sakai in 6 months or sooner if you have any problems.

## 2022-03-04 NOTE — Assessment & Plan Note (Signed)
Following serial imaging. No evidence recurrence.

## 2022-03-10 ENCOUNTER — Ambulatory Visit (INDEPENDENT_AMBULATORY_CARE_PROVIDER_SITE_OTHER): Payer: Medicare Other | Admitting: Ophthalmology

## 2022-03-10 ENCOUNTER — Encounter (INDEPENDENT_AMBULATORY_CARE_PROVIDER_SITE_OTHER): Payer: Self-pay | Admitting: Ophthalmology

## 2022-03-10 DIAGNOSIS — H33101 Unspecified retinoschisis, right eye: Secondary | ICD-10-CM | POA: Diagnosis not present

## 2022-03-10 DIAGNOSIS — H43813 Vitreous degeneration, bilateral: Secondary | ICD-10-CM | POA: Diagnosis not present

## 2022-03-10 DIAGNOSIS — H35371 Puckering of macula, right eye: Secondary | ICD-10-CM

## 2022-03-10 DIAGNOSIS — H2511 Age-related nuclear cataract, right eye: Secondary | ICD-10-CM | POA: Diagnosis not present

## 2022-03-10 NOTE — Progress Notes (Signed)
03/10/2022     CHIEF COMPLAINT Patient presents for  Chief Complaint  Patient presents with   Retina Evaluation      HISTORY OF PRESENT ILLNESS: Anna Valentine is a 71 y.o. female who presents to the clinic today for:   HPI   1 year fu ou oct Pt states her vision has been stable Pt denies any new floater or FOL Last edited by Morene Rankins, CMA on 03/10/2022 11:03 AM.      Referring physician: Monna Fam, MD [place],  [place] [address]  HISTORICAL INFORMATION:   Selected notes from the [facility]: No current outpatient medications on file. (Ophthalmic Drugs)   No current facility-administered medications for this visit. (Ophthalmic Drugs)   Current Outpatient Medications (Other)  Medication Sig   acyclovir (ZOVIRAX) 400 MG tablet TAKE 1 TABLET BY MOUTH TWICE DAILY   albuterol (VENTOLIN HFA) 108 (90 Base) MCG/ACT inhaler INHALE 2 PUFFS INTO LUNGS EVERY 6 HOURS AS NEEDED FOR WHEEZING OR SHORTNESS OF BREATH.   azithromycin (ZITHROMAX) 500 MG tablet Take 500 mg by mouth daily.   Calcium Carbonate-Vit D-Min (CALCIUM 1200 PO) Take by mouth daily.   Cholecalciferol (D3 2000 PO) Take by mouth.   Clofazimine POWD 100 mg by Does not apply route every morning.   ethambutol (MYAMBUTOL) 400 MG tablet Take 2 tablets (800 mg total) by mouth daily.   letrozole (FEMARA) 2.5 MG tablet Take 2.5 mg by mouth daily.   meclizine (ANTIVERT) 25 MG tablet Take 1 tablet (25 mg total) by mouth 3 (three) times daily as needed for dizziness.   rosuvastatin (CRESTOR) 10 MG tablet Take 1 tablet (10 mg total) by mouth daily.   vitamin C (ASCORBIC ACID) 500 MG tablet Take 500 mg by mouth daily.   No current facility-administered medications for this visit. (Other)      REVIEW OF SYSTEMS: ROS   Negative for: Constitutional, Gastrointestinal, Neurological, Skin, Genitourinary, Musculoskeletal, HENT, Endocrine, Cardiovascular,  Eyes, Respiratory, Psychiatric, Allergic/Imm, Heme/Lymph Last edited by Morene Rankins, CMA on 03/10/2022 11:03 AM.       ALLERGIES Allergies  Allergen Reactions   Rifampin Nausea And Vomiting   Taxol [Paclitaxel] Anaphylaxis   Paclitaxel (Protein-Bound) Hives   Tape Itching   Abraxane [Paclitaxel Protein-Bound Part] Rash   Clarithromycin Rash    PAST MEDICAL HISTORY Past Medical History:  Diagnosis Date   Anemia    hx of IDA   BRCA negative 2011   BRCA I/ II negative   Breast cancer ([place]) 08/2009   LEFT=stage 2, rx with lumpectomy and xrt   Breast cancer ([place]) 2019   RIGHT   Bronchiectasis ([place])    Chronic diastolic CHF (congestive heart failure) ([place]) 02/20/2020   Constipation    Essential hypertension 02/20/2020   Family history of adverse reaction to anesthesia    My Sister has nausea   Family history of breast cancer    Family history of colon cancer    Family history of lung cancer    GERD (gastroesophageal reflux disease)    not presently having symptoms   Hypertension    Iatrogenic pneumothorax 02/06/2019   Lung cancer ([place]) 06/06/05   stage 1 poorly differentiated adenocarcinoma, s/p right lower lobectomy.   Mycobacterium avium complex [facility])    dx 2021   Osteopenia 09/13/2013   Osteoporosis    Personal history of lung cancer    Pneumonia  [id number], walking pnemonia - 2009 ish   STD (sexually transmitted disease)    HSV   Vitamin D deficiency 09/13/2013   Past Surgical History:  Procedure Laterality Date   BREAST BIOPSY     BREAST LUMPECTOMY  10/12/2009   Left lumpectomy and radiation, stage II, ER/PR+, Her 2 nu negative   BUNIONECTOMY Bilateral    COLONOSCOPY  2018   SA-MAC-suprep-no polyps   COLONOSCOPY     LOBECTOMY Right 06/06/2005   [facility]   MASTECTOMY W/ SENTINEL NODE BIOPSY Bilateral 03/15/2018   MASTECTOMY W/ SENTINEL NODE BIOPSY Bilateral 03/15/2018   Procedure: BILATERAL TOTAL MASTECTOMIES WITH RIGHT SENTINEL LYMPH NODE BIOPSY;  Surgeon:  Rolm Bookbinder, MD;  Location: [facility];  Service: General;  Laterality: Bilateral;   PORTACATH PLACEMENT N/A 03/15/2018   Procedure: INSERTION PORT-A-CATH WITH [place];  Surgeon: Rolm Bookbinder, MD;  Location: [facility];  Service: General;  Laterality: N/A;   [place]  02/06/2019   Flexible video fiberoptic bronchoscopy with electromagnetic navigation and biopsies.   VIDEO BRONCHOSCOPY WITH ENDOBRONCHIAL NAVIGATION Left 02/06/2019   Procedure: VIDEO BRONCHOSCOPY WITH ENDOBRONCHIAL NAVIGATION, left lung;  Surgeon: Collene Gobble, MD;  [facility];  Service: Thoracic;  Laterality: Left;   VIDEO BRONCHOSCOPY WITH ENDOBRONCHIAL NAVIGATION N/A 01/08/2020   Procedure: VIDEO BRONCHOSCOPY WITH ENDOBRONCHIAL NAVIGATION;  Surgeon: Collene Gobble, MD;  [facility];  Service: Thoracic;  Laterality: N/A;    FAMILY HISTORY Family History  Problem Relation Age of Onset   Allergies Mother    Asthma Mother    Lung cancer Mother    Breast cancer Mother 43       recurrence age 7   Colon cancer Father 98   Colon polyps Father 67   Prostate cancer Brother 53   Breast cancer Sister 72       Recurrence age 35 BRCA negative   Colon polyps Sister    Leukemia Sister    Breast cancer Sister 54   Prostate cancer Brother 72   Breast cancer Maternal Grandmother 68   Colon cancer Maternal Aunt    Leukemia Maternal Grandfather    Lung cancer Maternal Aunt    Breast cancer Cousin    Esophageal cancer Neg Hx    Rectal cancer Neg Hx    Stomach cancer Neg Hx     SOCIAL HISTORY Social History   Tobacco Use   Smoking status: Former    Packs/day: 2.00    Years: 18.00    Total pack years: 36.00    Types: Cigarettes    Quit date: 07/12/1987    Years since quitting: 34.6   Smokeless tobacco: Never  Vaping Use   Vaping Use: Never used  Substance Use Topics   Alcohol use: Yes    Comment: occasionally   Drug use: No         OPHTHALMIC  EXAM:  Base Eye Exam     Visual Acuity (ETDRS)       Right Left   Dist cc 20/25 +3 20/20 -1    Correction: Glasses         Tonometry (Tonopen, 11:07 AM)       Right Left   Pressure 15 16         Pupils       Pupils   Right PERRL   Left PERRL         Visual Fields  Left Right    Full          Extraocular Movement       Right Left    Ortho Ortho    -- -- --  --  --  -- -- --   -- -- --  --  --  -- -- --           Neuro/Psych     Oriented x3: Yes   Mood/Affect: Normal         Dilation     Both eyes: 1.0% Mydriacyl, 2.5% Phenylephrine @ 11:04 AM           Slit Lamp and Fundus Exam     External Exam       Right Left   External Normal Normal         Slit Lamp Exam       Right Left   Lids/Lashes Normal Normal   Conjunctiva/Sclera White and quiet White and quiet   Cornea Clear Clear   Anterior Chamber Deep and quiet Deep and quiet   Iris Round and reactive Round and reactive   Lens 2+ Nuclear sclerosis 2+ Nuclear sclerosis   Anterior Vitreous Normal Normal         Fundus Exam       Right Left   Posterior Vitreous Posterior vitreous detachment Posterior vitreous detachment   Disc Normal Normal   C/D Ratio 0.25 0.3   Macula Epiretinal membrane, minor topo distortion Normal   Vessels Normal Normal   Periphery Normal Normal            IMAGING AND PROCEDURES  Imaging and Procedures for 03/10/22  OCT, Retina - OU - Both Eyes                   ASSESSMENT/PLAN:  Posterior vitreous detachment of both eyes Physiologic OU no holes or tears  Nuclear sclerotic cataract of right eye Follow-up Dr. 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 Right epiretinal membrane  H35.371 OCT, Retina - OU -  Both Eyes    2. Posterior vitreous detachment of both eyes  H43.813     3. Nuclear sclerotic cataract of right eye  H25.11     4. Right retinoschisis  H33.101       1.  Patient instructed in monocular visual acuity testing particularly reading vision to determine if the right eye develops any difficulties if so they would be on a very gradual basis.  For this reason follow-up here in 2 years  2.  3.  Ophthalmic Meds Ordered this visit:  No orders of the defined types were placed in this encounter.      Return in about 2 years (around 03/10/2024) for DILATE OU, OCT.  There are no Patient Instructions on file for this visit.   Explained the diagnoses, plan, and follow up with the patient and they expressed understanding.  Patient expressed understanding of the importance of proper follow up care.   Clent Demark Rankin M.D. Diseases & Surgery of the Retina and Vitreous Retina & Diabetic [place] 03/10/22     Abbreviations: M myopia (nearsighted); A astigmatism; H hyperopia (farsighted); P presbyopia; Mrx spectacle prescription;  CTL contact lenses; OD right eye; OS left eye; OU both eyes  XT exotropia; ET esotropia; PEK punctate epithelial keratitis; PEE punctate epithelial erosions; DES dry eye syndrome; MGD meibomian gland dysfunction; ATs artificial tears; PFAT's preservative free artificial tears; [place] nuclear sclerotic cataract; PSC posterior subcapsular cataract; ERM epi-retinal membrane; PVD posterior vitreous detachment; RD retinal detachment; DM diabetes mellitus; DR diabetic retinopathy; NPDR non-proliferative diabetic retinopathy; PDR proliferative diabetic retinopathy; CSME clinically significant macular edema; DME diabetic macular edema; dbh dot blot hemorrhages; CWS cotton wool spot; POAG primary open angle glaucoma; C/D cup-to-disc ratio; HVF humphrey visual field; GVF goldmann visual field; OCT optical coherence tomography; IOP intraocular pressure; BRVO Branch retinal  vein occlusion; CRVO central retinal vein occlusion; CRAO central retinal artery occlusion; BRAO branch retinal artery occlusion; RT retinal tear; SB scleral buckle; PPV pars plana vitrectomy; VH Vitreous hemorrhage; PRP panretinal laser photocoagulation; IVK intravitreal kenalog; VMT vitreomacular traction; MH Macular hole;  NVD neovascularization of the disc; NVE neovascularization elsewhere; AREDS age related eye disease study; ARMD age related macular degeneration; POAG primary open angle glaucoma; EBMD epithelial/anterior basement membrane dystrophy; ACIOL anterior chamber intraocular lens; IOL intraocular lens; PCIOL posterior chamber intraocular lens; Phaco/IOL phacoemulsification with intraocular lens placement; [place] photorefractive keratectomy; LASIK laser assisted in situ keratomileusis; HTN hypertension; DM diabetes mellitus; COPD chronic obstructive pulmonary disease

## 2022-03-10 NOTE — Assessment & Plan Note (Addendum)
Physiologic OU no holes or tears

## 2022-03-10 NOTE — Assessment & Plan Note (Signed)
Minor secondary to ERM observe

## 2022-03-10 NOTE — Assessment & Plan Note (Signed)
Moderate perifoveal appearance anatomically by OCT but no impact on foveal function no impact on acuity observe

## 2022-03-10 NOTE — Assessment & Plan Note (Signed)
Follow-up Dr. Herbert Deaner of [facility] eye care as scheduled

## 2022-03-17 DIAGNOSIS — H2513 Age-related nuclear cataract, bilateral: Secondary | ICD-10-CM | POA: Diagnosis not present

## 2022-03-17 DIAGNOSIS — H40013 Open angle with borderline findings, low risk, bilateral: Secondary | ICD-10-CM | POA: Diagnosis not present

## 2022-03-17 DIAGNOSIS — H25041 Posterior subcapsular polar age-related cataract, right eye: Secondary | ICD-10-CM | POA: Diagnosis not present

## 2022-03-17 DIAGNOSIS — H25013 Cortical age-related cataract, bilateral: Secondary | ICD-10-CM | POA: Diagnosis not present

## 2022-04-04 ENCOUNTER — Encounter: Payer: Self-pay | Admitting: Internal Medicine

## 2022-04-13 ENCOUNTER — Other Ambulatory Visit: Payer: Self-pay | Admitting: Internal Medicine

## 2022-04-15 DIAGNOSIS — Z23 Encounter for immunization: Secondary | ICD-10-CM | POA: Diagnosis not present

## 2022-04-21 ENCOUNTER — Other Ambulatory Visit: Payer: Self-pay

## 2022-04-21 ENCOUNTER — Ambulatory Visit (INDEPENDENT_AMBULATORY_CARE_PROVIDER_SITE_OTHER): Payer: Medicare Other | Admitting: Internal Medicine

## 2022-04-21 ENCOUNTER — Ambulatory Visit: Payer: Medicare Other | Admitting: Internal Medicine

## 2022-04-21 ENCOUNTER — Encounter: Payer: Self-pay | Admitting: Internal Medicine

## 2022-04-21 DIAGNOSIS — A310 Pulmonary mycobacterial infection: Secondary | ICD-10-CM | POA: Diagnosis not present

## 2022-04-21 DIAGNOSIS — H9103 Ototoxic hearing loss, bilateral: Secondary | ICD-10-CM

## 2022-04-21 DIAGNOSIS — R197 Diarrhea, unspecified: Secondary | ICD-10-CM | POA: Diagnosis not present

## 2022-04-21 NOTE — Assessment & Plan Note (Addendum)
Although we have not been able to document microbiologic improvement she has had good clinical improvement and her's recent CT scan shows no progression of her pneumonia.  She will start an over-the-counter probiotic and get an updated COVID-vaccine as well as an RSV vaccine at her pharmacy in the near future.  She has appropriate concerns that she may be having some problems tolerating her antibiotics.  I agree with her that her hearing loss may be related to azithromycin and she may be having progressive antibiotic associated diarrhea.  The options for an alternative regimen are quite limited given her prior intolerance of rifampin and the fact that her isolate is resistant to flora quinolones.  I outlined 2 options with her today including stopping all 3 antibiotics she is currently on and observing closely off of antibiotics versus continuing clofazimine and ethambutol, discontinuing azithromycin and rechallenging with erythromycin.  After some discussion I recommended the first option.  She agrees with stopping all antibiotics now.  I will have her follow-up in 1 month to closely evaluate her respiratory symptoms, hearing and diarrhea.  Mycobacterium avium susceptibilities  02/06/2019 Organism ID [id number]   Comment: Mycobacterium avium complex  Amikacin 16.0 ug/mL   Clarithromycin 4.0 ug/mL Susceptible   Linezolid 32.0 ug/mL   Moxifloxacin 8.0 ug/mL   Streptomycin 64.0 ug/mL   Please note: Comment    01/08/2020 Organism ID [id number]   Comment: Mycobacterium avium complex  Amikacin Comment   Comment: 16.0 ug/mL Susceptible  Clarithromycin 4.0 ug/mL Susceptible   Linezolid 64.0 ug/mL Resistant   Moxifloxacin 4.0 ug/mL Resistant   Streptomycin 64.0 ug/mL

## 2022-04-21 NOTE — Assessment & Plan Note (Signed)
I am concerned that her progressive hearing loss is due to azithromycin.  It is bilateral and only began after starting azithromycin last year.  She will stop all 3 of her antibiotics now and follow-up in 1 month.

## 2022-04-21 NOTE — Assessment & Plan Note (Signed)
She will bring in a stool specimen for C. difficile testing.

## 2022-04-21 NOTE — Progress Notes (Signed)
[facility] for Infectious Disease  Patient Active Problem List   Diagnosis Date Noted   Mycobacterium avium infection ([place]) 02/11/2020    Priority: High   Hearing loss 11/04/2020    Priority: Medium    Intractable nausea and vomiting 02/20/2020    Priority: Medium    Weight loss, non-intentional 09/20/2019    Priority: Medium    Diarrhea 04/21/2022   Posterior vitreous detachment of both eyes 03/10/2022   Localized osteoporosis with current pathological fracture 08/25/2021   Skin rash 02/28/2020   Hyponatremia    Dehydration with hyponatremia 02/20/2020   Low back pain 02/20/2020   Chronic diastolic CHF (congestive heart failure) ([place]) 02/20/2020   Essential hypertension 26/94/8546   Acute metabolic encephalopathy 27/09/5007   Right epiretinal membrane 01/30/2020   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   Nuclear sclerotic cataract of right eye 01/30/2020   Nuclear sclerotic cataract of left eye 01/30/2020   Iron deficiency anemia 01/20/2020   Malignant neoplasm of overlapping sites of right breast in female, estrogen receptor positive ([place]) 09/20/2019   Postprocedural pneumothorax    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   Port-A-Cath in place 04/26/2018   Genetic testing 02/20/2018   Family history of breast cancer    Family history of lung cancer    Personal history of lung cancer    Chest pain 11/28/2017   Abnormal CT of the chest 11/30/2016   Malignant neoplasm of upper-inner quadrant of left breast in female, estrogen receptor positive ([place]) 06/16/2014   Osteopenia 09/13/2013   Postmenopausal atrophic vaginitis 09/13/2013   Vitamin D deficiency 09/13/2013   Malignant neoplasm of lower lobe of right lung ([place]) 07/24/2012   Anxiety 03/11/2011   History of neutropenia 03/11/2011   Family history of colon cancer 03/11/2011   Bronchiectasis without complication ([place]) 38/18/2993    Patient's Medications  New Prescriptions   No medications on file  Previous  Medications   ACYCLOVIR (ZOVIRAX) 400 MG TABLET    TAKE 1 TABLET BY MOUTH TWICE DAILY   ALBUTEROL (VENTOLIN HFA) 108 (90 BASE) MCG/ACT INHALER    INHALE 2 PUFFS INTO LUNGS EVERY 6 HOURS AS NEEDED FOR WHEEZING OR SHORTNESS OF BREATH.   CALCIUM CARBONATE-VIT D-MIN (CALCIUM 1200 PO)    Take by mouth daily.   CHOLECALCIFEROL (D3 2000 PO)    Take by mouth.   LETROZOLE (FEMARA) 2.5 MG TABLET    Take 2.5 mg by mouth daily.   MECLIZINE (ANTIVERT) 25 MG TABLET    Take 1 tablet (25 mg total) by mouth 3 (three) times daily as needed for dizziness.   ROSUVASTATIN (CRESTOR) 10 MG TABLET    Take 1 tablet (10 mg total) by mouth daily.   VITAMIN C (ASCORBIC ACID) 500 MG TABLET    Take 500 mg by mouth daily.  Modified Medications   No medications on file  Discontinued Medications   AZITHROMYCIN (ZITHROMAX) 500 MG TABLET    Take 500 mg by mouth daily.   CLOFAZIMINE POWD    100 mg by Does not apply route every morning.   ETHAMBUTOL (MYAMBUTOL) 400 MG TABLET    Take 2 tablets (800 mg total) by mouth daily.    Subjective: Anna Valentine is in for her routine follow-up visit.  She started on azithromycin, clofazimine and ethambutol in January of last year for her smoldering Mycobacterium avium pneumonia.  She had not tolerated a rifampin based regimen in 2021.  She feels like she is tolerating her medications well now.  She continues to have some mild, intermittent diarrhea.  Her cough has improved and she now has only occasional cough.  She used to bring up a small amount of brown sputum each morning but now it is clear.  She has had no change in her chronic dyspnea on exertion.  Her appetite is good.   Repeat sputum AFB cultures were positive again in May, August and December of last year and also in January of this year.  She has been unable to produce any sputum sample since that time for repeat culture overall she has been feeling much better.  A repeat chest CT scan in July and revealed:  IMPRESSION: 1. Left  lower lobectomy postsurgical changes in the right lung, unchanged from the previous exam. 2. Bilateral bronchiectasis with stable mucoid plugging bilaterally. 3. Stable compression deformities at T6, T7, and T12. 4. Coronary artery calcifications and aortic atherosclerosis. 5. Right renal cyst.  She has noted some slight increase in diarrhea recently.  She says that she has about 2 episodes of watery diarrhea daily, usually occurring right after eating.  She has not had any nausea, vomiting or abdominal pain.  She has not had any fever.  She has also had problems with progressive hearing loss.  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  She says that she has been concerned that her hearing loss might be triggered by azithromycin.    Review of Systems: Review of Systems  Constitutional:  Negative for chills, diaphoresis, fever and weight loss.  HENT:  Positive for ear pain and hearing loss. Negative for tinnitus.        She reports intermittent and very transient left ear pain.  Respiratory:  Positive for cough and shortness of breath. Negative for hemoptysis, sputum production and wheezing.   Cardiovascular:  Negative for chest pain.  Gastrointestinal:  Positive for diarrhea. Negative for abdominal pain, nausea and vomiting.  Musculoskeletal:  Positive for back pain.    Past Medical History:  Diagnosis Date   Anemia    hx of IDA   BRCA negative 2011   BRCA I/ II negative   Breast cancer ([place]) 08/2009   LEFT=stage 2, rx with lumpectomy and xrt   Breast cancer ([place]) 2019   RIGHT   Bronchiectasis ([place])    Chronic diastolic CHF (congestive heart failure) ([place]) 02/20/2020   Constipation    Essential hypertension 02/20/2020   Family history of adverse reaction to anesthesia    My Sister has nausea   Family history of breast cancer    Family history of colon cancer    Family history of lung cancer    GERD  (gastroesophageal reflux disease)    not presently having symptoms   Hypertension    Iatrogenic pneumothorax 02/06/2019   Lung cancer ([place]) 06/06/05   stage 1 poorly differentiated adenocarcinoma, s/p right lower lobectomy.   Mycobacterium avium complex ([place])    dx 2021   Osteopenia 09/13/2013   Osteoporosis    Personal history of lung cancer    Pneumonia    2007ish, walking pnemonia - 2009 ish   STD (sexually transmitted disease)    HSV   Vitamin D deficiency 09/13/2013    Social History   Tobacco Use   Smoking status: Former    Packs/day: 2.00    Years: 18.00    Total pack years: 36.00    Types: Cigarettes    Quit date: 07/12/1987  Years since quitting: 34.8   Smokeless tobacco: Never  Vaping Use   Vaping Use: Never used  Substance Use Topics   Alcohol use: Yes    Comment: occasionally   Drug use: No    Family History  Problem Relation Age of Onset   Allergies Mother    Asthma Mother    Lung cancer Mother    Breast cancer Mother 8       recurrence age 51   Colon cancer Father 84   Colon polyps Father 31   Prostate cancer Brother 82   Breast cancer Sister 17       Recurrence age 45 BRCA negative   Colon polyps Sister    Leukemia Sister    Breast cancer Sister 2   Prostate cancer Brother 79   Breast cancer Maternal Grandmother 31   Colon cancer Maternal Aunt    Leukemia Maternal Grandfather    Lung cancer Maternal Aunt    Breast cancer Cousin    Esophageal cancer Neg Hx    Rectal cancer Neg Hx    Stomach cancer Neg Hx     Allergies  Allergen Reactions   Rifampin Nausea And Vomiting   Taxol [Paclitaxel] Anaphylaxis   Paclitaxel (Protein-Bound) Hives   Tape Itching   Abraxane [Paclitaxel Protein-Bound Part] Rash   Clarithromycin Rash    Objective: Vitals:   04/21/22 0832  BP: 125/77  Pulse: 86  Resp: 16  Temp: 97.6 F (36.4 C)  TempSrc: Oral  SpO2: 100%  Weight: 112 lb 3.2 oz (50.9 kg)  Height: _0  (1.676 m)   Body mass index is 18.11  kg/m.  Physical Exam Constitutional:      Comments: She is in good spirits.  Her weight is stable.  Cardiovascular:     Rate and Rhythm: Normal rate and regular rhythm.     Heart sounds: No murmur heard. Pulmonary:     Effort: Pulmonary effort is normal.     Breath sounds: Normal breath sounds. No wheezing, rhonchi or rales.  Psychiatric:        Mood and Affect: Mood normal.     Lab Results Sputum AFB smear 06/16/2021: 3+ AFB Sputum AFB culture 06/16/2021: Mycobacterium avium  Problem List Items Addressed This Visit       High   Mycobacterium avium infection ([place])    Although we have not been able to document microbiologic improvement she has had good clinical improvement and her's recent CT scan shows no progression of her pneumonia.  She will start an over-the-counter probiotic and get an updated COVID-vaccine as well as an RSV vaccine at her pharmacy in the near future.  She has appropriate concerns that she may be having some problems tolerating her antibiotics.  I agree with her that her hearing loss may be related to azithromycin and she may be having progressive antibiotic associated diarrhea.  The options for an alternative regimen are quite limited given her prior intolerance of rifampin and the fact that her isolate is resistant to flora quinolones.  I outlined 2 options with her today including stopping all 3 antibiotics she is currently on and observing closely off of antibiotics versus continuing clofazimine and ethambutol, discontinuing azithromycin and rechallenging with erythromycin.  After some discussion I recommended the first option.  She agrees with stopping all antibiotics now.  I will have her follow-up in 1 month to closely evaluate her respiratory symptoms, hearing and diarrhea.  Mycobacterium avium susceptibilities  02/06/2019 Organism ID Comment Abnormal  Comment: Mycobacterium avium complex  Amikacin 16.0 ug/mL   Clarithromycin 4.0 ug/mL Susceptible    Linezolid 32.0 ug/mL   Moxifloxacin 8.0 ug/mL   Streptomycin 64.0 ug/mL   Please note: Comment    01/08/2020 Organism ID Comment Abnormal    Comment: Mycobacterium avium complex  Amikacin Comment   Comment: 16.0 ug/mL Susceptible  Clarithromycin 4.0 ug/mL Susceptible   Linezolid 64.0 ug/mL Resistant   Moxifloxacin 4.0 ug/mL Resistant   Streptomycin 64.0 ug/mL          Relevant Orders   MYCOBACTERIA, CULTURE, WITH FLUOROCHROME SMEAR     Medium    Hearing loss    I am concerned that her progressive hearing loss is due to azithromycin.  It is bilateral and only began after starting azithromycin last year.  She will stop all 3 of her antibiotics now and follow-up in 1 month.        Unprioritized   Diarrhea    She will bring in a stool specimen for C. difficile testing.      Relevant Orders   Clostridium Difficile by PCR   Michel Bickers, MD [facility] for [facility] [phone] pager   [phone] cell 04/21/2022, 11:11 AM

## 2022-05-03 ENCOUNTER — Other Ambulatory Visit: Payer: Self-pay

## 2022-05-03 ENCOUNTER — Other Ambulatory Visit: Payer: Medicare Other

## 2022-05-03 DIAGNOSIS — A310 Pulmonary mycobacterial infection: Secondary | ICD-10-CM | POA: Diagnosis not present

## 2022-05-03 NOTE — Addendum Note (Signed)
Addended by: Caffie Pinto on: 05/03/2022 11:50 AM   Modules accepted: Orders

## 2022-05-12 ENCOUNTER — Telehealth: Payer: Self-pay | Admitting: Pharmacist

## 2022-05-12 NOTE — Telephone Encounter (Signed)
2 bottles of clofazimine are located in the pharmacy office when/if patient needs a refill. She stopped all antibiotics after her office visit with Dr. Megan Salon on 04/21/22. If she restarts, clofazimine is available in our office for her.  Elijan Googe L. Inetha Maret, PharmD, BCIDP, AAHIVP, CPP Clinical Pharmacist Practitioner Infectious Diseases [facility] for Infectious Disease 05/12/2022, 10:19 AM

## 2022-05-19 ENCOUNTER — Encounter: Payer: Self-pay | Admitting: Internal Medicine

## 2022-05-19 ENCOUNTER — Ambulatory Visit (INDEPENDENT_AMBULATORY_CARE_PROVIDER_SITE_OTHER): Payer: Medicare Other

## 2022-05-19 ENCOUNTER — Other Ambulatory Visit: Payer: Self-pay | Admitting: *Deleted

## 2022-05-19 ENCOUNTER — Other Ambulatory Visit: Payer: Self-pay

## 2022-05-19 ENCOUNTER — Ambulatory Visit (INDEPENDENT_AMBULATORY_CARE_PROVIDER_SITE_OTHER): Payer: Medicare Other | Admitting: Internal Medicine

## 2022-05-19 VITALS — BP 145/82 | HR 90 | Ht 66.0 in | Wt 112.0 lb

## 2022-05-19 DIAGNOSIS — A310 Pulmonary mycobacterial infection: Secondary | ICD-10-CM | POA: Diagnosis not present

## 2022-05-19 DIAGNOSIS — H9103 Ototoxic hearing loss, bilateral: Secondary | ICD-10-CM

## 2022-05-19 DIAGNOSIS — J479 Bronchiectasis, uncomplicated: Secondary | ICD-10-CM

## 2022-05-19 DIAGNOSIS — Z23 Encounter for immunization: Secondary | ICD-10-CM | POA: Diagnosis present

## 2022-05-19 MED ORDER — LETROZOLE 2.5 MG PO TABS: 2.5000 mg | ORAL_TABLET | Freq: Every day | ORAL | 3 refills | Status: DC

## 2022-05-19 NOTE — Assessment & Plan Note (Signed)
She will follow-up with her pulmonologist, Dr. Lamonte Sakai, next month to focus on optimizing her underlying lung function and pulmonary toilet.

## 2022-05-19 NOTE — Assessment & Plan Note (Signed)
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  She seems to have had some slight improvement since stopping azithromycin.  She will stay off of antibiotics for now and follow-up with her audiologist in 3 months.

## 2022-05-19 NOTE — Progress Notes (Signed)
[facility] for Infectious Disease  Patient Active Problem List   Diagnosis Date Noted   Mycobacterium avium infection ([place]) 02/11/2020    Priority: High   Hearing loss 11/04/2020    Priority: Medium    Intractable nausea and vomiting 02/20/2020    Priority: Medium    Weight loss, non-intentional 09/20/2019    Priority: Medium    Diarrhea 04/21/2022   Posterior vitreous detachment of both eyes 03/10/2022   Localized osteoporosis with current pathological fracture 08/25/2021   Skin rash 02/28/2020   Hyponatremia    Dehydration with hyponatremia 02/20/2020   Low back pain 02/20/2020   Chronic diastolic CHF (congestive heart failure) ([place]) 02/20/2020   Essential hypertension 33/29/5188   Acute metabolic encephalopathy 41/66/0630   Right epiretinal membrane 01/30/2020   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   Nuclear sclerotic cataract of right eye 01/30/2020   Nuclear sclerotic cataract of left eye 01/30/2020   Iron deficiency anemia 01/20/2020   Malignant neoplasm of overlapping sites of right breast in female, estrogen receptor positive ([place]) 09/20/2019   Postprocedural pneumothorax    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   Port-A-Cath in place 04/26/2018   Genetic testing 02/20/2018   Family history of breast cancer    Family history of lung cancer    Personal history of lung cancer    Chest pain 11/28/2017   Abnormal CT of the chest 11/30/2016   Malignant neoplasm of upper-inner quadrant of left breast in female, estrogen receptor positive ([place]) 06/16/2014   Osteopenia 09/13/2013   Postmenopausal atrophic vaginitis 09/13/2013   Vitamin D deficiency 09/13/2013   Malignant neoplasm of lower lobe of right lung ([place]) 07/24/2012   Anxiety 03/11/2011   History of neutropenia 03/11/2011   Family history of colon cancer 03/11/2011   Bronchiectasis without complication ([place]) 16/07/930    Patient's Medications  New Prescriptions   No medications on file  Previous  Medications   ACYCLOVIR (ZOVIRAX) 400 MG TABLET    TAKE 1 TABLET BY MOUTH TWICE DAILY   ALBUTEROL (VENTOLIN HFA) 108 (90 BASE) MCG/ACT INHALER    INHALE 2 PUFFS INTO LUNGS EVERY 6 HOURS AS NEEDED FOR WHEEZING OR SHORTNESS OF BREATH.   CALCIUM CARBONATE-VIT D-MIN (CALCIUM 1200 PO)    Take by mouth daily.   CHOLECALCIFEROL (D3 2000 PO)    Take by mouth.   LETROZOLE (FEMARA) 2.5 MG TABLET    Take 2.5 mg by mouth daily.   MECLIZINE (ANTIVERT) 25 MG TABLET    Take 1 tablet (25 mg total) by mouth 3 (three) times daily as needed for dizziness.   ROSUVASTATIN (CRESTOR) 10 MG TABLET    Take 1 tablet (10 mg total) by mouth daily.   VITAMIN C (ASCORBIC ACID) 500 MG TABLET    Take 500 mg by mouth daily.  Modified Medications   No medications on file  Discontinued Medications   No medications on file    Subjective: Anna Valentine is in for her routine follow-up visit.  She was diagnosed with Mycobacterium avium pneumonia and started on azithromycin, ethambutol and rifampin in 2021.  She was intolerant of rifampin with severe GI distress and was briefly hospitalized.  She improved with stopping antibiotic therapy at that time.  She started on a salvage regimen of azithromycin, ethambutol and clofazimine in January 2022.  She had significant symptomatic improvement.  A CT scan this past July showed stable abnormalities with no progression of her pulmonary findings.  However, she remained culture positive and started  to have hearing loss bilaterally, presumed related to azithromycin.  She also started having worsening diarrhea.  I had her stop taking her antibiotics on 04/21/2022.  Her diarrhea resolved promptly.  She says that she had been having a very muffled sound like she was wearing headphones in addition to her hearing loss.  That muffled sound has almost nearly resolved.  So far, she has not noted much improvement in her bilateral hearing loss.  She is scheduled to follow-up with her pulmonologist next month and  her audiologist next February.  She has not noted any increase in her chronic cough but says that when she does cough she may bring up slightly more sputum.  She has not noted any worsening of her chronic dyspnea on exertion.  She has not had any hemoptysis, fever or night sweats but says there has been some occasions when she felt more warm and than normal.  Her appetite is good and unchanged.  Review of Systems: Review of Systems  Constitutional:  Negative for chills, diaphoresis, fever and weight loss.  HENT:  Positive for hearing loss. Negative for ear pain and tinnitus.   Respiratory:  Positive for cough, sputum production and shortness of breath. Negative for hemoptysis and wheezing.   Gastrointestinal:  Negative for abdominal pain, diarrhea, nausea and vomiting.    Past Medical History:  Diagnosis Date   Anemia    hx of IDA   BRCA negative 2011   BRCA I/ II negative   Breast cancer ([place]) 08/2009   LEFT=stage 2, rx with lumpectomy and xrt   Breast cancer ([place]) 2019   RIGHT   Bronchiectasis ([place])    Chronic diastolic CHF (congestive heart failure) ([place]) 02/20/2020   Constipation    Essential hypertension 02/20/2020   Family history of adverse reaction to anesthesia    My Sister has nausea   Family history of breast cancer    Family history of colon cancer    Family history of lung cancer    GERD (gastroesophageal reflux disease)    not presently having symptoms   Hypertension    Iatrogenic pneumothorax 02/06/2019   Lung cancer ([place]) 06/06/05   stage 1 poorly differentiated adenocarcinoma, s/p right lower lobectomy.   Mycobacterium avium complex ([place])    dx 2021   Osteopenia 09/13/2013   Osteoporosis    Personal history of lung cancer    Pneumonia    2007ish, walking pnemonia - 2009 ish   STD (sexually transmitted disease)    HSV   Vitamin D deficiency 09/13/2013    Social History   Tobacco Use   Smoking status: Former    Packs/day: 2.00    Years: 18.00    Total  pack years: 36.00    Types: Cigarettes    Quit date: 07/12/1987    Years since quitting: 34.8   Smokeless tobacco: Never  Vaping Use   Vaping Use: Never used  Substance Use Topics   Alcohol use: Yes    Comment: occasionally   Drug use: No    Family History  Problem Relation Age of Onset   Allergies Mother    Asthma Mother    Lung cancer Mother    Breast cancer Mother 36       recurrence age 76   Colon cancer Father 68   Colon polyps Father 62   Prostate cancer Brother 70   Breast cancer Sister 60       Recurrence age 37 BRCA negative   Colon polyps  Sister    Leukemia Sister    Breast cancer Sister 43   Prostate cancer Brother 50   Breast cancer Maternal Grandmother 79   Colon cancer Maternal Aunt    Leukemia Maternal Grandfather    Lung cancer Maternal Aunt    Breast cancer Cousin    Esophageal cancer Neg Hx    Rectal cancer Neg Hx    Stomach cancer Neg Hx     Allergies  Allergen Reactions   Rifampin Nausea And Vomiting   Taxol [Paclitaxel] Anaphylaxis   Paclitaxel (Protein-Bound) Hives   Tape Itching   Abraxane [Paclitaxel Protein-Bound Part] Rash   Clarithromycin Rash    Objective: Vitals:   05/19/22 1003  BP: (!) 145/82  Pulse: 90  Weight: 112 lb (50.8 kg)  Height: _0  (1.676 m)   Body mass index is 18.08 kg/m.  Physical Exam Constitutional:      Comments: She is pleasant and in no distress.  Cardiovascular:     Rate and Rhythm: Normal rate.  Pulmonary:     Effort: Pulmonary effort is normal.  Psychiatric:        Mood and Affect: Mood normal.     Lab Results Sputum AFB culture positive for Mycobacterium avium 05/03/2022   Problem List Items Addressed This Visit       High   Mycobacterium avium infection ([place]) - Primary    She has persistently colonized with Mycobacterium avium despite a long course of recent antibiotic therapy.  She does not have clear evidence of relapse but she has only been off of antibiotics for about 1 month.  I  have requested antibiotic susceptibility testing on her isolate.  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  She seems to have had some slight improvement since stopping azithromycin.  She will stay off of antibiotics for now and follow-up with her audiologist in 3 months.        Unprioritized   Bronchiectasis without complication [facility])    She will follow-up with her pulmonologist, Dr. Lamonte Sakai, next month to focus on optimizing her underlying lung function and pulmonary toilet.        Michel Bickers, MD [facility] for Infectious [facility] [phone] pager   [phone] cell 05/19/2022, 11:56 AM

## 2022-05-19 NOTE — Assessment & Plan Note (Signed)
She has persistently colonized with Mycobacterium avium despite a long course of recent antibiotic therapy.  She does not have clear evidence of relapse but she has only been off of antibiotics for about 1 month.  I have requested antibiotic susceptibility testing on her isolate.  She will remain off of antibiotics and follow-up in 6 weeks

## 2022-05-30 NOTE — Telephone Encounter (Signed)
Prolia VOB initiated via [email or web address]  Last Prolia inj 12/24/21 Next Prolia inj due 06/26/22

## 2022-06-17 ENCOUNTER — Other Ambulatory Visit: Payer: Self-pay | Admitting: Internal Medicine

## 2022-06-20 ENCOUNTER — Other Ambulatory Visit: Payer: Medicare Other

## 2022-06-20 DIAGNOSIS — F419 Anxiety disorder, unspecified: Secondary | ICD-10-CM

## 2022-06-20 DIAGNOSIS — Z13220 Encounter for screening for lipoid disorders: Secondary | ICD-10-CM | POA: Diagnosis not present

## 2022-06-20 DIAGNOSIS — I10 Essential (primary) hypertension: Secondary | ICD-10-CM | POA: Diagnosis not present

## 2022-06-20 DIAGNOSIS — R5383 Other fatigue: Secondary | ICD-10-CM

## 2022-06-20 DIAGNOSIS — Z136 Encounter for screening for cardiovascular disorders: Secondary | ICD-10-CM | POA: Diagnosis not present

## 2022-06-21 LAB — CBC WITH DIFFERENTIAL/PLATELET
Absolute Monocytes: 530 cells/uL (ref 200–950)
Basophils Absolute: 20 cells/uL (ref 0–200)
Basophils Relative: 0.4 %
Eosinophils Absolute: 240 cells/uL (ref 15–500)
Eosinophils Relative: 4.8 %
HCT: 35 % (ref 35.0–45.0)
Hemoglobin: 11.7 g/dL (ref 11.7–15.5)
Lymphs Abs: 670 cells/uL — ABNORMAL LOW (ref 850–3900)
MCH: 31.7 pg (ref 27.0–33.0)
MCHC: 33.4 g/dL (ref 32.0–36.0)
MCV: 94.9 fL (ref 80.0–100.0)
MPV: 9.9 fL (ref 7.5–12.5)
Monocytes Relative: 10.6 %
Neutro Abs: 3540 cells/uL (ref 1500–7800)
Neutrophils Relative %: 70.8 %
Platelets: 284 10*3/uL (ref 140–400)
RBC: 3.69 10*6/uL — ABNORMAL LOW (ref 3.80–5.10)
RDW: 11.9 % (ref 11.0–15.0)
Total Lymphocyte: 13.4 %
WBC: 5 10*3/uL (ref 3.8–10.8)

## 2022-06-21 LAB — LIPID PANEL
Cholesterol: 173 mg/dL (ref <200)
HDL: 66 mg/dL (ref >=50)
LDL Cholesterol (Calc): 91 mg/dL (calc)
Non-HDL Cholesterol (Calc): 107 mg/dL (calc) (ref <130)
Total CHOL/HDL Ratio: 2.6 (calc) (ref <5.0)
Triglycerides: 72 mg/dL (ref <150)

## 2022-06-21 LAB — COMPLETE METABOLIC PANEL WITH GFR
AG Ratio: 1.4 (calc) (ref 1.0–2.5)
ALT: 6 U/L (ref 6–29)
AST: 14 U/L (ref 10–35)
Albumin: 3.9 g/dL (ref 3.6–5.1)
Alkaline phosphatase (APISO): 79 U/L (ref 37–153)
BUN: 14 mg/dL (ref 7–25)
CO2: 30 mmol/L (ref 20–32)
Calcium: 9.3 mg/dL (ref 8.6–10.4)
Chloride: 103 mmol/L (ref 98–110)
Creat: 0.7 mg/dL (ref 0.60–1.00)
Globulin: 2.7 g/dL (calc) (ref 1.9–3.7)
Glucose, Bld: 92 mg/dL (ref 65–99)
Potassium: 4.5 mmol/L (ref 3.5–5.3)
Sodium: 138 mmol/L (ref 135–146)
Total Bilirubin: 0.5 mg/dL (ref 0.2–1.2)
Total Protein: 6.6 g/dL (ref 6.1–8.1)
eGFR: 93 mL/min/{1.73_m2} (ref >=60)

## 2022-06-21 LAB — TSH: TSH: 2.17 mIU/L (ref 0.40–4.50)

## 2022-06-22 ENCOUNTER — Ambulatory Visit (INDEPENDENT_AMBULATORY_CARE_PROVIDER_SITE_OTHER): Payer: Medicare Other | Admitting: Internal Medicine

## 2022-06-22 ENCOUNTER — Encounter: Payer: Self-pay | Admitting: Internal Medicine

## 2022-06-22 VITALS — BP 120/60 | HR 78 | Temp 99.3°F | Ht 66.0 in | Wt 112.8 lb

## 2022-06-22 DIAGNOSIS — Z85118 Personal history of other malignant neoplasm of bronchus and lung: Secondary | ICD-10-CM | POA: Diagnosis not present

## 2022-06-22 DIAGNOSIS — Z0000 Encounter for general adult medical examination without abnormal findings: Secondary | ICD-10-CM

## 2022-06-22 DIAGNOSIS — R636 Underweight: Secondary | ICD-10-CM

## 2022-06-22 DIAGNOSIS — Z8619 Personal history of other infectious and parasitic diseases: Secondary | ICD-10-CM

## 2022-06-22 DIAGNOSIS — E7800 Pure hypercholesterolemia, unspecified: Secondary | ICD-10-CM | POA: Diagnosis not present

## 2022-06-22 DIAGNOSIS — M810 Age-related osteoporosis without current pathological fracture: Secondary | ICD-10-CM | POA: Diagnosis not present

## 2022-06-22 DIAGNOSIS — Z853 Personal history of malignant neoplasm of breast: Secondary | ICD-10-CM | POA: Diagnosis not present

## 2022-06-22 NOTE — Progress Notes (Signed)
Annual Wellness Visit     Patient: Anna Valentine, Female    DOB: 1951-02-18, 71 y.o.   MRN: [id number] Visit Date: 06/22/2022   Subjective    Anna Valentine is a 71 y.o. female who presents today for her Annual Wellness Visit.  HPI She also presents for health maintenance exam and evaluation of medical issues.  She has a complex past medical history.  She has a history of Mycobacterium AVM infection followed by infectious disease clinic.  She is also followed by Dr. Delton Coombes, pulmonologist.  History of compression deformity involving T7 vertebral body with 60% anterior height loss and has been referred to endocrinology regarding osteoporosis treatment.  Bone density study January 2023 showed T-score of -3.5 in the AP spine and -4.0 in the left femur.  Remote history of breast cancer.  History of non-small cell lung cancer in 2006.  Pathology showed 1.2 cm poorly differentiated adenocarcinoma.  Was seen at [facility] and also by Dr. Darnelle Catalan.  She does tolerate azithromycin.  In 2020 Dr. Delton Coombes discovered bronchiectasis and infection with Mycobacterium avium colonized with Pseudomonas treated with multiple antibiotics.  She subsequently developed a thick wall cavitary lesion in the posterior right upper lobe July 2021.  PET scan showed hypermetabolism in the cavitary lesion as well as the right lung parenchyma.  She was started on 3 drug therapy for Mycobacterium avium intracellulare including azithromycin, rifampin and ethambutol which led to hyponatremia and volume depletion due to nausea.  In February 2011 was diagnosed with breast cancer and underwent left lumpectomy with diagnosis of lobular carcinoma.  Sentinel node biopsy showed 1 out of 4 lymph nodes positive for invasive lobular carcinoma and she was clinically staged 2.  Tumor was ER positive PR positive HER2/neu negative.  Underwent radiation therapy which was completed in July 2011.  She subsequently underwent total bilateral mastectomies  with right sentinel lymph node biopsy September 2019.  She had right breast cancer triple positive at that time.  Genetic testing revealed positive p53 gene mutation.  Has been diagnosed with pulmonary hypertension and followed by cardiology.  History of iron deficiency and has received iron infusions.  Her appetite has been affected by chronic illnesses but she is eating better now.  Now seeing Dr. Lonzo Cloud for regarding osteoporosis.  Had thoracic compression fracture in 2022 after lifting heavy suitcase.  She has a history of open angle glaucoma with borderline findings and hypertensive retinopathy seen by Dr. Elmer Picker.  Also has puckering of macula right eye.  Now seeing Dr. Pamelia Hoit for follow-up on triple positive breast cancer with homozygous p53 mutation.  Currently on anastrozole.  Generally seen yearly.  Social history: Resides alone.  No children.  She is a widow.  Continues to drive.  Tires easily.  Quit smoking in 1991.  For family history: See dictation 02/04/2019   Review of Systems she seems a bit frail but is in good spirits.     Objective    Vitals: BP 120/60   Pulse 78   Temp 99.3 F (37.4 C) (Tympanic)   Ht 5\' 6"  (1.676 m)   Wt 112 lb 12.8 oz (51.2 kg)   SpO2 99%   BMI 18.21 kg/m   Physical Exam she looks thin and frail Skin: Warm and dry.  No cervical adenopathy, thyromegaly or carotid bruits.  TMs clear.  Pharynx is clear.  Chest clear.  Abdomen soft nondistended without hepatosplenomegaly masses or tenderness.  No lower extremity pitting edema.  Brief neurological  exam is intact without gross focal deficits.  Affect thought and judgment appear to be normal.  Most recent functional status assessment:    06/22/2022   11:15 AM  In your present state of health, do you have any difficulty performing the following activities:  Hearing? 0  Vision? 0  Difficulty concentrating or making decisions? 0  Walking or climbing stairs? 0  Dressing or bathing? 0  Doing  errands, shopping? 0  Preparing Food and eating ? N  Using the Toilet? N  In the past six months, have you accidently leaked urine? N  Do you have problems with loss of bowel control? N  Managing your Medications? N  Managing your Finances? N  Housekeeping or managing your Housekeeping? N   Most recent fall risk assessment:    06/22/2022   11:15 AM  Fall Risk   Falls in the past year? 0  Number falls in past yr: 0  Injury with Fall? 0  Risk for fall due to : No Fall Risks  Follow up Falls prevention discussed    Most recent depression screenings:    06/22/2022   11:15 AM 04/21/2022    8:33 AM  PHQ 2/9 Scores  PHQ - 2 Score 0 0   Most recent cognitive screening:    06/22/2022   11:16 AM  6CIT Screen  What Year? 0 points  What month? 0 points  What time? 0 points  Count back from 20 0 points  Months in reverse 0 points  Repeat phrase 0 points  Total Score 0 points       Assessment & Plan   BMI 18.21  History of lung cancer 2006  History of breast cancer-treated with Femara by Dr. Pamelia Hoit  Hyperlipidemia treated with Crestor by Dr. Shirlee Latch  History of Mycobacterium AVM infection with bronchiectasis followed by infectious disease clinic  Osteoporosis seen by Dr. Lonzo Cloud  History of thoracic compression fracture  Reactive airways disease treated with albuterol inhaler  Plan: Patient needs to mask when out in public due to respiratory pathogens present in the community.  Continue current medications and follow-up here in 1 year or as needed.  Has had high-dose flu vaccine and recent COVID-vaccine.  Recommend pneumococcal 20 vaccine in the near future.  Tetanus update will be due in November 2024.  Return in 1 year or as needed.    Annual wellness visit done today including the all of the following: Reviewed patient's Family Medical History Reviewed and updated list of patient's medical providers Assessment of cognitive impairment was done Assessed  patient's functional ability Established a written schedule for health screening services Health Risk Assessent Completed and Reviewed  Discussed health benefits of physical activity, and encouraged her to engage in regular exercise appropriate for her age and condition.    IMargaree Mackintosh, MD, have reviewed all documentation for this visit. The documentation on 08/04/22 for the exam, diagnosis, procedures, and orders are all accurate and complete.     IMargaree Mackintosh, MD, have reviewed all documentation for this visit. The documentation on 07/08/22 for the exam, diagnosis, procedures, and orders are all accurate and complete.   LaVon Philipp Deputy, CMA

## 2022-06-22 NOTE — Progress Notes (Incomplete)
   Subjective:    Patient ID: Anna Valentine, female    DOB: 1951-01-15, 71 y.o.   MRN: [id number]  HPI    Review of Systems     Objective:   Physical Exam        Assessment & Plan:

## 2022-06-23 ENCOUNTER — Encounter: Payer: Self-pay | Admitting: Internal Medicine

## 2022-06-23 DIAGNOSIS — Z0289 Encounter for other administrative examinations: Secondary | ICD-10-CM

## 2022-06-25 LAB — M. AVIUM MIC PANEL
AMIKACIN (LIPOSOMAL, INHALED): 32 ug/mL
AMIKACIN: 32 ug/mL
CIPROFLOXACIN: >8 ug/mL
CLARITHROMYCIN: 4 ug/mL
CLOFAZIMINE: 0.25 ug/mL
DOXYCYCLINE: >8 ug/mL
LINEZOLID: >32 ug/mL
MINOCYCLINE: >8 ug/mL
MOXIFLOXACIN: >4 ug/mL
RIFABUTIN: 2 ug/mL
RIFAMPIN: >4 ug/mL
STREPTOMYCIN: >32 ug/mL

## 2022-06-25 LAB — MYCOBACTERIA,CULT W/FLUOROCHROME SMEAR
MICRO NUMBER:: 14097001
SPECIMEN QUALITY:: ADEQUATE

## 2022-06-28 ENCOUNTER — Ambulatory Visit (INDEPENDENT_AMBULATORY_CARE_PROVIDER_SITE_OTHER): Payer: Medicare Other

## 2022-06-28 VITALS — BP 110/72 | Wt 116.0 lb

## 2022-06-28 DIAGNOSIS — M8080XS Other osteoporosis with current pathological fracture, unspecified site, sequela: Secondary | ICD-10-CM | POA: Diagnosis not present

## 2022-06-28 MED ORDER — DENOSUMAB 60 MG/ML ~~LOC~~ SOSY
60.0000 mg | PREFILLED_SYRINGE | Freq: Once | SUBCUTANEOUS | Status: AC
  Administered 2022-06-28: 60 mg via SUBCUTANEOUS

## 2022-06-28 NOTE — Progress Notes (Signed)
After obtaining consent, and per orders of Dr. Kelton Pillar, injection of Prolia 60mg   given by Jermey Closs L Sueanne Maniaci in left are SQ. Patient instructed to remain in clinic for 20 minutes afterwards, and to report any adverse reaction to me immediately.

## 2022-06-30 ENCOUNTER — Encounter: Payer: Self-pay | Admitting: Internal Medicine

## 2022-06-30 ENCOUNTER — Ambulatory Visit (INDEPENDENT_AMBULATORY_CARE_PROVIDER_SITE_OTHER): Payer: Medicare Other | Admitting: Internal Medicine

## 2022-06-30 ENCOUNTER — Other Ambulatory Visit: Payer: Self-pay

## 2022-06-30 VITALS — BP 109/75 | HR 88 | Temp 97.4°F | Resp 16 | Ht 66.0 in | Wt 112.6 lb

## 2022-06-30 DIAGNOSIS — A310 Pulmonary mycobacterial infection: Secondary | ICD-10-CM | POA: Diagnosis not present

## 2022-06-30 NOTE — Progress Notes (Signed)
[facility] for Infectious Disease  Patient Active Problem List   Diagnosis Date Noted   Mycobacterium avium infection ([place]) 02/11/2020    Priority: High   Hearing loss 11/04/2020    Priority: Medium    Intractable nausea and vomiting 02/20/2020    Priority: Medium    Weight loss, non-intentional 09/20/2019    Priority: Medium    Diarrhea 04/21/2022   Posterior vitreous detachment of both eyes 03/10/2022   Localized osteoporosis with current pathological fracture 08/25/2021   Skin rash 02/28/2020   Hyponatremia    Dehydration with hyponatremia 02/20/2020   Low back pain 02/20/2020   Chronic diastolic CHF (congestive heart failure) ([place]) 02/20/2020   Essential hypertension 28/76/8115   Acute metabolic encephalopathy 72/62/0355   Right epiretinal membrane 01/30/2020   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   Nuclear sclerotic cataract of right eye 01/30/2020   Nuclear sclerotic cataract of left eye 01/30/2020   Iron deficiency anemia 01/20/2020   Malignant neoplasm of overlapping sites of right breast in female, estrogen receptor positive ([place]) 09/20/2019   Postprocedural pneumothorax    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   Port-A-Cath in place 04/26/2018   Genetic testing 02/20/2018   Family history of breast cancer    Family history of lung cancer    Personal history of lung cancer    Chest pain 11/28/2017   Abnormal CT of the chest 11/30/2016   Malignant neoplasm of upper-inner quadrant of left breast in female, estrogen receptor positive ([place]) 06/16/2014   Osteopenia 09/13/2013   Postmenopausal atrophic vaginitis 09/13/2013   Vitamin D deficiency 09/13/2013   Malignant neoplasm of lower lobe of right lung ([place]) 07/24/2012   Anxiety 03/11/2011   History of neutropenia 03/11/2011   Family history of colon cancer 03/11/2011   Bronchiectasis without complication ([place]) 97/41/6384    Patient's Medications  New Prescriptions   No medications on file  Previous  Medications   ACYCLOVIR (ZOVIRAX) 400 MG TABLET    TAKE 1 TABLET BY MOUTH TWICE DAILY   ALBUTEROL (VENTOLIN HFA) 108 (90 BASE) MCG/ACT INHALER    INHALE 2 PUFFS INTO LUNGS EVERY 6 HOURS AS NEEDED FOR WHEEZING OR SHORTNESS OF BREATH.   CALCIUM CARBONATE-VIT D-MIN (CALCIUM 1200 PO)    Take by mouth daily.   CHOLECALCIFEROL (D3 2000 PO)    Take by mouth.   LETROZOLE (FEMARA) 2.5 MG TABLET    Take 1 tablet (2.5 mg total) by mouth daily.   ROSUVASTATIN (CRESTOR) 10 MG TABLET    Take 1 tablet (10 mg total) by mouth daily.   VITAMIN C (ASCORBIC ACID) 500 MG TABLET    Take 500 mg by mouth daily.  Modified Medications   No medications on file  Discontinued Medications   No medications on file    Subjective: Anna Valentine is in for her routine follow-up visit.  She was diagnosed with Mycobacterium avium pneumonia and started on azithromycin, ethambutol and rifampin in 2021.  She was intolerant of rifampin with severe GI distress and was briefly hospitalized.  She improved with stopping antibiotic therapy at that time.  She started on a salvage regimen of azithromycin, ethambutol and clofazimine in January 2022.  She had significant symptomatic improvement.  A CT scan this past July showed stable abnormalities with no progression of her pulmonary findings.  However, she remained culture positive and started to have hearing loss bilaterally, presumed related to azithromycin.  She also started having worsening diarrhea.  I had her stop taking her  antibiotics on 04/21/2022.  Her diarrhea resolved promptly.  She says that she had been having a very muffled sound like she was wearing headphones in addition to her hearing loss.  That muffled sound has almost nearly resolved.  So far, she has not noted much improvement in her bilateral hearing loss but her hearing loss has not gotten worse.  She is scheduled to follow-up with her audiologist next February.  She has not noted slight increase in her chronic cough and says  that when she does cough she may bring up slightly more yellow-green sputum.  Her chronic dyspnea on exertion may be slightly worse.  She has not had any hemoptysis.  She had 1 recent episode of chills with no documented fever and a separate episode of mild sweats without chills or fever. Her appetite is good and unchanged.  Her last sputum culture on 05/04/2019 through grew Mycobacterium avium again.  Her antibiotic susceptibilities are listed below.    Her last CT scan on 01/14/2022 showed stable findings.  IMPRESSION: 1. Left lower lobectomy postsurgical changes in the right lung, unchanged from the previous exam. 2. Bilateral bronchiectasis with stable mucoid plugging bilaterally. 3. Stable compression deformities at T6, T7, and T12. 4. Coronary artery calcifications and aortic atherosclerosis. 5. Right renal cyst.    Review of Systems: Review of Systems  Constitutional:  Negative for chills, diaphoresis, fever and weight loss.  HENT:  Positive for hearing loss. Negative for ear pain and tinnitus.   Respiratory:  Positive for cough, sputum production and shortness of breath. Negative for hemoptysis and wheezing.   Gastrointestinal:  Negative for abdominal pain, diarrhea, nausea and vomiting.    Past Medical History:  Diagnosis Date   Anemia    hx of IDA   BRCA negative 2011   BRCA I/ II negative   Breast cancer ([place]) 08/2009   LEFT=stage 2, rx with lumpectomy and xrt   Breast cancer ([place]) 2019   RIGHT   Bronchiectasis ([place])    Chronic diastolic CHF (congestive heart failure) ([place]) 02/20/2020   Constipation    Essential hypertension 02/20/2020   Family history of adverse reaction to anesthesia    My Sister has nausea   Family history of breast cancer    Family history of colon cancer    Family history of lung cancer    GERD (gastroesophageal reflux disease)    not presently having symptoms   Hypertension    Iatrogenic pneumothorax 02/06/2019   Lung cancer ([place]) 06/06/05    stage 1 poorly differentiated adenocarcinoma, s/p right lower lobectomy.   Mycobacterium avium complex ([place])    dx 2021   Osteopenia 09/13/2013   Osteoporosis    Personal history of lung cancer    Pneumonia    2007ish, walking pnemonia - 2009 ish   STD (sexually transmitted disease)    HSV   Vitamin D deficiency 09/13/2013    Social History   Tobacco Use   Smoking status: Former    Packs/day: 2.00    Years: 18.00    Total pack years: 36.00    Types: Cigarettes    Quit date: 07/12/1987    Years since quitting: 34.9   Smokeless tobacco: Never  Vaping Use   Vaping Use: Never used  Substance Use Topics   Alcohol use: Yes    Comment: occasionally   Drug use: No    Family History  Problem Relation Age of Onset   Allergies Mother    Asthma Mother  Lung cancer Mother    Breast cancer Mother 11       recurrence age 59   Colon cancer Father 21   Colon polyps Father 73   Prostate cancer Brother 3   Breast cancer Sister 24       Recurrence age 53 BRCA negative   Colon polyps Sister    Leukemia Sister    Breast cancer Sister 25   Prostate cancer Brother 19   Breast cancer Maternal Grandmother 37   Colon cancer Maternal Aunt    Leukemia Maternal Grandfather    Lung cancer Maternal Aunt    Breast cancer Cousin    Esophageal cancer Neg Hx    Rectal cancer Neg Hx    Stomach cancer Neg Hx     Allergies  Allergen Reactions   Rifampin Nausea And Vomiting   Taxol [Paclitaxel] Anaphylaxis   Paclitaxel (Protein-Bound) Hives   Tape Itching   Abraxane [Paclitaxel Protein-Bound Part] Rash   Clarithromycin Rash    Objective: Vitals:   06/30/22 1026  BP: 109/75  Pulse: 88  Resp: 16  Temp: (!) 97.4 F (36.3 C)  TempSrc: Oral  SpO2: 100%  Weight: 112 lb 9.6 oz (51.1 kg)  Height: _0  (1.676 m)   Body mass index is 18.17 kg/m.  Physical Exam Constitutional:      Comments: She is pleasant and in no distress.  Her weight is unchanged.  Cardiovascular:     Rate and  Rhythm: Normal rate and regular rhythm.     Heart sounds: No murmur heard. Pulmonary:     Effort: Pulmonary effort is normal.     Breath sounds: Rales present. No wheezing or rhonchi.     Comments: She has chronic, stable crackles in her right lung base posteriorly. Psychiatric:        Mood and Affect: Mood normal.     Lab Results Sputum AFB culture positive for Mycobacterium avium 05/03/2022   Problem List Items Addressed This Visit       High   Mycobacterium avium infection ([place]) - Primary    Anna Valentine has chronic, smoldering Mycobacterium avium cavitary pneumonia.  She seems to be slowly progressing after stopping antibiotics 2 months ago.  Unfortunately choosing future antibiotic regimens is complicated by some emerging resistance and prior adverse reactions to rifampin and (probably) azithromycin.  I think it is safe and reasonable to hold off on restarting therapy now.  I will request an expert opinion about future treatment options from Dr. Brigitte Pulse at [facility].  Anna Valentine will follow-up in 4 weeks.       Michel Bickers, MD [facility] [place] [facility] [phone] pager   [phone] cell 06/30/2022, 1:27 PM

## 2022-06-30 NOTE — Assessment & Plan Note (Signed)
Anna Valentine has chronic, smoldering Mycobacterium avium cavitary pneumonia.  She seems to be slowly progressing after stopping antibiotics 2 months ago.  Unfortunately choosing future antibiotic regimens is complicated by some emerging resistance and prior adverse reactions to rifampin and (probably) azithromycin.  I think it is safe and reasonable to hold off on restarting therapy now.  I will request an expert opinion about future treatment options from Dr. Brigitte Pulse at [facility].  Anna Valentine will follow-up in 4 weeks.

## 2022-07-01 ENCOUNTER — Telehealth: Payer: Self-pay

## 2022-07-01 NOTE — Telephone Encounter (Signed)
Referral sent to [facility] to Dr. Lorenda Cahill for further evaluation of M. Avium.  P: [phone] F: [phone]   Beryle Flock, RN

## 2022-07-05 NOTE — Telephone Encounter (Signed)
Last Prolia inj 06/28/22 Next Prolia inj due 12/29/22

## 2022-07-05 NOTE — Telephone Encounter (Signed)
Pt ready for scheduling on or after 06/26/22  Out-of-pocket cost due at time of visit: $301  Primary: Medicare Prolia co-insurance: 20% (approximately $276) Admin fee co-insurance: 20% (approximately $25)  Deductible: $26 of $226 met  Secondary: [employer] Medicare Supp Plan G Prolia co-insurance: Covers Medicare Part B co-insurance Admin fee co-insurance: Covers Medicare Part B co-insurance  Deductible: does not apply (does NOT cover MEDICARE deductible)  Prior Auth: NOT required PA# Valid:   ** This summary of benefits is an estimation of the patient's out-of-pocket cost. Exact cost may vary based on individual plan coverage.   

## 2022-07-06 ENCOUNTER — Other Ambulatory Visit: Payer: Medicare Other

## 2022-07-06 ENCOUNTER — Encounter: Payer: Self-pay | Admitting: Internal Medicine

## 2022-07-06 ENCOUNTER — Ambulatory Visit
Admission: RE | Admit: 2022-07-06 | Discharge: 2022-07-06 | Disposition: A | Discharge status: Home / Self Care | Payer: Medicare Other | Source: Ambulatory Visit | Attending: Internal Medicine | Admitting: Internal Medicine

## 2022-07-06 ENCOUNTER — Telehealth (INDEPENDENT_AMBULATORY_CARE_PROVIDER_SITE_OTHER): Payer: Medicare Other | Admitting: Internal Medicine

## 2022-07-06 ENCOUNTER — Telehealth: Payer: Self-pay | Admitting: Internal Medicine

## 2022-07-06 VITALS — BP 122/77 | Temp 97.8°F

## 2022-07-06 DIAGNOSIS — Z85118 Personal history of other malignant neoplasm of bronchus and lung: Secondary | ICD-10-CM | POA: Diagnosis not present

## 2022-07-06 DIAGNOSIS — Z853 Personal history of malignant neoplasm of breast: Secondary | ICD-10-CM

## 2022-07-06 DIAGNOSIS — M810 Age-related osteoporosis without current pathological fracture: Secondary | ICD-10-CM | POA: Diagnosis not present

## 2022-07-06 DIAGNOSIS — R0781 Pleurodynia: Secondary | ICD-10-CM | POA: Diagnosis not present

## 2022-07-06 DIAGNOSIS — R0789 Other chest pain: Secondary | ICD-10-CM

## 2022-07-06 MED ORDER — HYDROCODONE-ACETAMINOPHEN 5-325 MG PO TABS: ORAL_TABLET | ORAL | 0 refills | Status: DC

## 2022-07-06 NOTE — Progress Notes (Addendum)
   Subjective:    Patient ID: Anna Valentine, female    DOB: 03-06-1951, 71 y.o.   MRN: [id number]  HPI 71 year old Female with complex history including history of non-small cell lung cancer stage I in 2006 status post right upper lobe wedge resection, middle lobe wedge resection, left lower lobectomy and mediastinal lymph node dissection.  Pathology showed 1.2 cm poorly differentiated adenocarcinoma.  Has been followed at [facility] and also by [facility].  She has a history of breast cancer diagnosed in 2011 diagnosed as lobular carcinoma and underwent left lumpectomy.  Sentinel node biopsy showed 1 out of 4 lymph nodes positive for invasive lobular carcinoma.  She was clinically staged 2.  Tumor was ER positive PR positive HER2/neu negative and radiation therapy was completed in July 2011.  Also has history of Mycobacterium avium lung infection followed at Infectious Disease clinic.  She has a history of osteoporosis as well.  She is a widow and resides alone.  Recently on December 23, she was reaching for an item that was in the floor of her car on the right side and suffered excruciating pain in her right chest area.  She was concerned that she might have fractured a rib.  Symptoms have been rather severe.  She is not short of breath.  She has no hemoptysis.  No fever.  Just has apparent musculoskeletal pain.  No bruising.  She is also concerned about her sternal area as well.  Patient is seen today by [employer].  She is identified using 2 identifiers as Anna Valentine, a longstanding patient in this practice.  She is at her home and I am at my office.  She is agreeable to visit in this format today.  The weather is rainy and she really does not feel like driving.  She recently saw Dr. Megan Salon at Infectious Disease clinic regarding Mycobacterium avium lung infection and he felt her symptoms were stable.  I do not see a recent bone density study but  bone density study in 2013 indicated she had osteoporosis with T-score of -2.5.  She takes Femara per Dr. Lindi Adie.  Recently received Prolia injection December 19 per Dr. Kelton Pillar at [facility].    Review of Systems no fever, chills, hemoptysis, vomiting.     Objective:   Physical Exam  Patient is seen virtually.  She looks a bit pale and somewhat fatigued.  She has no respiratory distress at the present time.  No coughing.  She is able describe her pain in the right rib cage area in detail and give a clear concise history.      Assessment & Plan:  Musculoskeletal pain secondary to muscle strain right chest wall.  Rule out occult rib fracture with history of osteoporosis.  Patient is worried about this.  Osteoporosis treated with Prolia per Dr. Kelton Pillar  Plan: Right rib detail film and chest x-ray are negative today.  Called patient with results and she is relieved.  Will send in hydrocodone APAP 5/325 (number 15 tablets) to take 1 every 8 hours as needed for severe musculoskeletal pain.  May apply ice or heat to the right chest area.  Call if symptoms not improving within a few days or sooner if worse.  [place] PMP aware checked.

## 2022-07-06 NOTE — Patient Instructions (Addendum)
Rib detail film and CXR are WNL.  Results relayed to patient today by phone.  Send in to pharmacy prescription for Hydrocodone APAP 5/325 1 tablet by mouth with food every 8 hours as needed for severe musculoskeletal pain.  My feeling is that this is musculoskeletal pain.  She will call if not improving in a few days.  [place] PMP aware checked.

## 2022-07-06 NOTE — Telephone Encounter (Addendum)
Anna Valentine [phone]  Anna Valentine is wanting to come in and see you, she said she was reaching for something in the car on Saturday and has pulled something in her chest, it is little better, but she feels she needs to see you.

## 2022-07-06 NOTE — Telephone Encounter (Signed)
Called and schedule video visit at 11:00

## 2022-07-13 ENCOUNTER — Encounter: Payer: Self-pay | Admitting: Internal Medicine

## 2022-07-15 DIAGNOSIS — Z23 Encounter for immunization: Secondary | ICD-10-CM | POA: Diagnosis not present

## 2022-07-21 ENCOUNTER — Ambulatory Visit: Payer: Medicare Other | Admitting: Internal Medicine

## 2022-07-28 ENCOUNTER — Telehealth: Payer: Medicare Other | Admitting: Internal Medicine

## 2022-08-08 ENCOUNTER — Encounter: Payer: Self-pay | Admitting: Emergency Medicine

## 2022-08-08 NOTE — Patient Instructions (Signed)
It was a pleasure to see you today.  Be careful with public outings during cold and flu season.  Please wear a mask.  Continue current medications and continue follow-up with Cardiology, Oncology, and Endocrinology.  Return here in 1 year or as needed.

## 2022-08-12 ENCOUNTER — Telehealth: Payer: Self-pay | Admitting: Internal Medicine

## 2022-08-12 ENCOUNTER — Encounter: Payer: Self-pay | Admitting: Internal Medicine

## 2022-08-12 NOTE — Telephone Encounter (Signed)
Jaydn Fincher [phone]  [place] called to say for 3-4 weeks she has been having more shortness of breath than normal, dry cough, no fever, night sweats, she has been off the antibiotics since October. This has her feeling un easy, she did not know if she should call you or Dr Megan Salon. She would like to come in first of the week to talk with you about all of this to see what you think.

## 2022-08-12 NOTE — Telephone Encounter (Signed)
Called patient back and she is going to call Dr Hale Bogus office and get an appointment.

## 2022-08-16 ENCOUNTER — Encounter: Payer: Self-pay | Admitting: Internal Medicine

## 2022-08-16 ENCOUNTER — Other Ambulatory Visit: Payer: Self-pay

## 2022-08-16 ENCOUNTER — Ambulatory Visit
Admission: RE | Admit: 2022-08-16 | Discharge: 2022-08-16 | Disposition: A | Discharge status: Home / Self Care | Payer: Medicare Other | Source: Ambulatory Visit | Attending: Internal Medicine | Admitting: Internal Medicine

## 2022-08-16 ENCOUNTER — Ambulatory Visit (INDEPENDENT_AMBULATORY_CARE_PROVIDER_SITE_OTHER): Payer: Medicare Other | Admitting: Internal Medicine

## 2022-08-16 VITALS — BP 125/78 | HR 95 | Temp 97.4°F | Wt 110.0 lb

## 2022-08-16 DIAGNOSIS — A310 Pulmonary mycobacterial infection: Secondary | ICD-10-CM

## 2022-08-16 DIAGNOSIS — R0602 Shortness of breath: Secondary | ICD-10-CM | POA: Diagnosis not present

## 2022-08-16 NOTE — Assessment & Plan Note (Signed)
I am certainly concerned that her recent symptoms might be due to reactivation of Mycobacterium avium infection.  Repeat sputum AFB stain and culture and a two-view chest x-ray.  She is due to see Dr. Brigitte Pulse at [facility] on 08/29/2022.

## 2022-08-16 NOTE — Progress Notes (Signed)
Monitored SpO2 while patient ambulated around the clinic. O2 remained between 94-98% on room air.   Beryle Flock, RN

## 2022-08-16 NOTE — Progress Notes (Signed)
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    Priority: High   Hearing loss 11/04/2020    Priority: Medium    Intractable nausea and vomiting 02/20/2020    Priority: Medium    Weight loss, non-intentional 09/20/2019    Priority: Medium    Diarrhea 04/21/2022   Posterior vitreous detachment of both eyes 03/10/2022   Localized osteoporosis with current pathological fracture 08/25/2021   Skin rash 02/28/2020   Hyponatremia    Dehydration with hyponatremia 02/20/2020   Low back pain 02/20/2020   Chronic diastolic CHF (congestive heart failure) ([place]) 02/20/2020   Essential hypertension 52/77/8242   Acute metabolic encephalopathy 35/36/1443   Right epiretinal membrane 01/30/2020   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   Nuclear sclerotic cataract of right eye 01/30/2020   Nuclear sclerotic cataract of left eye 01/30/2020   Iron deficiency anemia 01/20/2020   Malignant neoplasm of overlapping sites of right breast in female, estrogen receptor positive ([place]) 09/20/2019   Postprocedural pneumothorax    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   Port-A-Cath in place 04/26/2018   Genetic testing 02/20/2018   Family history of breast cancer    Family history of lung cancer    Personal history of lung cancer    Chest pain 11/28/2017   Abnormal CT of the chest 11/30/2016   Malignant neoplasm of upper-inner quadrant of left breast in female, estrogen receptor positive ([place]) 06/16/2014   Osteopenia 09/13/2013   Postmenopausal atrophic vaginitis 09/13/2013   Vitamin D deficiency 09/13/2013   Malignant neoplasm of lower lobe of right lung ([place]) 07/24/2012   Anxiety 03/11/2011   History of neutropenia 03/11/2011   Family history of colon cancer 03/11/2011   Bronchiectasis without complication ([place]) 15/40/0867    Patient's Medications  New Prescriptions   No medications on file  Previous  Medications   ACYCLOVIR (ZOVIRAX) 400 MG TABLET    TAKE 1 TABLET BY MOUTH TWICE DAILY   ALBUTEROL (VENTOLIN HFA) 108 (90 BASE) MCG/ACT INHALER    INHALE 2 PUFFS INTO LUNGS EVERY 6 HOURS AS NEEDED FOR WHEEZING OR SHORTNESS OF BREATH.   CALCIUM CARBONATE-VIT D-MIN (CALCIUM 1200 PO)    Take by mouth daily.   CHOLECALCIFEROL (D3 2000 PO)    Take by mouth.   HYDROCODONE-ACETAMINOPHEN (NORCO) 5-325 MG TABLET    One tab by mouth every 8 hours as needed for severe musculoskeletal pain. TAKE WITH FOOD   LETROZOLE (FEMARA) 2.5 MG TABLET    Take 1 tablet (2.5 mg total) by mouth daily.   ROSUVASTATIN (CRESTOR) 10 MG TABLET    Take 1 tablet (10 mg total) by mouth daily.   VITAMIN C (ASCORBIC ACID) 500 MG TABLET    Take 500 mg by mouth daily.  Modified Medications   No medications on file  Discontinued Medications   No medications on file    Subjective: Anna Valentine is seen on a work in basis.  She has a remote history of lung cancer requiring right lower lobectomy and bronchiectasis. She was diagnosed with Mycobacterium avium pneumonia and started on azithromycin, ethambutol and rifampin in 2021.  She was intolerant of rifampin with severe GI distress and was briefly hospitalized.  She improved with stopping antibiotic therapy at that time.    She started on a salvage regimen of azithromycin, ethambutol and clofazimine in January 2022.  She had significant symptomatic improvement.  A CT scan this past  July showed stable abnormalities with no progression of her pulmonary findings.  However, she remained culture positive and started to have hearing loss bilaterally, presumed related to azithromycin.  She also started having worsening diarrhea.  I had her stop taking her antibiotics on 04/21/2022.  Her diarrhea resolved promptly.  She has not noted any change in her hearing. She uses hearing aids at baseline.  Over the past 3 weeks she has noted gradually worsening DOE. Over the last week she has developed a cough  that is mostly dry but occasionally productive. She has not had any documented fevers but has felt cold and is having mild night sweats. Her appetite is falling off.  Review of Systems: Review of Systems  Constitutional:  Positive for chills, diaphoresis, malaise/fatigue and weight loss. Negative for fever.  Respiratory:  Positive for cough, sputum production and shortness of breath. Negative for hemoptysis and wheezing.   Cardiovascular:  Positive for chest pain.  Musculoskeletal:  Positive for back pain.    Past Medical History:  Diagnosis Date   Anemia    hx of IDA   BRCA negative 2011   BRCA I/ II negative   Breast cancer ([place]) 08/2009   LEFT=stage 2, rx with lumpectomy and xrt   Breast cancer ([place]) 2019   RIGHT   Bronchiectasis ([place])    Chronic diastolic CHF (congestive heart failure) ([place]) 02/20/2020   Constipation    Essential hypertension 02/20/2020   Family history of adverse reaction to anesthesia    My Sister has nausea   Family history of breast cancer    Family history of colon cancer    Family history of lung cancer    GERD (gastroesophageal reflux disease)    not presently having symptoms   Hypertension    Iatrogenic pneumothorax 02/06/2019   Lung cancer ([place]) 06/06/05   stage 1 poorly differentiated adenocarcinoma, s/p right lower lobectomy.   Mycobacterium avium complex ([place])    dx 2021   Osteopenia 09/13/2013   Osteoporosis    Personal history of lung cancer    Pneumonia    2007ish, walking pnemonia - 2009 ish   STD (sexually transmitted disease)    HSV   Vitamin D deficiency 09/13/2013    Social History   Tobacco Use   Smoking status: Former    Packs/day: 2.00    Years: 18.00    Total pack years: 36.00    Types: Cigarettes    Quit date: 07/12/1987    Years since quitting: 35.1   Smokeless tobacco: Never  Vaping Use   Vaping Use: Never used  Substance Use Topics   Alcohol use: Yes    Comment: occasionally   Drug use: No    Family History   Problem Relation Age of Onset   Allergies Mother    Asthma Mother    Lung cancer Mother    Breast cancer Mother 63       recurrence age 43   Colon cancer Father 56   Colon polyps Father 85   Prostate cancer Brother 55   Breast cancer Sister 70       Recurrence age 15 BRCA negative   Colon polyps Sister    Leukemia Sister    Breast cancer Sister 4   Prostate cancer Brother 37   Breast cancer Maternal Grandmother 83   Colon cancer Maternal Aunt    Leukemia Maternal Grandfather    Lung cancer Maternal Aunt    Breast cancer Cousin    Esophageal  cancer Neg Hx    Rectal cancer Neg Hx    Stomach cancer Neg Hx     Allergies  Allergen Reactions   Rifampin Nausea And Vomiting   Taxol [Paclitaxel] Anaphylaxis   Paclitaxel (Protein-Bound) Hives   Tape Itching   Abraxane [Paclitaxel Protein-Bound Part] Rash   Clarithromycin Rash    Objective: Vitals:   08/16/22 1434  BP: 125/78  Pulse: 95  Temp: (!) 97.4 F (36.3 C)  TempSrc: Oral  SpO2: 98%  Weight: 110 lb (49.9 kg)   Body mass index is 17.75 kg/m.  Physical Exam Constitutional:      Comments: She is calm and pleasant as usual.  Cardiovascular:     Rate and Rhythm: Normal rate and regular rhythm.     Heart sounds: No murmur heard. Pulmonary:     Effort: Pulmonary effort is normal.     Breath sounds: No wheezing, rhonchi or rales.     Comments: Her O2 saturation was 94-98% while walking her in clinic. Psychiatric:        Mood and Affect: Mood normal.         Problem List Items Addressed This Visit       High   Mycobacterium avium infection ([place]) - Primary    I am certainly concerned that her recent symptoms might be due to reactivation of Mycobacterium avium infection.  Repeat sputum AFB stain and culture and a two-view chest x-ray.  She is due to see Dr. Brigitte Pulse at [facility] on 08/29/2022.      Relevant Orders   MYCOBACTERIA, CULTURE, WITH FLUOROCHROME SMEAR   DG Chest 2 View      Michel Bickers, MD [facility] [place] [facility] [phone] pager   [phone] cell 08/16/2022, 3:08 PM

## 2022-08-16 NOTE — Addendum Note (Signed)
Addended by: Caffie Pinto on: 08/16/2022 03:45 PM   Modules accepted: Orders

## 2022-08-18 ENCOUNTER — Telehealth: Payer: Self-pay

## 2022-08-18 ENCOUNTER — Encounter: Payer: Self-pay | Admitting: Internal Medicine

## 2022-08-18 NOTE — Telephone Encounter (Signed)
Per Dr. Megan Salon no new changes or worsening pneumonia on chest xray. Patient is aware and had no further questions.

## 2022-08-22 ENCOUNTER — Other Ambulatory Visit (HOSPITAL_COMMUNITY): Payer: Self-pay | Admitting: *Deleted

## 2022-08-22 MED ORDER — ROSUVASTATIN CALCIUM 10 MG PO TABS: 10.0000 mg | ORAL_TABLET | Freq: Every day | ORAL | 11 refills | Status: DC

## 2022-08-24 ENCOUNTER — Encounter: Payer: Self-pay | Admitting: Internal Medicine

## 2022-08-24 NOTE — Telephone Encounter (Signed)
Have been dealing with a temp since last Friday. Started at 100.5, been all over the place since then from normal to 99.5. 100.8 right now.   I received this message from [place] today.  She has been feeling steadily worse over the past few weeks and is now having low-grade fevers along with her night sweats, worsening dry cough and worsening dyspnea on exertion.  Her chest x-ray last week did not show any new, acute changes in her oxygen saturation walking in the clinic was 94-98% on room air.  She has been taking Advil intermittently recently for her fevers.  I suggested that she take acetaminophen 3 times daily to try to blunt her fevers.  She is scheduled to see Dr. Brigitte Pulse at [facility] next Monday, 08/29/2022.  She follows up with me shortly after that visit.

## 2022-08-29 DIAGNOSIS — A310 Pulmonary mycobacterial infection: Secondary | ICD-10-CM | POA: Diagnosis not present

## 2022-08-29 DIAGNOSIS — J479 Bronchiectasis, uncomplicated: Secondary | ICD-10-CM | POA: Diagnosis not present

## 2022-08-29 DIAGNOSIS — Z1331 Encounter for screening for depression: Secondary | ICD-10-CM | POA: Diagnosis not present

## 2022-08-30 ENCOUNTER — Encounter: Payer: Self-pay | Admitting: Internal Medicine

## 2022-08-31 ENCOUNTER — Encounter: Payer: Self-pay | Admitting: Internal Medicine

## 2022-08-31 ENCOUNTER — Ambulatory Visit (INDEPENDENT_AMBULATORY_CARE_PROVIDER_SITE_OTHER): Payer: Medicare Other | Admitting: Internal Medicine

## 2022-08-31 ENCOUNTER — Other Ambulatory Visit: Payer: Self-pay

## 2022-08-31 ENCOUNTER — Telehealth: Payer: Self-pay

## 2022-08-31 DIAGNOSIS — A310 Pulmonary mycobacterial infection: Secondary | ICD-10-CM

## 2022-08-31 DIAGNOSIS — D2272 Melanocytic nevi of left lower limb, including hip: Secondary | ICD-10-CM | POA: Diagnosis not present

## 2022-08-31 DIAGNOSIS — L814 Other melanin hyperpigmentation: Secondary | ICD-10-CM | POA: Diagnosis not present

## 2022-08-31 DIAGNOSIS — D225 Melanocytic nevi of trunk: Secondary | ICD-10-CM | POA: Diagnosis not present

## 2022-08-31 DIAGNOSIS — D2271 Melanocytic nevi of right lower limb, including hip: Secondary | ICD-10-CM | POA: Diagnosis not present

## 2022-08-31 DIAGNOSIS — L720 Epidermal cyst: Secondary | ICD-10-CM | POA: Diagnosis not present

## 2022-08-31 DIAGNOSIS — L820 Inflamed seborrheic keratosis: Secondary | ICD-10-CM | POA: Diagnosis not present

## 2022-08-31 DIAGNOSIS — L308 Other specified dermatitis: Secondary | ICD-10-CM | POA: Diagnosis not present

## 2022-08-31 DIAGNOSIS — D485 Neoplasm of uncertain behavior of skin: Secondary | ICD-10-CM | POA: Diagnosis not present

## 2022-08-31 DIAGNOSIS — D0439 Carcinoma in situ of skin of other parts of face: Secondary | ICD-10-CM | POA: Diagnosis not present

## 2022-08-31 DIAGNOSIS — L821 Other seborrheic keratosis: Secondary | ICD-10-CM | POA: Diagnosis not present

## 2022-08-31 MED ORDER — ETHAMBUTOL HCL 400 MG PO TABS: 800.0000 mg | ORAL_TABLET | Freq: Every day | ORAL | 11 refills | Status: DC

## 2022-08-31 MED ORDER — AZITHROMYCIN 250 MG PO TABS: 250.0000 mg | ORAL_TABLET | Freq: Every day | ORAL | 11 refills | Status: DC

## 2022-08-31 MED ORDER — PROMETHAZINE HCL 12.5 MG PO TABS: 12.5000 mg | ORAL_TABLET | Freq: Four times a day (QID) | ORAL | 11 refills | Status: DC | PRN

## 2022-08-31 NOTE — Telephone Encounter (Signed)
Per MD faxed office note to Duayne Cal, hearing specialist. Confirmation received that fax went through. Leatrice Jewels, RMA

## 2022-08-31 NOTE — Progress Notes (Signed)
Virtual Visit via Video Note  I connected with Anna Valentine on 08/31/22 at 11:00 AM EST by a video enabled telemedicine application and verified that I am speaking with the correct person using two identifiers.  Location: Patient: Home Provider: [facility]   I discussed the limitations of evaluation and management by telemedicine and the availability of in person appointments. The patient expressed understanding and agreed to proceed.  History of Present Illness: I called and spoke with Anna Valentine today.  She met with Dr. Brigitte Pulse at [facility] yesterday.  He recommended that she restart azithromycin at a lower dose of 250 mg daily and to also restart ethambutol for her chronic, smoldering Mycobacterium avium pneumonia that has recently flared up again.  She took her first doses yesterday.  He recommended holding off on restarting clofazimine for now since it did not help her because culture negative previously.  He did say that if she has a suboptimal response to azithromycin and ethambutol to consider adding aerosolized amikacin.  She has been bothered by increased her dry cough, shortness of breath and night sweats for the past month.   Observations/Objective:   Assessment and Plan: She is in agreement with the plan to restart lower dose of azithromycin along with ethambutol.  She has a nebulizer at home that Dr. Lamonte Sakai, her pulmonologist, prescribed but she has not been using aerosolized saline.  I suggested that she talk with him about starting it when she sees him in several days.  She is also having some problems with chronic nausea so I will prescribe Phenergan which has helped her in the past.  Follow Up Instructions: Continue lower dose azithromycin along with ethambutol Add aerosolized saline She will need very close follow-up with her audiologist, Stark Klein at [facility] Follow-up here in 4 weeks    I discussed the assessment and treatment plan with  the patient. The patient was provided an opportunity to ask questions and all were answered. The patient agreed with the plan and demonstrated an understanding of the instructions.   The patient was advised to call back or seek an in-person evaluation if the symptoms worsen or if the condition fails to improve as anticipated.  I provided 18 minutes of non-face-to-face time during this encounter.   Michel Bickers, MD

## 2022-09-02 ENCOUNTER — Ambulatory Visit (INDEPENDENT_AMBULATORY_CARE_PROVIDER_SITE_OTHER): Payer: Medicare Other | Admitting: Emergency Medicine

## 2022-09-02 ENCOUNTER — Encounter: Payer: Self-pay | Admitting: Emergency Medicine

## 2022-09-02 VITALS — BP 116/72 | HR 96 | Temp 97.7°F | Ht 66.0 in | Wt 111.6 lb

## 2022-09-02 DIAGNOSIS — R053 Chronic cough: Secondary | ICD-10-CM

## 2022-09-02 DIAGNOSIS — A310 Pulmonary mycobacterial infection: Secondary | ICD-10-CM | POA: Diagnosis not present

## 2022-09-02 NOTE — Assessment & Plan Note (Signed)
Evidence for clinical recurrence/progression.  She has been off 3 drug therapy since October.  She had improved significantly and was having side effects from the medications.  Given her constitutional symptoms, increased cough she is now back on lower dose azithromycin and ethambutol.  We will plan to perform a repeat CT scan of the chest now to determine degree of bronchiectasis, parenchymal disease as she begins this medical regimen.  We discussed bronchodilator therapy and possibly starting nebulized saline at some point in the future if she has difficulty with secretion clearance  We will plan to repeat your CT scan of the chest now to compare with priors. Agree with azithromycin and ethambutol as managed by Dr. Megan Salon and Dr. Lorenda Cahill Use your albuterol as needed to help with mucus clearance.  You probably need to use it at least once every day. We could consider adding nebulized saline if you need it to help with mucus clearance Follow-up with Dr. Lamonte Sakai next available after your CT so we can review the results together.

## 2022-09-02 NOTE — Assessment & Plan Note (Signed)
Multifactorial cough.  Suspect that she is having at least some upper airway irritation, hoarseness and change in voice due to the cough brought on by her [facility].  Consider also component of reflux.  Will do a trial of taking Pepcid every day for 2 weeks to see if her upper airway irritation improves.

## 2022-09-02 NOTE — Patient Instructions (Signed)
We will plan to repeat your CT scan of the chest now to compare with priors. Agree with azithromycin and ethambutol as managed by Dr. Megan Salon and Dr. Lorenda Cahill Use your albuterol as needed to help with mucus clearance.  You probably need to use it at least once every day. We could consider adding nebulized saline if you need it to help with mucus clearance Consider taking your Pepcid once daily every day to see if this suppresses reflux, helps with upper airway irritation and coughing Follow-up with Dr. Lamonte Sakai next available after your CT so we can review the results together.

## 2022-09-02 NOTE — Progress Notes (Signed)
ct 

## 2022-09-02 NOTE — Progress Notes (Signed)
Subjective:    Patient ID: Anna Valentine, female    DOB: 12-21-1950, 72 y.o.   MRN: [id number]  HPI  ROV 02/08/22 --72 year old woman with a history of right lower lobe non-small cell lung cancer with right lower lobe lobectomy.  She has significant parenchymal disease on the right due to superimposed mycobacterial infection.  Also with a history of breast cancer.  We discovered clot in her right lower lobe pulmonary artery stump on imaging and treat her with anticoagulation for 3 months.  She has been followed by ID, has remained on azithromycin, clofazamine and ethambutol.  Her most recent CT scan of the chest was done 01/14/2022 as below.  She has baseline exertional SOB but is not limited. Her cough is quiet right now. Her energy and nausea are both much improved.   CT chest 01/14/2022 reviewed by me showed overall stable bronchiectatic change and right-sided parenchymal distortion, some associated mucous plugging especially in the lingula, unchanged.   ROV 09/02/2022 --follow-up visit 72 year old woman with a history of breast cancer, right lower lobe non-small cell lung cancer and associated lobectomy, significant parenchymal distortion and interstitial disease due to Mycobacterium avium infection.  Finally she also had thrombus in her right lower lobe pulmonary artery stump and completed 3 months of anticoagulation. She was able to come off abx in October 2023, but slowly began to evolve symptoms - sweats, malaise, fever, cough w green mucous. She saw Dr Lorenda Cahill at [facility] with [place] as well.  She just restarted azithromycin (low-dose) and ethambutol, follows with audiology closely. She is using albuterol about once a day. She questions whether she needs to use nebulized saline.     Review of Systems  Constitutional:  Positive for activity change and fatigue. Negative for fever and unexpected weight change.  HENT: Negative.  Negative for congestion, dental problem, ear pain, nosebleeds, postnasal  drip, rhinorrhea, sinus pressure, sneezing, sore throat and trouble swallowing.   Eyes: Negative.  Negative for redness and itching.  Respiratory: Negative.  Negative for cough, chest tightness, shortness of breath and wheezing.   Cardiovascular: Negative.  Negative for palpitations and leg swelling.  Gastrointestinal: Negative.  Negative for nausea and vomiting.  Genitourinary: Negative.  Negative for dysuria.  Musculoskeletal:  Positive for back pain. Negative for joint swelling.  Skin: Negative.  Negative for rash.  Neurological: Negative.  Negative for headaches.  Hematological: Negative.  Does not bruise/bleed easily.  Psychiatric/Behavioral: Negative.  Negative for dysphoric mood. The patient is not nervous/anxious.         Objective:   Physical Exam Vitals:   09/02/22 1136  BP: 116/72  Pulse: 96  Temp: 97.7 F (36.5 C)  TempSrc: Oral  SpO2: 98%  Weight: 111 lb 9.6 oz (50.6 kg)  Height: '5\' 6"'$  (1.676 m)   Gen: stronger, thin woman, well-nourished, in no distress  ENT: No lesions,  mouth clear,  oropharynx clear, no postnasal drip  Neck: No JVD, no stridor  Lungs: No use of accessory muscles, coarse R lung with some inspiratory and expiratory rhonchi. L is clear  Cardiovascular: RRR, heart sounds normal, no murmur or gallops, no peripheral edema  Musculoskeletal: No deformities, no cyanosis or clubbing  Neuro: alert, non focal  Skin: Warm, no lesions or rash     Assessment & Plan:  Mycobacterium avium infection ([place]) Evidence for clinical recurrence/progression.  She has been off 3 drug therapy since October.  She had improved significantly and was having side effects from the medications.  Given her constitutional symptoms, increased cough she is now back on lower dose azithromycin and ethambutol.  We will plan to perform a repeat CT scan of the chest now to determine degree of bronchiectasis, parenchymal disease as she begins this medical regimen.  We discussed  bronchodilator therapy and possibly starting nebulized saline at some point in the future if she has difficulty with secretion clearance  We will plan to repeat your CT scan of the chest now to compare with priors. Agree with azithromycin and ethambutol as managed by Dr. Megan Salon and Dr. Lorenda Cahill Use your albuterol as needed to help with mucus clearance.  You probably need to use it at least once every day. We could consider adding nebulized saline if you need it to help with mucus clearance Follow-up with Dr. Lamonte Sakai next available after your CT so we can review the results together.  Cough Multifactorial cough.  Suspect that she is having at least some upper airway irritation, hoarseness and change in voice due to the cough brought on by her [facility].  Consider also component of reflux.  Will do a trial of taking Pepcid every day for 2 weeks to see if her upper airway irritation improves.   Baltazar Apo, MD, PhD 09/02/2022, 1:08 PM [facility] [phone] or if no answer [phone]

## 2022-09-30 ENCOUNTER — Ambulatory Visit
Admission: RE | Admit: 2022-09-30 | Discharge: 2022-09-30 | Disposition: A | Discharge status: Home / Self Care | Payer: Medicare Other | Source: Ambulatory Visit | Attending: Emergency Medicine | Admitting: Emergency Medicine

## 2022-09-30 DIAGNOSIS — J479 Bronchiectasis, uncomplicated: Secondary | ICD-10-CM | POA: Diagnosis not present

## 2022-09-30 DIAGNOSIS — R0602 Shortness of breath: Secondary | ICD-10-CM | POA: Diagnosis not present

## 2022-09-30 DIAGNOSIS — I700 Atherosclerosis of aorta: Secondary | ICD-10-CM | POA: Diagnosis not present

## 2022-09-30 DIAGNOSIS — A310 Pulmonary mycobacterial infection: Secondary | ICD-10-CM

## 2022-10-04 ENCOUNTER — Encounter: Payer: Self-pay | Admitting: Emergency Medicine

## 2022-10-04 NOTE — Telephone Encounter (Signed)
Called pt and scheduled her for and OV w/ Dr.Byrum on 4/3 @ 1pm

## 2022-10-05 ENCOUNTER — Encounter: Payer: Self-pay | Admitting: Internal Medicine

## 2022-10-05 ENCOUNTER — Other Ambulatory Visit: Payer: Self-pay

## 2022-10-05 ENCOUNTER — Other Ambulatory Visit (HOSPITAL_COMMUNITY): Payer: Self-pay

## 2022-10-05 ENCOUNTER — Ambulatory Visit (INDEPENDENT_AMBULATORY_CARE_PROVIDER_SITE_OTHER): Payer: Medicare Other | Admitting: Internal Medicine

## 2022-10-05 ENCOUNTER — Encounter: Payer: Self-pay | Admitting: Oncology

## 2022-10-05 VITALS — BP 134/88 | HR 111 | Temp 97.9°F | Ht 66.0 in | Wt 107.0 lb

## 2022-10-05 DIAGNOSIS — A310 Pulmonary mycobacterial infection: Secondary | ICD-10-CM

## 2022-10-05 MED ORDER — CLOFAZIMINE POWD: 100.0000 mg | Freq: Every day | Status: DC

## 2022-10-05 NOTE — Progress Notes (Addendum)
[facility] for Infectious Disease  Patient Active Problem List   Diagnosis Date Noted   Mycobacterium avium infection ([place]) 02/11/2020    Priority: High   Hearing loss 11/04/2020    Priority: Medium    Intractable nausea and vomiting 02/20/2020    Priority: Medium    Weight loss, non-intentional 09/20/2019    Priority: Medium    Diarrhea 04/21/2022   Posterior vitreous detachment of both eyes 03/10/2022   Localized osteoporosis with current pathological fracture 08/25/2021   Skin rash 02/28/2020   Hyponatremia    Dehydration with hyponatremia 02/20/2020   Low back pain 02/20/2020   Chronic diastolic CHF (congestive heart failure) ([place]) 02/20/2020   Essential hypertension 0000000   Acute metabolic encephalopathy 0000000   Right epiretinal membrane 01/30/2020   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   Nuclear sclerotic cataract of right eye 01/30/2020   Nuclear sclerotic cataract of left eye 01/30/2020   Iron deficiency anemia 01/20/2020   Malignant neoplasm of overlapping sites of right breast in female, estrogen receptor positive ([place]) 09/20/2019   Postprocedural pneumothorax    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   Port-A-Cath in place 04/26/2018   Genetic testing 02/20/2018   Family history of breast cancer    Family history of lung cancer    Personal history of lung cancer    Chest pain 11/28/2017   Abnormal CT of the chest 11/30/2016   Malignant neoplasm of upper-inner quadrant of left breast in female, estrogen receptor positive ([place]) 06/16/2014   Osteopenia 09/13/2013   Postmenopausal atrophic vaginitis 09/13/2013   Vitamin D deficiency 09/13/2013   Malignant neoplasm of lower lobe of right lung ([place]) 07/24/2012   Anxiety 03/11/2011   History of neutropenia 03/11/2011   Family history of colon cancer 03/11/2011   Cough 11/15/2010   Bronchiectasis without complication ([place]) [id number]    Patient's Medications  New Prescriptions   CLOFAZIMINE POWD     Take 100 mg by mouth daily.  Previous Medications   ACYCLOVIR (ZOVIRAX) 400 MG TABLET    TAKE 1 TABLET BY MOUTH TWICE DAILY   ALBUTEROL (VENTOLIN HFA) 108 (90 BASE) MCG/ACT INHALER    INHALE 2 PUFFS INTO LUNGS EVERY 6 HOURS AS NEEDED FOR WHEEZING OR SHORTNESS OF BREATH.   AZITHROMYCIN (ZITHROMAX) 250 MG TABLET    Take 1 tablet (250 mg total) by mouth daily.   CALCIUM CARBONATE-VIT D-MIN (CALCIUM 1200 PO)    Take by mouth daily.   CHOLECALCIFEROL (D3 2000 PO)    Take by mouth.   ETHAMBUTOL (MYAMBUTOL) 400 MG TABLET    Take 2 tablets (800 mg total) by mouth daily. 800 mg   HYDROCODONE-ACETAMINOPHEN (NORCO) 5-325 MG TABLET    One tab by mouth every 8 hours as needed for severe musculoskeletal pain. TAKE WITH FOOD   LETROZOLE (FEMARA) 2.5 MG TABLET    Take 1 tablet (2.5 mg total) by mouth daily.   PROMETHAZINE (PHENERGAN) 12.5 MG TABLET    Take 1 tablet (12.5 mg total) by mouth every 6 (six) hours as needed for nausea or vomiting.   ROSUVASTATIN (CRESTOR) 10 MG TABLET    Take 1 tablet (10 mg total) by mouth daily.   VITAMIN C (ASCORBIC ACID) 500 MG TABLET    Take 500 mg by mouth daily.  Modified Medications   No medications on file  Discontinued Medications   No medications on file    Subjective: Anna Valentine is in for her routine follow-up visit.  She has struggled with  chronic, smoldering Mycobacterium avium pneumonia for several years. She has a remote history of lung cancer requiring right lower lobectomy and bronchiectasis. She was diagnosed with Mycobacterium avium pneumonia and started on azithromycin, ethambutol and rifampin in 2021.  She was intolerant of rifampin with severe GI distress and was briefly hospitalized.  She improved with stopping antibiotic therapy at that time.     She started on a salvage regimen of azithromycin, ethambutol and clofazimine in January 2022.  She had significant symptomatic improvement.  A CT scan this past July showed stable abnormalities with no progression of  her pulmonary findings.  However, she remained culture positive and started to have hearing loss bilaterally, presumed related to azithromycin.  She also started having worsening diarrhea.  I had her stop taking her antibiotics on 04/21/2022.  Her diarrhea resolved promptly.     In January she noted gradually worsening DOE, then developed a cough that was mostly dry but occasionally productive. She did not have any documented fevers but felt cold and was having mild night sweats. Her appetite worsened and she started to lose weight gradually.   She met with Dr. Brigitte Pulse at [facility] on 08/30/2022.  He recommended that she restart azithromycin at a lower dose of 250 mg daily and to also restart ethambutol for her chronic, smoldering Mycobacterium avium pneumonia that has recently flared up again.   He recommended holding off on restarting clofazimine for now since it did not help her become culture negative previously.  He did say that if she has a suboptimal response to azithromycin and ethambutol to consider adding aerosolized amikacin.    She feels like she is tolerating her antibiotics well.  She is still bothered by chronic nausea but does not feel that it is any worse since being back on antibiotics.  She has been taking Phenergan more frequently during the day.  She is feeling much more fatigued over the past month.  However she has noted improvement in her cough.  She is coughing less and bringing up less sputum.  Her sputum is very thin but green.  She has not had any hemoptysis.  She has not noted any improvement in her shortness of breath.  Her night sweats are much less frequent and less severe.  She saw her audiologist shortly after starting antibiotics and he noted there was actually some improvement in her hearing loss.  She has started to feel like sounds are little more muffled now since restarting azithromycin.  A repeat chest CT scan on 09/30/2022  revealed:  IMPRESSION: 1. New consolidation and possible associated necrosis in the right lower lobe, indicative of pneumonia. 2. New peribronchovascular nodularity and ground-glass in the left lower lobe, likely due to endobronchial spread of infection. 3. Extensive cylindrical bronchiectasis bilaterally. 4. Aortic atherosclerosis (ICD10-I70.0). Coronary artery calcification.   Review of Systems: Review of Systems  Constitutional:  Positive for diaphoresis, malaise/fatigue and weight loss. Negative for chills and fever.  HENT:  Positive for hearing loss.        Recently she has had some tenderness of her lower gums anteriorly.  She also has some intermittent discomfort in her left nares.  Respiratory:  Positive for cough, sputum production and shortness of breath. Negative for hemoptysis.   Cardiovascular:  Negative for chest pain.  Gastrointestinal:  Positive for nausea. Negative for abdominal pain, diarrhea and vomiting.  Musculoskeletal:  Positive for back pain.    Past Medical History:  Diagnosis Date  Anemia    hx of IDA   BRCA negative 2011   BRCA I/ II negative   Breast cancer ([place]) 08/2009   LEFT=stage 2, rx with lumpectomy and xrt   Breast cancer ([place]) 2019   RIGHT   Bronchiectasis ([place])    Chronic diastolic CHF (congestive heart failure) ([place]) 02/20/2020   Constipation    Essential hypertension 02/20/2020   Family history of adverse reaction to anesthesia    My Sister has nausea   Family history of breast cancer    Family history of colon cancer    Family history of lung cancer    GERD (gastroesophageal reflux disease)    not presently having symptoms   Hypertension    Iatrogenic pneumothorax 02/06/2019   Lung cancer ([place]) 06/06/05   stage 1 poorly differentiated adenocarcinoma, s/p right lower lobectomy.   Mycobacterium avium complex ([place])    dx 2021   Osteopenia 09/13/2013   Osteoporosis    Personal history of lung cancer    Pneumonia    2007ish, walking  pnemonia - 2009 ish   STD (sexually transmitted disease)    HSV   Vitamin D deficiency 09/13/2013    Social History   Tobacco Use   Smoking status: Former    Packs/day: 2.00    Years: 18.00    Additional pack years: 0.00    Total pack years: 36.00    Types: Cigarettes    Quit date: 07/12/1987    Years since quitting: 35.2   Smokeless tobacco: Never  Vaping Use   Vaping Use: Never used  Substance Use Topics   Alcohol use: Yes    Comment: occasionally   Drug use: No    Family History  Problem Relation Age of Onset   Allergies Mother    Asthma Mother    Lung cancer Mother    Breast cancer Mother 52       recurrence age 31   Colon cancer Father 59   Colon polyps Father 26   Prostate cancer Brother 74   Breast cancer Sister 62       Recurrence age 49 BRCA negative   Colon polyps Sister    Leukemia Sister    Breast cancer Sister 59   Prostate cancer Brother 3   Breast cancer Maternal Grandmother 65   Colon cancer Maternal Aunt    Leukemia Maternal Grandfather    Lung cancer Maternal Aunt    Breast cancer Cousin    Esophageal cancer Neg Hx    Rectal cancer Neg Hx    Stomach cancer Neg Hx     Allergies  Allergen Reactions   Rifampin Nausea And Vomiting   Taxol [Paclitaxel] Anaphylaxis   Paclitaxel (Protein-Bound) Hives   Tape Itching   Abraxane [Paclitaxel Protein-Bound Part] Rash   Clarithromycin Rash    Objective: Vitals:   10/05/22 1113  BP: 134/88  Pulse: (!) 111  Temp: 97.9 F (36.6 C)  TempSrc: Temporal  SpO2: 97%  Weight: 107 lb (48.5 kg)  Height: 5\' 6"  (1.676 m)   Body mass index is 17.27 kg/m.  Physical Exam Constitutional:      Comments: Her weight is down about 5 pounds recently.  She is in good spirits.  Cardiovascular:     Rate and Rhythm: Normal rate and regular rhythm.     Heart sounds: No murmur heard. Pulmonary:     Effort: Pulmonary effort is normal.     Breath sounds: Rales present. No wheezing or rhonchi.  Comments:  Crackles are noted in her right lung base posteriorly. Psychiatric:        Mood and Affect: Mood normal.        Problem List Items Addressed This Visit       High   Mycobacterium avium infection ([place]) - Primary    She is tolerating her azithromycin and ethambutol fairly well and says that she is not concerned about potentially more hearing loss related to being back on azithromycin.  I suggested that she follow-up with her audiologist within the next 2 months.  She says that she also wants to restart clofazimine to see if it makes any difference in her symptoms.  She does feel like she is improving since restarting antibiotics but is frustrated by how slow the improvement has been.  I suspect that some of her fatigue is related to taking Phenergan more frequently during the daytime.  I suggested that she try ondansetron during the day and Phenergan before bedtime.  I suspect that the worsening seen on a recent CT scan started while she was off of antibiotics for several months.  I will have her collect sputum samples for routine culture and repeat AFB stain and culture.  She will follow-up in 2 weeks.      Relevant Medications   Clofazimine POWD   Other Relevant Orders   CBC   Comprehensive metabolic panel   MYCOBACTERIA, CULTURE, WITH FLUOROCHROME SMEAR   Respiratory or Resp and Sputum Culture     Michel Bickers, MD [facility] for Infect[facility] [employer] [facility] [phone] pager   [phone] cell 10/05/2022, 12:09 PM

## 2022-10-05 NOTE — Assessment & Plan Note (Signed)
She is tolerating her azithromycin and ethambutol fairly well and says that she is not concerned about potentially more hearing loss related to being back on azithromycin.  I suggested that she follow-up with her audiologist within the next 2 months.  She says that she also wants to restart clofazimine to see if it makes any difference in her symptoms.  She does feel like she is improving since restarting antibiotics but is frustrated by how slow the improvement has been.  I suspect that some of her fatigue is related to taking Phenergan more frequently during the daytime.  I suggested that she try ondansetron during the day and Phenergan before bedtime.  I suspect that the worsening seen on a recent CT scan started while she was off of antibiotics for several months.  I will have her collect sputum samples for routine culture and repeat AFB stain and culture.  She will follow-up in 2 weeks.

## 2022-10-06 LAB — COMPREHENSIVE METABOLIC PANEL
AG Ratio: 1.2 (calc) (ref 1.0–2.5)
ALT: 37 U/L — ABNORMAL HIGH (ref 6–29)
AST: 42 U/L — ABNORMAL HIGH (ref 10–35)
Albumin: 4 g/dL (ref 3.6–5.1)
Alkaline phosphatase (APISO): 82 U/L (ref 37–153)
BUN: 13 mg/dL (ref 7–25)
CO2: 25 mmol/L (ref 20–32)
Calcium: 9.6 mg/dL (ref 8.6–10.4)
Chloride: 100 mmol/L (ref 98–110)
Creat: 0.74 mg/dL (ref 0.60–1.00)
Globulin: 3.3 g/dL (calc) (ref 1.9–3.7)
Glucose, Bld: 85 mg/dL (ref 65–99)
Potassium: 4.1 mmol/L (ref 3.5–5.3)
Sodium: 138 mmol/L (ref 135–146)
Total Bilirubin: 0.3 mg/dL (ref 0.2–1.2)
Total Protein: 7.3 g/dL (ref 6.1–8.1)

## 2022-10-06 LAB — CBC
HCT: 35.4 % (ref 35.0–45.0)
Hemoglobin: 11.6 g/dL — ABNORMAL LOW (ref 11.7–15.5)
MCH: 28.9 pg (ref 27.0–33.0)
MCHC: 32.8 g/dL (ref 32.0–36.0)
MCV: 88.1 fL (ref 80.0–100.0)
MPV: 10.1 fL (ref 7.5–12.5)
Platelets: 392 10*3/uL (ref 140–400)
RBC: 4.02 10*6/uL (ref 3.80–5.10)
RDW: 13.4 % (ref 11.0–15.0)
WBC: 11.9 10*3/uL — ABNORMAL HIGH (ref 3.8–10.8)

## 2022-10-12 ENCOUNTER — Ambulatory Visit (INDEPENDENT_AMBULATORY_CARE_PROVIDER_SITE_OTHER): Payer: Medicare Other | Admitting: Emergency Medicine

## 2022-10-12 ENCOUNTER — Encounter: Payer: Self-pay | Admitting: Emergency Medicine

## 2022-10-12 VITALS — BP 120/68 | HR 102 | Temp 97.9°F | Ht 66.0 in | Wt 104.2 lb

## 2022-10-12 DIAGNOSIS — A310 Pulmonary mycobacterial infection: Secondary | ICD-10-CM

## 2022-10-12 NOTE — Progress Notes (Signed)
Subjective:    Patient ID: Anna Valentine, female    DOB: 14-Mar-1951, 72 y.o.   MRN: [id number]  HPI  ROV 09/02/2022 --follow-up visit 72 year old woman with a history of breast cancer, right lower lobe non-small cell lung cancer and associated lobectomy, significant parenchymal distortion and interstitial disease due to Mycobacterium avium infection.  Finally she also had thrombus in her right lower lobe pulmonary artery stump and completed 3 months of anticoagulation. She was able to come off abx in October 2023, but slowly began to evolve symptoms - sweats, malaise, fever, cough w green mucous. She saw Dr Lorenda Cahill at [facility] with [facility] as well.  She just restarted azithromycin (low-dose) and ethambutol, follows with audiology closely. She is using albuterol about once a day. She questions whether she needs to use nebulized saline.   ROV 10/12/22 --Anna Valentine is 72 with history of breast cancer, right lower lobe non-small cell lung cancer with right lower lobe lobectomy.  She has subsequent significant parenchymal distortion, interstitial disease and bronchiectasis due to Mycobacterium avium.  Of note she also had thrombus in her right lower lobe pulmonary artery stump and did 3 months of anticoagulation. She came off her azithromycin, ethambutol in October 2023 but began to develop constitutional symptoms and increased cough.  The azithromycin and ethambutol were subsequently restarted.  She has had some chronic nausea associated with this.  Cough has improved some.  No hemoptysis.  Night sweats are better as well.  Some concern that she may be experiencing some hearing side effects, following with audiology.  She follows with Dr. Megan Salon and they are considering restarting clofazimine.  We repeated her CT chest 09/30/2022 as below. She still has significant exertional SOB with any activity. OK when she is sitting. The dyspnea has not improved like the other sx have.   CT scan of the chest 09/30/2022 reviewed  by me, shows her known right basilar bronchiectasis and distortion with some compensatory hypertrophy of the left lung, new parabronchial vascular nodularity on the left consistent with inflammation or infection, new consolidation possible associated close the right lower lobe.   Review of Systems  Constitutional:  Positive for activity change and fatigue. Negative for fever and unexpected weight change.  HENT: Negative.  Negative for congestion, dental problem, ear pain, nosebleeds, postnasal drip, rhinorrhea, sinus pressure, sneezing, sore throat and trouble swallowing.   Eyes: Negative.  Negative for redness and itching.  Respiratory: Negative.  Negative for cough, chest tightness, shortness of breath and wheezing.   Cardiovascular: Negative.  Negative for palpitations and leg swelling.  Gastrointestinal: Negative.  Negative for nausea and vomiting.  Genitourinary: Negative.  Negative for dysuria.  Musculoskeletal:  Positive for back pain. Negative for joint swelling.  Skin: Negative.  Negative for rash.  Neurological: Negative.  Negative for headaches.  Hematological: Negative.  Does not bruise/bleed easily.  Psychiatric/Behavioral: Negative.  Negative for dysphoric mood. The patient is not nervous/anxious.         Objective:   Physical Exam Vitals:   10/12/22 1301  BP: 120/68  Pulse: (!) 102  Temp: 97.9 F (36.6 C)  TempSrc: Oral  SpO2: 97%  Weight: 104 lb 3.2 oz (47.3 kg)  Height: 5\' 6"  (1.676 m)   Gen: stronger, thin woman, well-nourished, in no distress  ENT: No lesions,  mouth clear,  oropharynx clear, no postnasal drip  Neck: No JVD, no stridor  Lungs: No use of accessory muscles, coarse R lung with some inspiratory and expiratory rhonchi. L  is clear  Cardiovascular: RRR, heart sounds normal, no murmur or gallops, no peripheral edema  Musculoskeletal: No deformities, no cyanosis or clubbing  Neuro: alert, non focal  Skin: Warm, no lesions or rash      Assessment & Plan:  Mycobacterium avium infection ([place]) With a profoundly abnormal right lung on most recent CT scan of the chest.  The residual bronchiectatic right basilar airspace is now opacified, presumed mycobacterial pneumonia.  Her constitutional  symptoms and cough have improved on azithromycin and ethambutol which would argue against a superimposed bacterial infection.  Certainly she should be treated for bacterial pneumonia if she has recurrent cough, sputum production, progressive symptoms.  I explained to her that I am concerned that she will not regain function in the affected right basilar lung.  Will have to follow her clinically and by imaging to see how much can be salvaged.  This is likely the explanation of her persistent significant dyspnea.  We will check a walking oximetry today to ensure that she is not desaturating given the loss of that lung function.  I did broach the subject of a possible completion right pneumonectomy with her.  This would hopefully decrease the risk that her mycobacterial disease will continue to spillover onto the left, may give her an opportunity to someday come off antibiotics.  I do not think she will ever come off suppressive antibiotics as long as her right lung is so affected.  Agree with your antibiotics as you have been taking them and follow-up with Dr. Megan Salon with ID We reviewed your CT scan of the chest today. We will plan to repeat your CT chest in June 2024 to compare. Please continue to keep your albuterol available use 2 puffs if needed for shortness of breath, chest tightness, wheezing. We will perform a walking oximetry today on room air Please let us know immediately if you develop any new constitutional symptoms, increased cough, increased mucus production or change in the color of your mucus. Follow Dr. Lamonte Sakai in June after your CT so we can review the results together, sooner if you have any problems.  Time spent 31 minutes  Baltazar Apo, MD, PhD 10/12/2022, 1:29 PM [facility] [phone] or if no answer [phone]

## 2022-10-12 NOTE — Assessment & Plan Note (Signed)
With a profoundly abnormal right lung on most recent CT scan of the chest.  The residual bronchiectatic right basilar airspace is now opacified, presumed mycobacterial pneumonia.  Her constitutional  symptoms and cough have improved on azithromycin and ethambutol which would argue against a superimposed bacterial infection.  Certainly she should be treated for bacterial pneumonia if she has recurrent cough, sputum production, progressive symptoms.  I explained to her that I am concerned that she will not regain function in the affected right basilar lung.  Will have to follow her clinically and by imaging to see how much can be salvaged.  This is likely the explanation of her persistent significant dyspnea.  We will check a walking oximetry today to ensure that she is not desaturating given the loss of that lung function.  I did broach the subject of a possible completion right pneumonectomy with her.  This would hopefully decrease the risk that her mycobacterial disease will continue to spillover onto the left, may give her an opportunity to someday come off antibiotics.  I do not think she will ever come off suppressive antibiotics as long as her right lung is so affected.  Agree with your antibiotics as you have been taking them and follow-up with Dr. Megan Salon with ID We reviewed your CT scan of the chest today. We will plan to repeat your CT chest in June 2024 to compare. Please continue to keep your albuterol available use 2 puffs if needed for shortness of breath, chest tightness, wheezing. We will perform a walking oximetry today on room air Please let us know immediately if you develop any new constitutional symptoms, increased cough, increased mucus production or change in the color of your mucus. Follow Dr. Lamonte Sakai in June after your CT so we can review the results together, sooner if you have any problems.

## 2022-10-12 NOTE — Patient Instructions (Addendum)
Agree with your antibiotics as you have been taking them and follow-up with Dr. Megan Salon with ID We reviewed your CT scan of the chest today. We will plan to repeat your CT chest in June 2024 to compare. Please continue to keep your albuterol available use 2 puffs if needed for shortness of breath, chest tightness, wheezing. We will perform a walking oximetry today on room air Please let us know immediately if you develop any new constitutional symptoms, increased cough, increased mucus production or change in the color of your mucus. Follow Dr. Lamonte Sakai in June after your CT so we can review the results together, sooner if you have any problems.

## 2022-10-24 ENCOUNTER — Telehealth: Payer: Self-pay | Admitting: Pharmacist

## 2022-10-24 DIAGNOSIS — A310 Pulmonary mycobacterial infection: Secondary | ICD-10-CM

## 2022-10-24 MED ORDER — CLOFAZIMINE 50 MG PO CAPS - FOR COMPASSIONATE USE: 100.0000 mg | ORAL_CAPSULE | Freq: Every day | ORAL | 1 refills | Status: AC

## 2022-10-24 NOTE — Telephone Encounter (Signed)
2 bottles of clofazimine are located in the pharmacy office when patient needs a refill.  Merryl Buckels L. Shaqueena Mauceri, PharmD, BCIDP, AAHIVP, CPP Clinical Pharmacist Practitioner Infectious Diseases Clinical Pharmacist [facility] 10/24/2022, 3:22 PM

## 2022-10-26 ENCOUNTER — Ambulatory Visit (INDEPENDENT_AMBULATORY_CARE_PROVIDER_SITE_OTHER): Payer: Medicare Other | Admitting: Internal Medicine

## 2022-10-26 ENCOUNTER — Other Ambulatory Visit: Payer: Self-pay

## 2022-10-26 ENCOUNTER — Encounter: Payer: Self-pay | Admitting: Internal Medicine

## 2022-10-26 VITALS — BP 106/74 | HR 99 | Temp 97.5°F | Ht 66.0 in | Wt 102.0 lb

## 2022-10-26 DIAGNOSIS — A310 Pulmonary mycobacterial infection: Secondary | ICD-10-CM

## 2022-10-26 NOTE — Progress Notes (Signed)
[facility]  Patient Active Problem List   Diagnosis Date Noted   Mycobacterium avium infection 02/11/2020    Priority: High   Hearing loss 11/04/2020    Priority: Medium    Intractable nausea and vomiting 02/20/2020    Priority: Medium    Weight loss, non-intentional 09/20/2019    Priority: Medium    Diarrhea 04/21/2022   Posterior vitreous detachment of both eyes 03/10/2022   Localized osteoporosis with current pathological fracture 08/25/2021   Skin rash 02/28/2020   Hyponatremia    Dehydration with hyponatremia 02/20/2020   Low back pain 02/20/2020   Chronic diastolic CHF (congestive heart failure) 02/20/2020   Essential hypertension 02/20/2020   Acute metabolic encephalopathy 02/20/2020   Right epiretinal membrane 01/30/2020   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   Nuclear sclerotic cataract of right eye 01/30/2020   Nuclear sclerotic cataract of left eye 01/30/2020   Iron deficiency anemia 01/20/2020   Malignant neoplasm of overlapping sites of right breast in female, estrogen receptor positive 09/20/2019   Postprocedural pneumothorax    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   Port-A-Cath in place 04/26/2018   Genetic testing 02/20/2018   Family history of breast cancer    Family history of lung cancer    Personal history of lung cancer    Chest pain 11/28/2017   Abnormal CT of the chest 11/30/2016   Malignant neoplasm of upper-inner quadrant of left breast in female, estrogen receptor positive 06/16/2014   Osteopenia 09/13/2013   Postmenopausal atrophic vaginitis 09/13/2013   Vitamin D deficiency 09/13/2013   Malignant neoplasm of lower lobe of right lung 07/24/2012   Anxiety 03/11/2011   History of neutropenia 03/11/2011   Family history of colon cancer 03/11/2011   Cough 11/15/2010   Bronchiectasis without complication 11/15/2010    Patient's Medications  New Prescriptions   No medications on file  Previous Medications    ACYCLOVIR (ZOVIRAX) 400 MG TABLET    TAKE 1 TABLET BY MOUTH TWICE DAILY   ALBUTEROL (VENTOLIN HFA) 108 (90 BASE) MCG/ACT INHALER    INHALE 2 PUFFS INTO LUNGS EVERY 6 HOURS AS NEEDED FOR WHEEZING OR SHORTNESS OF BREATH.   AZITHROMYCIN (ZITHROMAX) 250 MG TABLET    Take 1 tablet (250 mg total) by mouth daily.   CALCIUM CARBONATE-VIT D-MIN (CALCIUM 1200 PO)    Take by mouth daily.   CHOLECALCIFEROL (D3 2000 PO)    Take by mouth.   CLOFAZIMINE 50 MG CAPS CAPSULE (FOR COMPASSIONATE USE)    Take 2 capsules (100 mg total) by mouth daily with breakfast.   ETHAMBUTOL (MYAMBUTOL) 400 MG TABLET    Take 2 tablets (800 mg total) by mouth daily. 800 mg   HYDROCODONE-ACETAMINOPHEN (NORCO) 5-325 MG TABLET    One tab by mouth every 8 hours as needed for severe musculoskeletal pain. TAKE WITH FOOD   LETROZOLE (FEMARA) 2.5 MG TABLET    Take 1 tablet (2.5 mg total) by mouth daily.   PROMETHAZINE (PHENERGAN) 12.5 MG TABLET    Take 1 tablet (12.5 mg total) by mouth every 6 (six) hours as needed for nausea or vomiting.   ROSUVASTATIN (CRESTOR) 10 MG TABLET    Take 1 tablet (10 mg total) by mouth daily.   VITAMIN C (ASCORBIC ACID) 500 MG TABLET    Take 500 mg by mouth daily.  Modified Medications   No medications on file  Discontinued Medications   No medications on file    Subjective: Anna Valentine is  in for her routine follow-up visit.  She has struggled with chronic, smoldering Mycobacterium avium pneumonia for several years. She has a remote history of lung cancer requiring right lower lobectomy and bronchiectasis. She was diagnosed with Mycobacterium avium pneumonia and started on azithromycin, ethambutol and rifampin in 2021.  She was intolerant of rifampin with severe GI distress and was briefly hospitalized.  She improved with stopping antibiotic therapy at that time.     She started on a salvage regimen of azithromycin, ethambutol and clofazimine in January 2022.  She had significant symptomatic improvement.  A CT  scan this past July showed stable abnormalities with no progression of her pulmonary findings.  However, she remained culture positive and started to have hearing loss bilaterally, presumed related to azithromycin.  She also started having worsening diarrhea.  I had her stop taking her antibiotics on 04/21/2022.  Her diarrhea resolved promptly.     In January she noted gradually worsening DOE, then developed a cough that was mostly dry but occasionally productive. She did not have any documented fevers but felt cold and was having mild night sweats. Her appetite worsened and she started to lose weight gradually.   She met with Dr. Zackery Barefoot at [facility] on 08/30/2022.  He recommended that she restart azithromycin at a lower dose of 250 mg daily and to also restart ethambutol for her chronic, smoldering Mycobacterium avium pneumonia that has recently flared up again.   He recommended holding off on restarting clofazimine for now since it did not help her become culture negative previously.  He did say that if she has a suboptimal response to azithromycin and ethambutol to consider adding aerosolized amikacin.    She feels like she is tolerating her antibiotics well.  After her last visit here she decided to restart clofazimine on her own.  She no longer notes the muffled feeling with her hearing and has not experienced any further hearing loss that she is aware of.  I did receive a note from her audiologist who noted that there have been "slight improvement in air conduction thresholds" shortly before restarting antibiotics.  Her cough has resolved and she is unable to produce any sputum for repeat culture.  She has not had any further fever or night sweats.  She remains fatigued but says that her fatigue is slightly better.  Her nausea has improved and she is not requiring as much Phenergan.  She did not feel like Zofran was as effective.  She says that she is eating okay but is still  losing weight.  She is not taking any supplements.  Her biggest concern is worsening dyspnea on exertion.  She says that she is very dejected.  A friend had to go with her to the grocery store because she is unable to shop for groceries without sitting down.  She monitors her oxygen saturation and says that the lowest it goes is about 93% but she notes that her heart rate jumps up to around 115/min when walking.  Several years ago she tried pulmonary rehab but had to quit after she developed a spinal compression fracture.  A repeat chest CT scan on 09/30/2022 revealed:  IMPRESSION: 1. New consolidation and possible associated necrosis in the right lower lobe, indicative of pneumonia. 2. New peribronchovascular nodularity and ground-glass in the left lower lobe, likely due to endobronchial spread of infection. 3. Extensive cylindrical bronchiectasis bilaterally. 4. Aortic atherosclerosis (ICD10-I70.0). Coronary artery calcification.  She is scheduled to have  a follow-up CT scan sometime in June.   Review of Systems: Review of Systems  Constitutional:  Positive for malaise/fatigue and weight loss. Negative for chills, diaphoresis and fever.  HENT:  Positive for hearing loss.        Recently she has had some tenderness of her lower gums anteriorly.  She also has some intermittent discomfort in her left nares.  Respiratory:  Positive for shortness of breath. Negative for cough, hemoptysis and sputum production.   Cardiovascular:  Negative for chest pain.  Gastrointestinal:  Positive for nausea. Negative for abdominal pain, diarrhea and vomiting.  Musculoskeletal:  Positive for back pain.  Psychiatric/Behavioral:  Positive for depression.     Past Medical History:  Diagnosis Date   Anemia    hx of IDA   BRCA negative 2011   BRCA I/ II negative   Breast cancer 08/2009   LEFT=stage 2, rx with lumpectomy and xrt   Breast cancer 2019   RIGHT   Bronchiectasis    Chronic diastolic CHF  (congestive heart failure) 02/20/2020   Constipation    Essential hypertension 02/20/2020   Family history of adverse reaction to anesthesia    My Sister has nausea   Family history of breast cancer    Family history of colon cancer    Family history of lung cancer    GERD (gastroesophageal reflux disease)    not presently having symptoms   Hypertension    Iatrogenic pneumothorax 02/06/2019   Lung cancer 06/06/05   stage 1 poorly differentiated adenocarcinoma, s/p right lower lobectomy.   Mycobacterium avium complex    dx 2021   Osteopenia 09/13/2013   Osteoporosis    Personal history of lung cancer    Pneumonia    2007ish, walking pnemonia - 2009 ish   STD (sexually transmitted disease)    HSV   Vitamin D deficiency 09/13/2013    Social History   Tobacco Use   Smoking status: Former    Packs/day: 2.00    Years: 18.00    Additional pack years: 0.00    Total pack years: 36.00    Types: Cigarettes    Quit date: 07/12/1987    Years since quitting: 35.3   Smokeless tobacco: Never  Vaping Use   Vaping Use: Never used  Substance Use Topics   Alcohol use: Yes    Comment: occasionally   Drug use: No    Family History  Problem Relation Age of Onset   Allergies Mother    Asthma Mother    Lung cancer Mother    Breast cancer Mother 48       recurrence age 47   Colon cancer Father 75   Colon polyps Father 28   Prostate cancer Brother 68   Breast cancer Sister 65       Recurrence age 49 BRCA negative   Colon polyps Sister    Leukemia Sister    Breast cancer Sister 32   Prostate cancer Brother 29   Breast cancer Maternal Grandmother 37   Colon cancer Maternal Aunt    Leukemia Maternal Grandfather    Lung cancer Maternal Aunt    Breast cancer Cousin    Esophageal cancer Neg Hx    Rectal cancer Neg Hx    Stomach cancer Neg Hx     Allergies  Allergen Reactions   Rifampin Nausea And Vomiting   Taxol [Paclitaxel] Anaphylaxis   Paclitaxel (Protein-Bound) Hives   Tape  Itching   Abraxane [Paclitaxel Protein-Bound Part] Rash  Clarithromycin Rash    Objective: Vitals:   10/26/22 1122  BP: 106/74  Pulse: 99  Temp: (!) 97.5 F (36.4 C)  TempSrc: Temporal  SpO2: 98%  Weight: 102 lb (46.3 kg)  Height:  (1.676 m)   Body mass index is 16.46 kg/m.  Physical Exam Constitutional:      Comments: Her weight is down about about 10 pounds in the past 4 months she is in good spirits but clearly she has worried and frustrated.  Cardiovascular:     Rate and Rhythm: Normal rate and regular rhythm.     Heart sounds: No murmur heard. Pulmonary:     Effort: Pulmonary effort is normal.     Breath sounds: No wheezing, rhonchi or rales.     Comments: She has very distant breath sounds..       Problem List Items Addressed This Visit       High   Mycobacterium avium infection - Primary    She seems to be tolerating her current 3 drug regimen of lower dose azithromycin, ethambutol and clofazimine.  I am encouraged that her cough has resolved.  I am hopeful that her dyspnea on exertion will start to improve in the next 4 to 6 weeks.  I do not see a need to add aerosolized amikacin at this time.  I did encourage her to add snacks/supplements to her diet in an attempt to avoid losing further weight.  I wonder if she would benefit from pulmonary rehab.  I encouraged her to discuss this with Dr. Delton Coombes.  She will follow-up with my partner, Dr. Gwen Her Dam, after her follow-up CT scan in June.  She is already scheduled for follow-up with her audiologist.       Cliffton Asters, MD [facility] [phone] pager   [phone] cell 10/26/2022, 11:53 AM

## 2022-10-26 NOTE — Assessment & Plan Note (Signed)
She seems to be tolerating her current 3 drug regimen of lower dose azithromycin, ethambutol and clofazimine.  I am encouraged that her cough has resolved.  I am hopeful that her dyspnea on exertion will start to improve in the next 4 to 6 weeks.  I do not see a need to add aerosolized amikacin at this time.  I did encourage her to add snacks/supplements to her diet in an attempt to avoid losing further weight.  I wonder if she would benefit from pulmonary rehab.  I encouraged her to discuss this with Dr. Delton Coombes.  She will follow-up with my partner, Dr. Gwen Her Dam, after her follow-up CT scan in June.  She is already scheduled for follow-up with her audiologist.

## 2022-10-28 NOTE — Progress Notes (Signed)
Patient Care Team: Margaree Mackintosh, MD as PCP - General (Internal Medicine) Magrinat, Valentino Hue, MD (Inactive) as Consulting Physician (Oncology) Leslye Peer, MD as Consulting Physician (Pulmonary Disease) Denyse Dago, MD as Referring Physician Armbruster, Willaim Rayas, MD as Consulting Physician (Gastroenterology) Ria Comment, FNP (Family Medicine) Antony Blackbird, MD as Consulting Physician (Radiation Oncology) Emelia Loron, MD as Consulting Physician (General Surgery) Laurey Morale, MD as Consulting Physician (Cardiology) Bensimhon, Bevelyn Buckles, MD as Consulting Physician (Cardiology)  DIAGNOSIS: No diagnosis found.  SUMMARY OF ONCOLOGIC HISTORY: Oncology History  Malignant neoplasm of upper-inner quadrant of left breast in female, estrogen receptor positive  06/16/2014 Initial Diagnosis   Malignant neoplasm of upper-inner quadrant of left breast in female, estrogen receptor positive (HCC)    Genetic Testing   Positive genetic test result. 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 TP53 is associated with Li-Fraumeni syndrome. The STAT Breast cancer panel offered by [facility] includes sequencing and rearrangement analysis for the following 9 genes:  ATM, BRCA1, BRCA2, CDH1, CHEK2, PALB2, PTEN, STK11 and TP53. The results of her Common Hereditary Cancer Panel test are still pending.   Report date is 02/20/2018.   Positive genetic test result. 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 The Common Hereditary Cancers Panel offered by [facility] includes sequencing and/or deletion duplication testing of the following 47 genes: APC, ATM, AXIN2, BARD1, BMPR1A, BRCA1, BRCA2, BRIP1, CDH1, CDKN2A (p14ARF), CDKN2A (p16INK4a), CKD4, CHEK2, CTNNA1, DICER1, EPCAM (Deletion/duplication testing only), GREM1 (promoter region deletion/duplication testing only), KIT, MEN1, MLH1, MSH2,  MSH3, MSH6, MUTYH, NBN, NF1, NHTL1, PALB2, PDGFRA, PMS2, POLD1, POLE, PTEN, RAD50, RAD51C, RAD51D, SDHB, SDHC, SDHD, SMAD4, SMARCA4. STK11, TP53, TSC1, TSC2, and VHL.  The following genes were evaluated for sequence changes only: SDHA and HOXB13 c.251G>A variant only.  Report date is 02/22/2018.   04/27/2018 - 04/27/2018 Chemotherapy   The patient had trastuzumab (HERCEPTIN) 210 mg in sodium chloride 0.9 % 250 mL chemo infusion, 4 mg/kg = 210 mg, Intravenous,  Once, 1 of 16 cycles Administration: 210 mg (04/27/2018) PACLitaxel (TAXOL) 126 mg in sodium chloride 0.9 % 250 mL chemo infusion (</= /m2), 80 mg/m2 = 126 mg, Intravenous,  Once, 1 of 3 cycles Administration: 126 mg (04/27/2018)  for chemotherapy treatment.    05/03/2018 - 07/06/2018 Chemotherapy   The patient had PACLitaxel-protein bound (ABRAXANE) chemo infusion 150 mg, 100 mg/m2 = 150 mg, Intravenous, Once, 3 of 4 cycles Administration: 150 mg (05/03/2018), 150 mg (05/10/2018), 150 mg (05/17/2018), 150 mg (05/31/2018), 150 mg (06/08/2018), 150 mg (06/21/2018), 150 mg (06/28/2018), 150 mg (07/06/2018)  for chemotherapy treatment.    05/03/2018 - 06/08/2018 Chemotherapy   The patient had trastuzumab (HERCEPTIN) 105 mg in sodium chloride 0.9 % 250 mL chemo infusion, 2 mg/kg = 105 mg (100 % of original dose 2 mg/kg), Intravenous,  Once, 6 of 6 cycles Dose modification: 2 mg/kg (original dose 2 mg/kg, Cycle 1, Reason: Provider Judgment) Administration: 105 mg (05/03/2018), 105 mg (05/10/2018), 105 mg (05/17/2018), 105 mg (05/24/2018), 105 mg (05/31/2018), 105 mg (06/08/2018)  for chemotherapy treatment.    05/04/2018 - 05/04/2018 Chemotherapy   The patient had trastuzumab (HERCEPTIN) 105 mg in sodium chloride 0.9 % 250 mL chemo infusion, 2 mg/kg = 105 mg (25 % of original dose 8 mg/kg), Intravenous,  Once, 0 of 5 cycles Dose modification: 2 mg/kg (original dose 8 mg/kg, Cycle 1, Reason: Provider Judgment)  for chemotherapy  treatment.     07/13/2018 - 10/24/2018 Chemotherapy   The patient had trastuzumab (HERCEPTIN) 450 mg in sodium chloride 0.9 % 250 mL chemo infusion, 441 mg, Intravenous,  Once, 6 of 15 cycles Administration: 450 mg (07/13/2018), 300 mg (08/01/2018), 300 mg (10/03/2018), 300 mg (08/22/2018), 300 mg (09/12/2018), 300 mg (10/24/2018)  for chemotherapy treatment.      CHIEF COMPLIANT: Breast cancer surveillance  INTERVAL HISTORY: Anna Valentine is a 72 y.o. with the above mention triple positive breast cancer and history of homozygous p53 mutation. Currently on Anastrozole. She presents to the clinic today    ALLERGIES:  is allergic to rifampin, taxol [paclitaxel], paclitaxel (protein-bound), tape, abraxane [paclitaxel protein-bound part], and clarithromycin.  MEDICATIONS:  Current Outpatient Medications  Medication Sig Dispense Refill   acyclovir (ZOVIRAX) 400 MG tablet TAKE 1 TABLET BY MOUTH TWICE DAILY 120 tablet 5   albuterol (VENTOLIN HFA) 108 (90 Base) MCG/ACT inhaler INHALE 2 PUFFS INTO LUNGS EVERY 6 HOURS AS NEEDED FOR WHEEZING OR SHORTNESS OF BREATH. 8.5 g PRN   azithromycin (ZITHROMAX) 250 MG tablet Take 1 tablet (250 mg total) by mouth daily. 30 tablet 11   Calcium Carbonate-Vit D-Min (CALCIUM 1200 PO) Take by mouth daily.     Cholecalciferol (D3 2000 PO) Take by mouth.     clofazimine 50 mg CAPS capsule (for compassionate use) Take 2 capsules (100 mg total) by mouth daily with breakfast. 100 capsule 1   ethambutol (MYAMBUTOL) 400 MG tablet Take 2 tablets (800 mg total) by mouth daily. 800 mg 60 tablet 11   HYDROcodone-acetaminophen (NORCO) 5-325 MG tablet One tab by mouth every 8 hours as needed for severe musculoskeletal pain. TAKE WITH FOOD (Patient not taking: Reported on 10/12/2022) 15 tablet 0   letrozole (FEMARA) 2.5 MG tablet Take 1 tablet (2.5 mg total) by mouth daily. 90 tablet 3   promethazine (PHENERGAN) 12.5 MG tablet Take 1 tablet (12.5 mg total) by mouth every 6 (six) hours as needed for  nausea or vomiting. 30 tablet 11   rosuvastatin (CRESTOR) 10 MG tablet Take 1 tablet (10 mg total) by mouth daily. 30 tablet 11   vitamin C (ASCORBIC ACID) 500 MG tablet Take 500 mg by mouth daily.     No current facility-administered medications for this visit.    PHYSICAL EXAMINATION: ECOG PERFORMANCE STATUS: {CHL ONC ECOG PS:[id number]}  There were no vitals filed for this visit. There were no vitals filed for this visit.  BREAST:*** No palpable masses or nodules in either right or left breasts. No palpable axillary supraclavicular or infraclavicular adenopathy no breast tenderness or nipple discharge. (exam performed in the presence of a chaperone)  LABORATORY DATA:  I have reviewed the data as listed    Latest Ref Rng & Units 10/05/2022   11:52 AM 06/20/2022    9:56 AM 02/23/2022   10:39 AM  CMP  Glucose 65 - 99 mg/dL 85  92  91   BUN 7 - 25 mg/dL 13  14  15    Creatinine 0.60 - 1.00 mg/dL 1.32  4.40  1.02   Sodium 135 - 146 mmol/L 138  138  138   Potassium 3.5 - 5.3 mmol/L 4.1  4.5  4.3   Chloride 98 - 110 mmol/L 100  103  106   CO2 20 - 32 mmol/L 25  30  27    Calcium 8.6 - 10.4 mg/dL 9.6  9.3  8.8   Total Protein 6.1 - 8.1 g/dL 7.3  6.6  Total Bilirubin 0.2 - 1.2 mg/dL 0.3  0.5    AST 10 - 35 U/L 42  14    ALT 6 - 29 U/L 37  6      Lab Results  Component Value Date   WBC 11.9 (H) 10/05/2022   HGB 11.6 (L) 10/05/2022   HCT 35.4 10/05/2022   MCV 88.1 10/05/2022   PLT 392 10/05/2022   NEUTROABS 3,540 06/20/2022    ASSESSMENT & PLAN:  No problem-specific Assessment & Plan notes found for this encounter.    No orders of the defined types were placed in this encounter.  The patient has a good understanding of the overall plan. she agrees with it. she will call with any problems that may develop before the next visit here. Total time spent: 30 mins including face to face time and time spent for planning, charting and co-ordination of care   Sherlyn Lick,  CMA 10/28/22    I Janan Ridge am acting as a Neurosurgeon for [employer]  ***

## 2022-11-02 ENCOUNTER — Other Ambulatory Visit: Payer: Self-pay

## 2022-11-02 ENCOUNTER — Inpatient Hospital Stay: Payer: Medicare Other | Attending: Hematology and Oncology | Admitting: Hematology and Oncology

## 2022-11-02 ENCOUNTER — Inpatient Hospital Stay: Payer: Medicare Other

## 2022-11-02 VITALS — BP 123/72 | HR 104 | Temp 97.9°F | Resp 18 | Ht 66.0 in | Wt 104.6 lb

## 2022-11-02 DIAGNOSIS — Z79811 Long term (current) use of aromatase inhibitors: Secondary | ICD-10-CM | POA: Diagnosis not present

## 2022-11-02 DIAGNOSIS — Z79899 Other long term (current) drug therapy: Secondary | ICD-10-CM | POA: Insufficient documentation

## 2022-11-02 DIAGNOSIS — Z170 Estrogen receptor positive status [ER+]: Secondary | ICD-10-CM | POA: Diagnosis not present

## 2022-11-02 DIAGNOSIS — C50212 Malignant neoplasm of upper-inner quadrant of left female breast: Secondary | ICD-10-CM | POA: Insufficient documentation

## 2022-11-02 MED ORDER — LETROZOLE 2.5 MG PO TABS: 2.5000 mg | ORAL_TABLET | Freq: Every day | ORAL | 2 refills | Status: DC

## 2022-11-02 NOTE — Assessment & Plan Note (Signed)
06/06/2005: Right upper lobe lung wedge resection with mediastinal lymph node dissection: 1.2 cm grade 3 adenocarcinoma T1N1 stage Ia (followed at [facility]) ------------------------------------------------------------ Left breast cancer 09/02/2009: Invasive lobular cancer ER 98%, PR 96%, Ki-67 14%, HER2 negative 10/12/2009: Left lumpectomy with sentinel lymph node biopsy: T1BN1 stage IIa invasive lobular cancer (Oncotype DX 16: ROR 10%) 01/28/2010: Adjuvant radiation July 2011-July 2016: Anastrozole  ------------------------------------------------ Right breast cancer 01/29/2018: T1CN0 stage Ia IDC grade 2 ER positive PR negative HER2 amplified Ki-67 15% 03/15/2018: Bilateral mastectomies, right breast T1CN0 stage Ia grade 2 IDC 0/6 lymph nodes 04/26/2018: Taxol Herceptin (switch to Abraxane because of initial reaction, discontinued 07/13/2018) completed 10 cycles   Current treatment: Tamoxifen started 08/01/2018 switched to anastrozole 04/19/2021 because of blood clot concerns Osteoporosis: Bone density 02/21/2012 at [facility] T score -2.5: Will be starting Prolia (with her PCP) along with calcium and vitamin D She will complete antiestrogen therapy by December 2025   Genetics: T p53 pathogenic variant   Breast cancer surveillance: 1.  Breast exam 11/02/2022: Benign 2. no role of mammograms since she had bilateral mastectomies   Return to clinic in 1 year for follow-up

## 2022-11-09 DIAGNOSIS — Z85828 Personal history of other malignant neoplasm of skin: Secondary | ICD-10-CM | POA: Diagnosis not present

## 2022-11-09 DIAGNOSIS — L853 Xerosis cutis: Secondary | ICD-10-CM | POA: Diagnosis not present

## 2022-11-09 DIAGNOSIS — L821 Other seborrheic keratosis: Secondary | ICD-10-CM | POA: Diagnosis not present

## 2022-11-09 DIAGNOSIS — L570 Actinic keratosis: Secondary | ICD-10-CM | POA: Diagnosis not present

## 2022-11-18 ENCOUNTER — Encounter: Payer: Self-pay | Admitting: Emergency Medicine

## 2022-11-24 NOTE — Telephone Encounter (Signed)
VOB initiated °

## 2022-11-29 NOTE — Telephone Encounter (Signed)
Pt ready for scheduling on or after 12/29/22  Out-of-pocket cost due at time of visit: $0  Primary: Medicare Prolia co-insurance: 20% (approximately $302) Admin fee co-insurance: 20% (approximately $25)  Deductible: $240 of $240 met  Secondary: [employer] Medicare Supp Plan G Prolia co-insurance: Covers Medicare Part B co-insurance Admin fee co-insurance: Covers Medicare Part B co-insurance  Deductible: does NOT cover Medicare deductible of which $240 of $240 has been met  Prior Auth: NOT required PA# Valid:   ** This summary of benefits is an estimation of the patient's out-of-pocket cost. Exact cost may vary based on individual plan coverage.

## 2022-12-01 NOTE — Progress Notes (Signed)
ADVANCED HF CLINIC NOTE  Oncology: Dr. Darnelle Catalan Cardiology: Dr. Shirlee Latch  72 y.o. with history of COPD, prior lung cancer, and breast cancer was referred by Dr. Darnelle Catalan for cardio-oncology evaluation given use of Herceptin. Patient had breast cancer diagnosed on left in 2/11, ER+/PR+/HER2-.  She had left lumpectomy and radiation.  Diagnosed in 7/19 with right breast cancer, ER+/PR-/HER2+.  She had bilateral mastectomy.  Plan initially for paclitaxel x 12 cycles and Herceptin x 1 year.  Intolerant of paclitaxel, switched to abraxane.    She had atypical chest pain in 6/19, Cardiolite at that time showed no ischemia.  Other than that incident, no cardiac history.  She is a prior smoker but has quit.  No exertional dyspnea or chest pain.    Echo in 3/20 showed significantly less negative strain, and Herceptin was held.  There is no plan to restart Herceptin.  She was started on Coreg. Repeat echo in 4/20 showed EF down to 50-55%, losartan was added.  Repeat echo in 6/20 showed EF lower again at 45-50%.  Echo in 8/20 showed EF 55%.  CTA chest in 9/22 showed thrombus in the right PA system at the stump from prior right lower lobectomy.    Echo 1/23: Technically difficult study with EF 55-60%, poorly viewed RV.   1/23 AHF f/u she stopped Coreg and losartan due to low BP.  No exertional chest pain.  She has chronic dyspnea related to MAC infection. This has improved with antibiotic treatment.    Today she returns for  AHF follow up. Overall feeling ok, watches what she does and limits herself. Had previously graduated from [facility] clinic [facility] but made an appt as she's been dealing with increased SOB. Now unable to go to the grocery store because she gets very SOB. Denies palpitations, CP, edema, or PND/Orthopnea. Some dizziness if she gets up too fast. Follows with Dr. Delton Coombes (pulmonology), has been treated for mycobacterium avium infection and was on chronic antibiotics for about 2 years, had to stop them in  October due to some hearing loss. SOB returned in January, now back on antibiotics. Appetite ok. No fever or chills. Weight at home 98-100 pounds. Taking all medications. Occasional glass of wine, no smoking.    ECG (personally reviewed): NSR 96 bpm  Labs (6/20): K 4.3, creatinine 0.77 Labs (7/20): K 4, creatinine 0.64, LDL 105 Labs (3/24): K 4.1, SCr .74  PMH: 1. Bronchiectasis: MAC noted.  2. Lung cancer: Adenocarcinoma.  In 11/06, had RUL wedge resection, right middle lobe wedge resection, and right lower lobectomy.  3. Breast cancer: Diagnosed on left in 2/11, ER+/PR+/HER2-.  She had left lumpectomy and radiation.  Diagnosed in 7/19 with right breast cancer, ER+/PR-/HER2+.  She had bilateral mastectomy.  Plan initially for paclitaxel x 12 cycles and Herceptin x 1 year.  Intolerant of paclitaxel, switched to abraxane.   - Echo (12/19): EF 55-60%, GLS -19%, normal RV size and systolic function, PASP 48 mmHg - Echo (3/20): EF 55%, GLS -13.3%. Normal RV size and systolic function.  - Echo (4/20): EF 50-55%, grade 2 diastolic dysfunction, RV normal. - Echo (6/20): EF 45-50%, grade 2 diastolic dysfunction, normal RV, GLS -17.7%.  - Echo (8/20): EF 55%, GLS -16.1%, normal RV.  - Echo (1/23): Technically difficult study with EF 55-60%, poorly viewed RV.  4. Chest pain: Cardiolite (6/19) with EF 75%, no ischemia.  5. CTA chest in 9/22 showed thrombus in the right PA system at the stump from prior right  lower lobectomy.   Social History   Socioeconomic History   Marital status: Single    Spouse name: Not on file   Number of children: 0   Years of education: Not on file   Highest education level: Not on file  Occupational History   Occupation: OFFICE MANAGER    Employer: [employer]  Tobacco Use   Smoking status: Former    Packs/day: 2.00    Years: 18.00    Additional pack years: 0.00    Total pack years: 36.00    Types: Cigarettes    Quit date: 07/12/1987    Years since quitting:  35.4   Smokeless tobacco: Never  Vaping Use   Vaping Use: Never used  Substance and Sexual Activity   Alcohol use: Yes    Comment: occasionally   Drug use: No   Sexual activity: Yes  Other Topics Concern   Not on file  Social History Narrative   Not on file   Social Determinants of Health   Financial Resource Strain: Low Risk  (05/26/2021)   Overall Financial Resource Strain (CARDIA)    Difficulty of Paying Living Expenses: Not hard at all  Food Insecurity: No Food Insecurity (05/26/2021)   Hunger Vital Sign    Worried About Running Out of Food in the Last Year: Never true    Ran Out of Food in the Last Year: Never true  Transportation Needs: No Transportation Needs (05/26/2021)   PRAPARE - Administrator, Civil Service (Medical): No    Lack of Transportation (Non-Medical): No  Physical Activity: Insufficiently Active (05/26/2021)   Exercise Vital Sign    Days of Exercise per Week: 3 days    Minutes of Exercise per Session: 20 min  Stress: No Stress Concern Present (05/26/2021)   [employer] of Occupational Health - Occupational Stress Questionnaire    Feeling of Stress : Not at all  Social Connections: Moderately Isolated (05/26/2021)   Social Connection and Isolation Panel [NHANES]    Frequency of Communication with Friends and Family: More than three times a week    Frequency of Social Gatherings with Friends and Family: More than three times a week    Attends Religious Services: More than 4 times per year    Active Member of [employer] or Organizations: No    Attends Banker Meetings: Never    Marital Status: Widowed  Intimate Partner Violence: Not At Risk (05/26/2021)   Humiliation, Afraid, Rape, and Kick questionnaire    Fear of Current or Ex-Partner: No    Emotionally Abused: No    Physically Abused: No    Sexually Abused: No   Family History  Problem Relation Age of Onset   Allergies Mother    Asthma Mother    Lung cancer Mother     Breast cancer Mother 69       recurrence age 2   Colon cancer Father 27   Colon polyps Father 74   Prostate cancer Brother 20   Breast cancer Sister 20       Recurrence age 22 BRCA negative   Colon polyps Sister    Leukemia Sister    Breast cancer Sister 15   Prostate cancer Brother 58   Breast cancer Maternal Grandmother 71   Colon cancer Maternal Aunt    Leukemia Maternal Grandfather    Lung cancer Maternal Aunt    Breast cancer Cousin    Esophageal cancer Neg Hx    Rectal cancer Neg  Hx    Stomach cancer Neg Hx    ROS: All systems reviewed and negative except as per HPI.   Current Outpatient Medications  Medication Sig Dispense Refill   acyclovir (ZOVIRAX) 400 MG tablet TAKE 1 TABLET BY MOUTH TWICE DAILY 120 tablet 5   albuterol (VENTOLIN HFA) 108 (90 Base) MCG/ACT inhaler INHALE 2 PUFFS INTO LUNGS EVERY 6 HOURS AS NEEDED FOR WHEEZING OR SHORTNESS OF BREATH. 8.5 g PRN   azithromycin (ZITHROMAX) 250 MG tablet Take 1 tablet (250 mg total) by mouth daily. 30 tablet 11   Calcium Carbonate-Vit D-Min (CALCIUM 1200 PO) Take by mouth daily.     Cholecalciferol (D3 2000 PO) Take by mouth.     clofazimine 50 mg CAPS capsule (for compassionate use) Take 2 capsules (100 mg total) by mouth daily with breakfast. 100 capsule 1   ethambutol (MYAMBUTOL) 400 MG tablet Take 2 tablets (800 mg total) by mouth daily. 800 mg 60 tablet 11   letrozole (FEMARA) 2.5 MG tablet Take 1 tablet (2.5 mg total) by mouth daily. 90 tablet 2   promethazine (PHENERGAN) 12.5 MG tablet Take 1 tablet (12.5 mg total) by mouth every 6 (six) hours as needed for nausea or vomiting. 30 tablet 11   rosuvastatin (CRESTOR) 10 MG tablet Take 1 tablet (10 mg total) by mouth daily. 30 tablet 11   vitamin C (ASCORBIC ACID) 500 MG tablet Take 500 mg by mouth daily.     No current facility-administered medications for this encounter.   BP 130/78   Pulse 94   Wt 47.3 kg (104 lb 4 oz)   SpO2 99%   BMI 16.83 kg/m    Physical exam:  General:  well appearing.  No respiratory difficulty HEENT: normal Neck: supple. JVD ~6 cm. Carotids 2+ bilat; no bruits. No lymphadenopathy or thyromegaly appreciated. Cor: PMI nondisplaced. Regular rate & rhythm. No rubs, gallops or murmurs. Lungs: clear. Diminished RLL Abdomen: soft, nontender, nondistended. No hepatosplenomegaly. No bruits or masses. Good bowel sounds. Extremities: no cyanosis, clubbing, rash, edema  Neuro: alert & oriented x 3, cranial nerves grossly intact. moves all 4 extremities w/o difficulty. Affect pleasant.   Assessment/Plan: SOB - suspect 2/2 mycobacterium avium infection. Follows with Dr. Delton Coombes, now back on abx. Gets frequent chest CTs, next one scheduled for 7/24 - Will check echo to r/o cardiac etiology, suspect pulmonary is source of SOB but will update echo in the mean time.  2. Breast cancer: Concern in 3/20 for cardiotoxicity from Herceptin, it was stopped.  Per Dr. Darnelle Catalan, did not rechallenge.  Echo 1/23 EF 55-60% though study was difficult.    - She is off Coreg and losartan with hypotension and can stay off them.  3. Pulmonary hypertension: By a prior echo, PASP 48 mmHg.  She has a history of bronchiectasis and also of extensive right lung resections for lung cancer.  Suspect group 3 pulmonary hypertension from lung disease.  - update echo - BMET, BNP today 4. CAD: Significant coronary calcification noted on CTs.   - Continue Crestor 10 mg daily.   5. Pulmonary embolus: Noted at site of stump in PA system from prior right lower lobectomy.  - Now off Eliquis.   Follow up 3 months with Dr. Shirlee Latch. Update echo in meantime.   Alen Bleacher AGACNP-BC  12/02/2022 [facility] [address] Heart and Vascular [place] [address] [phone] (office) [phone] (fax)

## 2022-12-02 ENCOUNTER — Encounter (HOSPITAL_COMMUNITY): Payer: Self-pay

## 2022-12-02 ENCOUNTER — Ambulatory Visit (HOSPITAL_COMMUNITY)
Admission: RE | Admit: 2022-12-02 | Discharge: 2022-12-02 | Disposition: A | Discharge status: Home / Self Care | Payer: Medicare Other | Source: Ambulatory Visit | Attending: Family Medicine | Admitting: Family Medicine

## 2022-12-02 VITALS — BP 130/78 | HR 94 | Wt 104.2 lb

## 2022-12-02 DIAGNOSIS — R0600 Dyspnea, unspecified: Secondary | ICD-10-CM | POA: Insufficient documentation

## 2022-12-02 DIAGNOSIS — Z853 Personal history of malignant neoplasm of breast: Secondary | ICD-10-CM | POA: Insufficient documentation

## 2022-12-02 DIAGNOSIS — I2510 Atherosclerotic heart disease of native coronary artery without angina pectoris: Secondary | ICD-10-CM | POA: Diagnosis not present

## 2022-12-02 DIAGNOSIS — Z86711 Personal history of pulmonary embolism: Secondary | ICD-10-CM | POA: Insufficient documentation

## 2022-12-02 DIAGNOSIS — Z85118 Personal history of other malignant neoplasm of bronchus and lung: Secondary | ICD-10-CM | POA: Diagnosis not present

## 2022-12-02 DIAGNOSIS — Z87891 Personal history of nicotine dependence: Secondary | ICD-10-CM | POA: Insufficient documentation

## 2022-12-02 DIAGNOSIS — R0602 Shortness of breath: Secondary | ICD-10-CM | POA: Diagnosis not present

## 2022-12-02 DIAGNOSIS — J449 Chronic obstructive pulmonary disease, unspecified: Secondary | ICD-10-CM | POA: Diagnosis not present

## 2022-12-02 DIAGNOSIS — Z79899 Other long term (current) drug therapy: Secondary | ICD-10-CM | POA: Diagnosis not present

## 2022-12-02 DIAGNOSIS — I272 Pulmonary hypertension, unspecified: Secondary | ICD-10-CM | POA: Insufficient documentation

## 2022-12-02 DIAGNOSIS — C50919 Malignant neoplasm of unspecified site of unspecified female breast: Secondary | ICD-10-CM | POA: Diagnosis not present

## 2022-12-02 DIAGNOSIS — I2699 Other pulmonary embolism without acute cor pulmonale: Secondary | ICD-10-CM | POA: Diagnosis not present

## 2022-12-02 LAB — BASIC METABOLIC PANEL
Anion gap: 9 (ref 5–15)
BUN: 13 mg/dL (ref 8–23)
CO2: 24 mmol/L (ref 22–32)
Calcium: 8.6 mg/dL — ABNORMAL LOW (ref 8.9–10.3)
Chloride: 103 mmol/L (ref 98–111)
Creatinine, Ser: 0.82 mg/dL (ref 0.44–1.00)
GFR, Estimated: >60 mL/min (ref >60)
Glucose, Bld: 86 mg/dL (ref 70–99)
Potassium: 3.8 mmol/L (ref 3.5–5.1)
Sodium: 136 mmol/L (ref 135–145)

## 2022-12-02 LAB — BRAIN NATRIURETIC PEPTIDE: B Natriuretic Peptide: 21.4 pg/mL (ref 0.0–100.0)

## 2022-12-02 NOTE — Progress Notes (Signed)
ReDS Vest / Clip - 12/02/22 1100       ReDS Vest / Clip   Station Marker C    Ruler Value 21    Anatomical Comments Low Quality x 3 attempts

## 2022-12-02 NOTE — Patient Instructions (Addendum)
EKG done today.   Labs done today. We will contact you only if your labs are abnormal.  No medication changes were made. Please continue all current medications as prescribed.  Your physician recommends that you schedule a follow-up appointment in: 3 months with an echo prior to your appointment.  Your physician has requested that you have an echocardiogram. Echocardiography is a painless test that uses sound waves to create images of your heart. It provides your doctor with information about the size and shape of your heart and how well your heart's chambers and valves are working. This procedure takes approximately one hour. There are no restrictions for this procedure. Please do NOT wear cologne, perfume, aftershave, or lotions (deodorant is allowed). Please arrive 15 minutes prior to your appointment time.  If you have any questions or concerns before your next appointment please send [place] a message through [place] or call our office at [phone].    TO LEAVE A MESSAGE FOR THE NURSE SELECT OPTION 2, PLEASE LEAVE A MESSAGE INCLUDING: YOUR NAME DATE OF BIRTH CALL BACK NUMBER REASON FOR CALL**this is important as we prioritize the call backs  YOU WILL RECEIVE A CALL BACK THE SAME DAY AS LONG AS YOU CALL BEFORE 4:00 PM   Do the following things EVERYDAY: Weigh yourself in the morning before breakfast. Write it down and keep it in a log. Take your medicines as prescribed Eat low salt foods--Limit salt (sodium) to 2000 mg per day.  Stay as active as you can everyday Limit all fluids for the day to less than 2 liters   At the [facility], you and your health needs are our priority. As part of our continuing mission to provide you with exceptional heart care, we have created designated Provider Care Teams. These Care Teams include your primary Cardiologist (physician) and Advanced Practice Providers (APPs- Physician Assistants and Nurse Practitioners) who all work  together to provide you with the care you need, when you need it.   You may see any of the following providers on your designated Care Team at your next follow up: Dr Arvilla Meres Dr Marca Ancona Dr. Marcos Eke, NP Robbie Lis, [place] [facility] [place], [place] Brynda Peon, NP Karle Plumber, PharmD   Please be sure to bring in all your medications bottles to every appointment.    Thank you for choosing [facility]

## 2022-12-16 ENCOUNTER — Other Ambulatory Visit (HOSPITAL_COMMUNITY): Payer: Self-pay

## 2022-12-16 DIAGNOSIS — I5032 Chronic diastolic (congestive) heart failure: Secondary | ICD-10-CM

## 2022-12-19 ENCOUNTER — Ambulatory Visit (HOSPITAL_COMMUNITY)
Admission: RE | Admit: 2022-12-19 | Discharge: 2022-12-19 | Disposition: A | Discharge status: Home / Self Care | Payer: Medicare Other | Source: Ambulatory Visit | Attending: Cardiology | Admitting: Cardiology

## 2022-12-19 DIAGNOSIS — I2510 Atherosclerotic heart disease of native coronary artery without angina pectoris: Secondary | ICD-10-CM | POA: Diagnosis not present

## 2022-12-19 DIAGNOSIS — I5032 Chronic diastolic (congestive) heart failure: Secondary | ICD-10-CM | POA: Diagnosis not present

## 2022-12-19 DIAGNOSIS — C3490 Malignant neoplasm of unspecified part of unspecified bronchus or lung: Secondary | ICD-10-CM | POA: Insufficient documentation

## 2022-12-19 LAB — ECHOCARDIOGRAM COMPLETE
AR max vel: 2.19 cm2
AV Area VTI: 2.14 cm2
AV Area mean vel: 2.12 cm2
AV Mean grad: 2 mmHg
AV Peak grad: 4.6 mmHg
Ao pk vel: 1.08 m/s
Area-P 1/2: 4.36 cm2
Calc EF: 55.5 %
S' Lateral: 2.8 cm
Single Plane A2C EF: 53.9 %
Single Plane A4C EF: 56 %

## 2022-12-21 ENCOUNTER — Telehealth: Payer: Self-pay | Admitting: Emergency Medicine

## 2022-12-21 NOTE — Telephone Encounter (Signed)
Pt calling in to get scheduled for CT Scan, she was told she would get a call back from one of PCCs but says didn't get any call yet and Dr. Delton Coombes wanted it done by June of 2024

## 2022-12-26 ENCOUNTER — Ambulatory Visit (INDEPENDENT_AMBULATORY_CARE_PROVIDER_SITE_OTHER): Payer: Medicare Other | Admitting: Infectious Disease

## 2022-12-26 ENCOUNTER — Telehealth: Payer: Self-pay | Admitting: Pharmacist

## 2022-12-26 ENCOUNTER — Other Ambulatory Visit: Payer: Self-pay

## 2022-12-26 ENCOUNTER — Encounter: Payer: Self-pay | Admitting: Infectious Disease

## 2022-12-26 VITALS — BP 121/80 | HR 93 | Temp 97.7°F | Ht 66.0 in | Wt 103.0 lb

## 2022-12-26 DIAGNOSIS — A310 Pulmonary mycobacterial infection: Secondary | ICD-10-CM

## 2022-12-26 DIAGNOSIS — C3490 Malignant neoplasm of unspecified part of unspecified bronchus or lung: Secondary | ICD-10-CM

## 2022-12-26 DIAGNOSIS — C50212 Malignant neoplasm of upper-inner quadrant of left female breast: Secondary | ICD-10-CM | POA: Diagnosis not present

## 2022-12-26 DIAGNOSIS — R7401 Elevation of levels of liver transaminase levels: Secondary | ICD-10-CM | POA: Diagnosis not present

## 2022-12-26 DIAGNOSIS — Z170 Estrogen receptor positive status [ER+]: Secondary | ICD-10-CM

## 2022-12-26 DIAGNOSIS — J479 Bronchiectasis, uncomplicated: Secondary | ICD-10-CM

## 2022-12-26 DIAGNOSIS — R0602 Shortness of breath: Secondary | ICD-10-CM

## 2022-12-26 DIAGNOSIS — J189 Pneumonia, unspecified organism: Secondary | ICD-10-CM

## 2022-12-26 HISTORY — DX: Elevation of levels of liver transaminase levels: R74.01

## 2022-12-26 LAB — COMPLETE METABOLIC PANEL WITH GFR
Alkaline phosphatase (APISO): 82 U/L (ref 37–153)
BUN: 15 mg/dL (ref 7–25)
Calcium: 9.3 mg/dL (ref 8.6–10.4)
Chloride: 103 mmol/L (ref 98–110)
Total Protein: 6.7 g/dL (ref 6.1–8.1)
eGFR: 95 mL/min/{1.73_m2} (ref >=60)

## 2022-12-26 NOTE — Progress Notes (Signed)
Chief complaint: follow-up for M avium infection, still with some dyspnea on exertion  Subjective:    Patient ID: Anna Valentine, female    DOB: 02/26/1951, 72 y.o.   MRN: [id number]  HPI  Anna Valentine is a 72 year old woman with chronic M avium pulmonary infection, previously followed by my partner Dr. Cliffton Asters who has retired this April of 2024  Dr. Modena Nunnery recounts her history below:  "She has a remote history of lung cancer requiring right lower lobectomy and bronchiectasis. She was diagnosed with Mycobacterium avium pneumonia and started on azithromycin, ethambutol and rifampin in 2021.  She was intolerant of rifampin with severe GI distress and was briefly hospitalized.  She improved with stopping antibiotic therapy at that time.     She started on a salvage regimen of azithromycin, ethambutol and clofazimine in January 2022.  She had significant symptomatic improvement.  A CT scan this past July showed stable abnormalities with no progression of her pulmonary findings.  However, she remained culture positive and started to have hearing loss bilaterally, presumed related to azithromycin.  She also started having worsening diarrhea.  I had her stop taking her antibiotics on 04/21/2022.  Her diarrhea resolved promptly.     In January she noted gradually worsening DOE, then developed a cough that was mostly dry but occasionally productive. She did not have any documented fevers but felt cold and was having mild night sweats. Her appetite worsened and she started to lose weight gradually.   She met with Dr. Zackery Barefoot at [facility] on 08/30/2022.  He recommended that she restart azithromycin at a lower dose of 250 mg daily and to also restart ethambutol for her chronic, smoldering Mycobacterium avium pneumonia that has recently flared up again.   He recommended holding off on restarting clofazimine for now since it did not help her become culture negative previously.   He did say that if she has a suboptimal response to azithromycin and ethambutol to consider adding aerosolized amikacin.     She feels like she is tolerating her antibiotics well.  After her last visit here she decided to restart clofazimine on her own.  She no longer notes the muffled feeling with her hearing and has not experienced any further hearing loss that she is aware of.  I did receive a note from her audiologist who noted that there have been "slight improvement in air conduction thresholds" shortly before restarting antibiotics.  Her cough has resolved and she is unable to produce any sputum for repeat culture.  She has not had any further fever or night sweats.  She remains fatigued but says that her fatigue is slightly better.  Her nausea has improved and she is not requiring as much Phenergan.  She did not feel like Zofran was as effective.  She says that she is eating okay but is still losing weight.  She is not taking any supplements.  Her biggest concern is worsening dyspnea on exertion.  She says that she is very dejected.  A friend had to go with her to the grocery store because she is unable to shop for groceries without sitting down.  She monitors her oxygen saturation and says that the lowest it goes is about 93% but she notes that her heart rate jumps up to around 115/min when walking.  Several years ago she tried pulmonary rehab but had to quit after she developed a spinal compression fracture.   A repeat chest CT  scan on 09/30/2022 revealed:   IMPRESSION: 1. New consolidation and possible associated necrosis in the right lower lobe, indicative of pneumonia. 2. New peribronchovascular nodularity and ground-glass in the left lower lobe, likely due to endobronchial spread of infection. 3. Extensive cylindrical bronchiectasis bilaterally. 4. Aortic atherosclerosis (ICD10-I70.0). Coronary artery calcification."  She was continued on her lower dose with Romycin along with clofazimine  and ethambutol.  Scheduled to have CT of the lungs but this is not yet been performed.  My understanding is there is been contemplation of resection of part of her lung, is indeed documented in Dr. Kavin Leech note.  He has concerned that she will never regain function in the right basilar segment of the lung and that this will be a persistent source of her mycobacterial infection and also a reason for her dyspnea.  She has follow-up CT scan scheduled and follow-up appointment with him scheduled.  She is doing well without coughing whatsoever.  She does continue to have the dyspnea as she had before.     Past Medical History:  Diagnosis Date   Anemia    hx of IDA   BRCA negative 2011   BRCA I/ II negative   Breast cancer (HCC) 08/2009   LEFT=stage 2, rx with lumpectomy and xrt   Breast cancer (HCC) 2019   RIGHT   Bronchiectasis (HCC)    Chronic diastolic CHF (congestive heart failure) (HCC) 02/20/2020   Constipation    Essential hypertension 02/20/2020   Family history of adverse reaction to anesthesia    My Sister has nausea   Family history of breast cancer    Family history of colon cancer    Family history of lung cancer    GERD (gastroesophageal reflux disease)    not presently having symptoms   Hypertension    Iatrogenic pneumothorax 02/06/2019   Lung cancer (HCC) 06/06/05   stage 1 poorly differentiated adenocarcinoma, s/p right lower lobectomy.   Mycobacterium avium complex (HCC)    dx 2021   Osteopenia 09/13/2013   Osteoporosis    Personal history of lung cancer    Pneumonia    2007ish, walking pnemonia - 2009 ish   STD (sexually transmitted disease)    HSV   Vitamin D deficiency 09/13/2013    Past Surgical History:  Procedure Laterality Date   BREAST BIOPSY     BREAST LUMPECTOMY  10/12/2009   Left lumpectomy and radiation, stage II, ER/PR+, Her 2 nu negative   BUNIONECTOMY Bilateral    COLONOSCOPY  2018   SA-MAC-suprep-no polyps   COLONOSCOPY     LOBECTOMY  Right 06/06/2005   [place]   MASTECTOMY W/ SENTINEL NODE BIOPSY Bilateral 03/15/2018   MASTECTOMY W/ SENTINEL NODE BIOPSY Bilateral 03/15/2018   Procedure: BILATERAL TOTAL MASTECTOMIES WITH RIGHT SENTINEL LYMPH NODE BIOPSY;  Surgeon: Emelia Loron, MD;  Location: [facility] OR;  Service: General;  Laterality: Bilateral;   PORTACATH PLACEMENT N/A 03/15/2018   Procedure: INSERTION PORT-A-CATH WITH [place];  Surgeon: Emelia Loron, MD;  Location: [facility] OR;  Service: General;  Laterality: N/A;   TONSILLECTOMY     TUBAL LIGATION  1984   VIDEO BRONCHOSCOPY  02/06/2019   Flexible video fiberoptic bronchoscopy with electromagnetic navigation and biopsies.   VIDEO BRONCHOSCOPY WITH ENDOBRONCHIAL NAVIGATION Left 02/06/2019   Procedure: VIDEO BRONCHOSCOPY WITH ENDOBRONCHIAL NAVIGATION, left lung;  Surgeon: Leslye Peer, MD;  Location: [facility] OR;  Service: Thoracic;  Laterality: Left;   VIDEO BRONCHOSCOPY WITH ENDOBRONCHIAL NAVIGATION N/A 01/08/2020   Procedure: VIDEO BRONCHOSCOPY  WITH ENDOBRONCHIAL NAVIGATION;  Surgeon: Leslye Peer, MD;  Location: [facility] OR;  Service: Thoracic;  Laterality: N/A;    Family History  Problem Relation Age of Onset   Allergies Mother    Asthma Mother    Lung cancer Mother    Breast cancer Mother 27       recurrence age 52   Colon cancer Father 67   Colon polyps Father 45   Prostate cancer Brother 76   Breast cancer Sister 72       Recurrence age 79 BRCA negative   Colon polyps Sister    Leukemia Sister    Breast cancer Sister 18   Prostate cancer Brother 16   Breast cancer Maternal Grandmother 40   Colon cancer Maternal Aunt    Leukemia Maternal Grandfather    Lung cancer Maternal Aunt    Breast cancer Cousin    Esophageal cancer Neg Hx    Rectal cancer Neg Hx    Stomach cancer Neg Hx       Social History   Socioeconomic History   Marital status: Single    Spouse name: Not on file   Number of children: 0   Years of education: Not on file   Highest education level:  Not on file  Occupational History   Occupation: OFFICE MANAGER    Employer: [employer]  Tobacco Use   Smoking status: Former    Packs/day: 2.00    Years: 18.00    Additional pack years: 0.00    Total pack years: 36.00    Types: Cigarettes    Quit date: 07/12/1987    Years since quitting: 35.4   Smokeless tobacco: Never  Vaping Use   Vaping Use: Never used  Substance and Sexual Activity   Alcohol use: Yes    Comment: occasionally   Drug use: No   Sexual activity: Yes  Other Topics Concern   Not on file  Social History Narrative   Not on file   Social Determinants of Health   Financial Resource Strain: Low Risk  (05/26/2021)   Overall Financial Resource Strain (CARDIA)    Difficulty of Paying Living Expenses: Not hard at all  Food Insecurity: No Food Insecurity (05/26/2021)   Hunger Vital Sign    Worried About Running Out of Food in the Last Year: Never true    Ran Out of Food in the Last Year: Never true  Transportation Needs: No Transportation Needs (05/26/2021)   PRAPARE - Administrator, Civil Service (Medical): No    Lack of Transportation (Non-Medical): No  Physical Activity: Insufficiently Active (05/26/2021)   Exercise Vital Sign    Days of Exercise per Week: 3 days    Minutes of Exercise per Session: 20 min  Stress: No Stress Concern Present (05/26/2021)   [employer] of Occupational Health - Occupational Stress Questionnaire    Feeling of Stress : Not at all  Social Connections: Moderately Isolated (05/26/2021)   Social Connection and Isolation Panel [NHANES]    Frequency of Communication with Friends and Family: More than three times a week    Frequency of Social Gatherings with Friends and Family: More than three times a week    Attends Religious Services: More than 4 times per year    Active Member of [employer] or Organizations: No    Attends Banker Meetings: Never    Marital Status: Widowed    Allergies  Allergen  Reactions   Rifampin Nausea And Vomiting  Taxol [Paclitaxel] Anaphylaxis   Paclitaxel (Protein-Bound) Hives   Tape Itching   Abraxane [Paclitaxel Protein-Bound Part] Rash   Clarithromycin Rash     Current Outpatient Medications:    acyclovir (ZOVIRAX) 400 MG tablet, TAKE 1 TABLET BY MOUTH TWICE DAILY, Disp: 120 tablet, Rfl: 5   albuterol (VENTOLIN HFA) 108 (90 Base) MCG/ACT inhaler, INHALE 2 PUFFS INTO LUNGS EVERY 6 HOURS AS NEEDED FOR WHEEZING OR SHORTNESS OF BREATH., Disp: 8.5 g, Rfl: PRN   azithromycin (ZITHROMAX) 250 MG tablet, Take 1 tablet (250 mg total) by mouth daily., Disp: 30 tablet, Rfl: 11   Calcium Carbonate-Vit D-Min (CALCIUM 1200 PO), Take by mouth daily., Disp: , Rfl:    Cholecalciferol (D3 2000 PO), Take by mouth., Disp: , Rfl:    clofazimine 50 mg CAPS capsule (for compassionate use), Take 2 capsules (100 mg total) by mouth daily with breakfast., Disp: 100 capsule, Rfl: 1   ethambutol (MYAMBUTOL) 400 MG tablet, Take 2 tablets (800 mg total) by mouth daily. 800 mg, Disp: 60 tablet, Rfl: 11   letrozole (FEMARA) 2.5 MG tablet, Take 1 tablet (2.5 mg total) by mouth daily., Disp: 90 tablet, Rfl: 2   promethazine (PHENERGAN) 12.5 MG tablet, Take 1 tablet (12.5 mg total) by mouth every 6 (six) hours as needed for nausea or vomiting., Disp: 30 tablet, Rfl: 11   rosuvastatin (CRESTOR) 10 MG tablet, Take 1 tablet (10 mg total) by mouth daily., Disp: 30 tablet, Rfl: 11   vitamin C (ASCORBIC ACID) 500 MG tablet, Take 500 mg by mouth daily., Disp: , Rfl:     Review of Systems  Constitutional:  Negative for chills and fever.  HENT:  Negative for congestion and sore throat.   Eyes:  Negative for photophobia.  Respiratory:  Positive for shortness of breath. Negative for cough and wheezing.   Cardiovascular:  Negative for chest pain, palpitations and leg swelling.  Gastrointestinal:  Negative for abdominal pain, blood in stool, constipation, diarrhea, nausea and vomiting.   Genitourinary:  Negative for dysuria, flank pain and hematuria.  Musculoskeletal:  Negative for back pain and myalgias.  Skin:  Negative for rash.  Neurological:  Negative for dizziness, weakness and headaches.  Hematological:  Does not bruise/bleed easily.  Psychiatric/Behavioral:  Negative for suicidal ideas.        Objective:   Physical Exam Constitutional:      General: She is not in acute distress.    Appearance: Normal appearance. She is well-developed. She is not ill-appearing or diaphoretic.  HENT:     Head: Normocephalic and atraumatic.     Right Ear: Hearing and external ear normal.     Left Ear: Hearing and external ear normal.     Nose: No nasal deformity or rhinorrhea.  Eyes:     General: No scleral icterus.    Conjunctiva/sclera: Conjunctivae normal.     Right eye: Right conjunctiva is not injected.     Left eye: Left conjunctiva is not injected.     Pupils: Pupils are equal, round, and reactive to light.  Neck:     Vascular: No JVD.  Cardiovascular:     Rate and Rhythm: Normal rate and regular rhythm.     Heart sounds: Normal heart sounds, S1 normal and S2 normal. No murmur heard.    No friction rub. No gallop.  Pulmonary:     Effort: Pulmonary effort is normal. No respiratory distress.     Breath sounds: No stridor. Examination of the right-lower field reveals decreased  breath sounds. Decreased breath sounds present. No wheezing or rhonchi.  Abdominal:     General: Bowel sounds are normal. There is no distension.     Palpations: Abdomen is soft.     Tenderness: There is no abdominal tenderness.  Musculoskeletal:        General: Normal range of motion.     Right shoulder: Normal.     Left shoulder: Normal.     Cervical back: Normal range of motion and neck supple.     Right hip: Normal.     Left hip: Normal.     Right knee: Normal.     Left knee: Normal.  Lymphadenopathy:     Head:     Right side of head: No submandibular, preauricular or posterior  auricular adenopathy.     Left side of head: No submandibular, preauricular or posterior auricular adenopathy.     Cervical: No cervical adenopathy.     Right cervical: No superficial or deep cervical adenopathy.    Left cervical: No superficial or deep cervical adenopathy.  Skin:    General: Skin is warm and dry.     Coloration: Skin is not pale.     Findings: No abrasion, bruising, ecchymosis, erythema, lesion or rash.     Nails: There is no clubbing.  Neurological:     General: No focal deficit present.     Mental Status: She is alert and oriented to person, place, and time.     Sensory: No sensory deficit.     Coordination: Coordination normal.     Gait: Gait normal.  Psychiatric:        Attention and Perception: She is attentive.        Mood and Affect: Mood normal.        Speech: Speech normal.        Behavior: Behavior normal. Behavior is cooperative.        Thought Content: Thought content normal.        Judgment: Judgment normal.           Assessment & Plan:   M avium infection:  Will continue clofazimine and azithromycin lower dose along with ethambutol  I will recheck CBC and CMP  Transaminitis she had some mild elevation in her transaminases last visit we will recheck labs today.  Right lower lobe necrosis: There is concerned that she will not build to regain function here and that she might benefit from a pneumonectomy.  She is very reluctant to undergo this.   I have reviewed her most recent CT scan on 09/30/2022

## 2022-12-26 NOTE — Telephone Encounter (Signed)
Patient picked up 2 bottles of clofazimine on 12/26/22. Supply should last for ~3 months. Next refill due around the middle/end of September.  Suraya Vidrine L. Tavian Callander, PharmD, BCIDP, AAHIVP, CPP Clinical Pharmacist Practitioner Infectious Diseases Clinical Pharmacist [facility] 12/26/2022, 10:49 AM

## 2022-12-27 LAB — CBC WITH DIFFERENTIAL/PLATELET
Absolute Monocytes: 423 cells/uL (ref 200–950)
Basophils Absolute: 9 cells/uL (ref 0–200)
Basophils Relative: 0.2 %
Eosinophils Absolute: 42 cells/uL (ref 15–500)
Eosinophils Relative: 0.9 %
HCT: 33.5 % — ABNORMAL LOW (ref 35.0–45.0)
Hemoglobin: 10.9 g/dL — ABNORMAL LOW (ref 11.7–15.5)
Lymphs Abs: 573 cells/uL — ABNORMAL LOW (ref 850–3900)
MCH: 29.5 pg (ref 27.0–33.0)
MCHC: 32.5 g/dL (ref 32.0–36.0)
MCV: 90.5 fL (ref 80.0–100.0)
MPV: 10.2 fL (ref 7.5–12.5)
Monocytes Relative: 9 %
Neutro Abs: 3652 cells/uL (ref 1500–7800)
Neutrophils Relative %: 77.7 %
Platelets: 292 10*3/uL (ref 140–400)
RBC: 3.7 10*6/uL — ABNORMAL LOW (ref 3.80–5.10)
RDW: 14.3 % (ref 11.0–15.0)
Total Lymphocyte: 12.2 %
WBC: 4.7 10*3/uL (ref 3.8–10.8)

## 2022-12-27 LAB — COMPLETE METABOLIC PANEL WITH GFR
AG Ratio: 1.3 (calc) (ref 1.0–2.5)
ALT: 13 U/L (ref 6–29)
AST: 18 U/L (ref 10–35)
Albumin: 3.8 g/dL (ref 3.6–5.1)
CO2: 28 mmol/L (ref 20–32)
Creat: 0.63 mg/dL (ref 0.60–1.00)
Globulin: 2.9 g/dL (calc) (ref 1.9–3.7)
Glucose, Bld: 84 mg/dL (ref 65–99)
Potassium: 4.1 mmol/L (ref 3.5–5.3)
Sodium: 138 mmol/L (ref 135–146)
Total Bilirubin: 0.4 mg/dL (ref 0.2–1.2)

## 2022-12-28 NOTE — Telephone Encounter (Signed)
Patient checking on message for CT scan. Patient phone number is [phone].

## 2022-12-28 NOTE — Telephone Encounter (Addendum)
Pt was seen in April and RB has in note to schedule CT in June.  Open CT order was place on 02/08/22 and states to schedule in July.  Went ahead and scheduled CT at [facility] - first available 7/5 at 9:00.  Left vm for pt to call me for appt info.

## 2022-12-28 NOTE — Telephone Encounter (Signed)
Spoke to pt & gave her appt info.  Nothing further needed. 

## 2022-12-30 ENCOUNTER — Ambulatory Visit (INDEPENDENT_AMBULATORY_CARE_PROVIDER_SITE_OTHER): Payer: Medicare Other

## 2022-12-30 VITALS — BP 110/68 | HR 100 | Ht 66.0 in | Wt 103.0 lb

## 2022-12-30 DIAGNOSIS — M8080XS Other osteoporosis with current pathological fracture, unspecified site, sequela: Secondary | ICD-10-CM

## 2022-12-30 DIAGNOSIS — M810 Age-related osteoporosis without current pathological fracture: Secondary | ICD-10-CM

## 2022-12-30 MED ORDER — DENOSUMAB 60 MG/ML ~~LOC~~ SOSY
60.0000 mg | PREFILLED_SYRINGE | Freq: Once | SUBCUTANEOUS | Status: AC
Start: 2022-12-30 — End: 2022-12-30
  Administered 2022-12-30: 60 mg via SUBCUTANEOUS

## 2022-12-30 NOTE — Progress Notes (Signed)
After obtaining consent, and per orders of Dr. Lonzo Cloud, injection of Prolia  given by Bartow Zylstra L Rondell Frick in right arm SQ. Patient instructed to remain in clinic for 20 minutes afterwards, and to report any adverse reaction to me immediately.

## 2023-01-10 NOTE — Telephone Encounter (Signed)
Last Prolia inj 12/30/22 Next Prolia inj due 07/02/23 

## 2023-01-13 ENCOUNTER — Ambulatory Visit (HOSPITAL_COMMUNITY)
Admission: RE | Admit: 2023-01-13 | Discharge: 2023-01-13 | Disposition: A | Discharge status: Home / Self Care | Payer: Medicare Other | Source: Ambulatory Visit | Attending: Emergency Medicine | Admitting: Emergency Medicine

## 2023-01-13 DIAGNOSIS — R911 Solitary pulmonary nodule: Secondary | ICD-10-CM | POA: Diagnosis not present

## 2023-01-13 DIAGNOSIS — J479 Bronchiectasis, uncomplicated: Secondary | ICD-10-CM | POA: Diagnosis not present

## 2023-01-13 DIAGNOSIS — I3139 Other pericardial effusion (noninflammatory): Secondary | ICD-10-CM | POA: Diagnosis not present

## 2023-01-17 ENCOUNTER — Encounter: Payer: Self-pay | Admitting: Emergency Medicine

## 2023-01-18 ENCOUNTER — Ambulatory Visit (INDEPENDENT_AMBULATORY_CARE_PROVIDER_SITE_OTHER): Payer: Medicare Other | Admitting: Emergency Medicine

## 2023-01-18 ENCOUNTER — Encounter: Payer: Self-pay | Admitting: Emergency Medicine

## 2023-01-18 VITALS — BP 122/68 | HR 98 | Temp 98.2°F | Ht 66.0 in | Wt 103.2 lb

## 2023-01-18 DIAGNOSIS — R0609 Other forms of dyspnea: Secondary | ICD-10-CM | POA: Diagnosis not present

## 2023-01-18 DIAGNOSIS — R9389 Abnormal findings on diagnostic imaging of other specified body structures: Secondary | ICD-10-CM

## 2023-01-18 DIAGNOSIS — A310 Pulmonary mycobacterial infection: Secondary | ICD-10-CM

## 2023-01-18 NOTE — Progress Notes (Unsigned)
Subjective:    Patient ID: [name], female    DOB: 1951-03-06, 72 y.o.   MRN: [id number]  HPI  ROV 10/12/22 --Ms. Dills is 72 with history of breast cancer, right lower lobe non-small cell lung cancer with right lower lobe lobectomy.  She has subsequent significant parenchymal distortion, interstitial disease and bronchiectasis due to Mycobacterium avium.  Of note she also had thrombus in her right lower lobe pulmonary artery stump and did 3 months of anticoagulation. She came off her azithromycin, ethambutol in October 2023 but began to develop constitutional symptoms and increased cough.  The azithromycin and ethambutol were subsequently restarted.  She has had some chronic nausea associated with this.  Cough has improved some.  No hemoptysis.  Night sweats are better as well.  Some concern that she may be experiencing some hearing side effects, following with audiology.  She follows with Dr. Orvan Falconer and they are considering restarting clofazimine.  We repeated her CT chest 09/30/2022 as below. She still has significant exertional SOB with any activity. OK when she is sitting. The dyspnea has not improved like the other sx have.   CT scan of the chest 09/30/2022 reviewed by me, shows her known right basilar bronchiectasis and distortion with some compensatory hypertrophy of the left lung, new parabronchial vascular nodularity on the left consistent with inflammation or infection, new consolidation possible associated close the right lower lobe.  ROV 01/18/23 --72 year old woman with complicated right parenchymal lung disease due to Mycobacterium avium, right lower lobe non-small cell lung cancer and lobectomy.  She also had a thrombus in the right lower lobe pulmonary artery postsurgically for which she did 3 months of anticoagulation.  She is back on azithromycin, clofazimine and ethambutol, follows with ID. She is not having any cough or sputum production currently.  Tolerating her antibiotic  regimen.  Her biggest concern is progressive exertional dyspnea and decreased functional capacity associated with that.  She has not seen any desaturations less than 90% even with exertion.  CT chest 01/13/2023 reviewed by me, shows minimal residual functional right lung principally in the right upper lobe, otherwise stable changes on the right.  Subpleural anterior left upper lobe density and some peripheral tree-in-bud nodularity in the left lower lobe suggestive of Mycobacterium avium, lingular bronchiectasis   Review of Systems  Constitutional:  Positive for activity change and fatigue. Negative for fever and unexpected weight change.  HENT: Negative.  Negative for congestion, dental problem, ear pain, nosebleeds, postnasal drip, rhinorrhea, sinus pressure, sneezing, sore throat and trouble swallowing.   Eyes: Negative.  Negative for redness and itching.  Respiratory: Negative.  Negative for cough, chest tightness, shortness of breath and wheezing.   Cardiovascular: Negative.  Negative for palpitations and leg swelling.  Gastrointestinal: Negative.  Negative for nausea and vomiting.  Genitourinary: Negative.  Negative for dysuria.  Musculoskeletal:  Positive for back pain. Negative for joint swelling.  Skin: Negative.  Negative for rash.  Neurological: Negative.  Negative for headaches.  Hematological: Negative.  Does not bruise/bleed easily.  Psychiatric/Behavioral: Negative.  Negative for dysphoric mood. The patient is not nervous/anxious.         Objective:   Physical Exam Vitals:   01/18/23 1427  BP: 122/68  Pulse: 98  Temp: 98.2 F (36.8 C)  TempSrc: Oral  SpO2: 98%  Weight: 103 lb 3.2 oz (46.8 kg)  Height: 5\' 6"  (1.676 m)   Gen: stronger, thin woman, well-nourished, in no distress  ENT: No lesions,  mouth  clear,  oropharynx clear, no postnasal drip  Neck: No JVD, no stridor  Lungs: No use of accessory muscles, coarse R lung with some inspiratory and expiratory  rhonchi. L is clear  Cardiovascular: RRR, heart sounds normal, no murmur or gallops, no peripheral edema  Musculoskeletal: No deformities, no cyanosis or clubbing  Neuro: alert, non focal  Skin: Warm, no lesions or rash     Assessment & Plan:  Abnormal CT of the chest Her CT chest from 01/13/2023 is overall stable.  There is some focal nodular inflammatory change that persists in the left lung, unchanged in size, possibly slightly less associated groundglass opacity compared with her prior.  She will continue to need surveillance CT scans both to ensure that she is not experiencing parenchymal destruction from [facility] on the left.  Also need to follow the nodules to ensure suspicion does not increase that these could represent malignancy given her history of non-small cell lung cancer and breast cancer.  Next scan to be done in January 2025.  Mycobacterium avium infection (HCC) Little residual functional lung on the right.  Suspect that right-sided cavitary lesion is a reservoir for her disease, has made eradication impossible.  She follows with ID, question whether she will need to be on suppressive antibiotic regimen lifelong.  I have discussed with her the possibility of a right pneumonectomy to but this would be a difficult surgery to tolerate.  Would probably would not consider this unless we feel it would decrease the risk that her left lung would experience similar parenchymal damage  Dyspnea on exertion Suspect that this is mainly due to restrictive physiology from loss of right lung function.  Consider also superimposed obstruction.  She has albuterol but has not tried using it.  I encouraged her to do so.  If she feels that it gives her benefit then we could consider trying long-acting bronchodilator.  She has not seen any desaturations.  Will consider repeat walking oximetry next time.  Also could revisit possible pulmonary rehab.    Levy Pupa, MD, PhD 01/19/2023, 8:15 AM [facility] [phone] or if no answer [phone]

## 2023-01-18 NOTE — Patient Instructions (Signed)
Continue your antibiotics and follow-up with ID as planned We reviewed your CT scan of the chest today. We will plan to repeat your CT chest without contrast in January 2025. Keep your albuterol available to use 2 puffs if needed for shortness of breath, chest tightness, wheezing.  Keep track of whether this medication helps her breathing.  If so we may try starting a scheduled maintenance inhaler medication to see if you get benefit. We will fill out a handicap parking form for you today. Follow with Dr Delton Coombes in 6 months or sooner if you have any problems

## 2023-01-19 DIAGNOSIS — R0609 Other forms of dyspnea: Secondary | ICD-10-CM | POA: Insufficient documentation

## 2023-01-19 NOTE — Assessment & Plan Note (Signed)
Little residual functional lung on the right.  Suspect that right-sided cavitary lesion is a reservoir for her disease, has made eradication impossible.  She follows with ID, question whether she will need to be on suppressive antibiotic regimen lifelong.  I have discussed with her the possibility of a right pneumonectomy to but this would be a difficult surgery to tolerate.  Would probably would not consider this unless we feel it would decrease the risk that her left lung would experience similar parenchymal damage

## 2023-01-19 NOTE — Assessment & Plan Note (Addendum)
Her CT chest from 01/13/2023 is overall stable.  There is some focal nodular inflammatory change that persists in the left lung, unchanged in size, possibly slightly less associated groundglass opacity compared with her prior.  She will continue to need surveillance CT scans both to ensure that she is not experiencing parenchymal destruction from [facility] on the left.  Also need to follow the nodules to ensure suspicion does not increase that these could represent malignancy given her history of non-small cell lung cancer and breast cancer.  Next scan to be done in January 2025.

## 2023-01-19 NOTE — Assessment & Plan Note (Signed)
Suspect that this is mainly due to restrictive physiology from loss of right lung function.  Consider also superimposed obstruction.  She has albuterol but has not tried using it.  I encouraged her to do so.  If she feels that it gives her benefit then we could consider trying long-acting bronchodilator.  She has not seen any desaturations.  Will consider repeat walking oximetry next time.  Also could revisit possible pulmonary rehab.

## 2023-02-22 DIAGNOSIS — L570 Actinic keratosis: Secondary | ICD-10-CM | POA: Diagnosis not present

## 2023-02-22 DIAGNOSIS — D485 Neoplasm of uncertain behavior of skin: Secondary | ICD-10-CM | POA: Diagnosis not present

## 2023-02-24 ENCOUNTER — Ambulatory Visit: Payer: Medicare Other | Admitting: Internal Medicine

## 2023-02-24 ENCOUNTER — Encounter: Payer: Self-pay | Admitting: Internal Medicine

## 2023-02-24 VITALS — BP 110/68 | HR 92 | Ht 66.0 in | Wt 104.0 lb

## 2023-02-24 DIAGNOSIS — M8080XS Other osteoporosis with current pathological fracture, unspecified site, sequela: Secondary | ICD-10-CM

## 2023-02-24 DIAGNOSIS — M810 Age-related osteoporosis without current pathological fracture: Secondary | ICD-10-CM | POA: Diagnosis not present

## 2023-02-24 NOTE — Progress Notes (Signed)
Name: Anna Valentine  MRN/ DOB: [id number], 07/20/1950    Age/ Sex: 72 y.o., female    PCP: Margaree Mackintosh, MD   Reason for Endocrinology Evaluation: Osteoporosis      Date of Initial Endocrinology Evaluation: 08/25/2021    HPI: [name] is a 72 y.o. female with a past medical history of dyslipidemia, bronchiectasis , Hx of PE and lung ca( in 2006) . Hx of Breast Ca . The patient presented for initial endocrinology clinic visit on 08/25/2021 for consultative assistance with her Osteoporosis .   Pt was diagnosed with osteoporosis: 2013 with a left total hip T-score of -2.5 . Repeat in 07/2021 - 4.0   Menarche at age : 67 Menopausal at age : 21 Fracture Hx: right finger fracture while tripping  Hx of HRT: no FH of osteoporosis or hip fracture: no Prior Hx of anti-estrogenic therapy : yes Prior Hx of anti-resorptive therapy : alendronate around 2010 . Had to be stopped due to  GERD.  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 is here for follow-up on osteoporosis.   Last Prolia injection 12/21/2022  Patient follows with [facility] for nontuberculous mycobacterial lung disease, she was started on antibiotics to include azithromycin and ethambutol 08/2022.  She also follows with infectious disease   She has occasional pain especially when she was off Abx  Denies fall Denies falls or recent fractures  Has occasional diarrhea , due to long term Azithromycin due to MAC Denies spasms/cramps  She has an episode of jaw locking last month, up to date on dental exams Limited with exercise  due to shortness of breath    Calcium 1200 mg daily Vitamin D3 3000 IU daily Prolia 60 mg SQ every 6 months    HISTORY:  Past Medical History:  Past Medical History:  Diagnosis Date   Anemia    hx  of IDA   BRCA negative 2011   BRCA I/ II negative   Breast cancer (HCC) 08/2009   LEFT=stage 2, rx with lumpectomy and xrt   Breast cancer (HCC) 2019   RIGHT   Bronchiectasis (HCC)    Chronic diastolic CHF (congestive heart failure) (HCC) 02/20/2020   Constipation    Essential hypertension 02/20/2020   Family history of adverse reaction to anesthesia    My Sister has nausea   Family history of breast cancer    Family history of colon cancer    Family history of lung cancer    GERD (gastroesophageal reflux disease)    not presently having symptoms   Hypertension    Iatrogenic pneumothorax 02/06/2019   Lung cancer (HCC) 06/06/2005   stage 1 poorly differentiated adenocarcinoma, s/p right lower lobectomy.   Lung cancer (HCC) 12/26/2022   Mycobacterium avium complex (HCC)    dx 2021   Osteopenia 09/13/2013   Osteoporosis    Personal history of lung cancer    Pneumonia    2007ish, walking pnemonia - 2009 ish   STD (sexually transmitted disease)    HSV   Transaminitis 12/26/2022   Vitamin D deficiency 09/13/2013   Past Surgical History:  Past Surgical History:  Procedure Laterality Date   BREAST BIOPSY     BREAST LUMPECTOMY  10/12/2009   Left lumpectomy and radiation,  stage II, ER/PR+, Her 2 nu negative   BUNIONECTOMY Bilateral    COLONOSCOPY  2018   SA-MAC-suprep-no polyps   COLONOSCOPY     LOBECTOMY Right 06/06/2005   [place]   MASTECTOMY W/ SENTINEL NODE BIOPSY Bilateral 03/15/2018   MASTECTOMY W/ SENTINEL NODE BIOPSY Bilateral 03/15/2018   Procedure: BILATERAL TOTAL MASTECTOMIES WITH RIGHT SENTINEL LYMPH NODE BIOPSY;  Surgeon: Emelia Loron, MD;  Location: [facility] OR;  Service: General;  Laterality: Bilateral;   PORTACATH PLACEMENT N/A 03/15/2018   Procedure: INSERTION PORT-A-CATH WITH [place];  Surgeon: Emelia Loron, MD;  Location: [facility] OR;  Service: General;  Laterality: N/A;   TONSILLECTOMY     TUBAL LIGATION  1984   VIDEO BRONCHOSCOPY  02/06/2019   Flexible video  fiberoptic bronchoscopy with electromagnetic navigation and biopsies.   VIDEO BRONCHOSCOPY WITH ENDOBRONCHIAL NAVIGATION Left 02/06/2019   Procedure: VIDEO BRONCHOSCOPY WITH ENDOBRONCHIAL NAVIGATION, left lung;  Surgeon: Leslye Peer, MD;  Location: [facility] OR;  Service: Thoracic;  Laterality: Left;   VIDEO BRONCHOSCOPY WITH ENDOBRONCHIAL NAVIGATION N/A 01/08/2020   Procedure: VIDEO BRONCHOSCOPY WITH ENDOBRONCHIAL NAVIGATION;  Surgeon: Leslye Peer, MD;  Location: [facility] OR;  Service: Thoracic;  Laterality: N/A;    Social History:  reports that she quit smoking about 35 years ago. Her smoking use included cigarettes. She started smoking about 53 years ago. She has a 36 pack-year smoking history. She has never used smokeless tobacco. She reports current alcohol use. She reports that she does not use drugs. Family History: family history includes Allergies in her mother; Asthma in her mother; Breast cancer in her cousin; Breast cancer (age of onset: 63) in her sister; Breast cancer (age of onset: 45) in her sister; Breast cancer (age of onset: 25) in her mother; Breast cancer (age of onset: 9) in her maternal grandmother; Colon cancer in her maternal aunt; Colon cancer (age of onset: 24) in her father; Colon polyps in her sister; Colon polyps (age of onset: 72) in her father; Leukemia in her maternal grandfather and sister; Lung cancer in her maternal aunt and mother; Prostate cancer (age of onset: 57) in her brother; Prostate cancer (age of onset: 40) in her brother.   HOME MEDICATIONS: Allergies as of 02/24/2023       Reactions   Rifampin Nausea And Vomiting   Taxol [paclitaxel] Anaphylaxis   Paclitaxel (protein-bound) Hives   Tape Itching   Abraxane [paclitaxel Protein-bound Part] Rash   Clarithromycin Rash        Medication List        Accurate as of February 24, 2023 10:34 AM. If you have any questions, ask your nurse or doctor.          acyclovir 400 MG tablet Commonly known as:  ZOVIRAX TAKE 1 TABLET BY MOUTH TWICE DAILY   albuterol 108 (90 Base) MCG/ACT inhaler Commonly known as: VENTOLIN HFA INHALE 2 PUFFS INTO LUNGS EVERY 6 HOURS AS NEEDED FOR WHEEZING OR SHORTNESS OF BREATH.   ascorbic acid 500 MG tablet Commonly known as: VITAMIN C Take 500 mg by mouth daily.   azithromycin 250 MG tablet Commonly known as: ZITHROMAX Take 1 tablet (250 mg total) by mouth daily.   CALCIUM 1200 PO Take by mouth daily.   clofazimine 50 mg Caps capsule (for compassionate use) Take 2 capsules (100 mg total) by mouth daily with breakfast.   D3 2000 PO Take by mouth.   ethambutol 400 MG tablet Commonly known as: MYAMBUTOL Take 2 tablets (800 mg total) by  mouth daily. 800 mg   letrozole 2.5 MG tablet Commonly known as: FEMARA Take 1 tablet (2.5 mg total) by mouth daily.   promethazine 12.5 MG tablet Commonly known as: PHENERGAN Take 1 tablet (12.5 mg total) by mouth every 6 (six) hours as needed for nausea or vomiting.   rosuvastatin 10 MG tablet Commonly known as: Crestor Take 1 tablet (10 mg total) by mouth daily.          REVIEW OF SYSTEMS: A comprehensive ROS was conducted with the patient and is negative except as per HPI     OBJECTIVE:  VS: BP 110/68 (BP Location: Left Arm, Patient Position: Sitting, Cuff Size: Small)   Pulse 92   Ht 5\' 6"  (1.676 m)   Wt 104 lb (47.2 kg)   SpO2 99%   BMI 16.79 kg/m    Wt Readings from Last 3 Encounters:  02/24/23 104 lb (47.2 kg)  01/18/23 103 lb 3.2 oz (46.8 kg)  12/30/22 103 lb (46.7 kg)     EXAM: General: Pt appears well and is in NAD  Neck: General: Supple without adenopathy. Thyroid: Thyroid size normal.  No goiter or nodules appreciated.   Lungs: Clear with good BS bilat   Heart: Auscultation: RRR.  Extremities:  BL LE: No pretibial edema   Mental Status: Judgment, insight: Intact Orientation: Oriented to time, place, and person Mood and affect: No depression, anxiety, or agitation      DATA REVIEWED:     Latest Reference Range & Units 12/26/22 10:52  Sodium 135 - 146 mmol/L 138  Potassium 3.5 - 5.3 mmol/L 4.1  Chloride 98 - 110 mmol/L 103  CO2 20 - 32 mmol/L 28  Glucose 65 - 99 mg/dL 84  BUN 7 - 25 mg/dL 15  Creatinine 6.96 - 2.95 mg/dL 2.84  Calcium 8.6 - 13.2 mg/dL 9.3  BUN/Creatinine Ratio 6 - 22 (calc) SEE NOTE:  eGFR > OR = 60 mL/min/1.70m2 95  AG Ratio 1.0 - 2.5 (calc) 1.3  AST 10 - 35 U/L 18  ALT 6 - 29 U/L 13  Total Protein 6.1 - 8.1 g/dL 6.7  Total Bilirubin 0.2 - 1.2 mg/dL 0.4    Latest Reference Range & Units 12/26/22 10:52  Alkaline phosphatase (APISO) 37 - 153 U/L 82     DXA 08/02/2021   ASSESSMENT: The BMD measured at DualFemur Total Left is 0.503 g/cm2 with a T-score of -4.0. This patient is considered osteoporotic according to World Health Organization [facility]) criteria.   The can quality is good.   Site Region Measured Date Measured Age YA BMD Significant CHANGE T-score DualFemur Total Left 08/02/2021    69.9         -4.0    0.503 g/cm2   DualFemur Total Mean 08/02/2021    69.9         -4.0    0.504 g/cm2   AP Spine  L1-L4      08/02/2021    69.9         -3.5    0.766 g/cm2   ASSESSMENT/PLAN/RECOMMENDATIONS:   Osteoporosis :  -Tolerating Prolia without any side effects -CMP and alk phos were normal, these were reviewed through an outside facility  -Emphasized the importance of calcium and vitamin D intake -Limited with exercise due to shortness of breath  Medications : Continue calcium 1200 mg daily Continue Vitamin D3 3000 IU daily Continue Prolia 60 mg SQ every 6 months  Follow-up 1 year   Signed electronically by:  Abby Raelyn Mora, MD  [facility] Endocrinology  [facility] [address] [place], [place] [address] Phone: [phone] FAX: [phone]   CC: Margaree Mackintosh, MD [address] [place] [address] Phone: [phone] Fax: [phone]   Return to  Endocrinology clinic as below: Future Appointments  Date Time Provider Department Center  02/27/2023  1:40 PM Laurey Morale, MD [facility] None  03/20/2023 10:45 AM Rankin, Alford Highland, MD RDE-RDE None  06/26/2023  9:00 AM M[facility]-LAB MJB-MJB MJB  06/27/2023 11:15 AM Randall Hiss, MD [facility]-RCID RCID  06/29/2023 11:00 AM Margaree Mackintosh, MD MJB-MJB MJB  07/17/2023 11:20 AM GI-315 CT 1 GI-315CT GI-[address] WE  11/02/2023 11:15 AM Causey, Larna Daughters, NP [facility] None

## 2023-02-27 ENCOUNTER — Ambulatory Visit (HOSPITAL_COMMUNITY)
Admission: RE | Admit: 2023-02-27 | Discharge: 2023-02-27 | Disposition: A | Discharge status: Home / Self Care | Payer: Medicare Other | Source: Ambulatory Visit | Attending: Cardiology | Admitting: Cardiology

## 2023-02-27 ENCOUNTER — Encounter (HOSPITAL_COMMUNITY): Payer: Self-pay | Admitting: Cardiology

## 2023-02-27 VITALS — BP 120/78 | HR 92 | Wt 103.0 lb

## 2023-02-27 DIAGNOSIS — R9431 Abnormal electrocardiogram [ECG] [EKG]: Secondary | ICD-10-CM | POA: Diagnosis not present

## 2023-02-27 DIAGNOSIS — R0609 Other forms of dyspnea: Secondary | ICD-10-CM | POA: Diagnosis not present

## 2023-02-27 DIAGNOSIS — Z87891 Personal history of nicotine dependence: Secondary | ICD-10-CM | POA: Insufficient documentation

## 2023-02-27 DIAGNOSIS — Z853 Personal history of malignant neoplasm of breast: Secondary | ICD-10-CM | POA: Diagnosis not present

## 2023-02-27 DIAGNOSIS — J449 Chronic obstructive pulmonary disease, unspecified: Secondary | ICD-10-CM | POA: Diagnosis not present

## 2023-02-27 DIAGNOSIS — T451X5S Adverse effect of antineoplastic and immunosuppressive drugs, sequela: Secondary | ICD-10-CM | POA: Diagnosis not present

## 2023-02-27 DIAGNOSIS — Z85118 Personal history of other malignant neoplasm of bronchus and lung: Secondary | ICD-10-CM | POA: Insufficient documentation

## 2023-02-27 DIAGNOSIS — I5032 Chronic diastolic (congestive) heart failure: Secondary | ICD-10-CM

## 2023-02-27 DIAGNOSIS — I272 Pulmonary hypertension, unspecified: Secondary | ICD-10-CM | POA: Diagnosis not present

## 2023-02-27 DIAGNOSIS — I2510 Atherosclerotic heart disease of native coronary artery without angina pectoris: Secondary | ICD-10-CM | POA: Diagnosis not present

## 2023-02-27 DIAGNOSIS — Z86711 Personal history of pulmonary embolism: Secondary | ICD-10-CM | POA: Diagnosis not present

## 2023-02-27 NOTE — Progress Notes (Signed)
ADVANCED HF CLINIC NOTE  Oncology: Dr. Darnelle Catalan Cardiology: Dr. Shirlee Latch  72 y.o. with history of COPD, prior lung cancer, and breast cancer was referred by Dr. Darnelle Catalan for cardio-oncology evaluation given use of Herceptin. Patient had breast cancer diagnosed on left in 2/11, ER+/PR+/HER2-.  She had left lumpectomy and radiation.  Diagnosed in 7/19 with right breast cancer, ER+/PR-/HER2+.  She had bilateral mastectomy.  Plan initially for paclitaxel x 12 cycles and Herceptin x 1 year.  Intolerant of paclitaxel, switched to abraxane.    She had atypical chest pain in 6/19, Cardiolite at that time showed no ischemia.  Other than that incident, no cardiac history.  She is a prior smoker but has quit.  No exertional dyspnea or chest pain.    Echo in 3/20 showed significantly less negative strain, and Herceptin was held.  There is no plan to restart Herceptin.  She was started on Coreg. Repeat echo in 4/20 showed EF down to 50-55%, losartan was added.  Repeat echo in 6/20 showed EF lower again at 45-50%.  Echo in 8/20 showed EF 55%.  CTA chest in 9/22 showed thrombus in the right PA system at the stump from prior right lower lobectomy.    Echo 1/23: Technically difficult study with EF 55-60%, poorly viewed RV.   1/23 AHF f/u she stopped Coreg and losartan due to low BP.  No exertional chest pain.  She has chronic dyspnea related to MAC infection.   Antibiotics for MAC were stopped in 1/24 and she became more short of breath gradually.  Given worsening exertional dyspnea, she was seen again here and had echo in 6/24.  Echo showed EF 55-60%, normal RV, PASP 19, IVC normal.  She restarted on antibiotics for MAC after this, and breathing returned to baseline.   Today she returns for  AHF follow up.  She remains on antibiotic regimen for MAC.  Her breathing is back to baseline, short of breath with stairs or walking longer distances.  She no longer goes to the grocery store or takes walks outside. No  chest pain. No lightheadedness.  No orthopnea/PND.   ECG (personally reviewed): NSR, nonspecific T wave flattening  Labs (6/20): K 4.3, creatinine 0.77 Labs (7/20): K 4, creatinine 0.64, LDL 105 Labs (3/24): K 4.1, creatinine 0.74 Labs (5/24): BNP 19 Labs (6/24): K 4.4, creatinine 0.63  PMH: 1. Bronchiectasis: MAC noted.  2. Lung cancer: Adenocarcinoma.  In 11/06, had RUL wedge resection, right middle lobe wedge resection, and right lower lobectomy.  3. Breast cancer: Diagnosed on left in 2/11, ER+/PR+/HER2-.  She had left lumpectomy and radiation.  Diagnosed in 7/19 with right breast cancer, ER+/PR-/HER2+.  She had bilateral mastectomy.  Plan initially for paclitaxel x 12 cycles and Herceptin x 1 year.  Intolerant of paclitaxel, switched to abraxane.   - Echo (12/19): EF 55-60%, GLS -19%, normal RV size and systolic function, PASP 48 mmHg - Echo (3/20): EF 55%, GLS -13.3%. Normal RV size and systolic function.  - Echo (4/20): EF 50-55%, grade 2 diastolic dysfunction, RV normal. - Echo (6/20): EF 45-50%, grade 2 diastolic dysfunction, normal RV, GLS -17.7%.  - Echo (8/20): EF 55%, GLS -16.1%, normal RV.  - Echo (1/23): Technically difficult study with EF 55-60%, poorly viewed RV.  - Echo (6/24): EF 55-60%, normal RV, PASP 19, IVC normal. 4. Chest pain: Cardiolite (6/19) with EF 75%, no ischemia.  5. CTA chest in 9/22 showed thrombus in the right PA system at the stump from  prior right lower lobectomy.   Social History   Socioeconomic History   Marital status: Single    Spouse name: Not on file   Number of children: 0   Years of education: Not on file   Highest education level: Not on file  Occupational History   Occupation: OFFICE MANAGER    Employer: [employer]  Tobacco Use   Smoking status: Former    Current packs/day: 0.00    Average packs/day: 2.0 packs/day for 18.0 years (36.0 ttl pk-yrs)    Types: Cigarettes    Start date: 07/11/1969    Quit date: 07/12/1987    Years  since quitting: 35.6   Smokeless tobacco: Never  Vaping Use   Vaping status: Never Used  Substance and Sexual Activity   Alcohol use: Yes    Comment: occasionally   Drug use: No   Sexual activity: Yes  Other Topics Concern   Not on file  Social History Narrative   Not on file   Social Determinants of Health   Financial Resource Strain: Low Risk  (05/26/2021)   Overall Financial Resource Strain (CARDIA)    Difficulty of Paying Living Expenses: Not hard at all  Food Insecurity: No Food Insecurity (08/29/2022)   [facility]   Hunger Vital Sign    Worried About Running Out of Food in the Last Year: Never true    Ran Out of Food in the Last Year: Never true  Transportation Needs: No Transportation Needs (05/26/2021)   PRAPARE - Administrator, Civil Service (Medical): No    Lack of Transportation (Non-Medical): No  Physical Activity: Insufficiently Active (05/26/2021)   Exercise Vital Sign    Days of Exercise per Week: 3 days    Minutes of Exercise per Session: 20 min  Stress: No Stress Concern Present (05/26/2021)   [employer] of Occupational Health - Occupational Stress Questionnaire    Feeling of Stress : Not at all  Social Connections: Moderately Isolated (05/26/2021)   Social Connection and Isolation Panel [NHANES]    Frequency of Communication with Friends and Family: More than three times a week    Frequency of Social Gatherings with Friends and Family: More than three times a week    Attends Religious Services: More than 4 times per year    Active Member of [employer] or Organizations: No    Attends Banker Meetings: Never    Marital Status: Widowed  Intimate Partner Violence: Not At Risk (05/26/2021)   Humiliation, Afraid, Rape, and Kick questionnaire    Fear of Current or Ex-Partner: No    Emotionally Abused: No    Physically Abused: No    Sexually Abused: No   Family History   Problem Relation Age of Onset   Allergies Mother    Asthma Mother    Lung cancer Mother    Breast cancer Mother 77       recurrence age 57   Colon cancer Father 74   Colon polyps Father 60   Prostate cancer Brother 47   Breast cancer Sister 8       Recurrence age 15 BRCA negative   Colon polyps Sister    Leukemia Sister    Breast cancer Sister 66   Prostate cancer Brother 68   Breast cancer Maternal Grandmother 45   Colon cancer Maternal Aunt    Leukemia Maternal Grandfather    Lung cancer Maternal Aunt    Breast  cancer Cousin    Esophageal cancer Neg Hx    Rectal cancer Neg Hx    Stomach cancer Neg Hx    ROS: All systems reviewed and negative except as per HPI.   Current Outpatient Medications  Medication Sig Dispense Refill   acyclovir (ZOVIRAX) 400 MG tablet TAKE 1 TABLET BY MOUTH TWICE DAILY 120 tablet 5   albuterol (VENTOLIN HFA) 108 (90 Base) MCG/ACT inhaler INHALE 2 PUFFS INTO LUNGS EVERY 6 HOURS AS NEEDED FOR WHEEZING OR SHORTNESS OF BREATH. 8.5 g PRN   azithromycin (ZITHROMAX) 250 MG tablet Take 1 tablet (250 mg total) by mouth daily. 30 tablet 11   Calcium Carbonate-Vit D-Min (CALCIUM 1200 PO) Take by mouth daily.     Cholecalciferol (D3 2000 PO) Take by mouth.     clofazimine 50 mg CAPS capsule (for compassionate use) Take 2 capsules (100 mg total) by mouth daily with breakfast. 100 capsule 1   ethambutol (MYAMBUTOL) 400 MG tablet Take 2 tablets (800 mg total) by mouth daily. 800 mg 60 tablet 11   letrozole (FEMARA) 2.5 MG tablet Take 1 tablet (2.5 mg total) by mouth daily. 90 tablet 2   promethazine (PHENERGAN) 12.5 MG tablet Take 1 tablet (12.5 mg total) by mouth every 6 (six) hours as needed for nausea or vomiting. 30 tablet 11   rosuvastatin (CRESTOR) 10 MG tablet Take 1 tablet (10 mg total) by mouth daily. 30 tablet 11   vitamin C (ASCORBIC ACID) 500 MG tablet Take 500 mg by mouth daily.     No current facility-administered medications for this encounter.    BP 120/78   Pulse 92   Wt 46.7 kg (103 lb)   SpO2 100%   BMI 16.62 kg/m   Physical exam:  General: NAD Neck: No JVD, no thyromegaly or thyroid nodule.  Lungs: Clear to auscultation bilaterally with normal respiratory effort. CV: Nondisplaced PMI.  Heart regular S1/S2, no S3/S4, no murmur.  No peripheral edema.  No carotid bruit.  Normal pedal pulses.  Abdomen: Soft, nontender, no hepatosplenomegaly, no distention.  Skin: Intact without lesions or rashes.  Neurologic: Alert and oriented x 3.  Psych: Normal affect. Extremities: No clubbing or cyanosis.  HEENT: Normal.   Assessment/Plan: 1. Exertional dyspnea: Think this is due primarily due to lung disease.  She has minimal residual lung on the right.  There is also a cavitary lesion that can serve as a reservoir for MAI.  Dyspnea was worse when off MAI antibiotic regimen, suspect disease flared.  Now back to baseline on antibiotic regimen.  Echo in 6/24 was unremarkable, EF 55-60%, normal RV, PASP 19, IVC normal.  BNP was normal.  2. Breast cancer: Concern in 3/20 for cardiotoxicity from Herceptin, it was stopped.  Per Dr. Darnelle Catalan, did not rechallenge.  Echo 1/23 EF 55-60% though study was difficult.  Echo in 6/24 with EF 55-60%, normal RV, PASP 19, IVC normal. - She is off Coreg and losartan with hypotension and can stay off them.  3. Pulmonary hypertension: By a prior echo, PASP 48 mmHg.  She has a history of bronchiectasis and also of extensive right lung resections for lung cancer.  Suspect group 3 pulmonary hypertension from lung disease.  Most recent echo in 6/24 showed normal RV and PA systolic pressure estimate 19 mmHg.   4. CAD: Significant coronary calcification noted on CTs.   - Continue Crestor 10 mg daily.   5. Pulmonary embolus: Thrombus noted at site of stump in PA system from  prior right lower lobectomy.  - Now off Eliquis.   Follow up prn  Marca Ancona  02/27/2023 Advanced Heart Failure Clinic [facility] [address] Heart and Vascular [place] [address] [phone] (office) [phone] (fax)

## 2023-02-27 NOTE — Patient Instructions (Addendum)
CONGRATULATIONS YOU HAVE GRADUATED FROM THE HEART FAILURE CLINIC   There has been no changes to your medications   Your physician recommends that you schedule a follow-up appointment in: as needed   If you have any questions or concerns before your next appointment please send [place] a message through [place] or call our office at [phone].    TO LEAVE A MESSAGE FOR THE NURSE SELECT OPTION 2, PLEASE LEAVE A MESSAGE INCLUDING: YOUR NAME DATE OF BIRTH CALL BACK NUMBER REASON FOR CALL**this is important as we prioritize the call backs  YOU WILL RECEIVE A CALL BACK THE SAME DAY AS LONG AS YOU CALL BEFORE 4:00 PM  At the Advanced Heart Failure Clinic, you and your health needs are our priority. As part of our continuing mission to provide you with exceptional heart care, we have created designated Provider Care Teams. These Care Teams include your primary Cardiologist (physician) and Advanced Practice Providers (APPs- Physician Assistants and Nurse Practitioners) who all work together to provide you with the care you need, when you need it.   You may see any of the following providers on your designated Care Team at your next follow up: Dr Arvilla Meres Dr Marca Ancona Dr. Marcos Eke, NP Robbie Lis, [facility] [place], [place] Brynda Peon, NP Karle Plumber, PharmD   Please be sure to bring in all your medications bottles to every appointment.    Thank you for choosing [facility]

## 2023-03-16 ENCOUNTER — Other Ambulatory Visit: Payer: Self-pay | Admitting: Internal Medicine

## 2023-03-20 ENCOUNTER — Encounter (INDEPENDENT_AMBULATORY_CARE_PROVIDER_SITE_OTHER): Payer: Medicare Other | Admitting: Ophthalmology

## 2023-04-05 ENCOUNTER — Telehealth: Payer: Self-pay | Admitting: Pharmacist

## 2023-04-05 NOTE — Telephone Encounter (Signed)
2 bottles of clofazimine are located in the pharmacy office when patient needs a refill.  Koleen Celia L. Eythan Jayne, PharmD, BCIDP, AAHIVP, CPP Clinical Pharmacist Practitioner Infectious Diseases Clinical Pharmacist [facility] 04/05/2023, 3:37 PM

## 2023-04-12 ENCOUNTER — Encounter: Payer: Self-pay | Admitting: Infectious Disease

## 2023-04-13 ENCOUNTER — Telehealth: Payer: Self-pay | Admitting: Pharmacist

## 2023-04-13 NOTE — Telephone Encounter (Signed)
Patient picked up 2 bottles of clofazimine on 04/13/23. Supply should last for ~3 months. Next refill due around the end of the year/beginning of 2025.  Saadiq Poche L. Deyani Hegarty, PharmD, BCIDP, AAHIVP, CPP Clinical Pharmacist Practitioner Infectious Diseases Clinical Pharmacist [facility] 04/13/2023, 4:06 PM

## 2023-04-13 NOTE — Telephone Encounter (Signed)
Medication was picked up at [facility] on 04/13/23.

## 2023-05-03 DIAGNOSIS — Z23 Encounter for immunization: Secondary | ICD-10-CM | POA: Diagnosis not present

## 2023-05-31 NOTE — Telephone Encounter (Signed)
Prolia VOB initiated via [email or web address]  Next Prolia inj DUE: 07/02/23

## 2023-06-01 ENCOUNTER — Other Ambulatory Visit: Payer: Self-pay | Admitting: Internal Medicine

## 2023-06-26 ENCOUNTER — Other Ambulatory Visit: Payer: Medicare Other

## 2023-06-26 ENCOUNTER — Encounter: Payer: Self-pay | Admitting: Emergency Medicine

## 2023-06-26 DIAGNOSIS — Z902 Acquired absence of lung [part of]: Secondary | ICD-10-CM | POA: Insufficient documentation

## 2023-06-26 DIAGNOSIS — Z1329 Encounter for screening for other suspected endocrine disorder: Secondary | ICD-10-CM | POA: Diagnosis not present

## 2023-06-26 DIAGNOSIS — E7800 Pure hypercholesterolemia, unspecified: Secondary | ICD-10-CM

## 2023-06-26 DIAGNOSIS — Z0000 Encounter for general adult medical examination without abnormal findings: Secondary | ICD-10-CM | POA: Diagnosis not present

## 2023-06-26 DIAGNOSIS — M810 Age-related osteoporosis without current pathological fracture: Secondary | ICD-10-CM | POA: Diagnosis not present

## 2023-06-26 HISTORY — DX: Acquired absence of lung (part of): Z90.2

## 2023-06-26 NOTE — Telephone Encounter (Signed)
Prior Auth NOT required for [employer]

## 2023-06-26 NOTE — Telephone Encounter (Signed)
Pt ready for scheduling on or after 07/02/23  Out-of-pocket cost due at time of visit: $0  Primary: Medicare [place] co-insurance: 20% (approximately $320.99) Admin fee co-insurance: 20% (approximately $25)  Deductible: $240 of $240 met  Prior Auth NOT required  Secondary: Aetna Medicare Supplement [place] co-insurance: Covers Medicare Part B co-insurance Admin fee co-insurance: Covers Medicare Part B co-insurance  Deductible:  Covered by secondary  Prior Auth: NOT required PA# Valid:   ** This summary of benefits is an estimation of the patient's out-of-pocket cost. Exact cost may vary based on individual plan coverage.

## 2023-06-26 NOTE — Progress Notes (Signed)
Chief complaint: Follow-up for mycobacterial infection, she is now experiencing some worsening vision which she is not sure is due to cataracts versus concerns that it could be due to ethambutol.  She is also had chills at night at times Subjective:    Patient ID: Anna Valentine, female    DOB: [birth year], 72 y.o.   MRN: [id number]  HPI  Anna Valentine is a 72 year old woman with chronic M avium pulmonary infection, previously followed by my partner Dr. Cliffton Asters who has retired this April of 2024  Dr. Modena Nunnery recounts her history below:  "She has a remote history of lung cancer requiring right lower lobectomy and bronchiectasis. She was diagnosed with Mycobacterium avium pneumonia and started on azithromycin, ethambutol and rifampin in 2021.  She was intolerant of rifampin with severe GI distress and was briefly hospitalized.  She improved with stopping antibiotic therapy at that time.     She started on a salvage regimen of azithromycin, ethambutol and clofazimine in January 2022.  She had significant symptomatic improvement.  A CT scan this past July showed stable abnormalities with no progression of her pulmonary findings.  However, she remained culture positive and started to have hearing loss bilaterally, presumed related to azithromycin.  She also started having worsening diarrhea.  I had her stop taking her antibiotics on 04/21/2022.  Her diarrhea resolved promptly.     In January she noted gradually worsening DOE, then developed a cough that was mostly dry but occasionally productive. She did not have any documented fevers but felt cold and was having mild night sweats. Her appetite worsened and she started to lose weight gradually.   She met with Dr. Zackery Barefoot at [facility] on 08/30/2022.  He recommended that she restart azithromycin at a lower dose of 250 mg daily and to also restart ethambutol for her chronic, smoldering Mycobacterium avium pneumonia that has  recently flared up again.   He recommended holding off on restarting clofazimine for now since it did not help her become culture negative previously.  He did say that if she has a suboptimal response to azithromycin and ethambutol to consider adding aerosolized amikacin.     She feels like she is tolerating her antibiotics well.  After her last visit here she decided to restart clofazimine on her own.  She no longer notes the muffled feeling with her hearing and has not experienced any further hearing loss that she is aware of.  I did receive a note from her audiologist who noted that there have been "slight improvement in air conduction thresholds" shortly before restarting antibiotics.  Her cough has resolved and she is unable to produce any sputum for repeat culture.  She has not had any further fever or night sweats.  She remains fatigued but says that her fatigue is slightly better.  Her nausea has improved and she is not requiring as much Phenergan.  She did not feel like Zofran was as effective.  She says that she is eating okay but is still losing weight.  She is not taking any supplements.  Her biggest concern is worsening dyspnea on exertion.  She says that she is very dejected.  A friend had to go with her to the grocery store because she is unable to shop for groceries without sitting down.  She monitors her oxygen saturation and says that the lowest it goes is about 93% but she notes that her heart rate jumps up to around 115/min  when walking.  Several years ago she tried pulmonary rehab but had to quit after she developed a spinal compression fracture.   A repeat chest CT scan on 09/30/2022 revealed:   IMPRESSION: 1. New consolidation and possible associated necrosis in the right lower lobe, indicative of pneumonia. 2. New peribronchovascular nodularity and ground-glass in the left lower lobe, likely due to endobronchial spread of infection. 3. Extensive cylindrical bronchiectasis  bilaterally. 4. Aortic atherosclerosis (ICD10-I70.0). Coronary artery calcification."   I saw her in June of 2024. At that time she was continuing her low-dose azithromycin and clofazimine and ethambutol.  Since I saw her last she had repeat CT scan in July 2024 showing    IMPRESSION: 1. Chronic volume loss in the right lung with varicoid bronchiectasis and a large irregular right upper lobe bulla. There is only a very small amount of functional lung on the right, primarily in the right upper lobe, similar to previous. 2. Chronic scarring and nodularity along with bronchiectasis and airway plugging in the lingula. 3. Peripheral tree-in-bud nodularity in the left lower lobe compatible with atypical infectious bronchiolitis, pattern similar to previous. 4. Stable small pericardial effusion. 5. Coronary, aortic arch, and branch vessel atherosclerotic vascular disease. 6. Remote compression fractures at T4, T6, and T7 unchanged from previous. Mild chronic superior endplate compression fracture at T12. 7. Bilateral mastectomy and prior right axillary dissection.  She is apprehensive about a surgical intervention for this right lung.  She is had stable weight and cough is been fairly stable she does not really have much of a cough most of the time except when she sometimes has paroxysms that improve with chewing gum and drinking water she has had chills at nights and at one point in time some chattering of her teeth but these have been intermittent and not accompanied by other symptoms.  She has had worsening visual acuity that she thinks could be due to cataracts but she is worried about ethambutol she is scheduled to see optometry in late February but is seen Dr. Luciana Axe with ophthalmology I have suggested she connect with Dr. Luciana Axe to see if he can see her or fast-track her being evaluated for this.  If we need to get rid of the ethambutol I would vote for instituting inhaled  amikacin.     Past Medical History:  Diagnosis Date   Anemia    hx of IDA   BRCA negative 2011   BRCA I/ II negative   Breast cancer (HCC) 08/2009   LEFT=stage 2, rx with lumpectomy and xrt   Breast cancer (HCC) 2019   RIGHT   Bronchiectasis (HCC)    Chronic diastolic CHF (congestive heart failure) (HCC) 02/20/2020   Constipation    Essential hypertension 02/20/2020   Family history of adverse reaction to anesthesia    My Sister has nausea   Family history of breast cancer    Family history of colon cancer    Family history of lung cancer    GERD (gastroesophageal reflux disease)    not presently having symptoms   Hypertension    Iatrogenic pneumothorax 02/06/2019   Lung cancer (HCC) 06/06/2005   stage 1 poorly differentiated adenocarcinoma, s/p right lower lobectomy.   Lung cancer (HCC) 12/26/2022   Mycobacterium avium complex (HCC)    dx 2021   Osteopenia 09/13/2013   Osteoporosis    Personal history of lung cancer    Pneumonia    2007ish, walking pnemonia - 2009 ish   STD (sexually transmitted  disease)    HSV   Transaminitis 12/26/2022   Vitamin D deficiency 09/13/2013    Past Surgical History:  Procedure Laterality Date   BREAST BIOPSY     BREAST LUMPECTOMY  10/12/2009   Left lumpectomy and radiation, stage II, ER/PR+, Her 2 nu negative   BUNIONECTOMY Bilateral    COLONOSCOPY  2018   SA-MAC-suprep-no polyps   COLONOSCOPY     LOBECTOMY Right 06/06/2005   [place]   MASTECTOMY W/ SENTINEL NODE BIOPSY Bilateral 03/15/2018   MASTECTOMY W/ SENTINEL NODE BIOPSY Bilateral 03/15/2018   Procedure: BILATERAL TOTAL MASTECTOMIES WITH RIGHT SENTINEL LYMPH NODE BIOPSY;  Surgeon: Emelia Loron, MD;  Location: [facility] OR;  Service: General;  Laterality: Bilateral;   PORTACATH PLACEMENT N/A 03/15/2018   Procedure: INSERTION PORT-A-CATH WITH [place];  Surgeon: Emelia Loron, MD;  Location: [facility] OR;  Service: General;  Laterality: N/A;   TONSILLECTOMY     TUBAL LIGATION  1984    VIDEO BRONCHOSCOPY  02/06/2019   Flexible video fiberoptic bronchoscopy with electromagnetic navigation and biopsies.   VIDEO BRONCHOSCOPY WITH ENDOBRONCHIAL NAVIGATION Left 02/06/2019   Procedure: VIDEO BRONCHOSCOPY WITH ENDOBRONCHIAL NAVIGATION, left lung;  Surgeon: Leslye Peer, MD;  Location: [facility] OR;  Service: Thoracic;  Laterality: Left;   VIDEO BRONCHOSCOPY WITH ENDOBRONCHIAL NAVIGATION N/A 01/08/2020   Procedure: VIDEO BRONCHOSCOPY WITH ENDOBRONCHIAL NAVIGATION;  Surgeon: Leslye Peer, MD;  Location: [facility] OR;  Service: Thoracic;  Laterality: N/A;    Family History  Problem Relation Age of Onset   Allergies Mother    Asthma Mother    Lung cancer Mother    Breast cancer Mother 24       recurrence age 54   Colon cancer Father 60   Colon polyps Father 58   Prostate cancer Brother 36   Breast cancer Sister 37       Recurrence age 45 BRCA negative   Colon polyps Sister    Leukemia Sister    Breast cancer Sister 18   Prostate cancer Brother 9   Breast cancer Maternal Grandmother 70   Colon cancer Maternal Aunt    Leukemia Maternal Grandfather    Lung cancer Maternal Aunt    Breast cancer Cousin    Esophageal cancer Neg Hx    Rectal cancer Neg Hx    Stomach cancer Neg Hx       Social History   Socioeconomic History   Marital status: Single    Spouse name: Not on file   Number of children: 0   Years of education: Not on file   Highest education level: Bachelor's degree (e.g., BA, AB, BS)  Occupational History   Occupation: OFFICE MANAGER    Employer: [employer]  Tobacco Use   Smoking status: Former    Current packs/day: 0.00    Average packs/day: 2.0 packs/day for 18.0 years (36.0 ttl pk-yrs)    Types: Cigarettes    Start date: 07/11/1969    Quit date: 07/12/1987    Years since quitting: 35.9   Smokeless tobacco: Never  Vaping Use   Vaping status: Never Used  Substance and Sexual Activity   Alcohol use: Yes    Comment: occasionally   Drug use: No   Sexual  activity: Yes  Other Topics Concern   Not on file  Social History Narrative   Not on file   Social Drivers of Health   Financial Resource Strain: Low Risk  (06/26/2023)   Overall Financial Resource Strain (CARDIA)    Difficulty of  Paying Living Expenses: Not hard at all  Food Insecurity: No Food Insecurity (06/26/2023)   Hunger Vital Sign    Worried About Running Out of Food in the Last Year: Never true    Ran Out of Food in the Last Year: Never true  Transportation Needs: No Transportation Needs (06/26/2023)   PRAPARE - Administrator, Civil Service (Medical): No    Lack of Transportation (Non-Medical): No  Physical Activity: Unknown (06/26/2023)   Exercise Vital Sign    Days of Exercise per Week: 0 days    Minutes of Exercise per Session: Not on file  Stress: No Stress Concern Present (06/26/2023)   [employer] of Occupational Health - Occupational Stress Questionnaire    Feeling of Stress : Not at all  Social Connections: Moderately Integrated (06/26/2023)   Social Connection and Isolation Panel [NHANES]    Frequency of Communication with Friends and Family: More than three times a week    Frequency of Social Gatherings with Friends and Family: More than three times a week    Attends Religious Services: More than 4 times per year    Active Member of [employer] or Organizations: Yes    Attends Banker Meetings: Not on file    Marital Status: Widowed    Allergies  Allergen Reactions   Rifampin Nausea And Vomiting   Taxol [Paclitaxel] Anaphylaxis   Paclitaxel (Protein-Bound) Hives   Tape Itching   Abraxane [Paclitaxel Protein-Bound Part] Rash   Clarithromycin Rash     Current Outpatient Medications:    acyclovir (ZOVIRAX) 400 MG tablet, TAKE 1 TABLET TWICE DAILY, Disp: 180 tablet, Rfl: 3   albuterol (VENTOLIN HFA) 108 (90 Base) MCG/ACT inhaler, INHALE 2 PUFFS INTO LUNGS EVERY 6 HOURS AS NEEDED FOR WHEEZING/SHORTNESS OF BREATH, Disp: 8.5 each,  Rfl: 12   azithromycin (ZITHROMAX) 250 MG tablet, Take 1 tablet (250 mg total) by mouth daily., Disp: 30 tablet, Rfl: 11   Calcium Carbonate-Vit D-Min (CALCIUM 1200 PO), Take by mouth daily., Disp: , Rfl:    Cholecalciferol (D3 2000 PO), Take by mouth., Disp: , Rfl:    clofazimine 50 mg CAPS capsule (for compassionate use), Take 2 capsules (100 mg total) by mouth daily with breakfast., Disp: 100 capsule, Rfl: 1   ethambutol (MYAMBUTOL) 400 MG tablet, Take 2 tablets (800 mg total) by mouth daily. 800 mg, Disp: 60 tablet, Rfl: 11   letrozole (FEMARA) 2.5 MG tablet, Take 1 tablet (2.5 mg total) by mouth daily., Disp: 90 tablet, Rfl: 2   promethazine (PHENERGAN) 12.5 MG tablet, Take 1 tablet (12.5 mg total) by mouth every 6 (six) hours as needed for nausea or vomiting., Disp: 30 tablet, Rfl: 11   rosuvastatin (CRESTOR) 10 MG tablet, Take 1 tablet (10 mg total) by mouth daily., Disp: 30 tablet, Rfl: 11   vitamin C (ASCORBIC ACID) 500 MG tablet, Take 500 mg by mouth daily., Disp: , Rfl:     Review of Systems  Constitutional:  Negative for activity change, appetite change, chills, diaphoresis, fatigue, fever and unexpected weight change.  HENT:  Negative for congestion, rhinorrhea, sinus pressure, sneezing, sore throat and trouble swallowing.   Eyes:  Positive for visual disturbance. Negative for photophobia.  Respiratory:  Positive for cough and shortness of breath. Negative for chest tightness, wheezing and stridor.   Cardiovascular:  Negative for chest pain, palpitations and leg swelling.  Gastrointestinal:  Negative for abdominal distention, abdominal pain, anal bleeding, blood in stool, constipation, diarrhea, nausea and vomiting.  Genitourinary:  Negative for difficulty urinating, dysuria, flank pain and hematuria.  Musculoskeletal:  Negative for arthralgias, back pain, gait problem, joint swelling and myalgias.  Skin:  Negative for color change, pallor, rash and wound.  Neurological:  Negative  for dizziness, tremors, weakness and light-headedness.  Hematological:  Negative for adenopathy. Does not bruise/bleed easily.  Psychiatric/Behavioral:  Negative for agitation, behavioral problems, confusion, decreased concentration, dysphoric mood and sleep disturbance.        Objective:   Physical Exam Constitutional:      General: She is not in acute distress.    Appearance: She is not diaphoretic.  HENT:     Head: Normocephalic and atraumatic.     Right Ear: External ear normal.     Left Ear: External ear normal.     Nose: Nose normal.     Mouth/Throat:     Pharynx: No oropharyngeal exudate.  Eyes:     General: No scleral icterus.       Right eye: No discharge.        Left eye: No discharge.     Extraocular Movements: Extraocular movements intact.     Conjunctiva/sclera: Conjunctivae normal.  Cardiovascular:     Rate and Rhythm: Normal rate and regular rhythm.     Heart sounds: No murmur heard.    No friction rub. No gallop.  Pulmonary:     Effort: Prolonged expiration present. No respiratory distress.     Breath sounds: Stridor present. No wheezing or rales.  Abdominal:     General: There is no distension.     Palpations: Abdomen is soft.  Musculoskeletal:        General: No tenderness. Normal range of motion.     Cervical back: Normal range of motion and neck supple.  Lymphadenopathy:     Cervical: No cervical adenopathy.  Skin:    General: Skin is warm and dry.     Coloration: Skin is not jaundiced or pale.     Findings: No erythema, lesion or rash.  Neurological:     General: No focal deficit present.     Mental Status: She is alert and oriented to person, place, and time.     Coordination: Coordination normal.  Psychiatric:        Mood and Affect: Mood normal.        Behavior: Behavior normal.        Thought Content: Thought content normal.        Judgment: Judgment normal.           Assessment & Plan:    M avium infection:  Continue  clofazimine that she picked up from the clinic today along with lower dose azithromycin and ethambutol for now.  If ethambutol is causing ocular toxicity would institute inhaled amikacin  Decreased visual acuity: She needs to be seen by ophthalmology to evaluate if this is due to her cataracts versus a drug effect from ethambutol I would favor the former.   Lower lobe necrosis: She is not eager to have surgery here.  Seen counseling recommended she get updated COVID-19 vaccine today and then in 6 months

## 2023-06-27 ENCOUNTER — Ambulatory Visit (INDEPENDENT_AMBULATORY_CARE_PROVIDER_SITE_OTHER): Payer: Medicare Other | Admitting: Infectious Disease

## 2023-06-27 ENCOUNTER — Other Ambulatory Visit: Payer: Self-pay

## 2023-06-27 ENCOUNTER — Telehealth: Payer: Self-pay | Admitting: Pharmacist

## 2023-06-27 ENCOUNTER — Encounter: Payer: Self-pay | Admitting: Infectious Disease

## 2023-06-27 VITALS — BP 123/80 | HR 102 | Temp 98.0°F | Ht 62.0 in | Wt 101.0 lb

## 2023-06-27 DIAGNOSIS — H269 Unspecified cataract: Secondary | ICD-10-CM | POA: Diagnosis not present

## 2023-06-27 DIAGNOSIS — Z23 Encounter for immunization: Secondary | ICD-10-CM | POA: Diagnosis not present

## 2023-06-27 DIAGNOSIS — J479 Bronchiectasis, uncomplicated: Secondary | ICD-10-CM

## 2023-06-27 DIAGNOSIS — Z902 Acquired absence of lung [part of]: Secondary | ICD-10-CM | POA: Diagnosis not present

## 2023-06-27 DIAGNOSIS — H547 Unspecified visual loss: Secondary | ICD-10-CM

## 2023-06-27 DIAGNOSIS — R9389 Abnormal findings on diagnostic imaging of other specified body structures: Secondary | ICD-10-CM

## 2023-06-27 DIAGNOSIS — A310 Pulmonary mycobacterial infection: Secondary | ICD-10-CM

## 2023-06-27 DIAGNOSIS — Z7185 Encounter for immunization safety counseling: Secondary | ICD-10-CM

## 2023-06-27 LAB — CBC WITH DIFFERENTIAL/PLATELET
Absolute Lymphocytes: 495 {cells}/uL — ABNORMAL LOW (ref 850–3900)
Absolute Monocytes: 495 {cells}/uL (ref 200–950)
Basophils Absolute: 18 {cells}/uL (ref 0–200)
Basophils Relative: 0.4 %
Eosinophils Absolute: 41 {cells}/uL (ref 15–500)
Eosinophils Relative: 0.9 %
HCT: 37.1 % (ref 35.0–45.0)
Hemoglobin: 12 g/dL (ref 11.7–15.5)
MCH: 29.6 pg (ref 27.0–33.0)
MCHC: 32.3 g/dL (ref 32.0–36.0)
MCV: 91.6 fL (ref 80.0–100.0)
MPV: 10.1 fL (ref 7.5–12.5)
Monocytes Relative: 11 %
Neutro Abs: 3452 {cells}/uL (ref 1500–7800)
Neutrophils Relative %: 76.7 %
Platelets: 340 10*3/uL (ref 140–400)
RBC: 4.05 10*6/uL (ref 3.80–5.10)
RDW: 12.6 % (ref 11.0–15.0)
Total Lymphocyte: 11 %
WBC: 4.5 10*3/uL (ref 3.8–10.8)

## 2023-06-27 LAB — COMPLETE METABOLIC PANEL WITH GFR
AG Ratio: 1.4 (calc) (ref 1.0–2.5)
ALT: 8 U/L (ref 6–29)
AST: 14 U/L (ref 10–35)
Albumin: 4.2 g/dL (ref 3.6–5.1)
Alkaline phosphatase (APISO): 91 U/L (ref 37–153)
BUN: 13 mg/dL (ref 7–25)
CO2: 26 mmol/L (ref 20–32)
Calcium: 10 mg/dL (ref 8.6–10.4)
Chloride: 101 mmol/L (ref 98–110)
Creat: 0.69 mg/dL (ref 0.60–1.00)
Globulin: 3.1 g/dL (ref 1.9–3.7)
Glucose, Bld: 88 mg/dL (ref 65–99)
Potassium: 5.1 mmol/L (ref 3.5–5.3)
Sodium: 138 mmol/L (ref 135–146)
Total Bilirubin: 0.5 mg/dL (ref 0.2–1.2)
Total Protein: 7.3 g/dL (ref 6.1–8.1)
eGFR: 93 mL/min/{1.73_m2} (ref >=60)

## 2023-06-27 LAB — LIPID PANEL
Cholesterol: 202 mg/dL — ABNORMAL HIGH (ref <200)
HDL: 65 mg/dL (ref >=50)
LDL Cholesterol (Calc): 120 mg/dL — ABNORMAL HIGH
Non-HDL Cholesterol (Calc): 137 mg/dL — ABNORMAL HIGH (ref <130)
Total CHOL/HDL Ratio: 3.1 (calc) (ref <5.0)
Triglycerides: 75 mg/dL (ref <150)

## 2023-06-27 LAB — TSH: TSH: 2.85 m[IU]/L (ref 0.40–4.50)

## 2023-06-27 NOTE — Telephone Encounter (Signed)
Patient picked up 2 bottles of clofazimine on 06/27/23. Supply should last for ~3 months. Next refill due around the middle/end of March.  Carah Barrientes L. Jannette Fogo, PharmD, BCIDP, AAHIVP, CPP Clinical Pharmacist Practitioner Infectious Diseases Clinical Pharmacist [facility] 06/27/2023, 11:17 AM

## 2023-06-27 NOTE — Progress Notes (Signed)
Annual Wellness Visit    Patient Care Team: Antwane Grose, Luanna Cole, MD as PCP - General (Internal Medicine) Magrinat, Valentino Hue, MD (Inactive) as Consulting Physician (Oncology) Leslye Peer, MD as Consulting Physician (Pulmonary Disease) Denyse Dago, MD as Referring Physician Armbruster, Willaim Rayas, MD as Consulting Physician (Gastroenterology) Ria Comment, FNP (Family Medicine) Antony Blackbird, MD as Consulting Physician (Radiation Oncology) Emelia Loron, MD as Consulting Physician (General Surgery) Laurey Morale, MD as Consulting Physician (Cardiology) Bensimhon, Bevelyn Buckles, MD as Consulting Physician (Cardiology)  Visit Date: 06/29/23   Chief Complaint  Patient presents with   Medicare Wellness    Subjective:   Patient: Anna Valentine, Female    DOB: March 18, 1951, 72 y.o.   MRN: [id number]  Anna Valentine is a 72 y.o. Female who presents today for her Annual Wellness Visit.  History of pulmonary hypertension and followed by cardiology.    History of Mycobacterium AVM infection followed by infectious disease clinic.  She is also followed by Dr. Delton Coombes, pulmonologist.      Remote history of breast cancer.  History of non-small cell lung cancer in 2006.  Pathology showed 1.2 cm poorly differentiated adenocarcinoma.  Was seen at [facility] and also by Dr. Darnelle Catalan.  She does tolerate azithromycin.  In 2020 Dr. Delton Coombes discovered bronchiectasis and infection with Mycobacterium avium colonized with Pseudomonas treated with multiple antibiotics.  She subsequently developed a thick wall cavitary lesion in the posterior right upper lobe July 2021.  PET scan showed hypermetabolism in the cavitary lesion as well as the right lung parenchyma.  She was started on 3 drug therapy for Mycobacterium avium intracellulare including azithromycin, rifampin and ethambutol which led to hyponatremia and volume depletion due to nausea.   In February 2011 was diagnosed with breast cancer and  underwent left lumpectomy with diagnosis of lobular carcinoma.  Sentinel node biopsy showed 1 out of 4 lymph nodes positive for invasive lobular carcinoma and she was clinically staged 2.  Tumor was ER positive PR positive HER2/neu negative.  Underwent radiation therapy which was completed in July 2011.  She subsequently underwent total bilateral mastectomies with right sentinel lymph node biopsy September 2019.  She had right breast cancer triple positive at that time.  Genetic testing revealed positive p53 gene mutation.     History of iron deficiency and has received iron infusions.  Her appetite has been affected by chronic illnesses but she is eating better now.    History of compression deformity involving T7 vertebral body with 60% anterior height loss and has been referred to endocrinology regarding osteoporosis treatment.  Bone density study January 2023 showed T-score of -3.5 in the AP spine and -4.0 in the left femur. Now seeing Dr. Lonzo Cloud for regarding osteoporosis.  Had thoracic compression fracture in 2022 after lifting heavy suitcase.   She has a history of open angle glaucoma with borderline findings and hypertensive retinopathy seen by Dr. Elmer Picker.  Also has puckering of macula right eye.   Now seeing Dr. Pamelia Hoit for follow-up on triple positive breast cancer with homozygous p53 mutation.  Currently on anastrozole.  Generally seen yearly.  She has aged out of cervical cancer screenings, however her last non-gynecological cytology Mammogram  Colonoscopy    Bone DEXA   Past Medical History:  Diagnosis Date   Anemia    hx of IDA   BRCA negative 2011   BRCA I/ II negative   Breast cancer (HCC) 08/2009   LEFT=stage 2, rx with lumpectomy  and xrt   Breast cancer (HCC) 2019   RIGHT   Bronchiectasis (HCC)    Chronic diastolic CHF (congestive heart failure) (HCC) 02/20/2020   Constipation    Essential hypertension 02/20/2020   Family history of adverse reaction to  anesthesia    My Sister has nausea   Family history of breast cancer    Family history of colon cancer    Family history of lung cancer    GERD (gastroesophageal reflux disease)    not presently having symptoms   Hypertension    Iatrogenic pneumothorax 02/06/2019   Lung cancer (HCC) 06/06/2005   stage 1 poorly differentiated adenocarcinoma, s/p right lower lobectomy.   Lung cancer (HCC) 12/26/2022   Mycobacterium avium complex (HCC)    dx 2021   Osteopenia 09/13/2013   Osteoporosis    Personal history of lung cancer    Pneumonia    2007ish, walking pnemonia - 2009 ish   S/P lobectomy of lung 06/26/2023   STD (sexually transmitted disease)    HSV   Transaminitis 12/26/2022   Vitamin D deficiency 09/13/2013     Family History  Problem Relation Age of Onset   Allergies Mother    Asthma Mother    Lung cancer Mother    Breast cancer Mother 73       recurrence age 30   Colon cancer Father 70   Colon polyps Father 47   Prostate cancer Brother 99   Breast cancer Sister 42       Recurrence age 1 BRCA negative   Colon polyps Sister    Leukemia Sister    Breast cancer Sister 69   Prostate cancer Brother 92   Breast cancer Maternal Grandmother 84   Colon cancer Maternal Aunt    Leukemia Maternal Grandfather    Lung cancer Maternal Aunt    Breast cancer Cousin    Esophageal cancer Neg Hx    Rectal cancer Neg Hx    Stomach cancer Neg Hx      Social Hx widow resides alone    ROS fatigue   Objective:   Vitals: BP 120/80   Pulse (!) 112   Ht 5\' 2"  (1.575 m)   Wt 100 lb (45.4 kg)   SpO2 98%   BMI 18.29 kg/m   Physical Exam Chest clear Cor RRR. No LE edema   Most recent functional status assessment:    06/29/2023   11:03 AM  In your present state of health, do you have any difficulty performing the following activities:  Hearing? 0  Vision? 0  Difficulty concentrating or making decisions? 0  Walking or climbing stairs? 0  Dressing or bathing? 0  Doing  errands, shopping? 0  Preparing Food and eating ? N  Using the Toilet? N  In the past six months, have you accidently leaked urine? N  Do you have problems with loss of bowel control? N  Managing your Medications? N  Managing your Finances? N  Housekeeping or managing your Housekeeping? N   Most recent fall risk assessment:    06/29/2023   11:04 AM  Fall Risk   Falls in the past year? 0  Number falls in past yr: 0  Injury with Fall? 0  Risk for fall due to : No Fall Risks  Follow up Education provided;Falls prevention discussed;Falls evaluation completed    Most recent depression screenings:    06/27/2023   11:08 AM 10/26/2022   11:23 AM  PHQ 2/9 Scores  PHQ -  2 Score 0 0   Most recent cognitive screening:    06/29/2023   11:05 AM  6CIT Screen  What Year? 0 points  What month? 0 points  What time? 0 points  Count back from 20 0 points  Months in reverse 0 points  Repeat phrase 0 points  Total Score 0 points     Results:   Studies obtained and personally reviewed by me:  Imaging, colonoscopy, mammogram, bone density scan, echocardiogram, heart cath, stress test, CT calcium score, etc. I, Margaree Mackintosh, MD, have reviewed all documentation for this visit. The documentation on 07/09/23 for the exam, diagnosis, procedures, and orders are all accurate and complete.   Labs:       Component Value Date/Time   NA 138 06/26/2023 0912   NA 139 12/22/2016 1141   K 5.1 06/26/2023 0912   K 4.2 12/22/2016 1141   CL 101 06/26/2023 0912   CL 103 11/22/2012 1025   CO2 26 06/26/2023 0912   CO2 28 12/22/2016 1141   GLUCOSE 88 06/26/2023 0912   GLUCOSE 92 12/22/2016 1141   GLUCOSE 94 11/22/2012 1025   BUN 13 06/26/2023 0912   BUN 13.2 12/22/2016 1141   CREATININE 0.69 06/26/2023 0912   CREATININE 0.7 12/22/2016 1141   CALCIUM 10.0 06/26/2023 0912   CALCIUM 9.7 12/22/2016 1141   PROT 7.3 06/26/2023 0912   PROT 7.0 12/22/2016 1141   ALBUMIN 3.7 10/11/2021 1130    ALBUMIN 3.8 12/22/2016 1141   AST 14 06/26/2023 0912   AST 34 09/22/2020 1309   AST 18 12/22/2016 1141   ALT 8 06/26/2023 0912   ALT 82 (H) 09/22/2020 1309   ALT 10 12/22/2016 1141   ALKPHOS 89 10/11/2021 1130   ALKPHOS 81 12/22/2016 1141   BILITOT 0.5 06/26/2023 0912   BILITOT 0.3 09/22/2020 1309   BILITOT 0.65 12/22/2016 1141   GFRNONAA >60 12/02/2022 1215   GFRNONAA >60 09/22/2020 1309   GFRNONAA 93 04/16/2020 1225   GFRAA 108 04/16/2020 1225     Lab Results  Component Value Date   WBC 4.5 06/26/2023   HGB 12.0 06/26/2023   HCT 37.1 06/26/2023   MCV 91.6 06/26/2023   PLT 340 06/26/2023    Lab Results  Component Value Date   CHOL 202 (H) 06/26/2023   HDL 65 06/26/2023   LDLCALC 120 (H) 06/26/2023   TRIG 75 06/26/2023   CHOLHDL 3.1 06/26/2023    No results found for: "HGBA1C"   Lab Results  Component Value Date   TSH 2.85 06/26/2023     Assessment & Plan:   Medicare wellness visit      Annual wellness visit done today including the all of the following: Reviewed patient's Family Medical History Reviewed and updated list of patient's medical providers Assessment of cognitive impairment was done Assessed patient's functional ability Established a written schedule for health screening services Health Risk Assessent Completed and Reviewed  Discussed health benefits of physical activity, and encouraged her to engage in regular exercise appropriate for her age and condition.        I,Emily Lagle,acting as a Neurosurgeon for Margaree Mackintosh, MD.,have documented all relevant documentation on the behalf of Margaree Mackintosh, MD,as directed by  Margaree Mackintosh, MD while in the presence of Margaree Mackintosh, MD.

## 2023-06-29 ENCOUNTER — Encounter: Payer: Self-pay | Admitting: Internal Medicine

## 2023-06-29 ENCOUNTER — Ambulatory Visit: Payer: Medicare Other | Admitting: Internal Medicine

## 2023-06-29 VITALS — BP 120/80 | HR 112 | Ht 62.0 in | Wt 100.0 lb

## 2023-06-29 DIAGNOSIS — E7800 Pure hypercholesterolemia, unspecified: Secondary | ICD-10-CM

## 2023-06-29 DIAGNOSIS — Z0000 Encounter for general adult medical examination without abnormal findings: Secondary | ICD-10-CM | POA: Diagnosis not present

## 2023-06-29 DIAGNOSIS — Z8619 Personal history of other infectious and parasitic diseases: Secondary | ICD-10-CM

## 2023-06-29 DIAGNOSIS — M810 Age-related osteoporosis without current pathological fracture: Secondary | ICD-10-CM | POA: Diagnosis not present

## 2023-06-29 DIAGNOSIS — Z853 Personal history of malignant neoplasm of breast: Secondary | ICD-10-CM

## 2023-06-29 DIAGNOSIS — Z85118 Personal history of other malignant neoplasm of bronchus and lung: Secondary | ICD-10-CM

## 2023-06-29 LAB — POCT URINALYSIS DIP (CLINITEK)
Bilirubin, UA: NEGATIVE
Blood, UA: NEGATIVE
Glucose, UA: NEGATIVE mg/dL
Ketones, POC UA: NEGATIVE mg/dL
Leukocytes, UA: NEGATIVE
Nitrite, UA: NEGATIVE
Spec Grav, UA: 1.01 (ref 1.010–1.025)
Urobilinogen, UA: 0.2 U/dL
pH, UA: 6.5 (ref 5.0–8.0)

## 2023-06-29 NOTE — Addendum Note (Signed)
Addended by: Gregery Na on: 06/29/2023 11:31 AM   Modules accepted: Orders

## 2023-06-29 NOTE — Progress Notes (Addendum)
Patient Care Team: Margaree Mackintosh, MD as PCP - General (Internal Medicine) Magrinat, Valentino Hue, MD (Inactive) as Consulting Physician (Oncology) Leslye Peer, MD as Consulting Physician (Pulmonary Disease) Denyse Dago, MD as Referring Physician Armbruster, Willaim Rayas, MD as Consulting Physician (Gastroenterology) Ria Comment, FNP (Family Medicine) Antony Blackbird, MD as Consulting Physician (Radiation Oncology) Emelia Loron, MD as Consulting Physician (General Surgery) Laurey Morale, MD as Consulting Physician (Cardiology) Bensimhon, Bevelyn Buckles, MD as Consulting Physician (Cardiology)  Visit Date: 06/29/23  Subjective:    Patient ID: Anna Valentine , Female   DOB: [birth year], 72 y.o.    MRN: [id number]   72 y.o. Female presents today for an office visit.   She is having difficulty with shortness of breath. Followed by Dr. Daiva Eves at infectious disease for M. avium infection, lower lobe necrosis. She is on clofazimine, ethambutol, chronic antibiotics. Denies chest pain. Planning to have surgery to address lower lobe necrosis. She is unable to exercise as she would like and this is impacting her mental health. 01/13/23 CT chest showed: 1) Chronic volume loss in the right lung with varicoid bronchiectasis and a large irregular right upper lobe bulla. There is only a very small amount of functional lung on the right, primarily in the right upper lobe, similar to previous. 2) Chronic scarring and nodularity along with bronchiectasis and airway plugging in the lingula. 3) Peripheral tree-in-bud nodularity in the left lower lobe compatible with atypical infectious bronchiolitis, pattern similar to previous. 4) Stable small pericardial effusion. 5) Coronary, aortic arch, and branch vessel atherosclerotic vascular disease. 6) Remote compression fractures at T4, T6, and T7 unchanged from previous. Mild chronic superior endplate compression fracture at T12. 7) Bilateral mastectomy and prior  right axillary dissection.  History of hyperlipidemia treated with rosuvastatin 10 mg daily. LDL elevated at 120. She has not been taking rosuvastatin consistently.  06/26/23 labs reviewed today. Glucose normal. Kidney, liver functions normal. Electrolytes normal. Blood proteins normal. Absolute lymphocytes low at 495. TSH normal.  Past Medical History:  Diagnosis Date   Anemia    hx of IDA   BRCA negative 2011   BRCA I/ II negative   Breast cancer (HCC) 08/2009   LEFT=stage 2, rx with lumpectomy and xrt   Breast cancer (HCC) 2019   RIGHT   Bronchiectasis (HCC)    Chronic diastolic CHF (congestive heart failure) (HCC) 02/20/2020   Constipation    Essential hypertension 02/20/2020   Family history of adverse reaction to anesthesia    My Sister has nausea   Family history of breast cancer    Family history of colon cancer    Family history of lung cancer    GERD (gastroesophageal reflux disease)    not presently having symptoms   Hypertension    Iatrogenic pneumothorax 02/06/2019   Lung cancer (HCC) 06/06/2005   stage 1 poorly differentiated adenocarcinoma, s/p right lower lobectomy.   Lung cancer (HCC) 12/26/2022   Mycobacterium avium complex (HCC)    dx 2021   Osteopenia 09/13/2013   Osteoporosis    Personal history of lung cancer    Pneumonia    2007ish, walking pnemonia - 2009 ish   S/P lobectomy of lung 06/26/2023   STD (sexually transmitted disease)    HSV   Transaminitis 12/26/2022   Vitamin D deficiency 09/13/2013     Family History  Problem Relation Age of Onset   Allergies Mother    Asthma Mother    Lung cancer Mother  Breast cancer Mother 31       recurrence age 33   Colon cancer Father 77   Colon polyps Father 24   Prostate cancer Brother 79   Breast cancer Sister 69       Recurrence age 83 BRCA negative   Colon polyps Sister    Leukemia Sister    Breast cancer Sister 35   Prostate cancer Brother 22   Breast cancer Maternal Grandmother 28    Colon cancer Maternal Aunt    Leukemia Maternal Grandfather    Lung cancer Maternal Aunt    Breast cancer Cousin    Esophageal cancer Neg Hx    Rectal cancer Neg Hx    Stomach cancer Neg Hx     Social Hx: widowed and resides alone.     Review of Systems  Constitutional:  Negative for fever and malaise/fatigue.  HENT:  Negative for congestion.   Eyes:  Negative for blurred vision.  Respiratory:  Positive for shortness of breath. Negative for cough.   Cardiovascular:  Negative for chest pain, palpitations and leg swelling.  Gastrointestinal:  Negative for vomiting.  Musculoskeletal:  Negative for back pain.  Skin:  Negative for rash.  Neurological:  Negative for loss of consciousness and headaches.        Objective:   Vitals: BP 120/80   Pulse (!) 112   Ht 5\' 2"  (1.575 m)   Wt 100 lb (45.4 kg)   SpO2 98%   BMI 18.29 kg/m    Physical Exam Vitals and nursing note reviewed.  Constitutional:      General: She is not in acute distress.    Appearance: Normal appearance. She is not toxic-appearing.  HENT:     Head: Normocephalic and atraumatic.  Cardiovascular:     Rate and Rhythm: Regular rhythm. Tachycardia present. No extrasystoles are present.    Pulses: Normal pulses.     Heart sounds: Normal heart sounds. No murmur heard.    No friction rub. No gallop.  Pulmonary:     Effort: Pulmonary effort is normal. No respiratory distress.     Breath sounds: Normal breath sounds. No wheezing.     Comments: Coarse breath sounds right upper and mid lung fields. Decreased breath sounds RLL. Diffuse coarse breath sounds left lung. Skin:    General: Skin is warm and dry.  Neurological:     Mental Status: She is alert and oriented to person, place, and time. Mental status is at baseline.  Psychiatric:        Mood and Affect: Mood normal.        Behavior: Behavior normal.        Thought Content: Thought content normal.        Judgment: Judgment normal.       Results:    Studies obtained and personally reviewed by me:  01/13/23 CT chest showed: 1) Chronic volume loss in the right lung with varicoid bronchiectasis and a large irregular right upper lobe bulla. There is only a very small amount of functional lung on the right, primarily in the right upper lobe, similar to previous. 2) Chronic scarring and nodularity along with bronchiectasis and airway plugging in the lingula. 3) Peripheral tree-in-bud nodularity in the left lower lobe compatible with atypical infectious bronchiolitis, pattern similar to previous. 4) Stable small pericardial effusion. 5) Coronary, aortic arch, and branch vessel atherosclerotic vascular disease. 6) Remote compression fractures at T4, T6, and T7 unchanged from previous. Mild chronic superior endplate compression fracture  at T12. 7) Bilateral mastectomy and prior right axillary dissection.  Labs:       Component Value Date/Time   NA 138 06/26/2023 0912   NA 139 12/22/2016 1141   K 5.1 06/26/2023 0912   K 4.2 12/22/2016 1141   CL 101 06/26/2023 0912   CL 103 11/22/2012 1025   CO2 26 06/26/2023 0912   CO2 28 12/22/2016 1141   GLUCOSE 88 06/26/2023 0912   GLUCOSE 92 12/22/2016 1141   GLUCOSE 94 11/22/2012 1025   BUN 13 06/26/2023 0912   BUN 13.2 12/22/2016 1141   CREATININE 0.69 06/26/2023 0912   CREATININE 0.7 12/22/2016 1141   CALCIUM 10.0 06/26/2023 0912   CALCIUM 9.7 12/22/2016 1141   PROT 7.3 06/26/2023 0912   PROT 7.0 12/22/2016 1141   ALBUMIN 3.7 10/11/2021 1130   ALBUMIN 3.8 12/22/2016 1141   AST 14 06/26/2023 0912   AST 34 09/22/2020 1309   AST 18 12/22/2016 1141   ALT 8 06/26/2023 0912   ALT 82 (H) 09/22/2020 1309   ALT 10 12/22/2016 1141   ALKPHOS 89 10/11/2021 1130   ALKPHOS 81 12/22/2016 1141   BILITOT 0.5 06/26/2023 0912   BILITOT 0.3 09/22/2020 1309   BILITOT 0.65 12/22/2016 1141   GFRNONAA >60 12/02/2022 1215   GFRNONAA >60 09/22/2020 1309   GFRNONAA 93 04/16/2020 1225   GFRAA 108 04/16/2020 1225      Lab Results  Component Value Date   WBC 4.5 06/26/2023   HGB 12.0 06/26/2023   HCT 37.1 06/26/2023   MCV 91.6 06/26/2023   PLT 340 06/26/2023    Lab Results  Component Value Date   CHOL 202 (H) 06/26/2023   HDL 65 06/26/2023   LDLCALC 120 (H) 06/26/2023   TRIG 75 06/26/2023   CHOLHDL 3.1 06/26/2023    No results found for: "HGBA1C"   Lab Results  Component Value Date   TSH 2.85 06/26/2023      Assessment & Plan:   M avium infection, lower lobe necrosis: Followed by Dr. Daiva Eves at infectious disease. She is on clofazimine, ethambutol, chronic antibiotics. Specialists have discussed possibility of lung resection but pt is currently disinclined.   Hyperlipidemia: treated with rosuvastatin 10 mg daily. LDL elevated at 120. She will be more careful not to miss daily doses.  Hepatitis testing declined.  Osteoporosis  Ordered urine creatinine/microalbumin.  Urinalysis showed orange color, otherwise normal.  Vaccine counseling: UTD on flu, Covid-19, RSV vaccines.  RTC in 6 months  I,Emily Lagle,acting as a Neurosurgeon for Margaree Mackintosh, MD.,have documented all relevant documentation on the behalf of Margaree Mackintosh, MD,as directed by  Margaree Mackintosh, MD while in the presence of Margaree Mackintosh, MD.   I, Margaree Mackintosh, MD, have reviewed all documentation for this visit. The documentation on 07/09/23 for the exam, diagnosis, procedures, and orders are all accurate and complete.

## 2023-06-30 LAB — MICROALBUMIN / CREATININE URINE RATIO
Creatinine, Urine: 208 mg/dL (ref 20–275)
Microalb Creat Ratio: 22 mg/g{creat} (ref <30)
Microalb, Ur: 4.5 mg/dL

## 2023-07-04 ENCOUNTER — Ambulatory Visit: Payer: Medicare Other

## 2023-07-07 ENCOUNTER — Ambulatory Visit (INDEPENDENT_AMBULATORY_CARE_PROVIDER_SITE_OTHER): Payer: Medicare Other

## 2023-07-07 VITALS — BP 116/70 | HR 95 | Resp 20 | Ht 62.0 in | Wt 104.6 lb

## 2023-07-07 DIAGNOSIS — M8080XS Other osteoporosis with current pathological fracture, unspecified site, sequela: Secondary | ICD-10-CM

## 2023-07-07 MED ORDER — DENOSUMAB 60 MG/ML ~~LOC~~ SOSY
60.0000 mg | PREFILLED_SYRINGE | Freq: Once | SUBCUTANEOUS | Status: AC
Start: 2024-01-05 — End: 2023-07-07
  Administered 2023-07-07: 60 mg via SUBCUTANEOUS

## 2023-07-07 NOTE — Progress Notes (Unsigned)
After obtaining consent, and per orders of Dr. Lonzo Cloud , injection of Prolia given by Tera Partridge. Patient instructed to remain in clinic for 20 minutes afterwards, and to report any adverse reaction to me immediately.

## 2023-07-09 ENCOUNTER — Encounter: Payer: Self-pay | Admitting: Internal Medicine

## 2023-07-09 NOTE — Telephone Encounter (Signed)
Last Prolia inj 07/07/23 Next Prolia inj due 01/06/24

## 2023-07-09 NOTE — Patient Instructions (Addendum)
It was a pleasure to see you today. Continue current meds and return in 6 monts.

## 2023-07-17 ENCOUNTER — Other Ambulatory Visit: Payer: Medicare Other

## 2023-07-19 ENCOUNTER — Ambulatory Visit
Admission: RE | Admit: 2023-07-19 | Discharge: 2023-07-19 | Disposition: A | Discharge status: Home / Self Care | Payer: Medicare Other | Source: Ambulatory Visit | Attending: Emergency Medicine | Admitting: Emergency Medicine

## 2023-07-19 DIAGNOSIS — A312 Disseminated mycobacterium avium-intracellulare complex (DMAC): Secondary | ICD-10-CM | POA: Diagnosis not present

## 2023-07-19 DIAGNOSIS — J479 Bronchiectasis, uncomplicated: Secondary | ICD-10-CM | POA: Diagnosis not present

## 2023-07-19 DIAGNOSIS — A310 Pulmonary mycobacterial infection: Secondary | ICD-10-CM

## 2023-07-26 ENCOUNTER — Telehealth: Payer: Self-pay

## 2023-07-26 ENCOUNTER — Encounter: Payer: Self-pay | Admitting: Emergency Medicine

## 2023-07-26 NOTE — Telephone Encounter (Signed)
 I spoke to Diane with [place] radiology and requested for CT to be read.

## 2023-07-27 ENCOUNTER — Encounter: Payer: Self-pay | Admitting: Emergency Medicine

## 2023-07-27 ENCOUNTER — Ambulatory Visit: Payer: Medicare Other | Admitting: Emergency Medicine

## 2023-07-27 VITALS — BP 119/79 | HR 92 | Ht 66.0 in | Wt 103.6 lb

## 2023-07-27 DIAGNOSIS — C3431 Malignant neoplasm of lower lobe, right bronchus or lung: Secondary | ICD-10-CM

## 2023-07-27 DIAGNOSIS — A310 Pulmonary mycobacterial infection: Secondary | ICD-10-CM

## 2023-07-27 DIAGNOSIS — J479 Bronchiectasis, uncomplicated: Secondary | ICD-10-CM | POA: Diagnosis not present

## 2023-07-27 NOTE — Progress Notes (Signed)
   Subjective:    Patient ID: Anna Valentine, female    DOB: Feb 03, 1951, 73 y.o.   MRN: [id number]  HPI  ROV 07/27/2023 --follow-up visit 73 year old woman with a history of Mycobacterium avium, right lower lobe non-small cell lung cancer post lobectomy.  She has significant right lung parenchymal scarring and volume loss in the setting of this.  She was also treated with 3 months of anticoagulation for a thrombus in the postsurgical right lower lobe pulmonary artery.  She is back on azithromycin, clofazimine, ethambutol. She has occasional cough. Does sometimes get some nausea and some sweats. Overall tolerating her meds.   CT scan of the chest 07/19/2023 reviewed by me, shows persistent cystic destruction of the right lung associated with volume loss and severe bronchiectasis with consolidation/collapse at the right lung base with little change.  The left lung is hypertrophied with some subpleural radiation scarring anterior left upper lobe.  There is also left-sided cylindrical bronchiectasis with some scattered mucoid impaction and para bronchovascular nodularity, little change compared with 01/13/2023 and 09/30/2022.    Review of Systems  Constitutional:  Positive for activity change and fatigue. Negative for fever and unexpected weight change.  HENT: Negative.  Negative for congestion, dental problem, ear pain, nosebleeds, postnasal drip, rhinorrhea, sinus pressure, sneezing, sore throat and trouble swallowing.   Eyes: Negative.  Negative for redness and itching.  Respiratory: Negative.  Negative for cough, chest tightness, shortness of breath and wheezing.   Cardiovascular: Negative.  Negative for palpitations and leg swelling.  Gastrointestinal: Negative.  Negative for nausea and vomiting.  Genitourinary: Negative.  Negative for dysuria.  Musculoskeletal:  Positive for back pain. Negative for joint swelling.  Skin: Negative.  Negative for rash.  Neurological: Negative.  Negative for  headaches.  Hematological: Negative.  Does not bruise/bleed easily.  Psychiatric/Behavioral: Negative.  Negative for dysphoric mood. The patient is not nervous/anxious.        Objective:   Physical Exam Vitals:   07/27/23 1057  BP: 119/79  Pulse: 92  SpO2: 99%  Weight: 103 lb 9.6 oz (47 kg)  Height: 5\' 6"  (1.676 m)   Gen: stronger, thin woman, well-nourished, in no distress  ENT: No lesions,  mouth clear,  oropharynx clear, no postnasal drip  Neck: No JVD, no stridor  Lungs: No use of accessory muscles, coarse R lung with some inspiratory and expiratory rhonchi. L is clear  Cardiovascular: RRR, heart sounds normal, no murmur or gallops, no peripheral edema  Musculoskeletal: No deformities, no cyanosis or clubbing  Neuro: alert, non focal  Skin: Warm, no lesions or rash     Assessment & Plan:  Bronchiectasis without complication (HCC) Her CT chest is overall stable with no significant change in the left upper lobe nodules or airways abnormality.  No apparent progression either.  Good news.  It would appear that we have some degree of stability or at least suppression of her Mycobacterium avium on her current antibiotic regimen.  Plan to continue same.  Repeat her CT scan of the chest in July  Malignant neoplasm of lower lobe of right lung [facility]) Continue to follow imaging every 6 months to ensure no evidence of recurrence     Levy Pupa, MD, PhD 07/27/2023, 3:40 PM [facility] [phone] or if no answer [phone]

## 2023-07-27 NOTE — Assessment & Plan Note (Signed)
Continue to follow imaging every 6 months to ensure no evidence of recurrence

## 2023-07-27 NOTE — Assessment & Plan Note (Signed)
Her CT chest is overall stable with no significant change in the left upper lobe nodules or airways abnormality.  No apparent progression either.  Good news.  It would appear that we have some degree of stability or at least suppression of her Mycobacterium avium on her current antibiotic regimen.  Plan to continue same.  Repeat her CT scan of the chest in July

## 2023-07-27 NOTE — Patient Instructions (Addendum)
We will plan to continue your mycobacterial antibiotics as you have been taking them. Follow regularly with Dr Algis Liming We will repeat your CT scan of the chest in July 2025 Keep albuterol available to use 2 puffs when needed for shortness of breath, chest tightness, wheezing. Follow Dr. Delton Coombes in July after your CT so we can review those results together.  Please call sooner if you develop any new respiratory symptoms, any constitutional symptoms like fever, chills, sweats, weight loss.

## 2023-08-03 NOTE — Telephone Encounter (Signed)
Ct was reviewed during OV 1/16 nfn

## 2023-09-04 ENCOUNTER — Telehealth: Payer: Self-pay

## 2023-09-04 NOTE — Telephone Encounter (Signed)
 Received refill request for ethambutol - contacted patient to see if she has been able to see ophthalmology and if she is still having problems with her vision. Left voicemail asking patient to return my call.    Windsor Goeken Lesli Albee, CMA

## 2023-09-04 NOTE — Telephone Encounter (Signed)
 Patient states she is no longer having problems with her vision and has an appointment to see ophthalmology 09/07/23.  She would like to ethambutol refilled. Anna Valentine Jonathon Resides, CMA

## 2023-09-05 ENCOUNTER — Other Ambulatory Visit: Payer: Self-pay

## 2023-09-05 MED ORDER — ETHAMBUTOL HCL 400 MG PO TABS: 800.0000 mg | ORAL_TABLET | Freq: Every day | ORAL | 5 refills | Status: DC

## 2023-09-05 NOTE — Telephone Encounter (Signed)
 Refill sent.

## 2023-09-07 DIAGNOSIS — H2513 Age-related nuclear cataract, bilateral: Secondary | ICD-10-CM | POA: Diagnosis not present

## 2023-09-07 DIAGNOSIS — H35371 Puckering of macula, right eye: Secondary | ICD-10-CM | POA: Diagnosis not present

## 2023-09-07 DIAGNOSIS — H43393 Other vitreous opacities, bilateral: Secondary | ICD-10-CM | POA: Diagnosis not present

## 2023-09-07 DIAGNOSIS — H26051 Posterior subcapsular polar infantile and juvenile cataract, right eye: Secondary | ICD-10-CM | POA: Diagnosis not present

## 2023-09-26 ENCOUNTER — Telehealth: Payer: Self-pay | Admitting: Pharmacist

## 2023-09-26 NOTE — Telephone Encounter (Signed)
 2 bottles of clofazimine are located in the pharmacy office when patient needs a refill.  Hayslee Casebolt L. Javonda Suh, PharmD, BCIDP, AAHIVP, CPP Clinical Pharmacist Practitioner Infectious Diseases Clinical Pharmacist [facility] 09/26/2023, 4:20 PM

## 2023-10-02 ENCOUNTER — Encounter: Payer: Self-pay | Admitting: Infectious Disease

## 2023-10-02 DIAGNOSIS — A310 Pulmonary mycobacterial infection: Secondary | ICD-10-CM

## 2023-10-02 MED ORDER — AZITHROMYCIN 250 MG PO TABS
250.0000 mg | ORAL_TABLET | Freq: Every day | ORAL | 4 refills | Status: DC
Start: 2023-10-02 — End: 2024-01-23

## 2023-10-05 DIAGNOSIS — D1801 Hemangioma of skin and subcutaneous tissue: Secondary | ICD-10-CM | POA: Diagnosis not present

## 2023-10-05 DIAGNOSIS — L821 Other seborrheic keratosis: Secondary | ICD-10-CM | POA: Diagnosis not present

## 2023-10-05 DIAGNOSIS — L814 Other melanin hyperpigmentation: Secondary | ICD-10-CM | POA: Diagnosis not present

## 2023-10-05 DIAGNOSIS — L570 Actinic keratosis: Secondary | ICD-10-CM | POA: Diagnosis not present

## 2023-10-05 DIAGNOSIS — Z85828 Personal history of other malignant neoplasm of skin: Secondary | ICD-10-CM | POA: Diagnosis not present

## 2023-10-05 DIAGNOSIS — L720 Epidermal cyst: Secondary | ICD-10-CM | POA: Diagnosis not present

## 2023-10-05 DIAGNOSIS — D2271 Melanocytic nevi of right lower limb, including hip: Secondary | ICD-10-CM | POA: Diagnosis not present

## 2023-10-05 DIAGNOSIS — D2272 Melanocytic nevi of left lower limb, including hip: Secondary | ICD-10-CM | POA: Diagnosis not present

## 2023-11-02 ENCOUNTER — Inpatient Hospital Stay: Payer: Medicare Other | Attending: Adult Health | Admitting: Adult Health

## 2023-11-02 VITALS — BP 126/77 | HR 108 | Temp 99.0°F | Resp 18 | Wt 102.2 lb

## 2023-11-02 DIAGNOSIS — Z9221 Personal history of antineoplastic chemotherapy: Secondary | ICD-10-CM | POA: Diagnosis not present

## 2023-11-02 DIAGNOSIS — Z923 Personal history of irradiation: Secondary | ICD-10-CM | POA: Insufficient documentation

## 2023-11-02 DIAGNOSIS — M810 Age-related osteoporosis without current pathological fracture: Secondary | ICD-10-CM | POA: Insufficient documentation

## 2023-11-02 DIAGNOSIS — H9109 Ototoxic hearing loss, unspecified ear: Secondary | ICD-10-CM | POA: Diagnosis not present

## 2023-11-02 DIAGNOSIS — Z853 Personal history of malignant neoplasm of breast: Secondary | ICD-10-CM | POA: Insufficient documentation

## 2023-11-02 DIAGNOSIS — A319 Mycobacterial infection, unspecified: Secondary | ICD-10-CM | POA: Insufficient documentation

## 2023-11-02 DIAGNOSIS — F40240 Claustrophobia: Secondary | ICD-10-CM | POA: Insufficient documentation

## 2023-11-02 DIAGNOSIS — Z9013 Acquired absence of bilateral breasts and nipples: Secondary | ICD-10-CM | POA: Insufficient documentation

## 2023-11-02 DIAGNOSIS — C50212 Malignant neoplasm of upper-inner quadrant of left female breast: Secondary | ICD-10-CM

## 2023-11-02 DIAGNOSIS — Z170 Estrogen receptor positive status [ER+]: Secondary | ICD-10-CM

## 2023-11-02 NOTE — Progress Notes (Signed)
  Cancer Center Cancer Follow up:    Anna Evener, MD [address] [place] [address]   DIAGNOSIS:  Cancer Staging  Malignant neoplasm of upper-inner quadrant of left breast in female, estrogen receptor positive (HCC) Staging form: Breast, AJCC 7th Edition - Clinical: Stage IIA (T1b, N1, M0) - Signed by Willy Harvest, MD on 11/18/2014    SUMMARY OF ONCOLOGIC HISTORY: Oncology History  Malignant neoplasm of upper-inner quadrant of left breast in female, estrogen receptor positive (HCC)  06/16/2014 Initial Diagnosis   Malignant neoplasm of upper-inner quadrant of left breast in female, estrogen receptor positive (HCC)    Genetic Testing   Positive genetic test result. 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 TP53 is associated with Li-Fraumeni syndrome. The STAT Breast cancer panel offered by [facility] includes sequencing and rearrangement analysis for the following 9 genes:  ATM, BRCA1, BRCA2, CDH1, CHEK2, PALB2, PTEN, STK11 and TP53. The results of her Common Hereditary Cancer Panel test are still pending.   Report date is 02/20/2018.   Positive genetic test result. 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 The Common Hereditary Cancers Panel offered by [facility] includes sequencing and/or deletion duplication testing of the following 47 genes: APC, ATM, AXIN2, BARD1, BMPR1A, BRCA1, BRCA2, BRIP1, CDH1, CDKN2A (p14ARF), CDKN2A (p16INK4a), CKD4, CHEK2, CTNNA1, DICER1, EPCAM (Deletion/duplication testing only), GREM1 (promoter region deletion/duplication testing only), KIT, MEN1, MLH1, MSH2, MSH3, MSH6, MUTYH, NBN, NF1, NHTL1, PALB2, PDGFRA, PMS2, POLD1, POLE, PTEN, RAD50, RAD51C, RAD51D, SDHB, SDHC, SDHD, SMAD4, SMARCA4. STK11, TP53, TSC1, TSC2, and VHL.  The following genes were evaluated for sequence changes only: SDHA and HOXB13 c.251G>A variant  only.  Report date is 02/22/2018.   04/27/2018 - 04/27/2018 Chemotherapy   The patient had trastuzumab  (HERCEPTIN ) 210 mg in sodium chloride  0.9 % 250 mL chemo infusion, 4 mg/kg = 210 mg, Intravenous,  Once, 1 of 16 cycles Administration: 210 mg (04/27/2018) PACLitaxel  (TAXOL ) 126 mg in sodium chloride  0.9 % 250 mL chemo infusion (</= 80mg /m2), 80 mg/m2 = 126 mg, Intravenous,  Once, 1 of 3 cycles Administration: 126 mg (04/27/2018)  for chemotherapy treatment.    05/03/2018 - 07/06/2018 Chemotherapy   The patient had PACLitaxel -protein bound (ABRAXANE ) chemo infusion 150 mg, 100 mg/m2 = 150 mg, Intravenous, Once, 3 of 4 cycles Administration: 150 mg (05/03/2018), 150 mg (05/10/2018), 150 mg (05/17/2018), 150 mg (05/31/2018), 150 mg (06/08/2018), 150 mg (06/21/2018), 150 mg (06/28/2018), 150 mg (07/06/2018)  for chemotherapy treatment.    05/03/2018 - 06/08/2018 Chemotherapy   The patient had trastuzumab  (HERCEPTIN ) 105 mg in sodium chloride  0.9 % 250 mL chemo infusion, 2 mg/kg = 105 mg (100 % of original dose 2 mg/kg), Intravenous,  Once, 6 of 6 cycles Dose modification: 2 mg/kg (original dose 2 mg/kg, Cycle 1, Reason: Provider Judgment) Administration: 105 mg (05/03/2018), 105 mg (05/10/2018), 105 mg (05/17/2018), 105 mg (05/24/2018), 105 mg (05/31/2018), 105 mg (06/08/2018)  for chemotherapy treatment.    05/04/2018 - 05/04/2018 Chemotherapy   The patient had trastuzumab  (HERCEPTIN ) 105 mg in sodium chloride  0.9 % 250 mL chemo infusion, 2 mg/kg = 105 mg (25 % of original dose 8 mg/kg), Intravenous,  Once, 0 of 5 cycles Dose modification: 2 mg/kg (original dose 8 mg/kg, Cycle 1, Reason: Provider Judgment)  for chemotherapy treatment.    07/13/2018 - 10/24/2018 Chemotherapy   The patient had trastuzumab  (HERCEPTIN ) 450 mg in sodium chloride  0.9 %  250 mL chemo infusion, 441 mg, Intravenous,  Once, 6 of 15 cycles Administration: 450 mg (07/13/2018), 300 mg (08/01/2018), 300 mg (10/03/2018), 300 mg  (08/22/2018), 300 mg (09/12/2018), 300 mg (10/24/2018)  for chemotherapy treatment.      CURRENT THERAPY: observation   INTERVAL HISTORY:  Discussed the use of AI scribe software for clinical note transcription with the patient, who gave verbal consent to proceed.  Anna Valentine 73 y.o. female returns for with a history of mycobacterium infection and bilateral mastectomy for breast cancer, presents with persistent shortness of breath. She reports that all other symptoms related to the mycobacterium infection have been controlled with medication, but the shortness of breath remains a significant issue. She is currently under the care of Dr. Baldwin Levee for this issue and is undergoing regular CT scans. The patient acknowledges that she is limited in her activities due to the shortness of breath, but she is able to manage as long as there is a chair nearby for rest.  The patient also reports occasional lightheadedness and hearing issues, which she attributes to the azithromycin  medication she is taking. She has been fitted with hearing aids and the azithromycin  dosage has been reduced to 250mg . Despite these issues, the patient is adamant about continuing the medication as previous attempts to discontinue it were unsuccessful.  The patient has a TP53 mutation and has undergone bilateral mastectomy. She has regular colonoscopies and endoscopies, the last of which was a couple of years ago. She is not currently undergoing brain MRIs due to severe claustrophobia. She has regular skin screenings and has stopped taking letrozole  since December.   Patient Active Problem List   Diagnosis Date Noted   S/P lobectomy of lung 06/26/2023   Dyspnea on exertion 01/19/2023   Transaminitis 12/26/2022   Lung cancer (HCC) 12/26/2022   Diarrhea 04/21/2022   Posterior vitreous detachment of both eyes 03/10/2022   Localized osteoporosis with current pathological fracture 08/25/2021   Hearing loss 11/04/2020   Skin  rash 02/28/2020   Hyponatremia    Dehydration with hyponatremia 02/20/2020   Intractable nausea and vomiting 02/20/2020   Low back pain 02/20/2020   Chronic diastolic CHF (congestive heart failure) (HCC) 02/20/2020   Essential hypertension 02/20/2020   Acute metabolic encephalopathy 02/20/2020   Mycobacterium avium infection (HCC) 02/11/2020   Right epiretinal membrane 01/30/2020   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   Nuclear sclerotic cataract of right eye 01/30/2020   Nuclear sclerotic cataract of left eye 01/30/2020   Iron deficiency anemia 01/20/2020   Malignant neoplasm of overlapping sites of right breast in female, estrogen receptor positive (HCC) 09/20/2019   Weight loss, non-intentional 09/20/2019   Postprocedural pneumothorax    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   Port-A-Cath in place 04/26/2018   Genetic testing 02/20/2018   Family history of breast cancer    Family history of lung cancer    Personal history of lung cancer    Chest pain 11/28/2017   Abnormal CT of the chest 11/30/2016   Malignant neoplasm of upper-inner quadrant of left breast in female, estrogen receptor positive (HCC) 06/16/2014   Osteopenia 09/13/2013   Postmenopausal atrophic vaginitis 09/13/2013   Vitamin D  deficiency 09/13/2013   Malignant neoplasm of lower lobe of right lung (HCC) 07/24/2012   Anxiety 03/11/2011   History of neutropenia 03/11/2011   Family history of colon cancer 03/11/2011   Cough 11/15/2010   Bronchiectasis without complication (HCC) 11/15/2010    is allergic to rifampin , taxol  [paclitaxel ], paclitaxel  (protein-bound), tape, abraxane  [paclitaxel   protein-bound part], and clarithromycin.  MEDICAL HISTORY: Past Medical History:  Diagnosis Date   Anemia    hx of IDA   BRCA negative 2011   BRCA I/ II negative   Breast cancer (HCC) 08/2009   LEFT=stage 2, rx with lumpectomy and xrt   Breast cancer (HCC) 2019   RIGHT   Bronchiectasis (HCC)    Chronic diastolic CHF (congestive  heart failure) (HCC) 02/20/2020   Constipation    Essential hypertension 02/20/2020   Family history of adverse reaction to anesthesia    My Sister has nausea   Family history of breast cancer    Family history of colon cancer    Family history of lung cancer    GERD (gastroesophageal reflux disease)    not presently having symptoms   Hypertension    Iatrogenic pneumothorax 02/06/2019   Lung cancer (HCC) 06/06/2005   stage 1 poorly differentiated adenocarcinoma, s/p right lower lobectomy.   Lung cancer (HCC) 12/26/2022   Mycobacterium avium complex (HCC)    dx 2021   Osteopenia 09/13/2013   Osteoporosis    Personal history of lung cancer    Pneumonia    2007ish, walking pnemonia - 2009 ish   S/P lobectomy of lung 06/26/2023   STD (sexually transmitted disease)    HSV   Transaminitis 12/26/2022   Vitamin D  deficiency 09/13/2013    SURGICAL HISTORY: Past Surgical History:  Procedure Laterality Date   BREAST BIOPSY     BREAST LUMPECTOMY  10/12/2009   Left lumpectomy and radiation, stage II, ER/PR+, Her 2 nu negative   BUNIONECTOMY Bilateral    COLONOSCOPY  2018   SA-MAC-suprep-no polyps   COLONOSCOPY     LOBECTOMY Right 06/06/2005   [facility]   MASTECTOMY W/ SENTINEL NODE BIOPSY Bilateral 03/15/2018   MASTECTOMY W/ SENTINEL NODE BIOPSY Bilateral 03/15/2018   Procedure: BILATERAL TOTAL MASTECTOMIES WITH RIGHT SENTINEL LYMPH NODE BIOPSY;  Surgeon: Enid Harry, MD;  Location[facility] OR;  Service: General;  Laterality: Bilateral;   PORTACATH PLACEMENT N/A 03/15/2018   Procedure: INSERTION PORT-A-CATH WITH US ;  Surgeon: Enid Harry, MD;  Location: [facility] OR;  Service: General;  Laterality: N/A;   TONSILLECTOMY     TUBAL LIGATION  1984   VIDEO BRONCHOSCOPY  02/06/2019   Flexible video fiberoptic bronchoscopy with electromagnetic navigation and biopsies.   VIDEO BRONCHOSCOPY WITH ENDOBRONCHIAL NAVIGATION Left 02/06/2019   Procedure: VIDEO BRONCHOSCOPY WITH ENDOBRONCHIAL  NAVIGATION, left lung;  Surgeon: Denson Flake, MD;  Location: [facility] OR;  Service: Thoracic;  Laterality: Left;   VIDEO BRONCHOSCOPY WITH ENDOBRONCHIAL NAVIGATION N/A 01/08/2020   Procedure: VIDEO BRONCHOSCOPY WITH ENDOBRONCHIAL NAVIGATION;  Surgeon: Denson Flake, MD;  Location: [facility] OR;  Service: Thoracic;  Laterality: N/A;    SOCIAL HISTORY: Social History   Socioeconomic History   Marital status: Single    Spouse name: Not on file   Number of children: 0   Years of education: Not on file   Highest education level: Bachelor's degree (e.g., BA, AB, BS)  Occupational History   Occupation: OFFICE MANAGER    Employer: [employer]  Tobacco Use   Smoking status: Former    Current packs/day: 0.00    Average packs/day: 2.0 packs/day for 18.0 years (36.0 ttl pk-yrs)    Types: Cigarettes    Start date: 07/11/1969    Quit date: 07/12/1987    Years since quitting: 36.3   Smokeless tobacco: Never  Vaping Use   Vaping status: Never Used  Substance and Sexual Activity  Alcohol use: Yes    Comment: occasionally   Drug use: No   Sexual activity: Yes  Other Topics Concern   Not on file  Social History Narrative   Not on file   Social Drivers of Health   Financial Resource Strain: Low Risk  (06/26/2023)   Overall Financial Resource Strain (CARDIA)    Difficulty of Paying Living Expenses: Not hard at all  Food Insecurity: No Food Insecurity (06/26/2023)   Hunger Vital Sign    Worried About Running Out of Food in the Last Year: Never true    Ran Out of Food in the Last Year: Never true  Transportation Needs: No Transportation Needs (06/26/2023)   PRAPARE - Administrator, Civil Service (Medical): No    Lack of Transportation (Non-Medical): No  Physical Activity: Inactive (06/29/2023)   Exercise Vital Sign    Days of Exercise per Week: 0 days    Minutes of Exercise per Session: 0 min  Stress: No Stress Concern Present (06/26/2023)   [employer] of Occupational  Health - Occupational Stress Questionnaire    Feeling of Stress : Not at all  Social Connections: Moderately Integrated (06/26/2023)   Social Connection and Isolation Panel [NHANES]    Frequency of Communication with Friends and Family: More than three times a week    Frequency of Social Gatherings with Friends and Family: More than three times a week    Attends Religious Services: More than 4 times per year    Active Member of [employer] or Organizations: Yes    Attends Banker Meetings: Not on file    Marital Status: Widowed  Intimate Partner Violence: Not At Risk (05/26/2021)   Humiliation, Afraid, Rape, and Kick questionnaire    Fear of Current or Ex-Partner: No    Emotionally Abused: No    Physically Abused: No    Sexually Abused: No    FAMILY HISTORY: Family History  Problem Relation Age of Onset   Allergies Mother    Asthma Mother    Lung cancer Mother    Breast cancer Mother 63       recurrence age 75   Colon cancer Father 63   Colon polyps Father 25   Prostate cancer Brother 58   Breast cancer Sister 71       Recurrence age 22 BRCA negative   Colon polyps Sister    Leukemia Sister    Breast cancer Sister 20   Prostate cancer Brother 53   Breast cancer Maternal Grandmother 38   Colon cancer Maternal Aunt    Leukemia Maternal Grandfather    Lung cancer Maternal Aunt    Breast cancer Cousin    Esophageal cancer Neg Hx    Rectal cancer Neg Hx    Stomach cancer Neg Hx     Review of Systems  Constitutional:  Negative for appetite change, chills, fatigue, fever and unexpected weight change.  HENT:   Negative for hearing loss, lump/mass and trouble swallowing.   Eyes:  Negative for eye problems and icterus.  Respiratory:  Negative for chest tightness, cough and shortness of breath.   Cardiovascular:  Negative for chest pain, leg swelling and palpitations.  Gastrointestinal:  Negative for abdominal distention, abdominal pain, constipation, diarrhea, nausea  and vomiting.  Endocrine: Negative for hot flashes.  Genitourinary:  Negative for difficulty urinating.   Musculoskeletal:  Negative for arthralgias.  Skin:  Negative for itching and rash.  Neurological:  Negative for dizziness, extremity weakness, headaches  and numbness.  Hematological:  Negative for adenopathy. Does not bruise/bleed easily.  Psychiatric/Behavioral:  Negative for depression. The patient is not nervous/anxious.       PHYSICAL EXAMINATION    Vitals:   11/02/23 1131  BP: 126/77  Pulse: (!) 108  Resp: 18  Temp: 99 F (37.2 C)  SpO2: 100%    Physical Exam Constitutional:      General: She is not in acute distress.    Appearance: Normal appearance. She is not toxic-appearing.  HENT:     Head: Normocephalic and atraumatic.     Mouth/Throat:     Mouth: Mucous membranes are moist.     Pharynx: Oropharynx is clear. No oropharyngeal exudate or posterior oropharyngeal erythema.  Eyes:     General: No scleral icterus. Cardiovascular:     Rate and Rhythm: Normal rate and regular rhythm.     Pulses: Normal pulses.     Heart sounds: Normal heart sounds.  Pulmonary:     Effort: Pulmonary effort is normal.     Breath sounds: Normal breath sounds.  Chest:     Comments: S/p bilateral mastectomy, no sign of local recurrence Abdominal:     General: Abdomen is flat. Bowel sounds are normal. There is no distension.     Palpations: Abdomen is soft.     Tenderness: There is no abdominal tenderness.  Musculoskeletal:        General: No swelling.     Cervical back: Neck supple.  Lymphadenopathy:     Cervical: No cervical adenopathy.     Upper Body:     Right upper body: No supraclavicular or axillary adenopathy.     Left upper body: No supraclavicular or axillary adenopathy.  Skin:    General: Skin is warm and dry.     Findings: No rash.  Neurological:     General: No focal deficit present.     Mental Status: She is alert.  Psychiatric:        Mood and Affect:  Mood normal.        Behavior: Behavior normal.       ASSESSMENT and THERAPY PLAN:   Malignant neoplasm of upper-inner quadrant of left breast in female, estrogen receptor positive (HCC) 06/06/2005: Right upper lobe lung wedge resection with mediastinal lymph node dissection: 1.2 cm grade 3 adenocarcinoma T1N1 stage Ia (followed at [facility]) ------------------------------------------------------------ Left breast cancer 09/02/2009: Invasive lobular cancer ER 98%, PR 96%, Ki-67 14%, HER2 negative 10/12/2009: Left lumpectomy with sentinel lymph node biopsy: T1BN1 stage IIa invasive lobular cancer (Oncotype DX 16: ROR 10%) 01/28/2010: Adjuvant radiation July 2011-July 2016: Anastrozole   ------------------------------------------------ Right breast cancer 01/29/2018: T1CN0 stage Ia IDC grade 2 ER positive PR negative HER2 amplified Ki-67 15% 03/15/2018: Bilateral mastectomies, right breast T1CN0 stage Ia grade 2 IDC 0/6 lymph nodes 04/26/2018: Taxol  Herceptin  (switch to Abraxane  because of initial reaction, discontinued 07/13/2018) completed 10 cycles   Current treatment: Tamoxifen  started 08/01/2018 switched to anastrozole  04/19/2021 because of blood clot concerns Osteoporosis: Bone density 02/21/2012 at [facility] T score -2.5: Will be starting Prolia  (with her PCP) along with calcium  and vitamin D  She will complete antiestrogen therapy by December 2025   Genetics: T p53 pathogenic variant   Breast cancer surveillance: 1.  Breast exam 11/02/2023: Benign 2. no role of mammograms since she had bilateral mastectomies   Breast cancer No recurrence or new cancer. Occasional breast pain likely due to scarring. Screenings and exams are current. Letrozole  discontinued as of December. - Continue annual  follow-up visits. - Monitor for new symptoms, especially headaches or neurological changes. - Schedule follow-up for next year.  Mycobacterium infection Ongoing management with significant shortness of breath  despite medication. Under care of Dr. Sherlie Distance.  Shortness of breath Persistent due to mycobacterium infection. Managed by Dr. Sherlie Distance. Reports limitations but adapts by ensuring access to seating.  Hearing loss due to medication Associated with azithromycin  use. Using hearing aids. Azithromycin  reduced to 250 mg, but hearing issues persist. Prefers to continue medication due to previous unsuccessful discontinuation attempts.  Severe claustrophobia Impacts ability to undergo MRI. Agrees to avoid brain MRI without symptoms. Discussed MRI under anesthesia if needed. - Consider MRI under anesthesia if symptoms develop.  RTC in 1 year for continued follow up   All questions were answered. The patient knows to call the clinic with any problems, questions or concerns. We can certainly see the patient much sooner if necessary.  Total encounter time:20 minutes*in face-to-face visit time, chart review, lab review, care coordination, order entry, and documentation of the encounter time.    Alwin Baars, NP 11/05/23 10:40 PM Medical Oncology and Hematology [facility] [address] [place], [place] [address] Tel. [phone]    Fax. [phone]  *Total Encounter Time as defined by the Centers for Medicare and Medicaid Services includes, in addition to the face-to-face time of a patient visit (documented in the note above) non-face-to-face time: obtaining and reviewing outside history, ordering and reviewing medications, tests or procedures, care coordination (communications with other health care professionals or caregivers) and documentation in the medical record.

## 2023-11-05 ENCOUNTER — Encounter: Payer: Self-pay | Admitting: Adult Health

## 2023-11-05 ENCOUNTER — Encounter: Payer: Self-pay | Admitting: Oncology

## 2023-11-05 NOTE — Assessment & Plan Note (Signed)
 06/06/2005: Right upper lobe lung wedge resection with mediastinal lymph node dissection: 1.2 cm grade 3 adenocarcinoma T1N1 stage Ia (followed at [facility]) ------------------------------------------------------------ Left breast cancer 09/02/2009: Invasive lobular cancer ER 98%, PR 96%, Ki-67 14%, HER2 negative 10/12/2009: Left lumpectomy with sentinel lymph node biopsy: T1BN1 stage IIa invasive lobular cancer (Oncotype DX 16: ROR 10%) 01/28/2010: Adjuvant radiation July 2011-July 2016: Anastrozole   ------------------------------------------------ Right breast cancer 01/29/2018: T1CN0 stage Ia IDC grade 2 ER positive PR negative HER2 amplified Ki-67 15% 03/15/2018: Bilateral mastectomies, right breast T1CN0 stage Ia grade 2 IDC 0/6 lymph nodes 04/26/2018: Taxol  Herceptin  (switch to Abraxane  because of initial reaction, discontinued 07/13/2018) completed 10 cycles   Current treatment: Tamoxifen  started 08/01/2018 switched to anastrozole  04/19/2021 because of blood clot concerns Osteoporosis: Bone density 02/21/2012 at [facility] T score -2.5: Will be starting Prolia  (with her PCP) along with calcium  and vitamin D  She will complete antiestrogen therapy by December 2025   Genetics: T p53 pathogenic variant   Breast cancer surveillance: 1.  Breast exam 11/02/2023: Benign 2. no role of mammograms since she had bilateral mastectomies   Breast cancer No recurrence or new cancer. Occasional breast pain likely due to scarring. Screenings and exams are current. Letrozole  discontinued as of December. - Continue annual follow-up visits. - Monitor for new symptoms, especially headaches or neurological changes. - Schedule follow-up for next year.  Mycobacterium infection Ongoing management with significant shortness of breath despite medication. Under care of Dr. Sherlie Distance.  Shortness of breath Persistent due to mycobacterium infection. Managed by Dr. Sherlie Distance. Reports limitations but adapts by ensuring access to  seating.  Hearing loss due to medication Associated with azithromycin  use. Using hearing aids. Azithromycin  reduced to 250 mg, but hearing issues persist. Prefers to continue medication due to previous unsuccessful discontinuation attempts.  Severe claustrophobia Impacts ability to undergo MRI. Agrees to avoid brain MRI without symptoms. Discussed MRI under anesthesia if needed. - Consider MRI under anesthesia if symptoms develop.  RTC in 1 year for continued follow up

## 2023-11-06 ENCOUNTER — Encounter: Payer: Self-pay | Admitting: Infectious Disease

## 2023-11-06 ENCOUNTER — Other Ambulatory Visit (HOSPITAL_COMMUNITY): Payer: Self-pay

## 2023-11-06 ENCOUNTER — Telehealth: Payer: Self-pay | Admitting: Pharmacist

## 2023-11-06 NOTE — Telephone Encounter (Signed)
 Patient picked up 2 bottles of clofazimine  today. Supply should last for ~3 months. Next refill due around the end of July/beginning of August.  Jailene Cupit L. Joaquina Nissen, PharmD, BCIDP, AAHIVP, CPP Clinical Pharmacist Practitioner Infectious Diseases Clinical Pharmacist [facility] 11/06/2023, 1:47 PM

## 2023-11-30 ENCOUNTER — Other Ambulatory Visit (HOSPITAL_COMMUNITY): Payer: Self-pay | Admitting: Cardiology

## 2023-11-30 MED ORDER — ROSUVASTATIN CALCIUM 10 MG PO TABS: 10.0000 mg | ORAL_TABLET | Freq: Every day | ORAL | 11 refills | Status: AC

## 2023-12-12 NOTE — Telephone Encounter (Signed)
 Prolia  VOB initiated via [email or web address]  Next Prolia  inj DUE: 01/06/24

## 2023-12-26 ENCOUNTER — Ambulatory Visit: Payer: Medicare Other | Admitting: Infectious Disease

## 2024-01-01 NOTE — Telephone Encounter (Signed)
 [employer]  Patient is ready for scheduling on or after 01/06/24  Out-of-pocket cost due at time of visit: $0  Primary: [place]  Medicare Prolia  co-insurance: 20% (approximately $331.87) Admin fee co-insurance: 20% (approximately $25)  Deductible: $257 of $257 met  Prior Auth: NOT required  Secondary: Aetna Medicare Supplement Plan G Prolia  co-insurance:  Admin fee co-insurance:   Deductible: does NOT cover Medicare deductible of which $257 of $257 has been met  Prior Auth: NOT required PA# Valid:   ** This summary of benefits is an estimation of the patient's out-of-pocket cost. Exact cost may vary based on individual plan coverage.

## 2024-01-01 NOTE — Telephone Encounter (Signed)
 Medical [employer] and [employer] Stable - Prior Authorization NOT required for [employer]

## 2024-01-09 ENCOUNTER — Ambulatory Visit (INDEPENDENT_AMBULATORY_CARE_PROVIDER_SITE_OTHER): Payer: Medicare Other

## 2024-01-09 VITALS — BP 110/80 | HR 100 | Resp 20 | Ht 66.0 in | Wt 102.8 lb

## 2024-01-09 DIAGNOSIS — M8080XS Other osteoporosis with current pathological fracture, unspecified site, sequela: Secondary | ICD-10-CM

## 2024-01-09 MED ORDER — DENOSUMAB 60 MG/ML ~~LOC~~ SOSY
60.0000 mg | PREFILLED_SYRINGE | Freq: Once | SUBCUTANEOUS | Status: AC
Start: 2024-01-09 — End: 2024-01-09
  Administered 2024-01-09: 60 mg via SUBCUTANEOUS

## 2024-01-09 MED ORDER — DENOSUMAB 60 MG/ML ~~LOC~~ SOSY
60.0000 mg | PREFILLED_SYRINGE | Freq: Once | SUBCUTANEOUS | Status: AC
Start: 2024-07-11 — End: ?

## 2024-01-09 NOTE — Progress Notes (Signed)
 After obtaining consent, and per orders of Dr. Lonzo Cloud, injection of Prolia given by Tera Partridge. Patient instructed to remain in clinic for 20 minutes afterwards, and to report any adverse reaction to me immediately.

## 2024-01-15 ENCOUNTER — Ambulatory Visit
Admission: RE | Admit: 2024-01-15 | Discharge: 2024-01-15 | Disposition: A | Discharge status: Home / Self Care | Payer: Medicare Other | Source: Ambulatory Visit | Attending: Emergency Medicine | Admitting: Emergency Medicine

## 2024-01-15 DIAGNOSIS — A310 Pulmonary mycobacterial infection: Secondary | ICD-10-CM

## 2024-01-15 DIAGNOSIS — I3139 Other pericardial effusion (noninflammatory): Secondary | ICD-10-CM | POA: Diagnosis not present

## 2024-01-15 DIAGNOSIS — R918 Other nonspecific abnormal finding of lung field: Secondary | ICD-10-CM | POA: Diagnosis not present

## 2024-01-15 DIAGNOSIS — Z902 Acquired absence of lung [part of]: Secondary | ICD-10-CM | POA: Diagnosis not present

## 2024-01-15 DIAGNOSIS — Z85118 Personal history of other malignant neoplasm of bronchus and lung: Secondary | ICD-10-CM | POA: Diagnosis not present

## 2024-01-15 NOTE — Telephone Encounter (Signed)
 Last Prolia  inj 01/09/24 Next Prolia  inj due 07/12/24

## 2024-01-22 ENCOUNTER — Encounter: Payer: Self-pay | Admitting: Internal Medicine

## 2024-01-23 ENCOUNTER — Encounter: Payer: Self-pay | Admitting: Internal Medicine

## 2024-01-23 ENCOUNTER — Ambulatory Visit (INDEPENDENT_AMBULATORY_CARE_PROVIDER_SITE_OTHER): Admitting: Internal Medicine

## 2024-01-23 VITALS — BP 110/80 | HR 104 | Temp 98.2°F | Ht 66.0 in | Wt 102.0 lb

## 2024-01-23 DIAGNOSIS — Z85118 Personal history of other malignant neoplasm of bronchus and lung: Secondary | ICD-10-CM

## 2024-01-23 DIAGNOSIS — H1033 Unspecified acute conjunctivitis, bilateral: Secondary | ICD-10-CM

## 2024-01-23 DIAGNOSIS — Z8619 Personal history of other infectious and parasitic diseases: Secondary | ICD-10-CM | POA: Diagnosis not present

## 2024-01-23 MED ORDER — TOBRAMYCIN-DEXAMETHASONE 0.3-0.1 % OP SUSP: OPHTHALMIC | 0 refills | Status: DC

## 2024-01-23 NOTE — Progress Notes (Signed)
 Patient Care Team: Perri Ronal PARAS, MD as PCP - General (Internal Medicine) Shelah Lamar RAMAN, MD as Consulting Physician (Pulmonary Disease) Medora Ned, MD as Referring Physician Armbruster, Elspeth SQUIBB, MD as Consulting Physician (Gastroenterology) Nada Macintosh, FNP (Family Medicine) Shannon Agent, MD as Consulting Physician (Radiation Oncology) Ebbie Cough, MD as Consulting Physician (General Surgery) Rolan Ezra RAMAN, MD as Consulting Physician (Cardiology) Bensimhon, Toribio SAUNDERS, MD as Consulting Physician (Cardiology) Crawford Morna Pickle, NP as Nurse Practitioner (Hematology and Oncology)  Visit Date: 01/23/24  Subjective:   Chief Complaint  Patient presents with   Eye Itching    Itchy in inner corner of right eye since May.    Patient PI:Anna Valentine, Anna Valentine DOB:20-Feb-1951,73 y.o. FMW:[id number]   73 y.o.Female presents today for acute sick visit with Itching Eye, Right. Patient has a past medical history of Lung Cancer; S/p Right Lower Lobectomy; Hypertension; Chronic Diastolic CHF. She says that itching began in May, will occur at the left-side corner of the right eye and occasionally does extend over the lid, for which she has tried Zaditor. Denies redness or pain/discomfort, but does say it is aggravating and itching causes some irritation.   History of Lung Cancer with annual CXRs, most recently 01/16/2024 showing new tree-in-bud micronodular disease in the lateral left lower lobe, and new ground-glass intermixed with micronodular disease medially in the left lower lobe superior segment, which was discussed. Past Medical History:  Diagnosis Date   Anemia    hx of IDA   BRCA negative 2011   BRCA I/ II negative   Breast cancer (HCC) 08/2009   LEFT=stage 2, rx with lumpectomy and xrt   Breast cancer (HCC) 2019   RIGHT   Bronchiectasis (HCC)    Chronic diastolic CHF (congestive heart failure) (HCC) 02/20/2020   Constipation    Essential hypertension  02/20/2020   Family history of adverse reaction to anesthesia    My Sister has nausea   Family history of breast cancer    Family history of colon cancer    Family history of lung cancer    GERD (gastroesophageal reflux disease)    not presently having symptoms   Hypertension    Iatrogenic pneumothorax 02/06/2019   Lung cancer (HCC) 06/06/2005   stage 1 poorly differentiated adenocarcinoma, s/p right lower lobectomy.   Lung cancer (HCC) 12/26/2022   Mycobacterium avium complex (HCC)    dx 2021   Osteopenia 09/13/2013   Osteoporosis    Personal history of lung cancer    Pneumonia    2007ish, walking pnemonia - 2009 ish   S/P lobectomy of lung 06/26/2023   STD (sexually transmitted disease)    HSV   Transaminitis 12/26/2022   Vitamin D  deficiency 09/13/2013    Allergies  Allergen Reactions   Rifampin  Nausea And Vomiting   Taxol  [Paclitaxel ] Anaphylaxis   Paclitaxel  (Protein-Bound) Hives   Tape Itching   Abraxane  [Paclitaxel  Protein-Bound Part] Rash   Clarithromycin Rash    Family History  Problem Relation Age of Onset   Allergies Mother    Asthma Mother    Lung cancer Mother    Breast cancer Mother 58       recurrence age 62   Colon cancer Father 6   Colon polyps Father 4   Prostate cancer Brother 72   Breast cancer Sister 33       Recurrence age 29 BRCA negative   Colon polyps Sister    Leukemia Sister    Breast cancer Sister 26  Prostate cancer Brother 61   Breast cancer Maternal Grandmother 109   Colon cancer Maternal Aunt    Leukemia Maternal Grandfather    Lung cancer Maternal Aunt    Breast cancer Cousin    Esophageal cancer Neg Hx    Rectal cancer Neg Hx    Stomach cancer Neg Hx    Social History   Social History Narrative   Not on file  Teaches at [employer]  Review of Systems  Eyes:  Negative for pain and redness.  Skin:  Positive for itching (left corner, right eye).  All other systems reviewed and are negative.    Objective:   Vitals: BP 110/80   Pulse (!) 104   Temp 98.2 F (36.8 C)   Ht 5' 6 (1.676 m)   Wt 102 lb (46.3 kg)   SpO2 96%   BMI 16.46 kg/m   Physical Exam HENT:     Head:      Comments: Per patient, itching occurs at the left corner and will occur over the entirety of the lid Eyes:     General: Lids are everted, no foreign bodies appreciated.        Right eye: No foreign body, discharge or hordeolum.        Left eye: No foreign body, discharge or hordeolum.     Conjunctiva/sclera:     Right eye: Right conjunctiva is injected. No exudate.    Left eye: Left conjunctiva is not injected. No exudate.    Results:  Studies Obtained And Personally Reviewed By Me:  CT CHEST WITHOUT CONTRAST 01/16/2024  CLINICAL DATA:  Non-small cell lung cancer follow-up. Bronchiectasis. MAC infection. Previous lobectomy.   COMPARISON:  Chest CTs without contrast 07/19/2023, 01/13/2023 and 09/30/2022   FINDINGS: Cardiovascular: The heart shifted to the right chest due to volume loss in the right thorax. Cardiac size is normal.   The coronary arteries are heavily calcified. There is a tortuous aorta with atherosclerosis in the aorta and great vessels.   The pulmonary arteries and veins are normal caliber. There is a small anterior pericardial effusion.   Mediastinum/Nodes: There is a borderline prominent precarinal lymph node, 8 mm short axis on 2:50 which has been stable over prior studies.   No enlarged intrathoracic lymph nodes are seen without contrast, with limited assessment for hilar adenopathy.   There are clips in the right axilla but no hilar adenopathy. Thyroid  gland is unremarkable.   The trachea and main bronchi are clear. Negative thoracic esophagus.   Lungs/Pleura: There are old postsurgical changes of right lower lobectomy, chronic cystic necrotic change to the right lung with marked volume loss and severe varicoid bronchiectasis in the residual fibrotic tissue in the lower  lung field.   There are chronic calcifications studding the pleural surface on the right. Chronic cavitary disease in the right upper lung field above the carinal level.   There is compensatory hyperinflation of the left lung. Subpleural XRT related scarring in the anterior aspect of the left upper lobe.   There are scattered mucoid impactions and peribronchovascular nodularity in the left lung with new tree-in-bud micronodular disease in the lateral left lower lobe, and new ground-glass intermixed with micronodular disease medially in the left lower lobe superior segment.   There are granulomatous calcifications in the lingular base, and unchanged cylindrical bronchiectasis in the left lung.   Upper Abdomen: No acute abnormality. There is a 3.5 cm Bosniak 1 cyst in the superior pole of the left kidney.  Chronic linear calcification right adrenal gland. Abdominal aortic atherosclerosis.   Musculoskeletal: There is osteopenia, kyphosis and degenerative changes of the thoracic spine and multilevel chronic compression fractures. No regional skeletal fracture or metastasis is seen.   IMPRESSION: 1. New tree-in-bud micronodular disease in the lateral left lower lobe, and new ground-glass intermixed with micronodular disease medially in the left lower lobe superior segment. Findings are consistent with atypical infection, possibly MAC.  2. Old postsurgical changes of right lower lobectomy with chronic cystic necrotic change to the right lung with marked volume loss and severe varicoid bronchiectasis in the residual fibrotic tissue in the lower lung field.  3. Chronic cavitary disease in the right upper lung field above the carinal level.  4. Compensatory hyperinflation of the left lung with subpleural XRT related scarring in the anterior aspect of the left upper lobe.  5. Aortic and coronary artery atherosclerosis.  6. Small anterior pericardial effusion, stable.  7. Osteopenia,  kyphosis and degenerative changes of the thoracic spine with multilevel chronic compression fractures.  8. Bosniak 1 cyst in the superior pole of the left kidney.   Aortic Atherosclerosis (ICD10-I70.0).    Labs:     Component Value Date/Time   NA 138 06/26/2023 0912   NA 139 12/22/2016 1141   K 5.1 06/26/2023 0912   K 4.2 12/22/2016 1141   CL 101 06/26/2023 0912   CL 103 11/22/2012 1025   CO2 26 06/26/2023 0912   CO2 28 12/22/2016 1141   GLUCOSE 88 06/26/2023 0912   GLUCOSE 92 12/22/2016 1141   GLUCOSE 94 11/22/2012 1025   BUN 13 06/26/2023 0912   BUN 13.2 12/22/2016 1141   CREATININE 0.69 06/26/2023 0912   CREATININE 0.7 12/22/2016 1141   CALCIUM  10.0 06/26/2023 0912   CALCIUM  9.7 12/22/2016 1141   PROT 7.3 06/26/2023 0912   PROT 7.0 12/22/2016 1141   ALBUMIN 3.7 10/11/2021 1130   ALBUMIN 3.8 12/22/2016 1141   AST 14 06/26/2023 0912   AST 34 09/22/2020 1309   AST 18 12/22/2016 1141   ALT 8 06/26/2023 0912   ALT 82 (H) 09/22/2020 1309   ALT 10 12/22/2016 1141   ALKPHOS 89 10/11/2021 1130   ALKPHOS 81 12/22/2016 1141   BILITOT 0.5 06/26/2023 0912   BILITOT 0.3 09/22/2020 1309   BILITOT 0.65 12/22/2016 1141   GFRNONAA >60 12/02/2022 1215   GFRNONAA >60 09/22/2020 1309   GFRNONAA 93 04/16/2020 1225   GFRAA 108 04/16/2020 1225    Lab Results  Component Value Date   WBC 4.5 06/26/2023   HGB 12.0 06/26/2023   HCT 37.1 06/26/2023   MCV 91.6 06/26/2023   PLT 340 06/26/2023   Lab Results  Component Value Date   CHOL 202 (H) 06/26/2023   HDL 65 06/26/2023   LDLCALC 120 (H) 06/26/2023   TRIG 75 06/26/2023   CHOLHDL 3.1 06/26/2023   Lab Results  Component Value Date   TSH 2.85 06/26/2023    Assessment & Plan:   Meds ordered this encounter  Medications   tobramycin -dexamethasone  (TOBRADEX ) ophthalmic solution    Sig: Instill 2 drops in right eye 4 times a day for 5 days    Dispense:  5 mL    Refill:  0   Acute Bilateral Bacterial Conjunctivitis: Itching  began in May and since onset will occur at the left-side corner of the right eye, occasionally extending over the lid, for which she has tried Zaditor without resolution. Denies redness or pain/discomfort, but does say it  is aggravating and itching causes some irritation; on exam right eye is more injected than left eye but otherwise no abnormalities noted. Sending in Tobradex  eye drops to use 2 drops in right eye 4x daily for 5 days. Additionally, recommended reducing make-up usage while itching is ongoing.   History of Lung Cancer with annual CXRs, most recently 01/16/2024 showing new tree-in-bud micronodular disease in the lateral left lower lobe, and new ground-glass intermixed with micronodular disease medially in the left lower lobe superior segment, which was discussed. Continue ID follow up as well as Pulmonary follow up.    I,Emily Lagle,acting as a Neurosurgeon for Ronal JINNY Hailstone, MD.,have documented all relevant documentation on the behalf of Ronal JINNY Hailstone, MD,as directed by  Ronal JINNY Hailstone, MD while in the presence of Ronal JINNY Hailstone, MD.   I, Ronal JINNY Hailstone, MD, have reviewed all documentation for this visit. The documentation on 01/30/24 for the exam, diagnosis, procedures, and orders are all accurate and complete.

## 2024-01-28 NOTE — Progress Notes (Unsigned)
 Chief complaint: follow-up for Mycobacterial infection  Subjective:    Patient ID: Anna Valentine, female    DOB: December 12, 1950, 73 y.o.   MRN: [id number]  HPI  Anna Valentine is a 73 year old woman with chronic M avium pulmonary infection, previously followed by my partner Dr. Norleen Shams who has retired this April of 2024  Dr. Isidoro recounts her history below:  She has a remote history of lung cancer requiring right lower lobectomy and bronchiectasis. She was diagnosed with Mycobacterium avium pneumonia and started on azithromycin , ethambutol  and rifampin  in 2021.  She was intolerant of rifampin  with severe GI distress and was briefly hospitalized.  She improved with stopping antibiotic therapy at that time.     She started on a salvage regimen of azithromycin , ethambutol  and clofazimine  in January 2022.  She had significant symptomatic improvement.  A CT scan this past July showed stable abnormalities with no progression of her pulmonary findings.  However, she remained culture positive and started to have hearing loss bilaterally, presumed related to azithromycin .  She also started having worsening diarrhea.  I had her stop taking her antibiotics on 04/21/2022.  Her diarrhea resolved promptly.     In January she noted gradually worsening DOE, then developed a cough that was mostly dry but occasionally productive. She did not have any documented fevers but felt cold and was having mild night sweats. Her appetite worsened and she started to lose weight gradually.   She met with Dr. Selinda Chester at [facility] on 08/30/2022.  He recommended that she restart azithromycin  at a lower dose of 250 mg daily and to also restart ethambutol  for her chronic, smoldering Mycobacterium avium pneumonia that has recently flared up again.   He recommended holding off on restarting clofazimine  for now since it did not help her become culture negative previously.  He did say that if she has a  suboptimal response to azithromycin  and ethambutol  to consider adding aerosolized amikacin.     She feels like she is tolerating her antibiotics well.  After her last visit here she decided to restart clofazimine  on her own.  She no longer notes the muffled feeling with her hearing and has not experienced any further hearing loss that she is aware of.  I did receive a note from her audiologist who noted that there have been slight improvement in air conduction thresholds shortly before restarting antibiotics.  Her cough has resolved and she is unable to produce any sputum for repeat culture.  She has not had any further fever or night sweats.  She remains fatigued but says that her fatigue is slightly better.  Her nausea has improved and she is not requiring as much Phenergan .  She did not feel like Zofran  was as effective.  She says that she is eating okay but is still losing weight.  She is not taking any supplements.  Her biggest concern is worsening dyspnea on exertion.  She says that she is very dejected.  A friend had to go with her to the grocery store because she is unable to shop for groceries without sitting down.  She monitors her oxygen saturation and says that the lowest it goes is about 93% but she notes that her heart rate jumps up to around 115/min when walking.  Several years ago she tried pulmonary rehab but had to quit after she developed a spinal compression fracture.   A repeat chest CT scan on 09/30/2022 revealed:   IMPRESSION:  1. New consolidation and possible associated necrosis in the right lower lobe, indicative of pneumonia. 2. New peribronchovascular nodularity and ground-glass in the left lower lobe, likely due to endobronchial spread of infection. 3. Extensive cylindrical bronchiectasis bilaterally. 4. Aortic atherosclerosis (ICD10-I70.0). Coronary artery calcification.   I saw her in June of 2024. At that time she was continuing her low-dose azithromycin  and  clofazimine  and ethambutol .  Since I saw her last she had repeat CT scan in July 2024 showing    IMPRESSION: 1. Chronic volume loss in the right lung with varicoid bronchiectasis and a large irregular right upper lobe bulla. There is only a very small amount of functional lung on the right, primarily in the right upper lobe, similar to previous. 2. Chronic scarring and nodularity along with bronchiectasis and airway plugging in the lingula. 3. Peripheral tree-in-bud nodularity in the left lower lobe compatible with atypical infectious bronchiolitis, pattern similar to previous. 4. Stable small pericardial effusion. 5. Coronary, aortic arch, and branch vessel atherosclerotic vascular disease. 6. Remote compression fractures at T4, T6, and T7 unchanged from previous. Mild chronic superior endplate compression fracture at T12. 7. Bilateral mastectomy and prior right axillary dissection.  She is apprehensive about a surgical intervention for this right lung.  She is had stable weight and cough is been fairly stable she does not really have much of a cough most of the time except when she sometimes has paroxysms that improve with chewing gum and drinking water she has had chills at nights and at one point in time some chattering of her teeth but these have been intermittent and not accompanied by other symptoms.  She has had worsening visual acuity that she thinks could be due to cataracts but she is worried about ethambutol  she is scheduled to see optometry in late February but is seen Dr. Elner with ophthalmology   Discussed the use of AI scribe software for clinical note transcription with the patient, who gave verbal consent to proceed.  History of Present Illness   Anna Valentine is a 73 year old female with bronchiectasis and MAC who presents with concerns about shortness of breath and recent CT scan findings.  She experiences ongoing shortness of breath, variable in intensity,  with significant difficulty during minimal exertion on some days. Oxygen levels remain stable during these episodes. Cardiac function is normal. Exacerbations are sometimes linked to inactivity.  She has MAC and bronchiectasis, with previous right lower lobe surgery. Further surgery to protect the left lung has been discussed, but she is hesitant. She is currently on clofazimine , ethambutol  and azithromycin   Occasional nausea is managed effectively with Phenergan . Past eye irritation resolved after discontinuing makeup use, initially thought to be related to Ethambutol .   She did have some new lung nodules show up on CT of the left lower lobe but I would NOT change her antibiotics because of this. This is not an infrequent finding in our patients with M avium. Clinically she is doing great with almost no cough, no weight loss, malaise.   The visual problems she was worried about before turned out to  have been due to make-up that was irritating her eyes.          Past Medical History:  Diagnosis Date   Anemia    hx of IDA   BRCA negative 2011   BRCA I/ II negative   Breast cancer (HCC) 08/2009   LEFT=stage 2, rx with lumpectomy and xrt   Breast cancer (HCC)  2019   RIGHT   Bronchiectasis (HCC)    Chronic diastolic CHF (congestive heart failure) (HCC) 02/20/2020   Constipation    Essential hypertension 02/20/2020   Family history of adverse reaction to anesthesia    My Sister has nausea   Family history of breast cancer    Family history of colon cancer    Family history of lung cancer    GERD (gastroesophageal reflux disease)    not presently having symptoms   Hypertension    Iatrogenic pneumothorax 02/06/2019   Lung cancer (HCC) 06/06/2005   stage 1 poorly differentiated adenocarcinoma, s/p right lower lobectomy.   Lung cancer (HCC) 12/26/2022   Mycobacterium avium complex (HCC)    dx 2021   Osteopenia 09/13/2013   Osteoporosis    Personal history of lung cancer     Pneumonia    2007ish, walking pnemonia - 2009 ish   S/P lobectomy of lung 06/26/2023   STD (sexually transmitted disease)    HSV   Transaminitis 12/26/2022   Vitamin D  deficiency 09/13/2013    Past Surgical History:  Procedure Laterality Date   BREAST BIOPSY     BREAST LUMPECTOMY  10/12/2009   Left lumpectomy and radiation, stage II, ER/PR+, Her 2 nu negative   BUNIONECTOMY Bilateral    COLONOSCOPY  2018   SA-MAC-suprep-no polyps   COLONOSCOPY     LOBECTOMY Right 06/06/2005   [facility]   MASTECTOMY W/ SENTINEL NODE BIOPSY Bilateral 03/15/2018   MASTECTOMY W/ SENTINEL NODE BIOPSY Bilateral 03/15/2018   Procedure: BILATERAL TOTAL MASTECTOMIES WITH RIGHT SENTINEL LYMPH NODE BIOPSY;  Surgeon: Ebbie Cough, MD;  Location: [facility] OR;  Service: General;  Laterality: Bilateral;   PORTACATH PLACEMENT N/A 03/15/2018   Procedure: INSERTION PORT-A-CATH WITH US ;  Surgeon: Ebbie Cough, MD;  Location: [facility] OR;  Service: General;  Laterality: N/A;   TONSILLECTOMY     TUBAL LIGATION  1984   VIDEO BRONCHOSCOPY  02/06/2019   Flexible video fiberoptic bronchoscopy with electromagnetic navigation and biopsies.   VIDEO BRONCHOSCOPY WITH ENDOBRONCHIAL NAVIGATION Left 02/06/2019   Procedure: VIDEO BRONCHOSCOPY WITH ENDOBRONCHIAL NAVIGATION, left lung;  Surgeon: Shelah Lamar RAMAN, MD;  Location: [facility] OR;  Service: Thoracic;  Laterality: Left;   VIDEO BRONCHOSCOPY WITH ENDOBRONCHIAL NAVIGATION N/A 01/08/2020   Procedure: VIDEO BRONCHOSCOPY WITH ENDOBRONCHIAL NAVIGATION;  Surgeon: Shelah Lamar RAMAN, MD;  Location: [facility] OR;  Service: Thoracic;  Laterality: N/A;    Family History  Problem Relation Age of Onset   Allergies Mother    Asthma Mother    Lung cancer Mother    Breast cancer Mother 26       recurrence age 58   Colon cancer Father 88   Colon polyps Father 65   Prostate cancer Brother 52   Breast cancer Sister 79       Recurrence age 69 BRCA negative   Colon polyps Sister    Leukemia Sister    Breast  cancer Sister 13   Prostate cancer Brother 66   Breast cancer Maternal Grandmother 88   Colon cancer Maternal Aunt    Leukemia Maternal Grandfather    Lung cancer Maternal Aunt    Breast cancer Cousin    Esophageal cancer Neg Hx    Rectal cancer Neg Hx    Stomach cancer Neg Hx       Social History   Socioeconomic History   Marital status: Single    Spouse name: Not on file   Number of children: 0   Years of  education: Not on file   Highest education level: Bachelor's degree (e.g., BA, AB, BS)  Occupational History   Occupation: OFFICE MANAGER    Employer: [employer]  Tobacco Use   Smoking status: Former    Current packs/day: 0.00    Average packs/day: 2.0 packs/day for 18.0 years (36.0 ttl pk-yrs)    Types: Cigarettes    Start date: 07/11/1969    Quit date: 07/12/1987    Years since quitting: 36.5   Smokeless tobacco: Never  Vaping Use   Vaping status: Never Used  Substance and Sexual Activity   Alcohol use: Yes    Comment: occasionally   Drug use: No   Sexual activity: Yes  Other Topics Concern   Not on file  Social History Narrative   Not on file   Social Drivers of Health   Financial Resource Strain: Low Risk  (06/26/2023)   Overall Financial Resource Strain (CARDIA)    Difficulty of Paying Living Expenses: Not hard at all  Food Insecurity: No Food Insecurity (06/26/2023)   Hunger Vital Sign    Worried About Running Out of Food in the Last Year: Never true    Ran Out of Food in the Last Year: Never true  Transportation Needs: No Transportation Needs (06/26/2023)   PRAPARE - Administrator, Civil Service (Medical): No    Lack of Transportation (Non-Medical): No  Physical Activity: Inactive (06/29/2023)   Exercise Vital Sign    Days of Exercise per Week: 0 days    Minutes of Exercise per Session: 0 min  Stress: No Stress Concern Present (06/26/2023)   [employer] of Occupational Health - Occupational Stress Questionnaire    Feeling of  Stress : Not at all  Social Connections: Moderately Integrated (06/26/2023)   Social Connection and Isolation Panel    Frequency of Communication with Friends and Family: More than three times a week    Frequency of Social Gatherings with Friends and Family: More than three times a week    Attends Religious Services: More than 4 times per year    Active Member of [employer] or Organizations: Yes    Attends Banker Meetings: Not on file    Marital Status: Widowed    Allergies  Allergen Reactions   Rifampin  Nausea And Vomiting   Taxol  [Paclitaxel ] Anaphylaxis   Paclitaxel  (Protein-Bound) Hives   Tape Itching   Abraxane  [Paclitaxel  Protein-Bound Part] Rash   Clarithromycin Rash     Current Outpatient Medications:    acyclovir  (ZOVIRAX ) 400 MG tablet, TAKE 1 TABLET TWICE DAILY, Disp: 180 tablet, Rfl: 3   albuterol  (VENTOLIN  HFA) 108 (90 Base) MCG/ACT inhaler, INHALE 2 PUFFS INTO LUNGS EVERY 6 HOURS AS NEEDED FOR WHEEZING/SHORTNESS OF BREATH, Disp: 8.5 each, Rfl: 12   Calcium  Carbonate-Vit D-Min (CALCIUM  1200 PO), Take by mouth daily., Disp: , Rfl:    Cholecalciferol  (D3 2000 PO), Take by mouth., Disp: , Rfl:    clofazimine  50 mg CAPS capsule (for compassionate use), Take 2 capsules (100 mg total) by mouth daily with breakfast., Disp: 100 capsule, Rfl: 1   ethambutol  (MYAMBUTOL ) 400 MG tablet, Take 2 tablets (800 mg total) by mouth daily. 800 mg, Disp: 60 tablet, Rfl: 5   promethazine  (PHENERGAN ) 12.5 MG tablet, Take 1 tablet (12.5 mg total) by mouth every 6 (six) hours as needed for nausea or vomiting., Disp: 30 tablet, Rfl: 11   rosuvastatin  (CRESTOR ) 10 MG tablet, Take 1 tablet (10 mg total) by mouth daily., Disp:  30 tablet, Rfl: 11   tobramycin -dexamethasone  (TOBRADEX ) ophthalmic solution, Instill 2 drops in right eye 4 times a day for 5 days, Disp: 5 mL, Rfl: 0   vitamin C  (ASCORBIC ACID ) 500 MG tablet, Take 500 mg by mouth daily., Disp: , Rfl:   Current  Facility-Administered Medications:    [START ON 07/11/2024] denosumab  (PROLIA ) injection 60 mg, 60 mg, Subcutaneous, Once, Shamleffer, Ibtehal Jaralla, MD    Review of Systems  Constitutional:  Negative for activity change, appetite change, chills, diaphoresis, fatigue, fever and unexpected weight change.  HENT:  Negative for congestion, rhinorrhea, sinus pressure, sneezing, sore throat and trouble swallowing.   Eyes:  Negative for photophobia and visual disturbance.  Respiratory:  Positive for shortness of breath. Negative for cough, chest tightness, wheezing and stridor.   Cardiovascular:  Negative for chest pain, palpitations and leg swelling.  Gastrointestinal:  Negative for abdominal distention, abdominal pain, anal bleeding, blood in stool, constipation, diarrhea, nausea and vomiting.  Genitourinary:  Negative for difficulty urinating, dysuria, flank pain and hematuria.  Musculoskeletal:  Negative for arthralgias, back pain, gait problem, joint swelling and myalgias.  Skin:  Negative for color change, pallor, rash and wound.  Neurological:  Negative for dizziness, tremors, weakness and light-headedness.  Hematological:  Negative for adenopathy. Does not bruise/bleed easily.  Psychiatric/Behavioral:  Negative for agitation, behavioral problems, confusion, decreased concentration, dysphoric mood and sleep disturbance.        Objective:   Physical Exam Constitutional:      General: She is not in acute distress.    Appearance: Normal appearance. She is well-developed. She is not ill-appearing or diaphoretic.  HENT:     Head: Normocephalic and atraumatic.     Right Ear: Hearing and external ear normal.     Left Ear: Hearing and external ear normal.     Nose: No nasal deformity or rhinorrhea.  Eyes:     General: No scleral icterus.    Conjunctiva/sclera: Conjunctivae normal.     Right eye: Right conjunctiva is not injected.     Left eye: Left conjunctiva is not injected.     Pupils:  Pupils are equal, round, and reactive to light.  Neck:     Vascular: No JVD.  Cardiovascular:     Rate and Rhythm: Normal rate and regular rhythm.     Heart sounds: S1 normal and S2 normal. No murmur heard.    No friction rub. No gallop.  Pulmonary:     Effort: Pulmonary effort is normal. Prolonged expiration present. No respiratory distress.     Breath sounds: No stridor. No rhonchi.  Abdominal:     General: Bowel sounds are normal. There is no distension.     Palpations: Abdomen is soft.     Tenderness: There is no abdominal tenderness.  Musculoskeletal:        General: Normal range of motion.     Right shoulder: Normal.     Left shoulder: Normal.     Cervical back: Normal range of motion and neck supple.     Right hip: Normal.     Left hip: Normal.     Right knee: Normal.     Left knee: Normal.  Lymphadenopathy:     Head:     Right side of head: No submandibular, preauricular or posterior auricular adenopathy.     Left side of head: No submandibular, preauricular or posterior auricular adenopathy.     Cervical: No cervical adenopathy.     Right cervical: No superficial  or deep cervical adenopathy.    Left cervical: No superficial or deep cervical adenopathy.  Skin:    General: Skin is warm and dry.     Coloration: Skin is not pale.     Findings: No abrasion, bruising, ecchymosis, erythema, lesion or rash.     Nails: There is no clubbing.  Neurological:     Mental Status: She is alert and oriented to person, place, and time.     Sensory: No sensory deficit.     Coordination: Coordination normal.     Gait: Gait normal.  Psychiatric:        Attention and Perception: She is attentive.        Mood and Affect: Mood normal.        Speech: Speech normal.        Behavior: Behavior normal. Behavior is cooperative.        Thought Content: Thought content normal.        Judgment: Judgment normal.           Assessment & Plan:   Assessment and Plan    MAC infection with  bronchiectasis CT scan changes consistent with MAC treatment. No new symptoms indicating worsening infection. - Continue current treatment regimen  clofazamine (samples given) azithromycin  and ethambutol   Lung nodules New CT changes typical in MAC treatment. No immediate intervention required unless symptoms change.  Shortness of breath Intermittent dyspnea likely related to bronchiectasis and MAC infection. No cardiac etiology identified per patient - Monitor dyspnea and report significant changes.  Breast cancer Ongoing follow-up with oncology and she is doing great - Continue regular oncology follow-up.  Nausea Occasional nausea managed with Phenergan . - Prescribe Phenergan  for nausea management.  COVID-19 Vaccination Over 65 with lung disease, qualifying for another COVID-19 vaccine. - Recommend receiving another COVID-19 vaccine.      Nausea: phenergan  rx

## 2024-01-29 ENCOUNTER — Encounter: Payer: Self-pay | Admitting: Infectious Disease

## 2024-01-29 ENCOUNTER — Encounter: Payer: Self-pay | Admitting: Oncology

## 2024-01-29 ENCOUNTER — Telehealth: Payer: Self-pay | Admitting: Pharmacist

## 2024-01-29 ENCOUNTER — Other Ambulatory Visit: Payer: Self-pay

## 2024-01-29 ENCOUNTER — Other Ambulatory Visit (HOSPITAL_COMMUNITY): Payer: Self-pay

## 2024-01-29 ENCOUNTER — Ambulatory Visit (INDEPENDENT_AMBULATORY_CARE_PROVIDER_SITE_OTHER): Admitting: Infectious Disease

## 2024-01-29 VITALS — BP 125/78 | HR 90 | Temp 96.8°F | Ht 66.0 in | Wt 104.0 lb

## 2024-01-29 DIAGNOSIS — R110 Nausea: Secondary | ICD-10-CM | POA: Diagnosis not present

## 2024-01-29 DIAGNOSIS — A310 Pulmonary mycobacterial infection: Secondary | ICD-10-CM | POA: Diagnosis not present

## 2024-01-29 DIAGNOSIS — Z170 Estrogen receptor positive status [ER+]: Secondary | ICD-10-CM

## 2024-01-29 DIAGNOSIS — C3490 Malignant neoplasm of unspecified part of unspecified bronchus or lung: Secondary | ICD-10-CM | POA: Diagnosis not present

## 2024-01-29 DIAGNOSIS — Z7185 Encounter for immunization safety counseling: Secondary | ICD-10-CM | POA: Diagnosis not present

## 2024-01-29 DIAGNOSIS — R0609 Other forms of dyspnea: Secondary | ICD-10-CM

## 2024-01-29 DIAGNOSIS — C50212 Malignant neoplasm of upper-inner quadrant of left female breast: Secondary | ICD-10-CM | POA: Diagnosis not present

## 2024-01-29 MED ORDER — PROMETHAZINE HCL 12.5 MG PO TABS
12.5000 mg | ORAL_TABLET | Freq: Four times a day (QID) | ORAL | 11 refills | Status: AC | PRN
Start: 2024-01-29 — End: ?

## 2024-01-29 NOTE — Telephone Encounter (Signed)
 Patient picked up 2 bottles of clofazimine  today. Supply should last for ~3 months. Next refill due around the end of October.  Jden Want L. Tranell Wojtkiewicz, PharmD, BCIDP, AAHIVP, CPP Clinical Pharmacist Practitioner Infectious Diseases Clinical Pharmacist [facility] 01/29/2024, 11:14 AM

## 2024-01-30 ENCOUNTER — Encounter: Payer: Self-pay | Admitting: Internal Medicine

## 2024-01-30 ENCOUNTER — Telehealth: Payer: Self-pay

## 2024-01-30 NOTE — Patient Instructions (Addendum)
 It was a pleasure to see you today. You have conjunctivitis also known as pink eye. Please use Tobradex  eye drops 4 times a day for 5 days.

## 2024-01-30 NOTE — Telephone Encounter (Signed)
 PA for promethazine  submitted via covermymeds. Awaiting response.   Key: [id number]  Reynard Christoffersen, BSN, RN

## 2024-01-30 NOTE — Telephone Encounter (Signed)
 PA approved, [facility] notified.   Chaslyn Eisen, BSN, RN

## 2024-02-02 ENCOUNTER — Ambulatory Visit: Admitting: Primary Care

## 2024-02-02 ENCOUNTER — Encounter: Payer: Self-pay | Admitting: Primary Care

## 2024-02-02 VITALS — BP 128/75 | HR 87 | Temp 97.8°F | Ht 66.0 in | Wt 102.6 lb

## 2024-02-02 DIAGNOSIS — A310 Pulmonary mycobacterial infection: Secondary | ICD-10-CM | POA: Diagnosis not present

## 2024-02-02 DIAGNOSIS — R053 Chronic cough: Secondary | ICD-10-CM | POA: Diagnosis not present

## 2024-02-02 NOTE — Progress Notes (Signed)
 @Patient  ID: Anna Valentine, female    DOB: 1951-06-13, 73 y.o.   MRN: [id number]  No chief complaint on file.   Referring provider: Perri Ronal PARAS, MD  HPI: 72 year old female, former smoker quit in 1989. PMH significant for CHF, HTN, bronchiectasis, lung cancer, MAC, osteopenia, breast cancer, iron deficiency anemia.   02/02/2024- interim hx  Discussed the use of AI scribe software for clinical note transcription with the patient, who gave verbal consent to proceed.  History of Present Illness   Anna Valentine is a 73 year old female with atypical lung infection (MAC) who presents for follow-up.  She has been managing an atypical lung infection, specifically Mycobacterium avium complex (MAC), since 2020 or 2021. Initially, she started treatment with azithromycin  500 mg but had to discontinue due to side effects including hearing loss and chronic diarrhea. After a 3-4 month break, her symptoms returned, prompting a resumption of treatment at a reduced dose of 250 mg. Recent imaging showed a new area of tree-in-bud micronodularity in the left lower lobe. She experiences ongoing shortness of breath, which she manages by resting on bad days, noting improvement the following day. She monitors her oxygen levels, which remain stable.  She uses albuterol  every morning, which she feels may help with expectoration, especially when combined with the heat from a shower. She does not use Mucinex or other expectorants regularly. She has a flutter valve from a previous hospital stay but has not used it at home.  Her past medical history includes aortic atherosclerosis, for which she is on cholesterol medication, and osteopenia. She is on Prolia  and plans to have a bone density test in August. Her mother also has osteoporosis.  She acknowledges a decrease in physical activity, noting a past as an avid [employer] and runner.  No daily congestion, reports occasional coughing spells. Oxygen levels  remain stable despite shortness of breath.      Allergies  Allergen Reactions   Rifampin  Nausea And Vomiting   Taxol  [Paclitaxel ] Anaphylaxis   Paclitaxel  (Protein-Bound) Hives   Tape Itching   Abraxane  [Paclitaxel  Protein-Bound Part] Rash   Clarithromycin Rash    Immunization History  Administered Date(s) Administered   Fluad Quad(high Dose 65+) 04/07/2021, 04/14/2022   Influenza Split 04/13/2011, 05/02/2012   Influenza, High Dose Seasonal PF 05/03/2023   Influenza,inj,Quad PF,6+ Mos 04/10/2013, 04/23/2014, 05/06/2015, 04/08/2016, 04/21/2017, 04/20/2018, 04/19/2019, 04/17/2020   Influenza-Unspecified 04/10/2013, 04/23/2014, 05/06/2015, 04/08/2016, 04/21/2017   PFIZER(Purple Top)SARS-COV-2 Vaccination 08/24/2019, 09/18/2019, 05/25/2020, 02/04/2021   PNEUMOCOCCAL CONJUGATE-20 07/15/2022   Pfizer Covid-19 Vaccine Bivalent Booster 54yrs & up 06/16/2021   Pfizer(Comirnaty)Fall Seasonal Vaccine 12 years and older 05/19/2022, 06/27/2023   Pneumococcal Conjugate-13 09/07/2018   Pneumococcal Polysaccharide-23 06/14/2017   Tdap 04/07/2003, 05/14/2013   Zoster, Live 12/19/2013    Past Medical History:  Diagnosis Date   Anemia    hx of IDA   BRCA negative 2011   BRCA I/ II negative   Breast cancer (HCC) 08/2009   LEFT=stage 2, rx with lumpectomy and xrt   Breast cancer (HCC) 2019   RIGHT   Bronchiectasis (HCC)    Chronic diastolic CHF (congestive heart failure) (HCC) 02/20/2020   Constipation    Essential hypertension 02/20/2020   Family history of adverse reaction to anesthesia    My Sister has nausea   Family history of breast cancer    Family history of colon cancer    Family history of lung cancer    GERD (gastroesophageal reflux disease)  not presently having symptoms   Hypertension    Iatrogenic pneumothorax 02/06/2019   Lung cancer (HCC) 06/06/2005   stage 1 poorly differentiated adenocarcinoma, s/p right lower lobectomy.   Lung cancer (HCC) 12/26/2022    Mycobacterium avium complex (HCC)    dx 2021   Osteopenia 09/13/2013   Osteoporosis    Personal history of lung cancer    Pneumonia    2007ish, walking pnemonia - 2009 ish   S/P lobectomy of lung 06/26/2023   STD (sexually transmitted disease)    HSV   Transaminitis 12/26/2022   Vitamin D  deficiency 09/13/2013    Tobacco History: Social History   Tobacco Use  Smoking Status Former   Current packs/day: 0.00   Average packs/day: 2.0 packs/day for 18.0 years (36.0 ttl pk-yrs)   Types: Cigarettes   Start date: 07/11/1969   Quit date: 07/12/1987   Years since quitting: 36.5  Smokeless Tobacco Never   Counseling given: Not Answered   Outpatient Medications Prior to Visit  Medication Sig Dispense Refill   acyclovir  (ZOVIRAX ) 400 MG tablet TAKE 1 TABLET TWICE DAILY 180 tablet 3   albuterol  (VENTOLIN  HFA) 108 (90 Base) MCG/ACT inhaler INHALE 2 PUFFS INTO LUNGS EVERY 6 HOURS AS NEEDED FOR WHEEZING/SHORTNESS OF BREATH 8.5 each 12   azithromycin  (ZITHROMAX ) 250 MG tablet Take 250 mg by mouth daily.     Calcium  Carbonate-Vit D-Min (CALCIUM  1200 PO) Take by mouth daily.     Cholecalciferol  (D3 2000 PO) Take by mouth.     clofazimine  50 mg CAPS capsule (for compassionate use) Take 2 capsules (100 mg total) by mouth daily with breakfast. 100 capsule 1   ethambutol  (MYAMBUTOL ) 400 MG tablet Take 2 tablets (800 mg total) by mouth daily. 800 mg 60 tablet 5   promethazine  (PHENERGAN ) 12.5 MG tablet Take 1 tablet (12.5 mg total) by mouth every 6 (six) hours as needed for nausea or vomiting. 30 tablet 11   rosuvastatin  (CRESTOR ) 10 MG tablet Take 1 tablet (10 mg total) by mouth daily. 30 tablet 11   tobramycin -dexamethasone  (TOBRADEX ) ophthalmic solution Instill 2 drops in right eye 4 times a day for 5 days 5 mL 0   vitamin C  (ASCORBIC ACID ) 500 MG tablet Take 500 mg by mouth daily.     Facility-Administered Medications Prior to Visit  Medication Dose Route Frequency Provider Last Rate Last Admin    [START ON 07/11/2024] denosumab  (PROLIA ) injection 60 mg  60 mg Subcutaneous Once Shamleffer, Ibtehal Jaralla, MD          Review of Systems  Review of Systems  Constitutional: Negative.   HENT: Negative.    Respiratory:  Positive for cough. Negative for chest tightness and wheezing.        DOE  Cardiovascular: Negative.    Physical Exam  There were no vitals taken for this visit. Physical Exam Constitutional:      General: She is not in acute distress.    Appearance: Normal appearance. She is not ill-appearing.  HENT:     Head: Normocephalic and atraumatic.  Cardiovascular:     Rate and Rhythm: Normal rate and regular rhythm.  Pulmonary:     Effort: Pulmonary effort is normal.     Breath sounds: Normal breath sounds.     Comments: Distant rhonchi right middle lobe, left lung clear to auscultation  Skin:    General: Skin is warm and dry.  Neurological:     General: No focal deficit present.     Mental  Status: She is alert and oriented to person, place, and time. Mental status is at baseline.  Psychiatric:        Mood and Affect: Mood normal.        Behavior: Behavior normal.        Thought Content: Thought content normal.        Judgment: Judgment normal.      Lab Results:  CBC    Component Value Date/Time   WBC 4.5 06/26/2023 0912   RBC 4.05 06/26/2023 0912   HGB 12.0 06/26/2023 0912   HGB 11.4 (L) 11/02/2021 1445   HGB 11.9 12/22/2016 1141   HCT 37.1 06/26/2023 0912   HCT 35.8 12/22/2016 1141   PLT 340 06/26/2023 0912   PLT 235 11/02/2021 1445   PLT 275 12/22/2016 1141   MCV 91.6 06/26/2023 0912   MCV 93.2 12/22/2016 1141   MCH 29.6 06/26/2023 0912   MCHC 32.3 06/26/2023 0912   RDW 12.6 06/26/2023 0912   RDW 13.0 12/22/2016 1141   LYMPHSABS 573 (L) 12/26/2022 1052   LYMPHSABS 0.8 (L) 12/22/2016 1141   MONOABS 0.5 11/02/2021 1445   MONOABS 0.5 12/22/2016 1141   EOSABS 41 06/26/2023 0912   EOSABS 0.1 12/22/2016 1141   EOSABS 0.1 02/03/2010 1007    BASOSABS 18 06/26/2023 0912   BASOSABS 0.0 12/22/2016 1141    BMET    Component Value Date/Time   NA 138 06/26/2023 0912   NA 139 12/22/2016 1141   K 5.1 06/26/2023 0912   K 4.2 12/22/2016 1141   CL 101 06/26/2023 0912   CL 103 11/22/2012 1025   CO2 26 06/26/2023 0912   CO2 28 12/22/2016 1141   GLUCOSE 88 06/26/2023 0912   GLUCOSE 92 12/22/2016 1141   GLUCOSE 94 11/22/2012 1025   BUN 13 06/26/2023 0912   BUN 13.2 12/22/2016 1141   CREATININE 0.69 06/26/2023 0912   CREATININE 0.7 12/22/2016 1141   CALCIUM  10.0 06/26/2023 0912   CALCIUM  9.7 12/22/2016 1141   GFRNONAA >60 12/02/2022 1215   GFRNONAA >60 09/22/2020 1309   GFRNONAA 93 04/16/2020 1225   GFRAA 108 04/16/2020 1225    BNP    Component Value Date/Time   BNP 21.4 12/02/2022 1215    ProBNP No results found for: PROBNP  Imaging: CT Chest Wo Contrast Result Date: 01/16/2024 CLINICAL DATA:  Non-small cell lung cancer follow-up. Bronchiectasis. MAC infection. Previous lobectomy. EXAM: CT CHEST WITHOUT CONTRAST TECHNIQUE: Multidetector CT imaging of the chest was performed following the standard protocol without IV contrast. RADIATION DOSE REDUCTION: This exam was performed according to the departmental dose-optimization program which includes automated exposure control, adjustment of the mA and/or kV according to patient size and/or use of iterative reconstruction technique. COMPARISON:  Chest CTs without contrast 07/19/2023, 01/13/2023 and 09/30/2022 FINDINGS: Cardiovascular: The heart shifted to the right chest due to volume loss in the right thorax. Cardiac size is normal. The coronary arteries are heavily calcified. There is a tortuous aorta with atherosclerosis in the aorta and great vessels. The pulmonary arteries and veins are normal caliber. There is a small anterior pericardial effusion. Mediastinum/Nodes: There is a borderline prominent precarinal lymph node, 8 mm short axis on 2:50 which has been stable over prior  studies. No enlarged intrathoracic lymph nodes are seen without contrast, with limited assessment for hilar adenopathy. There are clips in the right axilla but no hilar adenopathy. Thyroid  gland is unremarkable. The trachea and main bronchi are clear. Negative thoracic esophagus. Lungs/Pleura: There are  old postsurgical changes of right lower lobectomy, chronic cystic necrotic change to the right lung with marked volume loss and severe varicoid bronchiectasis in the residual fibrotic tissue in the lower lung field. There are chronic calcifications studding the pleural surface on the right. Chronic cavitary disease in the right upper lung field above the carinal level. There is compensatory hyperinflation of the left lung. Subpleural XRT related scarring in the anterior aspect of the left upper lobe. There are scattered mucoid impactions and peribronchovascular nodularity in the left lung with new tree-in-bud micronodular disease in the lateral left lower lobe, and new ground-glass intermixed with micronodular disease medially in the left lower lobe superior segment. There are granulomatous calcifications in the lingular base, and unchanged cylindrical bronchiectasis in the left lung. Upper Abdomen: No acute abnormality. There is a 3.5 cm Bosniak 1 cyst in the superior pole of the left kidney. Chronic linear calcification right adrenal gland. Abdominal aortic atherosclerosis. Musculoskeletal: There is osteopenia, kyphosis and degenerative changes of the thoracic spine and multilevel chronic compression fractures. No regional skeletal fracture or metastasis is seen. IMPRESSION: 1. New tree-in-bud micronodular disease in the lateral left lower lobe, and new ground-glass intermixed with micronodular disease medially in the left lower lobe superior segment. Findings are consistent with atypical infection, possibly MAC. 2. Old postsurgical changes of right lower lobectomy with chronic cystic necrotic change to the right  lung with marked volume loss and severe varicoid bronchiectasis in the residual fibrotic tissue in the lower lung field. 3. Chronic cavitary disease in the right upper lung field above the carinal level. 4. Compensatory hyperinflation of the left lung with subpleural XRT related scarring in the anterior aspect of the left upper lobe. 5. Aortic and coronary artery atherosclerosis. 6. Small anterior pericardial effusion, stable. 7. Osteopenia, kyphosis and degenerative changes of the thoracic spine with multilevel chronic compression fractures. 8. Bosniak 1 cyst in the superior pole of the left kidney. Aortic Atherosclerosis (ICD10-I70.0). Electronically Signed   By: Francis Quam M.D.   On: 01/16/2024 04:20     Assessment & Plan:   1. Mycobacterium avium infection (HCC) (Primary) - CT Chest Wo Contrast; Future  2. Chronic cough - CT Chest Wo Contrast; Future   Assessment and Plan    Active Mycobacterium Avium Complex (MAC) infection Active MAC infection with new tree-in-bud micronodularity in the left lower lobe. The right lung serves as a repository for the infection. The condition is well-managed with no new symptoms. Imaging was reviewed by Dr. Shelah and felt to be stable. Lifelong suppression therapy is likely required. Current treatment includes azithromycin  250, ethambutol  and acyclovir . She is aware that the infection may not be fully eradicated and is concerned about potential progression to the left lung. - Continue routine surveillance, no changes to plan of care today  - Continue follow-up with ID and pulmonary   Shortness of breath Intermittent shortness of breath, manageable most days but occasionally severe. Oxygen levels remain stable. No daily congestion, but occasional coughing spells. Albuterol  provides some relief, especially with heat from showers. Pulmonary hygiene is recommended to aid in lung clearance. - Use albuterol  inhaler morning and evening. - Use Flutter valve  after albuterol  use, morning and evening. - Consider guaifenesin or Robitussin as needed for increased congestion. - Monitor oxygen levels regularly.  Aortic atherosclerosis - Continue current cholesterol medication.  Osteopenia Osteopenia with possible progression to osteoporosis. She is on calcium +vitamin D  supplements along with Prolia  and is due for a bone density test in  August. Weight-bearing exercises are recommended to improve bone density. She is open to incorporating exercises from a seated position. - Schedule bone density test in August. - Consider incorporating weight-bearing exercises using light weights from a seated position. - Explore online resources for exercise routines suitable for osteopenia.   Almarie LELON Ferrari, NP 02/02/2024

## 2024-02-02 NOTE — Patient Instructions (Addendum)
  VISIT SUMMARY: Today, you had a follow-up appointment to discuss your ongoing management of an atypical lung infection (MAC), shortness of breath, aortic atherosclerosis, and osteopenia. We reviewed your current symptoms, medications, and discussed plans for managing each condition moving forward.  YOUR PLAN: -ACTIVE MYCOBACTERIUM AVIUM COMPLEX (MAC) INFECTION: You have an active MAC infection, which is a type of lung infection caused by bacteria. Recent imaging showed a new area of concern in your left lung. We will continue your current treatment with azithromycin , ethambutol  and acycolvir. Lifelong suppression therapy may be necessary.  -SHORTNESS OF BREATH: You experience chronic shortness of breath, which is manageable most days. Your oxygen levels are stable. We recommend using your albuterol  inhaler in the morning and evening, and using the Flutter valve after each use to help clear your lungs. You may also use guaifenesin or Robitussin if you experience increased congestion. Continue to monitor your oxygen levels regularly.  -AORTIC ATHEROSCLEROSIS: Aortic atherosclerosis is a condition where the arteries become narrowed due to plaque buildup. Your condition is stable and managed with cholesterol medication. Continue taking your current medication as prescribed.  -OSTEOPENIA: Osteopenia is a condition where bone density is lower than normal, which can progress to osteoporosis. You are currently taking calcium  and vitamin D  supplements and are scheduled for a bone density test in August. We recommend incorporating weight-bearing exercises using light weights from a seated position to help improve bone density. You can explore online resources for suitable exercise routines. Check out [email or web address] and [employer] ([employer] [facility] [address]).   INSTRUCTIONS: Please continue with your current medications and follow the recommendations provided for each condition. Schedule your  bone density test in August. If you experience any new or worsening symptoms, contact our office.  Follow-up  6 months with Dr. Byrum

## 2024-02-22 ENCOUNTER — Other Ambulatory Visit: Payer: Self-pay | Admitting: Infectious Disease

## 2024-02-26 ENCOUNTER — Other Ambulatory Visit: Payer: Self-pay

## 2024-02-26 MED ORDER — ETHAMBUTOL HCL 400 MG PO TABS: 800.0000 mg | ORAL_TABLET | Freq: Every day | ORAL | 5 refills | Status: AC

## 2024-03-04 ENCOUNTER — Ambulatory Visit (INDEPENDENT_AMBULATORY_CARE_PROVIDER_SITE_OTHER): Payer: Medicare Other | Admitting: Internal Medicine

## 2024-03-04 ENCOUNTER — Encounter: Payer: Self-pay | Admitting: Internal Medicine

## 2024-03-04 VITALS — BP 104/66 | HR 87 | Ht 66.0 in | Wt 104.2 lb

## 2024-03-04 DIAGNOSIS — M810 Age-related osteoporosis without current pathological fracture: Secondary | ICD-10-CM | POA: Diagnosis not present

## 2024-03-04 DIAGNOSIS — E2839 Other primary ovarian failure: Secondary | ICD-10-CM | POA: Insufficient documentation

## 2024-03-04 DIAGNOSIS — M8080XS Other osteoporosis with current pathological fracture, unspecified site, sequela: Secondary | ICD-10-CM

## 2024-03-04 DIAGNOSIS — I7300 Raynaud's syndrome without gangrene: Secondary | ICD-10-CM | POA: Insufficient documentation

## 2024-03-04 LAB — BASIC METABOLIC PANEL WITH GFR
BUN: 13 mg/dL (ref 7–25)
CO2: 28 mmol/L (ref 20–32)
Calcium: 9.2 mg/dL (ref 8.6–10.4)
Chloride: 99 mmol/L (ref 98–110)
Creat: 0.75 mg/dL (ref 0.60–1.00)
Glucose, Bld: 87 mg/dL (ref 65–139)
Potassium: 4.2 mmol/L (ref 3.5–5.3)
Sodium: 135 mmol/L (ref 135–146)
eGFR: 85 mL/min/1.73m2 (ref >=60)

## 2024-03-04 LAB — VITAMIN D 25 HYDROXY (VIT D DEFICIENCY, FRACTURES): Vit D, 25-Hydroxy: 37 ng/mL (ref 30–100)

## 2024-03-04 NOTE — Progress Notes (Unsigned)
 Name: Anna Valentine  MRN/ DOB: [id number], 07-23-1950    Age/ Sex: 73 y.o., female    PCP: Perri Ronal PARAS, MD   Reason for Endocrinology Evaluation: Osteoporosis      Date of Initial Endocrinology Evaluation: 08/25/2021    HPI: Anna Valentine is a 73 y.o. female with a past medical history of dyslipidemia, bronchiectasis , Hx of PE and lung ca( in 2006) . Hx of Breast Ca . The patient presented for initial endocrinology clinic visit on 08/25/2021 for consultative assistance with her Osteoporosis .   Pt was diagnosed with osteoporosis: 2013 with a left total hip T-score of -2.5 . Repeat in 07/2021 - 4.0   Menarche at age : 15 Menopausal at age : 56 Fracture Hx: right finger fracture while tripping  Hx of HRT: no FH of osteoporosis or hip fracture: no Prior Hx of anti-estrogenic therapy : yes Prior Hx of anti-resorptive therapy : alendronate around 2010 . Had to be stopped due to  GERD.  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 is here for follow-up on osteoporosis.  Last Prolia  injection 01/09/2024  Patient continues to follow up with ID and pulmonary for nontuberculous mycobacterial lung disease She continues with SOB, rare cough  No glucocortoid  No heartburn  No recent falls  No constipation but has occasional diarrhea  No palpitations     Calcium  1200 mg daily Vitamin D3 3000 IU daily Prolia  60 mg SQ every 6 months    HISTORY:  Past Medical History:  Past Medical History:  Diagnosis Date   Anemia    hx of IDA   BRCA negative 2011   BRCA I/ II negative   Breast cancer (HCC) 08/2009   LEFT=stage 2, rx with lumpectomy and xrt   Breast cancer (HCC) 2019   RIGHT   Bronchiectasis (HCC)    Chronic diastolic CHF (congestive heart failure) (HCC) 02/20/2020   Constipation     Essential hypertension 02/20/2020   Family history of adverse reaction to anesthesia    My Sister has nausea   Family history of breast cancer    Family history of colon cancer    Family history of lung cancer    GERD (gastroesophageal reflux disease)    not presently having symptoms   Hypertension    Iatrogenic pneumothorax 02/06/2019   Lung cancer (HCC) 06/06/2005   stage 1 poorly differentiated adenocarcinoma, s/p right lower lobectomy.   Lung cancer (HCC) 12/26/2022   Mycobacterium avium complex (HCC)    dx 2021   Osteopenia 09/13/2013   Osteoporosis    Personal history of lung cancer    Pneumonia    2007ish, walking pnemonia - 2009 ish   S/P lobectomy of lung 06/26/2023   STD (sexually transmitted disease)    HSV   Transaminitis 12/26/2022   Vitamin D  deficiency 09/13/2013   Past Surgical History:  Past Surgical History:  Procedure Laterality Date   BREAST BIOPSY     BREAST LUMPECTOMY  10/12/2009   Left lumpectomy and radiation, stage II, ER/PR+, Her 2 nu negative   BUNIONECTOMY Bilateral    COLONOSCOPY  2018   SA-MAC-suprep-no polyps   COLONOSCOPY     LOBECTOMY Right 06/06/2005   [place]   MASTECTOMY W/ SENTINEL NODE BIOPSY  Bilateral 03/15/2018   MASTECTOMY W/ SENTINEL NODE BIOPSY Bilateral 03/15/2018   Procedure: BILATERAL TOTAL MASTECTOMIES WITH RIGHT SENTINEL LYMPH NODE BIOPSY;  Surgeon: Ebbie Cough, MD;  Location: [facility] OR;  Service: General;  Laterality: Bilateral;   PORTACATH PLACEMENT N/A 03/15/2018   Procedure: INSERTION PORT-A-CATH WITH US ;  Surgeon: Ebbie Cough, MD;  Location: [facility] OR;  Service: General;  Laterality: N/A;   TONSILLECTOMY     TUBAL LIGATION  1984   VIDEO BRONCHOSCOPY  02/06/2019   Flexible video fiberoptic bronchoscopy with electromagnetic navigation and biopsies.   VIDEO BRONCHOSCOPY WITH ENDOBRONCHIAL NAVIGATION Left 02/06/2019   Procedure: VIDEO BRONCHOSCOPY WITH ENDOBRONCHIAL NAVIGATION, left lung;  Surgeon: Shelah Lamar RAMAN, MD;   Location: [facility] OR;  Service: Thoracic;  Laterality: Left;   VIDEO BRONCHOSCOPY WITH ENDOBRONCHIAL NAVIGATION N/A 01/08/2020   Procedure: VIDEO BRONCHOSCOPY WITH ENDOBRONCHIAL NAVIGATION;  Surgeon: Shelah Lamar RAMAN, MD;  Location: [facility] OR;  Service: Thoracic;  Laterality: N/A;    Social History:  reports that she quit smoking about 36 years ago. Her smoking use included cigarettes. She started smoking about 54 years ago. She has a 36 pack-year smoking history. She has never used smokeless tobacco. She reports current alcohol use. She reports that she does not use drugs. Family History: family history includes Allergies in her mother; Asthma in her mother; Breast cancer in her cousin; Breast cancer (age of onset: 46) in her sister; Breast cancer (age of onset: 37) in her sister; Breast cancer (age of onset: 37) in her mother; Breast cancer (age of onset: 19) in her maternal grandmother; Colon cancer in her maternal aunt; Colon cancer (age of onset: 59) in her father; Colon polyps in her sister; Colon polyps (age of onset: 74) in her father; Leukemia in her maternal grandfather and sister; Lung cancer in her maternal aunt and mother; Prostate cancer (age of onset: 36) in her brother; Prostate cancer (age of onset: 7) in her brother.   HOME MEDICATIONS: Allergies as of 03/04/2024       Reactions   Rifampin  Nausea And Vomiting   Taxol  [paclitaxel ] Anaphylaxis   Paclitaxel  (protein-bound) Hives   Tape Itching   Abraxane  [paclitaxel  Protein-bound Part] Rash   Clarithromycin Rash        Medication List        Accurate as of March 04, 2024 11:08 AM. If you have any questions, ask your nurse or doctor.          acyclovir  400 MG tablet Commonly known as: ZOVIRAX  TAKE 1 TABLET TWICE DAILY   albuterol  108 (90 Base) MCG/ACT inhaler Commonly known as: VENTOLIN  HFA INHALE 2 PUFFS INTO LUNGS EVERY 6 HOURS AS NEEDED FOR WHEEZING/SHORTNESS OF BREATH   ascorbic acid  500 MG tablet Commonly known as:  VITAMIN C  Take 500 mg by mouth daily.   azithromycin  250 MG tablet Commonly known as: ZITHROMAX  TAKE 1 TABLET BY MOUTH EVERY DAY.   CALCIUM  1200 PO Take by mouth daily.   clofazimine  50 mg Caps capsule (for compassionate use) Take 2 capsules (100 mg total) by mouth daily with breakfast.   D3 2000 PO Take by mouth.   ethambutol  400 MG tablet Commonly known as: MYAMBUTOL  Take 2 tablets (800 mg total) by mouth daily. 800 mg   promethazine  12.5 MG tablet Commonly known as: PHENERGAN  Take 1 tablet (12.5 mg total) by mouth every 6 (six) hours as needed for nausea or vomiting.   rosuvastatin  10 MG tablet Commonly known as: Crestor  Take 1 tablet (10 mg  total) by mouth daily.          REVIEW OF SYSTEMS: A comprehensive ROS was conducted with the patient and is negative except as per HPI     OBJECTIVE:  VS: BP 104/66 (BP Location: Left Arm, Patient Position: Sitting, Cuff Size: Small)   Pulse 87   Ht 5' 6 (1.676 m)   Wt 104 lb 3.2 oz (47.3 kg)   SpO2 98%   BMI 16.82 kg/m    Wt Readings from Last 3 Encounters:  03/04/24 104 lb 3.2 oz (47.3 kg)  02/02/24 102 lb 9.6 oz (46.5 kg)  01/29/24 104 lb (47.2 kg)     EXAM: General: Pt appears well and is in NAD  Neck: General: Supple without adenopathy. Thyroid : Thyroid  size normal.  No goiter or nodules appreciated.   Lungs: Clear with good BS bilat   Heart: Auscultation: RRR.  Extremities:  BL LE: No pretibial edema  Patient has been noted with bilateral feet violaceous discoloration, blanching in nature Bilateral dorsalis pedis pulses intact  Mental Status: Judgment, insight: Intact Orientation: Oriented to time, place, and person Mood and affect: No depression, anxiety, or agitation     DATA REVIEWED:     Latest Reference Range & Units 03/04/24 11:33  Sodium 135 - 146 mmol/L 135  Potassium 3.5 - 5.3 mmol/L 4.2  Chloride 98 - 110 mmol/L 99  CO2 20 - 32 mmol/L 28  Glucose 65 - 139 mg/dL 87  BUN 7 - 25  mg/dL 13  Creatinine 9.39 - 8.99 mg/dL 9.24  Calcium  8.6 - 10.4 mg/dL 9.2  BUN/Creatinine Ratio 6 - 22 (calc) SEE NOTE:  eGFR > OR = 60 mL/min/1.26m2 85    Latest Reference Range & Units 03/04/24 11:33  Vitamin D , 25-Hydroxy 30 - 100 ng/mL 37     DXA 08/02/2021 at [facility]   ASSESSMENT: The BMD measured at [facility] Total Left is 0.503 g/cm2 with a T-score of -4.0. This patient is considered osteoporotic according to World Health Organization [facility]) criteria.   The can quality is good.   Site Region Measured Date Measured Age YA BMD Significant CHANGE T-score DualFemur Total Left 08/02/2021    69.9         -4.0    0.503 g/cm2   DualFemur Total Mean 08/02/2021    69.9         -4.0    0.504 g/cm2   AP Spine  L1-L4      08/02/2021    69.9         -3.5    0.766 g/cm2   ASSESSMENT/PLAN/RECOMMENDATIONS:   Osteoporosis :  -Tolerating Prolia  without any side effects -Emphasized the importance of calcium  and vitamin D  intake -Limited with exercise due to shortness of breath - Will schedule for DXA scan - BMP, vitamin D  normal  Medications : Continue calcium  1200 mg daily Continue Vitamin D3 3000 IU daily Continue Prolia  60 mg SQ every 6 months    2. Raynaud's Phenomenon :  - Patient asymptomatic - Refer to rheumatology as this is beyond the scope of endocrinology   Follow-up 1 year   Signed electronically by: Stefano Redgie Butts, MD  [facility] Endocrinology  [facility] [address] [place], [place] [address] Phone: [phone] FAX: [phone]   CC: Perri Ronal PARAS, MD [address] [place] [address] Phone: [phone] Fax: [phone]   Return to Endocrinology clinic as below: Future Appointments  Date Time Provider Department Center  06/27/2024  9:10 AM MJB-LAB MJB-MJB MJB  07/01/2024  2:00 PM Perri Ronal PARAS, MD MJB-MJB MJB  07/12/2024  1:30 PM LBPC-LBENDO NURSE [facility]-LBENDO None  07/17/2024  11:00 AM GI-315 CT 2 GI-315CT GI-315 [address] [facility]  07/31/2024 11:00 AM Fleeta Kathie Jomarie LOISE, MD RC[facility]-RCID RCID  11/04/2024 11:00 AM Causey, Morna Pickle, NP [facility] None

## 2024-03-05 ENCOUNTER — Ambulatory Visit: Payer: Self-pay | Admitting: Internal Medicine

## 2024-04-17 ENCOUNTER — Encounter: Payer: Self-pay | Admitting: Internal Medicine

## 2024-04-24 DIAGNOSIS — Z23 Encounter for immunization: Secondary | ICD-10-CM | POA: Diagnosis not present

## 2024-05-01 DIAGNOSIS — H2511 Age-related nuclear cataract, right eye: Secondary | ICD-10-CM | POA: Diagnosis not present

## 2024-05-01 DIAGNOSIS — H2512 Age-related nuclear cataract, left eye: Secondary | ICD-10-CM | POA: Diagnosis not present

## 2024-05-01 DIAGNOSIS — H43813 Vitreous degeneration, bilateral: Secondary | ICD-10-CM | POA: Diagnosis not present

## 2024-05-01 DIAGNOSIS — H33191 Other retinoschisis and retinal cysts, right eye: Secondary | ICD-10-CM | POA: Diagnosis not present

## 2024-05-01 DIAGNOSIS — H35371 Puckering of macula, right eye: Secondary | ICD-10-CM | POA: Diagnosis not present

## 2024-05-01 DIAGNOSIS — H40011 Open angle with borderline findings, low risk, right eye: Secondary | ICD-10-CM | POA: Diagnosis not present

## 2024-05-08 ENCOUNTER — Telehealth: Payer: Self-pay | Admitting: Pharmacist

## 2024-05-08 NOTE — Telephone Encounter (Signed)
 2 bottles of clofazimine  are located in the pharmacy office when patient needs a refill.  Skyylar Kopf L. Mohammed Mcandrew, PharmD, BCIDP, AAHIVP, CPP Infectious Diseases Clinical Pharmacist Practitioner Clinical Pharmacist Lead, Specialty Pharmacy [facility] for Infectious Disease

## 2024-05-21 ENCOUNTER — Encounter: Payer: Self-pay | Admitting: Infectious Disease

## 2024-05-31 ENCOUNTER — Encounter: Payer: Self-pay | Admitting: Internal Medicine

## 2024-05-31 ENCOUNTER — Ambulatory Visit: Admitting: Internal Medicine

## 2024-05-31 ENCOUNTER — Telehealth: Payer: Self-pay

## 2024-05-31 VITALS — BP 110/78 | HR 108 | Temp 98.5°F | Ht 66.0 in | Wt 104.0 lb

## 2024-05-31 DIAGNOSIS — Z8619 Personal history of other infectious and parasitic diseases: Secondary | ICD-10-CM

## 2024-05-31 DIAGNOSIS — Z85118 Personal history of other malignant neoplasm of bronchus and lung: Secondary | ICD-10-CM

## 2024-05-31 DIAGNOSIS — Z853 Personal history of malignant neoplasm of breast: Secondary | ICD-10-CM

## 2024-05-31 DIAGNOSIS — H6501 Acute serous otitis media, right ear: Secondary | ICD-10-CM

## 2024-05-31 DIAGNOSIS — H938X1 Other specified disorders of right ear: Secondary | ICD-10-CM

## 2024-05-31 DIAGNOSIS — M810 Age-related osteoporosis without current pathological fracture: Secondary | ICD-10-CM

## 2024-05-31 MED ORDER — CEFTRIAXONE SODIUM 1 G IJ SOLR
1.0000 g | Freq: Once | INTRAMUSCULAR | Status: AC
Start: 2024-05-31 — End: 2024-05-31
  Administered 2024-05-31: 1 g via INTRAMUSCULAR

## 2024-05-31 NOTE — Progress Notes (Addendum)
 Patient Care Team: Perri Ronal PARAS, MD as PCP - General (Internal Medicine) Shelah Lamar RAMAN, MD as Consulting Physician (Pulmonary Disease) Medora Ned, MD as Referring Physician Armbruster, Elspeth SQUIBB, MD as Consulting Physician (Gastroenterology) Nada Macintosh, FNP (Family Medicine) Shannon Agent, MD as Consulting Physician (Radiation Oncology) Ebbie Cough, MD as Consulting Physician (General Surgery) Rolan Ezra RAMAN, MD as Consulting Physician (Cardiology) Bensimhon, Toribio SAUNDERS, MD as Consulting Physician (Cardiology) Crawford Morna Pickle, NP as Nurse Practitioner (Hematology and Oncology)  Visit Date: 05/31/24  Subjective:    Patient ID: Anna Valentine , Female   DOB: [birth year], 73 y.o.    MRN: [id number]   73 y.o. Female presents today for ear fullness. Patient has a past medical history of Pulmonary hypertension, glaucoma .  For three weeks, she has been experiencing fullness and crackling in her right ear and mild symptoms of vertigo. She has experienced similar symptoms and was treated wth a Depomedrol injection which seemed to help. She has a history of non small cell lung cancer requiring right lower lobectomy and Bronchiectasis. She was diagnosed with Mycobacterium avium complex in 2021 and is currently on Azithromycin , Clofazimine , Ethambutol . She had dyspnea upon exertion. July 2024 CT  Chronic volume loss in the right lung with varicoid bronchiectasis and a large irregular right upper lobe bulla. There is only a very small amount of functional lung on the right, primarily in the right upper lobe, similar to previous. The goal is to mitigate putting her on any more antibiotics.   Past Medical History:  Diagnosis Date   Anemia    hx of IDA   BRCA negative 2011   BRCA I/ II negative   Breast cancer (HCC) 08/2009   LEFT=stage 2, rx with lumpectomy and xrt   Breast cancer (HCC) 2019   RIGHT   Bronchiectasis (HCC)    Chronic diastolic CHF (congestive heart  failure) (HCC) 02/20/2020   Constipation    Essential hypertension 02/20/2020   Family history of adverse reaction to anesthesia    My Sister has nausea   Family history of breast cancer    Family history of colon cancer    Family history of lung cancer    GERD (gastroesophageal reflux disease)    not presently having symptoms   Hypertension    Iatrogenic pneumothorax 02/06/2019   Lung cancer (HCC) 06/06/2005   stage 1 poorly differentiated adenocarcinoma, s/p right lower lobectomy.   Lung cancer (HCC) 12/26/2022   Mycobacterium avium complex (HCC)    dx 2021   Osteopenia 09/13/2013   Osteoporosis    Personal history of lung cancer    Pneumonia    2007ish, walking pnemonia - 2009 ish   S/P lobectomy of lung 06/26/2023   STD (sexually transmitted disease)    HSV   Transaminitis 12/26/2022   Vitamin D  deficiency 09/13/2013     Family History  Problem Relation Age of Onset   Allergies Mother    Asthma Mother    Lung cancer Mother    Breast cancer Mother 74       recurrence age 33   Colon cancer Father 32   Colon polyps Father 90   Prostate cancer Brother 41   Breast cancer Sister 1       Recurrence age 66 BRCA negative   Colon polyps Sister    Leukemia Sister    Breast cancer Sister 80   Prostate cancer Brother 70   Breast cancer Maternal Grandmother 10   Colon  cancer Maternal Aunt    Leukemia Maternal Grandfather    Lung cancer Maternal Aunt    Breast cancer Cousin    Esophageal cancer Neg Hx    Rectal cancer Neg Hx    Stomach cancer Neg Hx     Social Hx widow- resides alone  No children     Review of Systems  HENT:  Positive for ear pain.         Objective:   Vitals: BP 110/78   Pulse (!) 108   Temp 98.5 F (36.9 C)   Ht 5' 6 (1.676 m)   Wt 104 lb (47.2 kg)   SpO2 95%   BMI 16.79 kg/m    Physical Exam HENT:     Ears:     Comments: Right TM full.    Neck supple. Chest is clear. Pharynx is clear. Left TM is normal.    Results:    Labs:       Component Value Date/Time   NA 135 03/04/2024 1133   NA 139 12/22/2016 1141   K 4.2 03/04/2024 1133   K 4.2 12/22/2016 1141   CL 99 03/04/2024 1133   CL 103 11/22/2012 1025   CO2 28 03/04/2024 1133   CO2 28 12/22/2016 1141   GLUCOSE 87 03/04/2024 1133   GLUCOSE 92 12/22/2016 1141   GLUCOSE 94 11/22/2012 1025   BUN 13 03/04/2024 1133   BUN 13.2 12/22/2016 1141   CREATININE 0.75 03/04/2024 1133   CREATININE 0.7 12/22/2016 1141   CALCIUM  9.2 03/04/2024 1133   CALCIUM  9.7 12/22/2016 1141   PROT 7.3 06/26/2023 0912   PROT 7.0 12/22/2016 1141   ALBUMIN 3.7 10/11/2021 1130   ALBUMIN 3.8 12/22/2016 1141   AST 14 06/26/2023 0912   AST 34 09/22/2020 1309   AST 18 12/22/2016 1141   ALT 8 06/26/2023 0912   ALT 82 (H) 09/22/2020 1309   ALT 10 12/22/2016 1141   ALKPHOS 89 10/11/2021 1130   ALKPHOS 81 12/22/2016 1141   BILITOT 0.5 06/26/2023 0912   BILITOT 0.3 09/22/2020 1309   BILITOT 0.65 12/22/2016 1141   GFRNONAA >60 12/02/2022 1215   GFRNONAA >60 09/22/2020 1309   GFRNONAA 93 04/16/2020 1225   GFRAA 108 04/16/2020 1225     Lab Results  Component Value Date   WBC 4.5 06/26/2023   HGB 12.0 06/26/2023   HCT 37.1 06/26/2023   MCV 91.6 06/26/2023   PLT 340 06/26/2023    Lab Results  Component Value Date   CHOL 202 (H) 06/26/2023   HDL 65 06/26/2023   LDLCALC 120 (H) 06/26/2023   TRIG 75 06/26/2023   CHOLHDL 3.1 06/26/2023     Lab Results  Component Value Date   TSH 2.85 06/26/2023        Assessment & Plan:   Meds ordered this encounter  Medications   cefTRIAXone  (ROCEPHIN ) injection 1 g   Right serous otitis media  For three weeks, she has been experiencing fullness and crackling in her right ear and mild symptoms of vertigo. She has experienced this before and was given a Depo medrol  injection. She has a history of non small cell lung cancer requiring right lower lobectomy and Bronchiectasis. She was diagnosed with Mycobacterium avium  complex in 2021 and is currently on Azithromycin , Clofazimine , Ethambutol . She had dyspnea upon exertion. July 2024 CT  Chronic volume loss in the right lung with varicoid bronchiectasis and a large irregular right upper lobe bulla. There is only a very  small amount of functional lung on the right, primarily in the right upper lobe, similar to previous. The goal is to mitigate putting her on any more antibiotics.   Rocephin  1 g IM Injection received today- one time dose. Call if symptoms persist. This injection worked well for her previously with similar presentation of symptoms.  I,Makayla C Reid,acting as a scribe for Ronal JINNY Hailstone, MD.,have documented all relevant documentation on the behalf of Ronal JINNY Hailstone, MD,as directed by  Ronal JINNY Hailstone, MD while in the presence of Ronal JINNY Hailstone, MD.

## 2024-05-31 NOTE — Telephone Encounter (Signed)
 Patient picked up 2 bottles of Clofazimine 

## 2024-06-03 NOTE — Telephone Encounter (Signed)
Thanks Donna!

## 2024-06-08 NOTE — Patient Instructions (Signed)
 You have been given one gram IM Rocephin  in office forright serous otitis media which worked well a few years back when you had similar symptoms and presentation. Please call if symptoms do not improve within a few days.

## 2024-06-17 NOTE — Progress Notes (Signed)
 "  Annual Wellness Visit   Patient Care Team: Krist Rosenboom, Ronal PARAS, MD as PCP - General (Internal Medicine) Shelah Lamar RAMAN, MD as Consulting Physician (Pulmonary Disease) Medora Ned, MD as Referring Physician Armbruster, Elspeth SQUIBB, MD as Consulting Physician (Gastroenterology) Nada Macintosh, FNP (Family Medicine) Shannon Agent, MD as Consulting Physician (Radiation Oncology) Ebbie Cough, MD as Consulting Physician (General Surgery) Rolan Ezra RAMAN, MD as Consulting Physician (Cardiology) Bensimhon, Toribio SAUNDERS, MD as Consulting Physician (Cardiology) Crawford Morna Pickle, NP as Nurse Practitioner (Hematology and Oncology)  Visit Date: 07/01/2024   Chief Complaint  Patient presents with   Annual Exam   Medicare Wellness   Subjective:  Patient: Anna Valentine, Female DOB: Nov 22, 1950, 73 y.o. MRN: [id number] There were no vitals filed for this visit. [name] is a 73 y.o. Female who presents today for her Annual Wellness Visit. Patient has Cough; Bronchiectasis without complication (HCC); Anxiety; History of neutropenia; Family history of colon cancer; Osteopenia; Postmenopausal atrophic vaginitis; Vitamin D  deficiency; Malignant neoplasm of upper-inner quadrant of left breast in female, estrogen receptor positive (HCC); Abnormal CT of the chest; Malignant neoplasm of lower lobe of right lung (HCC); Chest pain; Family history of breast cancer; Family history of lung cancer; Personal history of lung cancer; Genetic testing; Port-A-Cath in place; Li-Fraumeni syndrome; Postprocedural pneumothorax; Malignant neoplasm of overlapping sites of right breast in female, estrogen receptor positive (HCC); Weight loss, non-intentional; Iron deficiency anemia; Right epiretinal membrane; Right retinoschisis; Nuclear sclerotic cataract of right eye; Nuclear sclerotic cataract of left eye; Mycobacterium avium infection (HCC); Dehydration with hyponatremia; Intractable nausea and vomiting; Low  back pain; Chronic diastolic CHF (congestive heart failure) (HCC); Essential hypertension; Acute metabolic encephalopathy; Hyponatremia; Skin rash; Hearing loss; Localized osteoporosis with current pathological fracture; Posterior vitreous detachment of both eyes; Diarrhea; Transaminitis; Lung cancer (HCC); Dyspnea on exertion; S/P lobectomy of lung; Age-related osteoporosis without current pathological fracture; Estrogen deficiency; and Raynaud's disease without gangrene on their problem list.   During a visit with Dr Sam, she noticed that her feet were discolored. Dr. Sam referred her to a rheumatologist, Dr. Jeannetta regarding the discoloration in her feet.    History of Hyperlipidemia treated with Crestor  10 mg.  06/27/2024 Lipid Panel normal.   History of pulmonary hypertension and followed by cardiology.   History of Mycobacterium AVM infection followed by infectious disease clinic.  She is also followed by Dr. Shelah, pulmonologist.     Remote history of breast cancer.  History of non-small cell lung cancer in 2006.  Pathology showed 1.2 cm poorly differentiated adenocarcinoma.  Was seen at [facility] and also by Dr. Layla.  She does tolerate azithromycin .  In 2020 Dr. Shelah discovered bronchiectasis and infection with Mycobacterium avium colonized with Pseudomonas treated with multiple antibiotics.  She subsequently developed a thick wall cavitary lesion in the posterior right upper lobe July 2021.  PET scan showed hypermetabolism in the cavitary lesion as well as the right lung parenchyma.  She was started on 3 drug therapy for Mycobacterium avium intracellulare including azithromycin , rifampin  and ethambutol  which led to hyponatremia and volume depletion due to nausea.     In February 2011 was diagnosed with breast cancer and underwent left lumpectomy with diagnosis of lobular carcinoma.  Sentinel node biopsy showed 1 out of 4 lymph nodes positive for invasive lobular carcinoma and she  was clinically staged 2.  Tumor was ER positive PR positive HER2/neu negative.  Underwent radiation therapy which was completed in July 2011.  She subsequently underwent  total bilateral mastectomies with right sentinel lymph node biopsy September 2019.  She had right breast cancer triple positive at that time.  Genetic testing revealed positive p53 gene mutation. Underwent chemotherapy, Herceptin  450 mg,  is now under observation by oncologist.   History of iron deficiency and has received iron infusions.      History of compression deformity involving T7 vertebral body with 60% anterior height loss and has been referred to endocrinology regarding osteoporosis treatment.  Bone density study January 2023 showed T-score of -3.5 in the AP spine and -4.0 in the left femur. Now seeing Dr. Sam for regarding osteoporosis.  Had thoracic compression fracture in 2022 after lifting heavy suitcase.   She has a history of open angle glaucoma with borderline findings and hypertensive retinopathy seen by Dr. Cleatus.  Also has puckering of macula right eye.   Now seeing Dr. Gudena for follow-up on triple positive breast cancer with homozygous p53 mutation.  Currently on anastrozole .  Generally seen yearly.   Labs 06/27/2024  Hemoglobin 11.6, Absolute lymphocytes 448, Otherwise WNL.   11/13/2020 Colonoscopy A single colonic angiodysplastic lesion. Internal hemorrhoids. The examination was otherwise normal. No polyps. Repeat in 5 years  Vaccine counseling: UTD on RSV vaccine, Discontinued hepatitis screening.    Health Maintenance  Topic Date Due   Zoster Vaccines- Shingrix (1 of 2) 09/02/1970   COVID-19 Vaccine (8 - 2025-26 season) 03/11/2024   Medicare Annual Wellness (AWV)  06/28/2024   Colonoscopy  11/13/2025   DTaP/Tdap/Td (4 - Td or Tdap) 04/24/2034   Pneumococcal Vaccine: 50+ Years  Completed   Influenza Vaccine  Completed   Bone Density Scan  Completed   Meningococcal B Vaccine  Aged Out    Mammogram  Discontinued   Hepatitis C Screening  Discontinued     Review of Systems  Constitutional:  Negative for fever and malaise/fatigue.  HENT:  Negative for congestion.   Eyes:  Negative for blurred vision.  Respiratory:  Negative for cough and shortness of breath.   Cardiovascular:  Negative for chest pain, palpitations and leg swelling.  Gastrointestinal:  Negative for vomiting.  Musculoskeletal:  Negative for back pain.  Skin:        Discoloration of feet and lower extremities.   Neurological:  Negative for loss of consciousness and headaches.   Objective:  Vitals: body mass index is 16.79 kg/m. Today's Vitals   07/01/24 1401  Height: 5' 6 (1.676 m)   Physical Exam Vitals and nursing note reviewed.  Constitutional:      General: She is not in acute distress.    Appearance: Normal appearance. She is not ill-appearing or toxic-appearing.  HENT:     Head: Normocephalic and atraumatic.     Right Ear: Hearing, tympanic membrane, ear canal and external ear normal.     Left Ear: Hearing, tympanic membrane, ear canal and external ear normal.     Mouth/Throat:     Pharynx: Oropharynx is clear.  Eyes:     Extraocular Movements: Extraocular movements intact.     Conjunctiva/sclera:     Right eye: Hemorrhage present.     Pupils: Pupils are equal, round, and reactive to light.     Comments: Scleral hemorrhage of right eye.   Neck:     Thyroid : No thyroid  mass, thyromegaly or thyroid  tenderness.     Vascular: No carotid bruit.  Cardiovascular:     Rate and Rhythm: Normal rate and regular rhythm. No extrasystoles are present.    Pulses:  Dorsalis pedis pulses are 2+ on the right side and 2+ on the left side.     Heart sounds: Normal heart sounds. No murmur heard.    No friction rub. No gallop.  Pulmonary:     Effort: Pulmonary effort is normal.     Breath sounds: Normal breath sounds. No decreased breath sounds, wheezing, rhonchi or rales.  Chest:     Chest  wall: No mass.  Abdominal:     Palpations: Abdomen is soft. There is no hepatomegaly, splenomegaly or mass.     Tenderness: There is no abdominal tenderness.     Hernia: No hernia is present.  Musculoskeletal:     Cervical back: Normal range of motion.     Right lower leg: No edema.     Left lower leg: No edema.  Feet:     Right foot:     Skin integrity: Erythema present.     Left foot:     Skin integrity: Erythema present.  Lymphadenopathy:     Cervical: No cervical adenopathy.     Upper Body:     Right upper body: No supraclavicular adenopathy.     Left upper body: No supraclavicular adenopathy.  Skin:    General: Skin is warm and dry.  Neurological:     General: No focal deficit present.     Mental Status: She is alert and oriented to person, place, and time. Mental status is at baseline.     Sensory: Sensation is intact.     Motor: Motor function is intact. No weakness.     Deep Tendon Reflexes: Reflexes are normal and symmetric.  Psychiatric:        Attention and Perception: Attention normal.        Mood and Affect: Mood normal.        Speech: Speech normal.        Behavior: Behavior normal.        Thought Content: Thought content normal.        Cognition and Memory: Cognition normal.        Judgment: Judgment normal.     Current Outpatient Medications  Medication Instructions   acyclovir  (ZOVIRAX ) 400 mg, Oral, 2 times daily   albuterol  (VENTOLIN  HFA) 108 (90 Base) MCG/ACT inhaler INHALE 2 PUFFS INTO LUNGS EVERY 6 HOURS AS NEEDED FOR WHEEZING/SHORTNESS OF BREATH   ascorbic acid  (VITAMIN C ) 500 mg, Daily   Calcium  Carbonate-Vit D-Min (CALCIUM  1200 PO) Daily   Cholecalciferol  (D3 2000 PO) Take by mouth.   clofazimine  100 mg, Oral, Daily with breakfast   ethambutol  (MYAMBUTOL ) 800 mg, Oral, Daily, 800 mg   promethazine  (PHENERGAN ) 12.5 mg, Oral, Every 6 hours PRN   rosuvastatin  (CRESTOR ) 10 mg, Oral, Daily   Past Medical History:  Diagnosis Date   Anemia    hx  of IDA   BRCA negative 2011   BRCA I/ II negative   Breast cancer (HCC) 08/2009   LEFT=stage 2, rx with lumpectomy and xrt   Breast cancer (HCC) 2019   RIGHT   Bronchiectasis (HCC)    Chronic diastolic CHF (congestive heart failure) (HCC) 02/20/2020   Constipation    Essential hypertension 02/20/2020   Family history of adverse reaction to anesthesia    My Sister has nausea   Family history of breast cancer    Family history of colon cancer    Family history of lung cancer    GERD (gastroesophageal reflux disease)    not presently having symptoms  Hypertension    Iatrogenic pneumothorax 02/06/2019   Lung cancer (HCC) 06/06/2005   stage 1 poorly differentiated adenocarcinoma, s/p right lower lobectomy.   Lung cancer (HCC) 12/26/2022   Mycobacterium avium complex (HCC)    dx 2021   Osteopenia 09/13/2013   Osteoporosis    Personal history of lung cancer    Pneumonia    2007ish, walking pnemonia - 2009 ish   S/P lobectomy of lung 06/26/2023   STD (sexually transmitted disease)    HSV   Transaminitis 12/26/2022   Vitamin D  deficiency 09/13/2013   Medical/Surgical History Narrative:  Allergic/Intolerant to:  Allergies  Allergen Reactions   Rifampin  Nausea And Vomiting   Taxol  [Paclitaxel ] Anaphylaxis   Paclitaxel  (Protein-Bound) Hives   Tape Itching   Abraxane  [Paclitaxel  Protein-Bound Part] Rash   Clarithromycin Rash    Past Surgical History:  Procedure Laterality Date   BREAST BIOPSY     BREAST LUMPECTOMY  10/12/2009   Left lumpectomy and radiation, stage II, ER/PR+, Her 2 nu negative   BUNIONECTOMY Bilateral    COLONOSCOPY  2018   SA-MAC-suprep-no polyps   COLONOSCOPY     LOBECTOMY Right 06/06/2005   [facility]   MASTECTOMY W/ SENTINEL NODE BIOPSY Bilateral 03/15/2018   MASTECTOMY W/ SENTINEL NODE BIOPSY Bilateral 03/15/2018   Procedure: BILATERAL TOTAL MASTECTOMIES WITH RIGHT SENTINEL LYMPH NODE BIOPSY;  Surgeon: Ebbie Cough, MD;  Location: [facility] OR;  Service:  General;  Laterality: Bilateral;   PORTACATH PLACEMENT N/A 03/15/2018   Procedure: INSERTION PORT-A-CATH WITH US ;  Surgeon: Ebbie Cough, MD;  Location: [facility] OR;  Service: General;  Laterality: N/A;   TONSILLECTOMY     TUBAL LIGATION  1984   VIDEO BRONCHOSCOPY  02/06/2019   Flexible video fiberoptic bronchoscopy with electromagnetic navigation and biopsies.   VIDEO BRONCHOSCOPY WITH ENDOBRONCHIAL NAVIGATION Left 02/06/2019   Procedure: VIDEO BRONCHOSCOPY WITH ENDOBRONCHIAL NAVIGATION, left lung;  Surgeon: Shelah Lamar RAMAN, MD;  Location: [facility] OR;  Service: Thoracic;  Laterality: Left;   VIDEO BRONCHOSCOPY WITH ENDOBRONCHIAL NAVIGATION N/A 01/08/2020   Procedure: VIDEO BRONCHOSCOPY WITH ENDOBRONCHIAL NAVIGATION;  Surgeon: Shelah Lamar RAMAN, MD;  Location: [facility] OR;  Service: Thoracic;  Laterality: N/A;   Family History  Problem Relation Age of Onset   Allergies Mother    Asthma Mother    Lung cancer Mother    Breast cancer Mother 69       recurrence age 88   Colon cancer Father 47   Colon polyps Father 60   Prostate cancer Brother 57   Breast cancer Sister 40       Recurrence age 8 BRCA negative   Colon polyps Sister    Leukemia Sister    Breast cancer Sister 17   Prostate cancer Brother 47   Breast cancer Maternal Grandmother 35   Colon cancer Maternal Aunt    Leukemia Maternal Grandfather    Lung cancer Maternal Aunt    Breast cancer Cousin    Esophageal cancer Neg Hx    Rectal cancer Neg Hx    Stomach cancer Neg Hx    Social Hx widow resides alone    Most Recent Health Risks Assessment:   Most Recent Social Determinants of Health (Including Hx of Tobacco, Alcohol, and Drug Use) SDOH Screenings   Food Insecurity: No Food Insecurity (07/01/2024)  Housing: Low Risk (07/01/2024)  Transportation Needs: No Transportation Needs (07/01/2024)  Utilities: Not At Risk (07/01/2024)  Alcohol Screen: Low Risk (06/26/2023)  Depression (PHQ2-9): Low Risk (07/01/2024)  Financial Resource  Strain: Low Risk (06/26/2023)  Physical Activity: Inactive (07/01/2024)  Social Connections: Moderately Integrated (07/01/2024)  Stress: No Stress Concern Present (07/01/2024)  Tobacco Use: Medium Risk (07/01/2024)  Health Literacy: Adequate Health Literacy (07/01/2024)   Social History   Tobacco Use   Smoking status: Former    Current packs/day: 0.00    Average packs/day: 2.0 packs/day for 18.0 years (36.0 ttl pk-yrs)    Types: Cigarettes    Start date: 07/11/1969    Quit date: 07/12/1987    Years since quitting: 36.9   Smokeless tobacco: Never  Vaping Use   Vaping status: Never Used  Substance Use Topics   Alcohol use: Yes    Comment: occasionally   Drug use: No    Most Recent Fall Risk Assessment:    07/01/2024    2:06 PM  Fall Risk   Falls in the past year? 0  Number falls in past yr: 0  Injury with Fall? 0  Risk for fall due to : No Fall Risks  Follow up Falls prevention discussed;Education provided;Falls evaluation completed   Most Recent Anxiety/Depression Screenings:    07/01/2024    2:00 PM 06/27/2023   11:08 AM  PHQ 2/9 Scores  PHQ - 2 Score 0 0  PHQ- 9 Score 0       07/01/2024    2:00 PM  GAD 7 : Generalized Anxiety Score  Nervous, Anxious, on Edge 0  Control/stop worrying 0  Worry too much - different things 0  Trouble relaxing 0  Restless 0  Easily annoyed or irritable 0  Afraid - awful might happen 0  Total GAD 7 Score 0  Anxiety Difficulty Not difficult at all   Most Recent Cognitive Screening:    07/01/2024    2:06 PM  6CIT Screen  What Year? 0 points  What month? 0 points  What time? 0 points  Count back from 20 0 points  Months in reverse 0 points  Repeat phrase 0 points  Total Score 0 points   Most Recent Vision/Hearing Screenings:Vision Screening - Comments:: Patient states she is up to date with her eye exam at [facility] Dr. Jona  Results:  Studies Obtained And Personally Reviewed By Me:  11/13/2020 Colonoscopy A  single colonic angiodysplastic lesion. Internal hemorrhoids. The examination was otherwise normal. No polyps. Repeat in 5 years   Labs:  CBC w/ Differential Lab Results  Component Value Date   WBC 4.0 06/27/2024   RBC 3.87 06/27/2024   HGB 11.6 (L) 06/27/2024   HCT 36.2 06/27/2024   PLT 234 06/27/2024   MCV 93.5 06/27/2024   MCH 30.0 06/27/2024   MCHC 32.0 06/27/2024   RDW 13.3 06/27/2024   MPV 10.1 06/27/2024   LYMPHSABS 573 (L) 12/26/2022   MONOABS 0.5 11/02/2021   BASOSABS 8 06/27/2024    Comprehensive Metabolic Panel Lab Results  Component Value Date   NA 137 06/27/2024   K 4.7 06/27/2024   CL 101 06/27/2024   CO2 28 06/27/2024   GLUCOSE 81 06/27/2024   BUN 16 06/27/2024   CREATININE 0.78 06/27/2024   CALCIUM  9.6 06/27/2024   PROT 6.7 06/27/2024   ALBUMIN 3.7 10/11/2021   AST 26 06/27/2024   ALT 21 06/27/2024   ALKPHOS 89 10/11/2021   BILITOT 0.6 06/27/2024   GFR 86.11 02/23/2022   EGFR 81 06/27/2024   GFRNONAA >60 12/02/2022   Lipid Panel  Lab Results  Component Value Date   CHOL 148 06/27/2024   HDL 69 06/27/2024  LDLCALC 65 06/27/2024   TRIG 61 06/27/2024   TSH Lab Results  Component Value Date   TSH 2.85 06/27/2024    Assessment & Plan:    During a visit with Dr Sam, she noticed that her feet were discolored. Dr. Sam referred her to a rheumatologist, Dr. Jeannetta regarding the discoloration in her feet.    Hyperlipidemia: treated with Crestor  10 mg.  06/27/2024 Lipid panel.   pulmonary hypertension: and followed by cardiology.   Mycobacterium AVM infection: followed by infectious disease clinic.  She is also followed by Dr. Shelah, pulmonologist.    History of compression deformity involving T7 vertebral body with 60% anterior height loss and has been referred to endocrinology regarding osteoporosis treatment.  Bone density study January 2023 showed T-score of -3.5 in the AP spine and -4.0 in the left femur. Now seeing Dr.  Sam for regarding osteoporosis.  Had thoracic compression fracture in 2022 after lifting heavy suitcase.   She has a history of open angle glaucoma with borderline findings and hypertensive retinopathy seen by Dr. Cleatus.  Also has puckering of macula right eye.   Now seeing Dr. Gudena for follow-up on triple positive breast cancer with homozygous p53 mutation.  Generally seen yearly.   11/13/2020 Colonoscopy A single colonic angiodysplastic lesion. Internal hemorrhoids. The examination was otherwise normal. No polyps. Repeat in 5 years  Vaccine counseling: UTD on RSV vaccine, Discontinued hepatitis screening.      Annual Wellness Visit done today including the all of the following: Reviewed patient's Family Medical History Reviewed patient's SDOH and reviewed tobacco, alcohol, and drug use.  Reviewed and updated list of patient's medical providers Assessment of cognitive impairment was done Assessed patient's functional ability Established a written schedule for health screening services Health Risk Assessent Completed and Reviewed  Discussed health benefits of physical activity, and encouraged her to engage in regular exercise appropriate for her age and condition.    I,Makayla C Reid,acting as a scribe for Ronal JINNY Hailstone, MD.,have documented all relevant documentation on the behalf of Ronal JINNY Hailstone, MD,as directed by  Ronal JINNY Hailstone, MD while in the presence of Ronal JINNY Hailstone, MD.  I, Ronal JINNY Hailstone, MD, have reviewed all documentation for and agree with the above Annual Wellness Visit documentation.  Ronal JINNY Hailstone, MD Internal Medicine 07/01/2024 "

## 2024-06-18 NOTE — Telephone Encounter (Signed)
 Prolia  VOB initiated via [email or web address]  Next Prolia  inj DUE: 07/12/24

## 2024-06-24 NOTE — Telephone Encounter (Signed)
 Anna Valentine

## 2024-06-27 ENCOUNTER — Other Ambulatory Visit: Payer: Medicare Other

## 2024-06-27 DIAGNOSIS — Z0000 Encounter for general adult medical examination without abnormal findings: Secondary | ICD-10-CM

## 2024-06-27 DIAGNOSIS — M810 Age-related osteoporosis without current pathological fracture: Secondary | ICD-10-CM

## 2024-06-27 DIAGNOSIS — E7800 Pure hypercholesterolemia, unspecified: Secondary | ICD-10-CM

## 2024-06-27 DIAGNOSIS — E2839 Other primary ovarian failure: Secondary | ICD-10-CM

## 2024-06-27 DIAGNOSIS — Z1329 Encounter for screening for other suspected endocrine disorder: Secondary | ICD-10-CM

## 2024-06-28 LAB — COMPREHENSIVE METABOLIC PANEL WITH GFR
AG Ratio: 1.4 (calc) (ref 1.0–2.5)
ALT: 21 U/L (ref 6–29)
AST: 26 U/L (ref 10–35)
Albumin: 3.9 g/dL (ref 3.6–5.1)
Alkaline phosphatase (APISO): 82 U/L (ref 37–153)
BUN: 16 mg/dL (ref 7–25)
CO2: 28 mmol/L (ref 20–32)
Calcium: 9.6 mg/dL (ref 8.6–10.4)
Chloride: 101 mmol/L (ref 98–110)
Creat: 0.78 mg/dL (ref 0.60–1.00)
Globulin: 2.8 g/dL (ref 1.9–3.7)
Glucose, Bld: 81 mg/dL (ref 65–99)
Potassium: 4.7 mmol/L (ref 3.5–5.3)
Sodium: 137 mmol/L (ref 135–146)
Total Bilirubin: 0.6 mg/dL (ref 0.2–1.2)
Total Protein: 6.7 g/dL (ref 6.1–8.1)
eGFR: 81 mL/min/1.73m2

## 2024-06-28 LAB — CBC WITH DIFFERENTIAL/PLATELET
Absolute Lymphocytes: 448 {cells}/uL — ABNORMAL LOW (ref 850–3900)
Absolute Monocytes: 396 {cells}/uL (ref 200–950)
Basophils Absolute: 8 {cells}/uL (ref 0–200)
Basophils Relative: 0.2 %
Eosinophils Absolute: 20 {cells}/uL (ref 15–500)
Eosinophils Relative: 0.5 %
HCT: 36.2 % (ref 35.9–46.0)
Hemoglobin: 11.6 g/dL — ABNORMAL LOW (ref 11.7–15.5)
MCH: 30 pg (ref 27.0–33.0)
MCHC: 32 g/dL (ref 31.6–35.4)
MCV: 93.5 fL (ref 81.4–101.7)
MPV: 10.1 fL (ref 7.5–12.5)
Monocytes Relative: 9.9 %
Neutro Abs: 3128 {cells}/uL (ref 1500–7800)
Neutrophils Relative %: 78.2 %
Platelets: 234 Thousand/uL (ref 140–400)
RBC: 3.87 Million/uL (ref 3.80–5.10)
RDW: 13.3 % (ref 11.0–15.0)
Total Lymphocyte: 11.2 %
WBC: 4 Thousand/uL (ref 3.8–10.8)

## 2024-06-28 LAB — LIPID PANEL
Cholesterol: 148 mg/dL
HDL: 69 mg/dL
LDL Cholesterol (Calc): 65 mg/dL
Non-HDL Cholesterol (Calc): 79 mg/dL
Total CHOL/HDL Ratio: 2.1 (calc)
Triglycerides: 61 mg/dL

## 2024-06-28 LAB — TSH: TSH: 2.85 m[IU]/L (ref 0.40–4.50)

## 2024-07-01 ENCOUNTER — Encounter: Payer: Self-pay | Admitting: Internal Medicine

## 2024-07-01 ENCOUNTER — Ambulatory Visit (INDEPENDENT_AMBULATORY_CARE_PROVIDER_SITE_OTHER): Payer: Medicare Other | Admitting: Internal Medicine

## 2024-07-01 VITALS — BP 110/70 | HR 60 | Ht 66.0 in | Wt 106.0 lb

## 2024-07-01 DIAGNOSIS — E7800 Pure hypercholesterolemia, unspecified: Secondary | ICD-10-CM

## 2024-07-01 DIAGNOSIS — Z85118 Personal history of other malignant neoplasm of bronchus and lung: Secondary | ICD-10-CM | POA: Diagnosis not present

## 2024-07-01 DIAGNOSIS — Z853 Personal history of malignant neoplasm of breast: Secondary | ICD-10-CM | POA: Diagnosis not present

## 2024-07-01 DIAGNOSIS — E785 Hyperlipidemia, unspecified: Secondary | ICD-10-CM | POA: Diagnosis not present

## 2024-07-01 DIAGNOSIS — I272 Pulmonary hypertension, unspecified: Secondary | ICD-10-CM

## 2024-07-01 DIAGNOSIS — Z0000 Encounter for general adult medical examination without abnormal findings: Secondary | ICD-10-CM | POA: Diagnosis not present

## 2024-07-01 DIAGNOSIS — Z8619 Personal history of other infectious and parasitic diseases: Secondary | ICD-10-CM | POA: Diagnosis not present

## 2024-07-01 DIAGNOSIS — M810 Age-related osteoporosis without current pathological fracture: Secondary | ICD-10-CM

## 2024-07-01 LAB — POCT URINALYSIS DIP (CLINITEK)
Bilirubin, UA: NEGATIVE
Blood, UA: NEGATIVE
Glucose, UA: NEGATIVE mg/dL
Ketones, POC UA: NEGATIVE mg/dL
Leukocytes, UA: NEGATIVE
Nitrite, UA: NEGATIVE
POC PROTEIN,UA: NEGATIVE
Spec Grav, UA: 1.01
Urobilinogen, UA: 0.2 U/dL
pH, UA: 6.5

## 2024-07-01 LAB — TSH

## 2024-07-01 LAB — CBC WITH DIFFERENTIAL/PLATELET

## 2024-07-01 LAB — LIPID PANEL

## 2024-07-01 LAB — COMPREHENSIVE METABOLIC PANEL WITH GFR

## 2024-07-01 NOTE — Patient Instructions (Signed)
 Anna Valentine,  Thank you for taking the time for your Medicare Wellness Visit. I appreciate your continued commitment to your health goals. Please review the care plan we discussed, and feel free to reach out if I can assist you further.  Please note that Annual Wellness Visits do not include a physical exam. Some assessments may be limited, especially if the visit was conducted virtually. If needed, we may recommend an in-person follow-up with your provider.  Ongoing Care Seeing your primary care provider every 3 to 6 months helps us  monitor your health and provide consistent, personalized care.   Referrals If a referral was made during today's visit and you haven't received any updates within two weeks, please contact the referred provider directly to check on the status.  Recommended Screenings:  Health Maintenance  Topic Date Due   Hepatitis C Screening  Never done   Zoster (Shingles) Vaccine (1 of 2) 09/02/1970   COVID-19 Vaccine (8 - 2025-26 season) 03/11/2024   Medicare Annual Wellness Visit  06/28/2024   Colon Cancer Screening  11/13/2025   DTaP/Tdap/Td vaccine (4 - Td or Tdap) 04/24/2034   Pneumococcal Vaccine for age over 62  Completed   Flu Shot  Completed   Osteoporosis screening with Bone Density Scan  Completed   Meningitis B Vaccine  Aged Out   Breast Cancer Screening  Discontinued       07/01/2024    2:06 PM  Advanced Directives  Does Patient Have a Medical Advance Directive? Yes  Type of Advance Directive Living will;Healthcare Power of Attorney  Does patient want to make changes to medical advance directive? No - Patient declined  Copy of Healthcare Power of Attorney in Chart? No - copy requested    Vision: Annual vision screenings are recommended for early detection of glaucoma, cataracts, and diabetic retinopathy. These exams can also reveal signs of chronic conditions such as diabetes and high blood pressure.  Dental: Annual dental screenings help detect  early signs of oral cancer, gum disease, and other conditions linked to overall health, including heart disease and diabetes.  Please see the attached documents for additional preventive care recommendations.

## 2024-07-01 NOTE — Progress Notes (Addendum)
 "  Chief Complaint  Patient presents with   Annual Exam   Medicare Wellness     Subjective:   LEVERNE Valentine is a 73 y.o. female who presents for a Medicare Annual Wellness Visit.  Visit info / Clinical Intake: Medicare Wellness Visit Type:: Subsequent Annual Wellness Visit Persons participating in visit and providing information:: patient Medicare Wellness Visit Mode:: In-person (required for WTM) Interpreter Needed?: No Pre-visit prep was completed: yes AWV questionnaire completed by patient prior to visit?: no Living arrangements:: (!) lives alone Patient's Overall Health Status Rating: good Typical amount of pain: none Does pain affect daily life?: no Are you currently prescribed opioids?: no  Dietary Habits and Nutritional Risks How many meals a day?: 3 Eats fruit and vegetables daily?: yes Most meals are obtained by: preparing own meals In the last 2 weeks, have you had any of the following?: none Diabetic:: no  Functional Status Activities of Daily Living (to include ambulation/medication): Independent Ambulation: Independent Medication Administration: Independent Home Management (perform basic housework or laundry): Independent Manage your own finances?: yes Primary transportation is: driving Concerns about vision?: no *vision screening is required for WTM* Concerns about hearing?: no  Fall Screening Falls in the past year?: 0 Number of falls in past year: 0 Was there an injury with Fall?: 0 Fall Risk Category Calculator: 0 Patient Fall Risk Level: Low Fall Risk  Fall Risk Patient at Risk for Falls Due to: No Fall Risks Fall risk Follow up: Falls prevention discussed; Education provided; Falls evaluation completed  Home and Transportation Safety: All rugs have non-skid backing?: yes All stairs or steps have railings?: yes Grab bars in the bathtub or shower?: yes Have non-skid surface in bathtub or shower?: yes Good home lighting?: yes Regular seat  belt use?: yes Hospital stays in the last year:: no  Cognitive Assessment Difficulty concentrating, remembering, or making decisions? : no Will 6CIT or Mini Cog be Completed: yes What year is it?: 0 points What month is it?: 0 points Give patient an address phrase to remember (5 components): [address] [place] About what time is it?: 0 points Count backwards from 20 to 1: 0 points Say the months of the year in reverse: 0 points Repeat the address phrase from earlier: 0 points 6 CIT Score: 0 points  Advance Directives (For Healthcare) Does Patient Have a Medical Advance Directive?: Yes Does patient want to make changes to medical advance directive?: No - Patient declined Type of Advance Directive: Living will; Healthcare Power of Attorney Copy of Healthcare Power of Attorney in Chart?: No - copy requested Copy of Living Will in Chart?: No - copy requested  Reviewed/Updated  Reviewed/Updated: Reviewed All (Medical, Surgical, Family, Medications, Allergies, Care Teams, Patient Goals)    Allergies (verified) Rifampin , Taxol  [paclitaxel ], Paclitaxel  (protein-bound), Tape, Abraxane  [paclitaxel  protein-bound part], and Clarithromycin   Current Medications (verified) Outpatient Encounter Medications as of 07/01/2024  Medication Sig   acyclovir  (ZOVIRAX ) 400 MG tablet TAKE 1 TABLET TWICE DAILY   albuterol  (VENTOLIN  HFA) 108 (90 Base) MCG/ACT inhaler INHALE 2 PUFFS INTO LUNGS EVERY 6 HOURS AS NEEDED FOR WHEEZING/SHORTNESS OF BREATH   Calcium  Carbonate-Vit D-Min (CALCIUM  1200 PO) Take by mouth daily.   Cholecalciferol  (D3 2000 PO) Take by mouth.   clofazimine  50 mg CAPS capsule (for compassionate use) Take 2 capsules (100 mg total) by mouth daily with breakfast.   ethambutol  (MYAMBUTOL ) 400 MG tablet Take 2 tablets (800 mg total) by mouth daily. 800 mg   promethazine  (PHENERGAN )  12.5 MG tablet Take 1 tablet (12.5 mg total) by mouth every 6 (six) hours as needed for nausea or  vomiting.   rosuvastatin  (CRESTOR ) 10 MG tablet Take 1 tablet (10 mg total) by mouth daily.   vitamin C  (ASCORBIC ACID ) 500 MG tablet Take 500 mg by mouth daily.   [DISCONTINUED] azithromycin  (ZITHROMAX ) 250 MG tablet TAKE 1 TABLET BY MOUTH EVERY DAY.   Facility-Administered Encounter Medications as of 07/01/2024  Medication   [START ON 07/11/2024] denosumab  (PROLIA ) injection 60 mg    History: Past Medical History:  Diagnosis Date   Anemia    hx of IDA   BRCA negative 2011   BRCA I/ II negative   Breast cancer (HCC) 08/2009   LEFT=stage 2, rx with lumpectomy and xrt   Breast cancer (HCC) 2019   RIGHT   Bronchiectasis (HCC)    Chronic diastolic CHF (congestive heart failure) (HCC) 02/20/2020   Constipation    Essential hypertension 02/20/2020   Family history of adverse reaction to anesthesia    My Sister has nausea   Family history of breast cancer    Family history of colon cancer    Family history of lung cancer    GERD (gastroesophageal reflux disease)    not presently having symptoms   Hypertension    Iatrogenic pneumothorax 02/06/2019   Lung cancer (HCC) 06/06/2005   stage 1 poorly differentiated adenocarcinoma, s/p right lower lobectomy.   Lung cancer (HCC) 12/26/2022   Mycobacterium avium complex (HCC)    dx 2021   Osteopenia 09/13/2013   Osteoporosis    Personal history of lung cancer    Pneumonia    2007ish, walking pnemonia - 2009 ish   S/P lobectomy of lung 06/26/2023   STD (sexually transmitted disease)    HSV   Transaminitis 12/26/2022   Vitamin D  deficiency 09/13/2013   Past Surgical History:  Procedure Laterality Date   BREAST BIOPSY     BREAST LUMPECTOMY  10/12/2009   Left lumpectomy and radiation, stage II, ER/PR+, Her 2 nu negative   BUNIONECTOMY Bilateral    COLONOSCOPY  2018   SA-MAC-suprep-no polyps   COLONOSCOPY     LOBECTOMY Right 06/06/2005   [facility]   MASTECTOMY W/ SENTINEL NODE BIOPSY Bilateral 03/15/2018   MASTECTOMY W/ SENTINEL  NODE BIOPSY Bilateral 03/15/2018   Procedure: BILATERAL TOTAL MASTECTOMIES WITH RIGHT SENTINEL LYMPH NODE BIOPSY;  Surgeon: Ebbie Cough, MD;  Location[facility];  Service: General;  Laterality: Bilateral;   PORTACATH PLACEMENT N/A 03/15/2018   Procedure: INSERTION PORT-A-CATH WITH US ;  Surgeon: Ebbie Cough, MD;  Location: [facility] OR;  Service: General;  Laterality: N/A;   TONSILLECTOMY     TUBAL LIGATION  1984   VIDEO BRONCHOSCOPY  02/06/2019   Flexible video fiberoptic bronchoscopy with electromagnetic navigation and biopsies.   VIDEO BRONCHOSCOPY WITH ENDOBRONCHIAL NAVIGATION Left 02/06/2019   Procedure: VIDEO BRONCHOSCOPY WITH ENDOBRONCHIAL NAVIGATION, left lung;  Surgeon: Shelah Lamar RAMAN, MD;  Location: [facility] OR;  Service: Thoracic;  Laterality: Left;   VIDEO BRONCHOSCOPY WITH ENDOBRONCHIAL NAVIGATION N/A 01/08/2020   Procedure: VIDEO BRONCHOSCOPY WITH ENDOBRONCHIAL NAVIGATION;  Surgeon: Shelah Lamar RAMAN, MD;  Location: [facility] OR;  Service: Thoracic;  Laterality: N/A;   Family History  Problem Relation Age of Onset   Allergies Mother    Asthma Mother    Lung cancer Mother    Breast cancer Mother 94       recurrence age 9   Colon cancer Father 32   Colon polyps Father  48   Prostate cancer Brother 81   Breast cancer Sister 82       Recurrence age 93 BRCA negative   Colon polyps Sister    Leukemia Sister    Breast cancer Sister 6   Prostate cancer Brother 47   Breast cancer Maternal Grandmother 41   Colon cancer Maternal Aunt    Leukemia Maternal Grandfather    Lung cancer Maternal Aunt    Breast cancer Cousin    Esophageal cancer Neg Hx    Rectal cancer Neg Hx    Stomach cancer Neg Hx    Social History   Occupational History   Occupation: OFFICE MANAGER    Employer: [employer]  Tobacco Use   Smoking status: Former    Current packs/day: 0.00    Average packs/day: 2.0 packs/day for 18.0 years (36.0 ttl pk-yrs)    Types: Cigarettes    Start date: 07/11/1969    Quit date:  07/12/1987    Years since quitting: 36.9   Smokeless tobacco: Never  Vaping Use   Vaping status: Never Used  Substance and Sexual Activity   Alcohol use: Yes    Comment: occasionally   Drug use: No   Sexual activity: Yes   Tobacco Counseling Counseling given: Not Answered  SDOH Screenings   Food Insecurity: No Food Insecurity (07/01/2024)  Housing: Low Risk (07/01/2024)  Transportation Needs: No Transportation Needs (07/01/2024)  Utilities: Not At Risk (07/01/2024)  Alcohol Screen: Low Risk (06/26/2023)  Depression (PHQ2-9): Low Risk (07/01/2024)  Financial Resource Strain: Low Risk (06/26/2023)  Physical Activity: Inactive (07/01/2024)  Social Connections: Moderately Integrated (07/01/2024)  Stress: No Stress Concern Present (07/01/2024)  Tobacco Use: Medium Risk (07/01/2024)  Health Literacy: Adequate Health Literacy (07/01/2024)   See flowsheets for full screening details  Depression Screen PHQ 2 & 9 Depression Scale- Over the past 2 weeks, how often have you been bothered by any of the following problems? Little interest or pleasure in doing things: 0 Feeling down, depressed, or hopeless (PHQ Adolescent also includes...irritable): 0 PHQ-2 Total Score: 0 Trouble falling or staying asleep, or sleeping too much: 0 Feeling tired or having little energy: 0 Poor appetite or overeating (PHQ Adolescent also includes...weight loss): 0 Feeling bad about yourself - or that you are a failure or have let yourself or your family down: 0 Trouble concentrating on things, such as reading the newspaper or watching television (PHQ Adolescent also includes...like school work): 0 Moving or speaking so slowly that other people could have noticed. Or the opposite - being so fidgety or restless that you have been moving around a lot more than usual: 0 Thoughts that you would be better off dead, or of hurting yourself in some way: 0 PHQ-9 Total Score: 0 If you checked off any problems, how  difficult have these problems made it for you to do your work, take care of things at home, or get along with other people?: Not difficult at all     Goals Addressed               This Visit's Progress     Activity and Exercise Increased (pt-stated)        Evidence-based guidance:  Review current exercise levels.  Assess patient perspective on exercise or activity level, barriers to increasing activity, motivation and readiness for change.  Recommend or set healthy exercise goal based on individual tolerance.  Encourage small steps toward making change in amount of exercise or activity.  Urge reduction of  sedentary activities or screen time.  Promote group activities within the community or with family or support person.  Consider referral to rehabiliation therapist for assessment and exercise/activity plan.   Notes:              Objective:    Today's Vitals   07/01/24 1401  BP: 110/70  Pulse: 60  SpO2: 98%  Weight: 106 lb (48.1 kg)  Height: 5' 6 (1.676 m)  PainSc: 0-No pain   Body mass index is 17.11 kg/m.  Hearing/Vision screen Vision Screening - Comments:: Patient states she is up to date with her eye exam [facility] Jona  Immunizations and Health Maintenance Health Maintenance  Topic Date Due   Zoster Vaccines- Shingrix (1 of 2) 09/02/1970   COVID-19 Vaccine (8 - 2025-26 season) 03/11/2024   Medicare Annual Wellness (AWV)  07/01/2025   Colonoscopy  11/13/2025   DTaP/Tdap/Td (4 - Td or Tdap) 04/24/2034   Pneumococcal Vaccine: 50+ Years  Completed   Influenza Vaccine  Completed   Bone Density Scan  Completed   Meningococcal B Vaccine  Aged Out   Mammogram  Discontinued   Hepatitis C Screening  Discontinued        Assessment/Plan:  This is a routine wellness examination for [facility].  Patient Care Team: Perri Ronal PARAS, MD as PCP - General (Internal Medicine) Shelah Lamar RAMAN, MD as Consulting Physician (Pulmonary Disease) Medora Ned, MD as Referring Physician Armbruster, Elspeth SQUIBB, MD as Consulting Physician (Gastroenterology) Nada Macintosh, FNP (Family Medicine) Shannon Agent, MD as Consulting Physician (Radiation Oncology) Ebbie Cough, MD as Consulting Physician (General Surgery) Rolan Ezra RAMAN, MD as Consulting Physician (Cardiology) Bensimhon, Toribio SAUNDERS, MD as Consulting Physician (Cardiology) Crawford, Morna Pickle, NP as Nurse Practitioner (Hematology and Oncology)  I have personally reviewed and noted the following in the patients chart:   Medical and social history Use of alcohol, tobacco or illicit drugs  Current medications and supplements including opioid prescriptions. Functional ability and status Nutritional status Physical activity Advanced directives List of other physicians Hospitalizations, surgeries, and ER visits in previous 12 months Vitals Screenings to include cognitive, depression, and falls Referrals and appointments  Orders Placed This Encounter  Procedures   POCT URINALYSIS DIP (CLINITEK)   In addition, I have reviewed and discussed with patient certain preventive protocols, quality metrics, and best practice recommendations. A written personalized care plan for preventive services as well as general preventive health recommendations were provided to patient.   Araceli Zelda, CMA   07/01/2024   Return in 1 year (on 07/01/2025).  After Visit Summary: (In Person-Printed) AVS printed and given to the patient  Nurse Notes: none   I, Ronal PARAS Perri, MD, have reviewed all documentation for this visit. The documentation on 07/01/2024 for the exam, diagnosis, procedures, and orders are all accurate and complete.  "

## 2024-07-02 ENCOUNTER — Ambulatory Visit: Payer: Medicare Other

## 2024-07-12 ENCOUNTER — Ambulatory Visit

## 2024-07-12 VITALS — BP 108/70 | HR 93 | Resp 16

## 2024-07-12 DIAGNOSIS — M810 Age-related osteoporosis without current pathological fracture: Secondary | ICD-10-CM | POA: Diagnosis not present

## 2024-07-12 MED ORDER — DENOSUMAB 60 MG/ML ~~LOC~~ SOSY
60.0000 mg | PREFILLED_SYRINGE | Freq: Once | SUBCUTANEOUS | Status: AC
  Administered 2024-07-12: 60 mg via SUBCUTANEOUS

## 2024-07-12 MED ORDER — DENOSUMAB 60 MG/ML ~~LOC~~ SOSY: 60.0000 mg | PREFILLED_SYRINGE | Freq: Once | SUBCUTANEOUS | Status: AC

## 2024-07-12 NOTE — Progress Notes (Signed)
 After obtaining consent, and per orders of Dr. Sam, injection of Prolia given by Dietrich LOISE Lax. Patient instructed to remain in clinic for 20 minutes afterwards, and to report any adverse reaction to me immediately.

## 2024-07-17 ENCOUNTER — Ambulatory Visit
Admission: RE | Admit: 2024-07-17 | Discharge: 2024-07-17 | Disposition: A | Discharge status: Home / Self Care | Source: Ambulatory Visit | Attending: Primary Care | Admitting: Primary Care

## 2024-07-17 DIAGNOSIS — R053 Chronic cough: Secondary | ICD-10-CM

## 2024-07-17 DIAGNOSIS — A310 Pulmonary mycobacterial infection: Secondary | ICD-10-CM

## 2024-07-20 NOTE — Telephone Encounter (Signed)
 Medical [employer] and [employer] Stable - Prior Authorization NOT required for [employer]

## 2024-07-20 NOTE — Telephone Encounter (Signed)
 Last Prolia  inj 07/12/24 Next Prolia  inj due 01/10/25

## 2024-07-30 ENCOUNTER — Ambulatory Visit: Payer: Self-pay | Admitting: Primary Care

## 2024-07-30 NOTE — Progress Notes (Signed)
 CT chest showed prior lobectomy with stable scarring. Incidental findings coronary artery disease and enlarged pulmonary artery indicative of pulm hypertension. Keep apt with Dr. Shelah on 2/12 to discuss non-urgent findings

## 2024-07-31 ENCOUNTER — Ambulatory Visit: Admitting: Infectious Disease

## 2024-08-05 ENCOUNTER — Encounter: Admitting: Internal Medicine

## 2024-08-21 ENCOUNTER — Ambulatory Visit (HOSPITAL_BASED_OUTPATIENT_CLINIC_OR_DEPARTMENT_OTHER)

## 2024-08-22 ENCOUNTER — Ambulatory Visit: Admitting: Emergency Medicine

## 2024-08-27 ENCOUNTER — Ambulatory Visit: Admitting: Infectious Disease

## 2024-11-04 ENCOUNTER — Inpatient Hospital Stay: Admitting: Adult Health

## 2024-11-04 ENCOUNTER — Inpatient Hospital Stay

## 2025-01-17 ENCOUNTER — Ambulatory Visit

## 2025-03-04 ENCOUNTER — Ambulatory Visit: Admitting: Internal Medicine

## 2025-07-01 ENCOUNTER — Other Ambulatory Visit

## 2025-07-07 ENCOUNTER — Ambulatory Visit: Admitting: Internal Medicine
